# Patient Record
Sex: Female | Born: 1950
Health system: Southern US, Community
[De-identification: ages and names within clinical notes are randomized; demographics above are authoritative.]

## PROBLEM LIST (undated history)

## (undated) DIAGNOSIS — I2510 Atherosclerotic heart disease of native coronary artery without angina pectoris: Secondary | ICD-10-CM

## (undated) DIAGNOSIS — I739 Peripheral vascular disease, unspecified: Secondary | ICD-10-CM

## (undated) DIAGNOSIS — T4145XA Adverse effect of unspecified anesthetic, initial encounter: Secondary | ICD-10-CM

## (undated) DIAGNOSIS — E785 Hyperlipidemia, unspecified: Secondary | ICD-10-CM

## (undated) DIAGNOSIS — M79606 Pain in leg, unspecified: Secondary | ICD-10-CM

## (undated) DIAGNOSIS — IMO0001 Reserved for inherently not codable concepts without codable children: Secondary | ICD-10-CM

## (undated) DIAGNOSIS — Z720 Tobacco use: Secondary | ICD-10-CM

## (undated) DIAGNOSIS — I6529 Occlusion and stenosis of unspecified carotid artery: Secondary | ICD-10-CM

## (undated) DIAGNOSIS — Z87442 Personal history of urinary calculi: Secondary | ICD-10-CM

## (undated) DIAGNOSIS — I509 Heart failure, unspecified: Secondary | ICD-10-CM

## (undated) DIAGNOSIS — N2889 Other specified disorders of kidney and ureter: Secondary | ICD-10-CM

## (undated) DIAGNOSIS — D649 Anemia, unspecified: Secondary | ICD-10-CM

## (undated) DIAGNOSIS — J449 Chronic obstructive pulmonary disease, unspecified: Secondary | ICD-10-CM

## (undated) DIAGNOSIS — I10 Essential (primary) hypertension: Secondary | ICD-10-CM

## (undated) DIAGNOSIS — K219 Gastro-esophageal reflux disease without esophagitis: Secondary | ICD-10-CM

## (undated) DIAGNOSIS — R51 Headache: Secondary | ICD-10-CM

## (undated) DIAGNOSIS — T8859XA Other complications of anesthesia, initial encounter: Secondary | ICD-10-CM

## (undated) DIAGNOSIS — I340 Nonrheumatic mitral (valve) insufficiency: Secondary | ICD-10-CM

## (undated) DIAGNOSIS — C3491 Malignant neoplasm of unspecified part of right bronchus or lung: Secondary | ICD-10-CM

## (undated) HISTORY — DX: Tobacco use: Z72.0

## (undated) HISTORY — DX: Essential (primary) hypertension: I10

## (undated) HISTORY — DX: Hyperlipidemia, unspecified: E78.5

## (undated) HISTORY — DX: Heart failure, unspecified: I50.9

## (undated) HISTORY — DX: Headache: R51

## (undated) HISTORY — DX: Occlusion and stenosis of unspecified carotid artery: I65.29

## (undated) HISTORY — PX: ABDOMINAL HYSTERECTOMY: SHX81

## (undated) HISTORY — DX: Atherosclerotic heart disease of native coronary artery without angina pectoris: I25.10

## (undated) HISTORY — PX: RENAL ARTERY STENT: SHX2321

## (undated) HISTORY — DX: Malignant neoplasm of unspecified part of right bronchus or lung: C34.91

## (undated) HISTORY — DX: Pain in leg, unspecified: M79.606

## (undated) HISTORY — DX: Chronic obstructive pulmonary disease, unspecified: J44.9

## (undated) HISTORY — DX: Gastro-esophageal reflux disease without esophagitis: K21.9

## (undated) HISTORY — PX: COLONOSCOPY W/ POLYPECTOMY: SHX1380

## (undated) HISTORY — DX: Peripheral vascular disease, unspecified: I73.9

---

## 1999-04-20 ENCOUNTER — Emergency Department (HOSPITAL_COMMUNITY): Admission: EM | Admit: 1999-04-20 | Discharge: 1999-04-20 | Payer: Self-pay | Admitting: Emergency Medicine

## 1999-04-24 ENCOUNTER — Emergency Department (HOSPITAL_COMMUNITY): Admission: EM | Admit: 1999-04-24 | Discharge: 1999-04-25 | Payer: Self-pay | Admitting: Internal Medicine

## 2004-08-01 ENCOUNTER — Emergency Department: Payer: Self-pay | Admitting: Emergency Medicine

## 2005-06-13 ENCOUNTER — Ambulatory Visit: Payer: Self-pay | Admitting: Family Medicine

## 2006-02-07 ENCOUNTER — Ambulatory Visit: Payer: Self-pay | Admitting: Gastroenterology

## 2006-07-08 ENCOUNTER — Ambulatory Visit: Payer: Self-pay | Admitting: Obstetrics and Gynecology

## 2007-08-24 HISTORY — PX: CORONARY ANGIOPLASTY WITH STENT PLACEMENT: SHX49

## 2007-08-29 ENCOUNTER — Inpatient Hospital Stay (HOSPITAL_COMMUNITY): Admission: EM | Admit: 2007-08-29 | Discharge: 2007-08-31 | Payer: Self-pay | Admitting: Emergency Medicine

## 2007-09-10 ENCOUNTER — Inpatient Hospital Stay (HOSPITAL_COMMUNITY): Admission: AD | Admit: 2007-09-10 | Discharge: 2007-09-11 | Payer: Self-pay | Admitting: Interventional Cardiology

## 2007-09-10 ENCOUNTER — Inpatient Hospital Stay (HOSPITAL_BASED_OUTPATIENT_CLINIC_OR_DEPARTMENT_OTHER): Admission: RE | Admit: 2007-09-10 | Discharge: 2007-09-10 | Payer: Self-pay | Admitting: Cardiology

## 2007-10-01 ENCOUNTER — Ambulatory Visit (HOSPITAL_COMMUNITY): Admission: RE | Admit: 2007-10-01 | Discharge: 2007-10-02 | Payer: Self-pay | Admitting: Interventional Cardiology

## 2007-10-08 ENCOUNTER — Emergency Department (HOSPITAL_COMMUNITY): Admission: EM | Admit: 2007-10-08 | Discharge: 2007-10-08 | Payer: Self-pay | Admitting: Emergency Medicine

## 2007-11-09 ENCOUNTER — Ambulatory Visit: Payer: Self-pay | Admitting: Vascular Surgery

## 2007-11-20 ENCOUNTER — Ambulatory Visit: Payer: Self-pay | Admitting: Vascular Surgery

## 2007-11-20 ENCOUNTER — Encounter: Payer: Self-pay | Admitting: Vascular Surgery

## 2007-11-20 ENCOUNTER — Observation Stay (HOSPITAL_COMMUNITY): Admission: RE | Admit: 2007-11-20 | Discharge: 2007-11-21 | Payer: Self-pay | Admitting: Obstetrics and Gynecology

## 2007-11-21 HISTORY — PX: CAROTID ENDARTERECTOMY: SUR193

## 2007-12-08 ENCOUNTER — Ambulatory Visit: Payer: Self-pay | Admitting: Vascular Surgery

## 2008-03-25 HISTORY — PX: ANGIOPLASTY / STENTING ILIAC: SUR31

## 2008-06-14 ENCOUNTER — Ambulatory Visit: Payer: Self-pay | Admitting: Vascular Surgery

## 2008-09-28 ENCOUNTER — Encounter (INDEPENDENT_AMBULATORY_CARE_PROVIDER_SITE_OTHER): Payer: Self-pay | Admitting: *Deleted

## 2008-09-28 DIAGNOSIS — E785 Hyperlipidemia, unspecified: Secondary | ICD-10-CM

## 2008-09-28 DIAGNOSIS — Z9861 Coronary angioplasty status: Secondary | ICD-10-CM

## 2008-09-28 DIAGNOSIS — I739 Peripheral vascular disease, unspecified: Secondary | ICD-10-CM

## 2008-09-28 DIAGNOSIS — I10 Essential (primary) hypertension: Secondary | ICD-10-CM

## 2008-09-28 DIAGNOSIS — I2510 Atherosclerotic heart disease of native coronary artery without angina pectoris: Secondary | ICD-10-CM

## 2008-10-19 ENCOUNTER — Ambulatory Visit: Payer: Self-pay | Admitting: Family Medicine

## 2008-10-20 ENCOUNTER — Telehealth: Payer: Self-pay | Admitting: *Deleted

## 2008-10-24 ENCOUNTER — Ambulatory Visit: Payer: Self-pay | Admitting: Family Medicine

## 2008-10-24 ENCOUNTER — Encounter: Payer: Self-pay | Admitting: Family Medicine

## 2008-10-24 LAB — CONVERTED CEMR LAB
BUN: 13 mg/dL (ref 6–23)
CO2: 24 meq/L (ref 19–32)
Calcium: 10 mg/dL (ref 8.4–10.5)
Chloride: 109 meq/L (ref 96–112)
Cholesterol: 163 mg/dL (ref 0–200)
Creatinine, Ser: 0.98 mg/dL (ref 0.40–1.20)
Glucose, Bld: 88 mg/dL (ref 70–99)
HDL: 43 mg/dL (ref >39)
LDL Cholesterol: 97 mg/dL (ref 0–99)
Potassium: 3.8 meq/L (ref 3.5–5.3)
Sodium: 141 meq/L (ref 135–145)
Total CHOL/HDL Ratio: 3.8
Triglycerides: 116 mg/dL (ref <150)
VLDL: 23 mg/dL (ref 0–40)
Vit D, 25-Hydroxy: 17 ng/mL — ABNORMAL LOW (ref 30–89)

## 2008-10-25 ENCOUNTER — Encounter: Admission: RE | Admit: 2008-10-25 | Discharge: 2008-10-25 | Payer: Self-pay | Admitting: Family Medicine

## 2008-11-07 ENCOUNTER — Telehealth: Payer: Self-pay | Admitting: Family Medicine

## 2008-12-20 ENCOUNTER — Ambulatory Visit: Payer: Self-pay | Admitting: Vascular Surgery

## 2009-06-12 ENCOUNTER — Ambulatory Visit: Payer: Self-pay | Admitting: Family Medicine

## 2009-06-19 ENCOUNTER — Encounter (INDEPENDENT_AMBULATORY_CARE_PROVIDER_SITE_OTHER): Payer: Self-pay | Admitting: *Deleted

## 2009-06-20 ENCOUNTER — Encounter: Payer: Self-pay | Admitting: Family Medicine

## 2009-07-21 ENCOUNTER — Encounter (INDEPENDENT_AMBULATORY_CARE_PROVIDER_SITE_OTHER): Payer: Self-pay | Admitting: *Deleted

## 2009-09-21 ENCOUNTER — Ambulatory Visit: Payer: Self-pay | Admitting: Internal Medicine

## 2009-11-10 ENCOUNTER — Telehealth: Payer: Self-pay | Admitting: Family Medicine

## 2009-11-22 ENCOUNTER — Encounter: Payer: Self-pay | Admitting: Family Medicine

## 2009-11-22 ENCOUNTER — Ambulatory Visit: Payer: Self-pay | Admitting: Family Medicine

## 2009-11-22 LAB — CONVERTED CEMR LAB
HCT: 36.4 % (ref 36.0–46.0)
Hemoglobin: 11.8 g/dL — ABNORMAL LOW (ref 12.0–15.0)
MCHC: 32.4 g/dL (ref 30.0–36.0)
MCV: 84.8 fL (ref 78.0–100.0)
Platelets: 182 10*3/uL (ref 150–400)
RBC: 4.29 M/uL (ref 3.87–5.11)
RDW: 15.1 % (ref 11.5–15.5)
TSH: 1.81 microintl units/mL (ref 0.350–4.500)
WBC: 11.9 10*3/uL — ABNORMAL HIGH (ref 4.0–10.5)

## 2009-11-23 ENCOUNTER — Encounter: Payer: Self-pay | Admitting: Family Medicine

## 2009-12-05 ENCOUNTER — Encounter: Payer: Self-pay | Admitting: *Deleted

## 2009-12-05 ENCOUNTER — Ambulatory Visit: Payer: Self-pay | Admitting: Vascular Surgery

## 2010-03-01 ENCOUNTER — Telehealth: Payer: Self-pay | Admitting: Family Medicine

## 2010-03-06 ENCOUNTER — Telehealth: Payer: Self-pay | Admitting: Family Medicine

## 2010-04-15 ENCOUNTER — Encounter: Payer: Self-pay | Admitting: Family Medicine

## 2010-04-15 ENCOUNTER — Encounter: Payer: Self-pay | Admitting: Cardiology

## 2010-04-24 NOTE — Cardiovascular Report (Signed)
Summary: [facility]  [facility]   Imported By: Bradly Bienenstock 06/23/2009 15:53:28  _____________________________________________________________________  External Attachment:    Type:   Image     Comment:   External Document

## 2010-04-24 NOTE — Assessment & Plan Note (Signed)
Summary: new pt appt/AC   Vital Signs:  Patient profile:   60 year old female Height:      74.0 inches Weight:      195.8 pounds BMI:     25.23 Temp:     97.6 degrees F Pulse rate:   56 / minute BP sitting:   173 / 84  (left arm)  Vitals Entered By: Ardeen Garland  MD (October 19, 2008 10:25 AM) CC: np Is Patient Diabetic? No Pain Assessment Patient in pain? no        Primary Care Provider:  Helane Rima DO  CC:  np.  History of Present Illness: Leah [name] is a pleasant 60 year old AAF presenting for a new patient visit. Her chronic medical issues are as follows:  1. HTN: Rx: metoprolol, benicar, asa. BP elevated today at 173/84. she denies CP, SOB, N/V, HA, dizziness, vision changes, LE swelling. wonders if elevated BP due to back pain.  2. HLD: Rx: lipitor. FLP 10/24/08. chol 163, trig 116, HDL 43, LDL 97.  3. CAD: s/p PTCA, stenting of LAD and first diagonal 09/2007. no PHX MI. Rx: ASA, Pplavix  4. PVD: s/p bilateral renal artery stenting, right external iliac artery stenting, left carotid endarterectomy. Rx: ASA, Plavix  5. Tobacco Use: smokes 10 cig/day. has smoked since age 38. sometimes up to 1.5 ppd.  6. Health Maintenance: mammogram x 2 years ago, colonoscopy x 2 years ago at [facility] (+ polyps, due next year), pelvic exam last year (+ trichomonas, patient and partner treated), DEXA (s/p total hysterectomy in 1980s with no HRT), PAP not indicated.  Her acute issue today:  1. Back Pain: x 4-5 months, low back with burning down both legs when lying flat on back. She denies trauma, bowel or bladder incontinence, shooting pain down legs, leg weakness, PMHx of chronic steriod use, FamHx of osteoporosis. She endorses PMHx of total hysterectomy in 1980s with no HRT. The patient also works long hours standing/walking on cement floor without proper footwear. She has some relief with Exedrin for pain.    Habits & Providers  Alcohol-Tobacco-Diet     Tobacco  Status: current     Cigarette Packs/Day: 0.5  Comments: Previous PCP: Dr. Shelbie Hutching; records at [facility]  Current Medications (verified): 1)  Metoprolol Tartrate 25 Mg Tabs (Metoprolol Tartrate) .... Take 1 Tablet By Mouth Two Times A Day 2)  Lipitor 40 Mg Tabs (Atorvastatin Calcium) .... Take 1 Tablet By Mouth Once Daily 3)  Benicar Hct 20-12.5 Mg Tabs (Olmesartan Medoxomil-Hctz) .... Take 1 Tablet By Mouth Once Daily 4)  Bayer Aspirin 325 Mg Tabs (Aspirin) .... Take 1 Tablet By Mouth Once Daily 5)  Nitroglycerin 0.4 Mg Subl (Nitroglycerin) .... Use As Directed, As Needed 6)  Plavix 75 Mg Tabs (Clopidogrel Bisulfate) .... Take 1 Tab By Mouth Daily  Allergies (verified): No Known Drug Allergies  Past History:  Past Medical History: Colonic polyps, hx of - 2006 Coronary artery disease - stents placement 2009 Hyperlipidemia Hypertension Peripheral vascular disease-  right ext. iliac artery stent placement Stent placement both kidneys Left carotid endarterectomy 2010  Past Surgical History: Caesarean section - 1986 Colon polypectomy - 2006 Cardiac angioplasty with stent placemen t- 2009 Hysterectomy due to fibroids/bleeding- 1980s PMH-FH-SH reviewed-no changes except otherwise noted  Social History: Pt lives with fiance in [place], works full time as Engineer, mining for [employer].  Smokes  ~10 cigarettes per day (has smoked up to 1.5 ppd since age 17),  drinks 2-3 alcoholic beverages occassionally on weekends, uses no illicit drugs. She has 4 children.Smoking Status:  current Packs/Day:  0.5  Review of Systems       per HPI, otherwise negative  Physical Exam  General:  alert, well-developed, well-nourished, and well-hydrated.   Lungs:  CTAB Heart:  RRR no m/r/g Msk:  ttp along spinus processes lumbar spine with hypertonic paraspinal mm, negative SLR bilaterally, normal strength and reflexes LE. no s/s reproduced with extension. some ttp SI joints. Pulses:  2+  dp Extremities:  no edema Neurologic:  strength normal in all extremities and gait normal.   Psych:  Oriented X3, memory intact for recent and remote, normally interactive, good eye contact, and not anxious appearing.     Impression & Recommendations:  Problem # 1:  BACK PAIN (ICD-724.5) Assessment New No RED FLAGS noted. Still considering back pain acute flare, likely MSK in nature, but there may be a OA component. Will Rx: Tylenol scheduled with Naproxyn for breakthrough pain. Advised light exercise and stretching as well as proper footwear during work. Will check DEXA and vitamin D as patient  > 15 years out of total hysterectomy with no HRT. Follow up in 3-4 weeks if no improvement. Her updated medication list for this problem includes:    Bayer Aspirin 325 Mg Tabs (Aspirin) .Marland Kitchen... Take 1 tablet by mouth once daily  Orders: Dexa scan (Dexa scan)Future Orders: Vit D, 25 [facility] ([id number]) ... 10/24/2008  Problem # 2:  HYPERTENSION (ICD-401.9) Assessment: Deteriorated No change to medications today. Unclear if elevated BP due to pain. Will have patient return for recheck. Will also work on changing medicines to $4 plan.  Her updated medication list for this problem includes:    Metoprolol Tartrate 25 Mg Tabs (Metoprolol tartrate) .Marland Kitchen... Take 1 tablet by mouth two times a day    Benicar Hct 20-12.5 Mg Tabs (Olmesartan medoxomil-hctz) .Marland Kitchen... Take 1 tablet by mouth once daily  Future Orders: Basic Met-[facility] ([id number]) ... 10/24/2008  Problem # 3:  HYPERLIPIDEMIA (ICD-272.4) Assessment: Unchanged Will recheck FLP.  Her updated medication list for this problem includes:    Lipitor 40 Mg Tabs (Atorvastatin calcium) .Marland Kitchen... Take 1 tablet by mouth once daily  Problem # 4:  CORONARY ARTERY DISEASE (ICD-414.00) Assessment: Unchanged Continue current treatment. Encouraged smoking cessation. Her updated medication list for this problem includes:    Metoprolol Tartrate 25 Mg Tabs  (Metoprolol tartrate) .Marland Kitchen... Take 1 tablet by mouth two times a day    Benicar Hct 20-12.5 Mg Tabs (Olmesartan medoxomil-hctz) .Marland Kitchen... Take 1 tablet by mouth once daily    Bayer Aspirin 325 Mg Tabs (Aspirin) .Marland Kitchen... Take 1 tablet by mouth once daily    Nitroglycerin 0.4 Mg Subl (Nitroglycerin) ..... Use as directed, as needed    Plavix 75 Mg Tabs (Clopidogrel bisulfate) .Marland Kitchen... Take 1 tab by mouth daily  Future Orders: Lipid-[facility] ([id number]) ... 10/24/2008  Problem # 5:  PERIPHERAL VASCULAR DISEASE (ICD-443.9) Assessment: Unchanged Continue current treatment. Her updated medication list for this problem includes:    Plavix 75 Mg Tabs (Clopidogrel bisulfate) .Marland Kitchen... Take 1 tab by mouth daily  Problem # 6:  SMOKER (ICD-305.1) Assessment: Unchanged Encouraged cessation. Patient not ready to quit at this time. Will continue to monitor and encourage.  Complete Medication List: 1)  Metoprolol Tartrate 25 Mg Tabs (Metoprolol tartrate) .... Take 1 tablet by mouth two times a day 2)  Lipitor 40 Mg Tabs (Atorvastatin calcium) .... Take 1 tablet by mouth once daily 3)  Benicar Hct 20-12.5 Mg Tabs (Olmesartan medoxomil-hctz) .... Take 1 tablet by mouth once daily 4)  Bayer Aspirin 325 Mg Tabs (Aspirin) .... Take 1 tablet by mouth once daily 5)  Nitroglycerin 0.4 Mg Subl (Nitroglycerin) .... Use as directed, as needed 6)  Plavix 75 Mg Tabs (Clopidogrel bisulfate) .... Take 1 tab by mouth daily  Patient Instructions: 1)  It was great to meet you today! 2)  We will send you to get a bone scan to evaluate for osteoporosis. 3)  We will request records from your previous PCP. 4)  Take Tylenol Extra Strenth (1000mg ) three times daily scheduled. 5)  You may also take Naproxyn for pain. 6)  Let me know if you do not feel better in 3-4 weeks. Prescriptions: BENICAR HCT 20-12.5 MG TABS (OLMESARTAN MEDOXOMIL-HCTZ) take 1 tablet by mouth once daily  #30 x 0   Entered and Authorized by:   Helane Rima MD    Signed by:   Helane Rima MD on 10/20/2008   Method used:   Electronically to        [facility]  [phone]* (retail)       [address]       [place], [place]  [address]       Ph: [phone] or [phone]       Fax: [phone]   RxID:   [id number]   Appended Document: Orders Update    Clinical Lists Changes  Medications: Changed medication from BENICAR HCT 20-12.5 MG TABS (OLMESARTAN MEDOXOMIL-HCTZ) take 1 tablet by mouth once daily to LISINOPRIL-HYDROCHLOROTHIAZIDE 20-12.5 MG TABS (LISINOPRIL-HYDROCHLOROTHIAZIDE) one by mouth daily - Signed Changed medication from LIPITOR 40 MG TABS (ATORVASTATIN CALCIUM) take 1 tablet by mouth once daily to PRAVASTATIN SODIUM 40 MG TABS (PRAVASTATIN SODIUM) one by mouth daily - Signed Rx of LISINOPRIL-HYDROCHLOROTHIAZIDE 20-12.5 MG TABS (LISINOPRIL-HYDROCHLOROTHIAZIDE) one by mouth daily;  #30 x 3;  Signed;  Entered by: Helane Rima MD;  Authorized by: Helane Rima MD;  Method used: Historical Rx of PRAVASTATIN SODIUM 40 MG TABS (PRAVASTATIN SODIUM) one by mouth daily;  #30 x 3;  Signed;  Entered by: Helane Rima MD;  Authorized by: Helane Rima MD;  Method used: Historical    Prescriptions: PRAVASTATIN SODIUM 40 MG TABS (PRAVASTATIN SODIUM) one by mouth daily  #30 x 3   Entered and Authorized by:   Helane Rima MD   Signed by:   Helane Rima MD on 11/02/2008   Method used:   Historical   RxID:   [id number] LISINOPRIL-HYDROCHLOROTHIAZIDE 20-12.5 MG TABS (LISINOPRIL-HYDROCHLOROTHIAZIDE) one by mouth daily  #30 x 3   Entered and Authorized by:   Helane Rima MD   Signed by:   Helane Rima MD on 11/02/2008   Method used:   Historical   RxID:   [id number]    Appended Document: new pt appt/AC Patient requested that medications be changed to $4 [facility] medications. Pravastatin to replace Lipitor. Lisinopril/HCTZ to replace Benicar/HCTZ.

## 2010-04-24 NOTE — Letter (Signed)
Summary: [facility]  [address] [place], [place] [address]   Phone: [phone]  Fax: [phone]    07/21/2009  [facility] [address] [place], [place]  [address] DOB: [birth year]  To:   [facility]     Health Information Dept.   [phone] (f)  Please fax any medical records pertaining to Colonoscopy and/or EGD and any related pathology.  Patient states these records would be between 2005-2011.  Please fax records to [phone], Attn: Stephanie.  Sincerely,    Francee Piccolo CMA [facility]) [phone] Extention

## 2010-04-24 NOTE — Letter (Signed)
Summary: New Patient letter  [facility]  [address] [place], [place] [address]   Phone: [phone]  Fax: [phone]       06/19/2009 MRN: [id number]  [facility] [address] [place], [place]  [address]  Dear Ms. Barnabas Lister,  Welcome to the Gastroenterology Division at [facility].    You are scheduled to see Dr. Leone Payor on 07-24-09 at 9:00a.m. on the 3rd floor at [facility], [address]  We ask that you try to arrive at our office 15 minutes prior to your appointment time to allow for check-in.  We would like you to complete the enclosed self-administered evaluation form prior to your visit and bring it with you on the day of your appointment.  We will review it with you.  Also, please bring a complete list of all your medications or, if you prefer, bring the medication bottles and we will list them.  Please bring your insurance card so that we may make a copy of it.  If your insurance requires a referral to see a specialist, please bring your referral form from your primary care physician.  Co-payments are due at the time of your visit and may be paid by cash, check or credit card.     Your office visit will consist of a consult with your physician (includes a physical exam), any laboratory testing he/she may order, scheduling of any necessary diagnostic testing (e.g. x-ray, ultrasound, CT-scan), and scheduling of a procedure (e.g. Endoscopy, Colonoscopy) if required.  Please allow enough time on your schedule to allow for any/all of these possibilities.    If you cannot keep your appointment, please call [phone] to cancel or reschedule prior to your appointment date.  This allows [place] the opportunity to schedule an appointment for another patient in need of care.  If you do not cancel or reschedule by 5 p.m. the business day prior to your appointment date, you will be charged a $50.00 late cancellation/no-show fee.    Thank you for  choosing [facility] for your medical needs.  We appreciate the opportunity to care for you.  Please visit [place] at our website  to learn more about our practice.                     Sincerely,                                                             The Gastroenterology Division

## 2010-04-24 NOTE — Progress Notes (Signed)
Summary: refill  Phone Note Refill Request Call back at Home Phone [phone] Message from:  Patient  Refills Requested: Medication #1:  METOPROLOL TARTRATE 25 MG TABS Take 1 tablet by mouth two times a day  Medication #2:  PRAVASTATIN SODIUM 40 MG TABS one by mouth daily  Medication #3:  LISINOPRIL-HYDROCHLOROTHIAZIDE 20-12.5 MG TABS one by mouth daily  Medication #4:  PLAVIX 75 MG TABS Take 1 tab by mouth daily. p states that pharmacy has been trying to reach [place]. she is now out of meds.  Initial call taken by: De Nurse,  November 07, 2008 10:36 AM    Prescriptions: PLAVIX 75 MG TABS (CLOPIDOGREL BISULFATE) Take 1 tab by mouth daily  #30 x 11   Entered and Authorized by:   Helane Rima MD   Signed by:   Helane Rima MD on 11/07/2008   Method used:   Electronically to        [facility] (retail)       [address]       [place], [place]  [address]       Ph: [phone]       Fax: [phone]   RxID:   [id number] LISINOPRIL-HYDROCHLOROTHIAZIDE 20-12.5 MG TABS (LISINOPRIL-HYDROCHLOROTHIAZIDE) one by mouth daily  #90 x 3   Entered and Authorized by:   Helane Rima MD   Signed by:   Helane Rima MD on 11/07/2008   Method used:   Electronically to        [facility] (retail)       [address]       [place], [place]  [address]       Ph: [phone]       Fax: [phone]   RxID:   [id number] PRAVASTATIN SODIUM 40 MG TABS (PRAVASTATIN SODIUM) one by mouth daily  #90 x 3   Entered and Authorized by:   Helane Rima MD   Signed by:   Helane Rima MD on 11/07/2008   Method used:   Electronically to        [facility] (retail)       [address]       [place], [place]  [address]       Ph: [phone]       Fax: [phone]   RxID:   [id number] METOPROLOL TARTRATE 25 MG TABS (METOPROLOL TARTRATE) Take 1 tablet by mouth two times a day  #180 x 3  Entered and Authorized by:   Helane Rima MD   Signed by:   Helane Rima MD on 11/07/2008   Method used:   Electronically to        [facility] (retail)       [address]       [place], [place]  [address]       Ph: [phone]       Fax: [phone]   RxID:   [id number]

## 2010-04-24 NOTE — Assessment & Plan Note (Signed)
Summary: f/u labs,df   Vital Signs:  Patient profile:   60 year old female Weight:      182.2 pounds BMI:     23.48 Temp:     98.9 degrees F Pulse rate:   79 / minute BP sitting:   144 / 78  (left arm)  Vitals Entered By: Starleen Blue RN (November 22, 2009 1:35 PM) CC: Labs, Rash, Fatigue Is Patient Diabetic? No Pain Assessment Patient in pain? no        Primary Care Provider:  Helane Rima DO  CC:  Labs, Rash, and Fatigue.  History of Present Illness: 60 yo F, that had labwork done at work earlier this month, presenting to discuss the results.  1. Labs: See Flowsheet. Fasting BG 101 with A1c 6.2. Discussed preDM. Patient okay with making appointment to see nutritionist with PCP available.  2. Rash: right antecubital fossa, x 3-4 days, no other places, no Hx eczema.  3. Fatigue: x several months, statest that she has no "get up and go." See ROS.  Habits & Providers  Alcohol-Tobacco-Diet     Tobacco Status: current     Cigarette Packs/Day: 0.5  Current Medications (verified): 1)  Metoprolol Tartrate 25 Mg Tabs (Metoprolol Tartrate) .... Take 1 Tablet By Mouth Two Times A Day 2)  Pravastatin Sodium 40 Mg Tabs (Pravastatin Sodium) .... One By Mouth Daily 3)  Lisinopril-Hydrochlorothiazide 20-12.5 Mg Tabs (Lisinopril-Hydrochlorothiazide) .... One By Mouth Daily 4)  Bayer Aspirin 325 Mg Tabs (Aspirin) .... Take 1 Tablet By Mouth Once Daily 5)  Nitroglycerin 0.4 Mg Subl (Nitroglycerin) .... Use As Directed, As Needed 6)  Plavix 75 Mg Tabs (Clopidogrel Bisulfate) .... Take 1 Tab By Mouth Daily 7)  Ultram 50 Mg Tabs (Tramadol Hcl) .... One By Mouth Two Times A Day As Needed Moderate Pain 8)  Triamcinolone Acetonide 0.5 % Crea (Triamcinolone Acetonide) .... Apply Bid To Affected Area  Allergies (verified): No Known Drug Allergies PMH-FH-SH reviewed for relevance  Review of Systems General:  Denies chills and fever. CV:  Denies chest pain or discomfort, shortness of  breath with exertion, and swelling of feet. GI:  Denies abdominal pain, constipation, diarrhea, nausea, and vomiting. Derm:  Complains of dryness, itching, and rash; denies lesion(s). Psych:  Denies anxiety and depression. Endo:  Denies cold intolerance, excessive hunger, excessive thirst, excessive urination, heat intolerance, and weight change. Heme:  Denies bleeding.  Physical Exam  General:  Alert, well-developed, well-nourished, and well-hydrated.  Vitals reviewed. Lungs:  CTAB, no wheeze. Heart:  RRR no m/r/g. Abdomen:  Bowel sounds positive,abdomen soft and non-tender without masses, organomegaly or hernias noted. Extremities:  No edema. Skin:  Right antecubital fossa with dry patch, with multiple excoriations. No open lesions.  Psych:  Oriented X3, memory intact for recent and remote, normally interactive, good eye contact, not anxious appearing, and not depressed appearing.     Impression & Recommendations:  Problem # 1:  PREDIABETES (ICD-790.29) Assessment New  Discussed Dx. Patient agreed to follow up with Dr. Gerilyn Pilgrim. I will be working with her next month, so we will schedule at time for the 3 of [place] to meet.  Orders: [facility]- Est  Level 4 ([address])  Problem # 2:  SKIN RASH (ICD-782.1) Assessment: New  Unclear etiology. Will treat with below. Her updated medication list for this problem includes:    Triamcinolone Acetonide 0.5 % Crea (Triamcinolone acetonide) .Marland Kitchen... Apply bid to affected area  Orders: [facility]- Est  Level 4 ([address])  Problem # 3:  FATIGUE (ICD-780.79) Assessment: Unchanged  Orders: CBC-[facility] ([address]) TSH-[facility] ([id number]) [facility]- Est  Level 4 ([address])  Complete Medication List: 1)  Metoprolol Tartrate 25 Mg Tabs (Metoprolol tartrate) .... Take 1 tablet by mouth two times a day 2)  Pravastatin Sodium 40 Mg Tabs (Pravastatin sodium) .... One by mouth daily 3)  Lisinopril-hydrochlorothiazide 20-12.5 Mg Tabs (Lisinopril-hydrochlorothiazide) .... One by mouth  daily 4)  Bayer Aspirin 325 Mg Tabs (Aspirin) .... Take 1 tablet by mouth once daily 5)  Nitroglycerin 0.4 Mg Subl (Nitroglycerin) .... Use as directed, as needed 6)  Plavix 75 Mg Tabs (Clopidogrel bisulfate) .... Take 1 tab by mouth daily 7)  Ultram 50 Mg Tabs (Tramadol hcl) .... One by mouth two times a day as needed moderate pain 8)  Triamcinolone Acetonide 0.5 % Crea (Triamcinolone acetonide) .... Apply bid to affected area  Patient Instructions: 1)  It was nice to see you today! 2)  I have sent your prescriptions to the pharmacy. 3)  We will call with your Nutritionist Appointment. Prescriptions: PLAVIX 75 MG TABS (CLOPIDOGREL BISULFATE) Take 1 tab by mouth daily  #90 x 3   Entered and Authorized by:   Helane Rima DO   Signed by:   Helane Rima DO on 11/22/2009   Method used:   Print then Give to Patient   RxID:   [id number] TRIAMCINOLONE ACETONIDE 0.5 % CREA (TRIAMCINOLONE ACETONIDE) apply bid to affected area  #1 small tube x 0   Entered and Authorized by:   Helane Rima DO   Signed by:   Helane Rima DO on 11/22/2009   Method used:   Electronically to        [facility] (retail)       [address]       [place], [place]  [address]       Ph: [phone]       Fax: [phone]   RxID:   [id number]

## 2010-04-24 NOTE — Progress Notes (Signed)
Summary: refill  Phone Note Refill Request Call back at [phone] Message from:  Patient  Refills Requested: Medication #1:  LISINOPRIL-HYDROCHLOROTHIAZIDE 20-12.5 MG TABS one by mouth daily  Medication #2:  ULTRAM 50 MG TABS ONE by mouth two times a day as needed moderate pain Initial call taken by: De Nurse,  November 10, 2009 11:13 AM    Prescriptions: ULTRAM 50 MG TABS (TRAMADOL HCL) ONE by mouth two times a day as needed moderate pain  #60 x 1   Entered and Authorized by:   Helane Rima DO   Signed by:   Helane Rima DO on 11/13/2009   Method used:   Electronically to        [facility] (retail)       [address]       [place], [place]  [address]       Ph: [phone]       Fax: [phone]   RxID:   [id number] LISINOPRIL-HYDROCHLOROTHIAZIDE 20-12.5 MG TABS (LISINOPRIL-HYDROCHLOROTHIAZIDE) one by mouth daily  #90 x 3   Entered and Authorized by:   Helane Rima DO   Signed by:   Helane Rima DO on 11/13/2009   Method used:   Electronically to        [facility] (retail)       [address]       [place], [place]  [address]       Ph: [phone]       Fax: [phone]   RxID:   [id number]

## 2010-04-24 NOTE — Progress Notes (Signed)
  Phone Note Refill Request   Refills Requested: Medication #1:  ULTRAM 50 MG TABS ONE by mouth two times a day as needed moderate pain Need refill on Tramadol rx  Initial call taken by: Abundio Miu,  March 01, 2010 3:12 PM    Prescriptions: ULTRAM 50 MG TABS (TRAMADOL HCL) ONE by mouth two times a day as needed moderate pain  #60 x 1   Entered and Authorized by:   Helane Rima DO   Signed by:   Helane Rima DO on 03/01/2010   Method used:   Electronically to        [facility] (retail)       [address]       [place], [place]  [address]       Ph: [phone]       Fax: [phone]   RxID:   [id number]

## 2010-04-24 NOTE — Miscellaneous (Signed)
Summary: need to schedule nutrition appointment  Clinical Lists Changes  message left on voicemail to call back. need to schedule appointment with Dr. Gerilyn Pilgrim for any Thursday this month per order form Dr. Earlene Plater. Theresia Lo RN  December 05, 2009 9:06 AM  Pt returned call and made appt with Dr. Gerilyn Pilgrim for 12/07/09.

## 2010-04-24 NOTE — Letter (Signed)
Summary: Generic Letter  [facility]  [address]   [place], [place] [address]   Phone: [phone]  Fax: [phone]    11/23/2009  Leah [name] [address] [place], [place]  [address]  Dear Ms. HOLLICK,  I am happy to inform you that your recent labs were all normal.   Sincerely,   Helane Rima DO  Appended Document: Generic Letter mailed

## 2010-04-24 NOTE — Assessment & Plan Note (Signed)
Summary: back pain/eo   Vital Signs:  Patient profile:   60 year old female Height:      74.0 inches Weight:      186.9 pounds BMI:     24.08 Pulse rate:   75 / minute BP sitting:   156 / 81  (left arm)  Vitals Entered By: Arlyss Repress CMA, (June 12, 2009 1:37 PM) CC: f/up low back pain and bone density test Is Patient Diabetic? No Pain Assessment Patient in pain? yes     Location: lower back Intensity: 7 Onset of pain  x several months   Primary Care Provider:  Helane Rima DO  CC:  f/up low back pain and bone density test.  History of Present Illness: 60 year old AAF:  1. Back Pain: x several months, low back with burning down both legs when lying flat on back. She denies trauma, bowel or bladder incontinence, shooting pain down legs, leg weakness, PMHx of chronic steriod use, FamHx of osteoporosis. She endorses PMHx of total hysterectomy in 1980s with no HRT. The patient also works long hours standing/walking on cement floor without proper footwear. She has mild relief with Tylenol for pain. Recent labs - vitamin D 17. Bone density show low bone density but no osteoporosis.  Habits & Providers  Alcohol-Tobacco-Diet     Tobacco Status: current     Tobacco Counseling: to quit use of tobacco products  Current Medications (verified): 1)  Metoprolol Tartrate 25 Mg Tabs (Metoprolol Tartrate) .... Take 1 Tablet By Mouth Two Times A Day 2)  Pravastatin Sodium 40 Mg Tabs (Pravastatin Sodium) .... One By Mouth Daily 3)  Lisinopril-Hydrochlorothiazide 20-12.5 Mg Tabs (Lisinopril-Hydrochlorothiazide) .... One By Mouth Daily 4)  Bayer Aspirin 325 Mg Tabs (Aspirin) .... Take 1 Tablet By Mouth Once Daily 5)  Nitroglycerin 0.4 Mg Subl (Nitroglycerin) .... Use As Directed, As Needed 6)  Plavix 75 Mg Tabs (Clopidogrel Bisulfate) .... Take 1 Tab By Mouth Daily 7)  Ultram 50 Mg Tabs (Tramadol Hcl) .... One By Mouth Two Times A Day As Needed Moderate Pain 8)  Ergocalciferol 50000 Unit  Caps (Ergocalciferol) .... One By Mouth Weekly X 12 Weeks  Allergies (verified): No Known Drug Allergies  Past History:  Past Medical History: Colonic polyps, hx of - 2006 Coronary artery disease - stents placement 2009 Hyperlipidemia Hypertension Peripheral vascular disease - right ext. iliac artery stent placement Stent placement both kidneys Left carotid endarterectomy 2010 Low vitamin D DEXA (2010): - 1.4  Review of Systems General:  Denies chills and fever. CV:  Complains of shortness of breath with exertion; denies chest pain or discomfort and swelling of feet. Resp:  Denies cough, pleuritic, and wheezing. MS:  Complains of low back pain. Neuro:  Denies numbness and tingling.  Physical Exam  General:  Alert, well-developed, well-nourished, and well-hydrated.  Vitals reviewed. Lungs:  CTAB, no wheeze Heart:  RRR no m/r/g Msk:  ttp along spinus processes lumbar spine with hypertonic paraspinal mm, negative SLR bilaterally, normal strength and reflexes LE. no s/s reproduced with extension. some ttp SI joints. Extremities:  no edema Neurologic:  strength normal in all extremities and gait normal.   Psych:  Oriented X3, memory intact for recent and remote, normally interactive, good eye contact, and not anxious appearing.     Impression & Recommendations:  Problem # 1:  BACK PAIN (ICD-724.5) Assessment Unchanged No red flags. Reviewed importance of proper shoes while working. Will Rx Ulram for moderate - severe pain. Red Flags given.  Her updated medication list for this problem includes:    Bayer Aspirin 325 Mg Tabs (Aspirin) .Marland Kitchen... Take 1 tablet by mouth once daily    Ultram 50 Mg Tabs (Tramadol hcl) ..... One by mouth two times a day as needed moderate pain  Orders: [facility]- Est  Level 4 (84696)  Problem # 2:  VITAMIN D DEFICIENCY (ICD-268.9) Assessment: New High dose supplementation x 12 weeks. Orders: [facility]- Est  Level 4 (29528)  Problem # 3:  TOBACCO ABUSE  (ICD-305.1) Assessment: Unchanged Encouraged cessation. Orders: [facility]- Est  Level 4 (41324)  Complete Medication List: 1)  Metoprolol Tartrate 25 Mg Tabs (Metoprolol tartrate) .... Take 1 tablet by mouth two times a day 2)  Pravastatin Sodium 40 Mg Tabs (Pravastatin sodium) .... One by mouth daily 3)  Lisinopril-hydrochlorothiazide 20-12.5 Mg Tabs (Lisinopril-hydrochlorothiazide) .... One by mouth daily 4)  Bayer Aspirin 325 Mg Tabs (Aspirin) .... Take 1 tablet by mouth once daily 5)  Nitroglycerin 0.4 Mg Subl (Nitroglycerin) .... Use as directed, as needed 6)  Plavix 75 Mg Tabs (Clopidogrel bisulfate) .... Take 1 tab by mouth daily 7)  Ultram 50 Mg Tabs (Tramadol hcl) .... One by mouth two times a day as needed moderate pain 8)  Ergocalciferol 50000 Unit Caps (Ergocalciferol) .... One by mouth weekly x 12 weeks  Other Orders: Colonoscopy (Colon)  Patient Instructions: 1)  It was nice to see you today! 2)  I have sent your prescriptions to the pharmacy. 3)  We will call with your colonoscopy information. Prescriptions: ERGOCALCIFEROL 50000 UNIT CAPS (ERGOCALCIFEROL) one by mouth WEEKLY x 12 weeks  #12 x 0   Entered and Authorized by:   Helane Rima DO   Signed by:   Helane Rima DO on 06/12/2009   Method used:   Electronically to        [facility] (retail)       [address]       [place], [place]  [address]       Ph: [phone]       Fax: [phone]   RxID:   [id number] ULTRAM 50 MG TABS (TRAMADOL HCL) ONE by mouth two times a day as needed moderate pain  #60 x 3   Entered and Authorized by:   Helane Rima DO   Signed by:   Helane Rima DO on 06/12/2009   Method used:   Electronically to        [facility] (retail)       [address]       [place], [place]  [address]       Ph: [phone]       Fax: [phone]   RxID:   [id number]   Prevention & Chronic Care Immunizations   Influenza  vaccine: Not documented   Influenza vaccine deferral: Not indicated  (06/12/2009)    Tetanus booster: Not documented   Td booster deferral: Not indicated  (06/12/2009)    Pneumococcal vaccine: Not documented  Colorectal Screening   Hemoccult: Not documented   Hemoccult action/deferral: Not indicated  (06/12/2009)    Colonoscopy: Not documented   Colonoscopy action/deferral: GI Referral  (06/12/2009)  Other Screening   Pap smear: Not documented   Pap smear action/deferral: Not indicated S/P hysterectomy  (06/12/2009)    Mammogram: Not documented   Mammogram action/deferral: Ordered  (06/12/2009)   Smoking status: current  (06/12/2009)  Smoking cessation counseling: yes  (06/12/2009)  Lipids   Total Cholesterol: 163  (10/24/2008)   LDL: 97  (10/24/2008)   LDL Direct: Not documented   HDL: 43  (10/24/2008)   Triglycerides: 116  (10/24/2008)    SGOT (AST): Not documented   SGPT (ALT): Not documented   Alkaline phosphatase: Not documented   Total bilirubin: Not documented    Lipid flowsheet reviewed?: Yes   Progress toward LDL goal: At goal  Hypertension   Last Blood Pressure: 156 / 81  (06/12/2009)   Serum creatinine: 0.98  (10/24/2008)   Serum potassium 3.8  (10/24/2008)    Hypertension flowsheet reviewed?: Yes   Progress toward BP goal: Improved  Self-Management Support :   Personal Goals (by the next clinic visit) :      Personal blood pressure goal: 130/80  (06/12/2009)     Personal LDL goal: 70  (06/12/2009)    Patient will work on the following items until the next clinic visit to reach self-care goals:     Medications and monitoring: take my medicines every day  (06/12/2009)     Eating: drink diet soda or water instead of juice or soda, eat more vegetables, use fresh or frozen vegetables, eat foods that are low in salt, eat baked foods instead of fried foods, eat fruit for snacks and desserts  (06/12/2009)     Activity: take a 30 minute walk every day,  take the stairs instead of the elevator, park at the far end of the parking lot  (06/12/2009)    Hypertension self-management support: Written self-care plan  (06/12/2009)   Hypertension self-care plan printed.    Lipid self-management support: Written self-care plan  (06/12/2009)   Lipid self-care plan printed.   Nursing Instructions: Screening colonoscopy ordered

## 2010-04-26 NOTE — Progress Notes (Signed)
Summary: UPDATE PROBLEM LIST   

## 2010-05-18 ENCOUNTER — Encounter: Payer: Self-pay | Admitting: Family Medicine

## 2010-05-18 ENCOUNTER — Ambulatory Visit (INDEPENDENT_AMBULATORY_CARE_PROVIDER_SITE_OTHER): Payer: 59 | Admitting: Family Medicine

## 2010-05-18 VITALS — BP 108/67 | HR 68 | Wt 184.0 lb

## 2010-05-18 DIAGNOSIS — M545 Low back pain, unspecified: Secondary | ICD-10-CM

## 2010-05-18 MED ORDER — NORTRIPTYLINE HCL 10 MG PO CAPS: 10.0000 mg | ORAL_CAPSULE | Freq: Every day | ORAL | Status: DC

## 2010-05-18 MED ORDER — HYDROCODONE-ACETAMINOPHEN 7.5-500 MG PO TABS: 1.0000 | ORAL_TABLET | ORAL | Status: DC | PRN

## 2010-05-18 NOTE — Patient Instructions (Signed)
It was nice to see you today!  Follow up in 4-6 weeks if you are not feeling better or come in sooner if your feel worse.

## 2010-05-21 ENCOUNTER — Encounter: Payer: Self-pay | Admitting: Family Medicine

## 2010-05-21 NOTE — Progress Notes (Signed)
  Subjective:    Leah [name] is a 60 y.o. female who presents for evaluation of low back pain. The patient has had recurrent self limited episodes of low back pain in the past. Symptoms have been present for a few weeks and are unchanged.  Onset was related to / precipitated by no known injury. The pain is located in the across the lower back or radiating to left leg(s) and radiates to the left lower leg. The pain is described as aching and sharp and occurs intermittently. She rates her pain as moderate. Symptoms are exacerbated by flexion, lifting and twisting. Symptoms are improved by rest. She has also tried acetaminophen, change in body position, heat, NSAIDs and rest which provided no symptom relief. She has no other symptoms associated with the back pain. The patient has no "red flag" history indicative of complicated back pain.  The following portions of the patient's history were reviewed and updated as appropriate: past family history, past medical history, past social history and past surgical history.  Review of Systems Pertinent items are noted in HPI.    Objective:  VS Reviewed. General: NAD. A&O x 3.  Back Exam: Inspection: Lumbar lordosis. No other abnormalities. Motion: FROM. SLR seated: Negative.                     SLR lying: Negative. Palpable tenderness: Left lumbar paraspinal mm. No TTP along spinus processes. FABER: Negative. Sensory change: No. Reflex change: No.  Strength at foot: Plantar-flexion: 5 / 5    Dorsi-flexion: 5 / 5    Eversion: 5 / 5   Inversion: 5 / 5 Leg strength Quad: 5 / 5   Hamstring: 5 / 5   Hip flexor: 5 / 5   Hip abductors: 5 / 5    Assessment:    Low Back Pain with Radiculopathy  Plan:    Natural history and expected course discussed. Questions answered. Proper lifting, bending technique discussed. Regular aerobic and trunk strengthening exercises discussed. Heat to affected area as needed for local pain relief. NSAIDs per  medication orders. Muscle relaxants per medication orders. Follow-up in 6 weeks.

## 2010-06-15 ENCOUNTER — Other Ambulatory Visit: Payer: Self-pay | Admitting: Family Medicine

## 2010-06-15 ENCOUNTER — Telehealth: Payer: Self-pay | Admitting: Family Medicine

## 2010-06-15 DIAGNOSIS — M79605 Pain in left leg: Secondary | ICD-10-CM

## 2010-06-15 MED ORDER — HYDROCODONE-ACETAMINOPHEN 7.5-500 MG PO TABS: 1.0000 | ORAL_TABLET | ORAL | Status: DC | PRN

## 2010-06-15 NOTE — Telephone Encounter (Signed)
Printed and ready for pick up. 

## 2010-06-15 NOTE — Telephone Encounter (Signed)
Needs refill on hydrocodone - please call when ready

## 2010-06-19 ENCOUNTER — Telehealth: Payer: Self-pay | Admitting: Family Medicine

## 2010-06-19 NOTE — Telephone Encounter (Signed)
Patient fell down about 4 to 5 steps a week ago this past Sunday.  Denies hitting her head.  Said that she fell directly on her thigh.  Was able to walk on it afterwards and has been walking in it since.  The Saturday after she fell she developed a bruise on that thigh which is now purple.  Describes a red spot in the middle of the bruise that has a knot under it.  Patient takes Plavix.  She feels that she needs to be see.  Told her we did not have any openings today.  Discussed this with Dr. Earlene Plater who feels that she can wait until tomorrow am to be seen as a work in.  Made appointment for patient but advised her that if she experiences any SOB or CP she needs to present to [facility] or ED immediately.  Patient agreeable.

## 2010-06-19 NOTE — Telephone Encounter (Signed)
Fell down stairs last week and hurt her leg - is on blood thinner and is concerned of bruising and knot on thigh.

## 2010-06-20 ENCOUNTER — Encounter: Payer: Self-pay | Admitting: Family Medicine

## 2010-06-20 ENCOUNTER — Ambulatory Visit (INDEPENDENT_AMBULATORY_CARE_PROVIDER_SITE_OTHER): Payer: 59 | Admitting: Family Medicine

## 2010-06-20 VITALS — BP 112/70 | HR 80 | Temp 98.1°F | Wt 184.0 lb

## 2010-06-20 DIAGNOSIS — S7010XA Contusion of unspecified thigh, initial encounter: Secondary | ICD-10-CM | POA: Insufficient documentation

## 2010-06-20 NOTE — Assessment & Plan Note (Signed)
Hematoma of left lateral thigh.  Likely larger than expected because she is on Aspirin and Plavix.  There is no signs of serious injury like compartment syndrome or DVT.  This appears to be the normal progression of a bruise.  Advised her that it may take months for her body to reabsorb the blood from the hematoma.  Precautions given for pain, swelling, numbness, cyanosis, fevers, warmth, redness

## 2010-06-20 NOTE — Progress Notes (Signed)
  Subjective:    Patient ID: Leah [name], female    DOB: 1950-10-14, 60 y.o.   MRN: [id number]  HPI 1. Hematoma on left thigh:  This happened 10 days ago.  She was walking down the stairs when she tripped and started to fall.  She didn't want to catch herself on the hand rail because that is how her father broke his hand so she twisted and landed on the lateral aspect of her left thigh.  She had immediate pain and swelling.  A couple days later she started to develop a hematoma.  The hematoma has continued to spread and she has small knots in the bruise that is concerning for her.   Review of Systems She denies leg swelling, decreased sensation, leg pain without palpation, cyanosis    Objective:   Physical Exam  Constitutional: She appears well-nourished. No distress.  Cardiovascular: Normal rate and regular rhythm.   Pulmonary/Chest: Effort normal and breath sounds normal. No respiratory distress.  Musculoskeletal: She exhibits no edema.       Left thigh:  Large hematoma on the lateral aspect of the left thigh, measuring approximately 12x8inches.  There are small superficial palpable knots but no signs of deep clots.  Normal sensation.  Normal ROM.  No swelling.  Mildly TTP directly over the bruise but not elsewhere in her legs.  Left Lower extremity:  Normal sensation.  Intact peripheral pulses.          Assessment & Plan:

## 2010-08-07 NOTE — Assessment & Plan Note (Signed)
OFFICE VISIT   Leah [name], Leah [name]  DOB:  1950-12-16                                       12/20/2008  CHART#:[id number]   The patient returns now 1-year post left carotid endarterectomy done for  asymptomatic severe left internal carotid stenosis detected by Dr.  Eldridge Dace after hearing carotid bruits.  She also has had bilateral renal  stents and stenting of the LAD in June 2009 with a drug-eluding stent.  She did well following her left carotid surgery and has been followed  for some mild to moderate contralateral right internal carotid stenosis.  She denies any neurologic symptoms such as hemiparesis, aphasia,  amaurosis fugax, diplopia, blurred vision or syncope.  She also has been  having no chest pain or claudication symptoms.  She takes 1 aspirin 325  mg and 1 Plavix 75 mg per day.   PHYSICAL EXAMINATION:  Blood pressure 160/74, heart rate 65,  respirations 14.  Carotid pulses are 3+ with soft bruits bilaterally.  Neurologic:  Normal.  No palpable adenopathy in the neck.  Chest:  Clear  to auscultation.  Cardiovascular:  Regular rhythm no murmurs.  She has  2+ dorsalis pedis pulses bilaterally.   Carotid duplex today is unchanged from 6 months ago with some mild to  moderate flow reduction both internal carotid arteries, stable, at 40%  to 50%.   She is reassured regarding these findings.  Will continue to follow her  on an annual basis in the vascular lab for progression of disease on the  right side and recurrent disease on the left unless she develops any  symptoms in the interim.   Leah [name], M.D.  Electronically Signed   JDL/MEDQ  D:  12/20/2008  T:  12/21/2008  Job:  [id number]

## 2010-08-07 NOTE — Cardiovascular Report (Signed)
NAMEJAYCIE, KREGEL            ACCOUNT NO.:  [id number]   MEDICAL RECORD NO.:  [id number]          PATIENT TYPE:  INP   LOCATION:  6531                         FACILITY:  [facility]   PHYSICIAN:  Corky Crafts, MDDATE OF BIRTH:  1950/06/24   DATE OF PROCEDURE:  DATE OF DISCHARGE:                            CARDIAC CATHETERIZATION   PROCEDURES PERFORMED:  Percutaneous coronary intervention of the left  anterior descending.   REFERRING:  Dr. Micah Noel  Dr. Armanda Magic   INDICATIONS FOR PROCEDURE:  Stable angina.   PROCEDURE:  The diagnostic catheterization had been performed by Dr.  Armanda Magic showing a 95% mid LAD lesion at the bifurcation of the  first diagonal.  Her 4-French sheath was exchanged over a wire for a 6-  [place] sheath.  There was difficulty in navigating the wire through the  iliac system.  We used a wooly wire.  A CLS 3.5 Guidant catheter was  used to engage the ostium of the left main.  A prowater wire was placed  into the LAD.  A BMW wire was placed into the diagonal.  A 2.0 x 6  cutting balloon was advanced to the LAD lesion but could not cross.  A  2.0 x 8 mm apex balloon was then advanced to the lesion and deployed at  10 atmospheres for 30 seconds.  A 2.5 x 12-mm Promus stent was then  placed across the lesion and deployed at 9 atmospheres for 26 seconds.  The BMW wire was removed from the diagonal and that vessel was rewired  through the stent's strut.  A 2.5 x 8-mm Quantum Maverick was then  placed within the stented area and inflated to 10 atmospheres for 22  seconds and then at 16 atmospheres for 22 seconds to postdilate the  stent.  A 2.0 x 8 Apex balloon was then advanced into the ostium of the  diagonal and  inflated to 6 atmospheres for 17 seconds.  Multiple doses  of intracoronary nitroglycerin were given to the patient.  There is an  excellent angiographic result.  There was TIMI 3 flow noted in both the  diagonal as well as the LAD.  The  patient tolerated the procedure very  well.  There was no residual stenosis in the LAD.  There is about 20%  residual stenosis in the ostium of diagonal.   IMPRESSION:  Successful drug-eluting stent to the mid left anterior  descending with angioplasty of the bifurcation diagonal.   RECOMMENDATIONS:  The patient will be watched overnight.  She will get  18 hours of Integrilin.  She will continue aspirin and Plavix for at  least a year along with other secondary prevention.  She will follow up  with Dr. Mayford Knife and Dr. Micah Noel.      Corky Crafts, MD  Electronically Signed     JSV/MEDQ  D:  09/10/2007  T:  09/10/2007  Job:  [id number]   cc:   Angelique Holm, MD

## 2010-08-07 NOTE — Procedures (Signed)
CAROTID DUPLEX EXAM   INDICATION:  Follow up known carotid artery disease and left carotid  endarterectomy.   HISTORY:  Diabetes:  No.  Cardiac:  Coronary artery disease and stent.  Hypertension:  Yes.  Smoking:  Yes.  Previous Surgery:  Left carotid endarterectomy on 11/20/07.  CV History:  Amaurosis Fugax No, Paresthesias No, Hemiparesis No.                                       RIGHT             LEFT  Brachial systolic pressure:         160               170  Brachial Doppler waveforms:         Biphasic          Biphasic  Vertebral direction of flow:        Antegrade         Antegrade  DUPLEX VELOCITIES (cm/sec)  CCA peak systolic                   138               122  ECA peak systolic                   164               133  ICA peak systolic                   177               178  ICA end diastolic                   55                46  PLAQUE MORPHOLOGY:                  Heterogenous      Heterogenous  PLAQUE AMOUNT:                      Moderate          Moderate  PLAQUE LOCATION:                    ICA, ECA          ICA, ECA   IMPRESSION:  1. 40-59% stenosis noted in bilateral internal carotid arteries.  2. Status post left carotid endarterectomy.  3. Antegrade bilateral vertebral arteries.   ___________________________________________  [name], M.D.   MG/MEDQ  D:  12/20/2008  T:  12/21/2008  Job:  [id number]

## 2010-08-07 NOTE — Op Note (Signed)
NAMECAMARYN, Leah [name]            ACCOUNT NO.:  [id number]   MEDICAL RECORD NO.:  [id number]           PATIENT TYPE:   LOCATION:                                 FACILITY:   PHYSICIAN:  Corky Crafts, MDDATE OF BIRTH:  April 26, 1950   DATE OF PROCEDURE:  10/01/2007  DATE OF DISCHARGE:                               OPERATIVE REPORT   REFERRING PHYSICIAN:  Armanda Magic, MD   PROCEDURES PERFORMED:  1. Abdominal aortogram.  2. Pelvic angiogram.  3. Selective bilateral renal artery angiogram.  4. Right iliac angiogram with percutaneous transluminal coronary      angioplasty/stent to the right external iliac artery.  5. Bilateral renal artery stent placement.   OPERATOR:  Corky Crafts, MD   INDICATIONS:  Hypertension refractory to medical therapy and  claudication.   PROCEDURE NARRATIVE:  The risks and benefits of peripheral vascular  angiography were explained to the patient, and informed consent was  obtained.  The right groin was infiltrated with 1% lidocaine.  A 6-  French short sheath was placed into the right femoral artery using the  modified Seldinger technique.  There was difficulty in navigating a wire  through the right iliac system.  A Wholey wire was eventually used.  A  pigtail catheter was advanced to the abdominal aorta, and a power  injection of contrast was performed.  The catheter was then pulled down  to the aortoiliac bifurcation, and another power injection of contrast  was performed.  This revealed a tight right iliac stenosis, with sheath  in place was nearly occlusive.  The sheath was changed out, and the  PTA/stent of the right external iliac was performed.  Subsequently, the  renal intervention was performed as outlined below.  The patient  tolerated the procedure well.  The sheath was removed using manual  compression.   FINDINGS:  There is mild abdominal atherosclerosis.  There is a 90%  right external iliac stenosis with a high bifurcation  of the internal  iliac.  There is moderate diffuse atherosclerosis of the left iliac  system without any apparent focal hemodynamically significant stenosis.  The abdominal aortogram and selective renal angiography revealed a 90%  left renal artery stenosis with a 40-mm gradient.  The right renal  artery had a 70% stenosis with a 40-mm gradient as well.   INTERVENTIONAL NARRATIVE:  The short sheath was then exchanged for a  Bright Tip sheath.  The [facility] wire was kept across the right iliac  lesion.  A 5 mm x 2 cm power balloon was then placed across the lesion  and inflated to 10 atmospheres for 40 seconds.  Subsequently, a 9 mm x 4  cm Protg self-expanding stent was placed in the distal common iliac  extending into the proximal external iliac.  This did cover the ostium  of the internal iliac.  The stenotic area after the stent was deployed,  was postdilated with a 7 x 2 FoxCross balloon inflated to 10 atmospheres  for 50 seconds, and then to 10 atmospheres for 25 seconds.  An Amplatz  wire was then placed across the  stent and into the abdominal aorta.  The  long sheath was then advanced over the Amplatz wire into the infrarenal  aorta.  A short IMA guide was then placed through the long sheath to the  ostium of the left renal artery.  A stabilizer wire was placed across  the lesion.  The ostial renal artery stenosis was predilated with a 5.0  x 15 mm Aviator balloon to 8 atmospheres for 40 seconds.  Subsequently,  a 6 x 12 Herculink stent was then placed in the ostium of the left renal  artery and deployed at 10 atmospheres for 30 seconds.  The ostium was  flared with a 10 atmospheres for 16-second inflation.  The right renal  artery was then engaged with the same guide.  The same wire was placed  across the lesion.  A 5.0 x 15 Aviator was used to predilate the lesion.  A 6.0 x 15 mm Herculink stent was placed across the stenosis in the  ostium of the right renal artery and deployed at  10 atmospheres for 50  seconds and then again 10 atmospheres for 30 seconds.   IMPRESSION:  1. Significant right external iliac disease.  This was successfully      stented with a self-expanding Protg stent, 9 x 40, with an      excellent angiographic result.  2. Bilateral significant renal artery stenosis.  Both renal arteries      were stented, the left with a 6.0 x 12 mm Herculink, and the right      with a 6.0 x 15 mm Herculink.  There were excellent angiographic      results.  The patient tolerated the procedure well.   RECOMMENDATIONS:  We will watch her overnight.  Continue aspirin and  Plavix along with aggressive secondary prevention.      Corky Crafts, MD  Electronically Signed     JSV/MEDQ  D:  10/07/2007  T:  10/08/2007  Job:  [id number]

## 2010-08-07 NOTE — Op Note (Signed)
NAMEANTONIA, Leah [name]            ACCOUNT NO.:  [id number]   MEDICAL RECORD NO.:  [id number]          PATIENT TYPE:  OBV   LOCATION:  3316                         FACILITY:  [facility]   PHYSICIAN:  Quita Skye. Hart Rochester, M.D.  DATE OF BIRTH:  02/08/1951   DATE OF PROCEDURE:  11/20/2007  DATE OF DISCHARGE:                               OPERATIVE REPORT   PREOPERATIVE DIAGNOSIS:  Severe left internal carotid stenosis -  asymptomatic.   POSTOPERATIVE DIAGNOSIS:  Severe left internal carotid stenosis -  asymptomatic.   OPERATION:  Left carotid endarterectomy with Dacron patch angioplasty.   SURGEON:  Quita Skye. Hart Rochester, MD   FIRST ASSISTANT:  Jerold Coombe, PA   ANESTHESIA:  General endotracheal.   BRIEF HISTORY:  This patient was found by Dr. Everette Rank to have  severe left carotid occlusive disease after hearing carotid bruits.  She  has a history of bilateral renal stents, as well as PTA and stenting of  her LAD in June 2009 with a drug-eluting stent is currently scheduled  for an elective left carotid endarterectomy for this asymptomatic  lesion.   PROCEDURE:  The patient was taken to the operating room, placed in the  supine position at which time satisfactory general endotracheal  anesthesia was administered.  The left neck was prepped with Betadine  scrub and solution draped in a routine sterile manner.  Incision was  made along the anterior border of the sternocleidomastoid muscle and  carried down through subcutaneous tissue and platysma using the Bovie.  Common facial vein and external jugular veins were ligated with 3-0 silk  ties and divided exposing the common internal and external carotid  arteries.  Care was taken not to injure the vagus or hypoglossal nerves  both of which were exposed.  There was a calcified atherosclerotic  plaque at the carotid bifurcation extending up the internal carotid  artery about 3 cm and the distal vessel appeared normal.  A #10 shunt  was  prepared and the patient was heparinized.  Carotid vessels were  occluded with vascular clamps.  Longitudinal opening made in the common  carotid with a 15 blade extended up the internal carotid with Potts  scissors to a point distal to the disease.  The #10 shunt was inserted  without difficulty reestablishing flow in about 2 minutes.  Standard  endarterectomy was then performed using the elevator and the Potts  scissors with an eversion endarterectomy of the external carotid.  Plaque feathered off the distal internal carotid artery nicely not  requiring any tacking sutures.  Lumen was thoroughly irrigated with  heparin saline.  All loose debris carefully removed and arteriotomy was  closed with a patch using continuous 6-0 Prolene.  Prior to completion  of the closure, the shunt was removed after about 30 minutes shunt time.  Following antegrade and retrograde flushing, closure was completed  reestablishing the flow initially up the external and up the internal  branch.  Carotid was occluded for less than 2 minutes for removal of the  shunt.  Protamine was then given to reverse the heparin following  adequate hemostasis.  The wound was irrigated with saline, closed in  layers with Vicryl in subcuticular fashion.  Sterile dressing applied.  The patient taken to recovery room in satisfactory condition.      [name], M.D.  Electronically Signed     JDL/MEDQ  D:  11/20/2007  T:  11/21/2007  Job:  [id number]   cc:   Corky Crafts, MD

## 2010-08-07 NOTE — Discharge Summary (Signed)
Leah [name], Leah [name]            ACCOUNT NO.:  [id number]   MEDICAL RECORD NO.:  [id number]          PATIENT TYPE:  OBV   LOCATION:  3316                         FACILITY:  [facility]   PHYSICIAN:  Quita Skye. Hart Rochester, M.D.  DATE OF BIRTH:  1950-06-11   DATE OF ADMISSION:  11/20/2007  DATE OF DISCHARGE:  11/21/2007                               DISCHARGE SUMMARY   FINAL DISCHARGE DIAGNOSES:  1. Severe left internal carotid stenosis, asymptomatic.  2. Hypertension.  3. Hyperlipidemia.  4. Coronary artery disease.  5. Chronic obstructive pulmonary disease.   PROCEDURES PERFORMED:  Left carotid endarterectomy by Dr. Hart Rochester, November 21, 2007.   COMPLICATIONS:  None.   DISCHARGE MEDICATIONS:  1. Metoprolol 25 mg p.o. b.i.d.  2. Plavix 75 mg  p.o. daily.  3. Benicar.  4. Hydrochlorothiazide 20/12.5 p.o. daily.  5. Aspirin 325 mg p.o. daily.  6. WelChol 3 tablets p.o. daily.  7. Crestor 5 mg p.o. daily.  8. Nitroglycerin sublingual p.r.n.  9. Prilosec OTC daily.  10.Correctol OTC p.r.n.  11.She was then given a prescription for Tylox 1-2 tablets p.o. q.4 h.      p.r.n. pain, total #30 were given.   DISPOSITION:  She is being discharged home in stable and improving  condition.  She is being discharged home with her wound healing well.  She is instructed to observe the wound for signs of infection and  neurological changes.  She was given careful instructions regarding the  care of her wounds.  She is cautioned about driving for the next 2 weeks  or lift any heavy objects for the next 2 weeks.  She is to see Dr.  Hart Rochester in 2 weeks.  The office will arrange the visit.   BRIEF IDENTIFYING STATEMENT:  For complete details, please refer the  typed history and physical.   Briefly, this is a pleasant 60 year old woman who was referred to Dr.  Hart Rochester with bilateral carotid bruits.  A carotid Duplex demonstrated  severe left internal carotid stenosis with a moderate right internal  carotid  stenosis.  She denies any specific neurological symptoms.  Dr.  Hart Rochester recommended left carotid endarterectomy for stroke prevention.  She was informed of the risk and benefits of the procedure and after  careful consideration, we elected to proceed with surgery.   HOSPITAL COURSE:  Preoperative workup was completed as an outpatient.  She was brought in through same-day surgery and underwent the  aforementioned left carotid endarterectomy.  For complete details,  please refer to the typed operative report.  The procedure was without  complications.  She was returned to the Postanesthesia Care Unit  extubated.  Following stabilization, she was transferred to a bed on the  surgical step-down unit.  She was observed overnight.  Following  morning, she was desirous of discharge.  Her exam was nonfocal.  Her  tongue was in the midline.  Her strengths were equal.  She had no  difficulty swallowing.  She was felt stable and was discharged home in  stable condition on November 21, 2007.      Wilmon Arms, PA  [name], M.D.  Electronically Signed    KEL/MEDQ  D:  11/21/2007  T:  11/21/2007  Job:  [id number]

## 2010-08-07 NOTE — Assessment & Plan Note (Signed)
OFFICE VISIT   SANTASIA, REW  DOB:  25-Jan-1951                                       12/08/2007  CHART#:[id number]   This patient underwent an elective left carotid endarterectomy by me on  August 28 for severe left internal carotid stenosis, which was  asymptomatic.  This was found by Dr. Everette Rank.  She has done well  with no neurologic complications.  She denies any hemispheric or non-  hemispheric TIAs, amaurosis fugax, diplopia, blurred vision, or syncope.  She is swallowing well and has no hoarseness.  She has resumed her  aspirin.   PHYSICAL EXAMINATION:  Blood pressure is 133/79, heart rate 77,  respirations 14.  Her left neck incision is healing nicely.  There is no  evidence of infection.  She has excellent carotid pulses with a soft  bruit on the right.  No bruit on the left.  Neurologic exam is normal  with the exception of a very mild left marginal mandibular nerve  paresis, which should resolve over the next few months.   I have reassured her regarding the findings, and will see her back in 6  months for followup carotid duplex exam unless she develops any symptoms  in the interim.  She will continue her daily aspirin.   [name], M.D.  Electronically Signed   JDL/MEDQ  D:  12/08/2007  T:  12/10/2007  Job:  [id number]   cc:   Armanda Magic, M.D.  Dr. Micah Noel

## 2010-08-07 NOTE — Discharge Summary (Signed)
NAMEJESI, JURGENS            ACCOUNT NO.:  [id number]   MEDICAL RECORD NO.:  [id number]          PATIENT TYPE:  INP   LOCATION:  6531                         FACILITY:  [facility]   PHYSICIAN:  Corky Crafts, MDDATE OF BIRTH:  [birth year]   DATE OF ADMISSION:  09/10/2007  DATE OF DISCHARGE:  09/11/2007                               DISCHARGE SUMMARY   DISCHARGE DIAGNOSES:  1. Coronary artery disease, status post drug-eluting stent to left      anterior descending, angioplasty to the fist diagonal.  2. Hypertension.  3. Left renal artery stenosis.  4. Dyslipidemia.  5. Tobacco abuse, smoking cessation counseling.   Ms. Chaplin is a 60 year old female who continued to have episodic  chest pain.  As an outpatient, she underwent a Cardiolite that did not  show evidence of inducible ischemia but did show EF to be around 42%.  She did have a hypertensive response to exercise.  Due to the  hypertensive blood pressure response, the test had to be switched from  exercise study to an adenosine Cardiolite.  Even though her scan showed  no evidence of inducible myocardial ischemia, she continued to have  chest pain, therefore admitted for cath.   Catheterization was performed on September 10, 2007, and she was found to  have a 90% mid-to-distal LAD lesion as well as a first diagonal lesion.  Dr. Eldridge Dace then placed a drug-eluting stent to the LAD without  difficulty.  He also performed angioplasty of the first diagonal.   During the catheterization, we did visualize the renal arteries, and the  patient had left renal artery stenosis approximately at 80%.  Dr.  Eldridge Dace felt that the patient could be treated at a separate time for  the renal artery stenosis and plan is to see her back in 2 weeks in  order schedule a time for her renal intervention.   LAB STUDIES:  Hemoglobin 12.0, hematocrit 35.8, white count 9.8, and  platelets 145.  Sodium 138, potassium 3.6, BUN 12, and creatinine  0.94.   DISCHARGE MEDICATIONS:  1. Enteric-coated aspirin 325 mg a day.  2. Plavix 75 mg a day.  3. WelChol 625 mg 3 tablets twice a day.  4. Benicar/HCTZ 20/12.5 mg 1 tablet daily.  5. Metoprolol 25 mg twice a day.  6. Sublingual nitroglycerin p.r.n.  7. Prilosec 20 mg day.  8. Nicoderm patch 21 mg per day, continue with her 6-week taper.   The patient is instructed to clean the cath site gently with soap and  water, no scrubbing.  Remain on a low-sodium, heart-healthy diet.  Increase activity slowly.  No lifting over 10 pounds for 1 week.  No  driving for 2 days.  Follow up with Dr. Eldridge Dace to prepare for  percutaneous intervention to the left renal artery on September 23, 2007, at  1:30 p.m.  At this time, he will set up the procedure.  The patient will  follow up with Dr. Armanda Magic in approximately 6 weeks.      Guy Franco, P.A.      Corky Crafts, MD  Electronically Signed  LB/MEDQ  D:  09/11/2007  T:  09/12/2007  Job:  [id number]   cc:   Armanda Magic, M.D.

## 2010-08-07 NOTE — H&P (Signed)
Leah [name], Leah [name]            ACCOUNT NO.:  [id number]   MEDICAL RECORD NO.:  [id number]          PATIENT TYPE:  EMS   LOCATION:  ED                           [facility]ACILITY:  [facility]   PHYSICIAN:  Marcellus Scott, MD     DATE OF BIRTH:  21-Oct-1950   DATE OF ADMISSION:  08/29/2007  DATE OF DISCHARGE:                              HISTORY & PHYSICAL   PRIMARY MEDICAL DOCTOR:  Dr. Micah Noel of [facility] in [place],  [place].   CHIEF COMPLAINTS:  Left-sided chest pain.   HISTORY OF PRESENT ILLNESS:  Leah [name] is a pleasant 60 year old  African American female patient with history of hypertension,  noncompliant with medications for approximately a month, hyperlipidemia  and anemia, who indicates that she has been having precordial chest  pains for approximately 3 weeks.  Initially the chest pains where mild,  intermittent, which was pressing in nature, but lasted only a minute  with sometimes associated left upper extremity tingling, but would  subside spontaneously and not return for a couple of days.  However,  over the last 1 week, she has had the chest pain which has progressively  gotten more frequent and more severe.  The chest pain is pressing in  nature, 9/10 at its worst with tingling in the left upper extremity and  burning on the left side of the back.  This is associated with dyspnea,  episodes of nausea, vomiting and sometimes diaphoresis.  She denies any  cough or pleuritic nature to the pain.  There is no history of trauma or  lifting weights.  There is no history of long-distance travel.  There is  no relationship to food.  Initially, she thought the pain occurred on  exertion, but now occurs even at rest.  Today, she actually came to the  emergency room to seek attention for her friend who had a rash of poison  ivy.  While sitting in the emergency room, she experienced one of those  episodes of severe pains and decided to come to the ED for evaluation.  On  initial evaluation, her blood pressure was noted to be 181/109.  She  has received two 5 mg IV doses of Lopressor, 1 inch of nitroglycerin  ointment, full-dose aspirin and full-dose Lovenox.  She continues to  have 4/10 pressing type of precordial chest pain, but no symptoms in the  left upper extremity or back at this time.   PAST MEDICAL HISTORY:  1. Hypertension for 2 years.  2. Hyperlipidemia.  3. Anemia.  4. History of colonoscopy 2 years ago which was said to be negative.   PAST SURGICAL HISTORY:  Hysterectomy in 1988.   ALLERGIES:  NO KNOWN DRUG ALLERGIES.   MEDICATIONS:  1. Benicar HCT 12.5/40 one p.o. daily.  2. Welchol, which she says she takes 1 tablet p.o. t.i.d.  3. Aspirin 81 mg p.o. daily.   FAMILY HISTORY:  1. The patient's mother died at age 80 years from lung cancer with      mets to the brain, this was about 3 years ago.  2. Father is alive with history of  emphysema.  3. The patient's brother age 99 years died 2 weeks ago, said to be      from [employer] disease.   SOCIAL HISTORY:  The patient is single.  She is a Engineer, mining for [employer].  She has a 40 pack year smoking history.  She drinks a 12-pack  beer over the weekends, but denies any liquor use or drinking on other  days of the week.  There is no history of drug abuse.   REVIEW OF SYSTEMS:  Fourteen systems reviewed and apart from history of  presenting illness is noncontributory.   PHYSICAL EXAMINATION:  GENERAL:  Leah [name] is a moderately built  and nourished female patient.  The patient is in no obvious distress.  VITAL SIGNS:  Temperature 99.5 degrees Fahrenheit, blood pressure which  was 181/109 on arrival is now 155/77 mmHg, pulse 54 per minute, regular,  respirations 20 per minute.  Saturating at 99% on room air.  HEENT:  Pupils are equally reactive to light and accommodation.  Nontraumatic, normocephalic.  Oral cavity with missing multiple teeth,  but no oropharyngeal erythema or  thrush.  NECK:  Supple.  No JVD or carotid bruit.  LYMPHATICS:  No lymphadenopathy.  BREASTS: exam deferred.  RESPIRATORY:  Clear to auscultation.  No chest wall tenderness.  CARDIOVASCULAR:  First and second heart sounds heard.  No third or  fourth heart sounds or murmurs.  ABDOMEN:  Nondistended, nontender.  No organomegaly or mass appreciated.  Bowel sounds are normally heard. No bruit.  CENTRAL NERVOUS SYSTEM:  The patient is awake, alert and oriented x3.  No focal neurological deficits.  EXTREMITIES:  With no cyanosis, clubbing or edema.  Peripheral pulses  are symmetrically felt.  SKIN:  Without rashes.  MUSCULOSKELETAL:  Noncontributory.   LABORATORY DATA:  Point of care cardiac markers x2 negative.  Electrolytes; sodium 140, potassium 3.4, chloride 101, bicarb 27,  glucose 99, BUN 6, creatinine 1.1.  CBCs with hemoglobin 14.4,  hematocrit 43.7, white blood cell 13, platelets 161.   Chest x-ray has been reviewed by me and preliminarily reported as no  acute findings.   EKG with normal sinus rhythm at 93 beats per minute with features of  left ventricular hypertrophy and strain pattern and biatrial  enlargement.   ASSESSMENT AND PLAN:  1. Precordial chest pain.  Differential diagnosis is to rule out acute      coronary syndrome, including unstable angina versus PE versus chest      pain secondary to hypertensive urgency versus rule out aortic      dissection.  Will obtain stat CT angiogram of the chest, both to      rule out aortic dissection as well as pulmonary embolism.  Will      admit to step-down.  Will cycle cardiac enzymes and obtain a 2-D      echocardiogram.  Will continue aspirin, and change to nitroglycerin      drip and place on low-dose beta-blockers. Cardiology has been      consulted.  2. Hypertensive emergency with improved blood pressure control often      IV Lopressor.  Will continue Diovan/hydrochlorothiazide and add low-      dose beta-blockers.   Will obtain urine drug screen.  3. Hyperlipidemia.  To check fasting lipids and continue Welchol.  4. Tobacco abuse.  For cessation counseling and nicotine patch.   CC: Dr. Micah Noel of [facility] in [place], [place].  Marcellus Scott, MD  Electronically Signed     AH/MEDQ  D:  08/29/2007  T:  08/29/2007  Job:  [id number]   cc:   Micah Noel, MD  [facility]  [place], [place]

## 2010-08-07 NOTE — Discharge Summary (Signed)
Leah [name], Leah [name]            ACCOUNT NO.:  [id number]   MEDICAL RECORD NO.:  [id number]          PATIENT TYPE:  INP   LOCATION:  1402                         FACILITY:  [facility]   PHYSICIAN:  Leah Scott, MD     DATE OF BIRTH:  02-May-1950   DATE OF ADMISSION:  08/29/2007  DATE OF DISCHARGE:  08/31/2007                               DISCHARGE SUMMARY   PRIMARY MEDICAL DOCTOR:  Dr. Micah Noel of [facility] in [place],  [place].   DISCHARGE DIAGNOSES:  1. Precordial chest pain.  2. Hypertensive emergency.  3. Hyperlipidemia.  4. Tobacco abuse.  5. Leukocytosis.  6. Mild thrombocytopenia.   DISCHARGE MEDICATIONS:  1. Enteric-coated aspirin 81 mg p.o. daily.  2. Welchol 625 mg tablet, three tablets p.o. daily.  3. Metoprolol 25 mg p.o. b.i.d.  4. Nicotine 21 mg/24-hour patch, one daily for six weeks then taper.  5. Benicar/hydrochlorothiazide 12.5/40 one p.o. daily.  6. Nitroglycerin sublingual 0.4 mg p.r.n.  7. Prilosec over-the-counter 20 mg p.o. daily.   PROCEDURES:  1. CT angiogram of the chest.  Impression:  No acute abnormality.  2. Chest x-ray.  Impression:  No acute findings.   PERTINENT LABORATORY DATA:  CBC with hemoglobin 13.3, hematocrit 40.3,  white blood cells 10, platelets 150.  TSH is 1.562.  Urine microscopy  not suggestive of UTI.  Lipid panel with cholesterol 219, triglycerides  106, HDL 37, LDL 161, VLDL 21.  Cardiac panel cycled and not suggestive  of acute coronary syndrome.  Urine drug screen negative.  Basic  metabolic panel unremarkable with BUN 10 and creatinine 0.91.  Blood  alcohol level negative.   CONSULTATION:  Cardiology, Dr. Armanda Magic.   HOSPITAL COURSE AND DISPOSITION:  Please refer to the history and  physical note for initial admission details.  In summary, Leah [name]  is a pleasant 60 year old [place] American female patient with history  of hypertension, hyperlipidemia who has been noncompliant with her  antihypertensive medications for approximately a month.  She has also  been stressed by the loss of her younger brother recently.  In any  event, she presented with intermittent precordial chest pain of three  weeks' duration which progressively worsened for the week prior to  admission.  She actually brought to friend to the hospital for  evaluation for poison ivy when she had another episode of chest pain and  decided to present to the emergency room.  In the emergency room she had  markedly elevated blood pressures of 181/109.  She was thereby admitted  for evaluation of her hypertensive urgency and chest pain.   1. Precordial chest pain.  The patient was admitted to the step-down.      The differential diagnosis for chest pain were unstable angina      versus secondary to gastroesophageal reflux disease versus      secondary to hypertensive emergency.  She was admitted to telemetry      with no significant arrhythmia alarms.  Her cardiac enzymes were      cycled and not suggestive of acute MI.  To rule out dissection and  pulmonary embolism, she had a CT angiogram of the chest which was      negative.  The patient was placed on nitroglycerin paste, aspirin      and beta blockers.  With these measures, the patient's chest pain      has since resolved.  She has ruled out for an MI.  She needs to get      a stress test but unfortunately she might not be able to get it in-      house today or maybe even tomorrow due to scheduling problems.  Dr.      Mayford Knife has recommended discharging the patient and following up in      her office before 12:30 p.m. today for an outpatient stress test.      The patient has made arrangements for transport to [facility], [address], [address] [place], [place], [place], [address].  Phone number [phone].  Further      recommendations by the cardiologist.  2. Hypertensive emergency.  This improved with IV beta  blockers and      resuming her home medications and adding p.o. beta blockers.  The      patient is advised compliance with her medications.  3. Hyperlipidemia.  Please consider adding a statin as an outpatient      by her primary medical doctor and following her lipid panel      closely.  4. Tobacco abuse.  The patient indicates she wants to quit smoking.      She has been counseled and provided with a nicotine patch taper.  5. Leukocytosis.  This has resolved.  There is no clinical focus of      sepsis.  This may be stress related.  6. Mild thrombocytopenia.  To follow this as an outpatient.      Leah Scott, MD  Electronically Signed     AH/MEDQ  D:  08/31/2007  T:  08/31/2007  Job:  [id number]   cc:   Angelique Holm, M.D.  [facility]  [place], [place]   Armanda Magic, M.D.  Fax: [phone]

## 2010-08-07 NOTE — Procedures (Signed)
CAROTID DUPLEX EXAM   INDICATION:  Carotid disease.   HISTORY:  Diabetes:  No  Cardiac:  CAD  Hypertension:  Yes  Smoking:  Yes  Previous Surgery:  Left carotid endarterectomy on November 20, 2007  CV History:  Currently asymptomatic  Amaurosis Fugax No, Paresthesias No, Hemiparesis No                                       RIGHT             LEFT  Brachial systolic pressure:         146               142  Brachial Doppler waveforms:         Normal            Normal  Vertebral direction of flow:        Not visualized    Antegrade  DUPLEX VELOCITIES (cm/sec)  CCA peak systolic                   155               130  ECA peak systolic                   148               151  ICA peak systolic                   128               109  ICA end diastolic                   42                39  PLAQUE MORPHOLOGY:                  Heterogeneous     Heterogeneous  PLAQUE AMOUNT:                      Mild              Mild  PLAQUE LOCATION:                    Proximal ICA      CCA   IMPRESSION:  1. Doppler velocities suggest 40% to 59% stenosis of the right      proximal internal carotid artery.  2. Patent left carotid endarterectomy site with no left internal      carotid artery stenosis.  3. Doppler velocities of the left internal carotid artery appear less      than previously recorded when compared to the previous exam on      December 02, 2008, with the right ICA remaining stable.   ___________________________________________  Quita Skye. Hart Rochester, M.D.   CH/MEDQ  D:  12/07/2009  T:  12/07/2009  Job:  [id number]

## 2010-08-07 NOTE — H&P (Signed)
HISTORY AND PHYSICAL EXAMINATION   November 09, 2007   Re:  Leah, CHEA                  DOB:  October 21, 1950   CHIEF COMPLAINT:  Severe left internal carotid stenosis - asymptomatic.   HISTORY OF PRESENT ILLNESS:  This 60 year old patient was recently seen  by Dr. Everette Rank for followup regarding her coronary artery disease  and peripheral vascular disease.  Previously undergone PTCA and stenting  to the LAD and first diagonal branch in June 2009 as well as bilateral  renal artery stenting and right external iliac artery stenting by Dr.  Eldridge Dace in the last few months.  She is found have carotid bruits and  carotid duplex exam was performed which revealed a severe left internal  carotid stenosis and moderate right internal carotid stenosis.  She  denies any specific neurologic symptoms such as hemiparesis, aphasia,  amaurosis fugax, diplopia, blurred vision or syncope.  She was referred  further evaluation.   PAST MEDICAL HISTORY:  1. Hypertension.  2. Hyperlipidemia.  3. Coronary artery disease.  No history myocardial infarction.  4. Negative for COPD, stroke or congestive heart failure.   PAST SURGERIES:  None.   INTERVENTIONS:  Included PTCA and stenting LAD and first diagonal June  2009, bilateral renal artery stenting right external iliac artery  stenting by Dr. Eldridge Dace.   FAMILY HISTORY:  Negative coronary artery disease, diabetes and stroke.   SOCIAL HISTORY:  Is single, has 4 children, works as a Engineer, mining,  she smokes half pack cigarettes per day and smoked between and one pack  a day for 35 years.  Does not use alcohol.   REVIEW OF SYSTEMS:  Does have bilateral claudication symptoms with  ambulation.  Denies any chest pain, dyspnea on exertion, PND, orthopnea.  Occasional headaches, otherwise negative.   MEDICATIONS:  1. Aspirin 325 mg daily.  2. Crestor 5 mg daily.  3. Imdur 30 mg daily.  4. Plavix 75 mg daily.  5. Prilosec 20  mg daily.  6. Metoprolol 25 mg b.i.d.  7. Benicar/hydrochlorothiazide 20/12.5 daily.  8. Welchol 625 mg 3 tablets b.i.d.   PHYSICAL EXAM:  Blood pressure 122/80, heart rate 70, respirations 14.  General: She is a healthy-appearing middle-aged female in no apparent  distress, alert and oriented x3.  Neck:  Supple 3+ carotid pulses  palpable.  Is a harsh bruit on the left, soft bruit on the right.  Neurologic: Normal with no palpable adenopathy in the neck.  Chest:  Clear to auscultation.  Cardiovascular: Regular rhythm.  No murmurs.  Abdomen: Soft, nontender with no masses.  She has 2+ femoral pulses  bilaterally well-perfused lower extremities.   IMPRESSION:  1. Severe left internal carotid stenosis - asymptomatic.  2. History of coronary artery disease, asymptomatic.  Post      intervention in June 2009.  3. Peripheral vascular disease status post lower extremity stent in      the right external iliac artery.  4. Hypertension.  5. Hyperlipidemia.   PLAN:  Is to admit the patient on August 28 for elective left carotid  endarterectomy.  Risks and benefits have been thoroughly discussed with  the patient.  She would like to proceed.  She will continue her Plavix  because of a drug-eluting and increased risk of hematoma and oozing from  the surgical site were discussed with the patient.   Leah [name], M.D.  Electronically Signed   JDL/MEDQ  D:  11/09/2007  T:  11/10/2007  Job:  [id number]   cc:   Armanda Magic, M.D.  Dr. Micah Noel

## 2010-08-07 NOTE — Procedures (Signed)
CAROTID DUPLEX EXAM   INDICATION:  Follow-up evaluation of known carotid artery disease.   HISTORY:  Diabetes:  No.  Cardiac:  Coronary artery disease, stent.  Hypertension:  Yes.  Smoking:  Yes.  Previous Surgery:  Bilateral renal artery stent.  Right iliac stent.  Previous duplex on November 04, 2007 revealed a greater than 70% left ICA  stenosis.  Patient reports periodic episodes of left arm numbness at  least once a day, and episodes of blurred vision on the top of her left  eye.  CV History:  Amaurosis Fugax Yes, Paresthesias Yes, Hemiparesis No.                                       RIGHT             LEFT  Brachial systolic pressure:         156               150  Brachial Doppler waveforms:         Triphasic         Triphasic  Vertebral direction of flow:        Occluded          Antegrade  DUPLEX VELOCITIES (cm/sec)  CCA peak systolic                   124               128  ECA peak systolic                   136               123  ICA peak systolic                   155               258  ICA end diastolic                   63                108  PLAQUE MORPHOLOGY:                  Mixed             Soft  PLAQUE AMOUNT:                      Moderate          Moderate-to-severe  PLAQUE LOCATION:                    Proximal ICA      Proximal ICA   IMPRESSION:  1. 40-59% right internal carotid artery stenosis (high end of range).  2. 60-79% left internal carotid artery stenosis (high end of range).  3. Inaudible right vertebral artery suggests occlusion.      ___________________________________________  [name], M.D.   MC/MEDQ  D:  11/09/2007  T:  11/09/2007  Job:  [id number]

## 2010-08-07 NOTE — Assessment & Plan Note (Signed)
OFFICE VISIT   Leah [name], Leah [name]  DOB:  1951/02/05                                       06/14/2008  CHART#:[id number]   The patient returns today for initial followup regarding a left carotid  endarterectomy which I performed August 28 for severe but asymptomatic  left internal carotid stenosis which was discovered by Dr. Eldridge Dace  after hearing carotid bruits.  She has a history of bilateral renal  stents and a recent ultrasound study was done to evaluate this, the  results of which are not known.  She has also had PTA and stenting of  her LAD in June of 2009 with a drug-eluting stent and is maintained on  aspirin and Plavix.  Since her surgery she has experienced a few  episodes of darkening of vision in the left eye which she states only  lasts for a few seconds or a minute at most and is generally relieved by  blinking the eye.  She has had no symptoms in the right eye and has had  no lateralizing motor or sensory symptoms.  She is swallowing well, has  no hoarseness and has no other complaints.   PHYSICAL EXAMINATION:  On exam today blood pressure is 167/82, heart  rate is 58, respirations 14.  Left neck incision has healed well.  The  left marginal mandibular nerve paresis has completely resolved.  She has  a harsh bruit on the left side with 3+ pulse palpable and a softer bruit  on the right side.  Neurologic exam is normal.   Carotid duplex exam today does show mild elevations of the velocity on  the left side suggesting some restenosis since her surgery 6 months ago.  Right side has an approximate 60% stenosis in the internal carotid.   I think we do need to watch this closely to make sure she is not  developing intimal hyperplasia in her left carotid endarterectomy site.  If she has any further lateralizing neurologic symptoms she will be in  touch with me otherwise I will see her in 6 months for a followup  carotid duplex exam in our office  at that time.   Leah [name], M.D.  Electronically Signed   JDL/MEDQ  D:  06/14/2008  T:  06/15/2008  Job:  [id number]   cc:   Armanda Magic, M.D.

## 2010-08-07 NOTE — Procedures (Signed)
CAROTID DUPLEX EXAM   INDICATION:  Follow up carotid artery disease.   HISTORY:  Diabetes:  No.  Cardiac:  CAD, stent.  Hypertension:  Yes.  Smoking:  Yes.  Previous Surgery:  Left carotid endarterectomy with Dacron patch  angioplasty on 11/20/07 by Dr. Hart Rochester.  CV History:  No.  Amaurosis Fugax Yes, Paresthesias Yes, Hemiparesis No.                                       RIGHT             LEFT  Brachial systolic pressure:         160               154  Brachial Doppler waveforms:         WNL               WNL  Vertebral direction of flow:        Antegrade         Antegrade  DUPLEX VELOCITIES (cm/sec)  CCA peak systolic                   202               198  ECA peak systolic                   349               236  ICA peak systolic                   196               178  ICA end diastolic                   67                78  PLAQUE MORPHOLOGY:                  Heterogenous      Heterogenous  PLAQUE AMOUNT:                      Moderate          Moderate  PLAQUE LOCATION:                    CCA, BIF, ICA, ECA                  CCA, ECA   IMPRESSION:  1. 40-59% right internal carotid artery stenosis.  2. Patent left internal carotid artery with no evidence of restenosis.      ___________________________________________  [name], M.D.   AC/MEDQ  D:  06/14/2008  T:  06/14/2008  Job:  [id number]

## 2010-08-07 NOTE — Cardiovascular Report (Signed)
NAMEREANNE, Leah            ACCOUNT NO.:  [id number]   MEDICAL RECORD NO.:  [id number]          PATIENT TYPE:  OIB   LOCATION:  2899                         FACILITY:  [facility]   PHYSICIAN:  Armanda Magic, M.D.     DATE OF BIRTH:  [birth year]   DATE OF PROCEDURE:  DATE OF DISCHARGE:                            CARDIAC CATHETERIZATION   PROCEDURE:  1. Left heart catheterization.  2. Coronary angiography.  3. Left ventriculography.  4. Distal aortography.   OPERATOR:  Armanda Magic, M.D.   INDICATION:  Chest pain.   COMPLICATIONS:  None.   IV MEDICATIONS:  1. Versed 1 mg.  2. Fentanly 25 mcg.  3. Plavix 300 mg p.o.  4. Pepcid IV 20 mg.  5. IV heparin 5000 units.   This is a 60 year old female who has a history of hypertension and  dyslipidemia as well as tobacco abuse.  She recently presented to the  hospital with episodes of mild intermittent chest pain, described as a  pressure sensation, lasting only for a few minutes at a time with left  upper extremity tingling.  She ruled out in the hospital for an MI and  underwent a stress test, which showed no evidence of inducible ischemia.  She also on that admission had a hypertensive emergency which was  treated with IV Lopressor, Diovan, and hydrochlorothiazide.  She now  presents for cardiac catheterization because of ongoing chest pain.   The patient was brought to the cardiac catheterization laboratory in a  fasting nonsedated state.  Informed consent was obtained.  The patient  was connected to continuous heart rate and pulse oximetry monitoring and  blood pressure monitoring.  The right groin was prepped and draped in  the sterile fashion.  Xylocaine 1% was used for local anesthesia.  Using  a modified Seldinger technique, a 4-French sheath was placed in right  femoral artery.  Under fluoroscopic guidance, a 4-French JL-4 catheter  was attempted to be placed into the left coronary ostium, but there was  resistance that  in passing the guidewire in the proximal right femoral  artery.  A Woolley wire was used to gain access into the aorta, and the  catheter was then placed under fluoroscopic guidance into the ostium of  the left coronary artery.  The catheter could not be adequately engaged  in the left coronary artery and the catheter was exchanged out of a  guidewire for a 4-French JL-5 catheter, which successfully engaged the  coronary ostium.  Multiple cine films were taken at 30-degree RAO and 40-  degree LAO views.  Catheter was then exchanged out of a guidewire for a  4-French 3D-RCA catheter, which successfully engaged the coronary ostium  under fluoroscopic guidance and multiple cine films were taken at 30-  degree RAO and 40-degree LAO views.  This catheter was then exchanged  out of a guidewire for a 4-French angled pigtail catheter, which was  placed under fluoroscopic guidance, left ventricular cavity.  Left  ventriculography was performed in the 30-degree RAO view using total of  30 mL of contrast at 15 mL per second.  The catheter  was then pulled  back across the aortic valve with no significant gradient noted.  The  catheter was then pulled back into the abdominal aorta just above the  renal artery takeoff.  A total of 30 mL of contrast was injected at 15  mL per second for distal aortography, which was taken in the AP view.  At the end of procedure, the catheter was removed over a guidewire.  The  sheath was sent in place.  The patient was given 5000 units of IV  heparin as well as 20 mg of IV Pepcid and 300 mg of Plavix and was  transferred to a telemetry bed to undergo PCI later this morning of the  LAD.   RESULTS:  1. Left main coronary artery is widely patent and bifurcates in left      anterior descending artery and left circumflex artery.   The left anterior descending artery is widely patent and is proximal  portion giving rise to a first small diagonal, which bifurcates the two   daughter vessels.  The ongoing LAD then gives rise to a second diagonal,  which is widely patent.  Just distal to the takeoff the second diagonal,  there is a 90% stenosis of the LAD.  The ongoing LAD to the apex is  patent.   The left circumflex is widely patent throughout its course.  The AV  groove gives rise to a very small obtuse marginal one branch.  It then  gives rise to a second obtuse marginal branch which is widely patent and  the distal left circumflex is widely patent as well.   The right coronary artery is widely patent in its proximal portion.  There is a tubular 30% narrowing in the midportion.  The distal portion  of the RCA is widely patent and bifurcates in a posterior descending  artery and posterior lateral artery, both of which are widely patent.   Left ventriculography shows normal LV function and EF 55%.  Distal  aortography shows an 80% left renal artery stenosis and diffuse distal  aortic disease with atherosclerosis of the distal aorta and right  femoral artery.  Of note, there was a pressure drop when the transducer  was attached to the femoral sheath.  At the end of the procedure, there  was a significant drop in the central aortic pressure from what it was  in the ascending aorta consistent with a possible significant stenosis  of the femoral artery or iliac artery.   ASSESSMENT:  1. One-vessel obstructive coronary disease of the mid left anterior      descending.  2. Normal left ventricular function.  3. Left renal artery stenosis.  4. Diffuse atherosclerosis of the distal aorta, right iliac artery,      and right femoral artery.   PLAN:  1. Admit to telemetry bed.  2. We will plan PCI of the LAD by Dr. Eldridge Dace later this morning with      possible PTA of the left renal artery, either on the same setting      or at a different time.  The patient will start on aspirin and      Plavix.     Armanda Magic, M.D.  Electronically Signed    TT/MEDQ   D:  09/10/2007  T:  09/10/2007  Job:  [id number]   cc:   Lewis Moccasin. Micah Noel

## 2010-08-08 ENCOUNTER — Other Ambulatory Visit: Payer: Self-pay | Admitting: Family Medicine

## 2010-08-08 DIAGNOSIS — M545 Low back pain: Secondary | ICD-10-CM

## 2010-08-08 MED ORDER — HYDROCODONE-ACETAMINOPHEN 7.5-500 MG PO TABS: 1.0000 | ORAL_TABLET | ORAL | Status: DC | PRN

## 2010-08-29 ENCOUNTER — Other Ambulatory Visit: Payer: Self-pay | Admitting: Family Medicine

## 2010-10-08 ENCOUNTER — Telehealth: Payer: Self-pay | Admitting: Family Medicine

## 2010-10-08 ENCOUNTER — Telehealth: Payer: Self-pay | Admitting: *Deleted

## 2010-10-08 NOTE — Telephone Encounter (Signed)
Is calling for refill for Hydrocodone.  Please call when it is ready for pick up.

## 2010-10-08 NOTE — Telephone Encounter (Signed)
Called pt and advised to schedule OV with new PCP. Needs to discuss refills. Pt agreed and will schedule appt with Dr.Corey .Leah [name]

## 2010-10-10 ENCOUNTER — Ambulatory Visit (INDEPENDENT_AMBULATORY_CARE_PROVIDER_SITE_OTHER): Payer: 59 | Admitting: Family Medicine

## 2010-10-10 ENCOUNTER — Encounter: Payer: Self-pay | Admitting: Family Medicine

## 2010-10-10 VITALS — BP 130/70 | HR 80 | Wt 179.9 lb

## 2010-10-10 DIAGNOSIS — F17200 Nicotine dependence, unspecified, uncomplicated: Secondary | ICD-10-CM

## 2010-10-10 DIAGNOSIS — I739 Peripheral vascular disease, unspecified: Secondary | ICD-10-CM

## 2010-10-10 MED ORDER — BUPROPION HCL ER (SR) 150 MG PO TB12: 150.0000 mg | ORAL_TABLET | Freq: Two times a day (BID) | ORAL | Status: DC

## 2010-10-10 NOTE — Progress Notes (Signed)
1) BL leg pain with ambulating. Can walk about a mild before she notes BL leg pain from gleteus that radiates down into her BL legs. She denies any weakness with this pain. She then notes that her pain improves rapidly with rest. She denies any pain at rest. Additionally she does not have decreased sensation in her legs. No bowel or bladder dysfunction. No foot or leg ulcers.   2) Smoking: Continues to smoke despite significant PVD. She wishes to quit and has tried to quit in the past with chantix. However she was intolerant of this medication. Her friend and her have plans to quit tobacco next month. She is interested in pharmacologic approaches to aiding a quit attempt. No seizure history.   PMH reviewed.  ROS as above otherwise neg  Exam:  Vs noted.  Gen: Well NAD HEENT: EOMI,  MMM Lungs: CTABL Nl WOB Heart: RRR no 2/6 systolic murmur to neck Abd: NABS, NT, ND Exts: Non edematous BL  LE MSK: No midline back tenderness. Non tender in Paraspinous groups. No pain with back flexion or extension. Straight leg raise neg as is stork test BL.

## 2010-10-10 NOTE — Patient Instructions (Signed)
Thank you for coming in today. We will schedule your artery ultrasound of your legs.  For smoking take the bupropion once a day the week before you quit. After 3 days go to two times a day.  Continue the bupropion up to 12 weeks.  See me 2 weeks after your quit date.  You can use both the patch daily (14mg  for you) and the gum for cravings on bupropion.  Let me know how it goes.

## 2010-10-11 ENCOUNTER — Encounter: Payer: Self-pay | Admitting: Family Medicine

## 2010-10-11 NOTE — Assessment & Plan Note (Signed)
Really would like to quit. I am happy to help. Pt already has a good plan for quitting. She has a friend who will also quit and a time.  Plan: Firm up quit date, use bupropion , and patch/gum PRN. Will follow up 2 weeks post quit attempt.

## 2010-10-11 NOTE — Assessment & Plan Note (Signed)
I think the leg pain is all consistent with claudication. We know that Ms Handa has PVD already so a ABI will be less helpful. Therefore I am moving to an arterial ultrasound which will show narrowing more specifically.  Will follow up with patient when report in hand. May need referral to vascular surgery.

## 2010-10-16 ENCOUNTER — Ambulatory Visit (HOSPITAL_COMMUNITY): Payer: 59 | Attending: Family Medicine

## 2010-11-21 ENCOUNTER — Other Ambulatory Visit: Payer: Self-pay | Admitting: Family Medicine

## 2010-11-21 MED ORDER — LISINOPRIL-HYDROCHLOROTHIAZIDE 20-12.5 MG PO TABS: 1.0000 | ORAL_TABLET | Freq: Every day | ORAL | Status: DC

## 2010-11-29 ENCOUNTER — Other Ambulatory Visit: Payer: Self-pay

## 2010-11-29 ENCOUNTER — Ambulatory Visit: Payer: Self-pay

## 2010-12-03 ENCOUNTER — Other Ambulatory Visit: Payer: Self-pay | Admitting: Family Medicine

## 2010-12-03 MED ORDER — LISINOPRIL-HYDROCHLOROTHIAZIDE 20-12.5 MG PO TABS: 1.0000 | ORAL_TABLET | Freq: Every day | ORAL | Status: DC

## 2010-12-07 ENCOUNTER — Other Ambulatory Visit: Payer: Self-pay | Admitting: Family Medicine

## 2010-12-07 MED ORDER — CLOPIDOGREL BISULFATE 75 MG PO TABS: 75.0000 mg | ORAL_TABLET | Freq: Every day | ORAL | Status: DC

## 2010-12-10 ENCOUNTER — Encounter: Payer: Self-pay | Admitting: Thoracic Diseases

## 2010-12-13 ENCOUNTER — Encounter: Payer: Self-pay | Admitting: Physician Assistant

## 2010-12-14 ENCOUNTER — Ambulatory Visit (INDEPENDENT_AMBULATORY_CARE_PROVIDER_SITE_OTHER): Payer: 59 | Admitting: Physician Assistant

## 2010-12-14 ENCOUNTER — Encounter (INDEPENDENT_AMBULATORY_CARE_PROVIDER_SITE_OTHER): Payer: 59 | Admitting: *Deleted

## 2010-12-14 VITALS — BP 151/81 | HR 73 | Resp 16 | Ht 73.5 in | Wt 170.0 lb

## 2010-12-14 DIAGNOSIS — Z48812 Encounter for surgical aftercare following surgery on the circulatory system: Secondary | ICD-10-CM

## 2010-12-14 DIAGNOSIS — I6529 Occlusion and stenosis of unspecified carotid artery: Secondary | ICD-10-CM

## 2010-12-14 DIAGNOSIS — I739 Peripheral vascular disease, unspecified: Secondary | ICD-10-CM

## 2010-12-14 DIAGNOSIS — I70219 Atherosclerosis of native arteries of extremities with intermittent claudication, unspecified extremity: Secondary | ICD-10-CM

## 2010-12-14 NOTE — Progress Notes (Signed)
Subjective:     Patient ID: Leah [name], female   DOB: March 14, 1951, 60 y.o.   MRN: [id number]  HPI 60 y/o BF who underwent a left CEA 11/20/07 by Dr. Hart Rochester and continues to have serial f/u carotid duplex scans.  She also has a hx of having bilateral renal artery and right external iliac artery drug eluding stents as well as one to the LAD in '09.  She denies any CVA sx and has been doing well.  She does have concerns of LLE claudication.  She states that if she walks a couple of blocks, she gets cramping in the anterior portion of her leg below the knee.  She denies any pain her feet while lying flat.  Past Medical History  Diagnosis Date  . Hyperlipidemia   . Hypertension   . GERD (gastroesophageal reflux disease)   . Leg pain   . Headache   . Carotid artery occlusion   . COPD (chronic obstructive pulmonary disease)   . CAD (coronary artery disease)   . Peripheral vascular disease     Past Surgical History  Procedure Date  . Carotid endarterectomy 11/21/2007  . Angioplasty / stenting femoral 2010    Left leg by Dr. Eldridge Dace  . Coronary angioplasty with stent placement     Dr. Eldridge Dace    No Known Allergies  Current outpatient prescriptions:aspirin 325 MG tablet, Take 325 mg by mouth daily.  , Disp: , Rfl: ;  buPROPion (WELLBUTRIN SR) 150 MG 12 hr tablet, Take 1 tablet (150 mg total) by mouth 2 (two) times daily., Disp: 60 tablet, Rfl: 3;  clopidogrel (PLAVIX) 75 MG tablet, Take 1 tablet (75 mg total) by mouth daily., Disp: 30 tablet, Rfl: 6 lisinopril-hydrochlorothiazide (PRINZIDE,ZESTORETIC) 20-12.5 MG per tablet, Take 1 tablet by mouth daily., Disp: 90 tablet, Rfl: 2;  metoprolol (LOPRESSOR) 25 MG tablet, Take 25 mg by mouth 2 (two) times daily.  , Disp: , Rfl: ;  nitroGLYCERIN (NITROSTAT) 0.4 MG SL tablet, Place 0.4 mg under the tongue every 5 (five) minutes as needed. If no relief by 3rd tab, call 911 , Disp: , Rfl:  nortriptyline (PAMELOR) 10 MG capsule, Take 1 capsule (10  mg total) by mouth at bedtime., Disp: 30 capsule, Rfl: 11;  pravastatin (PRAVACHOL) 40 MG tablet, Take 40 mg by mouth daily.  , Disp: , Rfl: ;  triamcinolone (KENALOG) 0.5 % cream, Apply topically 2 (two) times daily. Apply to affected area , Disp: , Rfl:   Family Hx:  Mother died at age 36 with brain and lung CA; father is living at age 22 with HTN/heart Dz  Social Hx:  She has 4 children; denies ETOH; positive for tobacco 1/2 ppd   Review of Systems  Constitutional: Negative.   HENT: Negative for hearing loss.   Eyes: Negative for visual disturbance.  Respiratory: Negative for apnea, chest tightness and shortness of breath.   Cardiovascular: Negative for chest pain.       Denies hx of MI  Gastrointestinal: Negative for blood in stool.       Denies melena, GERD, PUD  Genitourinary: Negative for dysuria, hematuria and difficulty urinating.  Musculoskeletal: Positive for back pain.       Lower back pain that she has been told is arthritis  Neurological: Negative for dizziness, seizures, light-headedness and headaches.  Hematological: Does not bruise/bleed easily.  Psychiatric/Behavioral: The patient is not nervous/anxious.        Objective:   Physical Exam  Constitutional: She is  oriented to person, place, and time. She appears well-developed and well-nourished.  HENT:  Head: Normocephalic.  Eyes: Pupils are equal, round, and reactive to light.  Neck: Normal range of motion. Neck supple.  Cardiovascular: Normal rate and regular rhythm.        + bilateral carotid bruits  Pulmonary/Chest: Effort normal and breath sounds normal.  Abdominal: Soft. Bowel sounds are normal.  Musculoskeletal: Normal range of motion.  Neurological: She is alert and oriented to person, place, and time.  Skin: Skin is warm and dry.  Psychiatric: She has a normal mood and affect. Her behavior is normal. Judgment and thought content normal.   Vascular:  2+ radial pulses Bilaterally 2+ DP signals  bilaterally 2+ PT signals bilaterally - edema BLE  Carotid Duplex Scan: Velocities suggest 40-59% of the bilateral ICAs, however velocities at the L-ICA may be elevated d/t a change in vessel diameter. No significant change from 12/05/2009    Assessment:     60 y/o BF presents today for f/u carotid duplex that has no significant change.  She is also c/o questionable claudication in her LLE.  I did discuss with the pt the need for tobacco cessation.    Plan:     Have her return to the clinic in 2-3 weeks for ABIs and an appointment to see Dr. Hart Rochester. Follow up in one year for carotid duplex scan.

## 2010-12-17 NOTE — Progress Notes (Signed)
This encounter was created in error - please disregard.

## 2010-12-20 LAB — LIPID PANEL: Cholesterol: 219 — ABNORMAL HIGH

## 2010-12-20 LAB — CARDIAC PANEL(CRET KIN+CKTOT+MB+TROPI)
CK, MB: 0.8
CK, MB: 0.9
Relative Index: 0.4

## 2010-12-20 LAB — RAPID URINE DRUG SCREEN, HOSP PERFORMED
Amphetamines: NOT DETECTED
Barbiturates: NOT DETECTED
Benzodiazepines: NOT DETECTED
Cocaine: NOT DETECTED
Opiates: NOT DETECTED
Tetrahydrocannabinol: NOT DETECTED

## 2010-12-20 LAB — CBC
HCT: 35.8 — ABNORMAL LOW
HCT: 35.1 — ABNORMAL LOW
Hemoglobin: 13.1
Hemoglobin: 13.3
Hemoglobin: 14.4
Hemoglobin: 12
Hemoglobin: 11.9 — ABNORMAL LOW
MCHC: 32.9
MCHC: 33.5
MCHC: 33.9
MCV: 81.5
MCV: 79.6
Platelets: 142 — ABNORMAL LOW
Platelets: 161
RBC: 4.89
RBC: 4.47
RDW: 15
RDW: 15.1
RDW: 15.1
RDW: 14.7
WBC: 10
WBC: 11.3 — ABNORMAL HIGH

## 2010-12-20 LAB — DIFFERENTIAL
Basophils Absolute: 0.1
Lymphocytes Relative: 23
Neutro Abs: 9 — ABNORMAL HIGH

## 2010-12-20 LAB — BASIC METABOLIC PANEL
CO2: 25
CO2: 25
Calcium: 9.5
GFR calc Af Amer: >60
GFR calc non Af Amer: >60
GFR calc non Af Amer: >60
Glucose, Bld: 95
Glucose, Bld: 114 — ABNORMAL HIGH
Potassium: 3.8
Potassium: 3.6
Potassium: 4
Sodium: 139
Sodium: 138
Sodium: 134 — ABNORMAL LOW

## 2010-12-20 LAB — COMPREHENSIVE METABOLIC PANEL
ALT: 18
AST: 20
CO2: 25
Calcium: 9.8
Chloride: 105
GFR calc Af Amer: >60
GFR calc non Af Amer: >60
Sodium: 139

## 2010-12-20 LAB — POCT CARDIAC MARKERS
Myoglobin, poc: 60.5
Myoglobin, poc: 65.2
Operator id: 264031
Operator id: 264031
Troponin i, poc: <0.05

## 2010-12-20 LAB — POCT I-STAT, CHEM 8
BUN: 10
Chloride: 107
HCT: 31 — ABNORMAL LOW
Hemoglobin: 10.5 — ABNORMAL LOW
Sodium: 137
Sodium: 140
TCO2: 27

## 2010-12-20 LAB — CK TOTAL AND CKMB (NOT AT ARMC): Total CK: 164

## 2010-12-20 LAB — URINALYSIS, ROUTINE W REFLEX MICROSCOPIC
Bilirubin Urine: NEGATIVE
Glucose, UA: NEGATIVE
Hgb urine dipstick: NEGATIVE
Specific Gravity, Urine: 1.003 — ABNORMAL LOW
Urobilinogen, UA: 0.2
pH: 6.5

## 2010-12-20 LAB — URINE MICROSCOPIC-ADD ON

## 2010-12-20 LAB — TSH: TSH: 1.562

## 2010-12-21 LAB — COMPREHENSIVE METABOLIC PANEL
ALT: 16
Calcium: 9.8
Glucose, Bld: 92
Sodium: 133 — ABNORMAL LOW
Total Protein: 7.3

## 2010-12-21 LAB — CBC
Hemoglobin: 12.3
MCHC: 34.6
RDW: 14.4

## 2010-12-21 LAB — DIFFERENTIAL
Basophils Absolute: 0.1
Eosinophils Absolute: 0.3
Eosinophils Relative: 4
Lymphs Abs: 1.2
Monocytes Absolute: 0.6
Neutrophils Relative %: 72

## 2010-12-21 LAB — URINALYSIS, ROUTINE W REFLEX MICROSCOPIC
Bilirubin Urine: NEGATIVE
Ketones, ur: NEGATIVE
Protein, ur: NEGATIVE
Urobilinogen, UA: 0.2

## 2010-12-21 LAB — URINE MICROSCOPIC-ADD ON

## 2010-12-21 LAB — URINE CULTURE: Colony Count: >=100000

## 2010-12-21 LAB — WET PREP, GENITAL: Yeast Wet Prep HPF POC: NONE SEEN

## 2010-12-24 HISTORY — PX: FEMORAL-POPLITEAL BYPASS GRAFT: SHX937

## 2010-12-24 NOTE — Procedures (Unsigned)
CAROTID DUPLEX EXAM  INDICATION:  Left carotid endarterectomy.  HISTORY: Diabetes:  No. Cardiac:  CAD. Hypertension:  Yes. Smoking:  Yes. Previous Surgery:  Left carotid endarterectomy on 11/20/2007. CV History:  Currently asymptomatic. Amaurosis Fugax No, Paresthesias No, Hemiparesis No.                                      RIGHT             LEFT Brachial systolic pressure:         146               142 Brachial Doppler waveforms:         Normal            Normal Vertebral direction of flow:        Not visualized    Not visualized DUPLEX VELOCITIES (cm/sec) CCA peak systolic                   190               136 ECA peak systolic                   167               176 ICA peak systolic                   138               123 ICA end diastolic                   44                40 PLAQUE MORPHOLOGY:                  Heterogenous      Heterogenous PLAQUE AMOUNT:                      Mild              Mild PLAQUE LOCATION:                    ICA/ECA/CCA       CCA  IMPRESSION: 1. Doppler velocities suggest 40% to 59% stenosis of the bilateral     proximal internal carotid arteries; however, velocities of the left     internal carotid artery appear to be elevated due to a change in     vessel diameter. 2. No significant change noted when compared to the previous     examination on 12/05/2009.  ___________________________________________ Quita Skye. Hart Rochester, M.D.  CH/MEDQ  D:  12/17/2010  T:  12/17/2010  Job:  [id number]

## 2010-12-26 ENCOUNTER — Encounter: Payer: Self-pay | Admitting: Family Medicine

## 2010-12-26 ENCOUNTER — Ambulatory Visit (INDEPENDENT_AMBULATORY_CARE_PROVIDER_SITE_OTHER): Payer: 59 | Admitting: Family Medicine

## 2010-12-26 VITALS — BP 138/70 | Temp 98.7°F | Ht 73.5 in | Wt 178.0 lb

## 2010-12-26 DIAGNOSIS — F17200 Nicotine dependence, unspecified, uncomplicated: Secondary | ICD-10-CM

## 2010-12-26 DIAGNOSIS — M545 Low back pain: Secondary | ICD-10-CM

## 2010-12-26 MED ORDER — HYDROCODONE-ACETAMINOPHEN 5-500 MG PO CAPS
1.0000 | ORAL_CAPSULE | Freq: Four times a day (QID) | ORAL | Status: AC | PRN
Start: 2010-12-26 — End: 2011-01-05

## 2010-12-26 NOTE — Progress Notes (Signed)
Low back pain. At present for several months now. Has been worked up in the past. Pain is located in the bilateral paraspinal area. Radiates to her gluteus is bilaterally sometimes going down to her legs. The pain is worse after working all day. Has pain in the morning when she does get up. She has been taking Tylenol which doesn't control her pain well.  She has taken hydrocodone the past which has helped. Has never had physical therapy.  In the interval also has seen her vascular surgeon. She has an arterial Doppler scheduled for her bilateral legs to evaluate for peripheral vascular disease.  PMH reviewed.  ROS as above otherwise neg Medications reviewed.  Exam:  BP 138/70  Temp(Src) 98.7 F (37.1 C) (Oral)  Ht 6' 1.5" (1.867 m)  Wt 178 lb (80.74 kg)  BMI 23.17 kg/m2 Gen: Well NAD HEENT: EOMI,  MMM Lungs: CTABL Nl WOB Heart: RRR no MRG Abd: NABS, NT, ND Exts: Non edematous BL  LE, warm and well perfused.  MSK: Nontender entire spinal midline. Bilateral lumbar paraspinal tenderness bilateral SI joint tenderness. Straight leg and Faber stretches negative pretzel stretch mildly positive. Reflexes equal bilaterally strength and sensation is intact. Gait is normal.

## 2010-12-26 NOTE — Assessment & Plan Note (Signed)
Chronic pain. No red flag signs or symptoms.  We'll provide limited number of hydrocodone and refer patient to physical therapy for home exercise teaching. We'll followup in several months.

## 2010-12-26 NOTE — Patient Instructions (Signed)
Thank you for coming in today. Make an appointment where we talk just about smoking. I prescribed Vicodin for back pain as needed. I also am setting up  physical therapy. With me know if he cannot get into physical therapy. Come back soon.

## 2010-12-26 NOTE — Assessment & Plan Note (Signed)
Significantly worsening her peripheral vascular disease. Patient is desiring to quit.  We'll follow up with me in several weeks for dedicated tobacco cessation visit.

## 2011-01-07 ENCOUNTER — Encounter: Payer: Self-pay | Admitting: Vascular Surgery

## 2011-01-08 ENCOUNTER — Encounter: Payer: Self-pay | Admitting: Vascular Surgery

## 2011-01-08 ENCOUNTER — Ambulatory Visit (INDEPENDENT_AMBULATORY_CARE_PROVIDER_SITE_OTHER): Payer: 59 | Admitting: Vascular Surgery

## 2011-01-08 VITALS — BP 133/67 | HR 81 | Resp 16 | Ht 74.5 in | Wt 178.0 lb

## 2011-01-08 DIAGNOSIS — I70219 Atherosclerosis of native arteries of extremities with intermittent claudication, unspecified extremity: Secondary | ICD-10-CM

## 2011-01-08 DIAGNOSIS — I739 Peripheral vascular disease, unspecified: Secondary | ICD-10-CM

## 2011-01-08 DIAGNOSIS — L98499 Non-pressure chronic ulcer of skin of other sites with unspecified severity: Secondary | ICD-10-CM

## 2011-01-08 DIAGNOSIS — Z48812 Encounter for surgical aftercare following surgery on the circulatory system: Secondary | ICD-10-CM

## 2011-01-08 NOTE — Progress Notes (Signed)
ABI done 01/08/2011 @[facility] 

## 2011-01-08 NOTE — Progress Notes (Signed)
Subjective:     Patient ID: Leah [name], female   DOB: November 14, 1950, 60 y.o.   MRN: [id number]  HPI this 60 year old female returns today for further evaluation of her severe claudication in the left leg he has previously had right iliac stenting by Dr. Eldridge Dace in the past. She has had progressive claudication symptoms in the left leg beginning in the hip and extending down into the left foot over the last several months. These begin less than one half block. He does not have rest pain. She does have an ulcer on the tip of her left fifth toe where she trimmed her toenail recently which has not healed. Her no symptoms in the contralateral right leg.  Past Medical History  Diagnosis Date  . Hyperlipidemia   . Hypertension   . GERD (gastroesophageal reflux disease)   . Leg pain   . Headache   . Carotid artery occlusion   . COPD (chronic obstructive pulmonary disease)   . CAD (coronary artery disease)   . Peripheral vascular disease     History  Substance Use Topics  . Smoking status: Current Everyday Smoker -- 0.5 packs/day for 35 years    Types: Cigarettes  . Smokeless tobacco: Never Used   Comment: has not started wellbutrin yet  . Alcohol Use: No    No family history on file.  No Known Allergies  Current outpatient prescriptions:aspirin 325 MG tablet, Take 325 mg by mouth daily.  , Disp: , Rfl: ;  buPROPion (WELLBUTRIN SR) 150 MG 12 hr tablet, Take 1 tablet (150 mg total) by mouth 2 (two) times daily., Disp: 60 tablet, Rfl: 3;  clopidogrel (PLAVIX) 75 MG tablet, Take 1 tablet (75 mg total) by mouth daily., Disp: 30 tablet, Rfl: 6 lisinopril-hydrochlorothiazide (PRINZIDE,ZESTORETIC) 20-12.5 MG per tablet, Take 1 tablet by mouth daily., Disp: 90 tablet, Rfl: 2;  nitroGLYCERIN (NITROSTAT) 0.4 MG SL tablet, Place 0.4 mg under the tongue every 5 (five) minutes as needed. If no relief by 3rd tab, call 911 , Disp: , Rfl: ;  nortriptyline (PAMELOR) 10 MG capsule, Take 1 capsule (10 mg  total) by mouth at bedtime., Disp: 30 capsule, Rfl: 11 pravastatin (PRAVACHOL) 40 MG tablet, Take 40 mg by mouth daily.  , Disp: , Rfl: ;  simvastatin (ZOCOR) 40 MG tablet, , Disp: , Rfl: ;  triamcinolone (KENALOG) 0.5 % cream, Apply topically 2 (two) times daily. Apply to affected area , Disp: , Rfl: ;  metoprolol (LOPRESSOR) 25 MG tablet, Take 25 mg by mouth 2 (two) times daily.  , Disp: , Rfl:   BP 133/67  Pulse 81  Resp 16  Ht 6' 2.5" (1.892 m)  Wt 178 lb (80.74 kg)  BMI 22.55 kg/m2  SpO2 99%  Body mass index is 22.55 kg/(m^2).        Review of Systems denies chest pain, dyspnea on exertion, PND, orthopnea, or hemoptysis. He does complain of weight loss, leg discomfort, arthritis, constipation, all other systems are negative and complete review of systems     Objective:   Physical Exam blood pressure 130/67 heart rate 81 respirations 16 HEENT normal for age Chest no rhonchi or wheezing Cardiovascular regular no murmurs carotid pulses 3+ no bruits heard Neurologic exam normal Abdomen soft nontender with no masses Skin free of rashes there is a dark area at the base of the left fifth nail with some superficial ulceration. Musculoskeletal free of obvious deformities Right leg has 3+ femoral and popliteal pulse palpable left leg  has 2-3+ femoral no distal pulses palpable  Today I ordered lower strandy arterial Doppler studies which I reviewed and interpreted. ABI on the left leg is 0.45 the right leg 1.08     Assessment:     Severe femoral popliteal occlusive disease on the left with nonhealing ulceration left fifth toe and severe claudication. Previous stenting of right iliac system appear    Plan:    #1 aortobifemoral angiography with possible PTA and stenting by Dr. Johny Drilling on October 18 #2 if unable to treat left superficial femoral system with PTA and stenting have scheduled for left femoral-popliteal bypass graft on Wednesday, October 24 on hospital

## 2011-01-09 ENCOUNTER — Other Ambulatory Visit (HOSPITAL_COMMUNITY): Payer: 59

## 2011-01-09 ENCOUNTER — Ambulatory Visit (HOSPITAL_COMMUNITY)
Admission: RE | Admit: 2011-01-09 | Discharge: 2011-01-09 | Disposition: A | Discharge status: Home / Self Care | Payer: 59 | Source: Ambulatory Visit | Attending: Vascular Surgery | Admitting: Vascular Surgery

## 2011-01-09 DIAGNOSIS — I70219 Atherosclerosis of native arteries of extremities with intermittent claudication, unspecified extremity: Secondary | ICD-10-CM | POA: Insufficient documentation

## 2011-01-09 LAB — COMPREHENSIVE METABOLIC PANEL
ALT: 9 U/L (ref 0–35)
AST: 13 U/L (ref 0–37)
Alkaline Phosphatase: 109 U/L (ref 39–117)
Calcium: 10 mg/dL (ref 8.4–10.5)
Glucose, Bld: 113 mg/dL — ABNORMAL HIGH (ref 70–99)
Potassium: 3.5 mEq/L (ref 3.5–5.1)
Sodium: 139 mEq/L (ref 135–145)
Total Protein: 6.4 g/dL (ref 6.0–8.3)

## 2011-01-09 LAB — POCT I-STAT, CHEM 8
BUN: 23 mg/dL (ref 6–23)
Calcium, Ion: 1.21 mmol/L (ref 1.12–1.32)
Creatinine, Ser: 1.1 mg/dL (ref 0.50–1.10)
Glucose, Bld: 98 mg/dL (ref 70–99)
Hemoglobin: 13.9 g/dL (ref 12.0–15.0)
TCO2: 25 mmol/L (ref 0–100)

## 2011-01-09 LAB — PROTIME-INR: Prothrombin Time: 13.6 seconds (ref 11.6–15.2)

## 2011-01-09 LAB — CBC
MCH: 26.2 pg (ref 26.0–34.0)
MCHC: 32.4 g/dL (ref 30.0–36.0)
MCV: 80.8 fL (ref 78.0–100.0)
Platelets: 174 10*3/uL (ref 150–400)

## 2011-01-10 ENCOUNTER — Telehealth: Payer: Self-pay | Admitting: Family Medicine

## 2011-01-10 NOTE — Telephone Encounter (Signed)
Advised pt to call and CA appt and RS it so they can keep her in the system.Gave Pt #. She agreed.

## 2011-01-10 NOTE — Telephone Encounter (Signed)
Pt called to say she is having major surgery next week and needs to cancel her PT appt that is scheduled for Monday

## 2011-01-11 ENCOUNTER — Ambulatory Visit: Payer: 59 | Admitting: Family Medicine

## 2011-01-14 ENCOUNTER — Ambulatory Visit: Payer: 59 | Admitting: Rehabilitative and Restorative Service Providers"

## 2011-01-16 ENCOUNTER — Inpatient Hospital Stay (HOSPITAL_COMMUNITY)
Admission: RE | Admit: 2011-01-16 | Discharge: 2011-01-19 | DRG: 253 | Disposition: A | Discharge status: Home / Self Care | Payer: 59 | Source: Ambulatory Visit | Attending: Vascular Surgery | Admitting: Vascular Surgery

## 2011-01-16 ENCOUNTER — Other Ambulatory Visit: Payer: Self-pay | Admitting: Vascular Surgery

## 2011-01-16 ENCOUNTER — Ambulatory Visit (HOSPITAL_COMMUNITY)
Admission: RE | Admit: 2011-01-16 | Discharge: 2011-01-16 | Disposition: A | Discharge status: Home / Self Care | Payer: 59 | Source: Ambulatory Visit | Attending: Vascular Surgery | Admitting: Vascular Surgery

## 2011-01-16 ENCOUNTER — Inpatient Hospital Stay (HOSPITAL_COMMUNITY): Payer: 59

## 2011-01-16 DIAGNOSIS — Z23 Encounter for immunization: Secondary | ICD-10-CM

## 2011-01-16 DIAGNOSIS — Z9861 Coronary angioplasty status: Secondary | ICD-10-CM

## 2011-01-16 DIAGNOSIS — I2510 Atherosclerotic heart disease of native coronary artery without angina pectoris: Secondary | ICD-10-CM | POA: Diagnosis present

## 2011-01-16 DIAGNOSIS — K219 Gastro-esophageal reflux disease without esophagitis: Secondary | ICD-10-CM | POA: Diagnosis present

## 2011-01-16 DIAGNOSIS — I708 Atherosclerosis of other arteries: Secondary | ICD-10-CM | POA: Diagnosis present

## 2011-01-16 DIAGNOSIS — L98499 Non-pressure chronic ulcer of skin of other sites with unspecified severity: Secondary | ICD-10-CM

## 2011-01-16 DIAGNOSIS — D62 Acute posthemorrhagic anemia: Secondary | ICD-10-CM | POA: Diagnosis not present

## 2011-01-16 DIAGNOSIS — J4489 Other specified chronic obstructive pulmonary disease: Secondary | ICD-10-CM | POA: Diagnosis present

## 2011-01-16 DIAGNOSIS — I701 Atherosclerosis of renal artery: Secondary | ICD-10-CM | POA: Diagnosis present

## 2011-01-16 DIAGNOSIS — I6529 Occlusion and stenosis of unspecified carotid artery: Secondary | ICD-10-CM | POA: Diagnosis present

## 2011-01-16 DIAGNOSIS — I10 Essential (primary) hypertension: Secondary | ICD-10-CM | POA: Diagnosis present

## 2011-01-16 DIAGNOSIS — E785 Hyperlipidemia, unspecified: Secondary | ICD-10-CM | POA: Diagnosis present

## 2011-01-16 DIAGNOSIS — J449 Chronic obstructive pulmonary disease, unspecified: Secondary | ICD-10-CM | POA: Diagnosis present

## 2011-01-16 DIAGNOSIS — I739 Peripheral vascular disease, unspecified: Secondary | ICD-10-CM

## 2011-01-16 DIAGNOSIS — F17200 Nicotine dependence, unspecified, uncomplicated: Secondary | ICD-10-CM | POA: Diagnosis present

## 2011-01-16 DIAGNOSIS — Z79899 Other long term (current) drug therapy: Secondary | ICD-10-CM

## 2011-01-16 DIAGNOSIS — J9819 Other pulmonary collapse: Secondary | ICD-10-CM | POA: Diagnosis not present

## 2011-01-16 DIAGNOSIS — L97509 Non-pressure chronic ulcer of other part of unspecified foot with unspecified severity: Secondary | ICD-10-CM | POA: Diagnosis present

## 2011-01-17 DIAGNOSIS — I70269 Atherosclerosis of native arteries of extremities with gangrene, unspecified extremity: Secondary | ICD-10-CM

## 2011-01-17 DIAGNOSIS — Z48812 Encounter for surgical aftercare following surgery on the circulatory system: Secondary | ICD-10-CM

## 2011-01-17 DIAGNOSIS — L97509 Non-pressure chronic ulcer of other part of unspecified foot with unspecified severity: Secondary | ICD-10-CM

## 2011-01-17 LAB — BASIC METABOLIC PANEL
BUN: 14 mg/dL (ref 6–23)
CO2: 27 mEq/L (ref 19–32)
Calcium: 8.9 mg/dL (ref 8.4–10.5)
Chloride: 102 mEq/L (ref 96–112)
Creatinine, Ser: 1.14 mg/dL — ABNORMAL HIGH (ref 0.50–1.10)
Glucose, Bld: 109 mg/dL — ABNORMAL HIGH (ref 70–99)

## 2011-01-17 LAB — CBC
HCT: 25.5 % — ABNORMAL LOW (ref 36.0–46.0)
Hemoglobin: 8.2 g/dL — ABNORMAL LOW (ref 12.0–15.0)
MCH: 26.1 pg (ref 26.0–34.0)
MCV: 81.2 fL (ref 78.0–100.0)
RBC: 3.14 MIL/uL — ABNORMAL LOW (ref 3.87–5.11)
WBC: 9.5 10*3/uL (ref 4.0–10.5)

## 2011-01-18 LAB — URINALYSIS, ROUTINE W REFLEX MICROSCOPIC
Bilirubin Urine: NEGATIVE
Glucose, UA: NEGATIVE mg/dL
Hgb urine dipstick: NEGATIVE
Ketones, ur: NEGATIVE mg/dL
Protein, ur: NEGATIVE mg/dL
pH: 5.5 (ref 5.0–8.0)

## 2011-01-18 LAB — URINE MICROSCOPIC-ADD ON

## 2011-01-19 LAB — URINE CULTURE: Culture  Setup Time: 201210260518

## 2011-01-19 LAB — BASIC METABOLIC PANEL
CO2: 26 mEq/L (ref 19–32)
Calcium: 9.6 mg/dL (ref 8.4–10.5)
GFR calc non Af Amer: 54 mL/min — ABNORMAL LOW (ref >90)
Potassium: 4.1 mEq/L (ref 3.5–5.1)
Sodium: 138 mEq/L (ref 135–145)

## 2011-01-19 LAB — CBC
MCH: 26.4 pg (ref 26.0–34.0)
Platelets: 131 10*3/uL — ABNORMAL LOW (ref 150–400)
RBC: 3.37 MIL/uL — ABNORMAL LOW (ref 3.87–5.11)

## 2011-01-21 LAB — CROSSMATCH
ABO/RH(D): O POS
Unit division: 0
Unit division: 0

## 2011-01-22 NOTE — Op Note (Signed)
Leah [name], Leah [name]            ACCOUNT NO.:  [id number]  MEDICAL RECORD NO.:  [id number]  LOCATION:  [facility]                         FACILITY:  [facility]  PHYSICIAN:  Juleen China IV, MDDATE OF BIRTH:  1950-06-10  DATE OF PROCEDURE:  01/09/2011 DATE OF DISCHARGE:                              OPERATIVE REPORT   PREOPERATIVE DIAGNOSIS:  Left leg claudication.  POSTOPERATIVE DIAGNOSIS:  Left leg claudication.  PROCEDURES PERFORMED: 1. Ultrasound access, right femoral artery. 2. Abdominal aortogram. 3. Bilateral lower extremity runoff. 4. Second order catheterization.  SURGEON: 1. Durene Cal IV, MD  INDICATION:  This is a 60 year old female who has previously undergone prior interventions for claudication.  She has had worsening symptoms in her left leg.  She comes in today for angiogram and possible intervention.  PROCEDURE IN DETAIL:  The patient was identified in the holding area, taken to room 8, and placed supine on the table.  Both groins were prepped and draped in usual fashion.  Time-out was called.  The right femoral artery was evaluated with ultrasound and found to be widely patent.  The right femoral artery was accessed under ultrasound guidance after digital ultrasound image was acquired.  A 0.035 wire was advanced into the aorta under fluoroscopic visualization.  A 5-French sheath was placed over the wire and Omniflush catheter was advanced to the level of L1.  Abdominal aortogram was obtained.  Next, the catheter was pulled down to the aortic bifurcation and pelvic angiogram was performed. Next, using the Omni flush catheter and Bentson wire, the aortic bifurcation was crossed, catheter placed in the left external iliac artery and left leg runoff was performed.  Pullback pressures were then the calculated across the left inflow system, then retrograde injections through the sheath were performed to evaluate the right leg.  FINDINGS:  Aortogram:  The  visualized portions of the suprarenal abdominal aorta showed no significant aortic stenosis.  There are bilateral renal artery stents.  There is in-stent stenosis of approximately 50% on the right and 50% on the left.  The infrarenal abdominal aorta is ectatic and calcified.  There are no significant stenosis.  Pelvic angiogram:  A right common iliac stent was visualized and is widely patent.  The right external iliac artery is widely patent.  There is a high-grade stenosis at the origin of the right hypogastric artery. The left common iliac artery is widely patent.  The left hypogastric artery has a origin stenosis of approximately 50%.  The left external iliac artery is uniformly small without focal stenosis.  Right lower extremity: The right common femoral artery is widely patent. The right profunda femoral artery is widely patent.  The right superficial femoral artery is occluded.  There is reconstitution of the distal superficial femoral artery.  Popliteal artery is patent throughout its course.  There is 3-vessel runoff to the ankle.  The posterior tibial was the dominant vessel across the foot.  The anterior tibial artery does cross the ankle.  I then performed pullback pressures beginning in the left common femoral artery up into the left external iliac artery.  There was approximately a 25-mm gradient.  I then took multiple images in multiple oblique views.  I  felt that the artery was uniformly narrow without focal stenosis with a palpable pulse, so I elected not to treat this today.  Right lower extremity: The right common femoral artery is widely patent. The right profunda femoral artery is widely patent.  The right superficial femoral artery is widely patent.  The right popliteal artery is widely patent.  There is three-vessel runoff.  However, the peroneal and posterior tibial arteries are the dominant vessels.  The above images were obtained.  The decision was made to  terminate the procedure.  Catheters and wires were removed.  The patient will be taken to the holding area for sheath pull.  IMPRESSION: 1. Bilateral renal artery stent and stent stenosis. 2. The left external iliac artery was small in caliber.  There was a     pressure gradient of approximately 25 mm between the common femoral     and at origin of the external iliac artery, however, this was not     treated due to the uniform size of the artery as well as the fact     that she has a palpable femoral pulse. 3. Occluded left superficial femoral artery with reconstitution of the     distal superficial femoral artery and 3 vessel runoff.     Jorge Ny, MD     VWB/MEDQ  D:  01/09/2011  T:  01/09/2011  Job:  [id number]  Electronically Signed by Arelia Longest IV MD on 01/22/2011 09:47:26 PM

## 2011-01-22 NOTE — Op Note (Signed)
Leah [name], Leah [name]            ACCOUNT NO.:  [id number]  MEDICAL RECORD NO.:  [id number]  LOCATION:                                 FACILITY:  PHYSICIAN:  Quita Skye. Hart Rochester, M.D.  DATE OF BIRTH:  [birth year]  DATE OF PROCEDURE:  01/16/2011 DATE OF DISCHARGE:                              OPERATIVE REPORT   PREOPERATIVE DIAGNOSIS:  Ischemic ulcer left fifth toe secondary to external iliac, femoral popliteal occlusive disease.  POSTOPERATIVE DIAGNOSIS:  Ischemic ulcer left fifth toe secondary to external iliac, femoral popliteal occlusive disease.  OPERATION: 1. Left common femoral-to-popliteal (below knee) bypass using a     nonreversed translocated saphenous vein graft from left leg. 2. Intraoperative arteriogram.  SURGEON:  Quita Skye. Hart Rochester, MD  FIRST ASSISTANT:  Newton Pigg, [place]  ANESTHESIA:  General endotracheal.  PROCEDURE:  The patient was taken to the operating room, placed in supine position, at which time satisfactory general endotracheal anesthesia was administered.  The left leg was prepped with Betadine scrub and solution and draped in routine sterile manner.  Saphenous vein was exposed at the saphenofemoral junction.  It was a good vein.  It was then removed using multiple incisions along the medial aspect of the left leg down to the mid calf area.  Its branches were ligated with 3-0 and 4-0 silk ties and divided.  It was gently dilated with heparinized saline and marked for orientation purposes.  It was a very good vein being at least 2.5 to 3 mm in size in its smallest area.  The common superficial and profunda femoris arteries were dissected free in the inguinal incision.  There was a plaque throughout the common femoral artery, but there was a moderately good pulse with known external iliac disease diffusely proximally.  Popliteal artery was then exposed in the below-knee position.  There was a pulseless vessel that was relatively free of disease  and encircled with vessel loops.  Subfascial anatomic tunnel was created, and the patient was heparinized.  Femoral vessels were occluded with vascular clamps.  Longitudinal opening made in the common femoral artery with 15 blade and extended with Potts scissors. Proximal end of the vein was spatulated and anastomosed end-to-side with 6-0 Prolene.  Clamps then released.  There was a good pulse down the first set of competent valves.  Using retrograde valvulotome, the valves were rendered incompetent with resultant excellent flow out the distal end of the vein graft.  Popliteal artery was occluded proximally and distally with vessel loops opened with 15 blade, extended with Potts scissors.  Vein was carefully measured, spatulated, and anastomosed end- to-side with 6-0 Prolene.  Clamps then released.  There was an excellent pulse in the vein graft and good Doppler flow in the posterior tibial artery and the dorsalis pedis, posterior tibial being the best. Intraoperative arteriogram revealed a widely patent vein graft to the below-knee popliteal artery with 3 vessel runoff best vessel being the posterior tibial.  No protamine was given.  The wound was irrigated with saline, closed in layers with Vicryl in a subcuticular fashion with Dermabond.  The patient taken to the recovery room in satisfactory condition.     [name], M.D.  JDL/MEDQ  D:  01/16/2011  T:  01/16/2011  Job:  [id number]  Electronically Signed by Josephina Gip M.D. on 01/22/2011 10:31:47 AM

## 2011-01-28 NOTE — Discharge Summary (Signed)
[name], [name]            ACCOUNT NO.:  [id number]  MEDICAL RECORD NO.:  [id number]  LOCATION:                                 FACILITY:  PHYSICIAN:  Quita Skye. Hart Rochester, M.D.  DATE OF BIRTH:  [birth year]  DATE OF ADMISSION:  01/16/2011 DATE OF DISCHARGE:  01/19/2011                              DISCHARGE SUMMARY   ADMIT DIAGNOSIS:  Left lower extremity claudication with ischemic ulcer, left fifth toe.  PAST MEDICAL HISTORY AND DISCHARGE DIAGNOSES: 1. Claudication left lower extremity with ischemic ulcer left fifth     toe, status post left femoral to below-the-knee popliteal bypass     with vein. 2. Hyperlipidemia. 3. Hypertension. 4. Gastroesophageal reflux disease. 5. Headache. 6. Carotid artery occlusion. 7. Chronic obstructive pulmonary disease. 8. Coronary artery disease. 9. Peripheral vascular disease. 10.Renal artery stenosis. 11.Status post right external iliac artery stent by Dr. Eldridge Dace. 12.Bilateral renal artery stents. 13.Coronary artery stent.  BRIEF HISTORY:  The patient is a 60 year old female who has been followed by Dr. Hart Rochester as an outpatient for severe claudication in the left lower extremity.  She previously had a right iliac stent by Dr. Eldridge Dace.  The patient has had progressive claudication symptoms in the left leg that begin in the hip and extend down into the foot which had progressed over a period of several months.  She did not have rest pain.  She does have an ulcer on the tip of her left fifth toe which has not healed.  Secondary to these findings, Dr. Hart Rochester recommended the patient proceed with arteriogram, which was performed by Dr. Myra Gianotti on January 09, 2011.  The patient was found to have an occluded left SFA with reconstitution of the distal SFA and three-vessel runoff, therefore she was scheduled for a left fem-pop.  HOSPITAL COURSE:  The patient was admitted and taken to the OR on January 16, 2011 for a left common femoral to  below-the-knee popliteal artery bypass with non-reversed translocated greater saphenous vein from the left leg.  The patient tolerated the procedure well and was hemodynamically stable immediately postoperatively.  She was transferred from the OR to the postanesthesia care unit in stable condition.  The patient was extubated without complication and woke up from anesthesia neurologically intact.  The patient's early postoperative course was complicated by asymptomatic hypotension for which her blood pressure medications were held.  They were subsequently restarted and she has remained stable from that point.  She also had an acute blood loss anemia for which she was transfused 1 unit of packed RBCs and her hemoglobin and hematocrit have again stabilized.  Her postoperative course has otherwise remained uneventful.  She had an early postop fever, which resolved with incentive spirometry.  She is ambulating,  voiding, and tolerating a regular diet all without difficulty.  On January 19, 2011, the patient is without complaint.  She is afebrile with stable vital signs.  PHYSICAL EXAMINATION:  CARDIAC:  Regular rate and rhythm. LUNGS:  Clear to auscultation. EXTREMITIES:  The incisions are clean, dry, and intact with no evidence of erythema.  There is minimal ecchymosis present.  There is a palpable popliteal pulse.  The patient is in stable condition  at this time and felt ready for discharge home.  LABORATORY DATA:  CBC and BMP on January 19, 2011, white count 9.9, hemoglobin 8.9, hematocrit 27.2, platelets 131, sodium 138, potassium 4.1, BUN 15, creatinine 1.1.  The patient received specific written discharge instructions regarding diet, activity, and wound care.  She will follow up with Dr. Hart Rochester in approximately 2 weeks after discharge.  DISCHARGE MEDICATIONS: 1. Plavix 75 mg 1 p.o. daily. 2. Hydrocodone/acetaminophen 5/500 mg 1 p.o. q.6 h. p.r.n. pain, #30     tablets, no  refills provided. 3. Lisinopril/HCTZ 20/12.5 mg daily. 4. Nortriptyline 10 mg at bedtime. 5. Simvastatin 40 mg at bedtime.     Leah Leisure, PA   ______________________________ [name], M.D.    AY/MEDQ  D:  01/19/2011  T:  01/19/2011  Job:  [id number]  Electronically Signed by Leah Leisure PA on 01/22/2011 01:27:56 PM Electronically Signed by Josephina Gip M.D. on 01/28/2011 04:54:09 PM

## 2011-02-04 ENCOUNTER — Encounter: Payer: Self-pay | Admitting: Vascular Surgery

## 2011-02-05 ENCOUNTER — Encounter: Payer: Self-pay | Admitting: Vascular Surgery

## 2011-02-05 ENCOUNTER — Ambulatory Visit (INDEPENDENT_AMBULATORY_CARE_PROVIDER_SITE_OTHER): Payer: 59 | Admitting: Vascular Surgery

## 2011-02-05 VITALS — BP 153/61 | HR 84 | Resp 18 | Ht 74.5 in | Wt 178.0 lb

## 2011-02-05 DIAGNOSIS — I70219 Atherosclerosis of native arteries of extremities with intermittent claudication, unspecified extremity: Secondary | ICD-10-CM

## 2011-02-05 NOTE — Progress Notes (Signed)
Subjective:     Patient ID: ROLLA SERVIDIO, female   DOB: 09-16-1950, 60 y.o.   MRN: [id number]  HPI this 60 year old female returns for initial followup 3 weeks post left femoral-popliteal bypass graft using a nonreversible vein graft for severe claudication and a nonhealing ulcer on the tip of the left fifth toe. The toe ulcer has healed and she states that her claudication symptoms have resolved completely. She has had some mild swelling and some numbness in the pretibial region.  Review of Systems     Objective:   Physical ExamBP 153/61  Pulse 84  Resp 18  Ht 6' 2.5" (1.892 m)  Wt 178 lb (80.74 kg)  BMI 22.55 kg/m2 General she is alert and oriented x3 in no apparent distress Lower extremity exam on the left reveals 3+ femoral popliteal and 2+ dorsalis pedis pulse palpable. All incisions have healed nicely and the left leg. There is 1+ edema distally.    Assessment:    nicely functioning left femoral-popliteal saphenous vein graft. Resolution of claudication symptoms and healing of left fifth toe ulcer    Plan:     Return in 3 months for duplex scan of left femoral-popliteal bypass graft and ABIs. We'll also need continued surveillance of carotid artery occlusive disease

## 2011-03-14 ENCOUNTER — Other Ambulatory Visit: Payer: Self-pay | Admitting: Family Medicine

## 2011-03-14 MED ORDER — CLOPIDOGREL BISULFATE 75 MG PO TABS: 75.0000 mg | ORAL_TABLET | Freq: Every day | ORAL | Status: DC

## 2011-05-20 ENCOUNTER — Encounter: Payer: Self-pay | Admitting: Vascular Surgery

## 2011-05-21 ENCOUNTER — Ambulatory Visit: Payer: 59 | Admitting: Vascular Surgery

## 2011-05-23 ENCOUNTER — Encounter (INDEPENDENT_AMBULATORY_CARE_PROVIDER_SITE_OTHER): Payer: 59 | Admitting: *Deleted

## 2011-05-23 DIAGNOSIS — I70509 Unspecified atherosclerosis of nonautologous biological bypass graft(s) of the extremities, unspecified extremity: Secondary | ICD-10-CM

## 2011-05-23 DIAGNOSIS — Z48812 Encounter for surgical aftercare following surgery on the circulatory system: Secondary | ICD-10-CM

## 2011-05-29 ENCOUNTER — Encounter: Payer: Self-pay | Admitting: Vascular Surgery

## 2011-05-29 ENCOUNTER — Other Ambulatory Visit: Payer: Self-pay | Admitting: *Deleted

## 2011-05-29 DIAGNOSIS — I739 Peripheral vascular disease, unspecified: Secondary | ICD-10-CM

## 2011-05-29 DIAGNOSIS — Z48812 Encounter for surgical aftercare following surgery on the circulatory system: Secondary | ICD-10-CM

## 2011-05-29 NOTE — Procedures (Unsigned)
BYPASS GRAFT EVALUATION  INDICATION:  Follow up left lower extremity.  HISTORY: Diabetes:  No. Cardiac:  Coronary artery disease. Hypertension:  Yes. Smoking:  Yes. Previous Surgery:  Left carotid endarterectomy, 11/20/2007.  Left femoral to popliteal bypass graft with nonreversed saphenous vein, 01/16/2011.  SINGLE LEVEL ARTERIAL EXAM                              RIGHT              LEFT Brachial: Anterior tibial: Posterior tibial: Peroneal: Ankle/brachial index:        1.07               0.76  PREVIOUS ABI:  Date: 01/08/11  RIGHT:  1.08  LEFT:  0.45  LOWER EXTREMITY BYPASS GRAFT DUPLEX EXAM:  DUPLEX:  Monophasic waveforms proximal to, throughout bypass graft and on distally in the left lower extremity without visualized stenosis.  IMPRESSION: 1. Left femoral to popliteal bypass graft with monophasic waveforms     throughout the left lower extremity. 2. Increase in left ankle brachial index postoperatively.  ___________________________________________ [name], M.D.  SS/MEDQ  D:  05/23/2011  T:  05/23/2011  Job:  [id number]

## 2011-05-30 ENCOUNTER — Other Ambulatory Visit: Payer: Self-pay | Admitting: Family Medicine

## 2011-06-03 ENCOUNTER — Encounter: Payer: Self-pay | Admitting: Vascular Surgery

## 2011-06-04 ENCOUNTER — Encounter: Payer: Self-pay | Admitting: Vascular Surgery

## 2011-06-04 ENCOUNTER — Ambulatory Visit (INDEPENDENT_AMBULATORY_CARE_PROVIDER_SITE_OTHER): Payer: 59 | Admitting: Vascular Surgery

## 2011-06-04 VITALS — BP 137/85 | HR 87 | Resp 18 | Ht 74.0 in | Wt 180.0 lb

## 2011-06-04 DIAGNOSIS — I739 Peripheral vascular disease, unspecified: Secondary | ICD-10-CM | POA: Insufficient documentation

## 2011-06-04 DIAGNOSIS — I70219 Atherosclerosis of native arteries of extremities with intermittent claudication, unspecified extremity: Secondary | ICD-10-CM

## 2011-06-04 NOTE — Progress Notes (Signed)
Subjective:     Patient ID: Leah [name], female   DOB: 07/27/1950, 61 y.o.   MRN: [id number]  HPI this 61 year old female had left femoral popliteal bypass graft performed by me 01/16/2011. Her left calf claudication symptoms have resolved. She is having some left foot pain on occasion. She is concerned about that. She has no symptoms in the contralateral right leg. Denies any neurologic symptoms such as lateralizing weakness, aphasia, amaurosis fugax, diplopia, blurred vision, or syncope.  Past Medical History  Diagnosis Date  . Hyperlipidemia   . Hypertension   . GERD (gastroesophageal reflux disease)   . Leg pain   . Headache   . Carotid artery occlusion   . COPD (chronic obstructive pulmonary disease)   . CAD (coronary artery disease)   . Peripheral vascular disease     History  Substance Use Topics  . Smoking status: Current Everyday Smoker -- 0.5 packs/day for 35 years    Types: Cigarettes  . Smokeless tobacco: Never Used   Comment: has not started wellbutrin yet  . Alcohol Use: No    Family History  Problem Relation Age of Onset  . Cancer Mother     BRAIN AND LUNG  . Hypertension Father     No Known Allergies  Current outpatient prescriptions:aspirin 325 MG tablet, Take 325 mg by mouth daily.  , Disp: , Rfl: ;  buPROPion (WELLBUTRIN SR) 150 MG 12 hr tablet, Take 1 tablet (150 mg total) by mouth 2 (two) times daily., Disp: 60 tablet, Rfl: 3;  clopidogrel (PLAVIX) 75 MG tablet, Take 1 tablet (75 mg total) by mouth daily., Disp: 30 tablet, Rfl: 6 lisinopril-hydrochlorothiazide (PRINZIDE,ZESTORETIC) 20-12.5 MG per tablet, Take 1 tablet by mouth daily., Disp: 90 tablet, Rfl: 2;  metoprolol (LOPRESSOR) 25 MG tablet, Take 25 mg by mouth 2 (two) times daily.  , Disp: , Rfl: ;  nitroGLYCERIN (NITROSTAT) 0.4 MG SL tablet, Place 0.4 mg under the tongue every 5 (five) minutes as needed. If no relief by 3rd tab, call 911 , Disp: , Rfl:  pravastatin (PRAVACHOL) 40 MG tablet,  Take 40 mg by mouth daily.  , Disp: , Rfl: ;  simvastatin (ZOCOR) 40 MG tablet, , Disp: , Rfl: ;  triamcinolone (KENALOG) 0.5 % cream, Apply topically 2 (two) times daily. Apply to affected area , Disp: , Rfl:   BP 137/85  Pulse 87  Resp 18  Ht 6\' 2"  (1.88 m)  Wt 180 lb (81.647 kg)  BMI 23.11 kg/m2  Body mass index is 23.11 kg/(m^2).            Review of Systems     Objective:   Physical Exam pressure 137/85 heart rate 87 respirations 18 Gen.-alert and oriented x3 in no apparent distress HEENT normal for age Lungs no rhonchi or wheezing Cardiovascular regular rhythm no murmurs carotid pulses 3+ palpable no bruits audible Abdomen soft nontender no palpable masses Musculoskeletal free of  major deformities Skin clear -no rashes Neurologic normal Lower extremities 3+ femoral and dorsalis pedis pulses palpable bilaterally with no edema left leg incisions have healed nicely. No evidence of ischemia left foot  Scan of the bypass performed on 05/23/2011 revealed it to be widely patent. ABI has improved from 0.4 5.to .76There are no areas of high velocity in the vein graft     Assessment:     Nicely functioning left femoral popliteal saphenous vein graft with resolution of claudication Left foot pain etiology unknown-not severe    Plan:  Continue to monitor bypass on regular basis.

## 2011-06-10 ENCOUNTER — Encounter (HOSPITAL_COMMUNITY): Payer: Self-pay | Admitting: *Deleted

## 2011-06-10 ENCOUNTER — Emergency Department (HOSPITAL_COMMUNITY)
Admission: EM | Admit: 2011-06-10 | Discharge: 2011-06-10 | Disposition: A | Discharge status: Home / Self Care | Payer: 59 | Attending: Emergency Medicine | Admitting: Emergency Medicine

## 2011-06-10 ENCOUNTER — Other Ambulatory Visit: Payer: Self-pay | Admitting: Family Medicine

## 2011-06-10 ENCOUNTER — Telehealth: Payer: Self-pay | Admitting: Family Medicine

## 2011-06-10 DIAGNOSIS — J449 Chronic obstructive pulmonary disease, unspecified: Secondary | ICD-10-CM | POA: Insufficient documentation

## 2011-06-10 DIAGNOSIS — R1310 Dysphagia, unspecified: Secondary | ICD-10-CM | POA: Insufficient documentation

## 2011-06-10 DIAGNOSIS — T783XXA Angioneurotic edema, initial encounter: Secondary | ICD-10-CM

## 2011-06-10 DIAGNOSIS — I10 Essential (primary) hypertension: Secondary | ICD-10-CM | POA: Insufficient documentation

## 2011-06-10 DIAGNOSIS — K219 Gastro-esophageal reflux disease without esophagitis: Secondary | ICD-10-CM | POA: Insufficient documentation

## 2011-06-10 DIAGNOSIS — J4489 Other specified chronic obstructive pulmonary disease: Secondary | ICD-10-CM | POA: Insufficient documentation

## 2011-06-10 DIAGNOSIS — E785 Hyperlipidemia, unspecified: Secondary | ICD-10-CM | POA: Insufficient documentation

## 2011-06-10 DIAGNOSIS — Z79899 Other long term (current) drug therapy: Secondary | ICD-10-CM | POA: Insufficient documentation

## 2011-06-10 DIAGNOSIS — R221 Localized swelling, mass and lump, neck: Secondary | ICD-10-CM | POA: Insufficient documentation

## 2011-06-10 DIAGNOSIS — Z7982 Long term (current) use of aspirin: Secondary | ICD-10-CM | POA: Insufficient documentation

## 2011-06-10 DIAGNOSIS — I739 Peripheral vascular disease, unspecified: Secondary | ICD-10-CM | POA: Insufficient documentation

## 2011-06-10 DIAGNOSIS — X58XXXA Exposure to other specified factors, initial encounter: Secondary | ICD-10-CM | POA: Insufficient documentation

## 2011-06-10 DIAGNOSIS — I2510 Atherosclerotic heart disease of native coronary artery without angina pectoris: Secondary | ICD-10-CM | POA: Insufficient documentation

## 2011-06-10 DIAGNOSIS — R220 Localized swelling, mass and lump, head: Secondary | ICD-10-CM | POA: Insufficient documentation

## 2011-06-10 MED ORDER — HYDROCHLOROTHIAZIDE 25 MG PO TABS: 25.0000 mg | ORAL_TABLET | Freq: Every day | ORAL | Status: DC

## 2011-06-10 MED ORDER — NORTRIPTYLINE HCL 10 MG PO CAPS: 10.0000 mg | ORAL_CAPSULE | Freq: Every day | ORAL | Status: DC

## 2011-06-10 NOTE — Telephone Encounter (Signed)
Pt went to [facility] and was told she had an allergic reaction to lisinopril-hydrochlorothiazide Needs something different called in.  [facility] [place]

## 2011-06-10 NOTE — Discharge Instructions (Signed)
Angioedema Angioedema (AE) is sudden puffiness (swelling) of the skin. It can happen to the face, genitals, and other body parts. You may be reacting to something you are sensitive to (allergic reaction). It may have been passed to you from your parents (hereditary), or it may develop by itself (acquired). You may also get red itchy patches of skin (hives). Attacks may be mild. Some attacks are life-threatening. Most often, the puffiness happens fast. It often gets better in 24 to 48 hours.  HOME CARE  Carry your emergency allergy medicines with you.   Wear a medical bracelet.   Avoid things that you know will cause this reaction (triggers).  GET HELP RIGHT AWAY IF:   You have trouble breathing.   You have trouble swallowing.   You pass out (faint).   You have another attack.   Your attacks happen more often or get worse.   AE was passed to you by your parents and you want to start having children.  MAKE SURE YOU:   Understand these instructions.   Will watch your condition.   Will get help right away if you are not doing well or get worse.  Document Released: 02/27/2009 Document Revised: 02/28/2011 Document Reviewed: 02/27/2009 [facility] Patient Information 2012 [place], [place].    Stop lisinopril-HCTZ, as it is the likely agent that caused your lipids well. Call Dr. Denyse Amass today asked to be placed on a different class of blood pressure medications. Return if her condition worsens for any reason

## 2011-06-10 NOTE — Telephone Encounter (Signed)
Called pt about her angioedema. She is well. Advised stop lisinopril. I wrote rx for HCTZ.  Will see her in my clinic next week.

## 2011-06-10 NOTE — ED Provider Notes (Signed)
History     CSN: [id number]  Arrival date & time 06/10/11  1055   First MD Initiated Contact with Patient 06/10/11 1143      Chief Complaint  Patient presents with  . Oral Swelling    (Consider location/radiation/quality/duration/timing/severity/associated sxs/prior treatment) HPI Complains of swelling at upper lip started yesterday started on left side moved to the entire upper lip. Swelling has since much improved without any treatment she denies difficulty swallowing or dyspnea. Reported mild pain on swallowing no other complaint no other associated treatment. No other associated symptoms Past Medical History  Diagnosis Date  . Hyperlipidemia   . Hypertension   . GERD (gastroesophageal reflux disease)   . Leg pain   . Headache   . Carotid artery occlusion   . COPD (chronic obstructive pulmonary disease)   . CAD (coronary artery disease)   . Peripheral vascular disease     Past Surgical History  Procedure Date  . Carotid endarterectomy 11/21/2007  . Angioplasty / stenting femoral 2010    Left leg by Dr. Eldridge Dace  . Coronary angioplasty with stent placement     Dr. Eldridge Dace    Family History  Problem Relation Age of Onset  . Cancer Mother     BRAIN AND LUNG  . Hypertension Father     History  Substance Use Topics  . Smoking status: Current Everyday Smoker -- 0.5 packs/day for 35 years    Types: Cigarettes  . Smokeless tobacco: Never Used   Comment: has not started wellbutrin yet  . Alcohol Use: No    OB History    Grav Para Term Preterm Abortions TAB SAB Ect Mult Living                  Review of Systems  Constitutional: Negative.   HENT: Negative.        Mild dysphagia  Respiratory: Negative.   Cardiovascular: Negative.   Gastrointestinal: Negative.   Musculoskeletal: Negative.   Skin: Negative.        Swelling of upper lip  Neurological: Negative.   Hematological: Negative.   Psychiatric/Behavioral: Negative.   All other systems reviewed and  are negative.    Allergies  Review of patient's allergies indicates no known allergies.  Home Medications   Current Outpatient Rx  Name Route Sig Dispense Refill  . ASPIRIN 325 MG PO TABS Oral Take 325 mg by mouth daily.      . BUPROPION HCL ER (SR) 150 MG PO TB12 Oral Take 1 tablet (150 mg total) by mouth 2 (two) times daily. 60 tablet 3  . CLOPIDOGREL BISULFATE 75 MG PO TABS Oral Take 1 tablet (75 mg total) by mouth daily. 30 tablet 6  . LISINOPRIL-HYDROCHLOROTHIAZIDE 20-12.5 MG PO TABS Oral Take 1 tablet by mouth daily. 90 tablet 2  . NITROGLYCERIN 0.4 MG SL SUBL Sublingual Place 0.4 mg under the tongue every 5 (five) minutes as needed. If no relief by 3rd tab, call 911     . NORTRIPTYLINE HCL 10 MG PO CAPS Oral Take 1 capsule (10 mg total) by mouth at bedtime. 30 capsule 11  . NORTRIPTYLINE HCL 10 MG PO CAPS Oral Take 1 capsule (10 mg total) by mouth at bedtime. 30 capsule 12  . SIMVASTATIN 40 MG PO TABS      . TRIAMCINOLONE ACETONIDE 0.5 % EX CREA Topical Apply topically 2 (two) times daily. Apply to affected area       BP 129/69  Pulse 89  Temp(Src) 98.2  F (36.8 C) (Oral)  Resp 20  SpO2 100%  Physical Exam  Nursing note and vitals reviewed. Constitutional: She appears well-developed and well-nourished.  HENT:  Head: Normocephalic and atraumatic.       Having secretions well no swelling of tongue lips or the uvula  Eyes: Conjunctivae are normal. Pupils are equal, round, and reactive to light.  Neck: Neck supple. No tracheal deviation present. No thyromegaly present.  Cardiovascular: Normal rate and regular rhythm.   No murmur heard. Pulmonary/Chest: Effort normal and breath sounds normal.  Abdominal: Soft. Bowel sounds are normal. She exhibits no distension. There is no tenderness.  Musculoskeletal: Normal range of motion. She exhibits no edema and no tenderness.  Neurological: She is alert. Coordination normal.  Skin: Skin is warm and dry. No rash noted.    Psychiatric: She has a normal mood and affect.    ED Course  Procedures (including critical care time)  Labs Reviewed - No data to display No results found.   No diagnosis found.    MDM  Strongly suspect angioedema likely culprit is lisinopril Plan stop lisinopril-HCTZ Followup PMD Diagnosis angioedema        Doug Sou, MD 06/10/11 1240

## 2011-06-10 NOTE — Telephone Encounter (Signed)
Patient states yesterday her lip started swelling and then later yesterday her whole face  was swollen.  She went to ED this AM .  She was diagnosed with angioedema and was told to stop Lisinopril/HCTZ  and she needs Dr. Denyse Amass to call in another prescription for her BP .

## 2011-06-10 NOTE — ED Notes (Signed)
Pt reports swelling in her lips and around her jaw area.  Pt reports swelling started yesterday.  Denies diff breathing at this time.  Pt reports dysphagia-denies any pain or starting on new meds.

## 2011-06-14 ENCOUNTER — Encounter: Payer: Self-pay | Admitting: Family Medicine

## 2011-06-14 ENCOUNTER — Ambulatory Visit (INDEPENDENT_AMBULATORY_CARE_PROVIDER_SITE_OTHER): Payer: 59 | Admitting: Family Medicine

## 2011-06-14 VITALS — BP 146/68 | HR 82 | Resp 16 | Ht 74.0 in | Wt 182.0 lb

## 2011-06-14 DIAGNOSIS — I2510 Atherosclerotic heart disease of native coronary artery without angina pectoris: Secondary | ICD-10-CM

## 2011-06-14 DIAGNOSIS — F17200 Nicotine dependence, unspecified, uncomplicated: Secondary | ICD-10-CM

## 2011-06-14 DIAGNOSIS — Z87898 Personal history of other specified conditions: Secondary | ICD-10-CM

## 2011-06-14 DIAGNOSIS — Z23 Encounter for immunization: Secondary | ICD-10-CM

## 2011-06-14 DIAGNOSIS — I10 Essential (primary) hypertension: Secondary | ICD-10-CM

## 2011-06-14 DIAGNOSIS — I70219 Atherosclerosis of native arteries of extremities with intermittent claudication, unspecified extremity: Secondary | ICD-10-CM

## 2011-06-14 DIAGNOSIS — I739 Peripheral vascular disease, unspecified: Secondary | ICD-10-CM

## 2011-06-14 DIAGNOSIS — E785 Hyperlipidemia, unspecified: Secondary | ICD-10-CM

## 2011-06-14 MED ORDER — ATORVASTATIN CALCIUM 20 MG PO TABS: 20.0000 mg | ORAL_TABLET | Freq: Every day | ORAL | Status: DC

## 2011-06-14 MED ORDER — AMLODIPINE BESYLATE 5 MG PO TABS: 5.0000 mg | ORAL_TABLET | Freq: Every day | ORAL | Status: DC

## 2011-06-14 NOTE — Assessment & Plan Note (Signed)
Hypertension on hydrochlorothiazide with continued blood pressure elevation. Plan to start low-dose amlodipine, and followup in 3 months.  Plan to check basic metabolic panel today.

## 2011-06-14 NOTE — Patient Instructions (Signed)
Thank you for coming in today. Please start amlodipine 5mg  a day for blood pressure.  Also stop Simvastatin and start lipitor at night.  We will get some labs today.  See me in June.  I am leaving in June if you cannot get in that is OK the new doctor is good.  When your desire to quit smoking is more than a 6 let us know.  Think about the pros and cons of smoking.

## 2011-06-14 NOTE — Assessment & Plan Note (Signed)
Angioedema do to lisinopril which has since been discontinued. Currently doing well.  Will avoid ACE inhibitors and ARB's in the future

## 2011-06-14 NOTE — Assessment & Plan Note (Signed)
Status post bypass operation of the left extremity. Patient doing well.  Plan for lipid control, antiplatelet agents, blood pressure control, and encouragement of smoking cessation.

## 2011-06-14 NOTE — Assessment & Plan Note (Signed)
Patient not very desirous of tobacco cessation today. Discussed risks and benefits.  Advised continued reduction of tobacco, or switching to electronic cigarettes.  Advised to return to clinic when her desire is more than a 6/10, or in 3 months.

## 2011-06-14 NOTE — Assessment & Plan Note (Signed)
Previously on simvastatin 40 with a direct LDL in the 90s.  However with the addition of amlodipine in the desired LDL of in the 70s will switch to Lipitor. Check direct LDL today

## 2011-06-14 NOTE — Progress Notes (Signed)
Leah [name] is a 61 y.o. female who presents to [facility] today for   1) angioedema: Patient developed angioedema and was seen in the hospital last week.  The angioedema was limited to her lips and not life-threatening. Her lisinopril was discontinued. Additionally at that point I contacted the patient and wrote a prescription for hydrochlorothiazide 25, which she has since been taking.  She feels well denies any further lip swelling, trouble speaking or breathing.   2) peripheral arterial disease: Had left leg bypass surgery since last the last visit.  She feels well and denies any life limiting claudication symptoms.    3) hypertension: Medication changes as above. No chest pain palpitations edema or syncope.   4) smoking: Daily smoker. Smokes about half a pack a day of menthol lights.  Her desire to quit is 4/10. Her self rated ability to quit is 7/10.  She smokes upon awakening every morning.  She gets a lot of pleasure from smoking.    5) hyperlipidemia: Controlled on simvastatin, with a most recent direct LDL of 97.  No history of myalgia, or hepatitis with statins.   PMH reviewed. Significant for peripheral arterial disease and smoking ROS as above otherwise neg Medications reviewed. Current Outpatient Prescriptions  Medication Sig Dispense Refill  . aspirin 325 MG tablet Take 325 mg by mouth daily.        Marland Kitchen atorvastatin (LIPITOR) 20 MG tablet Take 1 tablet (20 mg total) by mouth daily.  90 tablet  3  . clopidogrel (PLAVIX) 75 MG tablet Take 1 tablet (75 mg total) by mouth daily.  30 tablet  6  . hydrochlorothiazide (HYDRODIURIL) 25 MG tablet Take 1 tablet (25 mg total) by mouth daily.  90 tablet  1  . Ibuprofen-Diphenhydramine Cit (ADVIL PM) 200-38 MG TABS Take 1 tablet by mouth at bedtime as needed. For pain and sleep      . nitroGLYCERIN (NITROSTAT) 0.4 MG SL tablet Place 0.4 mg under the tongue every 5 (five) minutes as needed. If no relief by 3rd tab, call 911       .  nortriptyline (PAMELOR) 10 MG capsule Take 1 capsule (10 mg total) by mouth at bedtime.  30 capsule  12  . nortriptyline (PAMELOR) 10 MG capsule Take 10 mg by mouth at bedtime.      Marland Kitchen DISCONTD: nortriptyline (PAMELOR) 10 MG capsule Take 1 capsule (10 mg total) by mouth at bedtime.  30 capsule  11  . amLODipine (NORVASC) 5 MG tablet Take 1 tablet (5 mg total) by mouth daily.  90 tablet  3    Exam:  BP 146/68  Pulse 82  Resp 16  Ht 6\' 2"  (1.88 m)  Wt 182 lb (82.555 kg)  BMI 23.37 kg/m2 Gen: Well NAD HEENT: EOMI,  MMM, no lip or tongue swelling Lungs: CTABL Nl WOB Heart: RRR no MRG Abd: NABS, NT, ND Exts: Non edematous BL  LE, warm and well perfused.

## 2011-06-15 ENCOUNTER — Encounter: Payer: Self-pay | Admitting: Family Medicine

## 2011-06-15 NOTE — Assessment & Plan Note (Signed)
Plan to continue ASA. Change to lipitor and advise stop smoking.

## 2011-08-30 ENCOUNTER — Ambulatory Visit: Payer: 59 | Admitting: Family Medicine

## 2011-09-04 ENCOUNTER — Ambulatory Visit (INDEPENDENT_AMBULATORY_CARE_PROVIDER_SITE_OTHER): Payer: 59 | Admitting: Family Medicine

## 2011-09-04 ENCOUNTER — Encounter: Payer: Self-pay | Admitting: Family Medicine

## 2011-09-04 ENCOUNTER — Ambulatory Visit (HOSPITAL_COMMUNITY)
Admission: RE | Admit: 2011-09-04 | Discharge: 2011-09-04 | Disposition: A | Discharge status: Home / Self Care | Payer: 59 | Source: Ambulatory Visit | Attending: Family Medicine | Admitting: Family Medicine

## 2011-09-04 VITALS — BP 179/82 | HR 90 | Ht 74.5 in | Wt 175.0 lb

## 2011-09-04 DIAGNOSIS — F17200 Nicotine dependence, unspecified, uncomplicated: Secondary | ICD-10-CM | POA: Insufficient documentation

## 2011-09-04 DIAGNOSIS — I2510 Atherosclerotic heart disease of native coronary artery without angina pectoris: Secondary | ICD-10-CM | POA: Insufficient documentation

## 2011-09-04 DIAGNOSIS — R5383 Other fatigue: Secondary | ICD-10-CM | POA: Insufficient documentation

## 2011-09-04 DIAGNOSIS — I10 Essential (primary) hypertension: Secondary | ICD-10-CM | POA: Insufficient documentation

## 2011-09-04 DIAGNOSIS — E785 Hyperlipidemia, unspecified: Secondary | ICD-10-CM

## 2011-09-04 DIAGNOSIS — R079 Chest pain, unspecified: Secondary | ICD-10-CM | POA: Insufficient documentation

## 2011-09-04 DIAGNOSIS — I70219 Atherosclerosis of native arteries of extremities with intermittent claudication, unspecified extremity: Secondary | ICD-10-CM | POA: Insufficient documentation

## 2011-09-04 DIAGNOSIS — R5381 Other malaise: Secondary | ICD-10-CM

## 2011-09-04 MED ORDER — CARVEDILOL 3.125 MG PO TABS: 3.1250 mg | ORAL_TABLET | Freq: Two times a day (BID) | ORAL | Status: DC

## 2011-09-04 MED ORDER — AMLODIPINE BESYLATE 10 MG PO TABS: 10.0000 mg | ORAL_TABLET | Freq: Every day | ORAL | Status: DC

## 2011-09-04 NOTE — Assessment & Plan Note (Signed)
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  However will do simple workup with TSH and CBC and followup in a few weeks. May consider trazodone after work when it is time to sleep

## 2011-09-04 NOTE — Assessment & Plan Note (Signed)
Patient has a history of coronary artery disease with stenting.  She is currently on aspirin and Plavix as well as statin and calcium channel blocker.  She's began experiencing intermittent atypical chest pain symptoms over the last month. Her EKG is not concerning for acute ischemia today.   I do feel that she would benefit from further risk factor stratification with stress test or coronary catheterization.  Plan to refer to cardiology for further evaluation. Discussed warning signs or symptoms. Please see discharge instructions. Patient expresses understanding.

## 2011-09-04 NOTE — Patient Instructions (Addendum)
Thank you for coming in today. 1) Blood pressure: We will increase amlodipine to 10mg  daily. We will also all Carvidiol 3.125 twice a day.  Please come back in 2 weeks for a recheck.  2) Chest Pain: We will send you to the heart doctor soon. Please check your phone for messages.  Call or go to the emergency room if you get worse, have trouble breathing, have chest pains, or palpitations.  3) Fatigue: We are checking anemia and low thyorid. If those are OK we will give some medicines to help you sleep and see if that is ok.

## 2011-09-04 NOTE — Progress Notes (Signed)
Leah [name] is a 61 y.o. female who presents to [facility] today for   1) chest pain: Patient has had intermittent substernal nonradiating sharp chest pain over the last month. She denies any pain with exertion or palpitations edema or shortness of breath.    2) hyperlipidemia: Taking medications as listed below.  3) hypertension: Is taking amlodipine 5 and hydrochlorothiazide 25. Has a history of angioedema with lisinopril. Symptoms as noted above.  4) fatigue: Patient is experience significant fatigue recently. This is occurred over the past several months. She attributes this to difficulty sleeping. 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 She denies any snoring hot or cold intolerance.   PMH: Reviewed significant for peripheral vascular disease, coronary artery disease and smoking History  Substance Use Topics  . Smoking status: Current Everyday Smoker -- 0.5 packs/day for 35 years    Types: Cigarettes  . Smokeless tobacco: Never Used   Comment: has not started wellbutrin yet  . Alcohol Use: No   ROS as above  Medications reviewed. Current Outpatient Prescriptions  Medication Sig Dispense Refill  . amLODipine (NORVASC) 10 MG tablet Take 1 tablet (10 mg total) by mouth daily.  90 tablet  3  . aspirin 325 MG tablet Take 325 mg by mouth daily.        Marland Kitchen atorvastatin (LIPITOR) 20 MG tablet Take 1 tablet (20 mg total) by mouth daily.  90 tablet  3  . clopidogrel (PLAVIX) 75 MG tablet Take 1 tablet (75 mg total) by mouth daily.  30 tablet  6  . hydrochlorothiazide (HYDRODIURIL) 25 MG tablet Take 1 tablet (25 mg total) by mouth daily.  90 tablet  1  . Ibuprofen-Diphenhydramine Cit (ADVIL PM) 200-38 MG TABS Take 1 tablet by mouth at bedtime as needed. For pain and sleep      . nortriptyline (PAMELOR) 10 MG capsule Take 1 capsule (10 mg total) by mouth at bedtime.  30 capsule  12  . DISCONTD: amLODipine (NORVASC) 5 MG tablet Take 1 tablet (5 mg total)  by mouth daily.  90 tablet  3  . carvedilol (COREG) 3.125 MG tablet Take 1 tablet (3.125 mg total) by mouth 2 (two) times daily with a meal.  60 tablet  3  . nitroGLYCERIN (NITROSTAT) 0.4 MG SL tablet Place 0.4 mg under the tongue every 5 (five) minutes as needed. If no relief by 3rd tab, call 911       . DISCONTD: buPROPion (WELLBUTRIN SR) 150 MG 12 hr tablet Take 1 tablet (150 mg total) by mouth 2 (two) times daily.  60 tablet  3  . DISCONTD: nortriptyline (PAMELOR) 10 MG capsule Take 10 mg by mouth at bedtime.      Marland Kitchen DISCONTD: simvastatin (ZOCOR) 40 MG tablet Take 40 mg by mouth at bedtime.         Exam:  BP 179/82  Pulse 90  Ht 6' 2.5" (1.892 m)  Wt 175 lb (79.379 kg)  BMI 22.17 kg/m2 Gen: Well NAD HEENT: EOMI,  MMM Lungs: CTABL Nl WOB Heart: RRR soft nonradiating systolic murmur at the right upper sternal border Abd: NABS, NT, ND Exts: Non edematous BL  LE  No results found for this or any previous visit (from the past 72 hour(s)).   EKG shows normal sinus rhythm at 77 beats per minute. Normal QRS with nonspecific ST changes

## 2011-09-04 NOTE — Assessment & Plan Note (Signed)
Pressure not well-controlled today with amlodipine and hydrochlorothiazide. Plan to increase amlodipine to 10 mg and start low-dose carbon dioxide. Would like to put am unable to use ACE inhibitor or angiotensin receptor blocker secondary to angioedema.  Check a comprehensive metabolic panel today and followup in a few weeks

## 2011-09-09 ENCOUNTER — Telehealth: Payer: Self-pay | Admitting: Family Medicine

## 2011-09-09 DIAGNOSIS — I739 Peripheral vascular disease, unspecified: Secondary | ICD-10-CM

## 2011-09-09 LAB — BASIC METABOLIC PANEL
Glucose: 94
Potassium: 3.2 mmol/L

## 2011-09-09 LAB — LDL CHOLESTEROL, DIRECT: Direct LDL: 185 — AB

## 2011-09-09 LAB — CBC: Hemoglobin: 12.7 g/dL (ref 12.0–16.0)

## 2011-09-09 LAB — TSH: TSH: 0.453

## 2011-09-09 MED ORDER — ATORVASTATIN CALCIUM 80 MG PO TABS: 80.0000 mg | ORAL_TABLET | Freq: Every day | ORAL | Status: DC

## 2011-09-09 NOTE — Telephone Encounter (Signed)
Left a message about increased LDL (185). Increased lipitor to 80mg  qhs.

## 2011-09-16 ENCOUNTER — Ambulatory Visit: Payer: 59 | Admitting: Family Medicine

## 2011-09-18 ENCOUNTER — Encounter: Payer: Self-pay | Admitting: Neurosurgery

## 2011-09-19 ENCOUNTER — Ambulatory Visit: Payer: 59 | Admitting: Neurosurgery

## 2011-09-20 ENCOUNTER — Ambulatory Visit: Payer: 59 | Admitting: Neurosurgery

## 2011-10-02 ENCOUNTER — Ambulatory Visit (INDEPENDENT_AMBULATORY_CARE_PROVIDER_SITE_OTHER): Payer: 59 | Admitting: Cardiology

## 2011-10-02 ENCOUNTER — Encounter: Payer: Self-pay | Admitting: Cardiology

## 2011-10-02 VITALS — BP 136/62 | HR 82 | Ht 73.0 in | Wt 173.0 lb

## 2011-10-02 DIAGNOSIS — R002 Palpitations: Secondary | ICD-10-CM

## 2011-10-02 DIAGNOSIS — I739 Peripheral vascular disease, unspecified: Secondary | ICD-10-CM

## 2011-10-02 DIAGNOSIS — I10 Essential (primary) hypertension: Secondary | ICD-10-CM

## 2011-10-02 DIAGNOSIS — R079 Chest pain, unspecified: Secondary | ICD-10-CM

## 2011-10-02 DIAGNOSIS — I493 Ventricular premature depolarization: Secondary | ICD-10-CM | POA: Insufficient documentation

## 2011-10-02 DIAGNOSIS — F17200 Nicotine dependence, unspecified, uncomplicated: Secondary | ICD-10-CM

## 2011-10-02 DIAGNOSIS — I2510 Atherosclerotic heart disease of native coronary artery without angina pectoris: Secondary | ICD-10-CM

## 2011-10-02 NOTE — Assessment & Plan Note (Signed)
Blood pressure appears to be well controlled today. If her blood pressure were difficult to control we may want to consider reevaluation of her renal stents. For the time being we will continue with her antihypertensive therapy.

## 2011-10-02 NOTE — Assessment & Plan Note (Signed)
She has daily palpitations. We will have her wear a 24-hour Holter monitor to document any  potential arrhythmia. again of recommend smoking cessation. She is already on carvedilol which we could increase if need be.

## 2011-10-02 NOTE — Assessment & Plan Note (Signed)
She has symptoms of chest pain that are somewhat atypical for angina. Given her known history of coronary disease with prior stenting of the LAD in 2009 we will schedule her for a stress Myoview study. If normal we will recommend continued risk factor modification. If abnormal she will need cardiac catheterization.

## 2011-10-02 NOTE — Progress Notes (Signed)
Audreanna Torrisi Ceballos Date of Birth: June 28, 1950 Medical Record #[id number]  History of Present Illness: Mrs. Kuznicki is seen today for evaluation of chest pain and palpitations. She is referred by Dr. Denyse Amass. She has an extensive cardiovascular history. She is status post stenting of the mid LAD in June of 2009 with a 2.5 x 12 mm Promus stent. She had no other significant coronary disease at that time. She reports that she had a stress test 2 years ago that looked okay. She is also status post left carotid endarterectomy in August of 2009. She is status post right external iliac stenting and bilateral renal artery stenting in 2009. In October of 2012 she underwent left femoropopliteal bypass grafting by Dr. Hart Rochester. She has had followup lower extremity Dopplers and carotid Dopplers with Dr. Hart Rochester. She continues to smoke. Her major complaint today is of intermittent pressure in her chest. She also has a sensation like needles are sticking in her heart. The symptoms are nonexertional. She is not taking any nitroglycerin. She also notes that when she lies down her heart races and flutters. Typically this will last 5 minutes and then resolved. This apparently occurs every time she lies down.  Current Outpatient Prescriptions on File Prior to Visit  Medication Sig Dispense Refill  . amLODipine (NORVASC) 10 MG tablet Take 1 tablet (10 mg total) by mouth daily.  90 tablet  3  . aspirin 325 MG tablet Take 325 mg by mouth daily.        Marland Kitchen atorvastatin (LIPITOR) 80 MG tablet Take 1 tablet (80 mg total) by mouth daily.  90 tablet  3  . carvedilol (COREG) 3.125 MG tablet Take 1 tablet (3.125 mg total) by mouth 2 (two) times daily with a meal.  60 tablet  3  . clopidogrel (PLAVIX) 75 MG tablet Take 1 tablet (75 mg total) by mouth daily.  30 tablet  6  . hydrochlorothiazide (HYDRODIURIL) 25 MG tablet Take 1 tablet (25 mg total) by mouth daily.  90 tablet  1  . DISCONTD: buPROPion (WELLBUTRIN SR) 150 MG 12 hr  tablet Take 1 tablet (150 mg total) by mouth 2 (two) times daily.  60 tablet  3  . DISCONTD: simvastatin (ZOCOR) 40 MG tablet Take 40 mg by mouth at bedtime.         Allergies  Allergen Reactions  . Lisinopril Swelling    Angioedema 06/10/11  . Chantix (Varenicline)     Past Medical History  Diagnosis Date  . Hyperlipidemia   . Hypertension   . GERD (gastroesophageal reflux disease)   . Leg pain   . Headache   . Carotid artery occlusion   . COPD (chronic obstructive pulmonary disease)   . CAD (coronary artery disease)   . Peripheral vascular disease   . Tobacco abuse     Past Surgical History  Procedure Date  . Carotid endarterectomy 11/21/2007    left  . Angioplasty / stenting iliac 2010    right external iliac by Dr. Eldridge Dace  . Coronary angioplasty with stent placement 6/09    LAD 2.5x12 Promus  . Femoral-popliteal bypass graft 10/12    left Dr. Hart Rochester    History  Smoking status  . Current Everyday Smoker -- 0.5 packs/day for 35 years  . Types: Cigarettes  Smokeless tobacco  . Never Used  Comment: has not started wellbutrin yet    History  Alcohol Use No    Family History  Problem Relation Age of Onset  .  Cancer Mother     BRAIN AND LUNG  . Hypertension Father   . Heart disease Father     Review of Systems: The review of systems is positive for continued tobacco abuse. She is sedentary. She denies any significant claudication symptoms. She's had no recent TIA or CVA symptoms.All other systems were reviewed and are negative.  Physical Exam: BP 136/62  Pulse 82  Ht 6\' 1"  (1.854 m)  Wt 173 lb (78.472 kg)  BMI 22.82 kg/m2  SpO2 92% She is a thin black female in no acute distress. She is normocephalic, atraumatic. Pupils are equal round and reactive to light accommodation. Extraocular movements are full. Sclera are clear. Oropharynx is clear. Neck is supple without JVD, adenopathy, thyromegaly, or bruits. She has an old left CEA scar. Lungs are clear.  Cardiac exam reveals a regular rate and rhythm without gallop, murmur, or click. Abdomen is soft and nontender without masses or bruits. She has bilateral femoral bruits with palpable pulses. Her pedal pulses are trace on the left and 1+ on the right. Skin is warm and dry. She is alert and oriented x3. Cranial nerves II through XII are intact. She has no focal motor or sensory deficits. Mood is appropriate. LABORATORY DATA:  ECG dated 09/04/2011 shows normal sinus rhythm with minor nonspecific ST abnormality. Recent chemistry panel was normal except for glucose of 105. Hemoglobin was 8.9. TSH 0.45. LDL cholesterol was 185. Assessment / Plan:

## 2011-10-02 NOTE — Patient Instructions (Signed)
We will schedule you for a nuclear stress test.  We will have you wear a monitor for 24 hours.  You need to quit smoking.  Continue your current medication

## 2011-10-02 NOTE — Assessment & Plan Note (Signed)
Patient is intolerant to Chantix. She expresses desire to quit. I have stressed the importance of smoking cessation given her extensive cardiovascular disease. She states her whole family is going to try and quit.

## 2011-10-10 ENCOUNTER — Encounter (HOSPITAL_COMMUNITY): Payer: 59

## 2011-10-18 ENCOUNTER — Telehealth: Payer: Self-pay

## 2011-10-18 NOTE — Telephone Encounter (Signed)
Patient called no answer.Left message to call back, Dr.Jordan advised needs to schedule myoview.Monitor revealed occasional PVC's with rare PVC couplets.

## 2011-10-21 ENCOUNTER — Encounter: Payer: Self-pay | Admitting: Family Medicine

## 2011-10-21 ENCOUNTER — Ambulatory Visit (INDEPENDENT_AMBULATORY_CARE_PROVIDER_SITE_OTHER): Payer: 59 | Admitting: Family Medicine

## 2011-10-21 VITALS — BP 130/77 | HR 57 | Temp 98.0°F | Ht 73.0 in | Wt 173.0 lb

## 2011-10-21 DIAGNOSIS — R300 Dysuria: Secondary | ICD-10-CM

## 2011-10-21 DIAGNOSIS — N390 Urinary tract infection, site not specified: Secondary | ICD-10-CM | POA: Insufficient documentation

## 2011-10-21 DIAGNOSIS — R109 Unspecified abdominal pain: Secondary | ICD-10-CM

## 2011-10-21 LAB — POCT URINALYSIS DIPSTICK
Bilirubin, UA: NEGATIVE
Glucose, UA: NEGATIVE
Nitrite, UA: NEGATIVE

## 2011-10-21 LAB — POCT UA - MICROSCOPIC ONLY

## 2011-10-21 MED ORDER — CEPHALEXIN 500 MG PO CAPS: 500.0000 mg | ORAL_CAPSULE | Freq: Two times a day (BID) | ORAL | Status: AC

## 2011-10-21 MED ORDER — CEPHALEXIN 500 MG PO CAPS: 500.0000 mg | ORAL_CAPSULE | Freq: Four times a day (QID) | ORAL | Status: DC

## 2011-10-21 NOTE — Progress Notes (Signed)
  Subjective:    Patient ID: Leah [name], female    DOB: 11-25-1950, 61 y.o.   MRN: [id number]  HPI work in appt  2 weeks of back pain, worse with twisting. No radiation, numbness, weakness. No injury or overuse.  Several days of burning with urination.  Urinary frequency.  No hesitancy.    No fever, chills, nausea.  I have reviewed patient's  PMH, FH, and Social history and Medications as related to this visit. Has history of stent in kidneys for ? Blockage, history of kidney stones.  No recent antibiotics.  No immunocompromise.  Review of Systems See HPI    Objective:   Physical Exam  GEN: Alert & Oriented, No acute distress CV:  Regular Rate & Rhythm, no murmur Respiratory:  Normal work of breathing, CTAB Abd:  + BS, soft, no tenderness to palpation.  No flank pain         Assessment & Plan:

## 2011-10-21 NOTE — Patient Instructions (Addendum)

## 2011-10-21 NOTE — Assessment & Plan Note (Signed)
UTI suggested by symptoms  And U/A.  Keflex bid x 7 days.  Gave edu handout, discussed red flags for return.

## 2011-10-22 ENCOUNTER — Telehealth: Payer: Self-pay | Admitting: Cardiology

## 2011-10-22 NOTE — Telephone Encounter (Signed)
New msg Pt called about myoview test. Please call

## 2011-10-23 NOTE — Telephone Encounter (Signed)
Patient was called she stated she already scheduled myoview 10/28/11.

## 2011-10-24 ENCOUNTER — Encounter: Payer: Self-pay | Admitting: Neurosurgery

## 2011-10-25 ENCOUNTER — Encounter (INDEPENDENT_AMBULATORY_CARE_PROVIDER_SITE_OTHER): Payer: 59 | Admitting: *Deleted

## 2011-10-25 ENCOUNTER — Encounter: Payer: Self-pay | Admitting: Neurosurgery

## 2011-10-25 ENCOUNTER — Ambulatory Visit (INDEPENDENT_AMBULATORY_CARE_PROVIDER_SITE_OTHER): Payer: 59 | Admitting: Neurosurgery

## 2011-10-25 VITALS — BP 117/73 | HR 56 | Resp 16 | Ht 74.0 in | Wt 172.1 lb

## 2011-10-25 DIAGNOSIS — Z48812 Encounter for surgical aftercare following surgery on the circulatory system: Secondary | ICD-10-CM

## 2011-10-25 DIAGNOSIS — I739 Peripheral vascular disease, unspecified: Secondary | ICD-10-CM

## 2011-10-25 NOTE — Progress Notes (Signed)
[facility] Office Note  CC: Six-month lower extremity graft duplex and ABIs Referring Physician: Hart Rochester  History of Present Illness: [name] who is status post a left femoropopliteal bypass graft in October 2012. The Patient reports no claudication pain with ambulation, she denies rest pain or open ulcerations or lower extremities. The patient reports excellent relief of any claudication symptoms. The patient denies any new medical diagnoses or recent surgeries.  Past Medical History  Diagnosis Date  . Hyperlipidemia   . Hypertension   . GERD (gastroesophageal reflux disease)   . Leg pain   . Headache   . Carotid artery occlusion   . COPD (chronic obstructive pulmonary disease)   . CAD (coronary artery disease)   . Peripheral vascular disease   . Tobacco abuse     ROS: [x]  Positive   [ ]  Denies    General: [ ]  Weight loss, [ ]  Fever, [ ]  chills Neurologic: [ ]  Dizziness, [ ]  Blackouts, [ ]  Seizure [ ]  Stroke, [ ]  "Mini stroke", [ ]  Slurred speech, [ ]  Temporary blindness; [ ]  weakness in arms or legs, [ ]  Hoarseness Cardiac: [ ]  Chest pain/pressure, [ ]  Shortness of breath at rest [ ]  Shortness of breath with exertion, [ ]  Atrial fibrillation or irregular heartbeat Vascular: [ ]  Pain in legs with walking, [ ]  Pain in legs at rest, [ ]  Pain in legs at night,  [ ]  Non-healing ulcer, [ ]  Blood clot in vein/DVT,   Pulmonary: [ ]  Home oxygen, [ ]  Productive cough, [ ]  Coughing up blood, [ ]  Asthma,  [ ]  Wheezing Musculoskeletal:  [ ]  Arthritis, [ ]  Low back pain, [ ]  Joint pain Hematologic: [ ]  Easy Bruising, [ ]  Anemia; [ ]  Hepatitis Gastrointestinal: [ ]  Blood in stool, [ ]  Gastroesophageal Reflux/heartburn, [ ]  Trouble swallowing Urinary: [ ]  chronic Kidney disease, [ ]  on HD - [ ]  MWF or [ ]  TTHS, [ ]  Burning with urination, [ ]  Difficulty urinating Skin: [ ]  Rashes, [ ]  Wounds Psychological: [ ]  Anxiety, [ ]   Depression   Social History History  Substance Use Topics  . Smoking status: Current Everyday Smoker -- 0.5 packs/day for 35 years    Types: Cigarettes  . Smokeless tobacco: Never Used   Comment: has not started wellbutrin yet  . Alcohol Use: No    Family History Family History  Problem Relation Age of Onset  . Cancer Mother     BRAIN AND LUNG  . Hypertension Father   . Heart disease Father     Allergies  Allergen Reactions  . Lisinopril Swelling    Angioedema 06/10/11  . Chantix (Varenicline)     insomnia    Current Outpatient Prescriptions  Medication Sig Dispense Refill  . amLODipine (NORVASC) 10 MG tablet Take 1 tablet (10 mg total) by mouth daily.  90 tablet  3  . aspirin 325 MG tablet Take 325 mg by mouth daily.        Marland Kitchen atorvastatin (LIPITOR) 80 MG tablet Take 1 tablet (80 mg total) by mouth daily.  90 tablet  3  . carvedilol (COREG) 3.125 MG tablet Take 1 tablet (3.125 mg total) by mouth 2 (two) times daily with a meal.  60 tablet  3  . cephALEXin (KEFLEX) 500 MG capsule Take 1 capsule (500 mg total) by mouth 2 (two) times daily.  14 capsule  0  . clopidogrel (PLAVIX) 75  MG tablet Take 1 tablet (75 mg total) by mouth daily.  30 tablet  6  . COREG 3.125 MG tablet Take by mouth daily.      Marland Kitchen amLODipine (NORVASC) 10 MG tablet Take by mouth daily.      . Cephalexin 500 MG tablet Take by mouth Twice daily.      . hydrochlorothiazide (HYDRODIURIL) 25 MG tablet Take 1 tablet (25 mg total) by mouth daily.  90 tablet  1  . DISCONTD: buPROPion (WELLBUTRIN SR) 150 MG 12 hr tablet Take 1 tablet (150 mg total) by mouth 2 (two) times daily.  60 tablet  3  . DISCONTD: simvastatin (ZOCOR) 40 MG tablet Take 40 mg by mouth at bedtime.         Physical Examination  Filed Vitals:   10/25/11 1459  BP: 117/73  Pulse: 56  Resp: 16    Body mass index is 22.10 kg/(m^2).  General:  WDWN in NAD Gait: Normal HEENT: WNL Eyes: Pupils equal Pulmonary: normal non-labored breathing  , without Rales, rhonchi,  wheezing Cardiac: RRR, without  Murmurs, rubs or gallops; No carotid bruits Abdomen: soft, NT, no masses Skin: no rashes, ulcers noted Vascular Exam/Pulses: Palpable femoral pulses bilaterally PT and DP pulses are palpable  Extremities without ischemic changes, no Gangrene , no cellulitis; no open wounds;  Musculoskeletal: no muscle wasting or atrophy  Neurologic: A&O X 3; Appropriate Affect ; SENSATION: normal; MOTOR FUNCTION:  moving all extremities equally. Speech is fluent/normal  Non-Invasive Vascular Imaging: Widely patent left femoral bypass graft with no evidence of restenosis or hyperplasia. There is significant elevation in velocities at the left distal EIA negative inflow which Dr. Hart Rochester will review. ABIs today are 1.07 on the right 0.76 on the left and triphasic.  ASSESSMENT/PLAN: Patient with excellent relief of claudication status post left femoropopliteal bypass graft October 2012. The patient will return in 6 months for repeat graft duplex and ABIs. The patient's questions are encouraged and answered, she is in agreement with this plan.  Lauree Chandler ANP  Clinic M.D.: Imogene Burn

## 2011-10-28 ENCOUNTER — Ambulatory Visit (HOSPITAL_COMMUNITY): Payer: 59 | Attending: Cardiology | Admitting: Radiology

## 2011-10-28 VITALS — BP 168/77 | HR 68 | Ht 73.0 in | Wt 172.0 lb

## 2011-10-28 DIAGNOSIS — R42 Dizziness and giddiness: Secondary | ICD-10-CM | POA: Insufficient documentation

## 2011-10-28 DIAGNOSIS — I2510 Atherosclerotic heart disease of native coronary artery without angina pectoris: Secondary | ICD-10-CM

## 2011-10-28 DIAGNOSIS — R0989 Other specified symptoms and signs involving the circulatory and respiratory systems: Secondary | ICD-10-CM | POA: Insufficient documentation

## 2011-10-28 DIAGNOSIS — R0602 Shortness of breath: Secondary | ICD-10-CM

## 2011-10-28 DIAGNOSIS — I779 Disorder of arteries and arterioles, unspecified: Secondary | ICD-10-CM | POA: Insufficient documentation

## 2011-10-28 DIAGNOSIS — R0789 Other chest pain: Secondary | ICD-10-CM | POA: Insufficient documentation

## 2011-10-28 DIAGNOSIS — R9439 Abnormal result of other cardiovascular function study: Secondary | ICD-10-CM | POA: Insufficient documentation

## 2011-10-28 DIAGNOSIS — R0609 Other forms of dyspnea: Secondary | ICD-10-CM | POA: Insufficient documentation

## 2011-10-28 DIAGNOSIS — E785 Hyperlipidemia, unspecified: Secondary | ICD-10-CM | POA: Insufficient documentation

## 2011-10-28 DIAGNOSIS — F17200 Nicotine dependence, unspecified, uncomplicated: Secondary | ICD-10-CM | POA: Insufficient documentation

## 2011-10-28 DIAGNOSIS — R000 Tachycardia, unspecified: Secondary | ICD-10-CM | POA: Insufficient documentation

## 2011-10-28 DIAGNOSIS — R5383 Other fatigue: Secondary | ICD-10-CM | POA: Insufficient documentation

## 2011-10-28 DIAGNOSIS — R002 Palpitations: Secondary | ICD-10-CM

## 2011-10-28 DIAGNOSIS — R5381 Other malaise: Secondary | ICD-10-CM | POA: Insufficient documentation

## 2011-10-28 DIAGNOSIS — I10 Essential (primary) hypertension: Secondary | ICD-10-CM | POA: Insufficient documentation

## 2011-10-28 DIAGNOSIS — I739 Peripheral vascular disease, unspecified: Secondary | ICD-10-CM

## 2011-10-28 DIAGNOSIS — R079 Chest pain, unspecified: Secondary | ICD-10-CM

## 2011-10-28 DIAGNOSIS — Z8249 Family history of ischemic heart disease and other diseases of the circulatory system: Secondary | ICD-10-CM | POA: Insufficient documentation

## 2011-10-28 MED ORDER — TECHNETIUM TC 99M TETROFOSMIN IV KIT
30.0000 | PACK | Freq: Once | INTRAVENOUS | Status: AC | PRN
  Administered 2011-10-28: 30 via INTRAVENOUS

## 2011-10-28 MED ORDER — TECHNETIUM TC 99M TETROFOSMIN IV KIT
10.0000 | PACK | Freq: Once | INTRAVENOUS | Status: AC | PRN
  Administered 2011-10-28: 10 via INTRAVENOUS

## 2011-10-28 NOTE — Progress Notes (Signed)
[facility] 3 NUCLEAR MED [address] [place] [address] [phone]  Cardiology Nuclear Med Study  Leah [name] is a 61 y.o. female     MRN : [id number]     DOB: 04/15/1950  Procedure Date: 10/28/2011  Nuclear Med Background Indication for Stress Test:  Evaluation for Ischemia, Stent Patency and Abnormal EKG History:  '09 MPS:No ischemia, EF=42%; '09 Stent-LAD, EF=55%. Cardiac Risk Factors: Carotid Disease, Family History - CAD, Hypertension, Lipids, PVD and Smoker  Symptoms:  Chest Pain, Chest Pressure.  (last date of chest discomfort was last night), Dizziness, DOE, Fatigue, Palpitations and Rapid HR   Nuclear Pre-Procedure Caffeine/Decaff Intake:  None NPO After: 9:30pm   Lungs:  Clear. IV 0.9% NS with Angio Cath:  20g  IV Site: R Antecubital  IV Started by:  Stanton Kidney, EMT-P  Chest Size (in):  42 Cup Size: DD  Height: 6\' 1"  (1.854 m)  Weight:  172 lb (78.019 kg)  BMI:  Body mass index is 22.69 kg/(m^2). Tech Comments:  Coreg held > 36 hours, per patient.    Nuclear Med Study 1 or 2 day study: 1 day  Stress Test Type:  Stress  Reading MD: Willa Rough, MD  Order Authorizing Provider:  Peter Swaziland, MD  Resting Radionuclide: Technetium 77m Tetrofosmin  Resting Radionuclide Dose: 10.8 mCi   Stress Radionuclide:  Technetium 53m Tetrofosmin  Stress Radionuclide Dose: 32.8 mCi           Stress Protocol Rest HR: 68 Stress HR: 157  Rest BP: Sitting 168/77  Standing 135/85 Stress BP: 205/106  Exercise Time (min): 4:30 METS: 6.4   Predicted Max HR: 162 bpm % Max HR: 96.91 bpm Rate Pressure Product: 21308   Dose of Adenosine (mg):  n/a Dose of Lexiscan: n/a mg  Dose of Atropine (mg): n/a Dose of Dobutamine: n/a mcg/kg/min (at max HR)  Stress Test Technologist: Smiley Houseman, CMA-N  Nuclear Technologist:  Doyne Keel, CNMT     Rest Procedure:  Myocardial perfusion imaging was performed at rest 45 minutes following the intravenous  administration of Technetium 43m Tetrofosmin.  Rest ECG: Nonspecific ST-T wave changes with occasional PVC's.  Stress Procedure:  The patient exercised on the treadmill utilizing the Bruce Protocol for 4:30 minutes. She then stopped due to bilateral thigh pain and shortness of breath.  She denied any chest pain.  There were ST-T wave changes, greater in recovery.  She also had a hypertensive response to exercise, 205/106.  Technetium 65m Tetrofosmin was injected at peak exercise and myocardial perfusion imaging was performed after a brief delay.  EKG's and images were discussed with Dr. Patty Sermons, DOD, he felt it was safe for the patient to leave and follow up with Dr. Swaziland with a possible catherization.  Stress ECG: There were nonspecific ST changes. See discussion below.  QPS Raw Data Images:  Patient motion noted; appropriate software correction applied. Stress Images:  There is a small to moderate area of decreased activity affecting the apical cap, apical inferior segment, and apical septal segment. The degree of photon reduction is moderate. Rest Images:  There is a small to moderate area of decreased activity affecting the apical cap, apical inferior segment, And apical septal segment. The degree of photon reduction is mild to moderate. Subtraction (SDS):  There is slight reversibility suggesting slight ischemia. Transient Ischemic Dilatation (Normal <1.22):  0.95 Lung/Heart Ratio (Normal <0.45):  0.42  Quantitative Gated Spect Images QGS EDV:  79 ml QGS  ESV:  26 ml  Impression Exercise Capacity:  Exercise capacity is limited. See discussion below BP Response:  Hypertensive blood pressure response. Clinical Symptoms:  The patient had significant bilateral claudication. ECG Impression:  There were nonspecific ST changes. See discussion below. Comparison with Prior Nuclear Study: No images to compare  Overall Impression:  The resting EKG reveals ST flattening. The patient  exercised for 4-1/2 minutes. There was very early rise with significant stress-induced hypertension. The patient did not have chest pain. There was bilateral thigh pain and shortness of breath that limited any further exercise. The EKGs in recovery showed slight worsening of the diffuse ST flattening. The changes are not diagnostic. The nuclear images reveal a defect in the apical cap and the apical segments of the inferior wall and the inferior septum. There may be slight reversibility. There may be mild scar and mild ischemia. This study is abnormal. It is low to moderate risk.  LV Ejection Fraction: 68%.  LV Wall Motion:  Normal Wall Motion.  Willa Rough, MD

## 2011-10-31 ENCOUNTER — Other Ambulatory Visit: Payer: Self-pay

## 2011-10-31 DIAGNOSIS — I2510 Atherosclerotic heart disease of native coronary artery without angina pectoris: Secondary | ICD-10-CM

## 2011-11-04 NOTE — Procedures (Unsigned)
BYPASS GRAFT EVALUATION  INDICATION:  Followup left fem-pop graft.  HISTORY: Diabetes:  No Cardiac:  Yes Hypertension:  Yes Smoking:  Yes Previous Surgery:  Left fem-pop graft 01/16/2011  SINGLE LEVEL ARTERIAL EXAM                              RIGHT              LEFT Brachial: Anterior tibial: Posterior tibial: Peroneal: Ankle/brachial index:        1.09               0.88  PREVIOUS ABI:  Date: 05/23/2011  RIGHT:  1.07  LEFT:  0.76  LOWER EXTREMITY BYPASS GRAFT DUPLEX EXAM:  DUPLEX: 1. Widely patent left fem-below-knee-popliteal graft, without evidence     of restenosis or hyperplasia. 2. Significantly elevated velocities of the left distal external iliac     native inflow artery, suggestive of 50 to 99% stenosis. 3. Diffuse disease is observed in the left common femoral artery.  IMPRESSION: 1. Widely patent left fem-pop graft, without evidence of restenosis or     hyperplasia. 2. Significant inflow disease observed, as noted above.  ___________________________________________ Quita Skye. Hart Rochester, M.D.  LT/MEDQ  D:  10/25/2011  T:  10/25/2011  Job:  [id number]

## 2011-11-06 ENCOUNTER — Other Ambulatory Visit (INDEPENDENT_AMBULATORY_CARE_PROVIDER_SITE_OTHER): Payer: 59

## 2011-11-06 ENCOUNTER — Telehealth: Payer: Self-pay

## 2011-11-06 DIAGNOSIS — I10 Essential (primary) hypertension: Secondary | ICD-10-CM

## 2011-11-06 DIAGNOSIS — I2510 Atherosclerotic heart disease of native coronary artery without angina pectoris: Secondary | ICD-10-CM

## 2011-11-06 LAB — CBC WITH DIFFERENTIAL/PLATELET
Basophils Relative: 0.5 % (ref 0.0–3.0)
Eosinophils Relative: 2.3 % (ref 0.0–5.0)
HCT: 36.9 % (ref 36.0–46.0)
Lymphs Abs: 3.4 10*3/uL (ref 0.7–4.0)
MCV: 78.9 fl (ref 78.0–100.0)
Monocytes Absolute: 0.7 10*3/uL (ref 0.1–1.0)
Platelets: 179 10*3/uL (ref 150.0–400.0)
WBC: 10.8 10*3/uL — ABNORMAL HIGH (ref 4.5–10.5)

## 2011-11-06 LAB — BASIC METABOLIC PANEL
BUN: 18 mg/dL (ref 6–23)
Calcium: 9.6 mg/dL (ref 8.4–10.5)
GFR: 66.82 mL/min (ref >60.00)
Glucose, Bld: 90 mg/dL (ref 70–99)
Sodium: 137 mEq/L (ref 135–145)

## 2011-11-06 MED ORDER — POTASSIUM CHLORIDE CRYS ER 20 MEQ PO TBCR: 20.0000 meq | EXTENDED_RELEASE_TABLET | Freq: Two times a day (BID) | ORAL | Status: DC

## 2011-11-06 NOTE — Telephone Encounter (Signed)
Patient called was told bmet today 11/06/11 revealed potassium 2.1.Spoke to DOD Dr.Taylor he advised to hold hctz and start kdur 40 meq daily.Repeat potassium Monday 11/11/11.

## 2011-11-11 ENCOUNTER — Other Ambulatory Visit (INDEPENDENT_AMBULATORY_CARE_PROVIDER_SITE_OTHER): Payer: 59

## 2011-11-11 ENCOUNTER — Telehealth: Payer: Self-pay | Admitting: *Deleted

## 2011-11-11 DIAGNOSIS — I10 Essential (primary) hypertension: Secondary | ICD-10-CM

## 2011-11-11 LAB — BASIC METABOLIC PANEL
Calcium: 9.4 mg/dL (ref 8.4–10.5)
Creatinine, Ser: 0.8 mg/dL (ref 0.4–1.2)

## 2011-11-11 NOTE — Telephone Encounter (Signed)
Received stat call from [facility]-pt has K+ of 2.6--up from 2.1 on 8/14--reviewed with l gerhardt pa--per ms gerhardt have pt take KCL today and tomorrow 8/19 and 8/20 and notify cath lab that pt will need stat K+ BEFORE PROCEDURE--[facility] CATH LAB NOTIFIED--NT

## 2011-11-12 ENCOUNTER — Other Ambulatory Visit: Payer: Self-pay | Admitting: Cardiology

## 2011-11-13 ENCOUNTER — Inpatient Hospital Stay (HOSPITAL_BASED_OUTPATIENT_CLINIC_OR_DEPARTMENT_OTHER)
Admission: RE | Admit: 2011-11-13 | Discharge: 2011-11-13 | Disposition: A | Discharge status: Home / Self Care | Payer: 59 | Source: Ambulatory Visit | Attending: Cardiovascular Disease | Admitting: Cardiovascular Disease

## 2011-11-13 ENCOUNTER — Encounter (HOSPITAL_BASED_OUTPATIENT_CLINIC_OR_DEPARTMENT_OTHER)
Admission: RE | Disposition: A | Discharge status: Home / Self Care | Payer: Self-pay | Source: Ambulatory Visit | Attending: Cardiovascular Disease

## 2011-11-13 ENCOUNTER — Encounter (HOSPITAL_BASED_OUTPATIENT_CLINIC_OR_DEPARTMENT_OTHER): Payer: Self-pay | Admitting: Cardiovascular Disease

## 2011-11-13 DIAGNOSIS — I2510 Atherosclerotic heart disease of native coronary artery without angina pectoris: Secondary | ICD-10-CM

## 2011-11-13 DIAGNOSIS — Z9861 Coronary angioplasty status: Secondary | ICD-10-CM | POA: Insufficient documentation

## 2011-11-13 DIAGNOSIS — I739 Peripheral vascular disease, unspecified: Secondary | ICD-10-CM | POA: Insufficient documentation

## 2011-11-13 DIAGNOSIS — R0789 Other chest pain: Secondary | ICD-10-CM | POA: Insufficient documentation

## 2011-11-13 DIAGNOSIS — F17200 Nicotine dependence, unspecified, uncomplicated: Secondary | ICD-10-CM | POA: Insufficient documentation

## 2011-11-13 DIAGNOSIS — I10 Essential (primary) hypertension: Secondary | ICD-10-CM | POA: Insufficient documentation

## 2011-11-13 HISTORY — PX: CARDIAC CATHETERIZATION: SHX172

## 2011-11-13 LAB — POCT I-STAT, CHEM 8
BUN: 9 mg/dL (ref 6–23)
Creatinine, Ser: 0.9 mg/dL (ref 0.50–1.10)
Potassium: 3.8 mEq/L (ref 3.5–5.1)
Sodium: 145 mEq/L (ref 135–145)

## 2011-11-13 SURGERY — JV LEFT HEART CATHETERIZATION WITH CORONARY ANGIOGRAM
Anesthesia: Moderate Sedation

## 2011-11-13 MED ORDER — SODIUM CHLORIDE 0.9 % IV SOLN: INTRAVENOUS | Status: AC

## 2011-11-13 MED ORDER — SODIUM CHLORIDE 0.9 % IJ SOLN: 3.0000 mL | Freq: Two times a day (BID) | INTRAMUSCULAR | Status: DC

## 2011-11-13 MED ORDER — SODIUM CHLORIDE 0.9 % IV SOLN: 250.0000 mL | INTRAVENOUS | Status: DC | PRN

## 2011-11-13 MED ORDER — ONDANSETRON HCL 4 MG/2ML IJ SOLN: 4.0000 mg | Freq: Four times a day (QID) | INTRAMUSCULAR | Status: DC | PRN

## 2011-11-13 MED ORDER — DIAZEPAM 5 MG PO TABS
5.0000 mg | ORAL_TABLET | ORAL | Status: AC
  Administered 2011-11-13: 5 mg via ORAL

## 2011-11-13 MED ORDER — ACETAMINOPHEN 325 MG PO TABS: 650.0000 mg | ORAL_TABLET | ORAL | Status: DC | PRN

## 2011-11-13 MED ORDER — SODIUM CHLORIDE 0.9 % IV SOLN: INTRAVENOUS | Status: DC

## 2011-11-13 MED ORDER — ASPIRIN 81 MG PO CHEW: 324.0000 mg | CHEWABLE_TABLET | ORAL | Status: DC

## 2011-11-13 MED ORDER — SODIUM CHLORIDE 0.9 % IJ SOLN: 3.0000 mL | INTRAMUSCULAR | Status: DC | PRN

## 2011-11-13 NOTE — OR Nursing (Signed)
Dr Nahser at bedside to discuss results and treatment plan with pt and family 

## 2011-11-13 NOTE — OR Nursing (Signed)
Tegaderm dressing applied, site level 0, bedrest begins at 1055 

## 2011-11-13 NOTE — H&P (Signed)
Leah [name] Loss  Date of Birth: 02-27-1951  Medical Record #[id number]  History of Present Illness:  Leah [name] is seen today for evaluation of chest pain and palpitations. She is referred by Dr. Denyse Amass. She has an extensive cardiovascular history. She is status post stenting of the mid LAD in June of 2009 with a 2.5 x 12 mm Promus stent. She had no other significant coronary disease at that time. She reports that she had a stress test 2 years ago that looked okay. She is also status post left carotid endarterectomy in August of 2009. She is status post right external iliac stenting and bilateral renal artery stenting in 2009. In October of 2012 she underwent left femoropopliteal bypass grafting by Dr. Hart Rochester. She has had followup lower extremity Dopplers and carotid Dopplers with Dr. Hart Rochester. She continues to smoke.  Her major complaint today is of intermittent pressure in her chest. She also has a sensation like needles are sticking in her heart. The symptoms are nonexertional. She is not taking any nitroglycerin. She also notes that when she lies down her heart races and flutters. Typically this will last 5 minutes and then resolved. This apparently occurs every time she lies down.  Current Outpatient Prescriptions on File Prior to Visit   Medication  Sig  Dispense  Refill   .  amLODipine (NORVASC) 10 MG tablet  Take 1 tablet (10 mg total) by mouth daily.  90 tablet  3   .  aspirin 325 MG tablet  Take 325 mg by mouth daily.     Marland Kitchen  atorvastatin (LIPITOR) 80 MG tablet  Take 1 tablet (80 mg total) by mouth daily.  90 tablet  3   .  carvedilol (COREG) 3.125 MG tablet  Take 1 tablet (3.125 mg total) by mouth 2 (two) times daily with a meal.  60 tablet  3   .  clopidogrel (PLAVIX) 75 MG tablet  Take 1 tablet (75 mg total) by mouth daily.  30 tablet  6   .  hydrochlorothiazide (HYDRODIURIL) 25 MG tablet  Take 1 tablet (25 mg total) by mouth daily.  90 tablet  1   .  DISCONTD: buPROPion (WELLBUTRIN SR)  150 MG 12 hr tablet  Take 1 tablet (150 mg total) by mouth 2 (two) times daily.  60 tablet  3   .  DISCONTD: simvastatin (ZOCOR) 40 MG tablet  Take 40 mg by mouth at bedtime.      Allergies   Allergen  Reactions   .  Lisinopril  Swelling     Angioedema 06/10/11   .  Chantix (Varenicline)     Past Medical History   Diagnosis  Date   .  Hyperlipidemia    .  Hypertension    .  GERD (gastroesophageal reflux disease)    .  Leg pain    .  Headache    .  Carotid artery occlusion    .  COPD (chronic obstructive pulmonary disease)    .  CAD (coronary artery disease)    .  Peripheral vascular disease    .  Tobacco abuse     Past Surgical History   Procedure  Date   .  Carotid endarterectomy  11/21/2007     left   .  Angioplasty / stenting iliac  2010     right external iliac by Dr. Eldridge Dace   .  Coronary angioplasty with stent placement  6/09     LAD 2.5x12 Promus   .  Femoral-popliteal bypass graft  10/12     left Dr. Hart Rochester    History   Smoking status   .  Current Everyday Smoker -- 0.5 packs/day for 35 years   .  Types:  Cigarettes   Smokeless tobacco   .  Never Used    Comment: has not started wellbutrin yet    History   Alcohol Use  No    Family History   Problem  Relation  Age of Onset   .  Cancer  Mother       BRAIN AND LUNG    .  Hypertension  Father    .  Heart disease  Father     Review of Systems:  The review of systems is positive for continued tobacco abuse. She is sedentary. She denies any significant claudication symptoms. She's had no recent TIA or CVA symptoms.All other systems were reviewed and are negative.  Physical Exam:  BP 136/62  Pulse 82  Ht 6\' 1"  (1.854 m)  Wt 173 lb (78.472 kg)  BMI 22.82 kg/m2  SpO2 92%  She is a thin black female in no acute distress. She is normocephalic, atraumatic. Pupils are equal round and reactive to light accommodation. Extraocular movements are full. Sclera are clear. Oropharynx is clear. Neck is supple without  JVD, adenopathy, thyromegaly, or bruits. She has an old left CEA scar. Lungs are clear. Cardiac exam reveals a regular rate and rhythm without gallop, murmur, or click. Abdomen is soft and nontender without masses or bruits. She has bilateral femoral bruits with palpable pulses. Her pedal pulses are trace on the left and 1+ on the right. Skin is warm and dry. She is alert and oriented x3. Cranial nerves II through XII are intact. She has no focal motor or sensory deficits. Mood is appropriate.  LABORATORY DATA:  ECG dated 09/04/2011 shows normal sinus rhythm with minor nonspecific ST abnormality.  Recent chemistry panel was normal except for glucose of 105. Hemoglobin was 8.9. TSH 0.45. LDL cholesterol was 185.   Cardiology Nuclear Med Study  Leah [name] is a 61 y.o. female MRN : [id number] DOB: 07-03-1950  Procedure Date: 10/28/2011  Nuclear Med Background  Indication for Stress Test: Evaluation for Ischemia, Stent Patency and Abnormal EKG  History: '09 MPS:No ischemia, EF=42%; '09 Stent-LAD, EF=55%.  Cardiac Risk Factors: Carotid Disease, Family History - CAD, Hypertension, Lipids, PVD and Smoker  Symptoms: Chest Pain, Chest Pressure. (last date of chest discomfort was last night), Dizziness, DOE, Fatigue, Palpitations and Rapid HR  Nuclear Pre-Procedure  Caffeine/Decaff Intake: None  NPO After: 9:30pm   Lungs: Clear.  IV 0.9% NS with Angio Cath: 20g   IV Site: R Antecubital  IV Started by: Stanton Kidney, EMT-P   Chest Size (in): 42  Cup Size: DD   Height: 6\' 1"  (1.854 m)  Weight: 172 lb (78.019 kg)   BMI: Body mass index is 22.69 kg/(m^2).  Tech Comments: Coreg held > 36 hours, per patient.   Nuclear Med Study  1 or 2 day study: 1 day  Stress Test Type: Stress   Reading MD: Willa Rough, MD  Order Authorizing Provider: Peter Swaziland, MD   Resting Radionuclide: Technetium 45m Tetrofosmin  Resting Radionuclide Dose: 10.8 mCi   Stress Radionuclide: Technetium 32m Tetrofosmin  Stress  Radionuclide Dose: 32.8 mCi   Stress Protocol  Rest HR: 68  Stress HR: 157   Rest BP: Sitting 168/77 Standing 135/85  Stress BP: 205/106   Exercise Time (min):  4:30  METS: 6.4   Predicted Max HR: 162 bpm  % Max HR: 96.91 bpm  Rate Pressure Product: 16109  Dose of Adenosine (mg): n/a  Dose of Lexiscan: n/a mg   Dose of Atropine (mg): n/a  Dose of Dobutamine: n/a mcg/kg/min (at max HR)   Stress Test Technologist: Smiley Houseman, CMA-N  Nuclear Technologist: Doyne Keel, CNMT   Rest Procedure: Myocardial perfusion imaging was performed at rest 45 minutes following the intravenous administration of Technetium 76m Tetrofosmin.  Rest ECG: Nonspecific ST-T wave changes with occasional PVC's.  Stress Procedure: The patient exercised on the treadmill utilizing the Bruce Protocol for 4:30 minutes. She then stopped due to bilateral thigh pain and shortness of breath. She denied any chest pain. There were ST-T wave changes, greater in recovery. She also had a hypertensive response to exercise, 205/106. Technetium 24m Tetrofosmin was injected at peak exercise and myocardial perfusion imaging was performed after a brief delay.  EKG's and images were discussed with Dr. Patty Sermons, DOD, he felt it was safe for the patient to leave and follow up with Dr. Swaziland with a possible catherization.  Stress ECG: There were nonspecific ST changes. See discussion below.  QPS  Raw Data Images: Patient motion noted; appropriate software correction applied.  Stress Images: There is a small to moderate area of decreased activity affecting the apical cap, apical inferior segment, and apical septal segment. The degree of photon reduction is moderate.  Rest Images: There is a small to moderate area of decreased activity affecting the apical cap, apical inferior segment, And apical septal segment. The degree of photon reduction is mild to moderate.  Subtraction (SDS): There is slight reversibility suggesting slight ischemia.    Transient Ischemic Dilatation (Normal <1.22): 0.95  Lung/Heart Ratio (Normal <0.45): 0.42  Quantitative Gated Spect Images  QGS EDV: 79 ml  QGS ESV: 26 ml  Impression  Exercise Capacity: Exercise capacity is limited. See discussion below  BP Response: Hypertensive blood pressure response.  Clinical Symptoms: The patient had significant bilateral claudication.  ECG Impression: There were nonspecific ST changes. See discussion below.  Comparison with Prior Nuclear Study: No images to compare  Overall Impression: The resting EKG reveals ST flattening. The patient exercised for 4-1/2 minutes. There was very early rise with significant stress-induced hypertension. The patient did not have chest pain. There was bilateral thigh pain and shortness of breath that limited any further exercise. The EKGs in recovery showed slight worsening of the diffuse ST flattening. The changes are not diagnostic. The nuclear images reveal a defect in the apical cap and the apical segments of the inferior wall and the inferior septum. There may be slight reversibility. There may be mild scar and mild ischemia. This study is abnormal. It is low to moderate risk.  LV Ejection Fraction: 68%. LV Wall Motion: Normal Wall Motion.  Willa Rough, MD     Assessment / Plan:  1. Chest pain. Known history of CAD with prior DES to mid LAD. Stress myoview is abnormal with apical perfusion abnormality. Symptoms are atypical but given history cardiac cath is recommended.   2. PAD with multiple revascularization procedures as noted.  3. Tobacco abuse  4. HTN  5. Hypokalemia now repleted. Potassium 3.8 today.

## 2011-11-13 NOTE — Interval H&P Note (Signed)
History and Physical Interval Note:  11/13/2011 10:40 AM  Leah [name]  has presented today for surgery, with the diagnosis of abn myo  The various methods of treatment have been discussed with the patient and family. After consideration of risks, benefits and other options for treatment, the patient has consented to  Procedure(s) (LRB): JV LEFT HEART CATHETERIZATION WITH CORONARY ANGIOGRAM (N/A) as a surgical intervention .  The patient's history has been reviewed, patient examined, no change in status, stable for surgery.  I have reviewed the patient's chart and labs.  Questions were answered to the patient's satisfaction.     Elyn Aquas.

## 2011-11-13 NOTE — CV Procedure (Signed)
    Cardiac Cath Note  Leah [name] [id number] Oct 05, 1950  Procedure: Left  Heart Cardiac Catheterization Note Indications: chest tightness, hx of CAD, PVD  Procedure Details Consent: Obtained Time Out: Verified patient identification, verified procedure, site/side was marked, verified correct patient position, special equipment/implants available, Radiology Safety Procedures followed,  medications/allergies/relevent history reviewed, required imaging and test results available.  Performed   Medications: Fentanyl: 50 mcg IV Versed: 2 mg IV  The right femoral artery was easily canulated using a modified Seldinger technique.  Hemodynamics:    LV pressure: 160/30 Aortic pressure: 160/73  Angiography   Left Main: smooth and normal   Left anterior Descending: The vessels are very tortuous c/w hypertensive heart diease.  There are minor irregularities in the proximal LAD.  There is a stent in the mid LAD that is patent.  There is a ~50% stenosis just distal to the stent.  The flow through this region is quite brisk.    There is a large diagonal ( located where a 2nd diagonal might be) .  There is a 40-50% stenosis at it's origin.  Left Circumflex: large vessel.  Tortuous.  There is a large OM branch that has minor luminal irregularities.  Right Coronary Artery: large and dominant,  Smooth and normal  LV Gram: lots of ventricular ectopy with placement of the catheter.  Hyperdynamic LV function.  EF 65-70% ( and possibly more).  Complications: No apparent complications Patient did tolerate procedure well.  Contrast used: 80 cc  Conclusions:   1. Moderate CAD involving the mid LAD just distal to the mid LAD stent.  This lesion does not appear to obstruct blood flow.  She may need a myoview but I think she will greatly benefit from increased beta blocker.  Will increase her coreg. 2. Hyperdynamic LV function   Vesta Mixer, Montez Hageman., MD, [facility] 11/13/2011, 10:41 AM Office -  [phone] Pager [phone]

## 2011-11-13 NOTE — OR Nursing (Signed)
Meal served 

## 2011-11-13 NOTE — Interval H&P Note (Signed)
History and Physical Interval Note:  11/13/2011 9:17 AM  Leah [name]  has presented today for surgery, with the diagnosis of abn myo  The various methods of treatment have been discussed with the patient and family. After consideration of risks, benefits and other options for treatment, the patient has consented to  Procedure(s) (LRB): JV LEFT HEART CATHETERIZATION WITH CORONARY ANGIOGRAM (N/A) as a surgical intervention .  The patient's history has been reviewed, patient examined, no change in status, stable for surgery.  I have reviewed the patient's chart and labs.  Questions were answered to the patient's satisfaction.     Theron Arista [facility] 11/13/2011 9:17 AM

## 2011-11-13 NOTE — OR Nursing (Signed)
Negative Allen's test right hand 

## 2011-11-14 ENCOUNTER — Telehealth: Payer: Self-pay

## 2011-11-14 NOTE — Telephone Encounter (Signed)
Patient called stated she felt good this morning,cath site sore, but no redness,no swelling.Appointment already scheduled before she left hospital yesterday with Dr.Jordan 11/20/11.

## 2011-11-20 ENCOUNTER — Ambulatory Visit: Payer: 59 | Admitting: Cardiology

## 2011-11-22 ENCOUNTER — Other Ambulatory Visit: Payer: Self-pay | Admitting: *Deleted

## 2011-11-22 DIAGNOSIS — I6529 Occlusion and stenosis of unspecified carotid artery: Secondary | ICD-10-CM

## 2011-11-22 DIAGNOSIS — Z48812 Encounter for surgical aftercare following surgery on the circulatory system: Secondary | ICD-10-CM

## 2011-11-28 ENCOUNTER — Encounter: Payer: Self-pay | Admitting: Neurosurgery

## 2011-11-29 ENCOUNTER — Other Ambulatory Visit: Payer: 59

## 2011-11-29 ENCOUNTER — Ambulatory Visit: Payer: 59 | Admitting: Neurosurgery

## 2011-11-29 ENCOUNTER — Encounter: Payer: Self-pay | Admitting: Neurosurgery

## 2011-11-29 ENCOUNTER — Encounter: Payer: Self-pay | Admitting: Cardiology

## 2011-12-02 ENCOUNTER — Ambulatory Visit (INDEPENDENT_AMBULATORY_CARE_PROVIDER_SITE_OTHER): Payer: 59 | Admitting: Neurosurgery

## 2011-12-02 ENCOUNTER — Encounter: Payer: Self-pay | Admitting: Neurosurgery

## 2011-12-02 ENCOUNTER — Other Ambulatory Visit (INDEPENDENT_AMBULATORY_CARE_PROVIDER_SITE_OTHER): Payer: 59 | Admitting: *Deleted

## 2011-12-02 VITALS — BP 134/80 | HR 57 | Resp 14 | Ht 74.5 in | Wt 174.5 lb

## 2011-12-02 DIAGNOSIS — I6529 Occlusion and stenosis of unspecified carotid artery: Secondary | ICD-10-CM

## 2011-12-02 DIAGNOSIS — Z48812 Encounter for surgical aftercare following surgery on the circulatory system: Secondary | ICD-10-CM

## 2011-12-02 DIAGNOSIS — I739 Peripheral vascular disease, unspecified: Secondary | ICD-10-CM

## 2011-12-02 NOTE — Progress Notes (Signed)
[facility] Carotid Office Note  CC: Annual carotid duplex Referring Physician: Hart Rochester  History of Present Illness: [name] status post left CEA in 2009. The patient denies any signs or symptoms of CVA, TIA, amaurosis fugax or any neural deficit. The patient denies any new medical diagnoses or recent surgeries and has done well since her left lower extremity bypass graft.  Past Medical History  Diagnosis Date  . Hyperlipidemia   . Hypertension   . GERD (gastroesophageal reflux disease)   . Leg pain   . Headache   . Carotid artery occlusion   . COPD (chronic obstructive pulmonary disease)   . CAD (coronary artery disease)   . Peripheral vascular disease   . Tobacco abuse     ROS: [x]  Positive   [ ]  Denies    General: [ ]  Weight loss, [ ]  Fever, [ ]  chills Neurologic: [ ]  Dizziness, [ ]  Blackouts, [ ]  Seizure [ ]  Stroke, [ ]  "Mini stroke", [ ]  Slurred speech, [ ]  Temporary blindness; [ ]  weakness in arms or legs, [ ]  Hoarseness Cardiac: [ ]  Chest pain/pressure, [ ]  Shortness of breath at rest [ ]  Shortness of breath with exertion, [ ]  Atrial fibrillation or irregular heartbeat Vascular: [ ]  Pain in legs with walking, [ ]  Pain in legs at rest, [ ]  Pain in legs at night,  [ ]  Non-healing ulcer, [ ]  Blood clot in vein/DVT,   Pulmonary: [ ]  Home oxygen, [ ]  Productive cough, [ ]  Coughing up blood, [ ]  Asthma,  [ ]  Wheezing Musculoskeletal:  [ ]  Arthritis, [ ]  Low back pain, [ ]  Joint pain Hematologic: [ ]  Easy Bruising, [ ]  Anemia; [ ]  Hepatitis Gastrointestinal: [ ]  Blood in stool, [ ]  Gastroesophageal Reflux/heartburn, [ ]  Trouble swallowing Urinary: [ ]  chronic Kidney disease, [ ]  on HD - [ ]  MWF or [ ]  TTHS, [ ]  Burning with urination, [ ]  Difficulty urinating Skin: [ ]  Rashes, [ ]  Wounds Psychological: [ ]  Anxiety, [ ]  Depression   Social History History  Substance Use Topics  . Smoking status: Current Everyday  Smoker -- 0.5 packs/day for 35 years    Types: Cigarettes  . Smokeless tobacco: Never Used   Comment: has not started wellbutrin yet  . Alcohol Use: No    Family History Family History  Problem Relation Age of Onset  . Cancer Mother     BRAIN AND LUNG  . Hypertension Father   . Heart disease Father     Allergies  Allergen Reactions  . Lisinopril Swelling    Angioedema 06/10/11  . Chantix (Varenicline)     insomnia    Current Outpatient Prescriptions  Medication Sig Dispense Refill  . amLODipine (NORVASC) 10 MG tablet Take 1 tablet (10 mg total) by mouth daily.  90 tablet  3  . amLODipine (NORVASC) 10 MG tablet Take by mouth daily.      Marland Kitchen aspirin 325 MG tablet Take 325 mg by mouth daily.        Marland Kitchen atorvastatin (LIPITOR) 80 MG tablet Take 1 tablet (80 mg total) by mouth daily.  90 tablet  3  . carvedilol (COREG) 3.125 MG tablet Take 1 tablet (3.125 mg total) by mouth 2 (two) times daily with a meal.  60 tablet  3  . Cephalexin 500 MG tablet Take by mouth Twice daily.      . clopidogrel (PLAVIX) 75 MG tablet Take 1  tablet (75 mg total) by mouth daily.  30 tablet  6  . COREG 3.125 MG tablet Take by mouth daily.      . hydrochlorothiazide (HYDRODIURIL) 25 MG tablet Take 1 tablet (25 mg total) by mouth daily.  90 tablet  1  . potassium chloride SA (K-DUR,KLOR-CON) 20 MEQ tablet Take 1 tablet (20 mEq total) by mouth 2 (two) times daily.  60 tablet  6  . Potassium Chloride (KLOR-CON PO) daily.      Marland Kitchen DISCONTD: buPROPion (WELLBUTRIN SR) 150 MG 12 hr tablet Take 1 tablet (150 mg total) by mouth 2 (two) times daily.  60 tablet  3  . DISCONTD: simvastatin (ZOCOR) 40 MG tablet Take 40 mg by mouth at bedtime.         Physical Examination  Filed Vitals:   12/02/11 1026  BP: 134/80  Pulse: 57  Resp:     Body mass index is 22.10 kg/(m^2).  General:  WDWN in NAD Gait: Normal HEENT: WNL Eyes: Pupils equal Pulmonary: normal non-labored breathing , without Rales, rhonchi,   wheezing Cardiac: RRR, without  Murmurs, rubs or gallops; Abdomen: soft, NT, no masses Skin: no rashes, ulcers noted  Vascular Exam Pulses: 3+ radial pulses bilaterally Carotid bruits: Carotid pulses to auscultation no bruits are heard Extremities without ischemic changes, no Gangrene , no cellulitis; no open wounds;  Musculoskeletal: no muscle wasting or atrophy   Neurologic: A&O X 3; Appropriate Affect ; SENSATION: normal; MOTOR FUNCTION:  moving all extremities equally. Speech is fluent/normal  Non-Invasive Vascular Imaging CAROTID DUPLEX 12/02/2011  Right ICA 20 - 39 % stenosis Left ICA 0 - 19% stenosis   ASSESSMENT/PLAN: Asymptomatic patient with minimal bilateral carotid stenosis. The patient will followup in one year with repeat carotid duplex we will also duplex her left lower extremity the same time. The patient's questions were encouraged and answered, she is in agreement with this plan.  Lauree Chandler ANP   Clinic MD: Myra Gianotti

## 2011-12-25 ENCOUNTER — Ambulatory Visit (INDEPENDENT_AMBULATORY_CARE_PROVIDER_SITE_OTHER): Payer: 59 | Admitting: Cardiology

## 2011-12-25 ENCOUNTER — Encounter: Payer: Self-pay | Admitting: Cardiology

## 2011-12-25 VITALS — BP 132/70 | HR 68 | Ht 74.5 in | Wt 181.8 lb

## 2011-12-25 DIAGNOSIS — E785 Hyperlipidemia, unspecified: Secondary | ICD-10-CM

## 2011-12-25 DIAGNOSIS — F17200 Nicotine dependence, unspecified, uncomplicated: Secondary | ICD-10-CM

## 2011-12-25 DIAGNOSIS — I70219 Atherosclerosis of native arteries of extremities with intermittent claudication, unspecified extremity: Secondary | ICD-10-CM

## 2011-12-25 DIAGNOSIS — I4949 Other premature depolarization: Secondary | ICD-10-CM

## 2011-12-25 DIAGNOSIS — I493 Ventricular premature depolarization: Secondary | ICD-10-CM

## 2011-12-25 DIAGNOSIS — I2510 Atherosclerotic heart disease of native coronary artery without angina pectoris: Secondary | ICD-10-CM

## 2011-12-25 DIAGNOSIS — I10 Essential (primary) hypertension: Secondary | ICD-10-CM

## 2011-12-25 NOTE — Patient Instructions (Signed)
Continue your current medication  Quit smoking  Walk more every day.  I will see you again in 6 months.

## 2011-12-25 NOTE — Progress Notes (Signed)
Leah [name] Date of Birth: 29-Jan-1951 Medical Record #[id number]  History of Present Illness: Leah [name] is seen today for followup. She has an extensive cardiovascular history. She is status post stenting of the mid LAD in June of 2009 with a 2.5 x 12 mm Promus stent. She is also status post left carotid endarterectomy in August of 2009. She is status post right external iliac stenting and bilateral renal artery stenting in 2009. In October of 2012 she underwent left femoropopliteal bypass grafting by Dr. Hart Rochester. When last seen in August she complained of chest pain and increased palpitations. A nuclear stress test showed a small area of apical ischemia. This led to cardiac catheterization which demonstrated continued patency of the stent in the LAD. There was a 50% stenosis distal to the stent. Otherwise she had nonobstructive disease. She was treated medically with an increase in her carvedilol dose. She also had a 24-hour Holter monitor which demonstrated occasional PVCs and couplets. There were no sustained arrhythmias. She has had recent followup carotid Doppler studies which demonstrated no significant obstruction. She does continue to smoke. Reports she only walks at work. She states her symptoms of chest discomfort and palpitations have resolved.   Current Outpatient Prescriptions on File Prior to Visit  Medication Sig Dispense Refill  . amLODipine (NORVASC) 10 MG tablet Take 1 tablet (10 mg total) by mouth daily.  90 tablet  3  . amLODipine (NORVASC) 10 MG tablet Take by mouth daily.      Marland Kitchen aspirin 325 MG tablet Take 325 mg by mouth daily.        Marland Kitchen atorvastatin (LIPITOR) 80 MG tablet Take 1 tablet (80 mg total) by mouth daily.  90 tablet  3  . Cephalexin 500 MG tablet Take by mouth Twice daily.      . clopidogrel (PLAVIX) 75 MG tablet Take 1 tablet (75 mg total) by mouth daily.  30 tablet  6  . COREG 3.125 MG tablet Take by mouth daily.      . Potassium Chloride (KLOR-CON  PO) daily.      . potassium chloride SA (K-DUR,KLOR-CON) 20 MEQ tablet Take 1 tablet (20 mEq total) by mouth 2 (two) times daily.  60 tablet  6  . carvedilol (COREG) 3.125 MG tablet Take 1 tablet (3.125 mg total) by mouth 2 (two) times daily with a meal.  60 tablet  3  . DISCONTD: buPROPion (WELLBUTRIN SR) 150 MG 12 hr tablet Take 1 tablet (150 mg total) by mouth 2 (two) times daily.  60 tablet  3  . DISCONTD: simvastatin (ZOCOR) 40 MG tablet Take 40 mg by mouth at bedtime.         Allergies  Allergen Reactions  . Lisinopril Swelling    Angioedema 06/10/11  . Chantix (Varenicline)     insomnia    Past Medical History  Diagnosis Date  . Hyperlipidemia   . Hypertension   . GERD (gastroesophageal reflux disease)   . Leg pain   . Headache   . Carotid artery occlusion   . COPD (chronic obstructive pulmonary disease)   . CAD (coronary artery disease)   . Peripheral vascular disease   . Tobacco abuse     Past Surgical History  Procedure Date  . Carotid endarterectomy 11/21/2007    left  . Angioplasty / stenting iliac 2010    right external iliac by Dr. Eldridge Dace  . Coronary angioplasty with stent placement 6/09    LAD 2.5x12 Promus  .  Femoral-popliteal bypass graft 10/12    left Dr. Hart Rochester  . Abdominal hysterectomy   . Cardiac catheterization 11/13/11    Left Heart Cath. with Coronary Angiogram    History  Smoking status  . Current Every Day Smoker -- 0.5 packs/day for 35 years  . Types: Cigarettes  Smokeless tobacco  . Never Used  Comment: has not started wellbutrin yet    History  Alcohol Use No    Family History  Problem Relation Age of Onset  . Cancer Mother     BRAIN AND LUNG  . Hypertension Father   . Heart disease Father     Review of Systems: The review of systems is positive for continued tobacco abuse. She is sedentary. She denies any significant claudication symptoms. She's had no recent TIA or CVA symptoms.All other systems were reviewed and are  negative.  Physical Exam: BP 132/70  Pulse 68  Ht 6' 2.5" (1.892 m)  Wt 181 lb 12.8 oz (82.464 kg)  BMI 23.03 kg/m2  SpO2 97% She is a thin black female in no acute distress. She is normocephalic, atraumatic. Pupils are equal round and reactive to light accommodation. Extraocular movements are full. Sclera are clear. Oropharynx is clear. Neck is supple without JVD, adenopathy, thyromegaly, or bruits. She has an old left CEA scar. Lungs are clear. Cardiac exam reveals a regular rate and rhythm without gallop, murmur, or click. Abdomen is soft and nontender without masses or bruits. She has bilateral femoral bruits with palpable pulses. Her pedal pulses are trace on the left and 1+ on the right. Skin is warm and dry. She is alert and oriented x3. Cranial nerves II through XII are intact. She has no focal motor or sensory deficits. Mood is appropriate.  LABORATORY DATA: ECG demonstrates normal sinus rhythm with a normal ECG.  Assessment / Plan: 1. Coronary disease status post stenting of the mid LAD in June of 2009. Recent cardiac catheterization demonstrated continued patency. No obstructive disease. Chest pain symptoms have resolved with increased carvedilol dose.  2. Palpitations. She does have occasional PVCs. These symptoms are also improved with her increasing carvedilol dose.  3. Tobacco abuse. I strongly recommended smoking cessation.  4. Peripheral arterial disease as noted in history of present illness. I recommended increased walking activity.  5. Hypercholesterolemia. Reviewing her last blood work her LDL was 119 in March and 185 in June. I cannot explain the difference. She is on high-dose Lipitor therapy and reports compliance.

## 2012-01-01 ENCOUNTER — Encounter: Payer: Self-pay | Admitting: Cardiology

## 2012-01-31 ENCOUNTER — Encounter: Payer: Self-pay | Admitting: Neurosurgery

## 2012-02-03 ENCOUNTER — Ambulatory Visit: Payer: 59 | Admitting: Neurosurgery

## 2012-02-28 ENCOUNTER — Encounter: Payer: Self-pay | Admitting: Neurosurgery

## 2012-03-02 ENCOUNTER — Ambulatory Visit: Payer: 59 | Admitting: Neurosurgery

## 2012-04-27 ENCOUNTER — Ambulatory Visit: Payer: 59 | Admitting: Neurosurgery

## 2012-04-27 ENCOUNTER — Other Ambulatory Visit: Payer: 59

## 2012-05-19 ENCOUNTER — Other Ambulatory Visit: Payer: Self-pay | Admitting: *Deleted

## 2012-05-20 MED ORDER — CLOPIDOGREL BISULFATE 75 MG PO TABS: 75.0000 mg | ORAL_TABLET | Freq: Every day | ORAL | Status: DC

## 2012-06-08 DIAGNOSIS — I70219 Atherosclerosis of native arteries of extremities with intermittent claudication, unspecified extremity: Secondary | ICD-10-CM

## 2012-07-02 ENCOUNTER — Encounter: Payer: Self-pay | Admitting: Family Medicine

## 2012-07-02 ENCOUNTER — Ambulatory Visit (INDEPENDENT_AMBULATORY_CARE_PROVIDER_SITE_OTHER): Payer: Self-pay | Admitting: Family Medicine

## 2012-07-02 VITALS — BP 150/80 | HR 80 | Ht 74.0 in | Wt 181.9 lb

## 2012-07-02 DIAGNOSIS — M549 Dorsalgia, unspecified: Secondary | ICD-10-CM

## 2012-07-02 DIAGNOSIS — M545 Low back pain: Secondary | ICD-10-CM

## 2012-07-02 DIAGNOSIS — I70219 Atherosclerosis of native arteries of extremities with intermittent claudication, unspecified extremity: Secondary | ICD-10-CM

## 2012-07-02 DIAGNOSIS — G4700 Insomnia, unspecified: Secondary | ICD-10-CM

## 2012-07-02 DIAGNOSIS — F17200 Nicotine dependence, unspecified, uncomplicated: Secondary | ICD-10-CM

## 2012-07-02 DIAGNOSIS — I739 Peripheral vascular disease, unspecified: Secondary | ICD-10-CM

## 2012-07-02 LAB — COMPREHENSIVE METABOLIC PANEL
AST: 11 U/L (ref 0–37)
Albumin: 4.2 g/dL (ref 3.5–5.2)
BUN: 9 mg/dL (ref 6–23)
CO2: 26 mEq/L (ref 19–32)
Calcium: 10.1 mg/dL (ref 8.4–10.5)
Chloride: 106 mEq/L (ref 96–112)
Creat: 0.74 mg/dL (ref 0.50–1.10)
Glucose, Bld: 90 mg/dL (ref 70–99)
Potassium: 4.1 mEq/L (ref 3.5–5.3)

## 2012-07-02 LAB — POCT URINALYSIS DIPSTICK
Bilirubin, UA: NEGATIVE
Glucose, UA: NEGATIVE
Nitrite, UA: NEGATIVE
Urobilinogen, UA: 0.2

## 2012-07-02 LAB — POCT UA - MICROSCOPIC ONLY

## 2012-07-02 MED ORDER — METRONIDAZOLE 500 MG PO TABS: 2000.0000 mg | ORAL_TABLET | Freq: Once | ORAL | Status: DC

## 2012-07-02 MED ORDER — TRAZODONE HCL 50 MG PO TABS: 25.0000 mg | ORAL_TABLET | Freq: Every evening | ORAL | Status: DC | PRN

## 2012-07-02 MED ORDER — CYCLOBENZAPRINE HCL 10 MG PO TABS: 10.0000 mg | ORAL_TABLET | Freq: Three times a day (TID) | ORAL | Status: DC | PRN

## 2012-07-02 MED ORDER — TRAMADOL HCL 50 MG PO TABS: 50.0000 mg | ORAL_TABLET | Freq: Three times a day (TID) | ORAL | Status: DC | PRN

## 2012-07-02 NOTE — Patient Instructions (Addendum)
For the back, I think you have some back spasms which we can treat with a muscle relaxant and pain medicine. I would like to check some lab work to make sure your kidneys are ok. Avoid taking more than one aspirin a day.  If the pain gets worst or doesn't get better with the medicine, please come back. Follow up in 3-4 weeks.   For the sleep, avoid caffeinated drinks and stop fluids 5 hours before bedtime.

## 2012-07-02 NOTE — Progress Notes (Signed)
Patient ID: Leah [name] [facility]    DOB: [birth year], 62 y.o.   MRN: [id number] --- Subjective:  Leah [name] is a 62 y.o.female who presents with back pain. - back pain: started worst 3 weeks ago, constant. Starts in the middle of the back, sometimes radiates to the right leg with burning and heat sensation. Constant nagging ache.   Dull numbness in left leg where surgery was done. Feet burning. No recent dysuria. No abdominal pain. No fever.  Taking over the counter medicine 8-9 times per day without relief. Laying on either side makes it worst. Bending is worst. No weakness in legs, no loss of sensation. No bowel or urine incontinence.   - sleep: is able to fall asleep but wakes up after 2 hours or so and can't go back to sleep. She used to work the night shift and has had trouble sleeping ever since she quit working. She denies any feelings of sadness or depression.  She drinks caffeinated sodas all day. She wakes up needing to urinate. She has tried over the counter sleep aids which helped initially but have stopped helping. When going to bed, she turns the TV and the lights off. Gets 6 hours at night at most and 2-3 at the least  ROS: see HPI Past Medical History: reviewed and updated medications and allergies. Social History: Tobacco: 1 pack per day. Knows she needs to quit but is not quite ready  Objective: Filed Vitals:   07/02/12 1453  BP: 150/80  Pulse: 80    Physical Examination:   General appearance - alert, well appearing, and in no distress Chest - clear to auscultation, no wheezes, rales or rhonchi, symmetric air entry Heart - normal rate, regular rhythm, normal S1, S2, no murmurs, rubs, clicks or gallops Abdomen - soft, nontender, nondistended, no CVA tenderness bilaterally MSK - back: tenderness to palpation along Lumbar spine as well as along lumbar paraspinal muscles bilaterally, negtive straight leg, 5/5 strength in lower extremities bilaterally.

## 2012-07-04 LAB — URINE CULTURE: Colony Count: NO GROWTH

## 2012-07-05 DIAGNOSIS — G4700 Insomnia, unspecified: Secondary | ICD-10-CM | POA: Insufficient documentation

## 2012-07-05 DIAGNOSIS — M545 Low back pain: Secondary | ICD-10-CM | POA: Insufficient documentation

## 2012-07-05 NOTE — Assessment & Plan Note (Signed)
Difficulty staying asleep. Encouraged cutting out caffeine and avoiding fluids 5-6 hrs before bedtime to avoid being woken up in the night for urination. Also Rx for trazadone.

## 2012-07-05 NOTE — Assessment & Plan Note (Addendum)
status post stenting of the mid LAD in June of 2009 with a 2.5 x 12 mm Promus stent. She is also status post left carotid endarterectomy in August of 2009. She is status post right external iliac stenting and bilateral renal artery stenting in 2009. October of 2012: left femoropopliteal bypass grafting by Dr. Hart Rochester

## 2012-07-05 NOTE — Assessment & Plan Note (Signed)
Patient not ready to quit, although she does know the importance of her quitting in the setting of her peripheral vascular disease.

## 2012-07-05 NOTE — Assessment & Plan Note (Signed)
Musculoskeletal in nature. No evidence of UTI on UA that could be causing low back pain.  Will treat with tramadol and flexeril and follow up in 2 weeks. If not better, will get lumbar xray.  Will check CMP to check kidney and liver function as patient has been taking a lot of over the counter medicine.

## 2012-07-08 ENCOUNTER — Telehealth: Payer: Self-pay | Admitting: Family Medicine

## 2012-07-08 NOTE — Telephone Encounter (Signed)
Is having a hard time getting her Plavix and was told to let Dr Gwenlyn Saran if she cannot afford it.  wants to know what to do

## 2012-07-08 NOTE — Telephone Encounter (Signed)
Forward to PCP for cheaper Rx.Busick, Robert Lee  

## 2012-07-10 ENCOUNTER — Telehealth: Payer: Self-pay | Admitting: Family Medicine

## 2012-07-10 NOTE — Telephone Encounter (Signed)
Called patient to touch base about her plavix. She states that it is not provided through the medication assistance program and that she would be able to get it for discount price at [facility] or [facility] with the orange card. She doesn't have an appointment for the orange card before May.  I told her I would be on the lookout for any other options to get the plavix as it is important for her to fill it.  She expressed understanding.   [name], PGY-2 Family Medicine Resident

## 2012-07-11 NOTE — Telephone Encounter (Signed)
Opened in error

## 2012-07-17 ENCOUNTER — Encounter: Payer: Self-pay | Admitting: Cardiology

## 2012-07-17 ENCOUNTER — Ambulatory Visit (INDEPENDENT_AMBULATORY_CARE_PROVIDER_SITE_OTHER): Payer: Self-pay | Admitting: Cardiology

## 2012-07-17 VITALS — BP 142/70 | HR 88 | Ht 74.0 in | Wt 186.0 lb

## 2012-07-17 DIAGNOSIS — E785 Hyperlipidemia, unspecified: Secondary | ICD-10-CM

## 2012-07-17 DIAGNOSIS — I2510 Atherosclerotic heart disease of native coronary artery without angina pectoris: Secondary | ICD-10-CM

## 2012-07-17 DIAGNOSIS — F17200 Nicotine dependence, unspecified, uncomplicated: Secondary | ICD-10-CM

## 2012-07-17 DIAGNOSIS — I493 Ventricular premature depolarization: Secondary | ICD-10-CM

## 2012-07-17 DIAGNOSIS — I4949 Other premature depolarization: Secondary | ICD-10-CM

## 2012-07-17 DIAGNOSIS — I10 Essential (primary) hypertension: Secondary | ICD-10-CM

## 2012-07-17 NOTE — Patient Instructions (Signed)
Continue your current therapy  I will see you in 6 months.   

## 2012-07-17 NOTE — Progress Notes (Signed)
Leah [name] Date of Birth: April 01, 1950 Medical Record #[id number]  History of Present Illness: Leah [name] is seen today for followup. She has an extensive cardiovascular history. She is status post stenting of the mid LAD in June of 2009 with a 2.5 x 12 mm Promus stent. She is also status post left carotid endarterectomy in August of 2009. She is status post right external iliac stenting and bilateral renal artery stenting in 2009. In October of 2012 she underwent left femoropopliteal bypass grafting by Dr. Hart Rochester. In August 2013 she had some chest pain. A Myoview study showed mild apical ischemia. She had repeat cardiac catheterization which demonstrated a 50% stenosis in the LAD at the distal stent margin. Otherwise nonobstructive disease. She has been managed medically. Right now she is having a difficult, 40 her medications. She ran out of her Plavix one month ago. She denies any increase claudication. She did retire from her job and is applying for disability. She is walking a little bit more. She denies any chest pain currently. She quit smoking one day ago.   Current Outpatient Prescriptions on File Prior to Visit  Medication Sig Dispense Refill  . amLODipine (NORVASC) 10 MG tablet Take 1 tablet (10 mg total) by mouth daily.  90 tablet  3  . aspirin 325 MG tablet Take 325 mg by mouth daily.        . carvedilol (COREG) 3.125 MG tablet Take 1 tablet (3.125 mg total) by mouth 2 (two) times daily with a meal.  60 tablet  3  . Cephalexin 500 MG tablet Take by mouth Twice daily.      . cyclobenzaprine (FLEXERIL) 10 MG tablet Take 1 tablet (10 mg total) by mouth 3 (three) times daily as needed for muscle spasms.  30 tablet  0  . metroNIDAZOLE (FLAGYL) 500 MG tablet Take 4 tablets (2,000 mg total) by mouth once.  4 tablet  0  . potassium chloride SA (K-DUR,KLOR-CON) 20 MEQ tablet Take 1 tablet (20 mEq total) by mouth 2 (two) times daily.  60 tablet  6  . traMADol (ULTRAM) 50 MG tablet  Take 1 tablet (50 mg total) by mouth every 8 (eight) hours as needed for pain.  60 tablet  1  . traZODone (DESYREL) 50 MG tablet Take 0.5-1 tablets (25-50 mg total) by mouth at bedtime as needed for sleep.  30 tablet  3  . atorvastatin (LIPITOR) 80 MG tablet Take 1 tablet (80 mg total) by mouth daily.  90 tablet  3  . clopidogrel (PLAVIX) 75 MG tablet Take 1 tablet (75 mg total) by mouth daily.  30 tablet  6  . [DISCONTINUED] buPROPion (WELLBUTRIN SR) 150 MG 12 hr tablet Take 1 tablet (150 mg total) by mouth 2 (two) times daily.  60 tablet  3  . [DISCONTINUED] simvastatin (ZOCOR) 40 MG tablet Take 40 mg by mouth at bedtime.        No current facility-administered medications on file prior to visit.    Allergies  Allergen Reactions  . Lisinopril Swelling    Angioedema 06/10/11  . Chantix (Varenicline)     insomnia    Past Medical History  Diagnosis Date  . Hyperlipidemia   . Hypertension   . GERD (gastroesophageal reflux disease)   . Leg pain   . Headache   . Carotid artery occlusion   . COPD (chronic obstructive pulmonary disease)   . CAD (coronary artery disease)   . Peripheral vascular disease   .  Tobacco abuse     Past Surgical History  Procedure Laterality Date  . Carotid endarterectomy  11/21/2007    left  . Angioplasty / stenting iliac  2010    right external iliac by Dr. Eldridge Dace  . Coronary angioplasty with stent placement  6/09    LAD 2.5x12 Promus  . Femoral-popliteal bypass graft  10/12    left Dr. Hart Rochester  . Abdominal hysterectomy    . Cardiac catheterization  11/13/11    Left Heart Cath. with Coronary Angiogram    History  Smoking status  . Current Every Day Smoker -- 1.00 packs/day for 35 years  . Types: Cigarettes  Smokeless tobacco  . Never Used    Comment: has not started wellbutrin yet    History  Alcohol Use No    Family History  Problem Relation Age of Onset  . Cancer Mother     BRAIN AND LUNG  . Hypertension Father   . Heart disease  Father     Review of Systems: As noted in history of present illness. She denies any significant claudication symptoms. She's had no recent TIA or CVA symptoms.All other systems were reviewed and are negative.  Physical Exam: BP 142/70  Pulse 88  Ht 6\' 2"  (1.88 m)  Wt 186 lb (84.369 kg)  BMI 23.87 kg/m2 She is a thin black female in no acute distress. She is normocephalic, atraumatic. Pupils are equal round and reactive to light accommodation. Extraocular movements are full. Sclera are clear. Oropharynx is clear. Neck is supple without JVD, adenopathy, or thyromegaly. She has a left carotid bruit. She has an old left CEA scar. Lungs are clear. Cardiac exam reveals a regular rate and rhythm without gallop, murmur, or click. Abdomen is soft and nontender without masses or bruits. She has bilateral femoral bruits with palpable pulses. Her pedal pulses are trace on the left and 1+ on the right. Skin is warm and dry. She is alert and oriented x3. Cranial nerves II through XII are intact. She has no focal motor or sensory deficits. Mood is appropriate.  LABORATORY DATA: Lab Results  Component Value Date   WBC 10.8* 11/06/2011   HGB 12.6 11/13/2011   HCT 37.0 11/13/2011   PLT 179.0 11/06/2011   GLUCOSE 90 07/02/2012   CHOL 163 10/24/2008   TRIG 116 10/24/2008   HDL 43 10/24/2008   LDLDIRECT 185* 09/09/2011   LDLCALC 97 10/24/2008   ALT 8 07/02/2012   AST 11 07/02/2012   NA 139 07/02/2012   K 4.1 07/02/2012   CL 106 07/02/2012   CREATININE 0.74 07/02/2012   BUN 9 07/02/2012   CO2 26 07/02/2012   TSH 0.453 09/09/2011   INR 1.0 11/06/2011     Assessment / Plan: 1. Coronary disease status post stenting of the mid LAD in June of 2009. Cardiac catheterization in August 2013 demonstrated continued patency. No obstructive disease. Chest pain symptoms have resolved with increased carvedilol dose.  2. Palpitations. She does have occasional PVCs. These symptoms are also improved with her increasing carvedilol  dose.  3. Tobacco abuse. I strongly encouraged smoking cessation.  4. Peripheral arterial disease as noted in history of present illness. I recommended increased walking activity.  5. Hypercholesterolemia. On high-dose statin therapy.  I reviewed her medications. She is on all generics. We have no samples for her today. She has applied for an orange card.

## 2012-07-27 ENCOUNTER — Ambulatory Visit: Payer: 59 | Admitting: Neurosurgery

## 2012-07-28 ENCOUNTER — Ambulatory Visit: Payer: 59 | Admitting: Neurosurgery

## 2012-10-10 ENCOUNTER — Encounter (HOSPITAL_COMMUNITY): Payer: Self-pay | Admitting: Emergency Medicine

## 2012-10-10 ENCOUNTER — Emergency Department (HOSPITAL_COMMUNITY): Payer: No Typology Code available for payment source

## 2012-10-10 ENCOUNTER — Emergency Department (HOSPITAL_COMMUNITY)
Admission: EM | Admit: 2012-10-10 | Discharge: 2012-10-10 | Disposition: A | Discharge status: Home / Self Care | Payer: No Typology Code available for payment source | Attending: Emergency Medicine | Admitting: Emergency Medicine

## 2012-10-10 DIAGNOSIS — Z8719 Personal history of other diseases of the digestive system: Secondary | ICD-10-CM | POA: Insufficient documentation

## 2012-10-10 DIAGNOSIS — I2510 Atherosclerotic heart disease of native coronary artery without angina pectoris: Secondary | ICD-10-CM | POA: Insufficient documentation

## 2012-10-10 DIAGNOSIS — M546 Pain in thoracic spine: Secondary | ICD-10-CM | POA: Insufficient documentation

## 2012-10-10 DIAGNOSIS — F17200 Nicotine dependence, unspecified, uncomplicated: Secondary | ICD-10-CM | POA: Insufficient documentation

## 2012-10-10 DIAGNOSIS — R071 Chest pain on breathing: Secondary | ICD-10-CM | POA: Insufficient documentation

## 2012-10-10 DIAGNOSIS — R0602 Shortness of breath: Secondary | ICD-10-CM | POA: Insufficient documentation

## 2012-10-10 DIAGNOSIS — Z8679 Personal history of other diseases of the circulatory system: Secondary | ICD-10-CM | POA: Insufficient documentation

## 2012-10-10 DIAGNOSIS — Z79899 Other long term (current) drug therapy: Secondary | ICD-10-CM | POA: Insufficient documentation

## 2012-10-10 DIAGNOSIS — Z8739 Personal history of other diseases of the musculoskeletal system and connective tissue: Secondary | ICD-10-CM | POA: Insufficient documentation

## 2012-10-10 DIAGNOSIS — Z7982 Long term (current) use of aspirin: Secondary | ICD-10-CM | POA: Insufficient documentation

## 2012-10-10 DIAGNOSIS — R079 Chest pain, unspecified: Secondary | ICD-10-CM

## 2012-10-10 DIAGNOSIS — E785 Hyperlipidemia, unspecified: Secondary | ICD-10-CM | POA: Insufficient documentation

## 2012-10-10 DIAGNOSIS — I10 Essential (primary) hypertension: Secondary | ICD-10-CM | POA: Insufficient documentation

## 2012-10-10 DIAGNOSIS — J4489 Other specified chronic obstructive pulmonary disease: Secondary | ICD-10-CM | POA: Insufficient documentation

## 2012-10-10 DIAGNOSIS — J449 Chronic obstructive pulmonary disease, unspecified: Secondary | ICD-10-CM | POA: Insufficient documentation

## 2012-10-10 LAB — CBC WITH DIFFERENTIAL/PLATELET
Basophils Absolute: 0.1 K/uL (ref 0.0–0.1)
Basophils Relative: 1 % (ref 0–1)
Eosinophils Absolute: 0.2 K/uL (ref 0.0–0.7)
Eosinophils Relative: 1 % (ref 0–5)
HCT: 41.2 % (ref 36.0–46.0)
Hemoglobin: 13.1 g/dL (ref 12.0–15.0)
Lymphocytes Relative: 28 % (ref 12–46)
Lymphs Abs: 3.5 K/uL (ref 0.7–4.0)
MCH: 26.6 pg (ref 26.0–34.0)
MCHC: 31.8 g/dL (ref 30.0–36.0)
MCV: 83.6 fL (ref 78.0–100.0)
Monocytes Absolute: 0.7 K/uL (ref 0.1–1.0)
Monocytes Relative: 6 % (ref 3–12)
Neutro Abs: 8 K/uL — ABNORMAL HIGH (ref 1.7–7.7)
Neutrophils Relative %: 64 % (ref 43–77)
Platelets: 162 K/uL (ref 150–400)
RBC: 4.93 MIL/uL (ref 3.87–5.11)
RDW: 15.9 % — ABNORMAL HIGH (ref 11.5–15.5)
WBC: 12.4 K/uL — ABNORMAL HIGH (ref 4.0–10.5)

## 2012-10-10 LAB — TROPONIN I
Troponin I: <0.3 ng/mL (ref <0.30)
Troponin I: <0.3 ng/mL

## 2012-10-10 LAB — BASIC METABOLIC PANEL
BUN: 9 mg/dL (ref 6–23)
Chloride: 102 mEq/L (ref 96–112)
Creatinine, Ser: 0.96 mg/dL (ref 0.50–1.10)
GFR calc Af Amer: 74 mL/min — ABNORMAL LOW (ref >90)
GFR calc non Af Amer: 63 mL/min — ABNORMAL LOW (ref >90)
Potassium: 3.6 mEq/L (ref 3.5–5.1)

## 2012-10-10 MED ORDER — AMLODIPINE BESYLATE 10 MG PO TABS
10.0000 mg | ORAL_TABLET | Freq: Every day | ORAL | Status: DC
  Administered 2012-10-10: 10 mg via ORAL
  Filled 2012-10-10: qty 1

## 2012-10-10 MED ORDER — AMLODIPINE BESYLATE 10 MG PO TABS: 10.0000 mg | ORAL_TABLET | Freq: Every day | ORAL | Status: DC

## 2012-10-10 MED ORDER — CLOPIDOGREL BISULFATE 75 MG PO TABS: 75.0000 mg | ORAL_TABLET | Freq: Every day | ORAL | Status: DC

## 2012-10-10 MED ORDER — ATORVASTATIN CALCIUM 80 MG PO TABS: 80.0000 mg | ORAL_TABLET | Freq: Every day | ORAL | Status: DC

## 2012-10-10 MED ORDER — CARVEDILOL 3.125 MG PO TABS: 3.1250 mg | ORAL_TABLET | Freq: Two times a day (BID) | ORAL | Status: DC

## 2012-10-10 MED ORDER — CLOPIDOGREL BISULFATE 75 MG PO TABS
75.0000 mg | ORAL_TABLET | Freq: Once | ORAL | Status: AC
  Administered 2012-10-10: 75 mg via ORAL
  Filled 2012-10-10: qty 1

## 2012-10-10 MED ORDER — SODIUM CHLORIDE 0.9 % IV SOLN
Freq: Once | INTRAVENOUS | Status: AC
  Administered 2012-10-10: 18:00:00 via INTRAVENOUS

## 2012-10-10 MED ORDER — POTASSIUM CHLORIDE CRYS ER 20 MEQ PO TBCR: 20.0000 meq | EXTENDED_RELEASE_TABLET | Freq: Two times a day (BID) | ORAL | Status: DC

## 2012-10-10 MED ORDER — CARVEDILOL 3.125 MG PO TABS
3.1250 mg | ORAL_TABLET | Freq: Two times a day (BID) | ORAL | Status: DC
  Administered 2012-10-10: 3.125 mg via ORAL
  Filled 2012-10-10: qty 1

## 2012-10-10 NOTE — ED Notes (Signed)
Pt went to [facility] and checked her blood pressure which was 214/130, pt spoke with pharmacist and stated she was having some back pain since tuesday and should she be concerned, pharmacist told her unless the pain radiated to arm or jaw she should not be worried. [facility] she began having some pain to rt jaw and rt arm so she called EMS. Pt denies any cp just back pain with radiation to rt arm and rt side of jaw. Pt was given 324 ASA and 2 Nitro and pain decreased from 10/10-6/10. Vitals initial pressure was 228/110 decreased to 171/94 after 2nd nitro, 94 pulse, 100 NS. Pt has 22 LAC received NS.

## 2012-10-10 NOTE — ED Provider Notes (Signed)
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 female complaining of chest pain typical of her normal anginal symptoms. Patient has not been taking her nitroglycerin do to issues with her orange card in getting the medications filled. Patient feels much better after receiving nitroglycerin, cardiology consults from Dr. Allyn Kenner appreciated: He states that if the patient has a negative tilt a troponin she is appropriate for discharge. [facility] Up[employer] has consulted the patient's primary care Dr. in the family practice service and nitroglycerin will be routed to the appropriate pharmacy.  Delta troponin is negative. Patient remains chest pain-free. Patient will be given scripts for Norvasc, Lipitor, Coreg, Plavix, and K-Dur  Pt is hemodynamically stable, appropriate for, and amenable to discharge at this time. Pt verbalized understanding and agrees with care plan. Outpatient follow-up and specific return precautions discussed.     Wynetta Emery, PA-C 10/10/12 2117

## 2012-10-10 NOTE — ED Notes (Signed)
Discharge instructions reviewed. Pt verbalized understanding.  

## 2012-10-10 NOTE — ED Provider Notes (Signed)
Medical screening examination/treatment/procedure(s) were performed by non-physician practitioner and as supervising physician I was immediately available for consultation/collaboration.   Gwyneth Sprout, MD 10/10/12 2340

## 2012-10-10 NOTE — ED Notes (Signed)
Pt states she has not taken her blood pressure medications in two weeks.

## 2012-10-10 NOTE — ED Provider Notes (Signed)
History    CSN: [id number] Arrival date & time 10/10/12  1643  First MD Initiated Contact with Patient 10/10/12 1657     Chief Complaint  Patient presents with  . Chest Pain   (Consider location/radiation/quality/duration/timing/severity/associated sxs/prior Treatment) Patient is a 62 y.o. female presenting with chest pain. The history is provided by the patient. No language interpreter was used.  Chest Pain Pain location:  L chest Pain quality: aching   Pain radiates to:  L jaw, upper back and L arm Pain severity:  Moderate Onset quality:  Gradual Relieved by:  Nitroglycerin Worsened by:  Exertion Associated symptoms: back pain and shortness of breath   Associated symptoms: no abdominal pain, no fever, no headache, no nausea, no palpitations and not vomiting   Associated symptoms comment:  For the past several days, she has been having upper back pain. Today, she complains of chest pain radiating to jaw and upper extremities, similar to her previous to her cardiac chest pain. She had SOB. Pain was worsened by exertion. No nausea or vomiting.  Risk factors: coronary artery disease, high cholesterol and hypertension   Risk factors comment:  She has been unable to fill her medications for the past two weeks.   Past Medical History  Diagnosis Date  . Hyperlipidemia   . Hypertension   . GERD (gastroesophageal reflux disease)   . Leg pain   . Headache(784.0)   . Carotid artery occlusion   . COPD (chronic obstructive pulmonary disease)   . CAD (coronary artery disease)   . Peripheral vascular disease   . Tobacco abuse    Past Surgical History  Procedure Laterality Date  . Carotid endarterectomy  11/21/2007    left  . Angioplasty / stenting iliac  2010    right external iliac by Dr. Eldridge Dace  . Coronary angioplasty with stent placement  6/09    LAD 2.5x12 Promus  . Femoral-popliteal bypass graft  10/12    left Dr. Hart Rochester  . Abdominal hysterectomy    . Cardiac  catheterization  11/13/11    Left Heart Cath. with Coronary Angiogram   Family History  Problem Relation Age of Onset  . Cancer Mother     BRAIN AND LUNG  . Hypertension Father   . Heart disease Father    History  Substance Use Topics  . Smoking status: Current Every Day Smoker -- 1.00 packs/day for 35 years    Types: Cigarettes  . Smokeless tobacco: Never Used     Comment: has not started wellbutrin yet  . Alcohol Use: No   OB History   Grav Para Term Preterm Abortions TAB SAB Ect Mult Living                 Review of Systems  Constitutional: Negative for fever.  HENT: Negative for neck pain.   Respiratory: Positive for shortness of breath.   Cardiovascular: Positive for chest pain. Negative for palpitations.  Gastrointestinal: Negative for nausea, vomiting and abdominal pain.  Musculoskeletal: Positive for back pain.  Skin: Negative for color change.  Neurological: Negative for light-headedness and headaches.  Psychiatric/Behavioral: Negative for confusion.    Allergies  Lisinopril and Chantix  Home Medications   Current Outpatient Rx  Name  Route  Sig  Dispense  Refill  . amLODipine (NORVASC) 10 MG tablet   Oral   Take 10 mg by mouth daily.         Marland Kitchen aspirin 325 MG tablet   Oral   Take  325 mg by mouth daily.           . Aspirin-Acetaminophen-Caffeine (GOODY HEADACHE PO)   Oral   Take 1 packet by mouth daily as needed.         Marland Kitchen atorvastatin (LIPITOR) 80 MG tablet   Oral   Take 80 mg by mouth daily.         . carvedilol (COREG) 3.125 MG tablet   Oral   Take 3.125 mg by mouth 2 (two) times daily with a meal.         . clopidogrel (PLAVIX) 75 MG tablet   Oral   Take 1 tablet (75 mg total) by mouth daily.   30 tablet   6   . ibuprofen (ADVIL,MOTRIN) 200 MG tablet   Oral   Take 400 mg by mouth 3 (three) times daily as needed for pain.         . potassium chloride SA (K-DUR,KLOR-CON) 20 MEQ tablet   Oral   Take 1 tablet (20 mEq total)  by mouth 2 (two) times daily.   60 tablet   6    BP 201/79  Pulse 78  Temp(Src) 98.7 F (37.1 C) (Oral)  Resp 15  Ht 6' 2.5" (1.892 m)  Wt 187 lb (84.823 kg)  BMI 23.7 kg/m2  SpO2 100% Physical Exam  Constitutional: She is oriented to person, place, and time. She appears well-developed and well-nourished.  HENT:  Head: Normocephalic.  Neck: Normal range of motion. Neck supple.  Cardiovascular: Normal rate and regular rhythm.   No murmur heard. Pulmonary/Chest: Effort normal and breath sounds normal.  Abdominal: Soft. Bowel sounds are normal. There is no tenderness. There is no rebound and no guarding.  Musculoskeletal: Normal range of motion.  Neurological: She is alert and oriented to person, place, and time.  Skin: Skin is warm and dry. No rash noted.  Psychiatric: She has a normal mood and affect.    ED Course  Procedures (including critical care time) Labs Reviewed  TROPONIN I  BASIC METABOLIC PANEL  CBC WITH DIFFERENTIAL   Results for orders placed during the hospital encounter of 10/10/12  TROPONIN I      Result Value Range   Troponin I <0.30  <0.30 ng/mL  BASIC METABOLIC PANEL      Result Value Range   Sodium 138  135 - 145 mEq/L   Potassium 3.6  3.5 - 5.1 mEq/L   Chloride 102  96 - 112 mEq/L   CO2 26  19 - 32 mEq/L   Glucose, Bld 141 (*) 70 - 99 mg/dL   BUN 9  6 - 23 mg/dL   Creatinine, Ser 8.41  0.50 - 1.10 mg/dL   Calcium 9.6  8.4 - 32.4 mg/dL   GFR calc non Af Amer 63 (*) >90 mL/min   GFR calc Af Amer 74 (*) >90 mL/min  CBC WITH DIFFERENTIAL      Result Value Range   WBC 12.4 (*) 4.0 - 10.5 K/uL   RBC 4.93  3.87 - 5.11 MIL/uL   Hemoglobin 13.1  12.0 - 15.0 g/dL   HCT 40.1  02.7 - 25.3 %   MCV 83.6  78.0 - 100.0 fL   MCH 26.6  26.0 - 34.0 pg   MCHC 31.8  30.0 - 36.0 g/dL   RDW 66.4 (*) 40.3 - 47.4 %   Platelets 162  150 - 400 K/uL   Neutrophils Relative % 64  43 - 77 %  Neutro Abs 8.0 (*) 1.7 - 7.7 K/uL   Lymphocytes Relative 28  12 - 46 %    Lymphs Abs 3.5  0.7 - 4.0 K/uL   Monocytes Relative 6  3 - 12 %   Monocytes Absolute 0.7  0.1 - 1.0 K/uL   Eosinophils Relative 1  0 - 5 %   Eosinophils Absolute 0.2  0.0 - 0.7 K/uL   Basophils Relative 1  0 - 1 %   Basophils Absolute 0.1  0.0 - 0.1 K/uL   Dg Chest Portable 1 View  10/10/2012   *RADIOLOGY REPORT*  Clinical Data: Shortness of breath.  PORTABLE CHEST - 1 VIEW  Comparison: 01/16/2011  Findings: The cardiac silhouette is normal in size and configuration.  The mediastinum is normal in contour caliber.  The lungs are clear.  No pleural effusion or pneumothorax.  The bony thorax is demineralized but intact.  IMPRESSION: No acute cardiopulmonary disease.   Original Report Authenticated By: Amie Portland, M.D.   Date: 10/10/2012  Rate: 77  Rhythm: normal sinus rhythm  QRS Axis: normal  Intervals: normal  ST/T Wave abnormalities: nonspecific ST changes  Conduction Disutrbances:none  Narrative Interpretation:   Old EKG Reviewed: unchanged   No results found. No diagnosis found.  MDM  She is pain free here after NTG given by EMS. She reports she has not been taking her regular medications secondary to a change in pharmacy (she now has an "orange card" and her medications [facility] through the [facility]). Chest pain is typical of patient's recurrent pain, better with NTG and she remains pain free here, c/w stable angina. Discussed with Dr. Antoine Poche who advises that if labs are unremarkable to suggest ACS, patient can be discharged home to follow up in the outpatient setting. Patient care transferred to [facility] with second troponin pending.   Arnoldo Hooker, PA-C 10/10/12 1953

## 2012-10-11 NOTE — ED Provider Notes (Signed)
History/physical exam/procedure(s) were performed by non-physician practitioner and as supervising physician I was immediately available for consultation/collaboration. I have reviewed all notes and am in agreement with care and plan.   Hilario Quarry, MD 10/11/12 [phone]

## 2012-10-14 ENCOUNTER — Other Ambulatory Visit: Payer: Self-pay | Admitting: Family Medicine

## 2012-10-16 ENCOUNTER — Encounter: Payer: Self-pay | Admitting: Family Medicine

## 2012-10-16 ENCOUNTER — Ambulatory Visit (INDEPENDENT_AMBULATORY_CARE_PROVIDER_SITE_OTHER): Payer: No Typology Code available for payment source | Admitting: Family Medicine

## 2012-10-16 VITALS — BP 170/76 | HR 65 | Ht 74.5 in | Wt 183.0 lb

## 2012-10-16 DIAGNOSIS — I10 Essential (primary) hypertension: Secondary | ICD-10-CM

## 2012-10-16 DIAGNOSIS — R079 Chest pain, unspecified: Secondary | ICD-10-CM

## 2012-10-16 DIAGNOSIS — M545 Low back pain: Secondary | ICD-10-CM

## 2012-10-16 DIAGNOSIS — R6884 Jaw pain: Secondary | ICD-10-CM

## 2012-10-16 MED ORDER — TRAZODONE HCL 50 MG PO TABS: 25.0000 mg | ORAL_TABLET | Freq: Every evening | ORAL | Status: DC | PRN

## 2012-10-16 MED ORDER — AMLODIPINE BESYLATE 10 MG PO TABS: 10.0000 mg | ORAL_TABLET | Freq: Every day | ORAL | Status: DC

## 2012-10-16 MED ORDER — CARVEDILOL 6.25 MG PO TABS: 6.2500 mg | ORAL_TABLET | Freq: Every morning | ORAL | Status: DC

## 2012-10-16 MED ORDER — CARVEDILOL 3.125 MG PO TABS: 3.1250 mg | ORAL_TABLET | Freq: Every evening | ORAL | Status: DC

## 2012-10-16 MED ORDER — ATORVASTATIN CALCIUM 80 MG PO TABS: 80.0000 mg | ORAL_TABLET | Freq: Every day | ORAL | Status: DC

## 2012-10-16 MED ORDER — TRAMADOL HCL 50 MG PO TABS: 50.0000 mg | ORAL_TABLET | Freq: Three times a day (TID) | ORAL | Status: DC | PRN

## 2012-10-16 MED ORDER — CYCLOBENZAPRINE HCL 10 MG PO TABS: 10.0000 mg | ORAL_TABLET | Freq: Three times a day (TID) | ORAL | Status: DC | PRN

## 2012-10-16 MED ORDER — CLOPIDOGREL BISULFATE 75 MG PO TABS: 75.0000 mg | ORAL_TABLET | Freq: Every day | ORAL | Status: DC

## 2012-10-16 NOTE — Progress Notes (Signed)
Patient ID: [name] [facility]    DOB: 10/17/1950, 62 y.o.   MRN: [id number] --- Subjective:  Leah [name] is a 62 y.o.female who presents for follow up on recent ED visit on 7/19 for chest pain and hypertension.   - chest pain: started on Saturday. First started with pain around her right shoulder. The pain then migrated to her chest at which point she went to the ED to be evaluated. She had negative troponins and no change in EKG. She was given asa and nitro with some relief of symptoms. She has not had anymore chest pain since she was discharged from the ED.   - hypertension: BP was elevated in the ED in the 220's/100's. Since then, she has filled her medication and is taking: amlodipine 10mg  daily, carvedilol 3.125mg  bid. She denies any shortness of breath, any lower extremity swelling.   - jaw pain: located on both sides below jaw. Had pain when she went to the ED and now it feels like a "dull annoyance". No pain with chewing. No rhinorrhea or nasal congestion. No headache.   - low back pain: chronic, worst with house work and bending. Better with rest and laying down. Feeling of stiffness and discomfort first thing in the morning after waking up. Better after initial movement. No weakness in lower extremities, no bowel or urinary incontinence.   ROS: see HPI Past Medical History: reviewed and updated medications and allergies. Social History: Tobacco: 1/2 pack per day. She is happy with the progress she has made in cutting back.   Objective: Filed Vitals:   10/16/12 0842  BP: 170/76  Pulse: 65    Physical Examination:   General appearance - alert, well appearing, and in no distress Mouth - mucous membranes moist, pharynx normal without lesions Neck - supple, no significant adenopathy, some discomfort with palpation of mandible at level of TMJ on left.  Chest - clear to auscultation, no wheezes, rales or rhonchi, symmetric air entry Heart - normal rate, regular rhythm, normal S1, S2, no  murmurs, rubs, clicks or gallops Extremities - no lower extremity swelling MSK - no tenderness along lumbar spine, no tenderness along paraspinal muscles, 5/5 strength with knee flexion and extension bilaterally.

## 2012-10-16 NOTE — Patient Instructions (Addendum)
Please follow up with me in 1 month and see the cardiologist in between.  We are increasing your coreg to 6.25mg  in the morning. Continue with 3.125mg  in the evening.  Take your blood pressure, 4-6 times per week and bring me back the log.

## 2012-10-18 DIAGNOSIS — R6884 Jaw pain: Secondary | ICD-10-CM | POA: Insufficient documentation

## 2012-10-18 NOTE — Assessment & Plan Note (Signed)
Likely from osteoarthritis changes. No red flags on exam or history such as weakness, point tenderness along spine, weight loss or incontinence. Will treat with flexeril and tramadol.

## 2012-10-18 NOTE — Assessment & Plan Note (Addendum)
Chest pain in setting of CAD and stent placement. Patient had not been compliant with BP meds as well as plavix. Resolved since ED visit. Patient has taken her medicine since then. Patient to make follow up appointment with her cardiologist.  Rx given for plavix to obtain at [facility].

## 2012-10-18 NOTE — Assessment & Plan Note (Signed)
Doesn't appear to be TMJ or otitis media radiating to the jaw.  Patient had carotid duplex done in September 2013 with stable disease. If symptoms persist, consider repeat duplex sooner than September.

## 2012-10-18 NOTE — Assessment & Plan Note (Signed)
Improved compared to SBP of 220 in ED 5 days ago, but still elevated.  Will increase coreg to 6.25mg  in am and keep 3.125mg  in pm for now (given that baseline HR is 65).  Follow up in 1 month. Rx printed out for coreg and norvasc so that she can get medicine at [facility].

## 2012-11-17 ENCOUNTER — Ambulatory Visit: Payer: No Typology Code available for payment source | Admitting: Family Medicine

## 2012-11-20 ENCOUNTER — Telehealth: Payer: Self-pay | Admitting: Cardiology

## 2012-11-20 NOTE — Telephone Encounter (Signed)
Changes by her primary care for treatment of her BP are appropriate. I think her Nov appt. Is OK.  Peter Swaziland MD, [facility]

## 2012-11-20 NOTE — Telephone Encounter (Signed)
New Problem  Pt has had an episode recently that sent her to the ER// Primary care increased BP meds and wants to know if it will interfere w/ the heart medication. Scheduled appt per recall letter on 11/24. Does pt needs a sooner appt. Please advise.

## 2012-11-20 NOTE — Telephone Encounter (Signed)
Spoke with patient who called to inquire as to whether she needs a sooner appointment.  Patient was in ED on 7/19 for elevated BP.  states BP is currently 140/80.  Patient states she is feeling well and denies complaints presently.  I advised patient that I will send message to Anabel Halon, LPN Dr. Elvis Coil primary nurse and Dr. Swaziland for advice regarding whether or not she needs to be seen sooner.  Patient verbalized understanding and agreement with plan of care.

## 2012-11-24 NOTE — Telephone Encounter (Signed)
Returned call to patient Dr.Jordan advised changes by her primary care for B/P treatment are appropriate.Advised to keep appointment in 11/14 and call earlier if needed.

## 2012-11-30 ENCOUNTER — Encounter: Payer: Self-pay | Admitting: Vascular Surgery

## 2012-12-01 ENCOUNTER — Other Ambulatory Visit: Payer: Self-pay

## 2012-12-01 ENCOUNTER — Ambulatory Visit: Payer: 59 | Admitting: Neurosurgery

## 2012-12-01 ENCOUNTER — Other Ambulatory Visit: Payer: 59

## 2012-12-01 ENCOUNTER — Ambulatory Visit: Payer: Self-pay | Admitting: Vascular Surgery

## 2013-01-11 ENCOUNTER — Encounter: Payer: Self-pay | Admitting: Vascular Surgery

## 2013-01-12 ENCOUNTER — Inpatient Hospital Stay (HOSPITAL_COMMUNITY): Admission: RE | Admit: 2013-01-12 | Payer: Self-pay | Source: Ambulatory Visit

## 2013-01-12 ENCOUNTER — Ambulatory Visit: Payer: Self-pay | Admitting: Vascular Surgery

## 2013-01-25 ENCOUNTER — Ambulatory Visit: Payer: 59 | Admitting: Neurosurgery

## 2013-01-25 ENCOUNTER — Other Ambulatory Visit: Payer: 59

## 2013-02-15 ENCOUNTER — Ambulatory Visit: Payer: Self-pay | Admitting: Cardiology

## 2013-03-09 ENCOUNTER — Encounter: Payer: Self-pay | Admitting: Cardiology

## 2013-04-22 ENCOUNTER — Ambulatory Visit: Payer: Self-pay | Admitting: Cardiology

## 2013-04-28 ENCOUNTER — Other Ambulatory Visit: Payer: Self-pay | Admitting: *Deleted

## 2013-04-29 ENCOUNTER — Telehealth: Payer: Self-pay | Admitting: Family Medicine

## 2013-04-29 MED ORDER — TRAMADOL HCL 50 MG PO TABS: 50.0000 mg | ORAL_TABLET | Freq: Three times a day (TID) | ORAL | Status: DC | PRN

## 2013-04-29 NOTE — Telephone Encounter (Signed)
Please let patient know that she needs to come to the clinic to pick up her tramadol prescription and that she will need to be seen in the office for her next refill since this is a controled substance. Thank you!  Liam Graham, PGY-3 Family Medicine Resident

## 2013-04-29 NOTE — Telephone Encounter (Signed)
Pt called and needs a refill on her Tramadol called in to the health dept. Blima Rich

## 2013-04-29 NOTE — Telephone Encounter (Signed)
Called pt. Informed. Pt agreed. Javier Glazier, Gerrit Heck

## 2013-05-03 ENCOUNTER — Encounter: Payer: Self-pay | Admitting: Family Medicine

## 2013-05-03 ENCOUNTER — Ambulatory Visit (INDEPENDENT_AMBULATORY_CARE_PROVIDER_SITE_OTHER): Payer: Self-pay | Admitting: Family Medicine

## 2013-05-03 VITALS — BP 161/88 | HR 76 | Temp 97.7°F | Ht 74.5 in | Wt 191.0 lb

## 2013-05-03 DIAGNOSIS — I10 Essential (primary) hypertension: Secondary | ICD-10-CM

## 2013-05-03 DIAGNOSIS — E785 Hyperlipidemia, unspecified: Secondary | ICD-10-CM

## 2013-05-03 DIAGNOSIS — F17200 Nicotine dependence, unspecified, uncomplicated: Secondary | ICD-10-CM

## 2013-05-03 DIAGNOSIS — I2510 Atherosclerotic heart disease of native coronary artery without angina pectoris: Secondary | ICD-10-CM

## 2013-05-03 MED ORDER — NICOTINE 14 MG/24HR TD PT24: 14.0000 mg | MEDICATED_PATCH | Freq: Every day | TRANSDERMAL | Status: DC

## 2013-05-03 MED ORDER — CARVEDILOL 6.25 MG PO TABS: 6.2500 mg | ORAL_TABLET | Freq: Two times a day (BID) | ORAL | Status: DC

## 2013-05-03 NOTE — Assessment & Plan Note (Addendum)
Patient to make appointments to see her cardiologist in the next week or 2. She denies any chest pain but has had some increased shortness of breath. Last stress test found in chart was done on 06/20/2009, was a low risk scan and had an EF of 65%.Will hold on 2-D echo order until she is seen by cardiology for further evaluation.

## 2013-05-03 NOTE — Assessment & Plan Note (Addendum)
Last lipid panel from 2010. Patient's to return to clinic fasting for lipid panel. Currently on Lipitor 80 mg daily.

## 2013-05-03 NOTE — Patient Instructions (Signed)
Follow up with me in 3-4 weeks.   Please return to the clinic for a lab appointment only. You can make the appointment by calling a couple of days prior to the day you would like to get your labs drawn.  Please come fasting for the labwork: no food or drink other than water after midnight.

## 2013-05-03 NOTE — Assessment & Plan Note (Addendum)
Blood pressure not at goal. Patient does report compliance. Will increase her Corag to 6.25 mg twice a day from 6.25 in a.m. and 3.125 in p.m. Patient to followup with her cardiologist to followup for CAD, ideally in the next couple weeks. Patient to followup with me in 3-4 weeks.

## 2013-05-03 NOTE — Assessment & Plan Note (Signed)
Trabeculated patient on her wanting to quit smoking. Sent Rx for nicotine patches. Gave patient information about the [place] quit line

## 2013-05-03 NOTE — Progress Notes (Signed)
Patient ID: Leah [name] [facility]    DOB: January 28, 1951, 63 y.o.   MRN: [id number] --- Subjective:  Aloni is a 63 y.o.female with history of peripheral vascular disease, CAD, hyperlipidemia, hypertension, tobacco abuse who presents for follow up on hypertension.  # Hypertension: Medications: amlodipine 10mg  and coreg 6.25mg  in am and 3.125mg  at night.  Number of doses missed in 1 week: 0 BP at home: checks at pharmacy: [id number] systolic Exercise routine: Walks up and down stairs in the house walks to the mailbox. Otherwise no exercise routine Chest pain: None Lower extremity swelling: None Shortness of breath: This with climbing stairs. Has been ongoing for last 1-2 weeks. Not associated chest pain. resolves with rest. She reports 2 pillow orthopnea no lower surgery swelling, no dizziness, no palpitations, no lightheadedness. Headache: None Change in vision: None   ROS: see HPI Past Medical History: reviewed and updated medications and allergies. Social History: Tobacco: Current every day smoker half pack a day. Has been a smoker for over 30 years. She is interested in quitting. She is interested in nicotine patches. She says that when she was hospitalized at various times they had helped. She has never tried quitting smoking  Objective: Filed Vitals:   05/03/13 1011  BP: 161/88  Pulse: 76  Temp: 97.7 F (36.5 C)   Repeat blood pressure: 152/80 Physical Examination:   General appearance - alert, well appearing, and in no distress Neck - supple, no significant adenopathy Chest - clear to auscultation, no wheezes, rales or rhonchi, symmetric air entry Heart - normal rate, regular rhythm, normal S1, S2, soft 2/6 systolic murmur best heard at right sternal border Extremities - dorsalis pedis pulses difficult to palpate, radial pulses present bilaterally. No pedal edema

## 2013-05-10 ENCOUNTER — Other Ambulatory Visit: Payer: Self-pay

## 2013-06-22 ENCOUNTER — Other Ambulatory Visit: Payer: Self-pay | Admitting: Family Medicine

## 2013-06-22 ENCOUNTER — Other Ambulatory Visit: Payer: Self-pay | Admitting: *Deleted

## 2013-06-22 MED ORDER — CYCLOBENZAPRINE HCL 10 MG PO TABS: 10.0000 mg | ORAL_TABLET | Freq: Three times a day (TID) | ORAL | Status: DC | PRN

## 2013-06-23 ENCOUNTER — Other Ambulatory Visit: Payer: Self-pay | Admitting: *Deleted

## 2013-06-24 MED ORDER — CYCLOBENZAPRINE HCL 10 MG PO TABS: 10.0000 mg | ORAL_TABLET | Freq: Three times a day (TID) | ORAL | Status: DC | PRN

## 2013-06-24 MED ORDER — TRAMADOL HCL 50 MG PO TABS: 50.0000 mg | ORAL_TABLET | Freq: Three times a day (TID) | ORAL | Status: DC | PRN

## 2013-06-24 NOTE — Telephone Encounter (Signed)
Called in tramadol prescription to [facility].   Liam Graham, PGY-3 Family Medicine Resident

## 2013-06-24 NOTE — Telephone Encounter (Signed)
Also faxed Rx for good measure.

## 2013-06-28 ENCOUNTER — Other Ambulatory Visit: Payer: Self-pay | Admitting: *Deleted

## 2013-06-28 MED ORDER — CLOPIDOGREL BISULFATE 75 MG PO TABS: 75.0000 mg | ORAL_TABLET | Freq: Every day | ORAL | Status: DC

## 2013-06-29 ENCOUNTER — Other Ambulatory Visit: Payer: Self-pay | Admitting: Family Medicine

## 2013-06-29 MED ORDER — CLOPIDOGREL BISULFATE 75 MG PO TABS: 75.0000 mg | ORAL_TABLET | Freq: Every day | ORAL | Status: DC

## 2013-07-20 ENCOUNTER — Other Ambulatory Visit: Payer: Self-pay | Admitting: *Deleted

## 2013-07-20 MED ORDER — TRAZODONE HCL 50 MG PO TABS: 25.0000 mg | ORAL_TABLET | Freq: Every evening | ORAL | Status: DC | PRN

## 2013-08-23 ENCOUNTER — Other Ambulatory Visit: Payer: Self-pay | Admitting: Family Medicine

## 2013-08-26 ENCOUNTER — Other Ambulatory Visit: Payer: Self-pay | Admitting: *Deleted

## 2013-08-30 MED ORDER — TRAMADOL HCL 50 MG PO TABS: 50.0000 mg | ORAL_TABLET | Freq: Three times a day (TID) | ORAL | Status: DC | PRN

## 2013-08-30 NOTE — Telephone Encounter (Signed)
Called and informed patient that her Rx is at the front desk ready for her to pick up.Jaymes Graff Roselyne Stalnaker

## 2013-08-30 NOTE — Telephone Encounter (Signed)
Please let patient know that tramadol Rx is ready to pick up. Thank you!  Liam Graham, PGY-3 Family Medicine Resident

## 2013-08-30 NOTE — Telephone Encounter (Signed)
Pt called and needs a refill on her Tramadol left up front. jw

## 2013-09-22 ENCOUNTER — Other Ambulatory Visit: Payer: Self-pay | Admitting: Family Medicine

## 2013-11-01 ENCOUNTER — Other Ambulatory Visit: Payer: Self-pay | Admitting: *Deleted

## 2013-11-01 MED ORDER — CYCLOBENZAPRINE HCL 10 MG PO TABS: ORAL_TABLET | ORAL | Status: DC

## 2013-11-01 NOTE — Telephone Encounter (Signed)
Please let Ms. Las Flores know that I refilled her Rx for flexeril for 1 month, however she needs to follow up in clinic as she hasn't been seen in 6 months and I have never met her.  Thanks, Archie Patten, MD

## 2013-11-04 NOTE — Telephone Encounter (Signed)
Pt informed RX was called in. Pt will call back and set up appt to meet new MD. Delray Alt CMA

## 2013-12-01 ENCOUNTER — Ambulatory Visit: Payer: Self-pay | Admitting: Family Medicine

## 2013-12-21 ENCOUNTER — Telehealth: Payer: Self-pay | Admitting: *Deleted

## 2013-12-21 NOTE — Telephone Encounter (Signed)
Pt asking for refills on meds, tried making an appt with MD to meet her but there is no schedule until November. Pt goes to [facility] [employer]

## 2013-12-21 NOTE — Telephone Encounter (Signed)
Patient asking for refill on plavix and tramadol.

## 2013-12-22 MED ORDER — TRAMADOL HCL 50 MG PO TABS: 50.0000 mg | ORAL_TABLET | Freq: Three times a day (TID) | ORAL | Status: DC | PRN

## 2013-12-22 MED ORDER — CLOPIDOGREL BISULFATE 75 MG PO TABS: 75.0000 mg | ORAL_TABLET | Freq: Every day | ORAL | Status: DC

## 2013-12-22 MED ORDER — ATORVASTATIN CALCIUM 80 MG PO TABS: 80.0000 mg | ORAL_TABLET | Freq: Every day | ORAL | Status: DC

## 2013-12-22 NOTE — Addendum Note (Signed)
Addended by: Archie Patten on: 12/22/2013 05:40 PM   Modules accepted: Orders

## 2013-12-22 NOTE — Telephone Encounter (Signed)
Please let the patient know that I sent the Rx for Plavix to her pharmacy and that she can pick up her Rx for Tramadol at the front desk. Additionally, please have her make an appointment with me in the next 1-2 months.  Thanks, Archie Patten, MD [facility] Family Medicine Resident

## 2013-12-22 NOTE — Telephone Encounter (Signed)
Liptor Rx printed out and up front.

## 2013-12-22 NOTE — Telephone Encounter (Signed)
Pt informed. She also states that she needs her cholesterol rx filled as well. Ahron Hulbert CMA

## 2013-12-28 ENCOUNTER — Other Ambulatory Visit: Payer: Self-pay | Admitting: *Deleted

## 2013-12-28 MED ORDER — CARVEDILOL 6.25 MG PO TABS: 6.2500 mg | ORAL_TABLET | Freq: Two times a day (BID) | ORAL | Status: DC

## 2013-12-28 NOTE — Telephone Encounter (Signed)
Please let the patient know that I refilled her Rx for Coreg.  Thanks, Archie Patten, MD [facility] Family Medicine Resident

## 2014-01-28 ENCOUNTER — Other Ambulatory Visit: Payer: Self-pay | Admitting: *Deleted

## 2014-01-28 MED ORDER — AMLODIPINE BESYLATE 10 MG PO TABS: 10.0000 mg | ORAL_TABLET | Freq: Every day | ORAL | Status: DC

## 2014-02-01 ENCOUNTER — Ambulatory Visit (INDEPENDENT_AMBULATORY_CARE_PROVIDER_SITE_OTHER): Payer: Self-pay | Admitting: Family Medicine

## 2014-02-01 ENCOUNTER — Encounter: Payer: Self-pay | Admitting: Family Medicine

## 2014-02-01 VITALS — BP 151/77 | HR 90 | Temp 97.9°F | Ht 75.0 in | Wt 185.0 lb

## 2014-02-01 DIAGNOSIS — Z0000 Encounter for general adult medical examination without abnormal findings: Secondary | ICD-10-CM

## 2014-02-01 DIAGNOSIS — M545 Low back pain, unspecified: Secondary | ICD-10-CM

## 2014-02-01 DIAGNOSIS — R208 Other disturbances of skin sensation: Secondary | ICD-10-CM

## 2014-02-01 DIAGNOSIS — I2510 Atherosclerotic heart disease of native coronary artery without angina pectoris: Secondary | ICD-10-CM

## 2014-02-01 DIAGNOSIS — G4700 Insomnia, unspecified: Secondary | ICD-10-CM

## 2014-02-01 DIAGNOSIS — I10 Essential (primary) hypertension: Secondary | ICD-10-CM

## 2014-02-01 MED ORDER — CYCLOBENZAPRINE HCL 10 MG PO TABS: ORAL_TABLET | ORAL | Status: DC

## 2014-02-01 MED ORDER — TRAZODONE HCL 100 MG PO TABS
100.0000 mg | ORAL_TABLET | Freq: Every evening | ORAL | Status: DC | PRN
Start: 2014-02-01 — End: 2014-02-24

## 2014-02-01 MED ORDER — TRAMADOL HCL 50 MG PO TABS: 50.0000 mg | ORAL_TABLET | Freq: Three times a day (TID) | ORAL | Status: DC | PRN

## 2014-02-01 NOTE — Assessment & Plan Note (Signed)
Given patient's h/o polyuria, there was an initial concern for a new diagnosis of DM, however she only has the symptoms at night. It does seem consistent with restless leg syndrome. Patient with a h/o anemia in the past, however last Hb normal at 13. In the future, will need to evaluate for possible causes: thyroid dysfunction, LFTs, and iron deficiency once she is more financially stable. - Discussed options with pt, will start iron supplement and a multivitamin  - Will defer laboratory evaluation in the future

## 2014-02-01 NOTE — Progress Notes (Signed)
[facility]  Archie Patten, MD  Subjective:  Chief complaint: Meet new doctor  Patient presents with a PMHx of CAD (s/p stent x 1), PVD (s/p stent x1), HTN, tobacco abuse, insomnia, lower back pain and HLD and a complaint of burning in her feet.  Burning in feet: Patient notes this has been occuring for the last several months. It does not happen every day. Sometimes it will occur 2x/wk, other times it occurs nightly. The burning in her feet and ankles only occurs at night. Denies any numbness or tingling. Does endorse polyuria, denies dysuria or polydipsia.   Insomnia: Patient notes she hasn't been sleeping well for 3-74months. Notes she does have more stress in her life due to her fathers illness. Very frustrated as she's tired, but once she lays down she can't fall asleep. Has been taking trazodone 50mg  nightly which was helping up until the last few months. She will eventually fall asleep for a couple of hours, however will wake up and have a difficult time falling back asleep. Denies change in eating habits or concentration.   PHQ-9 score: 8, somewhat difficult  HTN: did not take amlodpine in the last few days. No hypotension or chest pain. Has not missed doses of her coreg. No headaches or vision change.  Lower back pain: Pain well controlled on flexeril and tramadol. Pain in the lumbar region, constant and aching, worse with movement. No radicular pain. No urinary or bowel incontinence.  Health maintenance: Patient due for colonoscopy (states its been around 69yrs since last colonoscopy with a family history).  Also due for mammogram, flu vaccine, and Zostavax. Does not have insurance or orange card currently, therefore will defer this for now  ROS- No change in bowel habits   Past Medical History Patient Active Problem List   Diagnosis Date Noted  . Burning sensation of feet 02/01/2014  . Jaw pain 10/18/2012  . Low back pain 07/05/2012  . Insomnia 07/05/2012  .  Peripheral vascular disease, unspecified 10/25/2011  . PVC (premature ventricular contraction) 10/02/2011  . Fatigue 09/04/2011  . Chest pain 09/04/2011  . History of angioedema 06/14/2011  . Atherosclerosis of native arteries of the extremities with intermittent claudication 06/04/2011  . TOBACCO ABUSE 06/12/2009  . HYPERLIPIDEMIA 09/28/2008  . Essential hypertension, benign 09/28/2008  . Coronary atherosclerosis of native coronary artery 09/28/2008    Medications- reviewed and updated Current Outpatient Prescriptions  Medication Sig Dispense Refill  . aspirin 325 MG tablet Take 325 mg by mouth daily.      Marland Kitchen atorvastatin (LIPITOR) 80 MG tablet Take 1 tablet (80 mg total) by mouth daily. 30 tablet 1  . carvedilol (COREG) 6.25 MG tablet Take 1 tablet (6.25 mg total) by mouth 2 (two) times daily with a meal. 30 tablet 2  . clopidogrel (PLAVIX) 75 MG tablet Take 1 tablet (75 mg total) by mouth daily. 30 tablet 1  . ibuprofen (ADVIL,MOTRIN) 200 MG tablet Take 400 mg by mouth 3 (three) times daily as needed for pain.    Marland Kitchen amLODipine (NORVASC) 10 MG tablet Take 1 tablet (10 mg total) by mouth daily. 30 tablet 1  . cyclobenzaprine (FLEXERIL) 10 MG tablet TAKE ONE TABLET BY MOUTH THREE TIMES DAILY AS NEEDED FOR MUSCLE SPASMS 60 tablet 0  . potassium chloride SA (K-DUR,KLOR-CON) 20 MEQ tablet Take 1 tablet (20 mEq total) by mouth 2 (two) times daily. 14 tablet 0  . traMADol (ULTRAM) 50 MG tablet Take 1 tablet (50 mg total)  by mouth every 8 (eight) hours as needed. 60 tablet 0  . traZODone (DESYREL) 100 MG tablet Take 1 tablet (100 mg total) by mouth at bedtime as needed for sleep. 30 tablet 0  . [DISCONTINUED] buPROPion (WELLBUTRIN SR) 150 MG 12 hr tablet Take 1 tablet (150 mg total) by mouth 2 (two) times daily. 60 tablet 3  . [DISCONTINUED] simvastatin (ZOCOR) 40 MG tablet Take 40 mg by mouth at bedtime.      No current facility-administered medications for this visit.    Objective: BP  151/77 mmHg  Pulse 90  Temp(Src) 97.9 F (36.6 C) (Oral)  Ht 6\' 3"  (1.905 m)  Wt 185 lb (83.915 kg)  BMI 23.12 kg/m2 Gen: No acute distress. Alert, cooperative with exam HEENT: Atraumatic, EOMI, PERRLA, Oropharynx clear. MMM CV: RRR. II/VI systolic murmur hear best at the right sternal border. No rubs or gallops noted. 1+ radial pulses bilaterally, difficult to palpate DP pulses Resp: CTAB. No wheezing, crackles, or rhonchi noted. Abd: +BS. Soft, non-distended, non-tender. No rebound or guarding.  Ext: No edema. No gross deformities. Sensation intact to monofilament exam.  Neuro: Alert and oriented, No gross focal deficits. No pain over the spinal processes, no drop-offs. Pain with palpation over the paraspinal muscles bilaterally.   Assessment/Plan:  Essential hypertension, benign BP currently not at goal, however she has not taken amlodipine in a few days. - Refilled amlodipine Rx a few days ago, pt advised she could pick this up at her pharmacy  - Continue coreg 6.25mg  BID - Continue to monitor  Coronary atherosclerosis of native coronary artery Patient has not seen cardiology in >1 year. No chest pain.  - F/u with cardiology  - Continue Plavix  - Continue ASA -Continue statin - Discussed smoking cessation   HYPERLIPIDEMIA Last lipid profile was in 2013 and revealed an LDL of 185.  - Continue Lipitor 80mg  daily - Once patient has seen Pamala Hurry to discuss insurance/Orange card, will check lipid panel and LFTs  Insomnia Could be secondary to stress. Patient does not feel it is secondary to polyuria. Discussed sleep hygiene  - Increased trazodone to 100mg  qHS  Low back pain Stable. No red flags on exam or in history  - Refilled flexeril and tramadol   Burning sensation of feet Given patient's h/o polyuria, there was an initial concern for a new diagnosis of DM, however she only has the symptoms at night. It does seem consistent with restless leg syndrome. Patient with a  h/o anemia in the past, however last Hb normal at 13. In the future, will need to evaluate for possible causes: thyroid dysfunction, LFTs, and iron deficiency once she is more financially stable. - Discussed options with pt, will start iron supplement and a multivitamin  - Will defer laboratory evaluation in the future    No orders of the defined types were placed in this encounter.    Meds ordered this encounter  Medications  . traMADol (ULTRAM) 50 MG tablet    Sig: Take 1 tablet (50 mg total) by mouth every 8 (eight) hours as needed.    Dispense:  60 tablet    Refill:  0  . cyclobenzaprine (FLEXERIL) 10 MG tablet    Sig: TAKE ONE TABLET BY MOUTH THREE TIMES DAILY AS NEEDED FOR MUSCLE SPASMS    Dispense:  60 tablet    Refill:  0  . traZODone (DESYREL) 100 MG tablet    Sig: Take 1 tablet (100 mg total) by mouth at bedtime  as needed for sleep.    Dispense:  30 tablet    Refill:  0

## 2014-02-01 NOTE — Assessment & Plan Note (Signed)
Last lipid profile was in 2013 and revealed an LDL of 185.  - Continue Lipitor 80mg  daily - Once patient has seen Pamala Hurry to discuss insurance/Orange card, will check lipid panel and LFTs

## 2014-02-01 NOTE — Assessment & Plan Note (Signed)
Could be secondary to stress. Patient does not feel it is secondary to polyuria. Discussed sleep hygiene  - Increased trazodone to 100mg  qHS

## 2014-02-01 NOTE — Patient Instructions (Signed)
Please continue to take your medications as prescribed Please follow up with cardiology  Start taking an iron supplement and a multivitamin for the feet burning (these do not need a prescription). Please ask to make an appointment with Pamala Hurry when you leave to discuss insurance Please make an appointment with me once your insurance status has improved so we can do blood tests and the flu shot.

## 2014-02-01 NOTE — Assessment & Plan Note (Signed)
Stable. No red flags on exam or in history  - Refilled flexeril and tramadol

## 2014-02-01 NOTE — Assessment & Plan Note (Addendum)
Patient has not seen cardiology in >1 year. No chest pain.  - F/u with cardiology  - Continue Plavix  - Continue ASA -Continue statin - Discussed smoking cessation

## 2014-02-01 NOTE — Assessment & Plan Note (Signed)
BP currently not at goal, however she has not taken amlodipine in a few days. - Refilled amlodipine Rx a few days ago, pt advised she could pick this up at her pharmacy  - Continue coreg 6.25mg  BID - Continue to monitor

## 2014-02-02 NOTE — Progress Notes (Signed)
Discussed with resident, and I agree with plan.

## 2014-02-11 ENCOUNTER — Ambulatory Visit: Payer: Self-pay

## 2014-02-21 ENCOUNTER — Other Ambulatory Visit: Payer: Self-pay | Admitting: Family Medicine

## 2014-02-21 DIAGNOSIS — G4700 Insomnia, unspecified: Secondary | ICD-10-CM

## 2014-02-21 NOTE — Telephone Encounter (Signed)
Pt was approved for orange card, needs all her meds except tramadol to be sent to [facility] so she can use the [facility] program.

## 2014-02-24 MED ORDER — AMLODIPINE BESYLATE 10 MG PO TABS: 10.0000 mg | ORAL_TABLET | Freq: Every day | ORAL | Status: DC

## 2014-02-24 MED ORDER — ASPIRIN 325 MG PO TABS: 325.0000 mg | ORAL_TABLET | Freq: Every day | ORAL | Status: DC

## 2014-02-24 MED ORDER — CARVEDILOL 6.25 MG PO TABS: 6.2500 mg | ORAL_TABLET | Freq: Two times a day (BID) | ORAL | Status: DC

## 2014-02-24 MED ORDER — TRAZODONE HCL 100 MG PO TABS: 100.0000 mg | ORAL_TABLET | Freq: Every evening | ORAL | Status: DC | PRN

## 2014-02-24 MED ORDER — CLOPIDOGREL BISULFATE 75 MG PO TABS: 75.0000 mg | ORAL_TABLET | Freq: Every day | ORAL | Status: DC

## 2014-02-24 MED ORDER — ATORVASTATIN CALCIUM 80 MG PO TABS: 80.0000 mg | ORAL_TABLET | Freq: Every day | ORAL | Status: DC

## 2014-02-24 NOTE — Telephone Encounter (Signed)
Placed in bin to be faxed to [facility].   Thanks, Archie Patten, MD [facility] Family Medicine Resident  02/24/2014, 1:27 PM

## 2014-02-24 NOTE — Telephone Encounter (Signed)
Pt called again checking status of medication refills to be sent to health dept.

## 2014-02-25 NOTE — Telephone Encounter (Signed)
Patient informed, expressed understanding. 

## 2014-02-28 ENCOUNTER — Ambulatory Visit (INDEPENDENT_AMBULATORY_CARE_PROVIDER_SITE_OTHER): Payer: Self-pay

## 2014-02-28 ENCOUNTER — Encounter: Payer: Self-pay | Admitting: Family Medicine

## 2014-02-28 ENCOUNTER — Telehealth: Payer: Self-pay | Admitting: *Deleted

## 2014-02-28 ENCOUNTER — Ambulatory Visit (INDEPENDENT_AMBULATORY_CARE_PROVIDER_SITE_OTHER): Payer: Self-pay | Admitting: Family Medicine

## 2014-02-28 VITALS — BP 178/90 | HR 91 | Temp 98.1°F | Ht 75.0 in | Wt 182.7 lb

## 2014-02-28 DIAGNOSIS — R208 Other disturbances of skin sensation: Secondary | ICD-10-CM

## 2014-02-28 DIAGNOSIS — I10 Essential (primary) hypertension: Secondary | ICD-10-CM

## 2014-02-28 DIAGNOSIS — Z720 Tobacco use: Secondary | ICD-10-CM

## 2014-02-28 DIAGNOSIS — Z23 Encounter for immunization: Secondary | ICD-10-CM

## 2014-02-28 DIAGNOSIS — F17200 Nicotine dependence, unspecified, uncomplicated: Secondary | ICD-10-CM

## 2014-02-28 DIAGNOSIS — M545 Low back pain, unspecified: Secondary | ICD-10-CM

## 2014-02-28 DIAGNOSIS — Z0000 Encounter for general adult medical examination without abnormal findings: Secondary | ICD-10-CM

## 2014-02-28 DIAGNOSIS — E785 Hyperlipidemia, unspecified: Secondary | ICD-10-CM

## 2014-02-28 LAB — POCT GLYCOSYLATED HEMOGLOBIN (HGB A1C): Hemoglobin A1C: 5.7

## 2014-02-28 MED ORDER — TRAMADOL HCL 50 MG PO TABS: 50.0000 mg | ORAL_TABLET | Freq: Three times a day (TID) | ORAL | Status: DC | PRN

## 2014-02-28 MED ORDER — CYCLOBENZAPRINE HCL 10 MG PO TABS: ORAL_TABLET | ORAL | Status: DC

## 2014-02-28 NOTE — Assessment & Plan Note (Signed)
Patient symptoms seem concerning for idiopathic neuropathy versus diabetic neuropathy (no history of diabetes), versus restless leg syndrome as it is only at night. Patient has a history of anemia without anemia currently. We'll evaluate for thyroid dysfunction, LFTs, and iron deficiency. -Continue iron supplement and multivitamin -TSH, iron panel, CMET pending for today

## 2014-02-28 NOTE — Assessment & Plan Note (Signed)
Stable. It may reflect on exam and history. From patient history, her lumbar pain may have actually improved slightly. Refilled Flexeril and tramadol

## 2014-02-28 NOTE — Progress Notes (Signed)
[facility]  Leah Patten, Leah [name]  Subjective:  Chief complaint: Flu shot, medication refill  Leah [name] is a 63 year old with past medical history of CAD, PVD, hypertension, tobacco abuse, insomnia, chronic lower back pain, and hyperlipidemia presenting for follow-up appointment.  Burning and feet bilaterally: Patient notes that she continues to have burning in her ankles and her feet that occur 2 times per week. This only occurs at night. She denies any numbness or tingling. No dysuria, polyuria, polydipsia. Has taken iron supplements, without resolution.  Insomnia: Patient sleeping much better now that trazodone has been increased to 100 mg nightly.  Hypertension: Patient has been taking amlodipine 10 mg. Has not taken Coreg since last Thursday, due to difficulties establishing with [facility]. Patient denies any chest pain, or shortness of breath. No headaches or vision change.  Smokes 1/2 ppd   Past Medical History Patient Active Problem List   Diagnosis Date Noted  . Burning sensation of feet 02/01/2014  . Jaw pain 10/18/2012  . Low back pain 07/05/2012  . Insomnia 07/05/2012  . Peripheral vascular disease, unspecified 10/25/2011  . PVC (premature ventricular contraction) 10/02/2011  . Fatigue 09/04/2011  . Chest pain 09/04/2011  . History of angioedema 06/14/2011  . Atherosclerosis of native arteries of the extremities with intermittent claudication 06/04/2011  . TOBACCO ABUSE 06/12/2009  . Hyperlipidemia 09/28/2008  . Essential hypertension, benign 09/28/2008  . Coronary atherosclerosis of native coronary artery 09/28/2008    Medications- reviewed and updated Current Outpatient Prescriptions  Medication Sig Dispense Refill  . amLODipine (NORVASC) 10 MG tablet Take 1 tablet (10 mg total) by mouth daily. 30 tablet 2  . aspirin 325 MG tablet Take 1 tablet (325 mg total) by mouth daily. 30 tablet 2  . atorvastatin (LIPITOR) 80 MG tablet Take 1 tablet (80 mg  total) by mouth daily. 30 tablet 2  . clopidogrel (PLAVIX) 75 MG tablet Take 1 tablet (75 mg total) by mouth daily. 30 tablet 2  . cyclobenzaprine (FLEXERIL) 10 MG tablet TAKE ONE TABLET BY MOUTH THREE TIMES DAILY AS NEEDED FOR MUSCLE SPASMS 60 tablet 0  . ibuprofen (ADVIL,MOTRIN) 200 MG tablet Take 400 mg by mouth 3 (three) times daily as needed for pain.    . traMADol (ULTRAM) 50 MG tablet Take 1 tablet (50 mg total) by mouth every 8 (eight) hours as needed. 60 tablet 0  . traZODone (DESYREL) 100 MG tablet Take 1 tablet (100 mg total) by mouth at bedtime as needed for sleep. 30 tablet 2  . carvedilol (COREG) 6.25 MG tablet Take 1 tablet (6.25 mg total) by mouth 2 (two) times daily with a meal. (Patient not taking: Reported on 02/28/2014) 30 tablet 2  . [DISCONTINUED] buPROPion (WELLBUTRIN SR) 150 MG 12 hr tablet Take 1 tablet (150 mg total) by mouth 2 (two) times daily. 60 tablet 3  . [DISCONTINUED] simvastatin (ZOCOR) 40 MG tablet Take 40 mg by mouth at bedtime.      No current facility-administered medications for this visit.    Objective: BP 178/90 mmHg  Pulse 91  Temp(Src) 98.1 F (36.7 C) (Oral)  Ht 6\' 3"  (1.905 m)  Wt 182 lb 11.2 oz (82.872 kg)  BMI 22.84 kg/m2 Gen: No acute distress. Alert, cooperative with exam HEENT: Atraumatic, EOMI, PERRLA, Oropharynx clear. MMM CV: RRR. II/VI systolic murmur heard best at the R sternal order, No rubs or gallops noted. 2+ radial pulses bilaterally. Resp: CTAB. No wheezing, crackles, or rhonchi noted. Abd: +BS. Soft, non-distended,  non-tender. No rebound or guarding.  Ext: No edema. No gross deformities.  Neuro: Alert and oriented, No gross focal deficits. No pain over the spinal processes, no drop-offs noted. Pain with palpation over the lumbar paraspinal muscles bilaterally. Negative straight leg test bilaterally.  Assessment/Plan:  Low back pain Stable. It may reflect on exam and history. From patient history, her lumbar pain may have  actually improved slightly. Refilled Flexeril and tramadol  TOBACCO ABUSE Patient continues to smoke half a pack per day. States that she is now eager and willing to stop smoking. States she does not need any assistance -Continue to discuss smoking cessation  Essential hypertension, benign Patient's blood pressure grossly elevated on presentation today. She has not taken her Coreg in approximately 5 days. No signs, symptoms of hypertension -Patient to refill her prescription for Coreg today -Patient to return for pressure check in approximately 1 week -If patient's blood pressure continues to be elevated, and her heart rate permits consider increasing Coreg dosage. Patient unable to take lisinopril or ARBs due to history of angioedema.  Hyperlipidemia Patient currently on Lipitor 80 mg daily without side effects --Check lipid panel today -Continue Lipitor  Burning sensation of feet Patient symptoms seem concerning for idiopathic neuropathy versus diabetic neuropathy (no history of diabetes), versus restless leg syndrome as it is only at night. Patient has a history of anemia without anemia currently. We'll evaluate for thyroid dysfunction, LFTs, and iron deficiency. -Continue iron supplement and multivitamin -TSH, iron panel, CMET pending for today    Orders Placed This Encounter  Procedures  . Lipid Panel  . Comprehensive metabolic panel  . TSH  . TSH  . Ferritin  . Iron and TIBC  . HgB A1c    Meds ordered this encounter  Medications  . traMADol (ULTRAM) 50 MG tablet    Sig: Take 1 tablet (50 mg total) by mouth every 8 (eight) hours as needed.    Dispense:  60 tablet    Refill:  0  . cyclobenzaprine (FLEXERIL) 10 MG tablet    Sig: TAKE ONE TABLET BY MOUTH THREE TIMES DAILY AS NEEDED FOR MUSCLE SPASMS    Dispense:  60 tablet    Refill:  0  ]

## 2014-02-28 NOTE — Addendum Note (Signed)
Addended by: Archie Patten on: 02/28/2014 05:04 PM   Modules accepted: Orders

## 2014-02-28 NOTE — Assessment & Plan Note (Signed)
Patient continues to smoke half a pack per day. States that she is now eager and willing to stop smoking. States she does not need any assistance -Continue to discuss smoking cessation

## 2014-02-28 NOTE — Telephone Encounter (Signed)
Quantity changed to #60.  Derl Barrow, RN

## 2014-02-28 NOTE — Telephone Encounter (Signed)
Received message from Juliann Pulse, Pharmacist with [facility]. Needing clarification in quantity for Rx Coreg.  Coreg written at as Take 1 tablet (6.25 mg total) by mouth 2 (two) times daily with a meal; quantity 30.  If the directions are correct need to change quantity to 60.  Derl Barrow, RN

## 2014-02-28 NOTE — Assessment & Plan Note (Signed)
Patient's blood pressure grossly elevated on presentation today. She has not taken her Coreg in approximately 5 days. No signs, symptoms of hypertension -Patient to refill her prescription for Coreg today -Patient to return for pressure check in approximately 1 week -If patient's blood pressure continues to be elevated, and her heart rate permits consider increasing Coreg dosage. Patient unable to take lisinopril or ARBs due to history of angioedema.

## 2014-02-28 NOTE — Assessment & Plan Note (Signed)
Patient currently on Lipitor 80 mg daily without side effects --Check lipid panel today -Continue Lipitor

## 2014-02-28 NOTE — Patient Instructions (Signed)
It was great to see you again. I will call you if your lab results are NOT normal Please follow up with me in 1 month

## 2014-02-28 NOTE — Telephone Encounter (Signed)
Please change quantity to #60.  Thanks, [employer]

## 2014-03-01 LAB — COMPREHENSIVE METABOLIC PANEL
ALBUMIN: 4.4 g/dL (ref 3.5–5.2)
ALK PHOS: 170 U/L — AB (ref 39–117)
ALT: 9 U/L (ref 0–35)
AST: 14 U/L (ref 0–37)
BUN: 10 mg/dL (ref 6–23)
CHLORIDE: 104 meq/L (ref 96–112)
CO2: 26 meq/L (ref 19–32)
Calcium: 9.6 mg/dL (ref 8.4–10.5)
Creat: 0.86 mg/dL (ref 0.50–1.10)
Glucose, Bld: 86 mg/dL (ref 70–99)
Potassium: 3.4 mEq/L — ABNORMAL LOW (ref 3.5–5.3)
Sodium: 140 mEq/L (ref 135–145)
Total Bilirubin: 0.8 mg/dL (ref 0.2–1.2)
Total Protein: 7.1 g/dL (ref 6.0–8.3)

## 2014-03-01 LAB — LIPID PANEL
CHOL/HDL RATIO: 6 ratio
CHOLESTEROL: 250 mg/dL — AB (ref 0–200)
HDL: 42 mg/dL (ref >39)
LDL Cholesterol: 189 mg/dL — ABNORMAL HIGH (ref 0–99)
Triglycerides: 97 mg/dL (ref <150)
VLDL: 19 mg/dL (ref 0–40)

## 2014-03-01 LAB — IRON AND TIBC
%SAT: 29 % (ref 20–55)
IRON: 94 ug/dL (ref 42–145)
TIBC: 326 ug/dL (ref 250–470)
UIBC: 232 ug/dL (ref 125–400)

## 2014-03-01 LAB — FERRITIN: FERRITIN: 79 ng/mL (ref 10–291)

## 2014-03-01 LAB — TSH: TSH: 0.378 u[IU]/mL (ref 0.350–4.500)

## 2014-03-07 ENCOUNTER — Ambulatory Visit (INDEPENDENT_AMBULATORY_CARE_PROVIDER_SITE_OTHER): Payer: Self-pay | Admitting: *Deleted

## 2014-03-07 VITALS — BP 162/84 | HR 77

## 2014-03-07 DIAGNOSIS — Z0130 Encounter for examination of blood pressure without abnormal findings: Secondary | ICD-10-CM

## 2014-03-07 DIAGNOSIS — Z136 Encounter for screening for cardiovascular disorders: Secondary | ICD-10-CM

## 2014-03-07 DIAGNOSIS — I10 Essential (primary) hypertension: Secondary | ICD-10-CM

## 2014-03-07 MED ORDER — CARVEDILOL 6.25 MG PO TABS: ORAL_TABLET | ORAL | Status: DC

## 2014-03-07 NOTE — Progress Notes (Signed)
   Pt in nurse clinic for blood pressure check.  Blood pressure 162/84 manually, heart rate 77.  Pt denies any symptoms and taking medications as prescribed.  Will forward to PCP. Derl Barrow, RN

## 2014-03-07 NOTE — Progress Notes (Signed)
Called Ms. Ringwood concerning her elevated BPs. Will increase Coreg to 6.25mg  in the AM and 12.5mg  qHS. If heart rate permits, will increase to 12.5mg  BID in the future.  Archie Patten, MD [facility] Family Medicine Resident  03/07/2014, 11:39 AM

## 2014-03-25 HISTORY — PX: MITRAL VALVE REPAIR: SHX2039

## 2014-03-28 ENCOUNTER — Other Ambulatory Visit: Payer: Self-pay | Admitting: Family Medicine

## 2014-03-28 DIAGNOSIS — M545 Low back pain, unspecified: Secondary | ICD-10-CM

## 2014-03-28 NOTE — Telephone Encounter (Signed)
Needs refill on tramadol  °

## 2014-03-29 MED ORDER — TRAMADOL HCL 50 MG PO TABS: 50.0000 mg | ORAL_TABLET | Freq: Three times a day (TID) | ORAL | Status: DC | PRN

## 2014-03-29 NOTE — Telephone Encounter (Signed)
Please let the patient know that I refilled her Rx for Tramadol and it is at the front desk. Additionally, ask her to make an appointment with me in the next 1-2 months so we can assess her BP with the increased dose of Coreg.  Thanks, Archie Patten, MD [facility] Family Medicine Resident  03/29/2014, 4:56 PM

## 2014-03-30 ENCOUNTER — Other Ambulatory Visit: Payer: Self-pay | Admitting: Family Medicine

## 2014-03-30 NOTE — Telephone Encounter (Signed)
Patient informed, expressed understanding. 

## 2014-04-25 ENCOUNTER — Other Ambulatory Visit: Payer: Self-pay | Admitting: Family Medicine

## 2014-04-25 NOTE — Telephone Encounter (Signed)
Please let the patient know that her tramadol Rx is at the front desk.   Thanks, Archie Patten, MD [facility] Family Medicine Resident  04/25/2014, 5:51 PM

## 2014-04-26 NOTE — Telephone Encounter (Signed)
Pt informed. Leah [name], CMA  

## 2014-05-24 ENCOUNTER — Ambulatory Visit (INDEPENDENT_AMBULATORY_CARE_PROVIDER_SITE_OTHER): Payer: Self-pay | Admitting: Family Medicine

## 2014-05-24 ENCOUNTER — Encounter: Payer: Self-pay | Admitting: Family Medicine

## 2014-05-24 VITALS — BP 175/91 | HR 91 | Temp 97.7°F | Ht 75.0 in | Wt 181.0 lb

## 2014-05-24 DIAGNOSIS — R300 Dysuria: Secondary | ICD-10-CM

## 2014-05-24 DIAGNOSIS — M545 Low back pain, unspecified: Secondary | ICD-10-CM

## 2014-05-24 DIAGNOSIS — G4700 Insomnia, unspecified: Secondary | ICD-10-CM

## 2014-05-24 DIAGNOSIS — I10 Essential (primary) hypertension: Secondary | ICD-10-CM

## 2014-05-24 LAB — POCT URINALYSIS DIPSTICK
BILIRUBIN UA: NEGATIVE
Glucose, UA: NEGATIVE
Ketones, UA: NEGATIVE
Leukocytes, UA: NEGATIVE
Nitrite, UA: NEGATIVE
PH UA: 6.5
Protein, UA: 30
Spec Grav, UA: 1.02
Urobilinogen, UA: 2

## 2014-05-24 LAB — POCT UA - MICROSCOPIC ONLY

## 2014-05-24 MED ORDER — TRAMADOL HCL 50 MG PO TABS: ORAL_TABLET | ORAL | Status: DC

## 2014-05-24 MED ORDER — CYCLOBENZAPRINE HCL 10 MG PO TABS: ORAL_TABLET | ORAL | Status: DC

## 2014-05-24 MED ORDER — ATORVASTATIN CALCIUM 80 MG PO TABS: 80.0000 mg | ORAL_TABLET | Freq: Every day | ORAL | Status: DC

## 2014-05-24 MED ORDER — AMLODIPINE BESYLATE 10 MG PO TABS: 10.0000 mg | ORAL_TABLET | Freq: Every day | ORAL | Status: DC

## 2014-05-24 MED ORDER — CARVEDILOL 12.5 MG PO TABS: 12.5000 mg | ORAL_TABLET | Freq: Two times a day (BID) | ORAL | Status: DC

## 2014-05-24 MED ORDER — SULFAMETHOXAZOLE-TRIMETHOPRIM 800-160 MG PO TABS: 1.0000 | ORAL_TABLET | Freq: Two times a day (BID) | ORAL | Status: DC

## 2014-05-24 MED ORDER — CLOPIDOGREL BISULFATE 75 MG PO TABS: 75.0000 mg | ORAL_TABLET | Freq: Every day | ORAL | Status: DC

## 2014-05-24 MED ORDER — TRAZODONE HCL 100 MG PO TABS: 100.0000 mg | ORAL_TABLET | Freq: Every evening | ORAL | Status: DC | PRN

## 2014-05-24 MED ORDER — ASPIRIN 325 MG PO TABS: 325.0000 mg | ORAL_TABLET | Freq: Every day | ORAL | Status: DC

## 2014-05-24 NOTE — Patient Instructions (Addendum)
If your symptoms do not improve in 3-5 days, please come back and see me. Please increase Coreg to 12.5mg  twice daily. Please follow up with me in 1 month. Hypertension Hypertension, commonly called high blood pressure, is when the force of blood pumping through your arteries is too strong. Your arteries are the blood vessels that carry blood from your heart throughout your body. A blood pressure reading consists of a higher number over a lower number, such as 110/72. The higher number (systolic) is the pressure inside your arteries when your heart pumps. The lower number (diastolic) is the pressure inside your arteries when your heart relaxes. Ideally you want your blood pressure below 120/80. Hypertension forces your heart to work harder to pump blood. Your arteries may become narrow or stiff. Having hypertension puts you at risk for heart disease, stroke, and other problems.  RISK FACTORS Some risk factors for high blood pressure are controllable. Others are not.  Risk factors you cannot control include:   Race. You may be at higher risk if you are African American.  Age. Risk increases with age.  Gender. Men are at higher risk than women before age 49 years. After age 35, women are at higher risk than men. Risk factors you can control include:  Not getting enough exercise or physical activity.  Being overweight.  Getting too much fat, sugar, calories, or salt in your diet.  Drinking too much alcohol. SIGNS AND SYMPTOMS Hypertension does not usually cause signs or symptoms. Extremely high blood pressure (hypertensive crisis) may cause headache, anxiety, shortness of breath, and nosebleed. DIAGNOSIS  To check if you have hypertension, your health care provider will measure your blood pressure while you are seated, with your arm held at the level of your heart. It should be measured at least twice using the same arm. Certain conditions can cause a difference in blood pressure between your  right and left arms. A blood pressure reading that is higher than normal on one occasion does not mean that you need treatment. If one blood pressure reading is high, ask your health care provider about having it checked again. TREATMENT  Treating high blood pressure includes making lifestyle changes and possibly taking medicine. Living a healthy lifestyle can help lower high blood pressure. You may need to change some of your habits. Lifestyle changes may include:  Following the DASH diet. This diet is high in fruits, vegetables, and whole grains. It is low in salt, red meat, and added sugars.  Getting at least 2 hours of brisk physical activity every week.  Losing weight if necessary.  Not smoking.  Limiting alcoholic beverages.  Learning ways to reduce stress. If lifestyle changes are not enough to get your blood pressure under control, your health care provider may prescribe medicine. You may need to take more than one. Work closely with your health care provider to understand the risks and benefits. HOME CARE INSTRUCTIONS  Have your blood pressure rechecked as directed by your health care provider.   Take medicines only as directed by your health care provider. Follow the directions carefully. Blood pressure medicines must be taken as prescribed. The medicine does not work as well when you skip doses. Skipping doses also puts you at risk for problems.   Do not smoke.   Monitor your blood pressure at home as directed by your health care provider. SEEK MEDICAL CARE IF:   You think you are having a reaction to medicines taken.  You have recurrent headaches  or feel dizzy.  You have swelling in your ankles.  You have trouble with your vision. SEEK IMMEDIATE MEDICAL CARE IF:  You develop a severe headache or confusion.  You have unusual weakness, numbness, or feel faint.  You have severe chest or abdominal pain.  You vomit repeatedly.  You have trouble  breathing. MAKE SURE YOU:   Understand these instructions.  Will watch your condition.  Will get help right away if you are not doing well or get worse. Document Released: 03/11/2005 Document Revised: 07/26/2013 Document Reviewed: 01/01/2013 [facility] Patient Information 2015 [place], [place]. This information is not intended to replace advice given to you by your health care provider. Make sure you discuss any questions you have with your health care provider.

## 2014-05-24 NOTE — Assessment & Plan Note (Signed)
Patient's BP continues to be grossly elevated despite reported compliance.  - Will increase Coreg to 12.5mg  BID - Continue amlodipine  - in the future consider the addition of HCTZ if the patient's BP continues to be elevated. - Patient unable to tolerate ACE-i and ARBS due to h/o angioedema. -f/u in 1 month

## 2014-05-24 NOTE — Assessment & Plan Note (Signed)
Stable from exam and history. No red flags on history/exam: No radicular pain, incontinence, or paresthesias. - Refilled Flexeril and tramadol  - Encouraged heat therapy and stretching - In the future, could benefit from PT

## 2014-05-24 NOTE — Assessment & Plan Note (Signed)
U/A negative for LE and nitrite, however does show 1+bacteria and patient's reported symptoms and h/o UTIs is suggestive of a UTI.  - Will empirically treat with Bactrim DS BID x 3 days - Unfortunately, unable to obtain a urine culture as the specimen was disposed. - Patient to f/u at the end of the week if her symptoms have not improved - Patient has had hematuria on her last few U/As, however all have been in the setting of an infection, once this has resolved, patient to produce another specimen: if she continues to have hematuria would consider referral to urology due to age for cystoscopy.

## 2014-05-24 NOTE — Progress Notes (Signed)
Patient ID: Leah [name], female   DOB: May 07, 1950, 64 y.o.   MRN: [id number]    Subjective: CC:f/u HTN, dysuria  HPI: Patient is a 64 y.o. female presenting to clinic today for HTN and dysuria. Concerns today include:  1. Dysuria: Dysuria x 1 month. Urinary frequency and urinary urgency. No hematuria Vaginal pruritus, no discharge. Suprapubic pressure when she hasn't urinated in a while. She has had UTIs in the past and feels like this is what she has. Declines any concerns for STDs and does not wish to have pelvic exam/testing today.   2.  Lumbar back pain:  Pain well controlled on flexeril and tramadol. Pain in the lumbar region, constant and aching, worse with movement.  Also worse when sitting for a long time with improvement with stretching.  No radicular pain. No urinary or bowel incontinence. No saddle paresthesias.   3. Hypertension: Blood pressure at home:doesn't take  Blood pressure today: 175/91; repeat manual 168/86 Taking Meds: amlodipine and coreg (2 months ago we increased coreg to 6.25mg  in AM 12.5 in PM) ROS: Denies dizziness, visual changes, nausea, vomiting, chest pain, abdominal pain or shortness of breath. Endorses frontal headaches (notices it takes a few minutes for her anti-hypertensive). Endorses scotoma, 1-2x/week   Social History: smoking status reviewed.  Health Maintenance: Patient due for colonoscopy   ROS: All other systems reviewed and are negative.  Past Medical History Patient Active Problem List   Diagnosis Date Noted  . Dysuria 05/24/2014  . Burning sensation of feet 02/01/2014  . Jaw pain 10/18/2012  . Low back pain 07/05/2012  . Insomnia 07/05/2012  . Peripheral vascular disease, unspecified 10/25/2011  . PVC (premature ventricular contraction) 10/02/2011  . Fatigue 09/04/2011  . Chest pain 09/04/2011  . History of angioedema 06/14/2011  . Atherosclerosis of native arteries of the extremities with intermittent claudication  06/04/2011  . TOBACCO ABUSE 06/12/2009  . Hyperlipidemia 09/28/2008  . Essential hypertension, benign 09/28/2008  . Coronary atherosclerosis of native coronary artery 09/28/2008    Medications- reviewed and updated Current Outpatient Prescriptions  Medication Sig Dispense Refill  . amLODipine (NORVASC) 10 MG tablet Take 1 tablet (10 mg total) by mouth daily. 30 tablet 2  . aspirin 325 MG tablet Take 1 tablet (325 mg total) by mouth daily. 30 tablet 2  . atorvastatin (LIPITOR) 80 MG tablet Take 1 tablet (80 mg total) by mouth daily. 30 tablet 2  . carvedilol (COREG) 12.5 MG tablet Take 1 tablet (12.5 mg total) by mouth 2 (two) times daily with a meal. 60 tablet 2  . clopidogrel (PLAVIX) 75 MG tablet Take 1 tablet (75 mg total) by mouth daily. 30 tablet 2  . cyclobenzaprine (FLEXERIL) 10 MG tablet TAKE 1 TABLET BY MOUTH 3 TIMES DAILY AS NEEDED FOR MUSCLE SPASMS 60 tablet 0  . traMADol (ULTRAM) 50 MG tablet TAKE 1 TABLET BY MOUTH EVERY 8 HOURS AS NEEDED FOR SEVERE PAIN 60 tablet 0  . sulfamethoxazole-trimethoprim (BACTRIM DS,SEPTRA DS) 800-160 MG per tablet Take 1 tablet by mouth 2 (two) times daily. 6 tablet 0  . traZODone (DESYREL) 100 MG tablet Take 1 tablet (100 mg total) by mouth at bedtime as needed for sleep. 30 tablet 2  . [DISCONTINUED] buPROPion (WELLBUTRIN SR) 150 MG 12 hr tablet Take 1 tablet (150 mg total) by mouth 2 (two) times daily. 60 tablet 3  . [DISCONTINUED] simvastatin (ZOCOR) 40 MG tablet Take 40 mg by mouth at bedtime.      No current facility-administered  medications for this visit.    Objective: Office vital signs reviewed. BP 175/91 mmHg  Pulse 91  Temp(Src) 97.7 F (36.5 C) (Oral)  Ht 6\' 3"  (1.905 m)  Wt 181 lb (82.101 kg)  BMI 22.62 kg/m2   Physical Examination:  General: Awake, alert, well- nourished, NAD HEENT: Normal    Neck: No masses palpated. No LAD    Ears: TMs intact, normal light reflex, no erythema, no bulging    Eyes: PERRLA, EOMI     Nose:  nasal turbinates moist    Throat: MMM, no erythema Cardio: RRR, S1S2 heard, no murmurs appreciated Pulm: CTAB, no wheezes, rhonchi or crackles noted GI: soft, NT/ND,+BS x4. No CVA tenderness MSK: Normal gait and station.  No pain with sensation over the spinal processes or paraspinal muscles. Straight leg raise negative bilaterally. 5/5 strength in left LE, 4+/5 in the right LE.  Skin: dry, intact, no rashes or lesions  Urine dipstick shows positive for RBC's, positive for protein and positive for urobilinogen.  Micro exam: rare WBC's per HPF, 5=15 RBC's per HPF and 1+bacteria.  Assessment/Plan: Dysuria U/A negative for LE and nitrite, however does show 1+bacteria and patient's reported symptoms and h/o UTIs is suggestive of a UTI.  - Will empirically treat with Bactrim DS BID x 3 days - Unfortunately, unable to obtain a urine culture as the specimen was disposed. - Patient to f/u at the end of the week if her symptoms have not improved - Patient has had hematuria on her last few U/As, however all have been in the setting of an infection, once this has resolved, patient to produce another specimen: if she continues to have hematuria would consider referral to urology due to age for cystoscopy.    Essential hypertension, benign Patient's BP continues to be grossly elevated despite reported compliance.  - Will increase Coreg to 12.5mg  BID - Continue amlodipine  - in the future consider the addition of HCTZ if the patient's BP continues to be elevated. - Patient unable to tolerate ACE-i and ARBS due to h/o angioedema. -f/u in 1 month   Low back pain Stable from exam and history. No red flags on history/exam: No radicular pain, incontinence, or paresthesias. - Refilled Flexeril and tramadol  - Encouraged heat therapy and stretching - In the future, could benefit from PT     Orders Placed This Encounter  Procedures  . Urine culture  . Ambulatory referral to Ophthalmology     Referral Priority:  Routine    Referral Type:  Consultation    Referral Reason:  Specialty Services Required    Requested Specialty:  Ophthalmology    Number of Visits Requested:  1  . POCT urinalysis dipstick  . POCT UA - Microscopic Only    Meds ordered this encounter  Medications  . sulfamethoxazole-trimethoprim (BACTRIM DS,SEPTRA DS) 800-160 MG per tablet    Sig: Take 1 tablet by mouth 2 (two) times daily.    Dispense:  6 tablet    Refill:  0  . amLODipine (NORVASC) 10 MG tablet    Sig: Take 1 tablet (10 mg total) by mouth daily.    Dispense:  30 tablet    Refill:  2  . aspirin 325 MG tablet    Sig: Take 1 tablet (325 mg total) by mouth daily.    Dispense:  30 tablet    Refill:  2  . atorvastatin (LIPITOR) 80 MG tablet    Sig: Take 1 tablet (80 mg total) by  mouth daily.    Dispense:  30 tablet    Refill:  2  . carvedilol (COREG) 12.5 MG tablet    Sig: Take 1 tablet (12.5 mg total) by mouth 2 (two) times daily with a meal.    Dispense:  60 tablet    Refill:  2  . clopidogrel (PLAVIX) 75 MG tablet    Sig: Take 1 tablet (75 mg total) by mouth daily.    Dispense:  30 tablet    Refill:  2  . cyclobenzaprine (FLEXERIL) 10 MG tablet    Sig: TAKE 1 TABLET BY MOUTH 3 TIMES DAILY AS NEEDED FOR MUSCLE SPASMS    Dispense:  60 tablet    Refill:  0  . traZODone (DESYREL) 100 MG tablet    Sig: Take 1 tablet (100 mg total) by mouth at bedtime as needed for sleep.    Dispense:  30 tablet    Refill:  2  . traMADol (ULTRAM) 50 MG tablet    Sig: TAKE 1 TABLET BY MOUTH EVERY 8 HOURS AS NEEDED FOR SEVERE PAIN    Dispense:  60 tablet    Refill:  [place] PGY-1, [place]

## 2014-06-22 ENCOUNTER — Other Ambulatory Visit: Payer: Self-pay | Admitting: *Deleted

## 2014-06-22 ENCOUNTER — Other Ambulatory Visit: Payer: Self-pay | Admitting: Family Medicine

## 2014-06-22 MED ORDER — CYCLOBENZAPRINE HCL 10 MG PO TABS: ORAL_TABLET | ORAL | Status: DC

## 2014-06-22 MED ORDER — TRAMADOL HCL 50 MG PO TABS: ORAL_TABLET | ORAL | Status: DC

## 2014-06-22 NOTE — Telephone Encounter (Signed)
Need refill for tramadol.  Please inform when sent

## 2014-06-22 NOTE — Telephone Encounter (Signed)
Please let the patient know that I refilled her Rx for tramadol and it is at the front desk, her Rx for Flexeril has already been electronically sent to the pharmacy. Please ask her to keep her upcoming appt with me.  Thanks,  Archie Patten, MD [facility] [address] Family Medicine Resident  06/22/2014, 5:05 PM

## 2014-06-23 NOTE — Telephone Encounter (Signed)
Tried calling, no answer, mailbox full unable to leave message.

## 2014-06-24 NOTE — Telephone Encounter (Signed)
Patient informed, expressed understanding. 

## 2014-06-27 ENCOUNTER — Other Ambulatory Visit: Payer: Self-pay | Admitting: Family Medicine

## 2014-06-27 NOTE — Telephone Encounter (Signed)
Pt informed rx sent. Leah [name] Holter, CMA

## 2014-06-27 NOTE — Telephone Encounter (Signed)
Calling to check status on med refill sent last Wednesday / Leah [name], ASA

## 2014-06-28 ENCOUNTER — Other Ambulatory Visit: Payer: Self-pay | Admitting: Family Medicine

## 2014-06-28 NOTE — Telephone Encounter (Signed)
Pt called because she said that the pharmacy does not have her refill of Flexeril. jw

## 2014-06-28 NOTE — Telephone Encounter (Signed)
Rx called into [facility].

## 2014-06-29 NOTE — Telephone Encounter (Signed)
Pt called the pharmacy twice and was told there is no RX there Please advise

## 2014-06-29 NOTE — Telephone Encounter (Signed)
Left voice message on [facility] line with Rx information.  Pt was also called to let her Rx was called in again.  Pt to call clinic if there is a problem getting Rx.  Derl Barrow, RN

## 2014-07-01 ENCOUNTER — Ambulatory Visit (INDEPENDENT_AMBULATORY_CARE_PROVIDER_SITE_OTHER): Payer: Self-pay | Admitting: Family Medicine

## 2014-07-01 ENCOUNTER — Encounter: Payer: Self-pay | Admitting: Family Medicine

## 2014-07-01 VITALS — BP 162/78 | Temp 97.5°F | Ht 75.0 in | Wt 180.6 lb

## 2014-07-01 DIAGNOSIS — I10 Essential (primary) hypertension: Secondary | ICD-10-CM

## 2014-07-01 DIAGNOSIS — M545 Low back pain, unspecified: Secondary | ICD-10-CM

## 2014-07-01 DIAGNOSIS — R319 Hematuria, unspecified: Secondary | ICD-10-CM

## 2014-07-01 DIAGNOSIS — H539 Unspecified visual disturbance: Secondary | ICD-10-CM

## 2014-07-01 MED ORDER — CARVEDILOL 12.5 MG PO TABS: ORAL_TABLET | ORAL | Status: DC

## 2014-07-01 MED ORDER — HYDROCODONE-ACETAMINOPHEN 5-325 MG PO TABS: 1.0000 | ORAL_TABLET | Freq: Four times a day (QID) | ORAL | Status: DC | PRN

## 2014-07-01 NOTE — Assessment & Plan Note (Signed)
No red flags on history or exam: no radicular pain, bowel/bladder incontinence, paresthesias, tenderness over the spinal processes, and negative straight leg test. - Prescribed hydrocodone-APAP 5-325mg  PRN pain for 1 month - Continue Flexeril   - We discussed some strengthening exercises that could be beneficial  - Pt to f/u in 1 month, if her pain is still not well controlled will refer to PT

## 2014-07-01 NOTE — Assessment & Plan Note (Signed)
Patient with painless hematuria. Previous U/A appeared as though she could have an infection, she is now s/p treatment without symptoms. U/A today with small RBC, mirco showing 5-15 RBC/HPF; neg LE and nitrite. Pt has a remote h/o kidney stones, however no signs/symptoms currently.  - given the patient's age will refer to urology for cystoscopy

## 2014-07-01 NOTE — Assessment & Plan Note (Signed)
Patient's BP still not at goal, however it is improving.  Her HR is in the 70s, so I believe there is room to increase her coreg (pt does not wish to add meds if possible) as she's maxed out on amlodipine. - Will increase the patient's coreg to 12.5mg  in AM and 25mg  in PM, if HR does not tolerate this, will add HCTZ - continue amlodipine 10mg  - patient cannot tolerate ACE-i or ARB given h/o angioedema - Patient to f/u in 1 month

## 2014-07-01 NOTE — Progress Notes (Signed)
Patient ID: Leah [name], female   DOB: [birth year], 64 y.o.   MRN: [id number]    Subjective: CC:f/u on back pain HPI: Patient is a 64 y.o. female with a past medical history of lower back pain  presenting to clinic today for pain .   Lumbar back pain:  Pain not well controlled on Flexeril and tramadol. Per pt and records, she'll occasionally switch to hydrocodone-APAP and then back to tramadol for pain.  Pain in the lumbar region worse with movement/housework. The pain is "like a toothache," "nagging pain." Also worse when sitting for a long time and improvement with stretching. No radicular pain. No urinary or bowel incontinence. No saddle paresthesias. Knee pain, bilaterally with pain more severe in L>R. This occurs with housework, lifting, and mopping. Painful/"popping"  in the AM which resolves in approximately 1 hour. Not too bad at night except when she takes the stairs. Never had PT or steroid injections. Does not want an injection. Never notice swelling, redness, or warmth over the knees. 7/10 pain with exercise/movement, 0/10 at rest.  Takes approximately 4 tramadol per day.   Hypertension: Blood pressure at home:doesn't take  Blood pressure today: 164/60, repeat manual 160/78 Taking Meds: amlodipine and coreg 12.5mg  BID ROS: Denies dizziness, visual changes, nausea, vomiting, chest pain, abdominal pain. Sees black spots in her vision in the AM occasionally; wears glasses that she should wear constantly but she only wears them to read.  Has SOB when she smokes too much.  Social History: 1/2ppd, not interested in information/discussing today  Health Maintenance: due for colonoscopy   ROS: All other systems reviewed and are negative.  Past Medical History Patient Active Problem List   Diagnosis Date Noted  . Hematuria 07/01/2014  . Dysuria 05/24/2014  . Burning sensation of feet 02/01/2014  . Jaw pain 10/18/2012  . Low back pain 07/05/2012  . Insomnia 07/05/2012  .  Peripheral vascular disease, unspecified 10/25/2011  . PVC (premature ventricular contraction) 10/02/2011  . Fatigue 09/04/2011  . Chest pain 09/04/2011  . History of angioedema 06/14/2011  . Atherosclerosis of native arteries of the extremities with intermittent claudication 06/04/2011  . TOBACCO ABUSE 06/12/2009  . Hyperlipidemia 09/28/2008  . Essential hypertension, benign 09/28/2008  . Coronary atherosclerosis of native coronary artery 09/28/2008    Medications- reviewed and updated Current Outpatient Prescriptions  Medication Sig Dispense Refill  . amLODipine (NORVASC) 10 MG tablet Take 1 tablet (10 mg total) by mouth daily. 30 tablet 2  . aspirin 325 MG tablet Take 1 tablet (325 mg total) by mouth daily. 30 tablet 2  . atorvastatin (LIPITOR) 80 MG tablet Take 1 tablet (80 mg total) by mouth daily. 30 tablet 2  . carvedilol (COREG) 12.5 MG tablet Take 1 tablet (12.5mg ) in the morning and 2 tables (25mg ) in the evening. 90 tablet 1  . clopidogrel (PLAVIX) 75 MG tablet Take 1 tablet (75 mg total) by mouth daily. 30 tablet 2  . cyclobenzaprine (FLEXERIL) 10 MG tablet TAKE 1 TABLET BY MOUTH 3 TIMES DAILY AS NEEDED FOR MUSCLE SPASMS 60 tablet 0  . HYDROcodone-acetaminophen (NORCO/VICODIN) 5-325 MG per tablet Take 1 tablet by mouth every 6 (six) hours as needed for moderate pain. 30 tablet 0  . sulfamethoxazole-trimethoprim (BACTRIM DS,SEPTRA DS) 800-160 MG per tablet Take 1 tablet by mouth 2 (two) times daily. 6 tablet 0  . traMADol (ULTRAM) 50 MG tablet TAKE 1 TABLET BY MOUTH EVERY 8 HOURS AS NEEDED FOR SEVERE PAIN 60 tablet 0  .  traZODone (DESYREL) 100 MG tablet Take 1 tablet (100 mg total) by mouth at bedtime as needed for sleep. 30 tablet 2  . [DISCONTINUED] buPROPion (WELLBUTRIN SR) 150 MG 12 hr tablet Take 1 tablet (150 mg total) by mouth 2 (two) times daily. 60 tablet 3  . [DISCONTINUED] simvastatin (ZOCOR) 40 MG tablet Take 40 mg by mouth at bedtime.      No current  facility-administered medications for this visit.    Objective: Office vital signs reviewed. BP 162/78 mmHg  Temp(Src) 97.5 F (36.4 C) (Oral)  Ht 6\' 3"  (1.905 m)  Wt 180 lb 9.6 oz (81.92 kg)  BMI 22.57 kg/m2   Physical Examination:  General: Awake, alert, well- nourished, NAD; pleasant Cardio: RRR, no m/r/g noted. 2+ peripheral pulses bilaterally Pulm: No increased WOB.  CTAB, without wheezes, rhonchi or crackles noted.  GI: soft, NT/ND,+BS x4 MSK:No pain with palpation over the spinal processes or paraspinal muscles. Straight leg raise negative bilaterally. No swelling/effusions/ or erythema noted in the knees. Negative grind test. Negative anterior and posterior drawer tests.  5/5 strength in knee flexion/extension and hip flexion/extension bilaterally. Normal gait and station Skin: dry, intact, no rashes or lesions Neuro: No focal deficits   Urinalysis    Component Value Date/Time   COLORURINE YELLOW 01/18/2011 [place] 01/18/2011 0452   LABSPEC 1.007 01/18/2011 0452   PHURINE 5.5 01/18/2011 [place] 01/18/2011 0452   HGBUR NEGATIVE 01/18/2011 0452   BILIRUBINUR NEG 05/24/2014 [place] 01/18/2011 0452   [place] 01/18/2011 0452   PROTEINUR 30 05/24/2014 1048   PROTEINUR NEGATIVE 01/18/2011 0452   UROBILINOGEN 2.0 05/24/2014 1048   UROBILINOGEN 0.2 01/18/2011 0452   NITRITE NEG 05/24/2014 1048   NITRITE NEGATIVE 01/18/2011 0452   LEUKOCYTESUR Negative 05/24/2014 1048       Assessment/Plan: Essential hypertension, benign Patient's BP still not at goal, however it is improving.  Her HR is in the 70s, so I believe there is room to increase her coreg (pt does not wish to add meds if possible) as she's maxed out on amlodipine. - Will increase the patient's coreg to 12.5mg  in AM and 25mg  in PM, if HR does not tolerate this, will add HCTZ - continue amlodipine 10mg  - patient cannot tolerate ACE-i or ARB given  h/o angioedema - Patient to f/u in 1 month      Low back pain No red flags on history or exam: no radicular pain, bowel/bladder incontinence, paresthesias, tenderness over the spinal processes, and negative straight leg test. - Prescribed hydrocodone-APAP 5-325mg  PRN pain for 1 month - Continue Flexeril   - We discussed some strengthening exercises that could be beneficial  - Pt to f/u in 1 month, if her pain is still not well controlled will refer to PT   Hematuria Patient with painless hematuria. Previous U/A appeared as though she could have an infection, she is now s/p treatment without symptoms. U/A today with small RBC, mirco showing 5-15 RBC/HPF; neg LE and nitrite. Pt has a remote h/o kidney stones, however no signs/symptoms currently.  - given the patient's age will refer to urology for cystoscopy      Orders Placed This Encounter  Procedures  . Ambulatory referral to Urology    Referral Priority:  Routine    Referral Type:  Consultation    Referral Reason:  Specialty Services Required    Requested Specialty:  Urology    Number of Visits Requested:  1  . Ambulatory referral to Ophthalmology    Referral Priority:  Routine    Referral Type:  Consultation    Referral Reason:  Specialty Services Required    Requested Specialty:  Ophthalmology    Number of Visits Requested:  1    Meds ordered this encounter  Medications  . carvedilol (COREG) 12.5 MG tablet    Sig: Take 1 tablet (12.5mg ) in the morning and 2 tables (25mg ) in the evening.    Dispense:  90 tablet    Refill:  1  . HYDROcodone-acetaminophen (NORCO/VICODIN) 5-325 MG per tablet    Sig: Take 1 tablet by mouth every 6 (six) hours as needed for moderate pain.    Dispense:  30 tablet    Refill:  [place] PGY-1, [place]

## 2014-07-01 NOTE — Patient Instructions (Addendum)
It was good to see you again I have referred you to urology to assess the blood in your urine. I have written a prescription for hydrocodone to help with your pain. Please follow up with me in 1 month so we can look at your blood pressure and see how you pain is doing.

## 2014-07-20 ENCOUNTER — Other Ambulatory Visit: Payer: Self-pay | Admitting: Family Medicine

## 2014-07-20 DIAGNOSIS — R3129 Other microscopic hematuria: Secondary | ICD-10-CM

## 2014-07-27 ENCOUNTER — Encounter (HOSPITAL_COMMUNITY): Payer: Self-pay

## 2014-07-27 ENCOUNTER — Ambulatory Visit (HOSPITAL_COMMUNITY)
Admission: RE | Admit: 2014-07-27 | Discharge: 2014-07-27 | Disposition: A | Discharge status: Home / Self Care | Payer: Self-pay | Source: Ambulatory Visit | Attending: Family Medicine | Admitting: Family Medicine

## 2014-07-27 ENCOUNTER — Other Ambulatory Visit: Payer: Self-pay | Admitting: Family Medicine

## 2014-07-27 DIAGNOSIS — N200 Calculus of kidney: Secondary | ICD-10-CM | POA: Insufficient documentation

## 2014-07-27 DIAGNOSIS — R3129 Other microscopic hematuria: Secondary | ICD-10-CM

## 2014-07-27 DIAGNOSIS — R312 Other microscopic hematuria: Secondary | ICD-10-CM | POA: Insufficient documentation

## 2014-07-27 MED ORDER — IOHEXOL 300 MG/ML  SOLN
100.0000 mL | Freq: Once | INTRAMUSCULAR | Status: AC | PRN
  Administered 2014-07-27: 100 mL via INTRAVENOUS

## 2014-07-28 ENCOUNTER — Encounter: Payer: Self-pay | Admitting: Family Medicine

## 2014-07-28 ENCOUNTER — Ambulatory Visit (INDEPENDENT_AMBULATORY_CARE_PROVIDER_SITE_OTHER): Payer: Self-pay | Admitting: Family Medicine

## 2014-07-28 VITALS — BP 140/78 | HR 60 | Temp 98.7°F | Ht 75.0 in | Wt 184.0 lb

## 2014-07-28 DIAGNOSIS — M545 Low back pain, unspecified: Secondary | ICD-10-CM

## 2014-07-28 DIAGNOSIS — I10 Essential (primary) hypertension: Secondary | ICD-10-CM

## 2014-07-28 MED ORDER — HYDROCODONE-ACETAMINOPHEN 5-325 MG PO TABS: 1.0000 | ORAL_TABLET | Freq: Four times a day (QID) | ORAL | Status: DC | PRN

## 2014-07-28 MED ORDER — CYCLOBENZAPRINE HCL 10 MG PO TABS: ORAL_TABLET | ORAL | Status: DC

## 2014-07-28 NOTE — Progress Notes (Signed)
Patient ID: Leah [name], female   DOB: 11/25/1950, 64 y.o.   MRN: [id number]    Subjective: CC: Follow lower back pain   HPI: Patient is a 64 y.o. female with a past medical history of lower back pain presenting to clinic today for f/u on pain.  Lumbar back pain:  Pain currently 8/10 when it is at it's worse. Patient prescribed hydrocodone-APAP at last visit as it was refractory to tramadol. Patient not currently doing any exercises. Pain is  worse with movement/housework or prolonged sitting. No radicular pain. No urinary or bowel incontinence. No saddle paresthesias.  Hypertension: Blood pressure at home:doesn't take  Blood pressure today:140/78 Taking Meds: amlodipine and coreg 12.48m BID ROS: Denies dizziness, visual changes, nausea, vomiting, chest pain, abdominal pain. Has seen black spots in her vision in the AM occasionally, however not recently; wears glasses that she should wear constantly but she only wears them to read (has eye appt on 5/10)   Social History: 1/2ppd, not interested in information/discussing today however would like to know if there's a test that can look for lung cancer (pt has not met with financial assistance and would prefer to do this once she's covered).   Health Maintenance: due for colonoscopy   ROS: All other systems reviewed and are negative.  Past Medical History Patient Active Problem List   Diagnosis Date Noted  . Hematuria 07/01/2014  . Dysuria 05/24/2014  . Burning sensation of feet 02/01/2014  . Jaw pain 10/18/2012  . Low back pain 07/05/2012  . Insomnia 07/05/2012  . Peripheral vascular disease, unspecified 10/25/2011  . PVC (premature ventricular contraction) 10/02/2011  . Fatigue 09/04/2011  . Chest pain 09/04/2011  . History of angioedema 06/14/2011  . Atherosclerosis of native arteries of the extremities with intermittent claudication 06/04/2011  . TOBACCO ABUSE 06/12/2009  . Hyperlipidemia 09/28/2008  . Essential  hypertension, benign 09/28/2008  . Coronary atherosclerosis of native coronary artery 09/28/2008    Medications- reviewed and updated Current Outpatient Prescriptions  Medication Sig Dispense Refill  . amLODipine (NORVASC) 10 MG tablet Take 1 tablet (10 mg total) by mouth daily. 30 tablet 2  . aspirin 325 MG tablet Take 1 tablet (325 mg total) by mouth daily. 30 tablet 2  . atorvastatin (LIPITOR) 80 MG tablet Take 1 tablet (80 mg total) by mouth daily. 30 tablet 2  . carvedilol (COREG) 12.5 MG tablet Take 1 tablet (12.558m in the morning and 2 tables (2533min the evening. 90 tablet 1  . clopidogrel (PLAVIX) 75 MG tablet Take 1 tablet (75 mg total) by mouth daily. 30 tablet 2  . cyclobenzaprine (FLEXERIL) 10 MG tablet TAKE 1 TABLET BY MOUTH 3 TIMES DAILY AS NEEDED FOR MUSCLE SPASMS 60 tablet 0  . HYDROcodone-acetaminophen (NORCO/VICODIN) 5-325 MG per tablet Take 1 tablet by mouth every 6 (six) hours as needed for moderate pain. 45 tablet 0  . sulfamethoxazole-trimethoprim (BACTRIM DS,SEPTRA DS) 800-160 MG per tablet Take 1 tablet by mouth 2 (two) times daily. 6 tablet 0  . traMADol (ULTRAM) 50 MG tablet TAKE 1 TABLET BY MOUTH EVERY 8 HOURS AS NEEDED FOR SEVERE PAIN 60 tablet 0  . traZODone (DESYREL) 100 MG tablet Take 1 tablet (100 mg total) by mouth at bedtime as needed for sleep. 30 tablet 2  . [DISCONTINUED] buPROPion (WELLBUTRIN SR) 150 MG 12 hr tablet Take 1 tablet (150 mg total) by mouth 2 (two) times daily. 60 tablet 3  . [DISCONTINUED] simvastatin (ZOCOR) 40 MG tablet Take 40  mg by mouth at bedtime.      No current facility-administered medications for this visit.    Objective: Office vital signs reviewed. BP 140/78 mmHg  Pulse 60  Temp(Src) 98.7 F (37.1 C) (Oral)  Ht 6' 3"  (1.905 m)  Wt 184 lb (83.462 kg)  BMI 23.00 kg/m2   Physical Examination:  General: Awake, alert, well- nourished, NAD. The room smells strongly of cigarette smoke. Cardio: RRR, no m/r/g noted.  Pulm:  No increased WOB.  CTAB, without wheezes, rhonchi or crackles noted.  GI: soft, NT/ND,+BS x4, no hepatomegaly, no splenomegaly MSK: TTP over the scapula bilaterally. No TTP over the spinous processes or paraspinal muscles. Negative SLR. No abnormal curvature of the spine with bending over. 5/5 strength in the UE and LE bilaterally. Normal gait and station Neuro: DTRs 2/4  Assessment/Plan: Low back pain No red flags on exam or history (no radicular pain, neg SLR, no paresthesias, no TTP over the spinous processes, no bowel/bladder incontinence. - Discussed appropriate back exercises and given hand out - Discussed improvement in posture for thoracic pain  - Refilled hydrocodone-APAP 5-322m PRN pain  - at next appt, will refer to PT if patient has no improvement in home exercises.     Essential hypertension, benign Stable and improved from last visit. No side effects currently -Continue with current regimen     No orders of the defined types were placed in this encounter.    Meds ordered this encounter  Medications  . cyclobenzaprine (FLEXERIL) 10 MG tablet    Sig: TAKE 1 TABLET BY MOUTH 3 TIMES DAILY AS NEEDED FOR MUSCLE SPASMS    Dispense:  60 tablet    Refill:  0  . HYDROcodone-acetaminophen (NORCO/VICODIN) 5-325 MG per tablet    Sig: Take 1 tablet by mouth every 6 (six) hours as needed for moderate pain.    Dispense:  45 tablet    [place]

## 2014-07-28 NOTE — Patient Instructions (Signed)

## 2014-07-28 NOTE — Progress Notes (Signed)
Pt given number to call and schedule mammogram. Miko Sirico, Salome Spotted

## 2014-07-28 NOTE — Assessment & Plan Note (Signed)
Stable and improved from last visit. No side effects currently -Continue with current regimen

## 2014-07-28 NOTE — Assessment & Plan Note (Signed)
No red flags on exam or history (no radicular pain, neg SLR, no paresthesias, no TTP over the spinous processes, no bowel/bladder incontinence. - Discussed appropriate back exercises and given hand out - Discussed improvement in posture for thoracic pain  - Refilled hydrocodone-APAP 5-'325mg'$  PRN pain  - at next appt, will refer to PT if patient has no improvement in home exercises.

## 2014-08-15 ENCOUNTER — Ambulatory Visit: Payer: Self-pay

## 2014-08-19 ENCOUNTER — Telehealth: Payer: Self-pay | Admitting: Family Medicine

## 2014-08-19 NOTE — Telephone Encounter (Signed)
Needs refill on hydrocodone and flexril Will pick up at her appt wed with Deforest Hoyles

## 2014-08-22 ENCOUNTER — Encounter (HOSPITAL_COMMUNITY): Payer: Self-pay | Admitting: Nurse Practitioner

## 2014-08-22 ENCOUNTER — Inpatient Hospital Stay (HOSPITAL_COMMUNITY)
Admission: EM | Admit: 2014-08-22 | Discharge: 2014-08-25 | DRG: 287 | Disposition: A | Discharge status: Home / Self Care | Payer: Medicaid Other | Attending: Cardiology | Admitting: Cardiology

## 2014-08-22 ENCOUNTER — Emergency Department (HOSPITAL_COMMUNITY): Payer: Medicaid Other

## 2014-08-22 DIAGNOSIS — I739 Peripheral vascular disease, unspecified: Secondary | ICD-10-CM | POA: Diagnosis present

## 2014-08-22 DIAGNOSIS — I200 Unstable angina: Secondary | ICD-10-CM

## 2014-08-22 DIAGNOSIS — R011 Cardiac murmur, unspecified: Secondary | ICD-10-CM | POA: Diagnosis present

## 2014-08-22 DIAGNOSIS — I25110 Atherosclerotic heart disease of native coronary artery with unstable angina pectoris: Secondary | ICD-10-CM | POA: Diagnosis present

## 2014-08-22 DIAGNOSIS — F17210 Nicotine dependence, cigarettes, uncomplicated: Secondary | ICD-10-CM | POA: Diagnosis present

## 2014-08-22 DIAGNOSIS — R9389 Abnormal findings on diagnostic imaging of other specified body structures: Secondary | ICD-10-CM

## 2014-08-22 DIAGNOSIS — I97610 Postprocedural hemorrhage and hematoma of a circulatory system organ or structure following a cardiac catheterization: Secondary | ICD-10-CM | POA: Diagnosis not present

## 2014-08-22 DIAGNOSIS — I493 Ventricular premature depolarization: Secondary | ICD-10-CM | POA: Diagnosis present

## 2014-08-22 DIAGNOSIS — I10 Essential (primary) hypertension: Secondary | ICD-10-CM | POA: Diagnosis present

## 2014-08-22 DIAGNOSIS — I2510 Atherosclerotic heart disease of native coronary artery without angina pectoris: Secondary | ICD-10-CM

## 2014-08-22 DIAGNOSIS — T82857A Stenosis of cardiac prosthetic devices, implants and grafts, initial encounter: Secondary | ICD-10-CM | POA: Diagnosis present

## 2014-08-22 DIAGNOSIS — Z7982 Long term (current) use of aspirin: Secondary | ICD-10-CM

## 2014-08-22 DIAGNOSIS — R079 Chest pain, unspecified: Secondary | ICD-10-CM

## 2014-08-22 DIAGNOSIS — Z9861 Coronary angioplasty status: Secondary | ICD-10-CM

## 2014-08-22 DIAGNOSIS — E785 Hyperlipidemia, unspecified: Secondary | ICD-10-CM | POA: Diagnosis present

## 2014-08-22 DIAGNOSIS — E876 Hypokalemia: Secondary | ICD-10-CM | POA: Diagnosis present

## 2014-08-22 DIAGNOSIS — I340 Nonrheumatic mitral (valve) insufficiency: Secondary | ICD-10-CM | POA: Diagnosis present

## 2014-08-22 DIAGNOSIS — I209 Angina pectoris, unspecified: Secondary | ICD-10-CM

## 2014-08-22 HISTORY — DX: Reserved for inherently not codable concepts without codable children: IMO0001

## 2014-08-22 LAB — CBC
HCT: 47 % — ABNORMAL HIGH (ref 36.0–46.0)
Hemoglobin: 15.7 g/dL — ABNORMAL HIGH (ref 12.0–15.0)
MCH: 27 pg (ref 26.0–34.0)
MCHC: 33.4 g/dL (ref 30.0–36.0)
MCV: 80.9 fL (ref 78.0–100.0)
Platelets: 155 10*3/uL (ref 150–400)
RBC: 5.81 MIL/uL — ABNORMAL HIGH (ref 3.87–5.11)
RDW: 14.9 % (ref 11.5–15.5)
WBC: 11.5 10*3/uL — AB (ref 4.0–10.5)

## 2014-08-22 LAB — HEPARIN LEVEL (UNFRACTIONATED)
HEPARIN UNFRACTIONATED: 1.84 [IU]/mL — AB (ref 0.30–0.70)
Heparin Unfractionated: 1.07 IU/mL — ABNORMAL HIGH (ref 0.30–0.70)

## 2014-08-22 LAB — BASIC METABOLIC PANEL
ANION GAP: 13 (ref 5–15)
BUN: 10 mg/dL (ref 6–20)
CHLORIDE: 105 mmol/L (ref 101–111)
CO2: 25 mmol/L (ref 22–32)
Calcium: 9.7 mg/dL (ref 8.9–10.3)
Creatinine, Ser: 0.96 mg/dL (ref 0.44–1.00)
GFR calc Af Amer: >60 mL/min (ref >60)
GFR calc non Af Amer: >60 mL/min (ref >60)
GLUCOSE: 134 mg/dL — AB (ref 65–99)
Potassium: 3.1 mmol/L — ABNORMAL LOW (ref 3.5–5.1)
SODIUM: 143 mmol/L (ref 135–145)

## 2014-08-22 LAB — MRSA PCR SCREENING: MRSA by PCR: NEGATIVE

## 2014-08-22 LAB — I-STAT TROPONIN, ED: TROPONIN I, POC: 0.01 ng/mL (ref 0.00–0.08)

## 2014-08-22 MED ORDER — AMLODIPINE BESYLATE 10 MG PO TABS
10.0000 mg | ORAL_TABLET | Freq: Every day | ORAL | Status: DC
  Administered 2014-08-23 – 2014-08-25 (×3): 10 mg via ORAL
  Filled 2014-08-22 (×4): qty 1

## 2014-08-22 MED ORDER — TRAZODONE HCL 100 MG PO TABS: 100.0000 mg | ORAL_TABLET | Freq: Every evening | ORAL | Status: DC | PRN

## 2014-08-22 MED ORDER — CARVEDILOL 12.5 MG PO TABS
12.5000 mg | ORAL_TABLET | Freq: Two times a day (BID) | ORAL | Status: DC
  Administered 2014-08-22 – 2014-08-25 (×6): 12.5 mg via ORAL
  Filled 2014-08-22: qty 2
  Filled 2014-08-22 (×5): qty 1

## 2014-08-22 MED ORDER — HEPARIN (PORCINE) IN NACL 100-0.45 UNIT/ML-% IJ SOLN
1150.0000 [IU]/h | INTRAMUSCULAR | Status: DC
  Administered 2014-08-22: 1150 [IU]/h via INTRAVENOUS
  Filled 2014-08-22: qty 250

## 2014-08-22 MED ORDER — NITROGLYCERIN IN D5W 200-5 MCG/ML-% IV SOLN
0.0000 ug/min | INTRAVENOUS | Status: DC
  Administered 2014-08-22: 5 ug/min via INTRAVENOUS
  Filled 2014-08-22: qty 250

## 2014-08-22 MED ORDER — CLOPIDOGREL BISULFATE 75 MG PO TABS
75.0000 mg | ORAL_TABLET | Freq: Every day | ORAL | Status: DC
Start: 2014-08-22 — End: 2014-08-25
  Administered 2014-08-23 – 2014-08-25 (×3): 75 mg via ORAL
  Filled 2014-08-22 (×4): qty 1

## 2014-08-22 MED ORDER — ASPIRIN 325 MG PO TABS
325.0000 mg | ORAL_TABLET | Freq: Every day | ORAL | Status: DC
  Administered 2014-08-23 – 2014-08-25 (×3): 325 mg via ORAL
  Filled 2014-08-22 (×4): qty 1

## 2014-08-22 MED ORDER — HEPARIN BOLUS VIA INFUSION
4000.0000 [IU] | Freq: Once | INTRAVENOUS | Status: AC
  Administered 2014-08-22: 4000 [IU] via INTRAVENOUS
  Filled 2014-08-22: qty 4000

## 2014-08-22 MED ORDER — ATORVASTATIN CALCIUM 80 MG PO TABS
80.0000 mg | ORAL_TABLET | Freq: Every day | ORAL | Status: DC
  Administered 2014-08-22 – 2014-08-24 (×3): 80 mg via ORAL
  Filled 2014-08-22 (×3): qty 1

## 2014-08-22 NOTE — Progress Notes (Signed)
ANTICOAGULATION CONSULT NOTE - Initial Consult  Pharmacy Consult for Heparin Indication: chest pain/ACS  Allergies  Allergen Reactions  . Lisinopril Swelling    Angioedema 06/10/11  . Aspirin Other (See Comments)    Upset stomach  . Chantix [Varenicline]     insomnia  . Penicillins Hives    Patient Measurements: Height: 6' 2.5" (189.2 cm) Weight: 182 lb 8 oz (82.781 kg) IBW/kg (Calculated) : 78.85 Heparin Dosing Weight:  83 kg  Vital Signs: Temp: 98.2 F (36.8 C) (05/30 2008) Temp Source: Oral (05/30 2008) BP: 127/58 mmHg (05/30 2008) Pulse Rate: 69 (05/30 2008)  Labs:  Recent Labs  08/22/14 1140 08/22/14 2115  HGB 15.7*  --   HCT 47.0*  --   PLT 155  --   HEPARINUNFRC  --  1.84*  CREATININE 0.96  --     Estimated Creatinine Clearance: 76.7 mL/min (by C-G formula based on Cr of 0.96).   Medical History: Past Medical History  Diagnosis Date  . Hyperlipidemia   . Hypertension   . GERD (gastroesophageal reflux disease)   . Leg pain   . Headache(784.0)   . Carotid artery occlusion   . COPD (chronic obstructive pulmonary disease)   . CAD (coronary artery disease)   . Peripheral vascular disease   . Tobacco abuse     Medications:  See med rec  Assessment: 64 y/o M with significant past cardiac history presents with CP, back/jaw pain, and L hand numbness. Initial troponin negative. ECG showing frequent PVCs, no ST-T wave changes. K=3.1. Hgb 15.7. Plts 155. Start IV heparin for h/o MI  Initial HL is supratherapeutic at 1.84 on heparin 1150 units/hr. Nurse reports no issues with infusion or bleeding and verifies weight. Will order STAT HL to make sure level is accurate. Repeat HL remains > 1.0. Will hold for 1 hour and reduce dose.  Goal of Therapy:  Heparin level 0.3-0.7 units/ml Monitor platelets by anticoagulation protocol: Yes   Plan:  Hold heparin for 1 hour Decrease heparin to 900 units/hr Heparin level in 6 hrs Daily heparin level and CBC Cath  Tues/Wed  Andrey Cota. Diona Foley, PharmD Clinical Pharmacist Pager [phone] 08/22/2014,10:57 PM

## 2014-08-22 NOTE — Progress Notes (Signed)
ANTICOAGULATION CONSULT NOTE - Initial Consult  Pharmacy Consult for Heparin Indication: chest pain/ACS  Allergies  Allergen Reactions  . Lisinopril Swelling    Angioedema 06/10/11  . Aspirin Other (See Comments)    Upset stomach  . Chantix [Varenicline]     insomnia  . Penicillins Hives    Patient Measurements: Height: 6' 2.5" (189.2 cm) Weight: 184 lb (83.462 kg) IBW/kg (Calculated) : 78.85 Heparin Dosing Weight:  83.5 kg  Vital Signs: Temp: 97.4 F (36.3 C) (05/30 1129) Temp Source: Oral (05/30 1129) BP: 150/65 mmHg (05/30 1445) Pulse Rate: 66 (05/30 1445)  Labs:  Recent Labs  08/22/14 1140  HGB 15.7*  HCT 47.0*  PLT 155  CREATININE 0.96    Estimated Creatinine Clearance: 76.7 mL/min (by C-G formula based on Cr of 0.96).   Medical History: Past Medical History  Diagnosis Date  . Hyperlipidemia   . Hypertension   . GERD (gastroesophageal reflux disease)   . Leg pain   . Headache(784.0)   . Carotid artery occlusion   . COPD (chronic obstructive pulmonary disease)   . CAD (coronary artery disease)   . Peripheral vascular disease   . Tobacco abuse     Medications:  See med rec  Assessment: 64 y/o M with significant past cardiac history presents with CP, back/jaw pain, and L hand numbness. Initial troponin negative. ECG showing frequent PVCs, no ST-T wave changes. K=3.1. Hgb 15.7. Plts 155. Start IV heparin for h/o MI  Goal of Therapy:  Heparin level 0.3-0.7 units/ml Monitor platelets by anticoagulation protocol: Yes   Plan:  Cath Tues/Wed. Heparin 4000 unit IV bolus Heparin infusion 1150 units/hr Heparin level in 6-8 hrs Daily heparin level and cBC  Selwyn Reason, [employer] 08/22/2014,3:00 PM

## 2014-08-22 NOTE — ED Provider Notes (Signed)
CSN: [id number]     Arrival date & time 08/22/14  1123 History   First MD Initiated Contact with Patient 08/22/14 1128     Chief Complaint  Patient presents with  . Chest Pain     (Consider location/radiation/quality/duration/timing/severity/associated sxs/prior Treatment) HPI Comments: 64 year old female with complaints of left-sided chest discomfort. Patient states she's had this for approximately 24 hours intermittently. No identifying inciting factors. Associated with this pain she has some tingling in her left fingers as well as jaw. Does also endorse some shortness of breath. Has a history of ACS status Marlowe Lawes stent placementwith pain   Patient is a 64 y.o. female presenting with chest pain.  Chest Pain Pain location:  L chest Pain quality: dull   Pain radiates to:  Does not radiate Pain radiates to the back: no   Pain severity:  Moderate Onset quality:  Unable to specify Timing:  Intermittent Progression:  Waxing and waning Chronicity:  New Relieved by:  Rest Worsened by:  Nothing tried Associated symptoms: shortness of breath   Associated symptoms: no abdominal pain, no back pain, no fever and no headache     Past Medical History  Diagnosis Date  . Hyperlipidemia   . Hypertension   . GERD (gastroesophageal reflux disease)   . Leg pain   . Headache(784.0)   . Carotid artery occlusion   . COPD (chronic obstructive pulmonary disease)   . CAD (coronary artery disease)   . Peripheral vascular disease   . Tobacco abuse    Past Surgical History  Procedure Laterality Date  . Carotid endarterectomy  11/21/2007    left  . Angioplasty / stenting iliac  2010    right external iliac by Dr. Irish Lack  . Coronary angioplasty with stent placement  6/09    LAD 2.5x12 Promus  . Femoral-popliteal bypass graft  10/12    left Dr. Kellie Simmering  . Abdominal hysterectomy    . Cardiac catheterization  11/13/11    Left Heart Cath. with Coronary Angiogram   Family History  Problem  Relation Age of Onset  . Cancer Mother     BRAIN AND LUNG  . Hypertension Father   . Heart disease Father    History  Substance Use Topics  . Smoking status: Current Every Day Smoker -- 0.50 packs/day for 35 years    Types: Cigarettes  . Smokeless tobacco: Never Used     Comment: has not started wellbutrin yet  . Alcohol Use: No   OB History    No data available     Review of Systems  Constitutional: Negative for fever.  HENT: Negative for congestion.   Respiratory: Positive for shortness of breath.   Cardiovascular: Positive for chest pain.  Gastrointestinal: Negative for abdominal pain.  Musculoskeletal: Negative for back pain.  Skin: Negative for rash.  Neurological: Negative for headaches.  Psychiatric/Behavioral: Negative for confusion.  All other systems reviewed and are negative.     Allergies  Lisinopril; Aspirin; Chantix; and Penicillins  Home Medications   Prior to Admission medications   Medication Sig Start Date End Date Taking? Authorizing Provider  amLODipine (NORVASC) 10 MG tablet Take 1 tablet (10 mg total) by mouth daily. 05/24/14  Yes Archie Patten, MD  aspirin 325 MG tablet Take 1 tablet (325 mg total) by mouth daily. 05/24/14  Yes Archie Patten, MD  atorvastatin (LIPITOR) 80 MG tablet Take 1 tablet (80 mg total) by mouth daily. 05/24/14  Yes Archie Patten, MD  carvedilol (COREG)  12.5 MG tablet Take 1 tablet (12.'5mg'$ ) in the morning and 2 tables ('25mg'$ ) in the evening. 07/01/14  Yes Archie Patten, MD  clopidogrel (PLAVIX) 75 MG tablet Take 1 tablet (75 mg total) by mouth daily. 05/24/14  Yes Archie Patten, MD  traZODone (DESYREL) 100 MG tablet Take 1 tablet (100 mg total) by mouth at bedtime as needed for sleep. 05/24/14  Yes Crystal Libby Maw, MD  cyclobenzaprine (FLEXERIL) 10 MG tablet TAKE 1 TABLET BY MOUTH 3 TIMES DAILY AS NEEDED FOR MUSCLE SPASMS Patient not taking: Reported on 08/22/2014 07/28/14   Archie Patten, MD  HYDROcodone-acetaminophen  (NORCO/VICODIN) 5-325 MG per tablet Take 1 tablet by mouth every 6 (six) hours as needed for moderate pain. Patient not taking: Reported on 08/22/2014 07/28/14   Archie Patten, MD  sulfamethoxazole-trimethoprim (BACTRIM DS,SEPTRA DS) 800-160 MG per tablet Take 1 tablet by mouth 2 (two) times daily. Patient not taking: Reported on 08/22/2014 05/24/14   Archie Patten, MD  traMADol (ULTRAM) 50 MG tablet TAKE 1 TABLET BY MOUTH EVERY 8 HOURS AS NEEDED FOR SEVERE PAIN Patient not taking: Reported on 08/22/2014 06/22/14   Archie Patten, MD   BP 145/69 mmHg  Pulse 58  Temp(Src) 97.4 F (36.3 C) (Oral)  Resp 11  Ht 6' 2.5" (1.892 m)  Wt 184 lb (83.462 kg)  BMI 23.32 kg/m2  SpO2 96% Physical Exam  Constitutional: She is oriented to person, place, and time. She appears well-developed.  HENT:  Head: Normocephalic.  Eyes: Pupils are equal, round, and reactive to light.  Neck: Normal range of motion.  Cardiovascular:  Murmur heard. Pulmonary/Chest: Effort normal. No respiratory distress.  Abdominal: Soft. She exhibits no distension. There is no tenderness.  Musculoskeletal:  Trace bilateral pedal edema   Neurological: She is alert and oriented to person, place, and time. No cranial nerve deficit. She exhibits normal muscle tone.  Skin: Skin is warm.  Psychiatric: She has a normal mood and affect.  Vitals reviewed.   ED Course  Procedures (including critical care time) Labs Review Labs Reviewed  CBC - Abnormal; Notable for the following:    WBC 11.5 (*)    RBC 5.81 (*)    Hemoglobin 15.7 (*)    HCT 47.0 (*)    All other components within normal limits  BASIC METABOLIC PANEL  I-STAT TROPOININ, ED    Imaging Review Dg Chest 2 View  08/22/2014   CLINICAL DATA:  Pt states she has been having upper left back pain radiating into left arm and left chest for 1 week, recently pain has traveled into left side of jaw and she is now having some numbness in the left side of her lips, hx of  htn, copd, cad, current smoker x 35 years  EXAM: CHEST  2 VIEW  COMPARISON:  10/10/2012  FINDINGS: 2 cm round opacity projects over the upper left upper lung zone on the frontal view with another more subtle similar size opacity projecting in the right upper lung zone on the frontal view. Neither is visualized on the lateral view. Both appear new.  Lungs are hyperexpanded but otherwise clear. No pleural effusion or pneumothorax.  Cardiac silhouette is normal in size. No mediastinal or hilar masses or evidence of adenopathy.  Bony thorax is grossly intact.  IMPRESSION: 1. No acute cardiopulmonary disease. 2. Single nodular opacities project in each upper lung zone on the frontal view only. These are of unclear etiology and are not seen on the  lateral view. It could reflect superimposed shadows from a abnormalities on top of or below the patient. Recommend either follow-up AP lordotic view of the chest or follow-up chest CT for further evaluation. Chest CT would be the most conclusive exam.   Electronically Signed   By: Lajean Manes M.D.   On: 08/22/2014 12:46     EKG Interpretation   Date/Time:  Monday Aug 22 2014 11:28:29 EDT Ventricular Rate:  88 PR Interval:  170 QRS Duration: 72 QT Interval:  358 QTC Calculation: 433 R Axis:   88 Text Interpretation:  Sinus rhythm with Premature supraventricular  complexes Biatrial enlargement Septal infarct , age undetermined No  significant change since last tracing Confirmed by Maryan Rued  MD, Loree Fee  [phone]) on 08/22/2014 11:35:37 AM      MDM  64 year old female with complaints of chest pain. Patient with significant vascular history. This includes MI and peripheral vascular disease. Initial EKG without any acute ischemic changes. Initial troponin negative. CBC unremarkable. Chest x-ray without wide mediastinum pneumothorax or other serious. Patient does state previous MI presents similar fashion. Discussed with cardiology who will admit for cardiac rule  out.   Final diagnoses:  Chest pain, unspecified chest pain type        Robynn Pane, MD 08/22/14 [place], MD 08/24/14 2217

## 2014-08-22 NOTE — ED Notes (Signed)
Patient transported to X-ray 

## 2014-08-22 NOTE — H&P (Signed)
Patient ID: Leah [name] MRN: [id number], DOB/AGE: April 08, 1950   Admit date: 08/22/2014   Primary Physician: Archie Patten, MD Primary Cardiologist: Dr Martinique  Pt. Profile:  Chest pain  Problem List  Past Medical History  Diagnosis Date  . Hyperlipidemia   . Hypertension   . GERD (gastroesophageal reflux disease)   . Leg pain   . Headache(784.0)   . Carotid artery occlusion   . COPD (chronic obstructive pulmonary disease)   . CAD (coronary artery disease)   . Peripheral vascular disease   . Tobacco abuse     Past Surgical History  Procedure Laterality Date  . Carotid endarterectomy  11/21/2007    left  . Angioplasty / stenting iliac  2010    right external iliac by Dr. Irish Lack  . Coronary angioplasty with stent placement  6/09    LAD 2.5x12 Promus  . Femoral-popliteal bypass graft  10/12    left Dr. Kellie Simmering  . Abdominal hysterectomy    . Cardiac catheterization  11/13/11    Left Heart Cath. with Coronary Angiogram    Allergies  Allergies  Allergen Reactions  . Lisinopril Swelling    Angioedema 06/10/11  . Aspirin Other (See Comments)    Upset stomach  . Chantix [Varenicline]     insomnia  . Penicillins Hives   HPI  Patient is a 64 y.o. female with a PMHx of CAD, s/p PCI to LAD in 2009 who presents to the ER with a typical exertional chest pain. She has started to notice left arm numbness about a week ago, followed by back pain yesterday, jaw pain today and chest pain that got worse on exertion.  This is similar to her presentation prior to the stent placement in 2009. The patient is followed in the clinic by Dr Martinique, She is S/P PCI/ stenting of the mid LAD in June of 2009 with a 2.5 x 12 mm Promus stent. She is also status post left carotid endarterectomy in August of 2009. She is status post right external iliac stenting and bilateral renal artery stenting in 2009. In October of 2012 she underwent left femoropopliteal bypass grafting by Dr.  Kellie Simmering. In August 2013 she had some chest pain. A Myoview study showed mild apical ischemia. She had repeat cardiac catheterization which demonstrated a 50% stenosis in the LAD at the distal stent margin. Otherwise nonobstructive disease. She has been managed medically.   She denies resting SOB, but has DOE, no palpitations or syncope.   Home Medications  Prior to Admission medications   Medication Sig Start Date End Date Taking? Authorizing Provider  amLODipine (NORVASC) 10 MG tablet Take 1 tablet (10 mg total) by mouth daily. 05/24/14  Yes Archie Patten, MD  aspirin 325 MG tablet Take 1 tablet (325 mg total) by mouth daily. 05/24/14  Yes Archie Patten, MD  atorvastatin (LIPITOR) 80 MG tablet Take 1 tablet (80 mg total) by mouth daily. 05/24/14  Yes Archie Patten, MD  carvedilol (COREG) 12.5 MG tablet Take 1 tablet (12.'5mg'$ ) in the morning and 2 tables ('25mg'$ ) in the evening. 07/01/14  Yes Archie Patten, MD  clopidogrel (PLAVIX) 75 MG tablet Take 1 tablet (75 mg total) by mouth daily. 05/24/14  Yes Archie Patten, MD  traZODone (DESYREL) 100 MG tablet Take 1 tablet (100 mg total) by mouth at bedtime as needed for sleep. 05/24/14  Yes Archie Patten, MD  cyclobenzaprine (FLEXERIL) 10 MG tablet TAKE 1 TABLET BY MOUTH  3 TIMES DAILY AS NEEDED FOR MUSCLE SPASMS Patient not taking: Reported on 08/22/2014 07/28/14   Archie Patten, MD  HYDROcodone-acetaminophen (NORCO/VICODIN) 5-325 MG per tablet Take 1 tablet by mouth every 6 (six) hours as needed for moderate pain. Patient not taking: Reported on 08/22/2014 07/28/14   Archie Patten, MD  sulfamethoxazole-trimethoprim (BACTRIM DS,SEPTRA DS) 800-160 MG per tablet Take 1 tablet by mouth 2 (two) times daily. Patient not taking: Reported on 08/22/2014 05/24/14   Archie Patten, MD  traMADol (ULTRAM) 50 MG tablet TAKE 1 TABLET BY MOUTH EVERY 8 HOURS AS NEEDED FOR SEVERE PAIN Patient not taking: Reported on 08/22/2014 06/22/14   Archie Patten, MD    Family History  Family History  Problem Relation Age of Onset  . Cancer Mother     BRAIN AND LUNG  . Hypertension Father   . Heart disease Father    Social History  History   Social History  . Marital Status: Divorced    Spouse Name: N/A  . Number of Children: 4  . Years of Education: N/A   Occupational History  .  Lab [employer]   Social History Main Topics  . Smoking status: Current Every Day Smoker -- 0.50 packs/day for 35 years    Types: Cigarettes  . Smokeless tobacco: Never Used     Comment: has not started wellbutrin yet  . Alcohol Use: No  . Drug Use: No  . Sexual Activity: Not on file   Other Topics Concern  . Not on file   Social History Narrative    Review of Systems General:  No chills, fever, night sweats or weight changes.  Cardiovascular:  No chest pain, dyspnea on exertion, edema, orthopnea, palpitations, paroxysmal nocturnal dyspnea. Dermatological: No rash, lesions/masses Respiratory: No cough, dyspnea Urologic: No hematuria, dysuria Abdominal:   No nausea, vomiting, diarrhea, bright red blood per rectum, melena, or hematemesis Neurologic:  No visual changes, wkns, changes in mental status. All other systems reviewed and are otherwise negative except as noted above.  Physical Exam  Blood pressure 145/69, pulse 58, temperature 97.4 F (36.3 C), temperature source Oral, resp. rate 11, height 6' 2.5" (1.892 m), weight 184 lb (83.462 kg), SpO2 96 %.  General: Pleasant, NAD Psych: Normal affect. Neuro: Alert and oriented X 3. Moves all extremities spontaneously. HEENT: Normal  Neck: Supple without bruits or JVD. Lungs:  Resp regular and unlabored, CTA. Heart: RRR no s3, s4, 2/6 systolic murmur. Abdomen: Soft, non-tender, non-distended, BS + x 4.  Extremities: No clubbing, cyanosis or edema. DP/PT/Radials 2+ and equal bilaterally.  Labs  No results for input(s): CKTOTAL, CKMB, TROPONINI in the last 72 hours. Lab Results  Component Value Date    WBC 11.5* 08/22/2014   HGB 15.7* 08/22/2014   HCT 47.0* 08/22/2014   MCV 80.9 08/22/2014   PLT 155 08/22/2014   No results for input(s): NA, K, CL, CO2, BUN, CREATININE, CALCIUM, PROT, BILITOT, ALKPHOS, ALT, AST, GLUCOSE in the last 168 hours.  Invalid input(s): LABALBU Lab Results  Component Value Date   CHOL 250* 02/28/2014   HDL 42 02/28/2014   LDLCALC 189* 02/28/2014   TRIG 97 02/28/2014   Radiology/Studies  Dg Chest 2 View  08/22/2014   CLINICAL DATA:  Pt states she has been having upper left back pain radiating into left arm and left chest for 1 week, recently pain has traveled into left side of jaw and she is now having some numbness in the left side of  her lips, hx of htn, copd, cad, current smoker x 35 years    IMPRESSION: 1. No acute cardiopulmonary disease. 2. Single nodular opacities project in each upper lung zone on the frontal view only. These are of unclear etiology and are not seen on the lateral view. It could reflect superimposed shadows from a abnormalities on top of or below the patient. Recommend either follow-up AP lordotic view of the chest or follow-up chest CT for further evaluation. Chest CT would be the most conclusive exam.   Echocardiogram - none  ECG: SR, frequent PVC, no new ST-T wave changes    ASSESSMENT AND PLAN  1. Unstable angina - accelerated chest pain on exertion now at rest associated with back, jaw pain and left hand numbness. The first troponin is negative, ECG showing frequent PVCs, no ST-T wave changes - we will start iv Heparin - continue asa 81 mg po daily, plavix daily, carvedilol, high dose of atorvastatin 80 mg po daily - as the cath lab is too busy tomorrow, we will place her on the schedule, but most probably will be done on Wednesday  - order echocardiogram  2. Hypertension - for now we will start in NTG, adjust home meds as needed  3. Hyperlipidemia - LDL 189 on 80 mg of atorvastatin - we will repeat, if still elevated she  might be considered for PCSK 9 inhibitors   DVT PPX - Heparin iv   Signed, Dorothy Spark, MD, [facility] 08/22/2014, 1:42 PM

## 2014-08-22 NOTE — ED Notes (Signed)
Pt c/o L shoulder pain and numbness x 1 week. Today the pain and numbness began to radiate into her jaw and fingers so she decided to seek treatment. Nothing relieves the pain. Lying on the L shoulder increases the pain. A&Ox4, resp e/u

## 2014-08-23 ENCOUNTER — Encounter (HOSPITAL_COMMUNITY): Payer: Self-pay | Admitting: General Practice

## 2014-08-23 LAB — CBC
HEMATOCRIT: 39.7 % (ref 36.0–46.0)
HEMOGLOBIN: 12.7 g/dL (ref 12.0–15.0)
MCH: 25.6 pg — ABNORMAL LOW (ref 26.0–34.0)
MCHC: 32 g/dL (ref 30.0–36.0)
MCV: 79.9 fL (ref 78.0–100.0)
Platelets: 128 10*3/uL — ABNORMAL LOW (ref 150–400)
RBC: 4.97 MIL/uL (ref 3.87–5.11)
RDW: 14.8 % (ref 11.5–15.5)
WBC: 12 10*3/uL — AB (ref 4.0–10.5)

## 2014-08-23 LAB — HEPARIN LEVEL (UNFRACTIONATED)
Heparin Unfractionated: 0.7 IU/mL (ref 0.30–0.70)
Heparin Unfractionated: 0.56 IU/mL (ref 0.30–0.70)

## 2014-08-23 LAB — TROPONIN I: Troponin I: <0.03 ng/mL (ref <0.031)

## 2014-08-23 LAB — POTASSIUM: Potassium: 3 mmol/L — ABNORMAL LOW (ref 3.5–5.1)

## 2014-08-23 MED ORDER — POTASSIUM CHLORIDE CRYS ER 20 MEQ PO TBCR
40.0000 meq | EXTENDED_RELEASE_TABLET | ORAL | Status: AC
  Administered 2014-08-23 – 2014-08-24 (×2): 40 meq via ORAL
  Filled 2014-08-23 (×2): qty 2

## 2014-08-23 MED ORDER — SODIUM CHLORIDE 0.9 % IJ SOLN: 3.0000 mL | Freq: Two times a day (BID) | INTRAMUSCULAR | Status: DC

## 2014-08-23 MED ORDER — POTASSIUM CHLORIDE CRYS ER 20 MEQ PO TBCR
40.0000 meq | EXTENDED_RELEASE_TABLET | Freq: Once | ORAL | Status: AC
  Administered 2014-08-23: 40 meq via ORAL
  Filled 2014-08-23: qty 2

## 2014-08-23 MED ORDER — SODIUM CHLORIDE 0.9 % IJ SOLN: 3.0000 mL | INTRAMUSCULAR | Status: DC | PRN

## 2014-08-23 MED ORDER — SODIUM CHLORIDE 0.9 % IV SOLN: 250.0000 mL | INTRAVENOUS | Status: DC | PRN

## 2014-08-23 MED ORDER — SODIUM CHLORIDE 0.9 % IV SOLN
INTRAVENOUS | Status: DC
  Administered 2014-08-24: 05:00:00 via INTRAVENOUS

## 2014-08-23 MED ORDER — HEPARIN (PORCINE) IN NACL 100-0.45 UNIT/ML-% IJ SOLN
900.0000 [IU]/h | INTRAMUSCULAR | Status: DC
  Administered 2014-08-23: 900 [IU]/h via INTRAVENOUS
  Filled 2014-08-23: qty 250

## 2014-08-23 NOTE — Progress Notes (Signed)
Subjective: She reports 5/10 left arm ache.   Objective: Vital signs in last 24 hours: Temp:  [97.4 F (36.3 C)-98.3 F (36.8 C)] 97.9 F (36.6 C) (05/31 0750) Pulse Rate:  [56-74] 59 (05/31 0750) Resp:  [9-18] 18 (05/31 0750) BP: (126-164)/(58-101) 139/62 mmHg (05/31 0750) SpO2:  [92 %-100 %] 95 % (05/31 0750) Weight:  [182 lb 8 oz (82.781 kg)-186 lb 6.4 oz (84.55 kg)] 186 lb 6.4 oz (84.55 kg) (05/31 0511) Last BM Date: 08/22/14  Intake/Output from previous day: 05/30 0701 - 05/31 0700 In: 38.2 [I.V.:38.2] Out: -  Intake/Output this shift:    Medications Scheduled Meds: . amLODipine  10 mg Oral Daily  . aspirin  325 mg Oral Daily  . atorvastatin  80 mg Oral q1800  . carvedilol  12.5 mg Oral BID WC  . clopidogrel  75 mg Oral Daily   Continuous Infusions: . heparin 900 Units/hr (08/23/14 0128)  . nitroGLYCERIN 10 mcg/min (08/22/14 2045)   PRN Meds:.traZODone  PE: General appearance: alert, cooperative and no distress Lungs: Decreased BS throughout Heart: regular rate and rhythm, S1, S2 normal, click, rub or gallop Abdomen: +BS, nontender.  Left abd bruit Extremities: No LEE Pulses: 2+ radials Skin: Warm and dry Neurologic: grossly normal  Lab Results:   Recent Labs  08/22/14 1140 08/23/14 0250  WBC 11.5* 12.0*  HGB 15.7* 12.7  HCT 47.0* 39.7  PLT 155 128*   BMET  Recent Labs  08/22/14 1140  NA 143  K 3.1*  CL 105  CO2 25  GLUCOSE 134*  BUN 10  CREATININE 0.96  CALCIUM 9.7   Lipid Panel     Component Value Date/Time   CHOL 250* 02/28/2014 1540   TRIG 97 02/28/2014 1540   HDL 42 02/28/2014 1540   CHOLHDL 6.0 02/28/2014 1540   VLDL 19 02/28/2014 1540   LDLCALC 189* 02/28/2014 1540   LDLDIRECT 185* 09/09/2011 0820      Assessment/Plan  1. Unstable angina -  accelerated chest pain on exertion now at rest associated with back, jaw pain and left hand numbness. The first troponin is negative, ECG showing frequent PVCs, no ST-T wave  changes - IV Heparin - continue asa 81 mg po daily, plavix daily, carvedilol, high dose of atorvastatin 80 mg po daily - as the cath lab is too busy today, we will place her on the schedule, but most probably will be done on Wednesday  - order echocardiogram  She continues to have arm pain/numbness.  Increase IV NTG.  Troponin not cycled.  Check now.   2. Hypertension  IV NTG, adjust home meds as needed, amlodipine 10 in addition to above.  3. Hyperlipidemia -  LDL 189(08/2011) on 80 mg of atorvastatin Lipids pending.  If still elevated, she might be considered for PCSK 9 inhibitors  4. Hypokalemia  Replace and recheck   LOS: 1 day    HAGER, BRYAN PA-C 08/23/2014 8:06 AM  Patient seen, examined. Available data reviewed. Agree with findings, assessment, and plan as outlined by Tarri Fuller, PA-C. The patient is independently interviewed and examined. Exam is pertinent for an alert, oriented woman in no distress. Lungs are clear. Heart is regular rhythm with 2/6 ejection murmur at the right upper sternal border. There is no peripheral edema. Plans noted for cardiac catheterization today. I reviewed the risks, indications, and alternatives to cardiac catheterization with possible angioplasty and stenting. She has been to the procedure performed. She's apparently had problems with radial access  in the past. All of her questions were answered today. Further disposition pending cardiac cath results.  Sherren Mocha, M.D. 08/23/2014 1:34 PM

## 2014-08-23 NOTE — Progress Notes (Signed)
ANTICOAGULATION CONSULT NOTE - Follow Up Consult  Pharmacy Consult for heparin Indication: chest pain/ACS  Allergies  Allergen Reactions  . Lisinopril Swelling    Angioedema 06/10/11  . Aspirin Other (See Comments)    Upset stomach  . Chantix [Varenicline]     insomnia  . Penicillins Hives    Patient Measurements: Height: 6' 2.5" (189.2 cm) Weight: 186 lb 6.4 oz (84.55 kg) IBW/kg (Calculated) : 78.85 Heparin Dosing Weight: 83 kg  Vital Signs: Temp: 98.6 F (37 C) (05/31 1542) Temp Source: Oral (05/31 1542) BP: 142/62 mmHg (05/31 1542) Pulse Rate: 66 (05/31 1542)  Labs:  Recent Labs  08/22/14 1140  08/22/14 2330 08/23/14 0250 08/23/14 0850 08/23/14 1015 08/23/14 1630  HGB 15.7*  --   --  12.7  --   --   --   HCT 47.0*  --   --  39.7  --   --   --   PLT 155  --   --  128*  --   --   --   HEPARINUNFRC  --   < > 1.07*  --  0.70  --  0.56  CREATININE 0.96  --   --   --   --   --   --   TROPONINI  --   --   --   --   --  <0.03  --   < > = values in this interval not displayed.  Estimated Creatinine Clearance: 76.7 mL/min (by C-G formula based on Cr of 0.96).   Medications:  Scheduled:  . amLODipine  10 mg Oral Daily  . aspirin  325 mg Oral Daily  . atorvastatin  80 mg Oral q1800  . carvedilol  12.5 mg Oral BID WC  . clopidogrel  75 mg Oral Daily  . sodium chloride  3 mL Intravenous Q12H   Infusions:  . [START ON 08/24/2014] sodium chloride    . heparin 900 Units/hr (08/23/14 1406)  . nitroGLYCERIN 10 mcg/min (08/23/14 0900)    Assessment: 64 yo female with ACS is currently on therapeutic heparin.  Heparin level is 0.56.  Goal of Therapy:  Heparin level 0.3-0.7 units/ml Monitor platelets by anticoagulation protocol: Yes   Plan:  - continue heparin @ 900 units/hr  Wendy Mikles, Tsz-Yin 08/23/2014,5:57 PM

## 2014-08-23 NOTE — Progress Notes (Addendum)
ANTICOAGULATION CONSULT NOTE - follow up  Pharmacy Consult for Heparin Indication: chest pain/ACS  Allergies  Allergen Reactions  . Lisinopril Swelling    Angioedema 06/10/11  . Aspirin Other (See Comments)    Upset stomach  . Chantix [Varenicline]     insomnia  . Penicillins Hives    Patient Measurements: Height: 6' 2.5" (189.2 cm) Weight: 186 lb 6.4 oz (84.55 kg) IBW/kg (Calculated) : 78.85 Heparin Dosing Weight:  83 kg  Vital Signs: Temp: 97.9 F (36.6 C) (05/31 0750) Temp Source: Oral (05/31 0750) BP: 139/62 mmHg (05/31 0750) Pulse Rate: 59 (05/31 0750)  Labs:  Recent Labs  08/22/14 1140 08/22/14 2115 08/22/14 2330 08/23/14 0250 08/23/14 0850  HGB 15.7*  --   --  12.7  --   HCT 47.0*  --   --  39.7  --   PLT 155  --   --  128*  --   HEPARINUNFRC  --  1.84* 1.07*  --  0.70  CREATININE 0.96  --   --   --   --     Estimated Creatinine Clearance: 76.7 mL/min (by C-G formula based on Cr of 0.96).   Medical History: Past Medical History  Diagnosis Date  . Hyperlipidemia   . Hypertension   . GERD (gastroesophageal reflux disease)   . Leg pain   . Headache(784.0)   . Carotid artery occlusion   . COPD (chronic obstructive pulmonary disease)   . CAD (coronary artery disease)   . Peripheral vascular disease   . Tobacco abuse   . Shortness of breath dyspnea     Medications:  See med rec  Assessment: AM heparin level = 0.7 on 900 units/hr for chest pain this  64 y/o female. CBC:  pltc decreased to 128 < 155, Hgb decreased to 12.7 < 15.7.  No bleeding noted. Cardiologist notes this AM patient has accelerated chest pain on exertion now at rest associated with back, jaw pain and left hand numbness. The first troponin is negative, ECG showing frequent PVCs, no ST-T wave changes.  Heparin level is therapeutic.   Goal of Therapy:  Heparin level 0.3-0.7 units/ml Monitor platelets by anticoagulation protocol: Yes   Plan:  Continue heparin at 900  units/hr Heparin level in 6 hrs to confirm. Daily heparin level and CBC Cath today late but most likely tomorrow due to busy cath schedule today.  Nicole Cella, RPh Clinical Pharmacist Pager: [phone] 08/23/2014,9:49 AM

## 2014-08-23 NOTE — Progress Notes (Signed)
Ordered to Hold Heparin gtt for 1 hour and restart at 900 units an hour

## 2014-08-23 NOTE — Progress Notes (Signed)
Restarted heparin gtt at 900 un/hr, per orders. Also, nitroglycerin gtt titrated to 5 mcg/min. Pt reports no chest pain and headache is gone

## 2014-08-23 NOTE — Progress Notes (Signed)
UR Completed Birdena Kingma Graves-Bigelow, RN,BSN [phone]  

## 2014-08-23 NOTE — Care Management Note (Signed)
Case Management Note  Patient Details  Name: Leah [name] MRN: [id number] Date of Birth: June 13, 1950  Subjective/Objective:  Pt admitted for cp. Initiated on Nitro and heparin gtt. Plan for cardiac cath today. Pt is without insurance. Pt goes to Dr. Lorenso Courier [facility]  And she has an orange card for medications to use at the [facility].   Action/Plan: No further needs from CM at this time.    Expected Discharge Date:                  Expected Discharge Plan:  Home/Self Care  In-House Referral:     Discharge planning Services  CM Consult  Post Acute Care Choice:    Choice offered to:     DME Arranged:    DME Agency:     HH Arranged:    [place] Agency:     Status of Service:     Medicare Important Message Given:  No Date Medicare IM Given:    Medicare IM give by:    Date Additional Medicare IM Given:    Additional Medicare Important Message give by:     If discussed at [place] of Stay Meetings, dates discussed:    Additional Comments:  Bethena Roys, RN 08/23/2014, 11:24 AM

## 2014-08-24 ENCOUNTER — Encounter (HOSPITAL_COMMUNITY)
Admission: EM | Disposition: A | Discharge status: Home / Self Care | Payer: Self-pay | Source: Home / Self Care | Attending: Cardiology

## 2014-08-24 ENCOUNTER — Encounter (HOSPITAL_COMMUNITY): Payer: Self-pay | Admitting: Cardiovascular Disease

## 2014-08-24 ENCOUNTER — Ambulatory Visit (HOSPITAL_COMMUNITY): Payer: Medicaid Other

## 2014-08-24 ENCOUNTER — Ambulatory Visit: Payer: Self-pay

## 2014-08-24 DIAGNOSIS — I25110 Atherosclerotic heart disease of native coronary artery with unstable angina pectoris: Secondary | ICD-10-CM

## 2014-08-24 HISTORY — PX: CARDIAC CATHETERIZATION: SHX172

## 2014-08-24 LAB — PROTIME-INR
INR: 1.15 (ref 0.00–1.49)
Prothrombin Time: 14.9 seconds (ref 11.6–15.2)

## 2014-08-24 LAB — CBC
HCT: 38.4 % (ref 36.0–46.0)
HEMOGLOBIN: 12.3 g/dL (ref 12.0–15.0)
MCH: 25.9 pg — ABNORMAL LOW (ref 26.0–34.0)
MCHC: 32 g/dL (ref 30.0–36.0)
MCV: 80.8 fL (ref 78.0–100.0)
PLATELETS: 125 10*3/uL — AB (ref 150–400)
RBC: 4.75 MIL/uL (ref 3.87–5.11)
RDW: 14.8 % (ref 11.5–15.5)
WBC: 9.9 10*3/uL (ref 4.0–10.5)

## 2014-08-24 LAB — LIPID PANEL
Cholesterol: 181 mg/dL (ref 0–200)
HDL: 33 mg/dL — ABNORMAL LOW (ref >40)
LDL Cholesterol: 130 mg/dL — ABNORMAL HIGH (ref 0–99)
Total CHOL/HDL Ratio: 5.5 RATIO
Triglycerides: 88 mg/dL (ref <150)
VLDL: 18 mg/dL (ref 0–40)

## 2014-08-24 LAB — BASIC METABOLIC PANEL
Anion gap: 10 (ref 5–15)
BUN: 8 mg/dL (ref 6–20)
CO2: 23 mmol/L (ref 22–32)
Calcium: 9.1 mg/dL (ref 8.9–10.3)
Chloride: 108 mmol/L (ref 101–111)
Creatinine, Ser: 0.82 mg/dL (ref 0.44–1.00)
GFR calc Af Amer: >60 mL/min (ref >60)
GFR calc non Af Amer: >60 mL/min (ref >60)
Glucose, Bld: 99 mg/dL (ref 65–99)
POTASSIUM: 3.8 mmol/L (ref 3.5–5.1)
Sodium: 141 mmol/L (ref 135–145)

## 2014-08-24 LAB — HEPARIN LEVEL (UNFRACTIONATED): Heparin Unfractionated: 0.42 IU/mL (ref 0.30–0.70)

## 2014-08-24 SURGERY — LEFT HEART CATH AND CORONARY ANGIOGRAPHY
Anesthesia: LOCAL

## 2014-08-24 MED ORDER — MIDAZOLAM HCL 2 MG/2ML IJ SOLN
INTRAMUSCULAR | Status: AC
  Filled 2014-08-24: qty 2

## 2014-08-24 MED ORDER — SODIUM CHLORIDE 0.9 % WEIGHT BASED INFUSION: 1.0000 mL/kg/h | INTRAVENOUS | Status: AC

## 2014-08-24 MED ORDER — HEPARIN SODIUM (PORCINE) 1000 UNIT/ML IJ SOLN
INTRAMUSCULAR | Status: AC
  Filled 2014-08-24: qty 1

## 2014-08-24 MED ORDER — ACETAMINOPHEN 325 MG PO TABS
650.0000 mg | ORAL_TABLET | ORAL | Status: DC | PRN
  Administered 2014-08-24 (×2): 650 mg via ORAL
  Filled 2014-08-24 (×2): qty 2

## 2014-08-24 MED ORDER — MIDAZOLAM HCL 2 MG/2ML IJ SOLN
INTRAMUSCULAR | Status: DC | PRN
  Administered 2014-08-24: 1 mg via INTRAVENOUS

## 2014-08-24 MED ORDER — HEPARIN SODIUM (PORCINE) 1000 UNIT/ML IJ SOLN
INTRAMUSCULAR | Status: DC | PRN
  Administered 2014-08-24: 4000 [IU] via INTRAVENOUS

## 2014-08-24 MED ORDER — HEPARIN (PORCINE) IN NACL 2-0.9 UNIT/ML-% IJ SOLN
INTRAMUSCULAR | Status: AC
  Filled 2014-08-24: qty 1000

## 2014-08-24 MED ORDER — SODIUM CHLORIDE 0.9 % IJ SOLN: 3.0000 mL | INTRAMUSCULAR | Status: DC | PRN

## 2014-08-24 MED ORDER — LIDOCAINE HCL (PF) 1 % IJ SOLN
INTRAMUSCULAR | Status: AC
  Filled 2014-08-24: qty 30

## 2014-08-24 MED ORDER — SODIUM CHLORIDE 0.9 % IJ SOLN
3.0000 mL | Freq: Two times a day (BID) | INTRAMUSCULAR | Status: DC
  Administered 2014-08-25: 3 mL via INTRAVENOUS

## 2014-08-24 MED ORDER — VERAPAMIL HCL 2.5 MG/ML IV SOLN
INTRAVENOUS | Status: AC
  Filled 2014-08-24: qty 2

## 2014-08-24 MED ORDER — SODIUM CHLORIDE 0.9 % IV SOLN: 250.0000 mL | INTRAVENOUS | Status: DC | PRN

## 2014-08-24 MED ORDER — TRAMADOL HCL 50 MG PO TABS: 50.0000 mg | ORAL_TABLET | Freq: Two times a day (BID) | ORAL | Status: DC | PRN

## 2014-08-24 MED ORDER — IOHEXOL 350 MG/ML SOLN
INTRAVENOUS | Status: DC | PRN
  Administered 2014-08-24: 80 mL via INTRAVENOUS

## 2014-08-24 MED ORDER — FENTANYL CITRATE (PF) 100 MCG/2ML IJ SOLN
INTRAMUSCULAR | Status: DC | PRN
  Administered 2014-08-24: 25 ug via INTRAVENOUS

## 2014-08-24 MED ORDER — FENTANYL CITRATE (PF) 100 MCG/2ML IJ SOLN
INTRAMUSCULAR | Status: AC
  Filled 2014-08-24: qty 2

## 2014-08-24 SURGICAL SUPPLY — 10 items
CATH INFINITI 5FR ANG PIGTAIL (CATHETERS) ×2 IMPLANT
CATH OPTITORQUE JACKY 4.0 5F (CATHETERS) ×2 IMPLANT
DEVICE RAD COMP TR BAND LRG (VASCULAR PRODUCTS) ×2 IMPLANT
GLIDESHEATH SLEND SS 6F .021 (SHEATH) ×2 IMPLANT
KIT HEART LEFT (KITS) ×2 IMPLANT
PACK CARDIAC CATHETERIZATION (CUSTOM PROCEDURE TRAY) ×2 IMPLANT
SYR MEDRAD MARK V 150ML (SYRINGE) ×2 IMPLANT
TRANSDUCER W/STOPCOCK (MISCELLANEOUS) ×2 IMPLANT
TUBING CIL FLEX 10 FLL-RA (TUBING) ×2 IMPLANT
WIRE SAFE-T 1.5MM-J .035X260CM (WIRE) ×2 IMPLANT

## 2014-08-24 NOTE — Progress Notes (Signed)
    S:  Called to see pt secondary to right radial hematoma.  Cath lab RN was previously called and has already been applying manual pressure for the past 15 mins.  Hematoma @ radial site now resolved however forearm is firm and tender up to the elbow.  Pt c/o of FA tenderness but otw stable.  O:   Filed Vitals:   08/24/14 1517  BP: 135/69  Pulse:   Temp:   Resp:   Pleasant, NAD. R wrist w/o hematoma, bleeding, or bruit.  TR band in place and inflated w/ 8 cc's presently.  The right forearm is semi-firm to the elbow and tender to touch.  R radial 2+.  A/P:  1.  Right Radial Cath site hematoma:  Resolving with application of manual pressure.  No bruit. Good radial pulse.  Cath lab RN to maintain pressure for an additional 15 mins followed by slow release of TR band.  Treat forearm discomfort with prn tylenol.  Will add tramadol for breakthrough pain.  Murray Hodgkins, NP

## 2014-08-24 NOTE — H&P (View-Only) (Signed)
    Subjective:  No further chest pain. Awaiting cardiac catheterization.  Objective:  Vital Signs in the last 24 hours: Temp:  [98.2 F (36.8 C)-98.8 F (37.1 C)] 98.2 F (36.8 C) (06/01 0717) Pulse Rate:  [52-66] 52 (06/01 0717) Resp:  [18] 18 (06/01 0717) BP: (131-142)/(58-63) 137/61 mmHg (06/01 0717) SpO2:  [95 %-100 %] 100 % (06/01 0717) Weight:  [180 lb 9.6 oz (81.92 kg)] 180 lb 9.6 oz (81.92 kg) (06/01 0400)  Intake/Output from previous day: 05/31 0701 - 06/01 0700 In: 1238.4 [P.O.:1100; I.V.:138.4] Out: 1700 [Urine:1700]  Physical Exam: Pt is alert and oriented, NAD HEENT: normal Neck: JVP - normal Lungs: CTA bilaterally CV: RRR grade 2/6 systolic ejection murmur at the left upper sternal border Abd: soft, NT, Positive BS, no hepatomegaly Ext: no C/C/E, distal pulses intact and equal Skin: warm/dry no rash   Lab Results:  Recent Labs  08/23/14 0250 08/24/14 0538  WBC 12.0* 9.9  HGB 12.7 12.3  PLT 128* 125*    Recent Labs  08/22/14 1140 08/23/14 1630 08/24/14 0538  NA 143  --  141  K 3.1* 3.0* 3.8  CL 105  --  108  CO2 25  --  23  GLUCOSE 134*  --  99  BUN 10  --  8  CREATININE 0.96  --  0.82    Recent Labs  08/23/14 1015  TROPONINI <0.03    Cardiac Studies: Cardiac Cath pending  Assessment/Plan:  1. Unstable angina pectoris: The patient continues on IV heparin. She is on aspirin, clopidogrel, carvedilol, and a high intensity statin drug. Plans for cardiac catheterization today. Risks, indications, and alternatives of cardiac catheterization and possible angioplasty and stenting have been reviewed with the patient.  2. Essential hypertension: Blood pressures reviewed with good control on current regimen.  3. Hyperlipidemia: Lipids above goal. High intensity atorvastatin started. Will need repeat lipids in 2-3 months to assess her response.   Sherren Mocha, M.D. 08/24/2014, 9:40 AM

## 2014-08-24 NOTE — Progress Notes (Signed)
TR band removed.  RT radial level 1.  Forearm continues to be a bit tender but much better.  Soft and palpable pulses.  Vitals stable.  Pressure dsg applied and advised pt to leave in place for 24 hours.  Oncoming night RN at bedside also.

## 2014-08-24 NOTE — Interval H&P Note (Signed)
Cath Lab Visit (complete for each Cath Lab visit)  Clinical Evaluation Leading to the Procedure:   ACS: Yes.    Non-ACS:    Anginal Classification: CCS IV  Anti-ischemic medical therapy: Minimal Therapy (1 class of medications)  Non-Invasive Test Results: No non-invasive testing performed  Prior CABG: No previous CABG      History and Physical Interval Note:  08/24/2014 1:19 PM  Leah [name]  has presented today for surgery, with the diagnosis of unstable angina  The various methods of treatment have been discussed with the patient and family. After consideration of risks, benefits and other options for treatment, the patient has consented to  Procedure(s): Left Heart Cath and Coronary Angiography (N/A) as a surgical intervention .  The patient's history has been reviewed, patient examined, no change in status, stable for surgery.  I have reviewed the patient's chart and labs.  Questions were answered to the patient's satisfaction.     Kathlyn Sacramento

## 2014-08-24 NOTE — Progress Notes (Signed)
    Subjective:  No further chest pain. Awaiting cardiac catheterization.  Objective:  Vital Signs in the last 24 hours: Temp:  [98.2 F (36.8 C)-98.8 F (37.1 C)] 98.2 F (36.8 C) (06/01 0717) Pulse Rate:  [52-66] 52 (06/01 0717) Resp:  [18] 18 (06/01 0717) BP: (131-142)/(58-63) 137/61 mmHg (06/01 0717) SpO2:  [95 %-100 %] 100 % (06/01 0717) Weight:  [180 lb 9.6 oz (81.92 kg)] 180 lb 9.6 oz (81.92 kg) (06/01 0400)  Intake/Output from previous day: 05/31 0701 - 06/01 0700 In: 1238.4 [P.O.:1100; I.V.:138.4] Out: 1700 [Urine:1700]  Physical Exam: Pt is alert and oriented, NAD HEENT: normal Neck: JVP - normal Lungs: CTA bilaterally CV: RRR grade 2/6 systolic ejection murmur at the left upper sternal border Abd: soft, NT, Positive BS, no hepatomegaly Ext: no C/C/E, distal pulses intact and equal Skin: warm/dry no rash   Lab Results:  Recent Labs  08/23/14 0250 08/24/14 0538  WBC 12.0* 9.9  HGB 12.7 12.3  PLT 128* 125*    Recent Labs  08/22/14 1140 08/23/14 1630 08/24/14 0538  NA 143  --  141  K 3.1* 3.0* 3.8  CL 105  --  108  CO2 25  --  23  GLUCOSE 134*  --  99  BUN 10  --  8  CREATININE 0.96  --  0.82    Recent Labs  08/23/14 1015  TROPONINI <0.03    Cardiac Studies: Cardiac Cath pending  Assessment/Plan:  1. Unstable angina pectoris: The patient continues on IV heparin. She is on aspirin, clopidogrel, carvedilol, and a high intensity statin drug. Plans for cardiac catheterization today. Risks, indications, and alternatives of cardiac catheterization and possible angioplasty and stenting have been reviewed with the patient.  2. Essential hypertension: Blood pressures reviewed with good control on current regimen.  3. Hyperlipidemia: Lipids above goal. High intensity atorvastatin started. Will need repeat lipids in 2-3 months to assess her response.   Sherren Mocha, M.D. 08/24/2014, 9:40 AM

## 2014-08-24 NOTE — Progress Notes (Signed)
ANTICOAGULATION CONSULT NOTE - Follow Up Consult  Pharmacy Consult for heparin Indication: chest pain/ACS  Allergies  Allergen Reactions  . Lisinopril Swelling    Angioedema 06/10/11  . Aspirin Other (See Comments)    Upset stomach  . Chantix [Varenicline]     insomnia  . Penicillins Hives    Patient Measurements: Height: 6' 2.5" (189.2 cm) Weight: 180 lb 9.6 oz (81.92 kg) IBW/kg (Calculated) : 78.85 Heparin Dosing Weight: 83 kg  Vital Signs: Temp: 98.6 F (37 C) (06/01 1137) Temp Source: Oral (06/01 1137) BP: 145/65 mmHg (06/01 1137) Pulse Rate: 58 (06/01 1137)  Labs:  Recent Labs  08/22/14 1140  08/23/14 0250 08/23/14 0850 08/23/14 1015 08/23/14 1630 08/24/14 0538  HGB 15.7*  --  12.7  --   --   --  12.3  HCT 47.0*  --  39.7  --   --   --  38.4  PLT 155  --  128*  --   --   --  125*  LABPROT  --   --   --   --   --   --  14.9  INR  --   --   --   --   --   --  1.15  HEPARINUNFRC  --   < >  --  0.70  --  0.56 0.42  CREATININE 0.96  --   --   --   --   --  0.82  TROPONINI  --   --   --   --  <0.03  --   --   < > = values in this interval not displayed.  Estimated Creatinine Clearance: 89.7 mL/min (by C-G formula based on Cr of 0.82).   Medications:  Scheduled:  . amLODipine  10 mg Oral Daily  . aspirin  325 mg Oral Daily  . atorvastatin  80 mg Oral q1800  . carvedilol  12.5 mg Oral BID WC  . clopidogrel  75 mg Oral Daily  . sodium chloride  3 mL Intravenous Q12H   Infusions:  . sodium chloride 75 mL/hr at 08/24/14 0513  . heparin 900 Units/hr (08/23/14 1800)  . nitroGLYCERIN 10 mcg/min (08/23/14 1800)    Assessment: 64 yo female with ACS is currently on therapeutic heparin.  Heparin level is 0.42 on IV heparin 900 units/hr. H/H low/stable and PLTC decreased to 125K from 155K.  No bleeding noted. Cardiology notes no further chest pain. Awaiting cardiac catheterization.   Goal of Therapy:  Heparin level 0.3-0.7 units/ml Monitor platelets by  anticoagulation protocol: Yes   Plan:  Continue heparin @ 900 units/hr Daily heparin level and CBC while on IV heparin. F/u after cardiac cath today.  Nicole Cella, RPh Clinical Pharmacist Pager: [phone] 08/24/2014,12:07 PM

## 2014-08-24 NOTE — Progress Notes (Signed)
Small amount bleeding noted after third time of withdrawing air from TR band.  3cc air pushed back in and bleeding stopped.  Pt c/o pain in RT forearm and swelling getting much worse at TR band site.  Cath lab notified.  Eddie Dibbles, RN at bedside to assess pt.  Ignacia Bayley, NP also up to bedside.  Will continue to monitor closely.

## 2014-08-25 ENCOUNTER — Ambulatory Visit (HOSPITAL_COMMUNITY): Payer: Medicaid Other

## 2014-08-25 ENCOUNTER — Encounter (HOSPITAL_COMMUNITY): Payer: Self-pay | Admitting: *Deleted

## 2014-08-25 ENCOUNTER — Inpatient Hospital Stay (HOSPITAL_COMMUNITY): Payer: Medicaid Other

## 2014-08-25 DIAGNOSIS — R9389 Abnormal findings on diagnostic imaging of other specified body structures: Secondary | ICD-10-CM | POA: Diagnosis present

## 2014-08-25 DIAGNOSIS — R079 Chest pain, unspecified: Secondary | ICD-10-CM

## 2014-08-25 DIAGNOSIS — I340 Nonrheumatic mitral (valve) insufficiency: Secondary | ICD-10-CM | POA: Diagnosis present

## 2014-08-25 LAB — CBC
HEMATOCRIT: 37.2 % (ref 36.0–46.0)
Hemoglobin: 12.2 g/dL (ref 12.0–15.0)
MCH: 26.3 pg (ref 26.0–34.0)
MCHC: 32.8 g/dL (ref 30.0–36.0)
MCV: 80.3 fL (ref 78.0–100.0)
PLATELETS: 133 10*3/uL — AB (ref 150–400)
RBC: 4.63 MIL/uL (ref 3.87–5.11)
RDW: 14.9 % (ref 11.5–15.5)
WBC: 10.5 10*3/uL (ref 4.0–10.5)

## 2014-08-25 MED ORDER — ASPIRIN EC 81 MG PO TBEC: 81.0000 mg | DELAYED_RELEASE_TABLET | Freq: Every day | ORAL | Status: DC

## 2014-08-25 MED ORDER — ACETAMINOPHEN 325 MG PO TABS: 650.0000 mg | ORAL_TABLET | ORAL | Status: DC | PRN

## 2014-08-25 MED ORDER — NITROGLYCERIN 0.4 MG SL SUBL: 0.4000 mg | SUBLINGUAL_TABLET | SUBLINGUAL | Status: DC | PRN

## 2014-08-25 MED ORDER — IOHEXOL 300 MG/ML  SOLN
80.0000 mL | Freq: Once | INTRAMUSCULAR | Status: AC | PRN
  Administered 2014-08-25: 80 mL via INTRAVENOUS

## 2014-08-25 MED FILL — Lidocaine HCl Local Preservative Free (PF) Inj 1%: INTRAMUSCULAR | Qty: 30 | Status: AC

## 2014-08-25 MED FILL — Heparin Sodium (Porcine) 2 Unit/ML in Sodium Chloride 0.9%: INTRAMUSCULAR | Qty: 1000 | Status: AC

## 2014-08-25 NOTE — Progress Notes (Signed)
Echocardiogram 2D Echocardiogram has been performed.  Leah [name] 08/25/2014, 3:32 PM

## 2014-08-25 NOTE — Progress Notes (Signed)
Pt discharged via W/C, condition stable, radial site 0, accompanied by family.

## 2014-08-25 NOTE — Discharge Instructions (Signed)
Chest Pain Observation It is often hard to give a specific diagnosis for the cause of chest pain. Among other possibilities your symptoms might be caused by inadequate oxygen delivery to your heart (angina). Angina that is not treated or evaluated can lead to a heart attack (myocardial infarction) or death. Blood tests, electrocardiograms, and X-rays may have been done to help determine a possible cause of your chest pain. After evaluation and observation, your health care provider has determined that it is unlikely your pain was caused by an unstable condition that requires hospitalization. However, a full evaluation of your pain may need to be completed, with additional diagnostic testing as directed. It is very important to keep your follow-up appointments. Not keeping your follow-up appointments could result in permanent heart damage, disability, or death. If there is any problem keeping your follow-up appointments, you must call your health care provider. HOME CARE INSTRUCTIONS  Due to the slight chance that your pain could be angina, it is important to follow your health care provider's treatment plan and also maintain a healthy lifestyle:  Maintain or work toward achieving a healthy weight.  Stay physically active and exercise regularly.  Decrease your salt intake.  Eat a balanced, healthy diet. Talk to a dietitian to learn about heart-healthy foods.  Increase your fiber intake by including whole grains, vegetables, fruits, and nuts in your diet.  Avoid situations that cause stress, anger, or depression.  Take medicines as advised by your health care provider. Report any side effects to your health care provider. Do not stop medicines or adjust the dosages on your own.  Quit smoking. Do not use nicotine patches or gum until you check with your health care provider.  Keep your blood pressure, blood sugar, and cholesterol levels within normal limits.  Limit alcohol intake to no more  than 1 drink per day for women who are not pregnant and 2 drinks per day for men.  Do not abuse drugs. SEEK IMMEDIATE MEDICAL CARE IF: You have severe chest pain or pressure which may include symptoms such as:  You feel pain or pressure in your arms, neck, jaw, or back.  You have severe back or abdominal pain, feel sick to your stomach (nauseous), or throw up (vomit).  You are sweating profusely.  You are having a fast or irregular heartbeat.  You feel short of breath while at rest.  You notice increasing shortness of breath during rest, sleep, or with activity.  You have chest pain that does not get better after rest or after taking your usual medicine.  You wake from sleep with chest pain.  You are unable to sleep because you cannot breathe.  You develop a frequent cough or you are coughing up blood.  You feel dizzy, faint, or experience extreme fatigue.  You develop severe weakness, dizziness, fainting, or chills. Any of these symptoms may represent a serious problem that is an emergency. Do not wait to see if the symptoms will go away. Call your local emergency services (911 in the [place] Do not drive yourself to the hospital. MAKE SURE YOU:  Understand these instructions.  Will watch your condition.  Will get help right away if you are not doing well or get worse. Document Released: 04/13/2010 Document Revised: 03/16/2013 Document Reviewed: 09/10/2012 [facility] Patient Information 2015 [place], [place]. This information is not intended to replace advice given to you by your health care provider. Make sure you discuss any questions you have with your health care provider. Radial  Site Care Refer to this sheet in the next few weeks. These instructions provide you with information on caring for yourself after your procedure. Your caregiver may also give you more specific instructions. Your treatment has been planned according to current medical practices, but problems sometimes  occur. Call your caregiver if you have any problems or questions after your procedure. HOME CARE INSTRUCTIONS  You may shower the day after the procedure.Remove the bandage (dressing) and gently wash the site with plain soap and water.Gently pat the site dry.  Do not apply powder or lotion to the site.  Do not submerge the affected site in water for 3 to 5 days.  Inspect the site at least twice daily.  Do not flex or bend the affected arm for 24 hours.  No lifting over 5 pounds (2.3 kg) for 5 days after your procedure.  Do not drive home if you are discharged the same day of the procedure. Have someone else drive you.  You may drive 24 hours after the procedure unless otherwise instructed by your caregiver.  Do not operate machinery or power tools for 24 hours.  A responsible adult should be with you for the first 24 hours after you arrive home. What to expect:  Any bruising will usually fade within 1 to 2 weeks.  Blood that collects in the tissue (hematoma) may be painful to the touch. It should usually decrease in size and tenderness within 1 to 2 weeks. SEEK IMMEDIATE MEDICAL CARE IF:  You have unusual pain at the radial site.  You have redness, warmth, swelling, or pain at the radial site.  You have drainage (other than a small amount of blood on the dressing).  You have chills.  You have a fever or persistent symptoms for more than 72 hours.  You have a fever and your symptoms suddenly get worse.  Your arm becomes pale, cool, tingly, or numb.  You have heavy bleeding from the site. Hold pressure on the site. Document Released: 04/13/2010 Document Revised: 06/03/2011 Document Reviewed: 04/13/2010 [facility] Patient Information 2015 [place], [place]. This information is not intended to replace advice given to you by your health care provider. Make sure you discuss any questions you have with your health care provider.

## 2014-08-25 NOTE — Progress Notes (Signed)
Subjective:  No chest pain. Rt arm sore, no hematoma  Objective:  Vital Signs in the last 24 hours: Temp:  [97.8 F (36.6 C)-98.8 F (37.1 C)] 97.8 F (36.6 C) (06/02 0541) Pulse Rate:  [0-154] 68 (06/02 0849) Resp:  [0-19] 16 (06/02 0849) BP: (109-166)/(55-89) 138/55 mmHg (06/02 0849) SpO2:  [0 %-100 %] 98 % (06/02 0849) Weight:  [180 lb 1.9 oz (81.7 kg)] 180 lb 1.9 oz (81.7 kg) (06/02 0541)  Intake/Output from previous day:  Intake/Output Summary (Last 24 hours) at 08/25/14 0948 Last data filed at 08/25/14 1856  Gross per 24 hour  Intake 486.95 ml  Output   1850 ml  Net -1363.05 ml    Physical Exam: General appearance: alert, cooperative and no distress Neck: no JVD and LCA bruit Lungs: clear to auscultation bilaterally Heart: regular rate and rhythm and 2/6 systolic murmur AOV and MV area Extremities: Rt radial site without hematoma, good radial pulse   Rate: 68  Rhythm: normal sinus rhythm and premature ventricular contractions (PVC)  Lab Results:  Recent Labs  08/24/14 0538 08/25/14 0342  WBC 9.9 10.5  HGB 12.3 12.2  PLT 125* 133*    Recent Labs  08/22/14 1140 08/23/14 1630 08/24/14 0538  NA 143  --  141  K 3.1* 3.0* 3.8  CL 105  --  108  CO2 25  --  23  GLUCOSE 134*  --  99  BUN 10  --  8  CREATININE 0.96  --  0.82    Recent Labs  08/23/14 1015  TROPONINI <0.03    Recent Labs  08/24/14 0538  INR 1.15    Scheduled Meds: . amLODipine  10 mg Oral Daily  . aspirin  325 mg Oral Daily  . atorvastatin  80 mg Oral q1800  . carvedilol  12.5 mg Oral BID WC  . clopidogrel  75 mg Oral Daily  . sodium chloride  3 mL Intravenous Q12H   Continuous Infusions:  PRN Meds:.sodium chloride, acetaminophen, sodium chloride, traMADol, traZODone   Imaging: Imaging results have been reviewed   Assessment/Plan:  64 y/o AA female with a history of CAD, S/P PCI/ stenting of the mid LAD in June of 2009 with a 2.5 x 12 mm Promus stent. She has  PVD as well and is status post left carotid endarterectomy in August of 2009, right external iliac stenting and bilateral renal artery stenting in 2009, and left femoropopliteal bypass grafting by Dr. Kellie Simmering in 2012. In August 2013 she had some chest pain. A Myoview study showed mild apical ischemia. She had repeat cardiac catheterization which demonstrated a 50% stenosis in the LAD at the distal stent margin. Otherwise nonobstructive disease. She has been managed medically but has not had insurance and she has not had  f/u with Dr [place] or Dr Kellie Simmering but she has been going to [facility].   Pt presented to 08/22/14 to the ER with a typical exertional chest pain. She had started to notice left arm numbness about a week ago, followed by back pain on 5/29, then jaw pain and chest pain that got worse on exertion on 5/30. This was similar to her presentation prior to the stent placement in 2009. She was admitted and ruled out for an MI. Cath done 08/24/14 revealed distal stent restenosis of 70%. It was not felt to be hemodynamically significant and the plan is for medical Rx. She has a systolic murmur and an abnormal CXR.  Principal  Problem:   Unstable angina Active Problems:   CAD S/P LAD DES 2009   Essential hypertension, benign   PVD- s/p multiple proceedures   Abnormal CXR   Murmur, cardiac   Hyperlipidemia   TOBACCO ABUSE   PVC (premature ventricular contraction)   PLAN: Check CT of chest per radiology recommendation and echo per Dr Tyrell Antonio recommendation. Possible discharge later today. She is on Plavix, no ASA secondary to GI intolerance.   Kerin Ransom PA-C 08/25/2014, 9:48 AM [phone]  Patient seen, examined. Available data reviewed. Agree with findings, assessment, and plan as outlined by Kerin Ransom, PA-C. The patient is independently interviewed and examined. She has a grade 2/6 ejection murmur along the left sternal border. Lungs are clear. Right arm is tender but there  is no significant hematoma. Agree with plans as outlined above. She is going to have a CT scan of the chest to evaluate a nodule seen on chest x-ray. An echocardiogram is going to be done to evaluate her heart murmur. I don't think this has to be completed as an inpatient if it is going to hold up her discharge. She should be ready for discharge home today. If the echo can be done soon, it could be arranged as an outpatient. Agree with plans for medical therapy for her CAD.  Sherren Mocha, M.D. 08/25/2014 11:41 AM

## 2014-08-25 NOTE — Discharge Summary (Signed)
Patient ID: Leah [name],  MRN: [id number], DOB/AGE: 1951/02/21 64 y.o.  Admit date: 08/22/2014 Discharge date: 08/25/2014  Primary Care Provider: Archie Patten, MD Primary Cardiologist: Dr Martinique  Discharge Diagnoses Principal Problem:   Unstable angina Active Problems:   CAD S/P LAD DES 2009   Essential hypertension, benign   PVD- s/p multiple proceedures   Abnormal CXR   Murmur, cardiac   Hyperlipidemia   TOBACCO ABUSE   PVC (premature ventricular contraction)    Procedures:  Coronary angiogram 08/24/14                         Echocardiogram 08/25/14                         CT chest 08/25/14   Hospital Course:  64 y/o AA female with a history of CAD, S/P PCI/ stenting of the mid LAD in June of 2009 with a 2.5 x 12 mm Promus stent. She has PVD as well and is status post left carotid endarterectomy in August of 2009, right external iliac stenting and bilateral renal artery stenting in 2009, and left femoropopliteal bypass grafting by Dr. Kellie Simmering in 2012. In August 2013 she had some chest pain. A Myoview study showed mild apical ischemia. She had repeat cardiac catheterization which demonstrated a 50% stenosis in the LAD at the distal stent margin. Otherwise nonobstructive disease. She has been managed medically but has not had insurance and she has not had f/u with Dr Martinique or Dr Kellie Simmering but she has been going to [facility].   Pt presented to 08/22/14 to the ER with a typical exertional chest pain. She had started to notice left arm numbness about a week ago, followed by back pain on 5/29, then jaw pain and chest pain that got worse on exertion on 5/30. This was similar to her presentation prior to the stent placement in 2009. She was admitted and ruled out for an MI. Cath done 08/24/14 revealed distal stent restenosis of 70%. It was not felt to be hemodynamically significant and the plan is for medical Rx. She has a systolic murmur and an abnormal CXR.  A CT of  her chest was done and showed a 6 mm RUL nodule which is new. A f/u CT needs to be done in 6 months and she has been instructed to follow up with her PCP about this.. An Echo was done prior to discharge and will need to be reviewed on follow up.    Discharge Vitals:  Blood pressure 134/58, pulse 53, temperature 98 F (36.7 C), temperature source Oral, resp. rate 19, height 6' 2.5" (1.892 m), weight 180 lb 1.9 oz (81.7 kg), SpO2 100 %.    Labs: Results for orders placed or performed during the hospital encounter of 08/22/14 (from the past 24 hour(s))  CBC     Status: Abnormal   Collection Time: 08/25/14  3:42 AM  Result Value Ref Range   WBC 10.5 4.0 - 10.5 K/uL   RBC 4.63 3.87 - 5.11 MIL/uL   Hemoglobin 12.2 12.0 - 15.0 g/dL   HCT 37.2 36.0 - 46.0 %   MCV 80.3 78.0 - 100.0 fL   MCH 26.3 26.0 - 34.0 pg   MCHC 32.8 30.0 - 36.0 g/dL   RDW 14.9 11.5 - 15.5 %   Platelets 133 (L) 150 - 400 K/uL    Disposition:  Follow-up Information    Follow up with Peter Martinique, MD.   Specialty:  Cardiology   Why:  office will contact you for follow up   Contact information:   [address] [place] [address] [phone]       Follow up with Archie Patten, MD.   Specialty:  Family Medicine   Why:  Call for follow up appointmentin a few weeks. You will need another chest CT in 6 months.   Contact information:   [address] [place] [address] [phone]       Discharge Medications:    Medication List    STOP taking these medications        aspirin 325 MG tablet  Replaced by:  aspirin EC 81 MG tablet     sulfamethoxazole-trimethoprim 800-160 MG per tablet  Commonly known as:  BACTRIM DS,SEPTRA DS     traMADol 50 MG tablet  Commonly known as:  ULTRAM      TAKE these medications        acetaminophen 325 MG tablet  Commonly known as:  TYLENOL  Take 2 tablets (650 mg total) by mouth every 4 (four) hours as needed for fever, headache or mild  pain.     amLODipine 10 MG tablet  Commonly known as:  NORVASC  Take 1 tablet (10 mg total) by mouth daily.     aspirin EC 81 MG tablet  Take 1 tablet (81 mg total) by mouth daily.     atorvastatin 80 MG tablet  Commonly known as:  LIPITOR  Take 1 tablet (80 mg total) by mouth daily.     carvedilol 12.5 MG tablet  Commonly known as:  COREG  Take 1 tablet (12.'5mg'$ ) in the morning and 2 tables ('25mg'$ ) in the evening.     clopidogrel 75 MG tablet  Commonly known as:  PLAVIX  Take 1 tablet (75 mg total) by mouth daily.     cyclobenzaprine 10 MG tablet  Commonly known as:  FLEXERIL  TAKE 1 TABLET BY MOUTH 3 TIMES DAILY AS NEEDED FOR MUSCLE SPASMS     HYDROcodone-acetaminophen 5-325 MG per tablet  Commonly known as:  NORCO/VICODIN  Take 1 tablet by mouth every 6 (six) hours as needed for moderate pain.     nitroGLYCERIN 0.4 MG SL tablet  Commonly known as:  NITROSTAT  Place 1 tablet (0.4 mg total) under the tongue every 5 (five) minutes as needed for chest pain (for severe chest pressure or tightness).     traZODone 100 MG tablet  Commonly known as:  DESYREL  Take 1 tablet (100 mg total) by mouth at bedtime as needed for sleep.         Duration of Discharge Encounter: Greater than 30 minutes including physician time.  Angelena Form PA-C 08/25/2014 4:38 PM

## 2014-08-26 ENCOUNTER — Telehealth: Payer: Self-pay | Admitting: Cardiology

## 2014-08-26 MED ORDER — NITROGLYCERIN 0.4 MG SL SUBL: 0.4000 mg | SUBLINGUAL_TABLET | SUBLINGUAL | Status: DC | PRN

## 2014-08-26 NOTE — Telephone Encounter (Signed)
Checking status, could not come yesterday because she was in the hospital.

## 2014-08-26 NOTE — Telephone Encounter (Signed)
Patient needs hospital discharge follow up phone call.

## 2014-08-26 NOTE — Telephone Encounter (Signed)
Left message instructing patient to call back.

## 2014-08-26 NOTE — Telephone Encounter (Signed)
From looking through the chart nitro should be at the health dept, however I re-ordered it.  Additionally, please ask her to come in for an appt for pain medication.   Thanks, Archie Patten, MD [facility] Family Medicine Resident  08/26/2014, 4:02 PM

## 2014-08-26 NOTE — Telephone Encounter (Signed)
Patient calling about her nitroglycerin that was suppose to have been given to her upon discharge from hospital.  Still haven't received to pharmacy.  Please send this to [facility] department pharmacy.

## 2014-08-29 ENCOUNTER — Telehealth: Payer: Self-pay | Admitting: Family Medicine

## 2014-08-29 NOTE — Telephone Encounter (Signed)
Pt is requesting a refill on her Flexeril and Hydrocodone.She has an appointment on 6/24 but that is the soonest that the doctor can see her. Can she have enough medication to get her to her appointment? Please let patient know. jw

## 2014-08-30 NOTE — Telephone Encounter (Signed)
Please let the patient know that we unfortunately cannot refill her Rx prior to evaluating her.  Thanks, Archie Patten, MD [facility] Family Medicine Resident  08/30/2014, 5:22 PM

## 2014-08-31 ENCOUNTER — Ambulatory Visit: Payer: Self-pay

## 2014-08-31 ENCOUNTER — Telehealth: Payer: Self-pay | Admitting: Family Medicine

## 2014-08-31 NOTE — Telephone Encounter (Signed)
Needs refill on all her medications To be called in to health dept Please let pt know when it is ready

## 2014-08-31 NOTE — Telephone Encounter (Signed)
Needs refills on norvasc, lipitor, coreg, and plavix. Has appointment scheduled for 6/24 with PCP.

## 2014-09-01 ENCOUNTER — Other Ambulatory Visit: Payer: Self-pay | Admitting: *Deleted

## 2014-09-01 MED ORDER — ATORVASTATIN CALCIUM 80 MG PO TABS
80.0000 mg | ORAL_TABLET | Freq: Every day | ORAL | Status: DC
Start: 2014-09-01 — End: 2015-01-23

## 2014-09-01 MED ORDER — CARVEDILOL 12.5 MG PO TABS: ORAL_TABLET | ORAL | Status: DC

## 2014-09-01 MED ORDER — AMLODIPINE BESYLATE 10 MG PO TABS
10.0000 mg | ORAL_TABLET | Freq: Every day | ORAL | Status: DC
Start: 2014-09-01 — End: 2014-12-26

## 2014-09-01 MED ORDER — CLOPIDOGREL BISULFATE 75 MG PO TABS: 75.0000 mg | ORAL_TABLET | Freq: Every day | ORAL | Status: DC

## 2014-09-01 NOTE — Telephone Encounter (Signed)
Rx refilled and placed as "fax"  Archie Patten, MD [facility] Family Medicine Resident  09/01/2014, 9:43 AM

## 2014-09-01 NOTE — Telephone Encounter (Signed)
Rx refilled and sent as "fax'  Archie Patten, MD [facility] Family Medicine Resident  09/01/2014, 9:44 AM

## 2014-09-07 ENCOUNTER — Ambulatory Visit (INDEPENDENT_AMBULATORY_CARE_PROVIDER_SITE_OTHER): Payer: Self-pay | Admitting: Cardiology

## 2014-09-07 ENCOUNTER — Encounter: Payer: Self-pay | Admitting: Cardiology

## 2014-09-07 VITALS — BP 160/78 | HR 80 | Ht 74.0 in | Wt 187.0 lb

## 2014-09-07 DIAGNOSIS — R079 Chest pain, unspecified: Secondary | ICD-10-CM

## 2014-09-07 DIAGNOSIS — R0602 Shortness of breath: Secondary | ICD-10-CM

## 2014-09-07 MED ORDER — NITROGLYCERIN 0.4 MG SL SUBL: 0.4000 mg | SUBLINGUAL_TABLET | SUBLINGUAL | Status: DC | PRN

## 2014-09-07 MED ORDER — CARVEDILOL 25 MG PO TABS: 25.0000 mg | ORAL_TABLET | Freq: Two times a day (BID) | ORAL | Status: DC

## 2014-09-07 NOTE — Patient Instructions (Signed)
Medication Instructions:   START TAKING COREG 25 MG TWICE  A DAY   ONE TABLET IN THE AM AND ONE TABLET IN THE PM  Labwork:    Testing/Procedures:   Follow-Up:  WITH DR Martinique IN 6 TO 8 WEEKS FOR MR  Any Other Special Instructions Will Be Listed Below (If Applicable).  YOU CAN FOLLOW WITH PCP IN 6 MONTHS ABOUT CT SCAN

## 2014-09-07 NOTE — Progress Notes (Signed)
09/07/2014 Leah [name]   [birth year]  [id number]  Primary Physician Kathrine Cords, MD Primary Cardiologist: Dr. Martinique  Reason for Visit/CC: [facility] F/u for CAD  HPI: 64 y/o AA female with a history of CAD, S/P PCI/ stenting of the mid LAD in June of 2009 with a 2.5 x 12 mm Promus stent. She has PVD as well and is status post left carotid endarterectomy in August of 2009, right external iliac stenting and bilateral renal artery stenting in 2009, and left femoropopliteal bypass grafting by Dr. Kellie Simmering in 2012. In August 2013 she had some chest pain. A Myoview study showed mild apical ischemia. She had repeat cardiac catheterization which demonstrated a 50% stenosis in the LAD at the distal stent margin. Otherwise nonobstructive disease. She has been managed medically but has not had insurance and she has not had f/u with Dr Martinique or Dr Kellie Simmering but she has been going to [facility].   Pt presented on 08/22/14 to the ER with a typical exertional chest pain. She had started to notice left arm numbness followed by back pain, then jaw pain and chest pain that got worse on exertion on 5/30. This was similar to her presentation prior to the stent placement in 2009. She was admitted and ruled out for an MI. Cath done 08/24/14 revealed distal stent restenosis of 70%. It was not felt to be hemodynamically significant and the plan is for medical Rx.  2D echo showed normal LVF with EF of 60-65% with grade 2 DD.  She was also noted to have mild to moderate AI and moderate to severe MR (thickened mitral valve c/w rheumatic disease). Also notable during admission was an abnormal CXR leading to a CT of her chest which showed a 6 mm RUL nodule which is new. A f/u CT needs to be done in 6 months and she has been instructed to follow up with her PCP about this.  Today, she presents for post hospital f/u. She reports that she has done well since discharge. She denies any recurrent CP. She  notes occasional left arm/ shoulder pain. Worse in the am after waking. Improves throughout the day but worsens with movement/ household chores. Not exertional. She denies resting dyspnea but notes dyspnea with moderate activity. She does admit that she woke suddenly this morning with sudden dyspnea that resolved quickly after sitting up in bed. No associated chest pain, weight gain or LEE. She has had no further recurrence of this. Currently asymptomatic.     Current Outpatient Prescriptions  Medication Sig Dispense Refill  . acetaminophen (TYLENOL) 325 MG tablet Take 2 tablets (650 mg total) by mouth every 4 (four) hours as needed for fever, headache or mild pain.    Marland Kitchen amLODipine (NORVASC) 10 MG tablet Take 1 tablet (10 mg total) by mouth daily. 30 tablet 2  . aspirin EC 81 MG tablet Take 1 tablet (81 mg total) by mouth daily.    Marland Kitchen atorvastatin (LIPITOR) 80 MG tablet Take 1 tablet (80 mg total) by mouth daily. 30 tablet 2  . carvedilol (COREG) 25 MG tablet Take 1 tablet (25 mg total) by mouth 2 (two) times daily with a meal. ONE TABLET IN AM ONE TABLET IN PM 60 tablet 6  . clopidogrel (PLAVIX) 75 MG tablet Take 1 tablet (75 mg total) by mouth daily. 30 tablet 2  . cyclobenzaprine (FLEXERIL) 10 MG tablet TAKE 1 TABLET BY MOUTH 3 TIMES DAILY AS NEEDED FOR MUSCLE SPASMS  60 tablet 0  . HYDROcodone-acetaminophen (NORCO/VICODIN) 5-325 MG per tablet Take 1 tablet by mouth every 6 (six) hours as needed for moderate pain. 45 tablet 0  . nitroGLYCERIN (NITROSTAT) 0.4 MG SL tablet Place 1 tablet (0.4 mg total) under the tongue every 5 (five) minutes as needed for chest pain (for severe chest pressure or tightness). 15 tablet 3  . traZODone (DESYREL) 100 MG tablet Take 1 tablet (100 mg total) by mouth at bedtime as needed for sleep. 30 tablet 2  . [DISCONTINUED] buPROPion (WELLBUTRIN SR) 150 MG 12 hr tablet Take 1 tablet (150 mg total) by mouth 2 (two) times daily. 60 tablet 3  . [DISCONTINUED] simvastatin  (ZOCOR) 40 MG tablet Take 40 mg by mouth at bedtime.      No current facility-administered medications for this visit.    Allergies  Allergen Reactions  . Lisinopril Swelling    Angioedema 06/10/11  . Aspirin Other (See Comments)    Upset stomach  . Chantix [Varenicline]     insomnia  . Penicillins Hives    History   Social History  . Marital Status: Divorced    Spouse Name: N/A  . Number of Children: 4  . Years of Education: N/A   Occupational History  .  Lab [employer]   Social History Main Topics  . Smoking status: Current Every Day Smoker -- 0.50 packs/day for 35 years    Types: Cigarettes  . Smokeless tobacco: Never Used     Comment: has not started wellbutrin yet  . Alcohol Use: No  . Drug Use: No  . Sexual Activity: Not on file   Other Topics Concern  . Not on file   Social History Narrative     Review of Systems: General: negative for chills, fever, night sweats or weight changes.  Cardiovascular: negative for chest pain, dyspnea on exertion, edema, orthopnea, palpitations, paroxysmal nocturnal dyspnea or shortness of breath Dermatological: negative for rash Respiratory: negative for cough or wheezing Urologic: negative for hematuria Abdominal: negative for nausea, vomiting, diarrhea, bright red blood per rectum, melena, or hematemesis Neurologic: negative for visual changes, syncope, or dizziness All other systems reviewed and are otherwise negative except as noted above.    Blood pressure 160/78, pulse 80, height '6\' 2"'$  (1.88 m), weight 187 lb (84.823 kg).  General appearance: alert, cooperative and no distress Neck: no carotid bruit and no JVD Lungs: clear to auscultation bilaterally Heart: regular rate and rhythm and 2/6 murmur at RUSB and 1/6 murmur along LSB Extremities: no LEE Pulses: 2+ and symmetric Skin: warm and dry Neurologic: Grossly normal  EKG Sinus Rhythm with frequent PVCs 80 bpm  ASSESSMENT AND PLAN:   1. CAD: recent LHC showed  distal LAD stent restenosis of 70% (minimally changed from prior cath). It was not felt to be hemodynamically significant. Medical therapy recommended. She denies any recurrent CP. Left shoulder pain more c/w musculoskeletal/ arthritic pain. Continue ASA, Plavix, Coreg, amlodipine and lisinopril.   2. Aortic Insufficiency: Mild to moderate by echo. Occasional dyspnea with moderate activity. Asymptomatic with light physical activity and rest.   3. Mitral Regurgitation: Moderate to severe by echo. Thickened valve c/w rheumatic disease.  Occasional dyspnea with moderate activity. Asymptomatic with light physical activity and rest. Recommend watchful-waiting. Given severity, may eventually need surgery. F/u with Dr. Martinique.   4. Pulmonary Nodule: 6 mm RUL nodule which is new. A f/u CT needs to be done in 6 months and she has been instructed to follow up  with her PCP about this.  5. HTN: moderately elevated at 160/78. HR 80 bpm. EKG with frequent PVCs. Will increase Coreg to 25 mg BID for HTN and suppression of PVCs.   6. Tobacco Abuse: smoking cessation strongly advised.   PLAN  Continue medical therapy. Increase Coreg as outlined above. F/u with Dr. Martinique in 4-6 weeks regarding her mitral valve disease.   Lyda Jester PA-C 09/07/2014 8:37 AM

## 2014-09-16 ENCOUNTER — Encounter: Payer: Self-pay | Admitting: Family Medicine

## 2014-09-16 ENCOUNTER — Ambulatory Visit (INDEPENDENT_AMBULATORY_CARE_PROVIDER_SITE_OTHER): Payer: Self-pay | Admitting: Family Medicine

## 2014-09-16 VITALS — BP 142/59 | HR 66 | Temp 97.6°F | Ht 74.5 in | Wt 183.0 lb

## 2014-09-16 DIAGNOSIS — R079 Chest pain, unspecified: Secondary | ICD-10-CM

## 2014-09-16 DIAGNOSIS — I10 Essential (primary) hypertension: Secondary | ICD-10-CM

## 2014-09-16 DIAGNOSIS — M25512 Pain in left shoulder: Secondary | ICD-10-CM

## 2014-09-16 DIAGNOSIS — R011 Cardiac murmur, unspecified: Secondary | ICD-10-CM

## 2014-09-16 DIAGNOSIS — G4700 Insomnia, unspecified: Secondary | ICD-10-CM

## 2014-09-16 DIAGNOSIS — R911 Solitary pulmonary nodule: Secondary | ICD-10-CM

## 2014-09-16 MED ORDER — TRAZODONE HCL 100 MG PO TABS: 100.0000 mg | ORAL_TABLET | Freq: Every evening | ORAL | Status: DC | PRN

## 2014-09-16 MED ORDER — HYDROCODONE-ACETAMINOPHEN 5-325 MG PO TABS: 1.0000 | ORAL_TABLET | Freq: Four times a day (QID) | ORAL | Status: DC | PRN

## 2014-09-16 MED ORDER — CYCLOBENZAPRINE HCL 10 MG PO TABS: ORAL_TABLET | ORAL | Status: DC

## 2014-09-16 NOTE — Progress Notes (Signed)
HFU- Chest pain  Stated still with shoulder and arm pain  No Hx injury  Hx tobacco-3 cigarette per day

## 2014-09-16 NOTE — Patient Instructions (Addendum)
Please make an appointment with Dr. Valentina Lucks to have your lung function tested  Please start using flexeril to see if it helps with the pain in your left arm. Massage and heat can be helpful. Please come back in 2 weeks if it is not improving. Please come back sooner if it is worsening or changing.  If you notice chest pain or shortness of breath, come in immediately. Good job on decreasing the number of cigarettes. You can call 1-800-QUIT-NOW for someone to talk to 24hrs per day.

## 2014-09-16 NOTE — Progress Notes (Signed)
Patient ID: Leah [name], female   DOB: 06/24/1950, 64 y.o.   MRN: [id number]     Subjective: CC:Hospital follow up.  HPI: Patient is a 64 y.o. female with a past medical history of CAD, HLD, and HTN,  presenting to clinic today for a hospital follow up. At that time, she presented with exertional chest pain followed by numbness followed by left arm numbness, back pain, jaw pain, then worsening chest pain. Cath done 08/24/14 revealed distal stent restenosis of 70% not felt to be hemodynamically significant: medical management. A CT of her chest was done and showed a 6 mm RUL nodule which is new. We discussed this and her echo results (EF 60-65% with G2DD with PA peak pressure of 64mHg).  Her coreg was also increased.  The patient states she's been doing well since her hospitalization. No chest pain or SOB. Does note continued left scapula pain with numbness of the L arm. States it feels very tight. She notices the numbness the most in the AM after waking up after lying on it. She has not tried anything for this pain. Her R arm has significantly improved- she states there was swelling/hematoma after the cath but that the pain has resolved and the bruising has improved. No LE edema noted. No PND recently. No orthopnea. She is concerned as someone kept telling her she had COPD.  She does note occasional DOE.  In the past we'd discussed scarring in the left lower lobe that was noted incidentally on abdominal imaging and had discussed screening with CT but patient had preferred to put it off due to financial constraints. She's never had PFTs from her report.    Social History: Continues to smoke, 3 cigs/day. Contemplating cessation.  ROS: All other systems reviewed and are negative.  Past Medical History Patient Active Problem List   Diagnosis Date Noted  . Left shoulder pain 09/17/2014  . Pulmonary nodule, right 09/17/2014  . Abnormal CXR 08/25/2014  . Murmur, cardiac 08/25/2014  . Unstable  angina 08/22/2014  . Hematuria 07/01/2014  . Burning sensation of feet 02/01/2014  . Jaw pain 10/18/2012  . Low back pain 07/05/2012  . Insomnia 07/05/2012  . PVC (premature ventricular contraction) 10/02/2011  . Fatigue 09/04/2011  . Chest pain 09/04/2011  . History of angioedema 06/14/2011  . PVD- s/p multiple proceedures 06/04/2011  . TOBACCO ABUSE 06/12/2009  . Hyperlipidemia 09/28/2008  . Essential hypertension, benign 09/28/2008  . CAD S/P LAD DES 2009 09/28/2008    Medications- reviewed and updated Current Outpatient Prescriptions  Medication Sig Dispense Refill  . acetaminophen (TYLENOL) 325 MG tablet Take 2 tablets (650 mg total) by mouth every 4 (four) hours as needed for fever, headache or mild pain.    .Marland KitchenamLODipine (NORVASC) 10 MG tablet Take 1 tablet (10 mg total) by mouth daily. 30 tablet 2  . atorvastatin (LIPITOR) 80 MG tablet Take 1 tablet (80 mg total) by mouth daily. 30 tablet 2  . carvedilol (COREG) 25 MG tablet Take 1 tablet (25 mg total) by mouth 2 (two) times daily with a meal. ONE TABLET IN AM ONE TABLET IN PM 60 tablet 6  . clopidogrel (PLAVIX) 75 MG tablet Take 1 tablet (75 mg total) by mouth daily. 30 tablet 2  . cyclobenzaprine (FLEXERIL) 10 MG tablet TAKE 1 TABLET BY MOUTH 3 TIMES DAILY AS NEEDED FOR MUSCLE SPASMS 70 tablet 0  . HYDROcodone-acetaminophen (NORCO/VICODIN) 5-325 MG per tablet Take 1 tablet by mouth every 6 (six) hours  as needed for moderate pain. 45 tablet 0  . nitroGLYCERIN (NITROSTAT) 0.4 MG SL tablet Place 1 tablet (0.4 mg total) under the tongue every 5 (five) minutes as needed for chest pain (for severe chest pressure or tightness). 25 tablet 3  . traZODone (DESYREL) 100 MG tablet Take 1 tablet (100 mg total) by mouth at bedtime as needed for sleep. 30 tablet 2  . aspirin EC 81 MG tablet Take 1 tablet (81 mg total) by mouth daily. (Patient not taking: Reported on 09/16/2014)    . [DISCONTINUED] buPROPion (WELLBUTRIN SR) 150 MG 12 hr tablet  Take 1 tablet (150 mg total) by mouth 2 (two) times daily. 60 tablet 3  . [DISCONTINUED] simvastatin (ZOCOR) 40 MG tablet Take 40 mg by mouth at bedtime.      No current facility-administered medications for this visit.    Objective: Office vital signs reviewed. BP 142/59 mmHg  Pulse 66  Temp(Src) 97.6 F (36.4 C) (Oral)  Ht 6' 2.5" (1.892 m)  Wt 183 lb (83.008 kg)  BMI 23.19 kg/m2   Physical Examination:  General: Awake, alert, well- nourished, NAD. Pleasant. Eyes: Conjunctiva non-injected. Cardio: RRR, I/VI systolic murmur. No rubs or gallops. No carotid bruits noted. No pitting edema Pulm: No increased WOB.  CTAB, without wheezes, rhonchi or crackles noted.  MSK: Normal gait and station. Muscular tension noted over the left scapula, reportedly improved with massage. No tenderness in the shoulder joints.  Full AROM with some pain noted around scapula. No crepitus noted with movement. Negative empty can test. 5/5 strength in the UE b/l.  Skin: dry, intact. Some ecchymoses noted on R forearm without TTP or hematoma noted.  Neuro: Sensation grossly intact, DTRs 2/4  Assessment/Plan: Chest pain Chest pain has resolved since her hospitalization. Given her h/o CAD s/p stent placement, I am appreciative of the work up that was performed.   - Continue plavix and ASA  - continue increased dose of coreg, amlodipine - could consider imdur in the future if needed.  Murmur, cardiac Patient noted to have aortic insufficiency on echo as well as mitral regurgitation consistent with rheumatic fever. - watchful waiting per cardiology notes  Essential hypertension, benign BP improved, however still not at goal. Continue coreg, amlodipine.  Consider the addition of HCTZ if needed No ACEi or ARB given h/o angioedema.   Left shoulder pain Pain does not appear to be originating from joint space, more located over the scapula. Knot with tensioned noted on exam with improvement with massaging.  Numbness in L arm is concerning for cardiac etiology, however this has been ruled and pt without chest pain. Pain also worsened with sleeping on the arm and movement. Does not appear to be originating from cervical spine. - flexeril PRN pain - heating packs - massage  - discussed importance of returning if pain worsens or changes in nature or she has new associated symptoms such as CP, SOB, diaphoresis, or other neurologic symptoms like facial droop. Pt voiced understanding.  - Pt to f/u with me in 1 month.   Pulmonary nodule, right Patient is at an increased risk of cancer given her h/o smoking and continued smoking.  -A f/u CT needs to be done in 6 months (02/2015).  -Referral to Dr. Valentina Lucks for PFTs.  Smoking cessation instruction/counseling given:  counseled patient on the dangers of tobacco use, advised patient to stop smoking, and reviewed strategies to maximize success     No orders of the defined types were placed in  this encounter.    Meds ordered this encounter  Medications  . HYDROcodone-acetaminophen (NORCO/VICODIN) 5-325 MG per tablet    Sig: Take 1 tablet by mouth every 6 (six) hours as needed for moderate pain.    Dispense:  45 tablet    Refill:  0  . cyclobenzaprine (FLEXERIL) 10 MG tablet    Sig: TAKE 1 TABLET BY MOUTH 3 TIMES DAILY AS NEEDED FOR MUSCLE SPASMS    Dispense:  70 tablet    Refill:  0  . traZODone (DESYREL) 100 MG tablet    Sig: Take 1 tablet (100 mg total) by mouth at bedtime as needed for sleep.    Dispense:  30 tablet    Refill:  [place] PGY-1, [place]

## 2014-09-17 ENCOUNTER — Encounter: Payer: Self-pay | Admitting: Family Medicine

## 2014-09-17 DIAGNOSIS — M25512 Pain in left shoulder: Secondary | ICD-10-CM | POA: Insufficient documentation

## 2014-09-17 NOTE — Assessment & Plan Note (Signed)
Patient noted to have aortic insufficiency on echo as well as mitral regurgitation consistent with rheumatic fever. - watchful waiting per cardiology notes

## 2014-09-17 NOTE — Assessment & Plan Note (Addendum)
Patient is at an increased risk of cancer given her h/o smoking and continued smoking.  -A f/u CT needs to be done in 6 months (02/2015).  -Referral to Dr. Valentina Lucks for PFTs.  Smoking cessation instruction/counseling given:  counseled patient on the dangers of tobacco use, advised patient to stop smoking, and reviewed strategies to maximize success

## 2014-09-17 NOTE — Assessment & Plan Note (Addendum)
BP improved, however still not at goal. Continue coreg, amlodipine.  Consider the addition of HCTZ if needed No ACEi or ARB given h/o angioedema.

## 2014-09-17 NOTE — Assessment & Plan Note (Signed)
Pain does not appear to be originating from joint space, more located over the scapula. Knot with tensioned noted on exam with improvement with massaging. Numbness in L arm is concerning for cardiac etiology, however this has been ruled and pt without chest pain. Pain also worsened with sleeping on the arm and movement. Does not appear to be originating from cervical spine. - flexeril PRN pain - heating packs - massage  - discussed importance of returning if pain worsens or changes in nature or she has new associated symptoms such as CP, SOB, diaphoresis, or other neurologic symptoms like facial droop. Pt voiced understanding.  - Pt to f/u with me in 1 month.

## 2014-09-17 NOTE — Assessment & Plan Note (Addendum)
Chest pain has resolved since her hospitalization. Given her h/o CAD s/p stent placement, I am appreciative of the work up that was performed.   - Continue plavix and ASA  - continue increased dose of coreg, amlodipine - could consider imdur in the future if needed.

## 2014-09-29 ENCOUNTER — Ambulatory Visit: Payer: Self-pay | Admitting: Pharmacist

## 2014-10-17 ENCOUNTER — Encounter: Payer: Self-pay | Admitting: Family Medicine

## 2014-10-17 ENCOUNTER — Ambulatory Visit (INDEPENDENT_AMBULATORY_CARE_PROVIDER_SITE_OTHER): Payer: Self-pay | Admitting: Family Medicine

## 2014-10-17 VITALS — BP 142/76 | HR 71 | Temp 98.2°F | Ht 74.5 in | Wt 183.0 lb

## 2014-10-17 DIAGNOSIS — Z720 Tobacco use: Secondary | ICD-10-CM

## 2014-10-17 DIAGNOSIS — G5602 Carpal tunnel syndrome, left upper limb: Secondary | ICD-10-CM

## 2014-10-17 DIAGNOSIS — G5600 Carpal tunnel syndrome, unspecified upper limb: Secondary | ICD-10-CM | POA: Insufficient documentation

## 2014-10-17 DIAGNOSIS — M545 Low back pain, unspecified: Secondary | ICD-10-CM

## 2014-10-17 DIAGNOSIS — F17200 Nicotine dependence, unspecified, uncomplicated: Secondary | ICD-10-CM

## 2014-10-17 MED ORDER — HYDROCODONE-ACETAMINOPHEN 5-325 MG PO TABS: 1.0000 | ORAL_TABLET | Freq: Four times a day (QID) | ORAL | Status: DC | PRN

## 2014-10-17 MED ORDER — BUPROPION HCL ER (XL) 150 MG PO TB24: 150.0000 mg | ORAL_TABLET | Freq: Every day | ORAL | Status: DC

## 2014-10-17 MED ORDER — CYCLOBENZAPRINE HCL 10 MG PO TABS: ORAL_TABLET | ORAL | Status: DC

## 2014-10-17 MED ORDER — TRAMADOL HCL 50 MG PO TABS: 50.0000 mg | ORAL_TABLET | Freq: Three times a day (TID) | ORAL | Status: DC | PRN

## 2014-10-17 NOTE — Progress Notes (Signed)
Patient ID: Leah [name], female   DOB: [birth year], 64 y.o.   MRN: [id number]    Subjective: CC:f/u back pain HPI: Patient is a 63 y.o. female with a past medical history of lower back pain presenting to clinic today for f/u on pain.  Lumbar back pain:  Pain pretty well controlled currently. Patient prescribed hydrocodone-APAP, however feels like it'd be ok to transition back to tramadol. Patient not currently doing any exercises. Pain is worse with movement/housework or prolonged sitting. No radicular pain. No urinary or bowel incontinence. No saddle paresthesias.  Tingling on L arm: Present since hospitalization however seems to have changed. Present on waking ju  for approximately 1 hour, then resolves. Tingling is from thumb/wrist and seems to radiate to elbow. She sleeps on the L side most of the night. 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 Notes using her phone sometimes bothers it. No weakness noted.   Social History: smokes 10 cigs/day. Interested in quitting. Heard good things about Wellbutrin.   Health Maintenance: Patient is at an increased risk of cancer given her h/o smoking and continued smoking.  A f/u CT needs to be done in 6 months (02/2015) given pulmonary nodules noted previously.   ROS: All other systems reviewed and are negative.  Past Medical History Patient Active Problem List   Diagnosis Date Noted  . Carpal tunnel syndrome 10/17/2014  . Left shoulder pain 09/17/2014  . Pulmonary nodule, right 09/17/2014  . Abnormal CXR 08/25/2014  . Murmur, cardiac 08/25/2014  . Unstable angina 08/22/2014  . Hematuria 07/01/2014  . Burning sensation of feet 02/01/2014  . Jaw pain 10/18/2012  . Low back pain 07/05/2012  . Insomnia 07/05/2012  . PVC (premature ventricular contraction) 10/02/2011  . Fatigue 09/04/2011  . Chest pain 09/04/2011  . History of angioedema 06/14/2011  . PVD- s/p multiple proceedures 06/04/2011  . TOBACCO ABUSE 06/12/2009  .  Hyperlipidemia 09/28/2008  . Essential hypertension, benign 09/28/2008  . CAD S/P LAD DES 2009 09/28/2008    Medications- reviewed and updated Current Outpatient Prescriptions  Medication Sig Dispense Refill  . acetaminophen (TYLENOL) 325 MG tablet Take 2 tablets (650 mg total) by mouth every 4 (four) hours as needed for fever, headache or mild pain.    Marland Kitchen amLODipine (NORVASC) 10 MG tablet Take 1 tablet (10 mg total) by mouth daily. 30 tablet 2  . aspirin EC 81 MG tablet Take 1 tablet (81 mg total) by mouth daily. (Patient not taking: Reported on 09/16/2014)    . atorvastatin (LIPITOR) 80 MG tablet Take 1 tablet (80 mg total) by mouth daily. 30 tablet 2  . buPROPion (WELLBUTRIN XL) 150 MG 24 hr tablet Take 1 tablet (150 mg total) by mouth daily. For 3 days, then increase '300mg'$  (2 tablets) daily 60 tablet 0  . carvedilol (COREG) 25 MG tablet Take 1 tablet (25 mg total) by mouth 2 (two) times daily with a meal. ONE TABLET IN AM ONE TABLET IN PM 60 tablet 6  . clopidogrel (PLAVIX) 75 MG tablet Take 1 tablet (75 mg total) by mouth daily. 30 tablet 2  . cyclobenzaprine (FLEXERIL) 10 MG tablet TAKE 1 TABLET BY MOUTH 3 TIMES DAILY AS NEEDED FOR MUSCLE SPASMS 70 tablet 0  . HYDROcodone-acetaminophen (NORCO/VICODIN) 5-325 MG per tablet Take 1 tablet by mouth every 6 (six) hours as needed (breakthrough pain). 15 tablet 0  . nitroGLYCERIN (NITROSTAT) 0.4 MG SL tablet Place 1 tablet (0.4 mg total) under the tongue  every 5 (five) minutes as needed for chest pain (for severe chest pressure or tightness). 25 tablet 3  . traMADol (ULTRAM) 50 MG tablet Take 1 tablet (50 mg total) by mouth every 8 (eight) hours as needed. 60 tablet 0  . traZODone (DESYREL) 100 MG tablet Take 1 tablet (100 mg total) by mouth at bedtime as needed for sleep. 30 tablet 2  . [DISCONTINUED] simvastatin (ZOCOR) 40 MG tablet Take 40 mg by mouth at bedtime.      No current facility-administered medications for this visit.     Objective: Office vital signs reviewed. BP 142/76 mmHg  Pulse 71  Temp(Src) 98.2 F (36.8 C) (Oral)  Ht 6' 2.5" (1.892 m)  Wt 183 lb (83.008 kg)  BMI 23.19 kg/m2   Physical Examination:  General: Awake, alert, well- nourished, NAD.  Cardio: RRR, no m/r/g noted. No pitting edema.  Pulm: No increased WOB. CTAB, without wheezes, rhonchi or crackles noted.  MSK: TTP over lumbar region in the midline. No drop-offs noted. Negative SLR. No abnormal curvature of the spine with bending over. Able to illicit numbness/tingling with palpation over the median nerve.  5/5 strength in the UE and LE bilaterally. Normal gait and station. Neuro: DTRs 2/4  Assessment/Plan: TOBACCO ABUSE Patient has started to increase her smoking again. Discussed that the best results come with combo of nicotine replacement and wellbutrin. Pt provided 1-800 number, as they sometimes have free patches available (pt is self pay).  - Wellbutrin XL '150mg'$  qAM x 3d, then increase to '300mg'$  qAM  - Discussed precautions/warnings with Wellbutrin, pt voiced understanding. - F/u in 2 weeks - Pt may benefit from tobacco cessation coubseling here in clinic as well.   Low back pain No red flags on exam or history (radicular pain, paresthesias, bowel/bladder incontinence).  - Discussed back exercises, heat, and ice - Tramadol '50mg'$  q8hr Prn, #60 - Hydrocodone-APAP #15 for break through as we transition back to tramadol  - continue Flexeril - Stress back exercises, if no improvement refer to PT.   Carpal tunnel syndrome History and exam of L arm c/w carpal tunnel. No pain noted. No h/o injury. Good pulses noted. - Discussed obtaining a wrist splint and wearing it nightly -continue to monitor.     No orders of the defined types were placed in this encounter.    Meds ordered this encounter  Medications  . HYDROcodone-acetaminophen (NORCO/VICODIN) 5-325 MG per tablet    Sig: Take 1 tablet by mouth every 6 (six) hours  as needed (breakthrough pain).    Dispense:  15 tablet    Refill:  0  . cyclobenzaprine (FLEXERIL) 10 MG tablet    Sig: TAKE 1 TABLET BY MOUTH 3 TIMES DAILY AS NEEDED FOR MUSCLE SPASMS    Dispense:  70 tablet    Refill:  0  . traMADol (ULTRAM) 50 MG tablet    Sig: Take 1 tablet (50 mg total) by mouth every 8 (eight) hours as needed.    Dispense:  60 tablet    Refill:  0  . buPROPion (WELLBUTRIN XL) 150 MG 24 hr tablet    Sig: Take 1 tablet (150 mg total) by mouth daily. For 3 days, then increase '300mg'$  (2 tablets) daily    Dispense:  60 tablet    Refill:  [place] PGY-2, [place]

## 2014-10-17 NOTE — Assessment & Plan Note (Signed)
History and exam of L arm c/w carpal tunnel. No pain noted. No h/o injury. Good pulses noted. - Discussed obtaining a wrist splint and wearing it nightly -continue to monitor.

## 2014-10-17 NOTE — Assessment & Plan Note (Signed)
No red flags on exam or history (radicular pain, paresthesias, bowel/bladder incontinence).  - Discussed back exercises, heat, and ice - Tramadol '50mg'$  q8hr Prn, #60 - Hydrocodone-APAP #15 for break through as we transition back to tramadol  - continue Flexeril - Stress back exercises, if no improvement refer to PT.

## 2014-10-17 NOTE — Assessment & Plan Note (Signed)
Patient has started to increase her smoking again. Discussed that the best results come with combo of nicotine replacement and wellbutrin. Pt provided 1-800 number, as they sometimes have free patches available (pt is self pay).  - Wellbutrin XL '150mg'$  qAM x 3d, then increase to '300mg'$  qAM  - Discussed precautions/warnings with Wellbutrin, pt voiced understanding. - F/u in 2 weeks - Pt may benefit from tobacco cessation coubseling here in clinic as well.

## 2014-10-17 NOTE — Patient Instructions (Addendum)
You can call 1-800-QUIT-NOW for someone to talk to 24hrs per day, you can also ask them for nicotine patches. I have prescribed 150 mg once daily for 3 days; increase to 150 mg twice daily. If you note a worsened mood or abnormal thoughts, please come in immediately. Carpal Tunnel Release Carpal tunnel release is done to relieve the pressure on the nerves and tendons on the bottom side of your wrist.  LET YOUR CAREGIVER KNOW ABOUT:   Allergies to food or medicine.  Medicines taken, including vitamins, herbs, eyedrops, over-the-counter medicines, and creams.  Use of steroids (by mouth or creams).  Previous problems with anesthetics or numbing medicines.  History of bleeding problems or blood clots.  Previous surgery.  Other health problems, including diabetes and kidney problems.  Possibility of pregnancy, if this applies. RISKS AND COMPLICATIONS  Some problems that may happen after this procedure include:  Infection.  Damage to the nerves, arteries or tendons could occur. This would be very uncommon.  Bleeding. BEFORE THE PROCEDURE   This surgery may be done while you are asleep (general anesthetic) or may be done under a block where only your forearm and the surgical area is numb.  If the surgery is done under a block, the numbness will gradually wear off within several hours after surgery. HOME CARE INSTRUCTIONS   Have a responsible person with you for 24 hours.  Do not drive a car or use public transportation for 24 hours.  Only take over-the-counter or prescription medicines for pain, discomfort, or fever as directed by your caregiver. Take them as directed.  You may put ice on the palm side of the affected wrist.  Put ice in a plastic bag.  Place a towel between your skin and the bag.  Leave the ice on for 20 to 30 minutes, 4 times per day.  If you were given a splint to keep your wrist from bending, use it as directed. It is important to wear the splint at  night or as directed. Use the splint for as long as you have pain or numbness in your hand, arm, or wrist. This may take 1 to 2 months.  Keep your hand raised (elevated) above the level of your heart as much as possible. This keeps swelling down and helps with discomfort.  Change bandages (dressings) as directed.  Keep the wound clean and dry. SEEK MEDICAL CARE IF:   You develop pain not relieved with medications.  You develop numbness of your hand.  You develop bleeding from your surgical site.  You have an oral temperature above 102 F (38.9 C).  You develop redness or swelling of the surgical site.  You develop new, unexplained problems. SEEK IMMEDIATE MEDICAL CARE IF:   You develop a rash.  You have difficulty breathing.  You develop any reaction or side effects to medications given. Document Released: 06/01/2003 Document Revised: 06/03/2011 Document Reviewed: 01/15/2007 [facility] Patient Information 2015 [place], [place]. This information is not intended to replace advice given to you by your health care provider. Make sure you discuss any questions you have with your health care provider.   Bupropion extended-release tablets (Depression/Mood Disorders) What is this medicine? BUPROPION (byoo PROE pee on) is used to treat depression. This medicine may be used for other purposes; ask your health care provider or pharmacist if you have questions. COMMON BRAND NAME(S): Aplenzin, Budeprion XL, Forfivo XL, Wellbutrin XL What should I tell my health care provider before I take this medicine? They need  to know if you have any of these conditions: -an eating disorder, such as anorexia or bulimia -bipolar disorder or psychosis -diabetes or high blood sugar, treated with medication -glaucoma -head injury or brain tumor -heart disease, previous heart attack, or irregular heart beat -high blood pressure -kidney or liver disease -seizures (convulsions) -suicidal thoughts or a  previous suicide attempt -Tourette's syndrome -weight loss -an unusual or allergic reaction to bupropion, other medicines, foods, dyes, or preservatives -breast-feeding -pregnant or trying to become pregnant How should I use this medicine? Take this medicine by mouth with a glass of water. Follow the directions on the prescription label. You can take it with or without food. If it upsets your stomach, take it with food. Do not crush, chew, or cut these tablets. This medicine is taken once daily at the same time each day. Do not take your medicine more often than directed. Do not stop taking this medicine suddenly except upon the advice of your doctor. Stopping this medicine too quickly may cause serious side effects or your condition may worsen. A special MedGuide will be given to you by the pharmacist with each prescription and refill. Be sure to read this information carefully each time. Talk to your pediatrician regarding the use of this medicine in children. Special care may be needed. Overdosage: If you think you have taken too much of this medicine contact a poison control center or emergency room at once. NOTE: This medicine is only for you. Do not share this medicine with others. What if I miss a dose? If you miss a dose, skip the missed dose and take your next tablet at the regular time. Do not take double or extra doses. What may interact with this medicine? Do not take this medicine with any of the following medications: -linezolid -MAOIs like Azilect, Carbex, Eldepryl, Marplan, Nardil, and Parnate -methylene blue (injected into a vein) -other medicines that contain bupropion like Zyban This medicine may also interact with the following medications: -alcohol -certain medicines for anxiety or sleep -certain medicines for blood pressure like metoprolol, propranolol -certain medicines for depression or psychotic disturbances -certain medicines for HIV or AIDS like efavirenz,  lopinavir, nelfinavir, ritonavir -certain medicines for irregular heart beat like propafenone, flecainide -certain medicines for Parkinson's disease like amantadine, levodopa -certain medicines for seizures like carbamazepine, phenytoin, phenobarbital -cimetidine -clopidogrel -cyclophosphamide -furazolidone -isoniazid -nicotine -orphenadrine -procarbazine -steroid medicines like prednisone or cortisone -stimulant medicines for attention disorders, weight loss, or to stay awake -tamoxifen -theophylline -thiotepa -ticlopidine -tramadol -warfarin This list may not describe all possible interactions. Give your health care provider a list of all the medicines, herbs, non-prescription drugs, or dietary supplements you use. Also tell them if you smoke, drink alcohol, or use illegal drugs. Some items may interact with your medicine. What should I watch for while using this medicine? Tell your doctor if your symptoms do not get better or if they get worse. Visit your doctor or health care professional for regular checks on your progress. Because it may take several weeks to see the full effects of this medicine, it is important to continue your treatment as prescribed by your doctor. Patients and their families should watch out for new or worsening thoughts of suicide or depression. Also watch out for sudden changes in feelings such as feeling anxious, agitated, panicky, irritable, hostile, aggressive, impulsive, severely restless, overly excited and hyperactive, or not being able to sleep. If this happens, especially at the beginning of treatment or after a change in  dose, call your health care professional. Avoid alcoholic drinks while taking this medicine. Drinking large amounts of alcoholic beverages, using sleeping or anxiety medicines, or quickly stopping the use of these agents while taking this medicine may increase your risk for a seizure. Do not drive or use heavy machinery until you know  how this medicine affects you. This medicine can impair your ability to perform these tasks. Do not take this medicine close to bedtime. It may prevent you from sleeping. Your mouth may get dry. Chewing sugarless gum or sucking hard candy, and drinking plenty of water may help. Contact your doctor if the problem does not go away or is severe. The tablet shell for some brands of this medicine does not dissolve. This is normal. The tablet shell may appear whole in the stool. This is not a cause for concern. What side effects may I notice from receiving this medicine? Side effects that you should report to your doctor or health care professional as soon as possible: -allergic reactions like skin rash, itching or hives, swelling of the face, lips, or tongue -breathing problems -changes in vision -confusion -fast or irregular heartbeat -hallucinations -increased blood pressure -redness, blistering, peeling or loosening of the skin, including inside the mouth -seizures -suicidal thoughts or other mood changes -unusually weak or tired -vomiting Side effects that usually do not require medical attention (report to your doctor or health care professional if they continue or are bothersome): -change in sex drive or performance -constipation -headache -loss of appetite -nausea -tremors -weight loss This list may not describe all possible side effects. Call your doctor for medical advice about side effects. You may report side effects to FDA at 1-800-FDA-1088. Where should I keep my medicine? Keep out of the reach of children. Store at room temperature between 15 and 30 degrees C (59 and 86 degrees F). Throw away any unused medicine after the expiration date. NOTE: This sheet is a summary. It may not cover all possible information. If you have questions about this medicine, talk to your doctor, pharmacist, or health care provider.  2015, [employer]. (2012-10-02 12:39:42)

## 2014-10-21 ENCOUNTER — Telehealth: Payer: Self-pay | Admitting: Family Medicine

## 2014-10-21 ENCOUNTER — Inpatient Hospital Stay (HOSPITAL_COMMUNITY)
Admission: EM | Admit: 2014-10-21 | Discharge: 2014-10-28 | DRG: 287 | Disposition: A | Discharge status: Home / Self Care | Payer: Medicaid Other | Attending: Cardiology | Admitting: Cardiology

## 2014-10-21 ENCOUNTER — Emergency Department (HOSPITAL_COMMUNITY): Payer: Medicaid Other

## 2014-10-21 ENCOUNTER — Encounter (HOSPITAL_COMMUNITY): Payer: Self-pay | Admitting: Emergency Medicine

## 2014-10-21 DIAGNOSIS — R918 Other nonspecific abnormal finding of lung field: Secondary | ICD-10-CM

## 2014-10-21 DIAGNOSIS — Z7902 Long term (current) use of antithrombotics/antiplatelets: Secondary | ICD-10-CM | POA: Diagnosis not present

## 2014-10-21 DIAGNOSIS — I2510 Atherosclerotic heart disease of native coronary artery without angina pectoris: Secondary | ICD-10-CM | POA: Diagnosis present

## 2014-10-21 DIAGNOSIS — R911 Solitary pulmonary nodule: Secondary | ICD-10-CM | POA: Diagnosis present

## 2014-10-21 DIAGNOSIS — K219 Gastro-esophageal reflux disease without esophagitis: Secondary | ICD-10-CM | POA: Diagnosis present

## 2014-10-21 DIAGNOSIS — I739 Peripheral vascular disease, unspecified: Secondary | ICD-10-CM

## 2014-10-21 DIAGNOSIS — I10 Essential (primary) hypertension: Secondary | ICD-10-CM | POA: Diagnosis present

## 2014-10-21 DIAGNOSIS — R7989 Other specified abnormal findings of blood chemistry: Secondary | ICD-10-CM

## 2014-10-21 DIAGNOSIS — Z7982 Long term (current) use of aspirin: Secondary | ICD-10-CM | POA: Diagnosis not present

## 2014-10-21 DIAGNOSIS — Z87898 Personal history of other specified conditions: Secondary | ICD-10-CM

## 2014-10-21 DIAGNOSIS — I248 Other forms of acute ischemic heart disease: Secondary | ICD-10-CM | POA: Diagnosis present

## 2014-10-21 DIAGNOSIS — I340 Nonrheumatic mitral (valve) insufficiency: Secondary | ICD-10-CM

## 2014-10-21 DIAGNOSIS — N2889 Other specified disorders of kidney and ureter: Secondary | ICD-10-CM | POA: Diagnosis present

## 2014-10-21 DIAGNOSIS — I080 Rheumatic disorders of both mitral and aortic valves: Secondary | ICD-10-CM | POA: Diagnosis present

## 2014-10-21 DIAGNOSIS — I5033 Acute on chronic diastolic (congestive) heart failure: Secondary | ICD-10-CM | POA: Diagnosis present

## 2014-10-21 DIAGNOSIS — I509 Heart failure, unspecified: Secondary | ICD-10-CM

## 2014-10-21 DIAGNOSIS — J449 Chronic obstructive pulmonary disease, unspecified: Secondary | ICD-10-CM | POA: Diagnosis present

## 2014-10-21 DIAGNOSIS — R0602 Shortness of breath: Secondary | ICD-10-CM | POA: Diagnosis present

## 2014-10-21 DIAGNOSIS — T82857A Stenosis of cardiac prosthetic devices, implants and grafts, initial encounter: Secondary | ICD-10-CM | POA: Diagnosis present

## 2014-10-21 DIAGNOSIS — R778 Other specified abnormalities of plasma proteins: Secondary | ICD-10-CM | POA: Diagnosis present

## 2014-10-21 DIAGNOSIS — F17210 Nicotine dependence, cigarettes, uncomplicated: Secondary | ICD-10-CM | POA: Diagnosis present

## 2014-10-21 DIAGNOSIS — R011 Cardiac murmur, unspecified: Secondary | ICD-10-CM | POA: Diagnosis present

## 2014-10-21 DIAGNOSIS — I351 Nonrheumatic aortic (valve) insufficiency: Secondary | ICD-10-CM | POA: Diagnosis present

## 2014-10-21 DIAGNOSIS — I5189 Other ill-defined heart diseases: Secondary | ICD-10-CM | POA: Diagnosis present

## 2014-10-21 DIAGNOSIS — I2489 Other forms of acute ischemic heart disease: Secondary | ICD-10-CM

## 2014-10-21 DIAGNOSIS — E876 Hypokalemia: Secondary | ICD-10-CM | POA: Diagnosis present

## 2014-10-21 DIAGNOSIS — E785 Hyperlipidemia, unspecified: Secondary | ICD-10-CM | POA: Diagnosis present

## 2014-10-21 DIAGNOSIS — Z9861 Coronary angioplasty status: Secondary | ICD-10-CM

## 2014-10-21 DIAGNOSIS — Z720 Tobacco use: Secondary | ICD-10-CM | POA: Diagnosis present

## 2014-10-21 DIAGNOSIS — Y831 Surgical operation with implant of artificial internal device as the cause of abnormal reaction of the patient, or of later complication, without mention of misadventure at the time of the procedure: Secondary | ICD-10-CM | POA: Diagnosis present

## 2014-10-21 HISTORY — DX: Other specified disorders of kidney and ureter: N28.89

## 2014-10-21 LAB — BASIC METABOLIC PANEL
Anion gap: 9 (ref 5–15)
BUN: 11 mg/dL (ref 6–20)
CHLORIDE: 108 mmol/L (ref 101–111)
CO2: 24 mmol/L (ref 22–32)
Calcium: 9.2 mg/dL (ref 8.9–10.3)
Creatinine, Ser: 0.98 mg/dL (ref 0.44–1.00)
GFR calc Af Amer: >60 mL/min (ref >60)
GLUCOSE: 113 mg/dL — AB (ref 65–99)
Potassium: 3.1 mmol/L — ABNORMAL LOW (ref 3.5–5.1)
SODIUM: 141 mmol/L (ref 135–145)

## 2014-10-21 LAB — BRAIN NATRIURETIC PEPTIDE: B NATRIURETIC PEPTIDE 5: 1223.3 pg/mL — AB (ref 0.0–100.0)

## 2014-10-21 LAB — CBC
HEMATOCRIT: 39 % (ref 36.0–46.0)
HEMOGLOBIN: 12.8 g/dL (ref 12.0–15.0)
MCH: 26.1 pg (ref 26.0–34.0)
MCHC: 32.8 g/dL (ref 30.0–36.0)
MCV: 79.6 fL (ref 78.0–100.0)
PLATELETS: 144 10*3/uL — AB (ref 150–400)
RBC: 4.9 MIL/uL (ref 3.87–5.11)
RDW: 15.7 % — ABNORMAL HIGH (ref 11.5–15.5)
WBC: 12.4 10*3/uL — ABNORMAL HIGH (ref 4.0–10.5)

## 2014-10-21 LAB — HEPARIN LEVEL (UNFRACTIONATED): Heparin Unfractionated: 0.67 IU/mL (ref 0.30–0.70)

## 2014-10-21 LAB — MAGNESIUM: Magnesium: 1.7 mg/dL (ref 1.7–2.4)

## 2014-10-21 LAB — MRSA PCR SCREENING: MRSA by PCR: NEGATIVE

## 2014-10-21 LAB — TROPONIN I
Troponin I: 0.08 ng/mL — ABNORMAL HIGH (ref <0.031)
Troponin I: 0.1 ng/mL — ABNORMAL HIGH (ref <0.031)

## 2014-10-21 LAB — I-STAT TROPONIN, ED: TROPONIN I, POC: 0.11 ng/mL — AB (ref 0.00–0.08)

## 2014-10-21 LAB — PROTIME-INR
INR: 1.19 (ref 0.00–1.49)
Prothrombin Time: 15.3 seconds — ABNORMAL HIGH (ref 11.6–15.2)

## 2014-10-21 MED ORDER — FUROSEMIDE 10 MG/ML IJ SOLN
40.0000 mg | INTRAMUSCULAR | Status: AC
  Administered 2014-10-21: 40 mg via INTRAVENOUS
  Filled 2014-10-21: qty 4

## 2014-10-21 MED ORDER — ASPIRIN EC 81 MG PO TBEC
81.0000 mg | DELAYED_RELEASE_TABLET | Freq: Every day | ORAL | Status: DC
  Administered 2014-10-22 – 2014-10-28 (×7): 81 mg via ORAL
  Filled 2014-10-21 (×7): qty 1

## 2014-10-21 MED ORDER — CYCLOBENZAPRINE HCL 5 MG PO TABS
7.5000 mg | ORAL_TABLET | Freq: Three times a day (TID) | ORAL | Status: DC | PRN
  Filled 2014-10-21: qty 1.5

## 2014-10-21 MED ORDER — POTASSIUM CHLORIDE CRYS ER 20 MEQ PO TBCR
40.0000 meq | EXTENDED_RELEASE_TABLET | Freq: Once | ORAL | Status: AC
  Administered 2014-10-21: 40 meq via ORAL
  Filled 2014-10-21: qty 2

## 2014-10-21 MED ORDER — SODIUM CHLORIDE 0.9 % IJ SOLN: 3.0000 mL | INTRAMUSCULAR | Status: DC | PRN

## 2014-10-21 MED ORDER — AMLODIPINE BESYLATE 10 MG PO TABS
10.0000 mg | ORAL_TABLET | Freq: Every day | ORAL | Status: DC
  Administered 2014-10-22 – 2014-10-28 (×7): 10 mg via ORAL
  Filled 2014-10-21 (×7): qty 1

## 2014-10-21 MED ORDER — SODIUM CHLORIDE 0.9 % IV SOLN
250.0000 mL | INTRAVENOUS | Status: DC | PRN
  Administered 2014-10-21: 250 mL via INTRAVENOUS

## 2014-10-21 MED ORDER — CLOPIDOGREL BISULFATE 75 MG PO TABS
75.0000 mg | ORAL_TABLET | Freq: Every day | ORAL | Status: DC
  Administered 2014-10-22 – 2014-10-28 (×7): 75 mg via ORAL
  Filled 2014-10-21 (×7): qty 1

## 2014-10-21 MED ORDER — HYDRALAZINE HCL 20 MG/ML IJ SOLN
10.0000 mg | Freq: Once | INTRAMUSCULAR | Status: AC
  Administered 2014-10-22: 10 mg via INTRAVENOUS
  Filled 2014-10-21: qty 1

## 2014-10-21 MED ORDER — HYDROCODONE-ACETAMINOPHEN 5-325 MG PO TABS
1.0000 | ORAL_TABLET | Freq: Four times a day (QID) | ORAL | Status: DC | PRN
Start: 2014-10-21 — End: 2014-10-28

## 2014-10-21 MED ORDER — BUPROPION HCL ER (XL) 150 MG PO TB24
150.0000 mg | ORAL_TABLET | Freq: Every day | ORAL | Status: DC
  Administered 2014-10-22 – 2014-10-28 (×7): 150 mg via ORAL
  Filled 2014-10-21 (×7): qty 1

## 2014-10-21 MED ORDER — ZOLPIDEM TARTRATE 5 MG PO TABS: 5.0000 mg | ORAL_TABLET | Freq: Every evening | ORAL | Status: DC | PRN

## 2014-10-21 MED ORDER — CARVEDILOL 25 MG PO TABS
25.0000 mg | ORAL_TABLET | Freq: Two times a day (BID) | ORAL | Status: DC
  Administered 2014-10-21 – 2014-10-28 (×15): 25 mg via ORAL
  Filled 2014-10-21 (×17): qty 1

## 2014-10-21 MED ORDER — FUROSEMIDE 10 MG/ML IJ SOLN
40.0000 mg | INTRAMUSCULAR | Status: DC
  Administered 2014-10-21 – 2014-10-25 (×11): 40 mg via INTRAVENOUS
  Filled 2014-10-21 (×14): qty 4

## 2014-10-21 MED ORDER — POTASSIUM CHLORIDE CRYS ER 20 MEQ PO TBCR: 20.0000 meq | EXTENDED_RELEASE_TABLET | Freq: Two times a day (BID) | ORAL | Status: DC

## 2014-10-21 MED ORDER — ONDANSETRON HCL 4 MG/2ML IJ SOLN
4.0000 mg | Freq: Four times a day (QID) | INTRAMUSCULAR | Status: DC | PRN
  Administered 2014-10-22: 4 mg via INTRAVENOUS
  Filled 2014-10-21: qty 2

## 2014-10-21 MED ORDER — NITROGLYCERIN 0.4 MG SL SUBL: 0.4000 mg | SUBLINGUAL_TABLET | SUBLINGUAL | Status: DC | PRN

## 2014-10-21 MED ORDER — ALPRAZOLAM 0.25 MG PO TABS
0.2500 mg | ORAL_TABLET | Freq: Two times a day (BID) | ORAL | Status: DC | PRN
Start: 2014-10-21 — End: 2014-10-28
  Administered 2014-10-22 – 2014-10-26 (×2): 0.25 mg via ORAL
  Filled 2014-10-21 (×2): qty 1

## 2014-10-21 MED ORDER — TRAMADOL HCL 50 MG PO TABS
50.0000 mg | ORAL_TABLET | Freq: Four times a day (QID) | ORAL | Status: DC | PRN
  Administered 2014-10-22 – 2014-10-26 (×2): 50 mg via ORAL
  Filled 2014-10-21 (×2): qty 1

## 2014-10-21 MED ORDER — ACETAMINOPHEN 325 MG PO TABS
650.0000 mg | ORAL_TABLET | ORAL | Status: DC | PRN
  Administered 2014-10-21 – 2014-10-26 (×3): 650 mg via ORAL
  Filled 2014-10-21 (×3): qty 2

## 2014-10-21 MED ORDER — TRAZODONE HCL 100 MG PO TABS
100.0000 mg | ORAL_TABLET | Freq: Every evening | ORAL | Status: DC | PRN
  Administered 2014-10-21 – 2014-10-27 (×7): 100 mg via ORAL
  Filled 2014-10-21 (×11): qty 1

## 2014-10-21 MED ORDER — HEPARIN (PORCINE) IN NACL 100-0.45 UNIT/ML-% IJ SOLN
1150.0000 [IU]/h | INTRAMUSCULAR | Status: DC
  Administered 2014-10-21 – 2014-10-22 (×2): 1150 [IU]/h via INTRAVENOUS
  Filled 2014-10-21 (×3): qty 250

## 2014-10-21 MED ORDER — POTASSIUM CHLORIDE CRYS ER 20 MEQ PO TBCR
20.0000 meq | EXTENDED_RELEASE_TABLET | Freq: Two times a day (BID) | ORAL | Status: DC
  Administered 2014-10-21: 20 meq via ORAL
  Filled 2014-10-21 (×3): qty 1

## 2014-10-21 MED ORDER — MAGNESIUM HYDROXIDE 400 MG/5ML PO SUSP
30.0000 mL | Freq: Every day | ORAL | Status: DC | PRN
  Administered 2014-10-21: 30 mL via ORAL
  Filled 2014-10-21: qty 30

## 2014-10-21 MED ORDER — ATORVASTATIN CALCIUM 80 MG PO TABS
80.0000 mg | ORAL_TABLET | Freq: Every day | ORAL | Status: DC
  Administered 2014-10-22 – 2014-10-28 (×7): 80 mg via ORAL
  Filled 2014-10-21 (×7): qty 1

## 2014-10-21 MED ORDER — NITROGLYCERIN IN D5W 200-5 MCG/ML-% IV SOLN
5.0000 ug/min | INTRAVENOUS | Status: DC
  Administered 2014-10-21: 5 ug/min via INTRAVENOUS
  Filled 2014-10-21: qty 250

## 2014-10-21 MED ORDER — HEPARIN BOLUS VIA INFUSION
4000.0000 [IU] | Freq: Once | INTRAVENOUS | Status: AC
  Administered 2014-10-21: 4000 [IU] via INTRAVENOUS
  Filled 2014-10-21: qty 4000

## 2014-10-21 MED ORDER — SODIUM CHLORIDE 0.9 % IJ SOLN
3.0000 mL | Freq: Two times a day (BID) | INTRAMUSCULAR | Status: DC
Start: 2014-10-21 — End: 2014-10-28
  Administered 2014-10-22: 3 mL via INTRAVENOUS
  Administered 2014-10-23: 10:00:00 via INTRAVENOUS
  Administered 2014-10-24 – 2014-10-28 (×8): 3 mL via INTRAVENOUS

## 2014-10-21 NOTE — ED Notes (Signed)
Pt. Sted, I've had chest pain and SOB started on Monday. She went to doctor on Monday and started having SOB with some dizziness

## 2014-10-21 NOTE — ED Notes (Signed)
Pt requested O2 to be removed. O2sat 92%ra

## 2014-10-21 NOTE — Telephone Encounter (Signed)
Patient called stating she was very short of breath, especially at night.  Advised patient she could be seen, but the appt was this afternoon.  Patient stated she was going to ED.  Advised patient to follow up with PCP.  Derl Barrow, RN

## 2014-10-21 NOTE — Progress Notes (Addendum)
ANTICOAGULATION CONSULT NOTE - Initial Consult  Pharmacy Consult for Heparin Indication: chest pain/ACS  Allergies  Allergen Reactions  . Lisinopril Swelling    Angioedema 06/10/11  . Aspirin Other (See Comments)    Upset stomach  . Chantix [Varenicline]     insomnia  . Penicillins Hives    Patient Measurements: Height: '6\' 1"'$  (185.4 cm) Weight: 183 lb (83.008 kg) IBW/kg (Calculated) : 75.4 Heparin Dosing Weight: 83 kg  Vital Signs: Temp: 98.1 F (36.7 C) (07/29 1137) BP: 185/79 mmHg (07/29 1303) Pulse Rate: 80 (07/29 1303)  Labs:  Recent Labs  10/21/14 1145  HGB 12.8  HCT 39.0  PLT 144*  CREATININE 0.98    Estimated Creatinine Clearance: 71.8 mL/min (by C-G formula based on Cr of 0.98).   Medical History: Past Medical History  Diagnosis Date  . Hyperlipidemia   . Hypertension   . GERD (gastroesophageal reflux disease)   . Leg pain   . Headache(784.0)   . Carotid artery occlusion   . COPD (chronic obstructive pulmonary disease)   . CAD (coronary artery disease)   . Peripheral vascular disease   . Tobacco abuse   . Shortness of breath dyspnea     Medications:   (Not in a hospital admission) Scheduled:  . [START ON 10/22/2014] amLODipine  10 mg Oral Daily  . [START ON 10/22/2014] aspirin EC  81 mg Oral Daily  . [START ON 10/22/2014] atorvastatin  80 mg Oral Daily  . [START ON 10/22/2014] buPROPion  150 mg Oral Daily  . carvedilol  25 mg Oral BID WC  . [START ON 10/22/2014] clopidogrel  75 mg Oral Daily  . sodium chloride  3 mL Intravenous Q12H   Infusions:  . sodium chloride      Assessment: 64yo female with history of CAD, HLD, HTN and GERD presents with dyspnea. Pharmacy is consulted to dose heparin for ACS/chest pain. Hgb 12.8, Plt 144, sCr 0.98, Trop 0.11, BNP 1223.3.  Goal of Therapy:  Heparin level 0.3-0.7 units/ml Monitor platelets by anticoagulation protocol: Yes   Plan:  Give 4000 units bolus x 1 Start heparin infusion at 1150  units/hr Check anti-Xa level in 6 hours and daily while on heparin Continue to monitor H&H and platelets  Andrey Cota. Diona Foley, PharmD Clinical Pharmacist Pager [phone] 10/21/2014,3:04 PM   Initial heparin level therapeutic at 0.67 To continue heparin at 1150 units / hr Recheck labs in AM  Thank you. Anette Guarneri, PharmD [phone] 22:15 pm

## 2014-10-21 NOTE — ED Provider Notes (Signed)
CSN: [id number]     Arrival date & time 10/21/14  1122 History   First MD Initiated Contact with Patient 10/21/14 1259     Chief Complaint  Patient presents with  . Chest Pain  . Shortness of Breath     (Consider location/radiation/quality/duration/timing/severity/associated sxs/prior Treatment) Patient is a 64 y.o. female presenting with chest pain and shortness of breath. The history is provided by the patient and medical records.  Chest Pain Associated symptoms: shortness of breath   Shortness of Breath Associated symptoms: chest pain    This is a 64 year old female with history of hypertension, hyperlipidemia, GERD, COPD, coronary artery disease status post stenting, presenting to the ED for chest pain or shortness of breath. Patient states this has been intermittent since Monday. She reports symptoms are worse at night when she attempts to lie down flat to sleep.  She has been sleeping on 2 pillows at night because of this.  She states she went to PCP on Monday because she was not feeling well.  Has also been having some lightheadedness.  Denies increased peripheral edema. Patient has no known hx of CHF.  No supplemental oxygen use at home.  2D echo on 09/12/14 with estimated 60-65% EF.  Patient followed by cardiology, Dr. Martinique.  She has already taken full dose ASA and her plavix this morning.  Past Medical History  Diagnosis Date  . Hyperlipidemia   . Hypertension   . GERD (gastroesophageal reflux disease)   . Leg pain   . Headache(784.0)   . Carotid artery occlusion   . COPD (chronic obstructive pulmonary disease)   . CAD (coronary artery disease)   . Peripheral vascular disease   . Tobacco abuse   . Shortness of breath dyspnea    Past Surgical History  Procedure Laterality Date  . Carotid endarterectomy  11/21/2007    left  . Angioplasty / stenting iliac  2010    right external iliac by Dr. Irish Lack  . Coronary angioplasty with stent placement  6/09    LAD 2.5x12  Promus  . Femoral-popliteal bypass graft  10/12    left Dr. Kellie Simmering  . Abdominal hysterectomy    . Cardiac catheterization  11/13/11    Left Heart Cath. with Coronary Angiogram  . Cardiac catheterization N/A 08/24/2014    Procedure: Left Heart Cath and Coronary Angiography;  Surgeon: Wellington Hampshire, MD;  Location: [facility];  Service: Cardiovascular;  Laterality: N/A;   Family History  Problem Relation Age of Onset  . Cancer Mother     BRAIN AND LUNG  . Hypertension Father   . Heart disease Father    History  Substance Use Topics  . Smoking status: Current Every Day Smoker -- 0.50 packs/day for 35 years    Types: Cigarettes  . Smokeless tobacco: Never Used     Comment: has not started wellbutrin yet  . Alcohol Use: No   OB History    No data available     Review of Systems  Respiratory: Positive for shortness of breath.   Cardiovascular: Positive for chest pain.  All other systems reviewed and are negative.     Allergies  Lisinopril; Aspirin; Chantix; and Penicillins  Home Medications   Prior to Admission medications   Medication Sig Start Date End Date Taking? Authorizing Provider  acetaminophen (TYLENOL) 325 MG tablet Take 2 tablets (650 mg total) by mouth every 4 (four) hours as needed for fever, headache or mild pain. 08/25/14   Lurena Joiner  K Kilroy, PA-C  amLODipine (NORVASC) 10 MG tablet Take 1 tablet (10 mg total) by mouth daily. 09/01/14   Archie Patten, MD  aspirin EC 81 MG tablet Take 1 tablet (81 mg total) by mouth daily. Patient not taking: Reported on 09/16/2014 08/25/14   Erlene Quan, PA-C  atorvastatin (LIPITOR) 80 MG tablet Take 1 tablet (80 mg total) by mouth daily. 09/01/14   Archie Patten, MD  buPROPion (WELLBUTRIN XL) 150 MG 24 hr tablet Take 1 tablet (150 mg total) by mouth daily. For 3 days, then increase '300mg'$  (2 tablets) daily 10/17/14   Archie Patten, MD  carvedilol (COREG) 25 MG tablet Take 1 tablet (25 mg total) by mouth 2 (two) times daily  with a meal. ONE TABLET IN AM ONE TABLET IN PM 09/07/14   Brittainy M Simmons, PA-C  clopidogrel (PLAVIX) 75 MG tablet Take 1 tablet (75 mg total) by mouth daily. 09/01/14   Archie Patten, MD  cyclobenzaprine (FLEXERIL) 10 MG tablet TAKE 1 TABLET BY MOUTH 3 TIMES DAILY AS NEEDED FOR MUSCLE SPASMS 10/17/14   Archie Patten, MD  HYDROcodone-acetaminophen (NORCO/VICODIN) 5-325 MG per tablet Take 1 tablet by mouth every 6 (six) hours as needed (breakthrough pain). 10/17/14   Archie Patten, MD  nitroGLYCERIN (NITROSTAT) 0.4 MG SL tablet Place 1 tablet (0.4 mg total) under the tongue every 5 (five) minutes as needed for chest pain (for severe chest pressure or tightness). 09/07/14   Brittainy Erie Noe, PA-C  traMADol (ULTRAM) 50 MG tablet Take 1 tablet (50 mg total) by mouth every 8 (eight) hours as needed. 10/17/14   Archie Patten, MD  traZODone (DESYREL) 100 MG tablet Take 1 tablet (100 mg total) by mouth at bedtime as needed for sleep. 09/16/14   Archie Patten, MD   BP 178/84 mmHg  Pulse 71  Temp(Src) 98.1 F (36.7 C)  Resp 17  Ht '6\' 1"'$  (1.854 m)  Wt 183 lb (83.008 kg)  BMI 24.15 kg/m2  SpO2 100%   Physical Exam  Constitutional: She is oriented to person, place, and time. She appears well-developed and well-nourished.  HENT:  Head: Normocephalic and atraumatic.  Mouth/Throat: Oropharynx is clear and moist.  Eyes: Conjunctivae and EOM are normal. Pupils are equal, round, and reactive to light.  Neck: Normal range of motion. Neck supple. JVD present.  Cardiovascular: Normal rate and regular rhythm.   Murmur heard. Pulmonary/Chest: Effort normal. No respiratory distress. She has no wheezes. She has rales.  Rales at bases, no distress, speaking in full sentences without difficulty  Abdominal: Soft. Bowel sounds are normal. There is no tenderness. There is no guarding.  Musculoskeletal: Normal range of motion.  Trace edema bilateral ankles  Neurological: She is alert and oriented to  person, place, and time.  Skin: Skin is warm and dry.  Psychiatric: She has a normal mood and affect.  Nursing note and vitals reviewed.   ED Course  Procedures (including critical care time)  CRITICAL CARE Performed by: Larene Pickett   Total critical care time: 45  Critical care time was exclusive of separately billable procedures and treating other patients.  Critical care was necessary to treat or prevent imminent or life-threatening deterioration.  Critical care was time spent personally by me on the following activities: development of treatment plan with patient and/or surrogate as well as nursing, discussions with consultants, evaluation of patient's response to treatment, examination of patient, obtaining history from patient or surrogate, ordering  and performing treatments and interventions, ordering and review of laboratory studies, ordering and review of radiographic studies, pulse oximetry and re-evaluation of patient's condition.  Medications  aspirin EC tablet 81 mg (not administered)  nitroGLYCERIN (NITROSTAT) SL tablet 0.4 mg (not administered)  acetaminophen (TYLENOL) tablet 650 mg (not administered)  ondansetron (ZOFRAN) injection 4 mg (not administered)  sodium chloride 0.9 % injection 3 mL (not administered)  sodium chloride 0.9 % injection 3 mL (not administered)  0.9 %  sodium chloride infusion (not administered)  ALPRAZolam (XANAX) tablet 0.25 mg (not administered)  zolpidem (AMBIEN) tablet 5 mg (not administered)  amLODipine (NORVASC) tablet 10 mg (not administered)  atorvastatin (LIPITOR) tablet 80 mg (not administered)  buPROPion (WELLBUTRIN XL) 24 hr tablet 150 mg (not administered)  carvedilol (COREG) tablet 25 mg (not administered)  clopidogrel (PLAVIX) tablet 75 mg (not administered)  cyclobenzaprine (FLEXERIL) tablet 7.5 mg (not administered)  HYDROcodone-acetaminophen (NORCO/VICODIN) 5-325 MG per tablet 1 tablet (not administered)  traMADol  (ULTRAM) tablet 50 mg (not administered)  traZODone (DESYREL) tablet 100 mg (not administered)  furosemide (LASIX) injection 40 mg (not administered)  potassium chloride SA (K-DUR,KLOR-CON) CR tablet 20 mEq (not administered)  nitroGLYCERIN 50 mg in dextrose 5 % 250 mL (0.2 mg/mL) infusion (not administered)  heparin bolus via infusion 4,000 Units (not administered)  heparin ADULT infusion 100 units/mL (25000 units/250 mL) (not administered)  furosemide (LASIX) injection 40 mg (40 mg Intravenous Given 10/21/14 1321)  potassium chloride SA (K-DUR,KLOR-CON) CR tablet 40 mEq (40 mEq Oral Given 10/21/14 1321)   Labs Review Labs Reviewed  BASIC METABOLIC PANEL - Abnormal; Notable for the following:    Potassium 3.1 (*)    Glucose, Bld 113 (*)    All other components within normal limits  CBC - Abnormal; Notable for the following:    WBC 12.4 (*)    RDW 15.7 (*)    Platelets 144 (*)    All other components within normal limits  BRAIN NATRIURETIC PEPTIDE - Abnormal; Notable for the following:    B Natriuretic Peptide 1223.3 (*)    All other components within normal limits  I-STAT TROPOININ, ED - Abnormal; Notable for the following:    Troponin i, poc 0.11 (*)    All other components within normal limits  TROPONIN I  TROPONIN I  TROPONIN I  MAGNESIUM  PROTIME-INR  HEPARIN LEVEL (UNFRACTIONATED)    Imaging Review Dg Chest 2 View  10/21/2014   CLINICAL DATA:  Slight cough, nausea, dizziness and shortness of breath a started yesterday. No fever.  EXAM: CHEST  2 VIEW  COMPARISON:  Chest x-rays dated 08/22/2014 and 10/10/2012. Chest CT dated 08/25/2014.  FINDINGS: Mild cardiomegaly is unchanged. Overall cardiomediastinal silhouette is stable in size and configuration. There is mild central pulmonary vascular congestion and mild bilateral interstitial edema, basilar predominant, suggesting congestive heart failure. There are small bilateral pleural effusions which are new, with probable adjacent  atelectasis.  Mild scarring/fibrosis is seen at each lung apex. The spiculated nodular density identified at the right lung apex on the previous chest CT, measuring 5 mm, is not able to be visualized on this chest x-ray.  IMPRESSION: 1. Cardiomegaly with central pulmonary vascular congestion and bilateral interstitial edema suggesting congestive heart failure/volume overload. 2. Small bilateral pleural effusions, with probable adjacent atelectasis. 3. The 5 mm right apical nodule described on previous chest CT of 08/25/2014 is not able to be visualized on this chest x-ray. Please note that a follow-up chest CT in 6-12  months was recommended on that previous report.   Electronically Signed   By: Franki Cabot M.D.   On: 10/21/2014 12:05     EKG Interpretation None      MDM   Final diagnoses:  Congestive heart failure, unspecified congestive heart failure chronicity, unspecified congestive heart failure type  Demand ischemia   64 year old female here with chest pain and exertional shortness of breath intermittently for the past 4 days.  Patient is in no acute distress on arrival. She does have JVD with an audible heart murmur and appears fluid overloaded. Her symptoms are consistent with CHF exacerbation. Labwork as above-- troponin elevated which I suspect is due to demand ischemia. Hypokalemia also noted which i have replaced.  Chest x-ray with frank pulmonary edema.  Patient has already had full dose ASA today, will give dose of IV lasix and discuss with cardiology for admission.  Cardiology has evaluated patient, will admit for further management.  Larene Pickett, PA-C 10/21/14 [place], MD 10/21/14 [phone]

## 2014-10-21 NOTE — H&P (Signed)
Patient ID: Leah [name] MRN: [id number], DOB/AGE: [birth year]   Admit date: 10/21/2014   Primary Physician: Kathrine Cords, MD Primary Cardiologist: Dr Martinique  HPI: 64 y/o AA female with a history of CAD, S/P PCI/ stenting of the mid LAD in June of 2009 with a 2.5 x 12 mm Promus stent. She has PVD as well and is status post left carotid endarterectomy in August of 2009, right external iliac stenting and bilateral renal artery stenting in 2009, and left femoropopliteal bypass grafting by Dr. Kellie Simmering in 2012. In August 2013 she had some chest pain. A Myoview study showed mild apical ischemia. Pt presented to 08/22/14 to the ER with chest pain.  Cath done 08/24/14 revealed distal stent restenosis of 70% that was not felt to be hemodynamically significant and the plan is for medical Rx. She was seen in f/u 09/07/14 and was doing well. An echo done during that admission showed an EF of 60-65% with moderate to severe MR and mild to moderate AR and grade 2 diastolic dysfunction.   She is seen now in the ED with a history of dyspnea that started 4 days ago. She says she has been compliant with medications. In the ED her BNP was 1223 and Troponin 0.11. She denies chest pain, arm pain, or jaw pain.   Problem List: Past Medical History  Diagnosis Date  . Hyperlipidemia   . Hypertension   . GERD (gastroesophageal reflux disease)   . Leg pain   . Headache(784.0)   . Carotid artery occlusion   . COPD (chronic obstructive pulmonary disease)   . CAD (coronary artery disease)   . Peripheral vascular disease   . Tobacco abuse   . Shortness of breath dyspnea     Past Surgical History  Procedure Laterality Date  . Carotid endarterectomy  11/21/2007    left  . Angioplasty / stenting iliac  2010    right external iliac by Dr. Irish Lack  . Coronary angioplasty with stent placement  6/09    LAD 2.5x12 Promus  . Femoral-popliteal bypass graft  10/12    left Dr. Kellie Simmering  . Abdominal hysterectomy      . Cardiac catheterization  11/13/11    Left Heart Cath. with Coronary Angiogram  . Cardiac catheterization N/A 08/24/2014    Procedure: Left Heart Cath and Coronary Angiography;  Surgeon: Wellington Hampshire, MD;  Location: [facility];  Service: Cardiovascular;  Laterality: N/A;     Allergies:  Allergies  Allergen Reactions  . Lisinopril Swelling    Angioedema 06/10/11  . Aspirin Other (See Comments)    Upset stomach  . Chantix [Varenicline]     insomnia  . Penicillins Hives     Home Medications No current facility-administered medications for this encounter.   Current Outpatient Prescriptions  Medication Sig Dispense Refill  . aspirin EC 81 MG tablet Take 1 tablet (81 mg total) by mouth daily.    . carvedilol (COREG) 25 MG tablet Take 1 tablet (25 mg total) by mouth 2 (two) times daily with a meal. ONE TABLET IN AM ONE TABLET IN PM 60 tablet 6  . clopidogrel (PLAVIX) 75 MG tablet Take 1 tablet (75 mg total) by mouth daily. 30 tablet 2  . acetaminophen (TYLENOL) 325 MG tablet Take 2 tablets (650 mg total) by mouth every 4 (four) hours as needed for fever, headache or mild pain.    Marland Kitchen amLODipine (NORVASC) 10 MG tablet Take 1 tablet (10 mg total)  by mouth daily. 30 tablet 2  . atorvastatin (LIPITOR) 80 MG tablet Take 1 tablet (80 mg total) by mouth daily. 30 tablet 2  . buPROPion (WELLBUTRIN XL) 150 MG 24 hr tablet Take 1 tablet (150 mg total) by mouth daily. For 3 days, then increase '300mg'$  (2 tablets) daily 60 tablet 0  . cyclobenzaprine (FLEXERIL) 10 MG tablet TAKE 1 TABLET BY MOUTH 3 TIMES DAILY AS NEEDED FOR MUSCLE SPASMS 70 tablet 0  . HYDROcodone-acetaminophen (NORCO/VICODIN) 5-325 MG per tablet Take 1 tablet by mouth every 6 (six) hours as needed (breakthrough pain). 15 tablet 0  . nitroGLYCERIN (NITROSTAT) 0.4 MG SL tablet Place 1 tablet (0.4 mg total) under the tongue every 5 (five) minutes as needed for chest pain (for severe chest pressure or tightness). 25 tablet 3  .  traMADol (ULTRAM) 50 MG tablet Take 1 tablet (50 mg total) by mouth every 8 (eight) hours as needed. 60 tablet 0  . traZODone (DESYREL) 100 MG tablet Take 1 tablet (100 mg total) by mouth at bedtime as needed for sleep. 30 tablet 2  . [DISCONTINUED] simvastatin (ZOCOR) 40 MG tablet Take 40 mg by mouth at bedtime.        Family History  Problem Relation Age of Onset  . Cancer Mother     BRAIN AND LUNG  . Hypertension Father   . Heart disease Father      History   Social History  . Marital Status: Divorced    Spouse Name: N/A  . Number of Children: 4  . Years of Education: N/A   Occupational History  .  Lab [employer]   Social History Main Topics  . Smoking status: Current Every Day Smoker -- 0.50 packs/day for 35 years    Types: Cigarettes  . Smokeless tobacco: Never Used     Comment: has not started wellbutrin yet  . Alcohol Use: No  . Drug Use: No  . Sexual Activity: Not on file   Other Topics Concern  . Not on file   Social History Narrative     Review of Systems: General: negative for chills, fever, night sweats or weight changes.  Cardiovascular: negative for chest pain, dyspnea on exertion, edema, orthopnea, palpitations, paroxysmal nocturnal dyspnea or shortness of breath Dermatological: negative for rash Respiratory: negative for cough or wheezing Urologic: negative for hematuria Abdominal: negative for nausea, vomiting, diarrhea, bright red blood per rectum, melena, or hematemesis Neurologic: negative for visual changes, syncope, or dizziness All other systems reviewed and are otherwise negative except as noted above.  Physical Exam: Blood pressure 185/79, pulse 80, temperature 98.1 F (36.7 C), resp. rate 22, height '6\' 1"'$  (1.854 m), weight 183 lb (83.008 kg), SpO2 100 %.  General appearance: alert, cooperative and no distress Neck: no carotid bruit and JVP 10 cm. +HJR.  Lungs: decreased Lt base Heart: regular rate and rhythm and 3/6 MR murmur, 2/6 early  diastolic murmur along there sternal border. Abdomen: soft, non-tender; bowel sounds normal; no masses,  no organomegaly Extremities: trace edema Pulses: 2+ and symmetric Skin: Skin color, texture, turgor normal. No rashes or lesions Neurologic: Grossly normal  Labs:   Results for orders placed or performed during the hospital encounter of 10/21/14 (from the past 24 hour(s))  Basic metabolic panel     Status: Abnormal   Collection Time: 10/21/14 11:45 AM  Result Value Ref Range   Sodium 141 135 - 145 mmol/L   Potassium 3.1 (L) 3.5 - 5.1 mmol/L   Chloride  108 101 - 111 mmol/L   CO2 24 22 - 32 mmol/L   Glucose, Bld 113 (H) 65 - 99 mg/dL   BUN 11 6 - 20 mg/dL   Creatinine, Ser 0.98 0.44 - 1.00 mg/dL   Calcium 9.2 8.9 - 10.3 mg/dL   GFR calc non Af Amer >60 >60 mL/min   GFR calc Af Amer >60 >60 mL/min   Anion gap 9 5 - 15  CBC     Status: Abnormal   Collection Time: 10/21/14 11:45 AM  Result Value Ref Range   WBC 12.4 (H) 4.0 - 10.5 K/uL   RBC 4.90 3.87 - 5.11 MIL/uL   Hemoglobin 12.8 12.0 - 15.0 g/dL   HCT 39.0 36.0 - 46.0 %   MCV 79.6 78.0 - 100.0 fL   MCH 26.1 26.0 - 34.0 pg   MCHC 32.8 30.0 - 36.0 g/dL   RDW 15.7 (H) 11.5 - 15.5 %   Platelets 144 (L) 150 - 400 K/uL  Brain natriuretic peptide     Status: Abnormal   Collection Time: 10/21/14 11:45 AM  Result Value Ref Range   B Natriuretic Peptide 1223.3 (H) 0.0 - 100.0 pg/mL  I-stat troponin, ED     Status: Abnormal   Collection Time: 10/21/14 12:20 PM  Result Value Ref Range   Troponin i, poc 0.11 (HH) 0.00 - 0.08 ng/mL   Comment NOTIFIED PHYSICIAN    Comment 3             Radiology/Studies: Dg Chest 2 View  10/21/2014   CLINICAL DATA:  Slight cough, nausea, dizziness and shortness of breath a started yesterday. No fever.  EXAM: CHEST  2 VIEW  COMPARISON:  Chest x-rays dated 08/22/2014 and 10/10/2012. Chest CT dated 08/25/2014.  FINDINGS: Mild cardiomegaly is unchanged. Overall cardiomediastinal silhouette is stable  in size and configuration. There is mild central pulmonary vascular congestion and mild bilateral interstitial edema, basilar predominant, suggesting congestive heart failure. There are small bilateral pleural effusions which are new, with probable adjacent atelectasis.  Mild scarring/fibrosis is seen at each lung apex. The spiculated nodular density identified at the right lung apex on the previous chest CT, measuring 5 mm, is not able to be visualized on this chest x-ray.  IMPRESSION: 1. Cardiomegaly with central pulmonary vascular congestion and bilateral interstitial edema suggesting congestive heart failure/volume overload. 2. Small bilateral pleural effusions, with probable adjacent atelectasis. 3. The 5 mm right apical nodule described on previous chest CT of 08/25/2014 is not able to be visualized on this chest x-ray. Please note that a follow-up chest CT in 6-12 months was recommended on that previous report.   Electronically Signed   By: Franki Cabot M.D.   On: 10/21/2014 12:05    EKG:NSR, septal Qs, one PVC  ASSESSMENT AND PLAN:  Principal Problem:   Acute on chronic diastolic congestive heart failure Active Problems:   CAD S/P LAD DES 2009   Essential hypertension, benign   PVD- s/p multiple proceedures   Moderate to severe mitral regurgitation   Troponin level elevated   Diastolic dysfunction, grade 2 by echo June 2016   Hyperlipidemia   History of angioedema with ACE 2013   Pulmonary nodule, right-needs repeat CT in Dec 2016   Smoker- 5 a day   PLAN: Admit, diurese, r/o MI.  Lasix and K+ ordered in ED. Consider restudy if Troponin climbs.   Henri Medal, PA-C 10/21/2014, 2:41 PM [phone]  Patient seen with PA, agree with the above  note.  1. Acute diastolic CHF: Patient has had marked orthopnea and dyspnea walking around the house for the last 2 days.  Prior, she only had dyspnea with significant exertion.  She is volume overloaded on exam with elevated JVP/HJR.   She has prominent MR murmur on exam, and I am concerned that CHF may be related to severe MR.  BP is high.  - Lasix 40 mg IV every 8 hrs, replace K.  - NTG gtt for now given SBP 180s, goal SBP<140.  NTG should help some with afterload reduction in setting of presumed severe MR.  2. Mitral regurgitation: Appeared rheumatic, moderate to severe on last echo.  Prominent MR murmur.  Concerned that she has symptomatic severe MR.   - Will arrange for TEE on Monday to evaluate.  3. Aortic insufficiency: She has diastolic murmur.  Mild to moderate on last echo.  4. CAD: Recent cath with moderate (70%) ISR in LAD stent.  Was not thought to be flow-limiting.  No chest pain this admission but troponin 0.1.  Probably demand ischemia but will cycle CEs.  Will use heparin gtt for now, if troponin trend is flat, can stop this. Continue ASA, statin, Plavix.   Loralie Champagne 10/21/2014 3:08 PM

## 2014-10-22 LAB — BASIC METABOLIC PANEL
Anion gap: 9 (ref 5–15)
BUN: 12 mg/dL (ref 6–20)
CO2: 27 mmol/L (ref 22–32)
Calcium: 9 mg/dL (ref 8.9–10.3)
Chloride: 105 mmol/L (ref 101–111)
Creatinine, Ser: 0.85 mg/dL (ref 0.44–1.00)
GFR calc Af Amer: >60 mL/min (ref >60)
GFR calc non Af Amer: >60 mL/min (ref >60)
Glucose, Bld: 126 mg/dL — ABNORMAL HIGH (ref 65–99)
Potassium: 2.9 mmol/L — ABNORMAL LOW (ref 3.5–5.1)
Sodium: 141 mmol/L (ref 135–145)

## 2014-10-22 LAB — TROPONIN I: Troponin I: 0.11 ng/mL — ABNORMAL HIGH (ref <0.031)

## 2014-10-22 LAB — CBC
HCT: 40 % (ref 36.0–46.0)
Hemoglobin: 13.3 g/dL (ref 12.0–15.0)
MCH: 26.2 pg (ref 26.0–34.0)
MCHC: 33.3 g/dL (ref 30.0–36.0)
MCV: 78.9 fL (ref 78.0–100.0)
Platelets: 142 10*3/uL — ABNORMAL LOW (ref 150–400)
RBC: 5.07 MIL/uL (ref 3.87–5.11)
RDW: 15.6 % — ABNORMAL HIGH (ref 11.5–15.5)
WBC: 12.5 10*3/uL — ABNORMAL HIGH (ref 4.0–10.5)

## 2014-10-22 LAB — HEPARIN LEVEL (UNFRACTIONATED): Heparin Unfractionated: 0.64 IU/mL (ref 0.30–0.70)

## 2014-10-22 MED ORDER — POTASSIUM CHLORIDE CRYS ER 20 MEQ PO TBCR
40.0000 meq | EXTENDED_RELEASE_TABLET | Freq: Three times a day (TID) | ORAL | Status: DC
  Administered 2014-10-22 – 2014-10-24 (×9): 40 meq via ORAL
  Filled 2014-10-22 (×11): qty 2

## 2014-10-22 MED ORDER — HYDRALAZINE HCL 20 MG/ML IJ SOLN
15.0000 mg | Freq: Once | INTRAMUSCULAR | Status: AC
  Administered 2014-10-22: 15 mg via INTRAVENOUS
  Filled 2014-10-22: qty 1

## 2014-10-22 NOTE — Progress Notes (Signed)
Patient ID: Leah [name], female   DOB: 01-31-1951, 64 y.o.   MRN: [id number]    Subjective:  Rough night sleeping due to dyspnea and mild cough   Objective:  Filed Vitals:   10/22/14 0500 10/22/14 0506 10/22/14 0600 10/22/14 0700  BP: 130/56  140/54 114/52  Pulse: 62  65 59  Temp:  99.2 F (37.3 C)  98.6 F (37 C)  TempSrc:  Oral  Oral  Resp: '21  20 18  '$ Height:      Weight:      SpO2: 89%  91% 91%    Intake/Output from previous day:  Intake/Output Summary (Last 24 hours) at 10/22/14 4431 Last data filed at 10/22/14 0700  Gross per 24 hour  Intake 1331.31 ml  Output   3775 ml  Net -2443.69 ml    Physical Exam: Affect appropriate Black female  HEENT: edentulous  Neck supple with no adenopathy JVP normal no bruits no thyromegaly Lungs clear with no wheezing and good diaphragmatic motion Heart:  S1/S2 loud MR  murmur, no rub, gallop or click PMI normal Abdomen: benighn, BS positve, no tenderness, no AAA no bruit.  No HSM or HJR Distal pulses intact with no bruits No edema Neuro non-focal Skin warm and dry No muscular weakness   Lab Results: Basic Metabolic Panel:  Recent Labs  10/21/14 1145 10/21/14 1602 10/22/14 0550  NA 141  --  141  K 3.1*  --  2.9*  CL 108  --  105  CO2 24  --  27  GLUCOSE 113*  --  126*  BUN 11  --  12  CREATININE 0.98  --  0.85  CALCIUM 9.2  --  9.0  MG  --  1.7  --    CBC:  Recent Labs  10/21/14 1145 10/22/14 0550  WBC 12.4* 12.5*  HGB 12.8 13.3  HCT 39.0 40.0  MCV 79.6 78.9  PLT 144* 142*   Cardiac Enzymes:  Recent Labs  10/21/14 1602 10/21/14 2124 10/22/14 0550  TROPONINI 0.08* 0.10* 0.11*    Imaging: Dg Chest 2 View  10/21/2014   CLINICAL DATA:  Slight cough, nausea, dizziness and shortness of breath a started yesterday. No fever.  EXAM: CHEST  2 VIEW  COMPARISON:  Chest x-rays dated 08/22/2014 and 10/10/2012. Chest CT dated 08/25/2014.  FINDINGS: Mild cardiomegaly is unchanged. Overall  cardiomediastinal silhouette is stable in size and configuration. There is mild central pulmonary vascular congestion and mild bilateral interstitial edema, basilar predominant, suggesting congestive heart failure. There are small bilateral pleural effusions which are new, with probable adjacent atelectasis.  Mild scarring/fibrosis is seen at each lung apex. The spiculated nodular density identified at the right lung apex on the previous chest CT, measuring 5 mm, is not able to be visualized on this chest x-ray.  IMPRESSION: 1. Cardiomegaly with central pulmonary vascular congestion and bilateral interstitial edema suggesting congestive heart failure/volume overload. 2. Small bilateral pleural effusions, with probable adjacent atelectasis. 3. The 5 mm right apical nodule described on previous chest CT of 08/25/2014 is not able to be visualized on this chest x-ray. Please note that a follow-up chest CT in 6-12 months was recommended on that previous report.   Electronically Signed   By: Franki Cabot M.D.   On: 10/21/2014 12:05    Cardiac Studies:  ECG:    Telemetry:  NSR no arrhythmia  10/22/2014   Echo:  Reviewed 08/25/14 Study Conclusions  - Left ventricle: The cavity size was normal.  There was mild concentric hypertrophy. Systolic function was normal. The estimated ejection fraction was in the range of 60% to 65%. Wall motion was normal; there were no regional wall motion abnormalities. Features are consistent with a pseudonormal left ventricular filling pattern, with concomitant abnormal relaxation and increased filling pressure (grade 2 diastolic dysfunction). - Aortic valve: There was mild to moderate regurgitation directed centrally in the LVOT. - Mitral valve: Mild thickening, involving the leaflet margin more than the base, consistent with rheumatic disease. There was moderate to severe regurgitation directed centrally. Valve area by pressure half-time: 2.42 cm^2. -  Left atrium: The atrium was moderately to severely dilated. - Pulmonary arteries: PA peak pressure: 32 mm Hg (S).   Medications:   . amLODipine  10 mg Oral Daily  . aspirin EC  81 mg Oral Daily  . atorvastatin  80 mg Oral Daily  . buPROPion  150 mg Oral Daily  . carvedilol  25 mg Oral BID WC  . clopidogrel  75 mg Oral Daily  . furosemide  40 mg Intravenous [id number]  . potassium chloride  20 mEq Oral BID  . sodium chloride  3 mL Intravenous Q12H     . heparin 1,150 Units/hr (10/22/14 0920)  . nitroGLYCERIN 10 mcg/min (10/22/14 0225)    Assessment/Plan:  CHF:  See note by Dr Aundra Dubin ? Due to valvular disease and severe MR  Orders written and Dr Acie Fredrickson should do with 3D imaging  Continue bid Iv lasix supple K repeat BMET in am.  Repeat CXR in am  CAD:  Recent cath stable mild elevation in troponin likely from CHF cath 08/24/14 70% distal LAD stent restenosis medical Rx on plavix   Jenkins Rouge 10/22/2014, 9:38 AM

## 2014-10-22 NOTE — Progress Notes (Signed)
Patient's SBP continues to range 170-180's. Patient not tolerating headache from nitro drip at this time. Patient has been medicated with Prn pain meds and nitro drip decreased. Dr. Eula Fried notified and 2nd dose of hydralazine ordered for BP. Will continue to monitor.

## 2014-10-22 NOTE — Progress Notes (Signed)
ANTICOAGULATION CONSULT NOTE - Follow Up Consult  Pharmacy Consult for heparin Indication: chest pain/ACS  Allergies  Allergen Reactions  . Lisinopril Swelling    Angioedema 06/10/11  . Aspirin Other (See Comments)    Upset stomach  . Chantix [Varenicline]     insomnia  . Penicillins Hives    Patient Measurements: Height: '6\' 1"'$  (185.4 cm) Weight: 181 lb (82.1 kg) IBW/kg (Calculated) : 75.4 Heparin Dosing Weight: 82.1  Vital Signs: Temp: 98.6 F (37 C) (07/30 0700) Temp Source: Oral (07/30 0700) BP: 114/52 mmHg (07/30 0700) Pulse Rate: 59 (07/30 0700)  Labs:  Recent Labs  10/21/14 1145 10/21/14 1602 10/21/14 2124 10/22/14 0550  HGB 12.8  --   --  13.3  HCT 39.0  --   --  40.0  PLT 144*  --   --  142*  LABPROT  --  15.3*  --   --   INR  --  1.19  --   --   HEPARINUNFRC  --   --  0.67 0.64  CREATININE 0.98  --   --  0.85  TROPONINI  --  0.08* 0.10* 0.11*    Estimated Creatinine Clearance: 82.7 mL/min (by C-G formula based on Cr of 0.85).   Medications:  Prescriptions prior to admission  Medication Sig Dispense Refill Last Dose  . acetaminophen (TYLENOL) 325 MG tablet Take 2 tablets (650 mg total) by mouth every 4 (four) hours as needed for fever, headache or mild pain.   unkn  . amLODipine (NORVASC) 10 MG tablet Take 1 tablet (10 mg total) by mouth daily. 30 tablet 2 10/21/2014 at Unknown time  . aspirin EC 81 MG tablet Take 1 tablet (81 mg total) by mouth daily.   10/21/2014 at Unknown time  . atorvastatin (LIPITOR) 80 MG tablet Take 1 tablet (80 mg total) by mouth daily. 30 tablet 2 10/20/2014 at Unknown time  . buPROPion (WELLBUTRIN XL) 150 MG 24 hr tablet Take 1 tablet (150 mg total) by mouth daily. For 3 days, then increase '300mg'$  (2 tablets) daily 60 tablet 0 10/20/2014 at Unknown time  . carvedilol (COREG) 25 MG tablet Take 1 tablet (25 mg total) by mouth 2 (two) times daily with a meal. ONE TABLET IN AM ONE TABLET IN PM 60 tablet 6 10/19/2014 at 0800  .  clopidogrel (PLAVIX) 75 MG tablet Take 1 tablet (75 mg total) by mouth daily. 30 tablet 2 10/21/2014 at Unknown time  . cyclobenzaprine (FLEXERIL) 10 MG tablet TAKE 1 TABLET BY MOUTH 3 TIMES DAILY AS NEEDED FOR MUSCLE SPASMS 70 tablet 0 10/20/2014 at Unknown time  . HYDROcodone-acetaminophen (NORCO/VICODIN) 5-325 MG per tablet Take 1 tablet by mouth every 6 (six) hours as needed (breakthrough pain). 15 tablet 0 10/20/2014 at Unknown time  . traMADol (ULTRAM) 50 MG tablet Take 1 tablet (50 mg total) by mouth every 8 (eight) hours as needed. 60 tablet 0 unkn  . traZODone (DESYREL) 100 MG tablet Take 1 tablet (100 mg total) by mouth at bedtime as needed for sleep. 30 tablet 2 10/20/2014 at Unknown time    Assessment: 65 yo woman admitted 10/21/2014 with dyspnea x 4 days. BNP 1223 and Trop 0.08 in ED.   Pharmacy consulted to dose heparin.  PMH CAD s/p DES x 1 to mid LAD, HLD, HTN, GERD  AC/Heme: ACS r/o CBC stable, HL 0.64 therapeutic (goal 0.3-0.7) On Heparin 1150 units/h  Goal of Therapy:  Heparin level 0.3-0.7 units/ml Monitor platelets by anticoagulation protocol: Yes  Plan:  Continue heparin 1150 units/h Daily HL and CBC F/U duration of heparin gtt  Wynelle Fanny, Pharm.D. 10/22/2014,9:18 AM

## 2014-10-23 ENCOUNTER — Inpatient Hospital Stay (HOSPITAL_COMMUNITY): Payer: Medicaid Other

## 2014-10-23 LAB — BASIC METABOLIC PANEL
ANION GAP: 7 (ref 5–15)
BUN: 16 mg/dL (ref 6–20)
CALCIUM: 9 mg/dL (ref 8.9–10.3)
CO2: 25 mmol/L (ref 22–32)
Chloride: 107 mmol/L (ref 101–111)
Creatinine, Ser: 1.07 mg/dL — ABNORMAL HIGH (ref 0.44–1.00)
GFR calc Af Amer: >60 mL/min (ref >60)
GFR calc non Af Amer: 55 mL/min — ABNORMAL LOW (ref >60)
Glucose, Bld: 97 mg/dL (ref 65–99)
Potassium: 4 mmol/L (ref 3.5–5.1)
SODIUM: 139 mmol/L (ref 135–145)

## 2014-10-23 LAB — CBC
HEMATOCRIT: 38.7 % (ref 36.0–46.0)
HEMOGLOBIN: 12.7 g/dL (ref 12.0–15.0)
MCH: 26.2 pg (ref 26.0–34.0)
MCHC: 32.8 g/dL (ref 30.0–36.0)
MCV: 80 fL (ref 78.0–100.0)
Platelets: 144 10*3/uL — ABNORMAL LOW (ref 150–400)
RBC: 4.84 MIL/uL (ref 3.87–5.11)
RDW: 16 % — ABNORMAL HIGH (ref 11.5–15.5)
WBC: 10.5 10*3/uL (ref 4.0–10.5)

## 2014-10-23 MED ORDER — SODIUM CHLORIDE 0.9 % IV SOLN
INTRAVENOUS | Status: DC
  Administered 2014-10-23: 20:00:00 via INTRAVENOUS

## 2014-10-23 NOTE — Progress Notes (Signed)
Utilization Review Completed.Leah [name] T7/31/2016  

## 2014-10-23 NOTE — Progress Notes (Signed)
Patient ID: Leah [name], female   DOB: January 19, 1951, 64 y.o.   MRN: [id number]    Subjective:  Much improved  No dyspnea Discussed TEE for am   Objective:  Filed Vitals:   10/23/14 0016 10/23/14 0031 10/23/14 0329 10/23/14 0735  BP:   125/57 125/65  Pulse: 60 62 62   Temp:   98.2 F (36.8 C) 98.5 F (36.9 C)  TempSrc:   Oral Oral  Resp: '22 17 19   '$ Height:      Weight:   80.7 kg (177 lb 14.6 oz)   SpO2: 86% 96% 92% 95%    Intake/Output from previous day:  Intake/Output Summary (Last 24 hours) at 10/23/14 4098 Last data filed at 10/23/14 0600  Gross per 24 hour  Intake   1435 ml  Output   1650 ml  Net   -215 ml    Physical Exam: Affect appropriate Black female  HEENT: edentulous  Neck supple with no adenopathy JVP normal no bruits no thyromegaly Lungs clear with no wheezing and good diaphragmatic motion Heart:  S1/S2 loud MR  murmur, no rub, gallop or click PMI normal Abdomen: benighn, BS positve, no tenderness, no AAA no bruit.  No HSM or HJR Distal pulses intact with no bruits No edema Neuro non-focal Skin warm and dry No muscular weakness   Lab Results: Basic Metabolic Panel:  Recent Labs  10/21/14 1602 10/22/14 0550 10/23/14 0231  NA  --  141 139  K  --  2.9* 4.0  CL  --  105 107  CO2  --  27 25  GLUCOSE  --  126* 97  BUN  --  12 16  CREATININE  --  0.85 1.07*  CALCIUM  --  9.0 9.0  MG 1.7  --   --    CBC:  Recent Labs  10/22/14 0550 10/23/14 0231  WBC 12.5* 10.5  HGB 13.3 12.7  HCT 40.0 38.7  MCV 78.9 80.0  PLT 142* 144*   Cardiac Enzymes:  Recent Labs  10/21/14 1602 10/21/14 2124 10/22/14 0550  TROPONINI 0.08* 0.10* 0.11*    Imaging: Dg Chest 2 View  10/21/2014   CLINICAL DATA:  Slight cough, nausea, dizziness and shortness of breath a started yesterday. No fever.  EXAM: CHEST  2 VIEW  COMPARISON:  Chest x-rays dated 08/22/2014 and 10/10/2012. Chest CT dated 08/25/2014.  FINDINGS: Mild cardiomegaly is unchanged.  Overall cardiomediastinal silhouette is stable in size and configuration. There is mild central pulmonary vascular congestion and mild bilateral interstitial edema, basilar predominant, suggesting congestive heart failure. There are small bilateral pleural effusions which are new, with probable adjacent atelectasis.  Mild scarring/fibrosis is seen at each lung apex. The spiculated nodular density identified at the right lung apex on the previous chest CT, measuring 5 mm, is not able to be visualized on this chest x-ray.  IMPRESSION: 1. Cardiomegaly with central pulmonary vascular congestion and bilateral interstitial edema suggesting congestive heart failure/volume overload. 2. Small bilateral pleural effusions, with probable adjacent atelectasis. 3. The 5 mm right apical nodule described on previous chest CT of 08/25/2014 is not able to be visualized on this chest x-ray. Please note that a follow-up chest CT in 6-12 months was recommended on that previous report.   Electronically Signed   By: Franki Cabot M.D.   On: 10/21/2014 12:05    Cardiac Studies:  ECG:  SR PAC lateral infarct no acute changes    Telemetry:  NSR no arrhythmia  10/23/2014  Echo:  Reviewed 08/25/14 Study Conclusions  - Left ventricle: The cavity size was normal. There was mild concentric hypertrophy. Systolic function was normal. The estimated ejection fraction was in the range of 60% to 65%. Wall motion was normal; there were no regional wall motion abnormalities. Features are consistent with a pseudonormal left ventricular filling pattern, with concomitant abnormal relaxation and increased filling pressure (grade 2 diastolic dysfunction). - Aortic valve: There was mild to moderate regurgitation directed centrally in the LVOT. - Mitral valve: Mild thickening, involving the leaflet margin more than the base, consistent with rheumatic disease. There was moderate to severe regurgitation directed centrally.  Valve area by pressure half-time: 2.42 cm^2. - Left atrium: The atrium was moderately to severely dilated. - Pulmonary arteries: PA peak pressure: 32 mm Hg (S).   Medications:   . amLODipine  10 mg Oral Daily  . aspirin EC  81 mg Oral Daily  . atorvastatin  80 mg Oral Daily  . buPROPion  150 mg Oral Daily  . carvedilol  25 mg Oral BID WC  . clopidogrel  75 mg Oral Daily  . furosemide  40 mg Intravenous [id number]  . potassium chloride  40 mEq Oral TID  . sodium chloride  3 mL Intravenous Q12H     . nitroGLYCERIN Stopped (10/22/14 1200)    Assessment/Plan:  CHF:  See note by Dr Aundra Dubin ? Due to valvular disease and severe MR  Orders written and Dr Acie Fredrickson should do with 3D imaging  Continue bid Iv lasix K repleted .  Repeat CXR today with lateral decubitus  CAD:  Recent cath stable mild elevation in troponin likely from CHF cath 08/24/14 70% distal LAD stent restenosis medical Rx on plavix   TEE orders written  Jenkins Rouge 10/23/2014, 8:24 AM

## 2014-10-24 ENCOUNTER — Inpatient Hospital Stay (HOSPITAL_COMMUNITY): Payer: Medicaid Other

## 2014-10-24 ENCOUNTER — Other Ambulatory Visit: Payer: Self-pay | Admitting: *Deleted

## 2014-10-24 ENCOUNTER — Encounter (HOSPITAL_COMMUNITY): Payer: Self-pay | Admitting: *Deleted

## 2014-10-24 ENCOUNTER — Encounter (HOSPITAL_COMMUNITY)
Admission: EM | Disposition: A | Discharge status: Home / Self Care | Payer: Self-pay | Source: Home / Self Care | Attending: Cardiology

## 2014-10-24 DIAGNOSIS — I340 Nonrheumatic mitral (valve) insufficiency: Secondary | ICD-10-CM

## 2014-10-24 HISTORY — PX: TEE WITHOUT CARDIOVERSION: SHX5443

## 2014-10-24 LAB — BASIC METABOLIC PANEL
Anion gap: 7 (ref 5–15)
BUN: 13 mg/dL (ref 6–20)
CO2: 27 mmol/L (ref 22–32)
Calcium: 9.7 mg/dL (ref 8.9–10.3)
Chloride: 105 mmol/L (ref 101–111)
Creatinine, Ser: 1.12 mg/dL — ABNORMAL HIGH (ref 0.44–1.00)
GFR calc non Af Amer: 52 mL/min — ABNORMAL LOW (ref >60)
Glucose, Bld: 95 mg/dL (ref 65–99)
POTASSIUM: 4.1 mmol/L (ref 3.5–5.1)
SODIUM: 139 mmol/L (ref 135–145)

## 2014-10-24 LAB — CBC
HCT: 40.5 % (ref 36.0–46.0)
Hemoglobin: 13.5 g/dL (ref 12.0–15.0)
MCH: 26.3 pg (ref 26.0–34.0)
MCHC: 33.3 g/dL (ref 30.0–36.0)
MCV: 78.8 fL (ref 78.0–100.0)
PLATELETS: 149 10*3/uL — AB (ref 150–400)
RBC: 5.14 MIL/uL — ABNORMAL HIGH (ref 3.87–5.11)
RDW: 15.7 % — AB (ref 11.5–15.5)
WBC: 10.5 10*3/uL (ref 4.0–10.5)

## 2014-10-24 SURGERY — ECHOCARDIOGRAM, TRANSESOPHAGEAL
Anesthesia: Moderate Sedation

## 2014-10-24 SURGERY — CANCELLED PROCEDURE

## 2014-10-24 MED ORDER — FENTANYL CITRATE (PF) 100 MCG/2ML IJ SOLN
INTRAMUSCULAR | Status: DC | PRN
  Administered 2014-10-24 (×2): 25 ug via INTRAVENOUS

## 2014-10-24 MED ORDER — BUTAMBEN-TETRACAINE-BENZOCAINE 2-2-14 % EX AERO
INHALATION_SPRAY | CUTANEOUS | Status: DC | PRN
  Administered 2014-10-24: 2 via TOPICAL

## 2014-10-24 MED ORDER — MIDAZOLAM HCL 5 MG/ML IJ SOLN
INTRAMUSCULAR | Status: AC
  Filled 2014-10-24: qty 2

## 2014-10-24 MED ORDER — PHENOL 1.4 % MT LIQD
1.0000 | OROMUCOSAL | Status: DC | PRN
  Administered 2014-10-24: 1 via OROMUCOSAL
  Filled 2014-10-24: qty 177

## 2014-10-24 MED ORDER — MIDAZOLAM HCL 10 MG/2ML IJ SOLN
INTRAMUSCULAR | Status: DC | PRN
  Administered 2014-10-24 (×3): 2 mg via INTRAVENOUS

## 2014-10-24 MED ORDER — FENTANYL CITRATE (PF) 100 MCG/2ML IJ SOLN
INTRAMUSCULAR | Status: AC
  Filled 2014-10-24: qty 4

## 2014-10-24 NOTE — Interval H&P Note (Signed)
History and Physical Interval Note:  10/24/2014 10:23 AM  Leah [name]  has presented today for surgery, with the diagnosis of heart failure  The various methods of treatment have been discussed with the patient and family. After consideration of risks, benefits and other options for treatment, the patient has consented to  Procedure(s): TRANSESOPHAGEAL ECHOCARDIOGRAM (TEE) (N/A) as a surgical intervention .  The patient's history has been reviewed, patient examined, no change in status, stable for surgery.  I have reviewed the patient's chart and labs.  Questions were answered to the patient's satisfaction.     Zavion Sleight, Wonda Cheng

## 2014-10-24 NOTE — H&P (View-Only) (Signed)
Patient ID: Leah [name], female   DOB: Jun 02, 1950, 64 y.o.   MRN: [id number]    Subjective:  Much improved  No dyspnea Discussed TEE for am   Objective:  Filed Vitals:   10/23/14 0016 10/23/14 0031 10/23/14 0329 10/23/14 0735  BP:   125/57 125/65  Pulse: 60 62 62   Temp:   98.2 F (36.8 C) 98.5 F (36.9 C)  TempSrc:   Oral Oral  Resp: '22 17 19   '$ Height:      Weight:   80.7 kg (177 lb 14.6 oz)   SpO2: 86% 96% 92% 95%    Intake/Output from previous day:  Intake/Output Summary (Last 24 hours) at 10/23/14 2025 Last data filed at 10/23/14 0600  Gross per 24 hour  Intake   1435 ml  Output   1650 ml  Net   -215 ml    Physical Exam: Affect appropriate Black female  HEENT: edentulous  Neck supple with no adenopathy JVP normal no bruits no thyromegaly Lungs clear with no wheezing and good diaphragmatic motion Heart:  S1/S2 loud MR  murmur, no rub, gallop or click PMI normal Abdomen: benighn, BS positve, no tenderness, no AAA no bruit.  No HSM or HJR Distal pulses intact with no bruits No edema Neuro non-focal Skin warm and dry No muscular weakness   Lab Results: Basic Metabolic Panel:  Recent Labs  10/21/14 1602 10/22/14 0550 10/23/14 0231  NA  --  141 139  K  --  2.9* 4.0  CL  --  105 107  CO2  --  27 25  GLUCOSE  --  126* 97  BUN  --  12 16  CREATININE  --  0.85 1.07*  CALCIUM  --  9.0 9.0  MG 1.7  --   --    CBC:  Recent Labs  10/22/14 0550 10/23/14 0231  WBC 12.5* 10.5  HGB 13.3 12.7  HCT 40.0 38.7  MCV 78.9 80.0  PLT 142* 144*   Cardiac Enzymes:  Recent Labs  10/21/14 1602 10/21/14 2124 10/22/14 0550  TROPONINI 0.08* 0.10* 0.11*    Imaging: Dg Chest 2 View  10/21/2014   CLINICAL DATA:  Slight cough, nausea, dizziness and shortness of breath a started yesterday. No fever.  EXAM: CHEST  2 VIEW  COMPARISON:  Chest x-rays dated 08/22/2014 and 10/10/2012. Chest CT dated 08/25/2014.  FINDINGS: Mild cardiomegaly is unchanged.  Overall cardiomediastinal silhouette is stable in size and configuration. There is mild central pulmonary vascular congestion and mild bilateral interstitial edema, basilar predominant, suggesting congestive heart failure. There are small bilateral pleural effusions which are new, with probable adjacent atelectasis.  Mild scarring/fibrosis is seen at each lung apex. The spiculated nodular density identified at the right lung apex on the previous chest CT, measuring 5 mm, is not able to be visualized on this chest x-ray.  IMPRESSION: 1. Cardiomegaly with central pulmonary vascular congestion and bilateral interstitial edema suggesting congestive heart failure/volume overload. 2. Small bilateral pleural effusions, with probable adjacent atelectasis. 3. The 5 mm right apical nodule described on previous chest CT of 08/25/2014 is not able to be visualized on this chest x-ray. Please note that a follow-up chest CT in 6-12 months was recommended on that previous report.   Electronically Signed   By: Franki Cabot M.D.   On: 10/21/2014 12:05    Cardiac Studies:  ECG:  SR PAC lateral infarct no acute changes    Telemetry:  NSR no arrhythmia  10/23/2014  Echo:  Reviewed 08/25/14 Study Conclusions  - Left ventricle: The cavity size was normal. There was mild concentric hypertrophy. Systolic function was normal. The estimated ejection fraction was in the range of 60% to 65%. Wall motion was normal; there were no regional wall motion abnormalities. Features are consistent with a pseudonormal left ventricular filling pattern, with concomitant abnormal relaxation and increased filling pressure (grade 2 diastolic dysfunction). - Aortic valve: There was mild to moderate regurgitation directed centrally in the LVOT. - Mitral valve: Mild thickening, involving the leaflet margin more than the base, consistent with rheumatic disease. There was moderate to severe regurgitation directed centrally.  Valve area by pressure half-time: 2.42 cm^2. - Left atrium: The atrium was moderately to severely dilated. - Pulmonary arteries: PA peak pressure: 32 mm Hg (S).   Medications:   . amLODipine  10 mg Oral Daily  . aspirin EC  81 mg Oral Daily  . atorvastatin  80 mg Oral Daily  . buPROPion  150 mg Oral Daily  . carvedilol  25 mg Oral BID WC  . clopidogrel  75 mg Oral Daily  . furosemide  40 mg Intravenous [id number]  . potassium chloride  40 mEq Oral TID  . sodium chloride  3 mL Intravenous Q12H     . nitroGLYCERIN Stopped (10/22/14 1200)    Assessment/Plan:  CHF:  See note by Dr Aundra Dubin ? Due to valvular disease and severe MR  Orders written and Dr Acie Fredrickson should do with 3D imaging  Continue bid Iv lasix K repleted .  Repeat CXR today with lateral decubitus  CAD:  Recent cath stable mild elevation in troponin likely from CHF cath 08/24/14 70% distal LAD stent restenosis medical Rx on plavix   TEE orders written  Jenkins Rouge 10/23/2014, 8:24 AM

## 2014-10-24 NOTE — CV Procedure (Signed)
    Transesophageal Echocardiogram Note  Leah [name] [id number] Apr 04, 1950  Procedure: Transesophageal Echocardiogram Indications: mitral regurgitation   Procedure Details Consent: Obtained Time Out: Verified patient identification, verified procedure, site/side was marked, verified correct patient position, special equipment/implants available, Radiology Safety Procedures followed,  medications/allergies/relevent history reviewed, required imaging and test results available.  Performed  Medications: Fentanyl: 50 mcg iv  Versed:  6 mg iv   Left Ventrical:  Normal LV function   Mitral Valve:  Severe MR.  Thickened MV ,  , MV leaflets open well.  No MS. Doubt rheumatic valve   Aortic Valve: moderate AI   Tricuspid Valve: normal   Pulmonic Valve: trivial PI  Left Atrium/ Left atrial appendage: large, no thrombi   Atrial septum:  No PFO by color doppler   Aorta: moderate atheroma ,    Complications: No apparent complications Patient did tolerate procedure well.   Thayer Headings, Brooke Bonito., MD, [facility] 10/24/2014, 10:50 AM

## 2014-10-24 NOTE — Progress Notes (Signed)
  Echocardiogram Echocardiogram Transesophageal has been performed.  Donata Clay 10/24/2014, 11:17 AM

## 2014-10-24 NOTE — Plan of Care (Signed)
Problem: Phase I Progression Outcomes Goal: EF % per last Echo/documented,Core Reminder form on chart Outcome: Completed/Met Date Met:  10/24/14 EF 60-65%(08-25-14)

## 2014-10-24 NOTE — Progress Notes (Signed)
SUBJECTIVE: Breathing is better. No chest pain. "Throat is sore" after TEE.   BP 165/57 mmHg  Pulse 63  Temp(Src) 98.7 F (37.1 C) (Oral)  Resp 18  Ht '6\' 1"'$  (1.854 m)  Wt 174 lb 3.2 oz (79.017 kg)  BMI 22.99 kg/m2  SpO2 95%  Intake/Output Summary (Last 24 hours) at 10/24/14 1147 Last data filed at 10/24/14 1118  Gross per 24 hour  Intake    520 ml  Output   1875 ml  Net  -1355 ml    PHYSICAL EXAM General: Well developed, well nourished, in no acute distress. Alert and oriented x 3.  Psych:  Good affect, responds appropriately Neck: No JVD. No masses noted.  Lungs: Clear bilaterally with no wheezes or rhonci noted.  Heart: RRR with systolic murmur noted.  Abdomen: Bowel sounds are present. Soft, non-tender.  Extremities: No lower extremity edema.   LABS: Basic Metabolic Panel:  Recent Labs  10/21/14 1602  10/23/14 0231 10/24/14 0323  NA  --   < > 139 139  K  --   < > 4.0 4.1  CL  --   < > 107 105  CO2  --   < > 25 27  GLUCOSE  --   < > 97 95  BUN  --   < > 16 13  CREATININE  --   < > 1.07* 1.12*  CALCIUM  --   < > 9.0 9.7  MG 1.7  --   --   --   < > = values in this interval not displayed. CBC:  Recent Labs  10/23/14 0231 10/24/14 0323  WBC 10.5 10.5  HGB 12.7 13.5  HCT 38.7 40.5  MCV 80.0 78.8  PLT 144* 149*   Cardiac Enzymes:  Recent Labs  10/21/14 1602 10/21/14 2124 10/22/14 0550  TROPONINI 0.08* 0.10* 0.11*   Current Meds: . amLODipine  10 mg Oral Daily  . aspirin EC  81 mg Oral Daily  . atorvastatin  80 mg Oral Daily  . buPROPion  150 mg Oral Daily  . carvedilol  25 mg Oral BID WC  . clopidogrel  75 mg Oral Daily  . furosemide  40 mg Intravenous [id number]  . potassium chloride  40 mEq Oral TID  . sodium chloride  3 mL Intravenous Q12H   Echo 08/25/14: Left ventricle: The cavity size was normal. There was mild concentric hypertrophy. Systolic function was normal. The estimated ejection fraction was in the range of 60% to  65%. Wall motion was normal; there were no regional wall motion abnormalities. Features are consistent with a pseudonormal left ventricular filling pattern, with concomitant abnormal relaxation and increased filling pressure (grade 2 diastolic dysfunction). - Aortic valve: There was mild to moderate regurgitation directed centrally in the LVOT. - Mitral valve: Mild thickening, involving the leaflet margin more than the base, consistent with rheumatic disease. There was moderate to severe regurgitation directed centrally. Valve area by pressure half-time: 2.42 cm^2. - Left atrium: The atrium was moderately to severely dilated. - Pulmonary arteries: PA peak pressure: 32 mm Hg (S).   TEE 10/24/14: Left Ventrical: Normal LV function  Mitral Valve: Severe MR. Thickened MV , , MV leaflets open well. No MS. Doubt rheumatic valve  Aortic Valve: moderate AI  Tricuspid Valve: normal  Pulmonic Valve: trivial PI Left Atrium/ Left atrial appendage: large, no thrombi  Atrial septum: No PFO by color doppler  Aorta: moderate atheroma   ASSESSMENT AND PLAN:  1. Acute on chronic diastolic CHF: She is diuresing well on IV Lasix. LV systolic function is normal by TEE today but pt with severe MR. Volume status is ok today and appears to be back at baseline. Will change to po Lasix today.  2. Severe mitral regurgitation: Will ask CT surgery to see today to discuss mitral valve replacement.   3. CAD: Last cath 08/25/14 per Dr. Fletcher Anon with stable CAD. Distal LAD with 70% stenosis but not felt to be flow limiting.   4. Elevated troponin: in setting of CHF, subtle elevation. Likely demand ischemia. Continue medical therapy. If CT surgery moves toward mitral valve procedure, will need repeat right and left heart cath.     Leah [name]  8/1/201611:47 AM

## 2014-10-25 ENCOUNTER — Inpatient Hospital Stay (HOSPITAL_COMMUNITY): Payer: Medicaid Other

## 2014-10-25 ENCOUNTER — Ambulatory Visit (HOSPITAL_COMMUNITY): Payer: Medicaid Other

## 2014-10-25 ENCOUNTER — Encounter (HOSPITAL_COMMUNITY): Payer: Self-pay | Admitting: Cardiovascular Disease

## 2014-10-25 DIAGNOSIS — I340 Nonrheumatic mitral (valve) insufficiency: Secondary | ICD-10-CM

## 2014-10-25 DIAGNOSIS — I739 Peripheral vascular disease, unspecified: Secondary | ICD-10-CM

## 2014-10-25 MED ORDER — FUROSEMIDE 40 MG PO TABS
40.0000 mg | ORAL_TABLET | Freq: Two times a day (BID) | ORAL | Status: DC
  Administered 2014-10-26: 40 mg via ORAL
  Filled 2014-10-25 (×3): qty 1

## 2014-10-25 MED ORDER — POTASSIUM CHLORIDE CRYS ER 20 MEQ PO TBCR
20.0000 meq | EXTENDED_RELEASE_TABLET | Freq: Two times a day (BID) | ORAL | Status: DC
  Administered 2014-10-25 – 2014-10-28 (×7): 20 meq via ORAL
  Filled 2014-10-25 (×8): qty 1

## 2014-10-25 NOTE — Progress Notes (Signed)
Heart Failure Navigator Consult Note  Presentation: Leah [name] is a 64 y/o AA female with a history of CAD, S/P PCI/ stenting of the mid LAD in June of 2009 with a 2.5 x 12 mm Promus stent. She has PVD as well and is status post left carotid endarterectomy in August of 2009, right external iliac stenting and bilateral renal artery stenting in 2009, and left femoropopliteal bypass grafting by Dr. Kellie Simmering in 2012. In August 2013 she had some chest pain. A Myoview study showed mild apical ischemia. Pt presented to 08/22/14 to the ER with chest pain. Cath done 08/24/14 revealed distal stent restenosis of 70% that was not felt to be hemodynamically significant and the plan is for medical Rx. She was seen in f/u 09/07/14 and was doing well. An echo done during that admission showed an EF of 60-65% with moderate to severe MR and mild to moderate AR and grade 2 diastolic dysfunction.  She has been dyspneic and is undergoing workup by CT surgery.   Past Medical History  Diagnosis Date  . Hyperlipidemia   . Hypertension   . GERD (gastroesophageal reflux disease)   . Leg pain   . Headache(784.0)   . Carotid artery occlusion   . COPD (chronic obstructive pulmonary disease)   . CAD (coronary artery disease)   . Peripheral vascular disease   . Tobacco abuse   . Shortness of breath dyspnea     History   Social History  . Marital Status: Divorced    Spouse Name: N/A  . Number of Children: 4  . Years of Education: N/A   Occupational History  .  Lab [employer]   Social History Main Topics  . Smoking status: Current Every Day Smoker -- 0.50 packs/day for 35 years    Types: Cigarettes  . Smokeless tobacco: Never Used     Comment: has not started wellbutrin yet  . Alcohol Use: No  . Drug Use: No  . Sexual Activity: No   Other Topics Concern  . None   Social History Narrative    ECHO:Study Conclusions  - Left ventricle: The cavity size was normal. There was mild concentric hypertrophy.  Systolic function was normal. The estimated ejection fraction was in the range of 60% to 65%. Wall motion was normal; there were no regional wall motion abnormalities. Features are consistent with a pseudonormal left ventricular filling pattern, with concomitant abnormal relaxation and increased filling pressure (grade 2 diastolic dysfunction). - Aortic valve: There was mild to moderate regurgitation directed centrally in the LVOT. - Mitral valve: Mild thickening, involving the leaflet margin more than the base, consistent with rheumatic disease. There was moderate to severe regurgitation directed centrally. Valve area by pressure half-time: 2.42 cm^2. - Left atrium: The atrium was moderately to severely dilated. - Pulmonary arteries: PA peak pressure: 32 mm Hg (S).  Transthoracic echocardiography. M-mode, complete 2D, spectral Doppler, and color Doppler. Birthdate: Patient birthdate: 28-Jul-1950. Age: Patient is 64 yr old. Sex: Gender: female. BMI: 22.5 kg/m^2. Blood pressure:   140/78 Patient status: Inpatient. Study date: Study date: 08/25/2014. Study time: 02:48 PM. Location: Bedside.  BNP    Component Value Date/Time   BNP 1223.3* 10/21/2014 1145    ProBNP No results found for: PROBNP   Education Assessment and Provision:  Detailed education and instructions provided on heart failure disease management including the following:  Signs and symptoms of Heart Failure When to call the physician Importance of daily weights Low sodium diet Fluid restriction Medication  management Anticipated future follow-up appointments  Patient education given on each of the above topics.  Patient acknowledges understanding and acceptance of all instructions.  I spoke briefly with Leah [name] regarding her HF.  She says that she is "waiting on the surgeon" and a decision about whether she will proceed with valve surgery.  She says she has a scale and has  not always weighed daily --yet can do so with no problem.  I reinforced the importance of daily weights and how they relate to her signs and symptoms of HF.  We discussed a low sodium diet and high sodium foods to avoid.  She admits that she occasionally has to "skip medications due to not being able to afford" refills.  I encouraged her to avoid skipping medications and take medications as prescribed.   I will plan to return to reinforce education at another time once decision is made regarding her valve.  Education Materials:  "Living Better With Heart Failure" Booklet, Daily Weight Tracker Tool    High Risk Criteria for Readmission and/or Poor Patient Outcomes:   EF <30%- No 60-65% with grade 2 dias dys.  2 or more admissions in 6 months- Yes  Difficult social situation- No--no insurance noted  Demonstrates medication noncompliance- Yes -tells me that she is forced to skip when she cannot afford    Barriers of Care:  Knowledge,compliance and finances  Discharge Planning:   Plans to discharge to home

## 2014-10-25 NOTE — Consult Note (Signed)
[address]       [place] [address]             [phone]        Caroly F Bucklew [facility] Medical Record #[id number] Date of Birth: Jan 16, 1951  Referring: Dr. Angelena Form Primary Care: Kathrine Cords, MD  Chief Complaint:    Chief Complaint  Patient presents with  . Shortness of Breath    History of Present Illness:     This is a 64 y/o [place] American female with a history of CAD, (s/p PCI with stenting of the mid LAD in June of 2009 using a 2.5 x 12 mm Promus stent). She has a history of peripheral vascular disease. She has had a left carotid endarterectomy in August of 2009, right external iliac stenting and bilateral renal artery stenting in 2009, and left femoropopliteal bypass grafting by Dr. Kellie Simmering in 2012. In August 2013, she had chest pain. A Myoview study showed mild apical ischemia. According to medical records, she presented to the ER on 08/22/14 with chest pain. Cardiac cath done 08/24/14 revealed distal stent restenosis of 70% that was not felt to be hemodynamically significant and the plan is for medical treatment. She was seen then seen in follow up 09/07/14 and was doing well. An echo done during then showed an EF of 60-65%, with moderate to severe MR, and mild to moderate AR, and grade 2 diastolic dysfunction.  She was seen at her medical doctor a week ago, but did not mention her shortness of breath. She states she had "gas issues" and was more focused on that. She presented to ER on 10/21/2014 with complaints of mild heaviness in her chest and shortness of breath on 10/21/2014. Her BNP was 1223 and Troponin I was initially 0.08 and slightly increased to 0.11. CXR showed mild cardiomegaly, central pulmonary vascular congestion and mild bilateral interstitial edema, small bilateral pleural effusions, and a spiculated nodular density on the right (about 5 mm) seen on previous CT was not seen on CXR. TEE done on 10/24/2014 shoed LVEF to 55-60%,  moderate AI,  severe MR, moderately calcified aorta, and no evidence of thrombus in LA. She is on Plavix since 2009 for coronary stent.  .Currently, she has no chest pain and her shortness of breath has improved with diuresis. He only other complaint is orthopnea. Her vital signs show she is afebrile, HR in the 60's, BP 139/68, and oxygen saturation 98% on room air. With medical treatment since admission she feels much better.  Patient denies known rheumatic fever as child, but tee suggests rheumatic valve disease  Patient is a current smoker up until this admission. Her smoking history and family history of her mother dying of lung cancer makes the right upper lung nodule a high risk finding although small lesion.  PFT's still pending   Current Activity/ Functional Status: Patient is independent with mobility/ambulation, transfers, ADL's, IADL's.   Zubrod Score: At the time of surgery this patient's most appropriate activity status/level should be described as: '[]'$     0    Normal activity, no symptoms '[x]'$     1    Restricted in physical strenuous activity but ambulatory, able to do out light work '[]'$     2    Ambulatory and capable of self care, unable to do work activities, up and about                 more than 50%  Of  the time                            '[]'$     3    Only limited self care, in bed greater than 50% of waking hours '[]'$     4    Completely disabled, no self care, confined to bed or chair '[]'$     5    Moribund  Past Medical History  Diagnosis Date  . Hyperlipidemia   . Hypertension   . GERD (gastroesophageal reflux disease)   . Leg pain   . Headache(784.0)   . Carotid artery occlusion   . COPD (chronic obstructive pulmonary disease)   . CAD (coronary artery disease)   . Peripheral vascular disease   . Tobacco abuse   . Shortness of breath dyspnea   . Renal vascular disease 10/26/2014    bilateral stents placed     Past Surgical History  Procedure Laterality Date  . Carotid  endarterectomy  11/21/2007    left  . Angioplasty / stenting iliac  2010    right external iliac by Dr. Irish Lack  . Coronary angioplasty with stent placement  6/09    LAD 2.5x12 Promus  . Femoral-popliteal bypass graft  10/12    left Dr. Kellie Simmering  . Abdominal hysterectomy    . Cardiac catheterization  11/13/11    Left Heart Cath. with Coronary Angiogram  . Cardiac catheterization N/A 08/24/2014    Procedure: Left Heart Cath and Coronary Angiography;  Surgeon: Wellington Hampshire, MD;  Location: [facility];  Service: Cardiovascular;  Laterality: N/A;  . Renal artery stent Bilateral   . Tee without cardioversion N/A 10/24/2014    Procedure: TRANSESOPHAGEAL ECHOCARDIOGRAM (TEE);  Surgeon: Thayer Headings, MD;  Location: [facility] END[facility];  Service: Cardiovascular;  Laterality: N/A;    History  Smoking status  . Current Every Day Smoker -- 0.50 packs/day for 35 years  . Types: Cigarettes  Smokeless tobacco  . Never Used    Comment: has not started wellbutrin yet    History  Alcohol Use No    History   Social History  . Marital Status: Divorced    Spouse Name: N/A  . Number of Children: 4  . Years of Education: N/A   Occupational History  .  [employer] previously, now unable to work   Social History Main Topics  . Smoking status: Current Every Day Smoker -- 0.50 packs/day for 35 years    Types: Cigarettes  . Smokeless tobacco: Never Used     Comment: has not started wellbutrin yet  . Alcohol Use: No  . Drug Use: No  . Sexual Activity: No     Allergies  Allergen Reactions  . Lisinopril Swelling    Angioedema 06/10/11  . Aspirin Other (See Comments)    Upset stomach  . Chantix [Varenicline]     insomnia  . Penicillins Hives    Current Facility-Administered Medications  Medication Dose Route Frequency Provider Last Rate Last Dose  . 0.9 %  sodium chloride infusion  250 mL Intravenous PRN Erlene Quan, PA-C   Stopped at 10/22/14 1200  . acetaminophen (TYLENOL) tablet  650 mg  650 mg Oral Q4H PRN Erlene Quan, PA-C   650 mg at 10/26/14 2355  . ALPRAZolam Duanne Moron) tablet 0.25 mg  0.25 mg Oral BID PRN Erlene Quan, PA-C   0.25 mg at 10/26/14 1109  . amLODipine (NORVASC) tablet 10  mg  10 mg Oral Daily Erlene Quan, PA-C   10 mg at 10/26/14 1110  . aspirin EC tablet 81 mg  81 mg Oral Daily Erlene Quan, PA-C   81 mg at 10/26/14 1110  . atorvastatin (LIPITOR) tablet 80 mg  80 mg Oral Daily Erlene Quan, PA-C   80 mg at 10/26/14 1110  . buPROPion (WELLBUTRIN XL) 24 hr tablet 150 mg  150 mg Oral Daily Erlene Quan, PA-C   150 mg at 10/26/14 1110  . carvedilol (COREG) tablet 25 mg  25 mg Oral BID WC Doreene Burke Kilroy, PA-C   25 mg at 10/26/14 9326  . clopidogrel (PLAVIX) tablet 75 mg  75 mg Oral Daily Erlene Quan, PA-C   75 mg at 10/26/14 1110  . cyclobenzaprine (FLEXERIL) tablet 7.5 mg  7.5 mg Oral TID PRN Erlene Quan, PA-C      . furosemide (LASIX) tablet 40 mg  40 mg Oral BID Almyra Deforest, PA   40 mg at 10/26/14 0807  . HYDROcodone-acetaminophen (NORCO/VICODIN) 5-325 MG per tablet 1 tablet  1 tablet Oral Q6H PRN Doreene Burke Kilroy, PA-C      . magnesium hydroxide (MILK OF MAGNESIA) suspension 30 mL  30 mL Oral Daily PRN Larey Dresser, MD   30 mL at 10/21/14 2209  . nitroGLYCERIN (NITROSTAT) SL tablet 0.4 mg  0.4 mg Sublingual Q5 Min x 3 PRN Doreene Burke Kilroy, PA-C      . nitroGLYCERIN 50 mg in dextrose 5 % 250 mL (0.2 mg/mL) infusion  5-20 mcg/min Intravenous Titrated Larey Dresser, MD   Stopped at 10/22/14 1200  . ondansetron (ZOFRAN) injection 4 mg  4 mg Intravenous Q6H PRN Erlene Quan, PA-C   4 mg at 10/22/14 0358  . phenol (CHLORASEPTIC) mouth spray 1 spray  1 spray Mouth/Throat PRN Larey Dresser, MD   1 spray at 10/24/14 2050  . potassium chloride SA (K-DUR,KLOR-CON) CR tablet 20 mEq  20 mEq Oral BID Almyra Deforest, PA   20 mEq at 10/26/14 1110  . sodium chloride 0.9 % injection 3 mL  3 mL Intravenous Q12H Luke K Kilroy, PA-C   3 mL at 10/26/14 1000  . sodium chloride  0.9 % injection 3 mL  3 mL Intravenous PRN Erlene Quan, PA-C      . traMADol Veatrice Bourbon) tablet 50 mg  50 mg Oral Q6H PRN Erlene Quan, PA-C   50 mg at 10/22/14 0115  . traZODone (DESYREL) tablet 100 mg  100 mg Oral QHS PRN Erlene Quan, PA-C   100 mg at 10/25/14 2215  . zolpidem (AMBIEN) tablet 5 mg  5 mg Oral QHS PRN Erlene Quan, PA-C        Prescriptions prior to admission  Medication Sig Dispense Refill Last Dose  . acetaminophen (TYLENOL) 325 MG tablet Take 2 tablets (650 mg total) by mouth every 4 (four) hours as needed for fever, headache or mild pain.   unkn  . amLODipine (NORVASC) 10 MG tablet Take 1 tablet (10 mg total) by mouth daily. 30 tablet 2 10/21/2014 at Unknown time  . aspirin EC 81 MG tablet Take 1 tablet (81 mg total) by mouth daily.   10/21/2014 at Unknown time  . atorvastatin (LIPITOR) 80 MG tablet Take 1 tablet (80 mg total) by mouth daily. 30 tablet 2 10/20/2014 at Unknown time  . buPROPion (WELLBUTRIN XL) 150 MG 24 hr tablet Take 1 tablet (150  mg total) by mouth daily. For 3 days, then increase '300mg'$  (2 tablets) daily 60 tablet 0 10/20/2014 at Unknown time  . carvedilol (COREG) 25 MG tablet Take 1 tablet (25 mg total) by mouth 2 (two) times daily with a meal. ONE TABLET IN AM ONE TABLET IN PM 60 tablet 6 10/19/2014 at 0800  . clopidogrel (PLAVIX) 75 MG tablet Take 1 tablet (75 mg total) by mouth daily. 30 tablet 2 10/21/2014 at Unknown time  . cyclobenzaprine (FLEXERIL) 10 MG tablet TAKE 1 TABLET BY MOUTH 3 TIMES DAILY AS NEEDED FOR MUSCLE SPASMS 70 tablet 0 10/20/2014 at Unknown time  . HYDROcodone-acetaminophen (NORCO/VICODIN) 5-325 MG per tablet Take 1 tablet by mouth every 6 (six) hours as needed (breakthrough pain). 15 tablet 0 10/20/2014 at Unknown time  . traMADol (ULTRAM) 50 MG tablet Take 1 tablet (50 mg total) by mouth every 8 (eight) hours as needed. 60 tablet 0 unkn  . traZODone (DESYREL) 100 MG tablet Take 1 tablet (100 mg total) by mouth at bedtime as needed for  sleep. 30 tablet 2 10/20/2014 at Unknown time    Family History  Problem Relation Age of Onset  . Cancer Mother     BRAIN AND LUNG  . Hypertension Father   . Heart disease Father      Review of Systems:     Cardiac Review of Systems: Y or N  Chest Pain [  N  ]  Resting SOB [  N ] Exertional SOB  [ Y ]  Orthopnea [ Y ]   Pedal Edema [  N ]    Palpitations [ N ] Syncope  Aqua.Slicker  ]   Presyncope [  N ]  General Review of Systems: [Y] = yes [  ]=no Constitional: fatigue [ Y ]; nausea [ N ]; night sweats Aqua.Slicker  ]; fever Aqua.Slicker  ]; or chills [ N ]                                                             Dental: no teeth, has dentures  Eye : blurred vision [ N ]; diplopia [ N  ]; vision changes [ N ];  Amaurosis fugax[ N ]; Resp: cough [ N ];  wheezing[ N ];  hemoptysis[ N ];  GI:  gallstones[ N ], vomiting[  N];  dysphagia[ N ]; melena[ N ];  hematochezia [ N ]; heartburn[ N ]; GU: kidney stones N[ N ]; hematuria[  N];   dysuria [ N ];  nocturia[ N ];  urinary frequency [ N ]             Skin: rash, swelling[ N ];, hair loss[ N ];  peripheral edema[ N ];  or itching[N  ]; Musculosketetal: myalgias[ N ];  joint swelling[ N ];  joint erythema[ N ];  Heme/Lymph: bruising[ N ];  bleeding[ N ];  anemia[ N ];  Neuro: TIA[ N ];  headaches[ Y ];  stroke[N  ];  vertigo[ N ];  seizures[ N ];   paresthesias[N  ];  difficulty walking[  N];  Psych:depression[N  ]; anxiety[  N];  Endocrine: diabetes[N  ];  thyroid dysfunction[ N ];    Physical Exam: BP 140/65 mmHg  Pulse 58  Temp(Src) 98 F (36.7 C) (  Oral)  Resp 18  Ht '6\' 1"'$  (1.854 m)  Wt 170 lb 9.6 oz (77.384 kg)  BMI 22.51 kg/m2  SpO2 99%   General appearance: alert, cooperative and no distress Head: Normocephalic, without obvious abnormality, atraumatic Neck: no adenopathy, no carotid bruit, no JVD, supple, symmetrical, trachea midline and well healed scar from previous left carotid endarterectomy Resp: clear to auscultation bilaterally Cardio:  RRR, systolic murmur grade II/VI GI: soft, non-tender; bowel sounds normal; no masses,  no organomegaly Extremities: No lower extremity edema. Well healed scars left thigh and lower leg from previous vascular surgery Neurologic: Grossly normal  Diagnostic Studies & Laboratory data:   EXAM: CHEST 2 VIEW  COMPARISON: Chest x-rays dated 08/22/2014 and 10/10/2012. Chest CT dated 08/25/2014.  FINDINGS: Mild cardiomegaly is unchanged. Overall cardiomediastinal silhouette is stable in size and configuration. There is mild central pulmonary vascular congestion and mild bilateral interstitial edema, basilar predominant, suggesting congestive heart failure. There are small bilateral pleural effusions which are new, with probable adjacent atelectasis.  Mild scarring/fibrosis is seen at each lung apex. The spiculated nodular density identified at the right lung apex on the previous chest CT, measuring 5 mm, is not able to be visualized on this chest x-ray.  IMPRESSION: 1. Cardiomegaly with central pulmonary vascular congestion and bilateral interstitial edema suggesting congestive heart failure/volume overload. 2. Small bilateral pleural effusions, with probable adjacent atelectasis. 3. The 5 mm right apical nodule described on previous chest CT of 08/25/2014 is not able to be visualized on this chest x-ray. Please note that a follow-up chest CT in 6-12 months was recommended on that previous report.   Electronically Signed  By: Franki Cabot M.D.  On: 10/21/2014 12:05  Recent Radiology Findings:   CLINICAL DATA: Chest pain, shortness of breath, abnormal chest radiograph  EXAM: CT CHEST WITH CONTRAST  TECHNIQUE: Multidetector CT imaging of the chest was performed during intravenous contrast administration.  CONTRAST: 48m OMNIPAQUE IOHEXOL 300 MG/ML SOLN  COMPARISON: Chest radiographs dated 08/22/2014. CTA chest  dated 08/29/2007.a  FINDINGS: Mediastinum/Nodes: The heart is normal in size. No pericardial effusion.  Coronary atherosclerosis.  Atherosclerotic calcifications of the aortic arch. Eccentric mural thrombus in the descending thoracic aorta.  No suspicious mediastinal, hilar, or axillary lymphadenopathy.  Visualized thyroid is unremarkable.  Lungs/Pleura: 6 x 5 x 6 mm irregular nodular opacity in the anterior right upper lobe (series 205/image 12), new from 2009.  Mild linear scarring/atelectasis in the left lower lobe (series 205/image 47).  No focal consolidation.  Mild centrilobular and paraseptal emphysematous changes.  No pleural effusion or pneumothorax.  Upper abdomen: Visualized upper abdomen is notable for vascular calcifications and bilateral renal stents.  Musculoskeletal: Mild degenerative changes of the visualized thoracolumbar spine.  IMPRESSION: 6 mm irregular nodular opacity in the anterior right upper lobe, possibly mildly spiculated, new from 2009.  Given that patient is high risk for primary bronchogenic neoplasm, initial follow-up CT chest is suggested in 6-12 months.  This recommendation follows the consensus statement: Guidelines for Management of Small Pulmonary Nodules Detected on CT Scans: A Statement from the [employer] published in Radiology 2005; 237:395-400.   Electronically Signed  By: SJulian HyM.D.  On: 08/25/2014 16:08   I have independently reviewed the above radiologic studies.  Recent Lab Findings: Lab Results  Component Value Date   WBC 10.5 10/24/2014   HGB 13.5 10/24/2014   HCT 40.5 10/24/2014   PLT 149* 10/24/2014   GLUCOSE 95 10/24/2014   CHOL 181 08/24/2014  TRIG 88 08/24/2014   HDL 33* 08/24/2014   LDLDIRECT 185* 09/09/2011   LDLCALC 130* 08/24/2014   ALT 9 02/28/2014   AST 14 02/28/2014   NA 139 10/24/2014   K 4.1 10/24/2014   CL 105 10/24/2014   CREATININE 1.12*  10/24/2014   BUN 13 10/24/2014   CO2 27 10/24/2014   TSH 0.378 02/28/2014   INR 1.19 10/21/2014   HGBA1C 5.7 02/28/2014   Assessment / Plan:   1. Severe MR and moderate AI. Likely needs MVR and AVR CABG likely to be rheumatic  2. Acute on chronic  diastolic CHF-on Lasix 40 mg bid. Diuresis per cardiology.  3. S/p PCI with stent to LAD. Cath 2 months ago showed distal edge stent restenosis in the LAD. Because of its location and tortuosity, medical therapy had been  Recommended. Still on Plavix, dose today 11:00 am 4. Extensive history of peripheral vascular disease, cerebral vascular disease and renal vascular disease 5. History of COPD no PFTs done to evaluate  and is active smoker 6. New right upper lobe lung nodule ,6 mm irregular nodular opacity in the anterior right upper lobe, possibly mildly spiculated, new from 2009 seen on CT this past June 2016 , but not visualized on recent CXR. Will need repeat CT. 7. History of essential hypertension-on Norvasc 10 mg daily, Corg 25 mg bid 8. Not currently in A Fib  Recommend, right heart cath, PFT's, repeat ct of the chest before surgery, aggressive smoking cessation including delaying any elective surgery until not smoking 4 weeks. Will need to be off plavix fr 5 days prior to surgery, got dose today   [name] MD      [address] [place] [address] Office [phone]   [place]

## 2014-10-25 NOTE — Progress Notes (Signed)
Patient Name: Leah [name] [place] Date of Encounter: 10/25/2014  Primary Cardiologist: Dr Martinique   Principal Problem:   Acute on chronic diastolic congestive heart failure Active Problems:   Hyperlipidemia   Essential hypertension, benign   CAD S/P LAD DES 2009   PVD- s/p multiple proceedures   History of angioedema with ACE 2013   Moderate to severe mitral regurgitation   Pulmonary nodule, right-needs repeat CT in Dec 2016   Smoker- 5 a day   Troponin level elevated   Diastolic dysfunction, grade 2 by echo June 2016    SUBJECTIVE  Denies any CP or SOB. No further SOB.   CURRENT MEDS . amLODipine  10 mg Oral Daily  . aspirin EC  81 mg Oral Daily  . atorvastatin  80 mg Oral Daily  . buPROPion  150 mg Oral Daily  . carvedilol  25 mg Oral BID WC  . clopidogrel  75 mg Oral Daily  . furosemide  40 mg Intravenous [id number]  . potassium chloride  40 mEq Oral TID  . sodium chloride  3 mL Intravenous Q12H    OBJECTIVE  Filed Vitals:   10/24/14 2100 10/25/14 0149 10/25/14 0531 10/25/14 0849  BP: 154/64 140/67 145/61 139/68  Pulse: 63 64 62 61  Temp: 98.3 F (36.8 C) 98.8 F (37.1 C) 98.4 F (36.9 C)   TempSrc: Oral Oral Oral   Resp: '20 18 18   '$ Height:      Weight:   171 lb 1.2 oz (77.6 kg)   SpO2: 100% 96% 98%     Intake/Output Summary (Last 24 hours) at 10/25/14 0942 Last data filed at 10/25/14 0941  Gross per 24 hour  Intake   1460 ml  Output   1700 ml  Net   -240 ml   Filed Weights   10/23/14 0329 10/24/14 0443 10/25/14 0531  Weight: 177 lb 14.6 oz (80.7 kg) 174 lb 3.2 oz (79.017 kg) 171 lb 1.2 oz (77.6 kg)    PHYSICAL EXAM  General: Pleasant, NAD. Neuro: Alert and oriented X 3. Moves all extremities spontaneously. Psych: Normal affect. HEENT:  Normal  Neck: Supple without bruits or JVD. Lungs:  Resp regular and unlabored, CTA. Heart: RRR no s3, s4, or murmurs. Abdomen: Soft, non-tender, non-distended, BS + x 4.  Extremities: No clubbing,  cyanosis or edema. DP/PT/Radials 2+ and equal bilaterally.  Accessory Clinical Findings  CBC  Recent Labs  10/23/14 0231 10/24/14 0323  WBC 10.5 10.5  HGB 12.7 13.5  HCT 38.7 40.5  MCV 80.0 78.8  PLT 144* 277*   Basic Metabolic Panel  Recent Labs  10/23/14 0231 10/24/14 0323  NA 139 139  K 4.0 4.1  CL 107 105  CO2 25 27  GLUCOSE 97 95  BUN 16 13  CREATININE 1.07* 1.12*  CALCIUM 9.0 9.7    TELE NSR    ECG  No new EKG  Echocardiogram 08/25/2014  - Left ventricle: The cavity size was normal. There was mild concentric hypertrophy. Systolic function was normal. The estimated ejection fraction was in the range of 60% to 65%. Wall motion was normal; there were no regional wall motion abnormalities. Features are consistent with a pseudonormal left ventricular filling pattern, with concomitant abnormal relaxation and increased filling pressure (grade 2 diastolic dysfunction). - Aortic valve: There was mild to moderate regurgitation directed centrally in the LVOT. - Mitral valve: Mild thickening, involving the leaflet margin more than the base, consistent with rheumatic disease. There was moderate to  severe regurgitation directed centrally. Valve area by pressure half-time: 2.42 cm^2. - Left atrium: The atrium was moderately to severely dilated. - Pulmonary arteries: PA peak pressure: 32 mm Hg (S).    Radiology/Studies  Dg Chest 2 View  10/21/2014   CLINICAL DATA:  Slight cough, nausea, dizziness and shortness of breath a started yesterday. No fever.  EXAM: CHEST  2 VIEW  COMPARISON:  Chest x-rays dated 08/22/2014 and 10/10/2012. Chest CT dated 08/25/2014.  FINDINGS: Mild cardiomegaly is unchanged. Overall cardiomediastinal silhouette is stable in size and configuration. There is mild central pulmonary vascular congestion and mild bilateral interstitial edema, basilar predominant, suggesting congestive heart failure. There are small bilateral  pleural effusions which are new, with probable adjacent atelectasis.  Mild scarring/fibrosis is seen at each lung apex. The spiculated nodular density identified at the right lung apex on the previous chest CT, measuring 5 mm, is not able to be visualized on this chest x-ray.  IMPRESSION: 1. Cardiomegaly with central pulmonary vascular congestion and bilateral interstitial edema suggesting congestive heart failure/volume overload. 2. Small bilateral pleural effusions, with probable adjacent atelectasis. 3. The 5 mm right apical nodule described on previous chest CT of 08/25/2014 is not able to be visualized on this chest x-ray. Please note that a follow-up chest CT in 6-12 months was recommended on that previous report.   Electronically Signed   By: Franki Cabot M.D.   On: 10/21/2014 12:05   Dg Chest Port 1 View  10/23/2014   CLINICAL DATA:  CHF  EXAM: PORTABLE CHEST - 1 VIEW  COMPARISON:  10/21/2014  FINDINGS: Hyperinflation of the lungs. Mild cardiomegaly. Right base airspace opacity slightly improved, likely residual atelectasis. Small right pleural effusion.  IMPRESSION: Improving right basilar aeration with continued right basilar atelectasis and small right effusion.   Electronically Signed   By: Rolm Baptise M.D.   On: 10/23/2014 09:13    ASSESSMENT AND PLAN  64 yo AA female with h/o CAD s/p PCI/stenting of mid LAD in 08/2007, PVD s/p L carotid endarterectomy in 10/2007, R ext iliac stent and bilateral renal artery stenting in 2009, L fempop bypass in 2012. Cath 08/24/2014 revealed distal stent restenosis of 70% that was not felt to be hemodynamically significant. Echo showed EF 60-65% with moderate to severe MR and mild to moderate AR and grade 2 diastolic dysfunction. She presented with dyspnea despite compliance with her meds. Undergoing workup by CT surgery.   1. Acute diastolic HF in the setting severe MR  - weight down from 183 to 171, I/O -4.8 L. Change to PO lasix '40mg'$  BID and KCL 32mq  BID  - instructed on avoid salty food, limit fluid intake to <2L per day, weigh herself every morning and call cardiology and potentially take additional dose of lasix if weight increase by more than 3 lbs overnight or 5 lbs in a single week.   2. Moderate to severe MR  - s/p TEE on 8/1, doubt rheumatic valve  - under evaluation by CT surgery for potential mitral valve replacement  3. Moderate AR  4. CAD: Recent cath with moderate (70%) ISR in LAD stent. Was not thought to be flow-limiting.  - will followup with surgery recommendation regarding cath  5. Elevated trop: in the setting CHF, likely demand ischemia  Signed, MWoodward KuPager: [phone] I have personally seen and examined this patient with HAlmyra Deforest PA-C. I agree with the assessment and plan as outlined above. Volume status is much better. Change  to po Lasix. She has severe MR. CT surgery consult pending. She may need R and L heart cath if surgery is planned.   Lynett Brasil 10/25/2014 10:56 AM

## 2014-10-25 NOTE — Progress Notes (Signed)
Pre-op Cardiac Surgery  Carotid Findings: Study was technically limited due to patient anatomy. Findings suggest 1-39% internal carotid artery stenosis bilaterally. Vertebral arteries are patent with antegrade flow.  Upper Extremity Right Left  Brachial Pressures 154-Triphasic 159-Triphasic  Radial Waveforms Triphasic Triphasic  Ulnar Waveforms Triphasic Triphasic  Palmar Arch (Allen's Test) Signal obliterates with radial compression, decreases >50% with ulnar compression. Signal obliterates with radial and ulnar compression.    Lower  Extremity Right Left  Dorsalis Pedis 30-Severely dampened monophasic Unable to insonate.  Anterior Tibial    Posterior Tibial 117-Monophsaic 106-Monophasic  Ankle/Brachial Indices 0.74 0.67    Findings:   Bilateral ABIs are suggestive of moderate arterial insufficiency at rest.   Lower Extremity Arterial Duplex has been completed.   There is evidence of elevated velocities suggestive of a 75-99% stenosis of the right profunda femoral artery, and 50-74% stenosis of the mid right femoral artery. There is no obvious evidence of restenosis of the left fem-pop bypass. There is no obvious evidence of hemodynamically significant stenosis of the left lower extremity. Unable to insonate the left dorsalis pedis artery.   10/25/2014 12:48 PM Maudry Mayhew, RVT, RDCS, RDMS

## 2014-10-25 NOTE — Progress Notes (Signed)
CARDIAC REHAB PHASE I   PRE:  Rate/Rhythm: 59SB  BP:  Supine:   Sitting: 141/74  Standing:    SaO2: 100%RA  MODE:  Ambulation: 460 ft   POST:  Rate/Rhythm: 70  BP:  Supine:   Sitting: 134/78  Standing:    SaO2: 98%RA 1310-1405 Pt walked 460 ft with steady gait. Legs tired by end of walk. Tolerated well. Reviewed CHF booklet with green,yellow and red zones. Pt knows to call MD with weight gain of 3 lbs overnight or 5 in week, 2000 mg sodium restriction and 2L fluid. Gave low sodium handouts. Discussed smoking cessation and pt stated she is on welbutrin. Offered pt fake cigarette but she stated did not need. Has smoking cessation handout. Will continue to follow.   Graylon Good, RN BSN  10/25/2014 2:00 PM

## 2014-10-26 ENCOUNTER — Encounter (HOSPITAL_COMMUNITY)
Admission: EM | Disposition: A | Discharge status: Home / Self Care | Payer: Self-pay | Source: Home / Self Care | Attending: Cardiology

## 2014-10-26 ENCOUNTER — Encounter (HOSPITAL_COMMUNITY): Payer: Self-pay

## 2014-10-26 ENCOUNTER — Encounter (HOSPITAL_COMMUNITY): Payer: Self-pay | Admitting: Cardiothoracic Surgery

## 2014-10-26 DIAGNOSIS — I351 Nonrheumatic aortic (valve) insufficiency: Secondary | ICD-10-CM | POA: Diagnosis present

## 2014-10-26 DIAGNOSIS — N2889 Other specified disorders of kidney and ureter: Secondary | ICD-10-CM

## 2014-10-26 HISTORY — DX: Other specified disorders of kidney and ureter: N28.89

## 2014-10-26 HISTORY — PX: CARDIAC CATHETERIZATION: SHX172

## 2014-10-26 LAB — BASIC METABOLIC PANEL
Anion gap: 13 (ref 5–15)
BUN: 22 mg/dL — ABNORMAL HIGH (ref 6–20)
CHLORIDE: 97 mmol/L — AB (ref 101–111)
CO2: 24 mmol/L (ref 22–32)
CREATININE: 1.27 mg/dL — AB (ref 0.44–1.00)
Calcium: 10 mg/dL (ref 8.9–10.3)
GFR calc Af Amer: 52 mL/min — ABNORMAL LOW (ref >60)
GFR calc non Af Amer: 45 mL/min — ABNORMAL LOW (ref >60)
Glucose, Bld: 101 mg/dL — ABNORMAL HIGH (ref 65–99)
Potassium: 4.7 mmol/L (ref 3.5–5.1)
Sodium: 134 mmol/L — ABNORMAL LOW (ref 135–145)

## 2014-10-26 LAB — POCT I-STAT 3, VENOUS BLOOD GAS (G3P V)
BICARBONATE: 23.9 meq/L (ref 20.0–24.0)
Bicarbonate: 25.3 mEq/L — ABNORMAL HIGH (ref 20.0–24.0)
O2 Saturation: 69 %
O2 Saturation: 94 %
PCO2 VEN: 41.3 mmHg — AB (ref 45.0–50.0)
PH VEN: 7.395 — AB (ref 7.250–7.300)
PO2 VEN: 36 mmHg (ref 30.0–45.0)
PO2 VEN: 66 mmHg — AB (ref 30.0–45.0)
TCO2: 27 mmol/L (ref 0–100)
TCO2: 25 mmol/L (ref 0–100)
pCO2, Ven: 35.1 mmHg — ABNORMAL LOW (ref 45.0–50.0)
pH, Ven: 7.441 — ABNORMAL HIGH (ref 7.250–7.300)

## 2014-10-26 LAB — CBC
HEMATOCRIT: 46 % (ref 36.0–46.0)
HEMOGLOBIN: 15 g/dL (ref 12.0–15.0)
MCH: 26 pg (ref 26.0–34.0)
MCHC: 32.6 g/dL (ref 30.0–36.0)
MCV: 79.9 fL (ref 78.0–100.0)
Platelets: 190 10*3/uL (ref 150–400)
RBC: 5.76 MIL/uL — AB (ref 3.87–5.11)
RDW: 15.4 % (ref 11.5–15.5)
WBC: 9.9 10*3/uL (ref 4.0–10.5)

## 2014-10-26 LAB — PROTIME-INR
INR: 1.09 (ref 0.00–1.49)
PROTHROMBIN TIME: 14.3 s (ref 11.6–15.2)

## 2014-10-26 SURGERY — RIGHT/LEFT HEART CATH AND CORONARY ANGIOGRAPHY

## 2014-10-26 MED ORDER — FENTANYL CITRATE (PF) 100 MCG/2ML IJ SOLN
INTRAMUSCULAR | Status: AC
  Filled 2014-10-26: qty 4

## 2014-10-26 MED ORDER — FUROSEMIDE 40 MG PO TABS
40.0000 mg | ORAL_TABLET | Freq: Every day | ORAL | Status: DC
  Administered 2014-10-27 – 2014-10-28 (×2): 40 mg via ORAL
  Filled 2014-10-26 (×2): qty 1

## 2014-10-26 MED ORDER — ASPIRIN 81 MG PO CHEW: 81.0000 mg | CHEWABLE_TABLET | ORAL | Status: DC

## 2014-10-26 MED ORDER — VERAPAMIL HCL 2.5 MG/ML IV SOLN
INTRAVENOUS | Status: AC
  Filled 2014-10-26: qty 2

## 2014-10-26 MED ORDER — SODIUM CHLORIDE 0.9 % IV SOLN: 250.0000 mL | INTRAVENOUS | Status: DC | PRN

## 2014-10-26 MED ORDER — IOHEXOL 350 MG/ML SOLN
INTRAVENOUS | Status: DC | PRN
  Administered 2014-10-26: 70 mL via INTRA_ARTERIAL

## 2014-10-26 MED ORDER — SODIUM CHLORIDE 0.9 % IV SOLN
INTRAVENOUS | Status: DC | PRN
  Administered 2014-10-26: 250 mL via INTRAVENOUS

## 2014-10-26 MED ORDER — SODIUM CHLORIDE 0.9 % IJ SOLN: 3.0000 mL | INTRAMUSCULAR | Status: DC | PRN

## 2014-10-26 MED ORDER — SODIUM CHLORIDE 0.9 % IJ SOLN
3.0000 mL | INTRAMUSCULAR | Status: DC | PRN
Start: 2014-10-26 — End: 2014-10-26

## 2014-10-26 MED ORDER — FENTANYL CITRATE (PF) 100 MCG/2ML IJ SOLN
INTRAMUSCULAR | Status: DC | PRN
  Administered 2014-10-26: 25 ug via INTRAVENOUS

## 2014-10-26 MED ORDER — HEPARIN SODIUM (PORCINE) 1000 UNIT/ML IJ SOLN
INTRAMUSCULAR | Status: DC | PRN
  Administered 2014-10-26: 4000 [IU] via INTRAVENOUS

## 2014-10-26 MED ORDER — VERAPAMIL HCL 2.5 MG/ML IV SOLN
INTRAVENOUS | Status: DC | PRN
  Administered 2014-10-26: 15:00:00 via INTRA_ARTERIAL

## 2014-10-26 MED ORDER — HEPARIN (PORCINE) IN NACL 2-0.9 UNIT/ML-% IJ SOLN
INTRAMUSCULAR | Status: AC
Start: 2014-10-26 — End: 2014-10-26
  Filled 2014-10-26: qty 1000

## 2014-10-26 MED ORDER — SODIUM CHLORIDE 0.9 % IJ SOLN
3.0000 mL | Freq: Two times a day (BID) | INTRAMUSCULAR | Status: DC
  Administered 2014-10-26: 3 mL via INTRAVENOUS

## 2014-10-26 MED ORDER — MIDAZOLAM HCL 2 MG/2ML IJ SOLN
INTRAMUSCULAR | Status: AC
  Filled 2014-10-26: qty 4

## 2014-10-26 MED ORDER — HEPARIN SODIUM (PORCINE) 5000 UNIT/ML IJ SOLN
5000.0000 [IU] | Freq: Three times a day (TID) | INTRAMUSCULAR | Status: DC
  Administered 2014-10-27 – 2014-10-28 (×5): 5000 [IU] via SUBCUTANEOUS
  Filled 2014-10-26 (×6): qty 1

## 2014-10-26 MED ORDER — SODIUM CHLORIDE 0.9 % IJ SOLN
3.0000 mL | Freq: Two times a day (BID) | INTRAMUSCULAR | Status: DC
  Administered 2014-10-27 – 2014-10-28 (×2): 3 mL via INTRAVENOUS

## 2014-10-26 MED ORDER — MIDAZOLAM HCL 2 MG/2ML IJ SOLN
INTRAMUSCULAR | Status: DC | PRN
  Administered 2014-10-26: 1 mg via INTRAVENOUS

## 2014-10-26 MED ORDER — HEPARIN SODIUM (PORCINE) 1000 UNIT/ML IJ SOLN
INTRAMUSCULAR | Status: AC
  Filled 2014-10-26: qty 1

## 2014-10-26 MED ORDER — NITROGLYCERIN 1 MG/10 ML FOR IR/CATH LAB
INTRA_ARTERIAL | Status: DC | PRN
  Administered 2014-10-26: 16:00:00

## 2014-10-26 MED ORDER — SODIUM CHLORIDE 0.9 % WEIGHT BASED INFUSION
1.0000 mL/kg/h | INTRAVENOUS | Status: AC
  Administered 2014-10-26: 1 mL/kg/h via INTRAVENOUS

## 2014-10-26 MED ORDER — SODIUM CHLORIDE 0.9 % IV SOLN
INTRAVENOUS | Status: DC
  Administered 2014-10-26: 13:00:00 via INTRAVENOUS

## 2014-10-26 MED ORDER — LIDOCAINE HCL (PF) 1 % IJ SOLN
INTRAMUSCULAR | Status: DC | PRN
  Administered 2014-10-26 (×2): 5 mL via INTRADERMAL

## 2014-10-26 MED ORDER — SODIUM CHLORIDE 0.9 % IV SOLN: INTRAVENOUS | Status: DC

## 2014-10-26 MED ORDER — NITROGLYCERIN 1 MG/10 ML FOR IR/CATH LAB
INTRA_ARTERIAL | Status: AC
  Filled 2014-10-26: qty 10

## 2014-10-26 MED ORDER — LIDOCAINE HCL (PF) 1 % IJ SOLN
INTRAMUSCULAR | Status: AC
  Filled 2014-10-26: qty 30

## 2014-10-26 SURGICAL SUPPLY — 14 items
CATH BALLN WEDGE 5F 110CM (CATHETERS) ×3 IMPLANT
CATH INFINITI 5 FR JL3.5 (CATHETERS) ×3 IMPLANT
CATH INFINITI 5FR ANG PIGTAIL (CATHETERS) ×3 IMPLANT
CATH INFINITI JR4 5F (CATHETERS) ×3 IMPLANT
DEVICE RAD COMP TR BAND LRG (VASCULAR PRODUCTS) ×3 IMPLANT
GLIDESHEATH SLEND SS 6F .021 (SHEATH) ×3 IMPLANT
KIT HEART LEFT (KITS) ×3 IMPLANT
KIT HEART RIGHT NAMIC (KITS) ×3 IMPLANT
PACK CARDIAC CATHETERIZATION (CUSTOM PROCEDURE TRAY) ×3 IMPLANT
SHEATH FAST CATH BRACH 5F 5CM (SHEATH) ×3 IMPLANT
SYR MEDRAD MARK V 150ML (SYRINGE) ×3 IMPLANT
TRANSDUCER W/STOPCOCK (MISCELLANEOUS) ×3 IMPLANT
TUBING CIL FLEX 10 FLL-RA (TUBING) ×3 IMPLANT
WIRE SAFE-T 1.5MM-J .035X260CM (WIRE) ×3 IMPLANT

## 2014-10-26 NOTE — Interval H&P Note (Signed)
History and Physical Interval Note:  10/26/2014 2:26 PM  Leah [name]  has presented today for surgery, with the diagnosis of CP  The various methods of treatment have been discussed with the patient and family. After consideration of risks, benefits and other options for treatment, the patient has consented to  Procedure(s): Right/Left Heart Cath and Coronary Angiography (N/A) as a surgical intervention .  The patient's history has been reviewed, patient examined, no change in status, stable for surgery.  I have reviewed the patient's chart and labs.  Questions were answered to the patient's satisfaction.   Cath Lab Visit (complete for each Cath Lab visit)  Clinical Evaluation Leading to the Procedure:   ACS: No.  Non-ACS:    Anginal Classification: CCS II  Anti-ischemic medical therapy: Minimal Therapy (1 class of medications)  Non-Invasive Test Results: No non-invasive testing performed  Prior CABG: No previous CABG        Collier Salina [facility] 10/26/2014 2:26 PM

## 2014-10-26 NOTE — Progress Notes (Signed)
Pt a/o, no c/o pain, pt c/o anxiety this am PRN Xanax given as ordered, pt NPO for heart cath today, VSS pt stable

## 2014-10-26 NOTE — Progress Notes (Signed)
TR Band removed. No bleeding noted at insertion site before or after removing TR band and is a level zero. Patient complains of no pain or discomfort at wrist area of where TR band was located. Will continue to monitor patient and site of radial Cath as needed to the end of shift.

## 2014-10-26 NOTE — Progress Notes (Signed)
Subjective:  No SOB  Objective:  Vital Signs in the last 24 hours: Temp:  [97.7 F (36.5 C)-98.2 F (36.8 C)] 98 F (36.7 C) (08/03 0514) Pulse Rate:  [58-62] 58 (08/03 0514) Resp:  [18] 18 (08/03 0514) BP: (136-153)/(63-75) 140/65 mmHg (08/03 0514) SpO2:  [97 %-99 %] 99 % (08/03 0514) Weight:  [170 lb 9.6 oz (77.384 kg)] 170 lb 9.6 oz (77.384 kg) (08/03 0514)  Intake/Output from previous day:  Intake/Output Summary (Last 24 hours) at 10/26/14 0842 Last data filed at 10/26/14 0518  Gross per 24 hour  Intake   1200 ml  Output   1775 ml  Net   -575 ml    Physical Exam: General appearance: alert, cooperative and no distress Neck: no carotid bruit and no JVD Lungs: clear to auscultation bilaterally Heart: regular rate and rhythm Skin: Skin color, texture, turgor normal. No rashes or lesions Neurologic: Grossly normal   Rate: 58  Rhythm: sinus bradycardia  Lab Results:  Recent Labs  10/24/14 0323  WBC 10.5  HGB 13.5  PLT 149*    Recent Labs  10/24/14 0323  NA 139  K 4.1  CL 105  CO2 27  GLUCOSE 95  BUN 13  CREATININE 1.12*   No results for input(s): TROPONINI in the last 72 hours.  Invalid input(s): CK, MB No results for input(s): INR in the last 72 hours.  Scheduled Meds: . amLODipine  10 mg Oral Daily  . aspirin EC  81 mg Oral Daily  . atorvastatin  80 mg Oral Daily  . buPROPion  150 mg Oral Daily  . carvedilol  25 mg Oral BID WC  . clopidogrel  75 mg Oral Daily  . furosemide  40 mg Oral BID  . potassium chloride  20 mEq Oral BID  . sodium chloride  3 mL Intravenous Q12H   Continuous Infusions: . nitroGLYCERIN Stopped (10/22/14 1200)   PRN Meds:.sodium chloride, acetaminophen, ALPRAZolam, cyclobenzaprine, HYDROcodone-acetaminophen, magnesium hydroxide, nitroGLYCERIN, ondansetron (ZOFRAN) IV, phenol, sodium chloride, traMADol, traZODone, zolpidem   Imaging: Imaging results have been reviewed  Cardiac Studies: TEE 10/24/14 Study  Conclusions  - Left ventricle: Systolic function was normal. The estimated ejection fraction was in the range of 55% to 60%. - Aortic valve: There was moderate regurgitation. - Mitral valve: There was severe regurgitation. - Left atrium: No evidence of thrombus in the atrial cavity or appendage.   Assessment/Plan:  64 y/o AA female with a history of CAD, S/P LAD DES 2009. She has PVD as well and is s/p LCE in August of 2009, REIA stenting and bilateral renal artery stenting in 2009. She had left femoropopliteal bypass grafting by Dr. Kellie Simmering in 2012.  Cath done 08/24/14 for chest pain revealed distal stent restenosis of 70% that was not felt to be hemodynamically significant and the plan is for medical Rx. She was seen in f/u 09/07/14 and was doing well. An echo done during that admission showed an EF of 60-65% with moderate to severe MR and mild to moderate AR and grade 2 diastolic dysfunction.She was admitted again 10/21/14 with CHF. Currently undergoing work up by CT surgery for possible valve surgery/ CABG.   Principal Problem:   Acute on chronic diastolic congestive heart failure Active Problems:   Severe mitral regurgitation   Essential hypertension, benign   CAD S/P LAD DES 2009 with 70% ISR 08/24/14   PVD- s/p multiple proceedures   Troponin level elevated-(felt to be from CHF)   Diastolic dysfunction,  grade 2 by echo June 2016   Moderate aortic regurgitation   Hyperlipidemia   History of angioedema with ACE 2013   Pulmonary nodule, right-needs repeat CT in Dec 2016   Smoker- 5 a day   PLAN: PFTs ordered for today. CHF appears compensated.   Kerin Ransom PA-C 10/26/2014, 8:42 AM [phone]  I have personally seen and examined this patient with Kerin Ransom, PA-C. I agree with the assessment and plan as outlined above. She was admitted with CHF and found to have severe MR. She has been diuresed and is feeling much better. TEE with severe MR. CT surgery has seen her to discuss  MVR. Full consult note is still pending. Will arrange right and left heart cath today with Dr. Martinique. She did have a left heart cath 2 months ago and had moderate LAD disease. I think it will be important to reassess her coronary arteries before her MVR. Will check BMET,CBC and INR now. Cath this afternoon. NPO.   Leeam Cedrone 10/26/2014 10:23 AM

## 2014-10-26 NOTE — H&P (View-Only) (Signed)
Subjective:  No SOB  Objective:  Vital Signs in the last 24 hours: Temp:  [97.7 F (36.5 C)-98.2 F (36.8 C)] 98 F (36.7 C) (08/03 0514) Pulse Rate:  [58-62] 58 (08/03 0514) Resp:  [18] 18 (08/03 0514) BP: (136-153)/(63-75) 140/65 mmHg (08/03 0514) SpO2:  [97 %-99 %] 99 % (08/03 0514) Weight:  [170 lb 9.6 oz (77.384 kg)] 170 lb 9.6 oz (77.384 kg) (08/03 0514)  Intake/Output from previous day:  Intake/Output Summary (Last 24 hours) at 10/26/14 0842 Last data filed at 10/26/14 0518  Gross per 24 hour  Intake   1200 ml  Output   1775 ml  Net   -575 ml    Physical Exam: General appearance: alert, cooperative and no distress Neck: no carotid bruit and no JVD Lungs: clear to auscultation bilaterally Heart: regular rate and rhythm Skin: Skin color, texture, turgor normal. No rashes or lesions Neurologic: Grossly normal   Rate: 58  Rhythm: sinus bradycardia  Lab Results:  Recent Labs  10/24/14 0323  WBC 10.5  HGB 13.5  PLT 149*    Recent Labs  10/24/14 0323  NA 139  K 4.1  CL 105  CO2 27  GLUCOSE 95  BUN 13  CREATININE 1.12*   No results for input(s): TROPONINI in the last 72 hours.  Invalid input(s): CK, MB No results for input(s): INR in the last 72 hours.  Scheduled Meds: . amLODipine  10 mg Oral Daily  . aspirin EC  81 mg Oral Daily  . atorvastatin  80 mg Oral Daily  . buPROPion  150 mg Oral Daily  . carvedilol  25 mg Oral BID WC  . clopidogrel  75 mg Oral Daily  . furosemide  40 mg Oral BID  . potassium chloride  20 mEq Oral BID  . sodium chloride  3 mL Intravenous Q12H   Continuous Infusions: . nitroGLYCERIN Stopped (10/22/14 1200)   PRN Meds:.sodium chloride, acetaminophen, ALPRAZolam, cyclobenzaprine, HYDROcodone-acetaminophen, magnesium hydroxide, nitroGLYCERIN, ondansetron (ZOFRAN) IV, phenol, sodium chloride, traMADol, traZODone, zolpidem   Imaging: Imaging results have been reviewed  Cardiac Studies: TEE 10/24/14 Study  Conclusions  - Left ventricle: Systolic function was normal. The estimated ejection fraction was in the range of 55% to 60%. - Aortic valve: There was moderate regurgitation. - Mitral valve: There was severe regurgitation. - Left atrium: No evidence of thrombus in the atrial cavity or appendage.   Assessment/Plan:  64 y/o AA female with a history of CAD, S/P LAD DES 2009. She has PVD as well and is s/p LCE in August of 2009, REIA stenting and bilateral renal artery stenting in 2009. She had left femoropopliteal bypass grafting by Dr. Kellie Simmering in 2012.  Cath done 08/24/14 for chest pain revealed distal stent restenosis of 70% that was not felt to be hemodynamically significant and the plan is for medical Rx. She was seen in f/u 09/07/14 and was doing well. An echo done during that admission showed an EF of 60-65% with moderate to severe MR and mild to moderate AR and grade 2 diastolic dysfunction.She was admitted again 10/21/14 with CHF. Currently undergoing work up by CT surgery for possible valve surgery/ CABG.   Principal Problem:   Acute on chronic diastolic congestive heart failure Active Problems:   Severe mitral regurgitation   Essential hypertension, benign   CAD S/P LAD DES 2009 with 70% ISR 08/24/14   PVD- s/p multiple proceedures   Troponin level elevated-(felt to be from CHF)   Diastolic dysfunction,  grade 2 by echo June 2016   Moderate aortic regurgitation   Hyperlipidemia   History of angioedema with ACE 2013   Pulmonary nodule, right-needs repeat CT in Dec 2016   Smoker- 5 a day   PLAN: PFTs ordered for today. CHF appears compensated.   Kerin Ransom PA-C 10/26/2014, 8:42 AM [phone]  I have personally seen and examined this patient with Kerin Ransom, PA-C. I agree with the assessment and plan as outlined above. She was admitted with CHF and found to have severe MR. She has been diuresed and is feeling much better. TEE with severe MR. CT surgery has seen her to discuss  MVR. Full consult note is still pending. Will arrange right and left heart cath today with Dr. Martinique. She did have a left heart cath 2 months ago and had moderate LAD disease. I think it will be important to reassess her coronary arteries before her MVR. Will check BMET,CBC and INR now. Cath this afternoon. NPO.   MCALHANY,CHRISTOPHER 10/26/2014 10:23 AM

## 2014-10-27 ENCOUNTER — Encounter (HOSPITAL_COMMUNITY): Payer: Self-pay | Admitting: Cardiology

## 2014-10-27 ENCOUNTER — Other Ambulatory Visit (HOSPITAL_COMMUNITY): Payer: Self-pay | Admitting: Respiratory Therapy

## 2014-10-27 ENCOUNTER — Inpatient Hospital Stay (HOSPITAL_COMMUNITY): Payer: Medicaid Other

## 2014-10-27 DIAGNOSIS — I340 Nonrheumatic mitral (valve) insufficiency: Secondary | ICD-10-CM

## 2014-10-27 LAB — PULMONARY FUNCTION TEST
DL/VA % pred: 45 %
DL/VA: 2.54 ml/min/mmHg/L
DLCO cor % pred: 31 %
DLCO cor: 10.88 ml/min/mmHg
DLCO unc % pred: 32 %
DLCO unc: 11.38 ml/min/mmHg
FEF 25-75 Post: 1.24 L/sec
FEF 25-75 Pre: 1.49 L/sec
FEF2575-%Change-Post: -16 %
FEF2575-%Pred-Post: 47 %
FEF2575-%Pred-Pre: 56 %
FEV1-%Change-Post: -3 %
FEV1-%Pred-Post: 74 %
FEV1-%Pred-Pre: 77 %
FEV1-Post: 2.11 L
FEV1-Pre: 2.18 L
FEV1FVC-%Change-Post: 11 %
FEV1FVC-%Pred-Pre: 91 %
FEV6-%Change-Post: -13 %
FEV6-%Pred-Post: 74 %
FEV6-%Pred-Pre: 86 %
FEV6-Post: 2.58 L
FEV6-Pre: 3 L
FEV6FVC-%Change-Post: 0 %
FEV6FVC-%Pred-Post: 103 %
FEV6FVC-%Pred-Pre: 102 %
FVC-%Change-Post: -13 %
FVC-%Pred-Post: 73 %
FVC-%Pred-Pre: 84 %
FVC-Post: 2.61 L
FVC-Pre: 3.02 L
Post FEV1/FVC ratio: 81 %
Post FEV6/FVC ratio: 100 %
Pre FEV1/FVC ratio: 72 %
Pre FEV6/FVC Ratio: 99 %
RV % pred: 85 %
RV: 2.07 L
TLC % pred: 73 %
TLC: 4.6 L

## 2014-10-27 MED ORDER — ALBUTEROL SULFATE (2.5 MG/3ML) 0.083% IN NEBU
2.5000 mg | INHALATION_SOLUTION | Freq: Once | RESPIRATORY_TRACT | Status: AC
  Administered 2014-10-27: 2.5 mg via RESPIRATORY_TRACT

## 2014-10-27 NOTE — Progress Notes (Signed)
Patient Name: Leah [name] [place] Date of Encounter: 10/27/2014  Principal Problem:   Acute on chronic diastolic congestive heart failure Active Problems:   Hyperlipidemia   Essential hypertension, benign   CAD S/P LAD DES 2009 with 70% ISR 08/24/14   PVD- s/p multiple proceedures   History of angioedema with ACE 2013   Severe mitral regurgitation   Pulmonary nodule, right-needs repeat CT in Dec 2016   Smoker- 5 a day   Troponin level elevated-(felt to be from CHF)   Diastolic dysfunction, grade 2 by echo June 2016   Moderate aortic regurgitation   Renal vascular disease   Primary Cardiologist: Dr. Martinique  Patient Profile: 64 yo female with h/o CAD (s/p PCI/stenting of mid LAD in 08/2007, PVD s/p L carotid endarterectomy in 10/2007, R ext iliac stent and bilateral renal artery stenting in 2009, L fempop bypass in 2012). Cath 08/24/2014 revealed distal stent restenosis of 70% that was not felt to be hemodynamically significant. Echo showed EF 60-65% with moderate to severe MR and mild to moderate AR and grade 2 diastolic dysfunction. She presented with dyspnea despite compliance with her meds. Currently undergoing work up by CT surgery for possible valve surgery.  SUBJECTIVE: Pt denies any chest pain or shortness of breath. Denies any nausea, vomiting, exertional dyspnea.  OBJECTIVE Filed Vitals:   10/26/14 1730 10/26/14 1800 10/26/14 2025 10/27/14 0531  BP: 142/57 148/60 143/67 131/65  Pulse: 78 82 55 57  Temp:   97.8 F (36.6 C) 98 F (36.7 C)  TempSrc:   Oral Oral  Resp:    18  Height:      Weight:    171 lb 4.8 oz (77.701 kg)  SpO2:   96% 97%    Intake/Output Summary (Last 24 hours) at 10/27/14 1035 Last data filed at 10/27/14 0932  Gross per 24 hour  Intake    480 ml  Output    700 ml  Net   -220 ml   Filed Weights   10/25/14 0531 10/26/14 0514 10/27/14 0531  Weight: 171 lb 1.2 oz (77.6 kg) 170 lb 9.6 oz (77.384 kg) 171 lb 4.8 oz (77.701 kg)    PHYSICAL  EXAM General: Well developed, well nourished, female in no acute distress. Head: Normocephalic, atraumatic.  Neck: Supple without bruits, JVD. Lungs:  Resp regular and unlabored, CTA without wheezing or rales. Heart: RRR, S1, S2, no S3, S4, no rub. Abdomen: Soft, non-tender, non-distended, BS + x 4.  Extremities: No clubbing, no cyanosis, no edema. Cath site on right wrist without discoloration or tenderness. Neuro: Alert and oriented X 3. Moves all extremities spontaneously. Psych: Normal affect.  LABS: CBC: Recent Labs  10/26/14 1310  WBC 9.9  HGB 15.0  HCT 46.0  MCV 79.9  PLT 190   INR: Recent Labs  10/26/14 1310  INR 4.85   Basic Metabolic Panel: Recent Labs  10/26/14 1310  NA 134*  K 4.7  CL 97*  CO2 24  GLUCOSE 101*  BUN 22*  CREATININE 1.27*  CALCIUM 10.0   BNP:  B NATRIURETIC PEPTIDE  Date/Time Value Ref Range Status  10/21/2014 11:45 AM 1223.3* 0.0 - 100.0 pg/mL Final   TELE:  HR 45-60's. No irregular rhythms.      CATH: 10/26/2014  Mid RCA lesion, 20% stenosed.  Ost Cx lesion, 30% stenosed.  Mid Cx lesion, 30% stenosed.  Mid LAD to Dist LAD lesion, 70% stenosed. A drug-eluting stent was placed. The lesion was previously treated  with a drug-eluting stent greater than two years ago.  2nd Diag lesion, 30% stenosed.  Ost LAD lesion, 70% stenosed.  1. Moderate single vessel obstructive CAD. There is a 70% ostial stenosis noted in the RAO caudal view that was not noted on prior angiogram. There is a 70% stenosis in the distal portion of the prior LAD stent 2. Low normal LV function- EF approximately 50%. 3. Moderate to severe MR 3+ 4. Normal right heart pressures. Initial filling pressures were remarkably low due to aggressive diuresis.   Recommendation: Surgical evaluation to consider MV repair/replacement and single vessel CABG to reduce risk of recurrent CHF and worsening LV function.     Current Medications:  . amLODipine  10 mg Oral Daily   . aspirin EC  81 mg Oral Daily  . atorvastatin  80 mg Oral Daily  . buPROPion  150 mg Oral Daily  . carvedilol  25 mg Oral BID WC  . clopidogrel  75 mg Oral Daily  . furosemide  40 mg Oral Daily  . heparin  5,000 Units Subcutaneous 3 times per day  . potassium chloride  20 mEq Oral BID  . sodium chloride  3 mL Intravenous Q12H  . sodium chloride  3 mL Intravenous Q12H      ASSESSMENT AND PLAN: 1. Acute diastolic HF in the setting severe MR - weight down from 183 to 171.  - Continue PO lasix '40mg'$  BID and KCL 34mq BID.  2. Moderate to severe MR. Moderate AI: - s/p TEE on 8/1 with severe MR, moderate AI. Cath on 10/26/14 showed moderate to severe 3+ MR. - awaiting final evaluation and recs by CT surgery for potential MV replacement and single vessel CABG. CT chest today per CT surgery to evaluate lung mass.   4. CAD - Cath on 10/26/14 showed 70% ostial LAD stenosis and 70% mid LAD stenosis. She will need single vessel bypass at the time of MVR  - continue aspirin '81mg'$  daily, Lipitor '80mg'$  daily, Coreg '25mg'$  BID. Will hold Plavix now in anticipation of surgery in next few weeks. (Last stent in LAD in 2009)  Kani Chauvin 10/27/2014 11:24 AM

## 2014-10-27 NOTE — Progress Notes (Signed)
CARDIAC REHAB PHASE I   PRE:  Rate/Rhythm: 72 sR  BP:  Sitting: 138/63        SaO2: 100 RA  MODE:  Ambulation: 460 ft   POST:  Rate/Rhythm: 68 sR  BP:  Sitting: 132/67        SaO2: 100 RA  Pt up in chair, watching tv. Pt states she is still unsure when she will have surgery, eager to walk. Pt ambulated 460 ft on RA, independent, steady gait, tolerated well. Pt c/o mild DOE, fatigue, denies CP, dizziness, brief standing rest x1. DOE improved with rest. Encouraged pt to review CHF materials at bedside, discussed tobacco cessation, pt verbalized understanding. Pt to bed after walk per pt request, call bell within reach. Will follow and give pr-op ed when appropriate.  [id number]   [name], RN, BSN 10/27/2014 1:46 PM

## 2014-10-27 NOTE — Progress Notes (Addendum)
Patient ID: Leah [name], female   DOB: 1951-01-15, 64 y.o.   MRN: [id number]      [address]       [place] [address]             [phone]                 1 Day Post-Op Procedure(s) (LRB): Right/Left Heart Cath and Coronary Angiography (N/A)  LOS: 6 days   Subjective: Feels  Well today, no sob or chest pain waiting for CT of chest  Objective: Vital signs in last 24 hours: Patient Vitals for the past 24 hrs:  BP Temp Temp src Pulse Resp SpO2 Weight  10/27/14 1425 107/71 mmHg 98.1 F (36.7 C) Oral 64 18 100 % -  10/27/14 0531 131/65 mmHg 98 F (36.7 C) Oral (!) 57 18 97 % 171 lb 4.8 oz (77.701 kg)  10/26/14 2025 (!) 143/67 mmHg 97.8 F (36.6 C) Oral (!) 55 - 96 % -    Filed Weights   10/25/14 0531 10/26/14 0514 10/27/14 0531  Weight: 171 lb 1.2 oz (77.6 kg) 170 lb 9.6 oz (77.384 kg) 171 lb 4.8 oz (77.701 kg)    Hemodynamic parameters for last 24 hours:    Intake/Output from previous day: 08/03 0701 - 08/04 0700 In: 600 [P.O.:600] Out: 700 [Urine:700] Intake/Output this shift: Total I/O In: 580 [P.O.:580] Out: 1500 [Urine:1500]  Scheduled Meds: . amLODipine  10 mg Oral Daily  . aspirin EC  81 mg Oral Daily  . atorvastatin  80 mg Oral Daily  . buPROPion  150 mg Oral Daily  . carvedilol  25 mg Oral BID WC  . clopidogrel  75 mg Oral Daily  . furosemide  40 mg Oral Daily  . heparin  5,000 Units Subcutaneous 3 times per day  . potassium chloride  20 mEq Oral BID  . sodium chloride  3 mL Intravenous Q12H  . sodium chloride  3 mL Intravenous Q12H   Continuous Infusions:  PRN Meds:.sodium chloride, sodium chloride, acetaminophen, ALPRAZolam, cyclobenzaprine, HYDROcodone-acetaminophen, magnesium hydroxide, nitroGLYCERIN, ondansetron (ZOFRAN) IV, phenol, sodium chloride, sodium chloride, traMADol, traZODone, zolpidem  General appearance: alert and cooperative Neurologic: intact Heart: systolic murmur: early systolic 3/6, blowing at apex,  radiates to axilla Lungs: clear to auscultation bilaterally Abdomen: soft, non-tender; bowel sounds normal; no masses,  no organomegaly Extremities: extremities normal, atraumatic, no cyanosis or edema and Homans sign is negative, no sign of DVT   Lab Results: CBC: Recent Labs  10/26/14 1310  WBC 9.9  HGB 15.0  HCT 46.0  PLT 190   BMET:  Recent Labs  10/26/14 1310  NA 134*  K 4.7  CL 97*  CO2 24  GLUCOSE 101*  BUN 22*  CREATININE 1.27*  CALCIUM 10.0    PT/INR:  Recent Labs  10/26/14 1310  LABPROT 14.3  INR 1.09   Chronic Kidney Disease   Stage I     GFR >90  Stage II    GFR 60-89  Stage IIIA GFR 45-59  Stage IIIB GFR 30-44  Stage IV   GFR 15-29  Stage V    GFR  <15  Lab Results  Component Value Date   CREATININE 1.27* 10/26/2014   Estimated Creatinine Clearance: 55.4 mL/min (by C-G formula based on Cr of 1.27).  CATH:  Mid RCA lesion, 20% stenosed.  Ost Cx lesion, 30% stenosed.  Mid Cx lesion, 30% stenosed.  Mid LAD to Dist LAD lesion, 70% stenosed.  A drug-eluting stent was placed. The lesion was previously treated with a drug-eluting stent greater than two years ago.  2nd Diag lesion, 30% stenosed.  Ost LAD lesion, 70% stenosed.  1. Moderate single vessel obstructive CAD. There is a 70% ostial stenosis noted in the RAO caudal view that was not noted on prior angiogram. There is a 70% stenosis in the distal portion of the prior LAD stent 2. Low normal LV function- EF approximately 50%. 3. Moderate to severe MR 3+ 4. Normal right heart pressures. Initial filling pressures were remarkably low due to aggressive diuresis.   Recommendation: Surgical evaluation to consider MV repair/replacement and single vessel CABG to reduce risk of recurrent CHF and worsening LV function.    I have independently reviewed the above  cath films and reviewed the findings with the  patient .    Radiology Waiting for follow up CT of chest, not done yet  PFT's:  FEV1 2.18 77%, DLCO  11.38  32% Interpretation: The FEV1, FEV1/FVC ratio and FEF25-75% are reduced indicating airway obstruction. The airway resistance is normal. The TLC and SVC are reduced, but the FRC is normal. Following administration of bronchodilators, there is no significant response. The reduced diffusing capacity indicates a severe loss of functional alveolar capillary surface. Conclusions: The diffusion defect and reduced lung volumes suggest an early parenchymal process. Minimal airway obstruction is present. In view of the severity of the diffusion defect, studies with exercise would be helpful to evaluate the presence of hypoxemia. Pulmonary Function Diagnosis: Minimal Obstructive Airways Disease Mild Restriction -Parenchymal Severe Diffusion Defect   Assessment/Plan: S/P Procedure(s) (LRB): Right/Left Heart Cath and Coronary Angiography (N/A) consider MV repair/replacement , AVR and single vessel CABG-  Will need to decide tissue vs mechanical valve  Minimal Obstructive Airways Disease Mild Restriction -Parenchymal Severe Diffusion Defect- may benefit from period of time not smoking - home on medcial therpy, no smoking 3-4 weeks then elective surgery? Waiting for ct to evaluate rt lung nodule   [name] MD 10/27/2014 6:38 PM

## 2014-10-28 ENCOUNTER — Encounter (HOSPITAL_COMMUNITY): Payer: Self-pay

## 2014-10-28 ENCOUNTER — Telehealth: Payer: Self-pay | Admitting: Physician Assistant

## 2014-10-28 DIAGNOSIS — I340 Nonrheumatic mitral (valve) insufficiency: Secondary | ICD-10-CM

## 2014-10-28 DIAGNOSIS — I2510 Atherosclerotic heart disease of native coronary artery without angina pectoris: Secondary | ICD-10-CM

## 2014-10-28 LAB — BASIC METABOLIC PANEL
ANION GAP: 9 (ref 5–15)
BUN: 21 mg/dL — ABNORMAL HIGH (ref 6–20)
CALCIUM: 9.6 mg/dL (ref 8.9–10.3)
CHLORIDE: 99 mmol/L — AB (ref 101–111)
CO2: 25 mmol/L (ref 22–32)
Creatinine, Ser: 1.14 mg/dL — ABNORMAL HIGH (ref 0.44–1.00)
GFR calc Af Amer: 59 mL/min — ABNORMAL LOW (ref >60)
GFR, EST NON AFRICAN AMERICAN: 51 mL/min — AB (ref >60)
GLUCOSE: 91 mg/dL (ref 65–99)
Potassium: 4.3 mmol/L (ref 3.5–5.1)
Sodium: 133 mmol/L — ABNORMAL LOW (ref 135–145)

## 2014-10-28 MED ORDER — FUROSEMIDE 40 MG PO TABS: 40.0000 mg | ORAL_TABLET | Freq: Every day | ORAL | Status: DC

## 2014-10-28 MED ORDER — NITROGLYCERIN 0.4 MG SL SUBL: 0.4000 mg | SUBLINGUAL_TABLET | SUBLINGUAL | Status: DC | PRN

## 2014-10-28 MED ORDER — POTASSIUM CHLORIDE CRYS ER 20 MEQ PO TBCR: 20.0000 meq | EXTENDED_RELEASE_TABLET | Freq: Every day | ORAL | Status: DC

## 2014-10-28 NOTE — Telephone Encounter (Signed)
TCM per [place]   11/04/14 @ 9am w/ Suanne Marker   Pt of Dr Martinique

## 2014-10-28 NOTE — Telephone Encounter (Signed)
Discharged 10/28/14  Needs TOC call 8/8 or 8/9

## 2014-10-28 NOTE — Progress Notes (Signed)
Discharge instructions completed with patient.  Med list and appointments reviewed.  Medication regimen understood.

## 2014-10-28 NOTE — Discharge Summary (Signed)
CARDIOLOGY DISCHARGE SUMMARY   Patient ID: Leah [name] MRN: [id number] DOB/AGE: 10/19/1950 64 y.o.  Admit date: 10/21/2014 Discharge date: 10/28/2014  PCP: Kathrine Cords, MD Primary Cardiologist: Dr. Martinique  Primary Discharge Diagnosis:  Acute on Chronic Diastolic Congestive Heart Failure Secondary Discharge Diagnosis: Coronary Artery Disease               Severe Mitral Regurgitation  Consults: Cardiothoracic Surgery, Cardiac Rehab  Procedures: Right and Left Heart Catheterization, Coronary Angiography, Transesophageal Echocardiogram, Upper and Lower Extremity Dopplers, Chest CT, PFTs  Hospital Course: Leah [name] is a 64 y.o. female with a history of CAD (s/p PCI/stenting of mid LAD in 08/2007; Cath on 08/24/14 showed distal stent restenosis of 70% but managed with medical therapy), PVD (s/p L carotid endarterectomy in 10/2007, R ext iliac stent and bilateral renal artery stenting in 2009, L fempop bypass in 2012), HTN, HLD, moderate aortic regurgitation, severe mitral regurgitation, and right upper lung nodule.  She presented to the [facility] ED on 10/21/14 with the chief complaint of dyspnea for 4 days. She reported compliance with her medications at that time. She did not have any chest pain at that time but BNP was elevated to 1223 and Troponin was elevated at 0.08. She was also volume overloaded on physical exam and SBP was elevated in the 180's. Lasix was given '40mg'$  IV 3 x daily, with potassium being replaced as well. She was also started on a NTG drip to help with afterload reduction as well as a Heparin drip for her elevated troponin.  Troponins were cycled, reading 0.08, 0.10, and 0.11 respectively without any noted EKG changes. Her nitroglycerin drip was reduced to the patient developing severe headaches and was eventually stopped. The patient's dyspnea improved and her Lasix dosage was decreased in frequency from every 8 hours to BID.  TEE was performed on 10/24/14 which  showed normal EF of 55-60% along with moderate aortic valve regurgitation and severe mitral valve regurgitation. The decision was made to consult Cardiothoracic Surgery regarding possible mitral valve replacement.   Cardiothoracic surgery was consulted and said the patient likely needs MVR, AVR and CABG. They recommended a  pre-op workup of PFTs, cardiac cath, a new chest CT (due to lung lesion), upper and lower extremity dopplers, smoking cessation, the cessation of Plavix for 5 days before surgery, and upper/lower extremity dopplers. On 10/25/14, the patient was also changed from IV to PO Lasix. Her dyspnea was still improved and her weight was at 171 lbs, down from 183 lbs at admission.  The decision was made to proceed with a cardiac catheterization on 10/26/14 which was performed by Dr. Martinique. The risks and benefits of the procedure were explained in detail to the patient. Cath showed moderate single vessel obstructive CAD,  a 70% ostial stenosis noted in the RAO caudal view that was not noted on prior angiogram. Cath also showed a 70% stenosis in the distal portion of the prior LAD stent. Moderate to severe MR was also noted and it was recommended for the patient to undergo surgical evaluation to consider single vessel CABG with the MVR to reduce risk of recurrent CHF and worsening LV function. The patient experienced no complication from the procedure.  The Chest CT recommended by Cardiothoracic Surgery was obtained on 10/27/2014 and showed the nodule was more likely to be postinfectious in nature, though malignancy could not be entirely excluded.They recommend a follow-up CT in 3-6 months. PFT's was also carried out and showed  a severe diffusion defect. This knowledge led Dr. Servando Snare to recommend sending the patient home on medical therapy, not smoking for 3-4 weeks, then having elective surgery.  On 08/05, the patient was last seen by Dr. Angelena Form and deemed stable for discharge. The patient was  thoroughly educated on the importance of smoking cessation for her upcoming surgery. She was also educated on avoiding salty foods and keeping up with her daily weight. Thje patient has scheduled follow-up with Dr. Servando Snare on 10/31/14 and follow-up with Cardiology on 08/11. Of note, she will need to be off her Plavix for 5 days prior to surgery.   Labs:   Lab Results  Component Value Date   WBC 9.9 10/26/2014   HGB 15.0 10/26/2014   HCT 46.0 10/26/2014   MCV 79.9 10/26/2014   PLT 190 10/26/2014     Recent Labs Lab 10/28/14 0429  NA 133*  K 4.3  CL 99*  CO2 25  BUN 21*  CREATININE 1.14*  CALCIUM 9.6  GLUCOSE 91   B NATRIURETIC PEPTIDE  Date/Time Value Ref Range Status  10/21/2014 11:45 AM 1223.3* 0.0 - 100.0 pg/mL Final    Recent Labs  10/26/14 1310  INR 1.09      Radiology: Dg Chest 2 View: 10/21/2014    IMPRESSION: 1. Cardiomegaly with central pulmonary vascular congestion and bilateral interstitial edema suggesting congestive heart failure/volume overload. 2. Small bilateral pleural effusions, with probable adjacent atelectasis. 3. The 5 mm right apical nodule described on previous chest CT of 08/25/2014 is not able to be visualized on this chest x-ray. Please note that a follow-up chest CT in 6-12 months was recommended on that previous report.   Electronically Signed   By: Franki Cabot M.D.   On: 10/21/2014 12:05   Dg Chest Port 1 View: 10/23/2014   CLINICAL DATA:  CHF  EXAM: PORTABLE CHEST - 1 VIEW  COMPARISON:  10/21/2014  FINDINGS: Hyperinflation of the lungs. Mild cardiomegaly. Right base airspace opacity slightly improved, likely residual atelectasis. Small right pleural effusion.  IMPRESSION: Improving right basilar aeration with continued right basilar atelectasis and small right effusion.   Electronically Signed   By: Rolm Baptise M.D.   On: 10/23/2014 09:13   Ct Chest Without Contrast: 10/27/2014   IMPRESSION: 1. Small slightly spiculated nodule at the right lung  apex is again seen. It has a small cystic component which appears to have increased mildly in size, while the solid component has decreased slightly in size, measuring approximately 5 mm. Given the slight apparent interval decrease in size of the solid component from the recent prior CT, this is thought more likely to be postinfectious in nature, though malignancy cannot be entirely excluded. As this remains too small to further characterize, would recommend strict follow-up CT of the chest in 3-6 months, to ensure stability. 2. Mild scarring or atelectasis at the left lung base. 3. Scattered coronary artery calcifications seen.   Electronically Signed   By: Garald Balding M.D.   On: 10/27/2014 19:40    Echo: 10/24/2014 Study Conclusions: - Left ventricle: Systolic function was normal. The estimated ejection fraction was in the range of 55% to 60%. - Aortic valve: There was moderate regurgitation. - Mitral valve: There was severe regurgitation. - Left atrium: No evidence of thrombus in the atrial cavity or appendage.  Cardiovascular Dopplers: 10/25/2014 Carotid Findings: Study was technically limited due to patient anatomy. Findings suggest 1-39% internal carotid artery stenosis bilaterally. Vertebral arteries are patent with antegrade flow.  Findings:  Bilateral ABIs are suggestive of moderate arterial insufficiency at rest.  There is evidence of elevated velocities suggestive of a 75-99% stenosis of the right profunda femoral artery, and 50-74% stenosis of the mid right femoral artery. There is no obvious evidence of restenosis of the left fem-pop bypass. There is no obvious evidence of hemodynamically significant stenosis of the left lower extremity. Unable to insonate the left dorsalis pedis artery.    Cardiac Cath: 10/26/2014  Mid RCA lesion, 20% stenosed.  Ost Cx lesion, 30% stenosed.  Mid Cx lesion, 30% stenosed.  Mid LAD to Dist LAD lesion, 70% stenosed.The lesion was previously  treated with a drug-eluting stent greater than two years ago.  2nd Diag lesion, 30% stenosed.  Ost LAD lesion, 70% stenosed.  1. Moderate single vessel obstructive CAD. There is a 70% ostial stenosis noted in the RAO caudal view that was not noted on prior angiogram. There is a 70% stenosis in the distal portion of the prior LAD stent 2. Low normal LV function- EF approximately 50%. 3. Moderate to severe MR 3+ 4. Normal right heart pressures. Initial filling pressures were remarkably low due to aggressive diuresis.   Recommendation: Surgical evaluation to consider MV repair/replacement and single vessel CABG to reduce risk of recurrent CHF and worsening LV function.   PFT's: 10/27/2014 PFT's: FEV1 2.18 77%, DLCO 11.38 32% Interpretation: The FEV1, FEV1/FVC ratio and FEF25-75% are reduced indicating airway obstruction. The airway resistance is normal. The TLC and SVC are reduced, but the FRC is normal. Following administration of bronchodilators, there is no significant response. The reduced diffusing capacity indicates a severe loss of functional alveolar capillary surface. Conclusions: The diffusion defect and reduced lung volumes suggest an early parenchymal process. Minimal airway obstruction is present. In view of the severity of the diffusion defect, studies with exercise would be helpful to evaluate the presence of hypoxemia. Pulmonary Function Diagnosis: Minimal Obstructive Airways Disease Mild Restriction -Parenchymal Severe Diffusion Defect  FOLLOW UP PLANS AND APPOINTMENTS Allergies  Allergen Reactions  . Lisinopril Swelling    Angioedema 06/10/11  . Aspirin Other (See Comments)    Upset stomach  . Chantix [Varenicline]     insomnia  . Penicillins Hives     Medication List    TAKE these medications        acetaminophen 325 MG tablet  Commonly known as:  TYLENOL  Take 2 tablets (650 mg total) by mouth every 4 (four) hours as needed for fever, headache or mild  pain.     amLODipine 10 MG tablet  Commonly known as:  NORVASC  Take 1 tablet (10 mg total) by mouth daily.     aspirin EC 81 MG tablet  Take 1 tablet (81 mg total) by mouth daily.     atorvastatin 80 MG tablet  Commonly known as:  LIPITOR  Take 1 tablet (80 mg total) by mouth daily.     buPROPion 150 MG 24 hr tablet  Commonly known as:  WELLBUTRIN XL  Take 1 tablet (150 mg total) by mouth daily. For 3 days, then increase '300mg'$  (2 tablets) daily     carvedilol 25 MG tablet  Commonly known as:  COREG  Take 1 tablet (25 mg total) by mouth 2 (two) times daily with a meal. ONE TABLET IN AM ONE TABLET IN PM     clopidogrel 75 MG tablet  Commonly known as:  PLAVIX  Take 1 tablet (75 mg total) by mouth daily.  cyclobenzaprine 10 MG tablet  Commonly known as:  FLEXERIL  TAKE 1 TABLET BY MOUTH 3 TIMES DAILY AS NEEDED FOR MUSCLE SPASMS     furosemide 40 MG tablet  Commonly known as:  LASIX  Take 1 tablet (40 mg total) by mouth daily.     HYDROcodone-acetaminophen 5-325 MG per tablet  Commonly known as:  NORCO/VICODIN  Take 1 tablet by mouth every 6 (six) hours as needed (breakthrough pain).     nitroGLYCERIN 0.4 MG SL tablet  Commonly known as:  NITROSTAT  Place 1 tablet (0.4 mg total) under the tongue every 5 (five) minutes x 3 doses as needed for chest pain.     potassium chloride SA 20 MEQ tablet  Commonly known as:  K-DUR,KLOR-CON  Take 1 tablet (20 mEq total) by mouth daily.     traMADol 50 MG tablet  Commonly known as:  ULTRAM  Take 1 tablet (50 mg total) by mouth every 8 (eight) hours as needed.     traZODone 100 MG tablet  Commonly known as:  DESYREL  Take 1 tablet (100 mg total) by mouth at bedtime as needed for sleep.        Discharge Instructions    Diet - low sodium heart healthy    Complete by:  As directed      Increase activity slowly    Complete by:  As directed           Follow-up Information    Follow up with [name], MD On  11/10/2014.   Specialty:  Cardiothoracic Surgery   Why:  Appointment Time at 2:00 PM   Contact information:   [address] [place] [address] [phone]       Follow up with Rosaria Ferries, PA-C On 11/04/2014.   Specialties:  Cardiology, Radiology   Why:  Appointment at 9:00 AM with Rosaria Ferries, PA-C   Contact information:   [address] [place] [address] [phone]       Follow up with Peter Martinique, MD On 12/01/2014.   Specialty:  Cardiology   Why:  Appointment at 8:30 AM with Dr. Martinique   Contact information:   [address] [place] [address] [phone]       BRING ALL MEDICATIONS WITH YOU TO FOLLOW UP APPOINTMENTS  Time spent with patient to include physician time: > 30 min Signed: Rosaria Ferries, PA-C 10/28/2014, 3:46 PM Co-Sign MD

## 2014-10-28 NOTE — Progress Notes (Signed)
CARDIAC REHAB PHASE I   PRE:  Rate/Rhythm: not on tele  BP:  Supine:   Sitting: 132/65  Standing:    SaO2:   MODE:  Ambulation: 460 ft   POST:  Rate/Rhythm: 65  BP:  Supine:   Sitting: 142/57  Standing:    SaO2: 995RA 1322-1410 Pt walked 460 ft with steady gait. Tolerated well. Discussed with pt importance of sternal precautions. Demonstrated how to sit and stand without use of arms. Discussed importance of IS and walking after surgery. Staff to get pt IS prior to discharge. Gave pt [facility] booklet and care guide. Put on pre op video for pt to view. Encouraged no smoking. Pt stated she will stay with brother after discharge to assist with care.   Graylon Good, RN BSN  10/28/2014 2:05 PM

## 2014-10-28 NOTE — Progress Notes (Signed)
Patient ID: JENICA COSTILOW, female   DOB: 15-Sep-1950, 64 y.o.   MRN: [id number] Patient ID: GIDGET QUIZHPI, female   DOB: Aug 16, 1950, 64 y.o.   MRN: [id number]      [address]       [place] [address]             [phone]                 2 Days Post-Op Procedure(s) (LRB): Right/Left Heart Cath and Coronary Angiography (N/A)  LOS: 7 days   Subjective: Feels well today, ct scan of chest done last night  Objective: Vital signs in last 24 hours: Patient Vitals for the past 24 hrs:  BP Temp Temp src Pulse Resp SpO2 Weight  10/28/14 0617 134/65 mmHg 98.1 F (36.7 C) Oral (!) 57 19 98 % 170 lb 11.2 oz (77.429 kg)  10/28/14 0615 - - - - - - 170 lb 11.2 oz (77.429 kg)  10/27/14 2124 136/65 mmHg 98.4 F (36.9 C) Oral 62 18 97 % -  10/27/14 1425 107/71 mmHg 98.1 F (36.7 C) Oral 64 18 100 % -    Filed Weights   10/27/14 0531 10/28/14 0615 10/28/14 0617  Weight: 171 lb 4.8 oz (77.701 kg) 170 lb 11.2 oz (77.429 kg) 170 lb 11.2 oz (77.429 kg)    Hemodynamic parameters for last 24 hours:    Intake/Output from previous day: 08/04 0701 - 08/05 0700 In: 1300 [P.O.:1300] Out: 2300 [Urine:2300] Intake/Output this shift: Total I/O In: 120 [P.O.:120] Out: -   Scheduled Meds: . amLODipine  10 mg Oral Daily  . aspirin EC  81 mg Oral Daily  . atorvastatin  80 mg Oral Daily  . buPROPion  150 mg Oral Daily  . carvedilol  25 mg Oral BID WC  . clopidogrel  75 mg Oral Daily  . furosemide  40 mg Oral Daily  . heparin  5,000 Units Subcutaneous 3 times per day  . potassium chloride  20 mEq Oral BID  . sodium chloride  3 mL Intravenous Q12H  . sodium chloride  3 mL Intravenous Q12H   Continuous Infusions:  PRN Meds:.sodium chloride, sodium chloride, acetaminophen, ALPRAZolam, cyclobenzaprine, HYDROcodone-acetaminophen, magnesium hydroxide, nitroGLYCERIN, ondansetron (ZOFRAN) IV, phenol, sodium chloride, sodium chloride, traMADol, traZODone, zolpidem  General  appearance: alert and cooperative Neurologic: intact Heart: systolic murmur: early systolic 3/6, blowing at apex, radiates to axilla Lungs: clear to auscultation bilaterally Abdomen: soft, non-tender; bowel sounds normal; no masses,  no organomegaly Extremities: extremities normal, atraumatic, no cyanosis or edema and Homans sign is negative, no sign of DVT   Lab Results: CBC:  Recent Labs  10/26/14 1310  WBC 9.9  HGB 15.0  HCT 46.0  PLT 190   BMET:   Recent Labs  10/26/14 1310 10/28/14 0429  NA 134* 133*  K 4.7 4.3  CL 97* 99*  CO2 24 25  GLUCOSE 101* 91  BUN 22* 21*  CREATININE 1.27* 1.14*  CALCIUM 10.0 9.6    PT/INR:   Recent Labs  10/26/14 1310  LABPROT 14.3  INR 1.09   Chronic Kidney Disease   Stage I     GFR >90  Stage II    GFR 60-89  Stage IIIA GFR 45-59  Stage IIIB GFR 30-44  Stage IV   GFR 15-29  Stage V    GFR  <15  Lab Results  Component Value Date   CREATININE 1.14* 10/28/2014   Estimated Creatinine Clearance: 61.7  mL/min (by C-G formula based on Cr of 1.14).  CATH:  Mid RCA lesion, 20% stenosed.  Ost Cx lesion, 30% stenosed.  Mid Cx lesion, 30% stenosed.  Mid LAD to Dist LAD lesion, 70% stenosed. A drug-eluting stent was placed. The lesion was previously treated with a drug-eluting stent greater than two years ago.  2nd Diag lesion, 30% stenosed.  Ost LAD lesion, 70% stenosed.  1. Moderate single vessel obstructive CAD. There is a 70% ostial stenosis noted in the RAO caudal view that was not noted on prior angiogram. There is a 70% stenosis in the distal portion of the prior LAD stent 2. Low normal LV function- EF approximately 50%. 3. Moderate to severe MR 3+ 4. Normal right heart pressures. Initial filling pressures were remarkably low due to aggressive diuresis.   Recommendation: Surgical evaluation to consider MV repair/replacement and single vessel CABG to reduce risk of recurrent CHF and worsening LV function.    I have  independently reviewed the above  cath films and reviewed the findings with the  patient .    Radiology Ct Chest Without Contrast  10/27/2014   CLINICAL DATA:  Re-evaluate right upper lobe pulmonary nodule, prior to possible valve surgery. Initial encounter.  EXAM: CT CHEST WITHOUT CONTRAST  TECHNIQUE: Multidetector CT imaging of the chest was performed following the standard protocol without IV contrast.  COMPARISON:  CT of the chest performed 08/25/2014  FINDINGS: The small slightly spiculated nodule at the right lung apex is again seen; there is a small associated cystic component which appears to have increased mildly in size, while the solid component has decreased slightly in size, measuring approximately 5 mm. Given the slight apparent interval decrease in size of the solid component from the recent prior study, this is thought more likely to be postinfectious in nature, though malignancy cannot be entirely excluded.  No additional pulmonary nodules are seen. The lungs are otherwise clear, aside from mild scarring or atelectasis at the left lung base. No pleural effusion or pneumothorax is seen.  Scattered coronary artery calcifications are noted. The mediastinum is otherwise unremarkable. No mediastinal lymphadenopathy is seen. No pericardial effusion is identified. The great vessels are grossly unremarkable, aside from scattered calcification. Mild calcification is noted along the thoracic aorta. The visualized portions of the thyroid gland are unremarkable. No axillary lymphadenopathy is appreciated.  No acute osseous abnormalities are identified.  IMPRESSION: 1. Small slightly spiculated nodule at the right lung apex is again seen. It has a small cystic component which appears to have increased mildly in size, while the solid component has decreased slightly in size, measuring approximately 5 mm. Given the slight apparent interval decrease in size of the solid component from the recent prior CT, this  is thought more likely to be postinfectious in nature, though malignancy cannot be entirely excluded. As this remains too small to further characterize, would recommend strict follow-up CT of the chest in 3-6 months, to ensure stability. 2. Mild scarring or atelectasis at the left lung base. 3. Scattered coronary artery calcifications seen.   Electronically Signed   By: Garald Balding M.D.   On: 10/27/2014 19:40    PFT's: FEV1 2.18 77%, DLCO  11.38  32% Interpretation: The FEV1, FEV1/FVC ratio and FEF25-75% are reduced indicating airway obstruction. The airway resistance is normal. The TLC and SVC are reduced, but the FRC is normal. Following administration of bronchodilators, there is no significant response. The reduced diffusing capacity indicates a severe loss of functional alveolar  capillary surface. Conclusions: The diffusion defect and reduced lung volumes suggest an early parenchymal process. Minimal airway obstruction is present. In view of the severity of the diffusion defect, studies with exercise would be helpful to evaluate the presence of hypoxemia. Pulmonary Function Diagnosis: Minimal Obstructive Airways Disease Mild Restriction -Parenchymal Severe Diffusion Defect   Assessment/Plan: S/P Procedure(s) (LRB): Right/Left Heart Cath and Coronary Angiography (N/A) consider MV repair/replacement , AVR and single vessel CABG-  Will need to decide tissue vs mechanical valve  Minimal Obstructive Airways Disease Mild Restriction -Parenchymal Severe Diffusion Defect- may benefit from period of time not smoking - home on medcial therpy, no smoking 3-4 weeks then elective surgery? Right lung lesion , small and currently indeterminate I have discussed plan with Cardiology-  Dr Angelena Form, Avoid smoking 4 weeks, I will see back in office 2 weeks, appt already made and in epic. And consider double vale replacement and cabg poss date ?September 9 Continue to follow right lung nodule with  serial CT, currently too small to consider PET or direct bx  [name] MD 10/28/2014 12:16 PM

## 2014-10-28 NOTE — Progress Notes (Signed)
Patient Name: Leah [name] [place] Date of Encounter: 10/28/2014  Principal Problem:   Acute on chronic diastolic congestive heart failure Active Problems:   Hyperlipidemia   Essential hypertension, benign   CAD S/P LAD DES 2009 with 70% ISR 08/24/14   PVD- s/p multiple proceedures   History of angioedema with ACE 2013   Severe mitral regurgitation   Pulmonary nodule, right-needs repeat CT in Dec 2016   Smoker- 5 a day   Troponin level elevated-(felt to be from CHF)   Diastolic dysfunction, grade 2 by echo June 2016   Moderate aortic regurgitation   Renal vascular disease   Primary Cardiologist: Dr. Martinique Patient Profile: 64 yo female with h/o CAD (s/p PCI/stenting of mid LAD in 08/2007, PVD s/p L carotid endarterectomy in 10/2007, R ext iliac stent and bilateral renal artery stenting in 2009, L fempop bypass in 2012). Cath 08/24/2014 revealed distal stent restenosis of 70% that was not felt to be hemodynamically significant. Echo showed EF 60-65% with moderate to severe MR and mild to moderate AR and grade 2 diastolic dysfunction. She presented with dyspnea despite compliance with her meds. Currently undergoing work up by CT surgery for possible valve surgery.  SUBJECTIVE: Feeling well. Denies any chest pain, palpitations, shortness of breath, nausea, or vomiting.   OBJECTIVE Filed Vitals:   10/27/14 1425 10/27/14 2124 10/28/14 0615 10/28/14 0617  BP: 107/71 136/65  134/65  Pulse: 64 62  57  Temp: 98.1 F (36.7 C) 98.4 F (36.9 C)  98.1 F (36.7 C)  TempSrc: Oral Oral  Oral  Resp: '18 18  19  '$ Height:      Weight:   170 lb 11.2 oz (77.429 kg) 170 lb 11.2 oz (77.429 kg)  SpO2: 100% 97%  98%    Intake/Output Summary (Last 24 hours) at 10/28/14 1200 Last data filed at 10/28/14 0853  Gross per 24 hour  Intake   1300 ml  Output   1750 ml  Net   -450 ml   Filed Weights   10/27/14 0531 10/28/14 0615 10/28/14 0617  Weight: 171 lb 4.8 oz (77.701 kg) 170 lb 11.2 oz (77.429 kg)  170 lb 11.2 oz (77.429 kg)    PHYSICAL EXAM General: Well developed, well nourished, female in no acute distress. Head: Normocephalic, atraumatic.  Neck: Supple without bruits, no JVD. Lungs:  Resp regular and unlabored, CTA without wheezing or rales. Heart: RRR, S1, S2, no S3, S4, or murmur; no rub. Abdomen: Soft, non-tender, non-distended, BS + x 4.  Extremities: No clubbing, no cyanosis, no edema.  Neuro: Alert and oriented X 3. Moves all extremities spontaneously. Psych: Normal affect.  LABS: CBC: Recent Labs  10/26/14 1310  WBC 9.9  HGB 15.0  HCT 46.0  MCV 79.9  PLT 190   INR: Recent Labs  10/26/14 1310  INR 7.85   Basic Metabolic Panel: Recent Labs  10/26/14 1310 10/28/14 0429  NA 134* 133*  K 4.7 4.3  CL 97* 99*  CO2 24 25  GLUCOSE 101* 91  BUN 22* 21*  CREATININE 1.27* 1.14*  CALCIUM 10.0 9.6    BNP:  B NATRIURETIC PEPTIDE  Date/Time Value Ref Range Status  10/21/2014 11:45 AM 1223.3* 0.0 - 100.0 pg/mL Final    TELE: NSR with rate in the 50's - 60's       PFT's: 10/27/14 Minimal Obstructive Airways Disease Mild Restriction -Parenchymal Severe Diffusion Defect  Radiology/Studies: Ct Chest Without Contrast  10/27/2014   CLINICAL DATA:  Re-evaluate right upper lobe pulmonary nodule, prior to possible valve surgery. Initial encounter.  EXAM: CT CHEST WITHOUT CONTRAST  TECHNIQUE: Multidetector CT imaging of the chest was performed following the standard protocol without IV contrast.  COMPARISON:  CT of the chest performed 08/25/2014  FINDINGS: The small slightly spiculated nodule at the right lung apex is again seen; there is a small associated cystic component which appears to have increased mildly in size, while the solid component has decreased slightly in size, measuring approximately 5 mm. Given the slight apparent interval decrease in size of the solid component from the recent prior study, this is thought more likely to be postinfectious in nature,  though malignancy cannot be entirely excluded.  No additional pulmonary nodules are seen. The lungs are otherwise clear, aside from mild scarring or atelectasis at the left lung base. No pleural effusion or pneumothorax is seen.  Scattered coronary artery calcifications are noted. The mediastinum is otherwise unremarkable. No mediastinal lymphadenopathy is seen. No pericardial effusion is identified. The great vessels are grossly unremarkable, aside from scattered calcification. Mild calcification is noted along the thoracic aorta. The visualized portions of the thyroid gland are unremarkable. No axillary lymphadenopathy is appreciated.  No acute osseous abnormalities are identified.  IMPRESSION: 1. Small slightly spiculated nodule at the right lung apex is again seen. It has a small cystic component which appears to have increased mildly in size, while the solid component has decreased slightly in size, measuring approximately 5 mm. Given the slight apparent interval decrease in size of the solid component from the recent prior CT, this is thought more likely to be postinfectious in nature, though malignancy cannot be entirely excluded. As this remains too small to further characterize, would recommend strict follow-up CT of the chest in 3-6 months, to ensure stability. 2. Mild scarring or atelectasis at the left lung base. 3. Scattered coronary artery calcifications seen.   Electronically Signed   By: Garald Balding M.D.   On: 10/27/2014 19:40     Current Medications:  . amLODipine  10 mg Oral Daily  . aspirin EC  81 mg Oral Daily  . atorvastatin  80 mg Oral Daily  . buPROPion  150 mg Oral Daily  . carvedilol  25 mg Oral BID WC  . clopidogrel  75 mg Oral Daily  . furosemide  40 mg Oral Daily  . heparin  5,000 Units Subcutaneous 3 times per day  . potassium chloride  20 mEq Oral BID  . sodium chloride  3 mL Intravenous Q12H  . sodium chloride  3 mL Intravenous Q12H      ASSESSMENT AND PLAN: 1.  Acute diastolic HF in the setting severe MR - weight down from 183 to 170.  - Educated on importance of a low-sodium diet. - Continue PO lasix '40mg'$  BID and KCL 51mq BID.  2. Moderate to severe MR. Moderate AI: - s/p TEE on 8/1 with severe MR, moderate AI. Cath on 10/26/14 showed moderate to severe 3+ MR. - Dr. GServando Snarewith CT Surgery made note of her severe diffusion defect in looking at her PFT's. He recommended she go home on medical therapy, quit smoking for 3-4 weeks, then elective surgery.   3. CAD - Cath on 10/26/14 showed 70% ostial LAD stenosis and 70% mid LAD stenosis. She will need single vessel bypass at the time of MVR  - continue aspirin '81mg'$  daily, Lipitor '80mg'$  daily, Coreg '25mg'$  BID, and Amlodipine '10mg'$ . Continue Plavix for now, hold when appropriate  prior to surgery. (Last stent in LAD in 2009)  4. Tobacco Use - Spoke with the patient about the recommendations for her to quit smoking. She is aware of the importance of this.    5. Abnormal CT - Chest CT on 8/4 showed small slightly spiculated nodule at the right lung apex, thought more likely to be postinfectious in nature, though malignancy cannot be entirely excluded. Recommended follow-up CT of the chest in 3-6 months.  Signed, Barrett, Rhonda , PA-C 12:00 PM 10/28/2014   I have personally seen and examined this patient with Rosaria Ferries, PA-C. I agree with the assessment and plan as outlined above. She has diuresed well. She is now on po Lasix. Dr. Servando Snare has seen her and plans are in place for CT surgery follow up in 2 weeks to discuss MVR, AVR, single vessel bypass. Discharge home today. F/U with Dr. Servando Snare.   MCALHANY,CHRISTOPHER 10/28/2014 1:23 PM

## 2014-10-28 NOTE — Progress Notes (Signed)
Patient rested soundly during the night without any complaints of pain or discomfort. Morning medications given. Will continue to monitor patient to end of shift.

## 2014-10-31 ENCOUNTER — Ambulatory Visit (INDEPENDENT_AMBULATORY_CARE_PROVIDER_SITE_OTHER): Payer: Self-pay | Admitting: Family Medicine

## 2014-10-31 ENCOUNTER — Encounter: Payer: Self-pay | Admitting: Pharmacist

## 2014-10-31 VITALS — BP 125/67 | HR 66 | Temp 97.8°F | Ht 73.5 in | Wt 168.3 lb

## 2014-10-31 DIAGNOSIS — Z720 Tobacco use: Secondary | ICD-10-CM

## 2014-10-31 DIAGNOSIS — I5033 Acute on chronic diastolic (congestive) heart failure: Secondary | ICD-10-CM

## 2014-10-31 DIAGNOSIS — I340 Nonrheumatic mitral (valve) insufficiency: Secondary | ICD-10-CM

## 2014-10-31 DIAGNOSIS — I10 Essential (primary) hypertension: Secondary | ICD-10-CM

## 2014-10-31 MED ORDER — ALBUTEROL SULFATE HFA 108 (90 BASE) MCG/ACT IN AERS: 2.0000 | INHALATION_SPRAY | Freq: Four times a day (QID) | RESPIRATORY_TRACT | Status: DC | PRN

## 2014-10-31 MED ORDER — POTASSIUM CHLORIDE CRYS ER 20 MEQ PO TBCR: 20.0000 meq | EXTENDED_RELEASE_TABLET | Freq: Every day | ORAL | Status: DC

## 2014-10-31 NOTE — Assessment & Plan Note (Signed)
Given recent hospitalization, pt evaluated by CVTS who felt she would need MVR, AVR and possibly CABG.  - Smoking cessation very important in this patient - Pt needs to stop plavix 5d prior to surgery, will f/u with Gerhardt to determine timing.

## 2014-10-31 NOTE — Assessment & Plan Note (Signed)
No evidence of fluid overload today. SOB and orthopnea significantly improved. No PND. - continue lasix and KDur  - RTC precautions discussed: SOB, chest pain, chest tightness, increased orthopnea.

## 2014-10-31 NOTE — Telephone Encounter (Signed)
Unable to speak to Ms [place]. Child on the phone stated to call the patient back later.

## 2014-10-31 NOTE — Progress Notes (Signed)
Patient ID: Leah [name], female   DOB: March 28, 1950, 64 y.o.   MRN: [id number]    Subjective: [id number] follow up HPI: Patient is a 64 y.o. female with a past medical history of mitral regurg, chest pain, CAD, HLD, HTN, tobacco abuse presenting to clinic today for a hospital follow up.  She currently denies any chest pain or SOB. States she can walk approximately 1-2 blocks without getting SOB. No LE swelling. Improved orthopnea (down from 2-3 pillows to 1 pillow). No PND.  The previous time she contacted me we'd started her on Wellbutrin to help with smoking cessation, she was given the 1800 quit number to assist in getting free nicotine patches, and she was referred to Dr. Valentina Lucks for PFTs. She never had the Wellbutrin filled but notes she was given this during the hospital and had no problems. She has not yet contacted the 1800 quit number. She had PFTs which revealed severe diffusion defect.  Social History: last had a cig yesterday.  Health Maintenance: Pt noted to have a pulmonary nodule on CXR 10/27/14, thought to be postinfectious but suggested f/u in 3-6 months.   ROS: All other systems reviewed and are negative.  Past Medical History Patient Active Problem List   Diagnosis Date Noted  . Moderate aortic regurgitation 10/26/2014  . Renal vascular disease 10/26/2014  . Acute on chronic diastolic congestive heart failure 10/21/2014  . Tobacco use 10/21/2014  . Troponin level elevated-(felt to be from CHF) 10/21/2014  . Diastolic dysfunction, grade 2 by echo June 2016 10/21/2014  . Carpal tunnel syndrome 10/17/2014  . Left shoulder pain 09/17/2014  . Pulmonary nodule, right-needs repeat CT in Dec 2016 09/17/2014  . Abnormal CXR 08/25/2014  . Severe mitral regurgitation 08/25/2014  . Unstable angina 08/22/2014  . Hematuria 07/01/2014  . Burning sensation of feet 02/01/2014  . Jaw pain 10/18/2012  . Low back pain 07/05/2012  . Insomnia 07/05/2012  . PVC (premature  ventricular contraction) 10/02/2011  . Fatigue 09/04/2011  . Chest pain 09/04/2011  . History of angioedema with ACE 2013 06/14/2011  . PVD- s/p multiple proceedures 06/04/2011  . Hyperlipidemia 09/28/2008  . Essential hypertension, benign 09/28/2008  . CAD S/P LAD DES 2009 with 70% ISR 08/24/14 09/28/2008    Medications- reviewed and updated Current Outpatient Prescriptions  Medication Sig Dispense Refill  . acetaminophen (TYLENOL) 325 MG tablet Take 2 tablets (650 mg total) by mouth every 4 (four) hours as needed for fever, headache or mild pain.    Marland Kitchen albuterol (PROVENTIL HFA;VENTOLIN HFA) 108 (90 BASE) MCG/ACT inhaler Inhale 2 puffs into the lungs every 6 (six) hours as needed for wheezing or shortness of breath. 1 Inhaler 0  . amLODipine (NORVASC) 10 MG tablet Take 1 tablet (10 mg total) by mouth daily. 30 tablet 2  . aspirin EC 81 MG tablet Take 1 tablet (81 mg total) by mouth daily.    Marland Kitchen atorvastatin (LIPITOR) 80 MG tablet Take 1 tablet (80 mg total) by mouth daily. 30 tablet 2  . buPROPion (WELLBUTRIN XL) 150 MG 24 hr tablet Take 1 tablet (150 mg total) by mouth daily. For 3 days, then increase '300mg'$  (2 tablets) daily 60 tablet 0  . carvedilol (COREG) 25 MG tablet Take 1 tablet (25 mg total) by mouth 2 (two) times daily with a meal. ONE TABLET IN AM ONE TABLET IN PM 60 tablet 6  . clopidogrel (PLAVIX) 75 MG tablet Take 1 tablet (75 mg total) by mouth daily. 30 tablet 2  .  cyclobenzaprine (FLEXERIL) 10 MG tablet TAKE 1 TABLET BY MOUTH 3 TIMES DAILY AS NEEDED FOR MUSCLE SPASMS 70 tablet 0  . furosemide (LASIX) 40 MG tablet Take 1 tablet (40 mg total) by mouth daily. 30 tablet 6  . HYDROcodone-acetaminophen (NORCO/VICODIN) 5-325 MG per tablet Take 1 tablet by mouth every 6 (six) hours as needed (breakthrough pain). 15 tablet 0  . nitroGLYCERIN (NITROSTAT) 0.4 MG SL tablet Place 1 tablet (0.4 mg total) under the tongue every 5 (five) minutes x 3 doses as needed for chest pain. 25 tablet 12    . potassium chloride SA (K-DUR,KLOR-CON) 20 MEQ tablet Take 1 tablet (20 mEq total) by mouth daily. 30 tablet 6  . traMADol (ULTRAM) 50 MG tablet Take 1 tablet (50 mg total) by mouth every 8 (eight) hours as needed. 60 tablet 0  . traZODone (DESYREL) 100 MG tablet Take 1 tablet (100 mg total) by mouth at bedtime as needed for sleep. 30 tablet 2  . [DISCONTINUED] simvastatin (ZOCOR) 40 MG tablet Take 40 mg by mouth at bedtime.      No current facility-administered medications for this visit.    Objective: Office vital signs reviewed. BP 125/67 mmHg  Pulse 66  Temp(Src) 97.8 F (36.6 C) (Oral)  Ht 6' 1.5" (1.867 m)  Wt 168 lb 5 oz (76.346 kg)  BMI 21.90 kg/m2  SpO2 100%   Physical Examination:  General: Awake, alert, well- nourished, NAD Eyes: Conjunctiva non-injected. PERRL.  Cardio: RRR, II/VI systolic murmur noted best at the RUSB. No pitting edema. No JVD noted. 1+ DP pulses b/l. Pulm: No increased WOB.  CTAB, without wheezes, rhonchi or crackles noted.  MSK: Normal gait and station  Assessment/Plan: Essential hypertension, benign Blood pressure at goal. No side effects.  - Continue amlodipine, coreg, lasix.  - No ACE-i or ARB given h/o angioedema   Severe mitral regurgitation Given recent hospitalization, pt evaluated by CVTS who felt she would need MVR, AVR and possibly CABG.  - Smoking cessation very important in this patient - Pt needs to stop plavix 5d prior to surgery, will f/u with Gerhardt to determine timing.  Acute on chronic diastolic congestive heart failure No evidence of fluid overload today. SOB and orthopnea significantly improved. No PND. - continue lasix and KDur  - RTC precautions discussed: SOB, chest pain, chest tightness, increased orthopnea.   Tobacco use Once again discussed the importance of smoking cessation given PFTs and need for surgery. Pt voiced understanding. Pt tolerated wellbutrin '150mg'$  XL well in the hospital - Increase wellbutrin to  '300mg'$  QD, pt to fill Rx  - pt to call 1800 quit line for nicotine patches. Advised to contact our office if they did not have free patches.     No orders of the defined types were placed in this encounter.    Meds ordered this encounter  Medications  . potassium chloride SA (K-DUR,KLOR-CON) 20 MEQ tablet    Sig: Take 1 tablet (20 mEq total) by mouth daily.    Dispense:  30 tablet    Refill:  6  . albuterol (PROVENTIL HFA;VENTOLIN HFA) 108 (90 BASE) MCG/ACT inhaler    Sig: Inhale 2 puffs into the lungs every 6 (six) hours as needed for wheezing or shortness of breath.    Dispense:  1 Inhaler    Refill:  [facility] PGY-2, [place]

## 2014-10-31 NOTE — Patient Instructions (Signed)
I am glad you are feeling better. Please call Call 1800-QUIT-NOW for help with stopping smoking and getting nicotine patches  If you notice wheezing or shortness of breath, please use the albuterol inhaler (rescue inhaler) If you have chest tightness and the albuterol inhaler didn't help, please take a nitroglycerin and seek medical care.

## 2014-10-31 NOTE — Telephone Encounter (Signed)
Patient contacted regarding discharge from [facility] on 10/28/14.  Patient understands to follow up with provider Rosaria Ferries, PA on 11/04/14 at 9:00a at [facility]. Patient understands discharge instructions? yes Patient understands medications and regiment? yes Patient understands to bring all medications to this visit? yes

## 2014-10-31 NOTE — Assessment & Plan Note (Signed)
Once again discussed the importance of smoking cessation given PFTs and need for surgery. Pt voiced understanding. Pt tolerated wellbutrin '150mg'$  XL well in the hospital - Increase wellbutrin to '300mg'$  QD, pt to fill Rx  - pt to call 1800 quit line for nicotine patches. Advised to contact our office if they did not have free patches.

## 2014-10-31 NOTE — Assessment & Plan Note (Signed)
Blood pressure at goal. No side effects.  - Continue amlodipine, coreg, lasix.  - No ACE-i or ARB given h/o angioedema

## 2014-11-04 ENCOUNTER — Ambulatory Visit (INDEPENDENT_AMBULATORY_CARE_PROVIDER_SITE_OTHER): Payer: Self-pay | Admitting: Physician Assistant

## 2014-11-04 ENCOUNTER — Encounter: Payer: Self-pay | Admitting: Physician Assistant

## 2014-11-04 VITALS — BP 110/60 | HR 63 | Ht 73.5 in | Wt 171.0 lb

## 2014-11-04 DIAGNOSIS — I5032 Chronic diastolic (congestive) heart failure: Secondary | ICD-10-CM

## 2014-11-04 DIAGNOSIS — I519 Heart disease, unspecified: Secondary | ICD-10-CM

## 2014-11-04 DIAGNOSIS — I5189 Other ill-defined heart diseases: Secondary | ICD-10-CM

## 2014-11-04 NOTE — Progress Notes (Signed)
Cardiology Office Note   Date:  11/04/2014   ID:  Leah [name], [place] 17-Jun-1950, MRN [id number]  PCP:  Kathrine Cords, MD  Cardiologist:  Dr. Martinique  Alithea Lapage, PA-C   Chief Complaint  Patient presents with  . Shortness of Breath    History of Present Illness: Leah [name] is a 64 y.o. female with a history of diastolic CHF, CAD, MR, 88% ISR at last cath medically managed, CEA, iliac and renal stents, moderate AR and a right upper lobe nodule.  Leah [name] presents for post hospital follow-up after being discharged on 10/28/2014. During that admission she had heart catheterization, PFTs, chest CT, TEE and pre-CABG Dopplers. She will follow-up with Dr. Servando Snare and be evaluated for possible surgery. Her discharge weight was 170 pounds, but she was 168 pounds at an office visit on 10/31/2014.  Since discharge, she has been compliant with a low-sodium diet and has not smoked. She has not had chest pain. She denies lower extremity edema, orthopnea or PND. She is compliant with medications. She is not using a nicotine patch but is taking the Wellbutrin.   She feels well and is very motivated to quit smoking because the doctor told her that was the only way she could survive the surgery. She feels that she is doing well.   Past Medical History  Diagnosis Date  . Hyperlipidemia   . Hypertension   . GERD (gastroesophageal reflux disease)   . Leg pain   . Headache(784.0)   . Carotid artery occlusion   . COPD (chronic obstructive pulmonary disease)   . CAD (coronary artery disease)   . Peripheral vascular disease   . Tobacco abuse   . Shortness of breath dyspnea   . Renal vascular disease 10/26/2014    bilateral stents placed    Past Surgical History  Procedure Laterality Date  . Carotid endarterectomy  11/21/2007    left  . Angioplasty / stenting iliac  2010    right external iliac by Dr. Irish Lack  . Coronary angioplasty with stent placement  6/09   LAD 2.5x12 Promus  . Femoral-popliteal bypass graft  10/12    left Dr. Kellie Simmering  . Abdominal hysterectomy    . Cardiac catheterization  11/13/11    Left Heart Cath. with Coronary Angiogram  . Cardiac catheterization N/A 08/24/2014    Procedure: Left Heart Cath and Coronary Angiography;  Surgeon: Wellington Hampshire, MD;  Location: [facility];  Service: Cardiovascular;  Laterality: N/A;  . Renal artery stent Bilateral   . Tee without cardioversion N/A 10/24/2014    Procedure: TRANSESOPHAGEAL ECHOCARDIOGRAM (TEE);  Surgeon: Thayer Headings, MD;  Location: [facility];  Service: Cardiovascular;  Laterality: N/A;  . Cardiac catheterization N/A 10/26/2014    Procedure: Right/Left Heart Cath and Coronary Angiography;  Surgeon: Peter M Martinique, MD; oLAD 70%, mLAD 70% ISR, D2 30%, OFC 30%, RCA 20%, EF nl, low R heart pressures after diuresis, severe MR    Current Outpatient Prescriptions  Medication Sig Dispense Refill  . acetaminophen (TYLENOL) 325 MG tablet Take 2 tablets (650 mg total) by mouth every 4 (four) hours as needed for fever, headache or mild pain.    Marland Kitchen albuterol (PROVENTIL HFA;VENTOLIN HFA) 108 (90 BASE) MCG/ACT inhaler Inhale 2 puffs into the lungs every 6 (six) hours as needed for wheezing or shortness of breath. 1 Inhaler 0  . amLODipine (NORVASC) 10 MG tablet Take 1 tablet (10 mg total) by mouth daily. [place]  tablet 2  . aspirin EC 81 MG tablet Take 1 tablet (81 mg total) by mouth daily.    Marland Kitchen atorvastatin (LIPITOR) 80 MG tablet Take 1 tablet (80 mg total) by mouth daily. 30 tablet 2  . buPROPion (WELLBUTRIN XL) 150 MG 24 hr tablet Take 1 tablet (150 mg total) by mouth daily. For 3 days, then increase '300mg'$  (2 tablets) daily 60 tablet 0  . carvedilol (COREG) 25 MG tablet Take 1 tablet (25 mg total) by mouth 2 (two) times daily with a meal. ONE TABLET IN AM ONE TABLET IN PM 60 tablet 6  . clopidogrel (PLAVIX) 75 MG tablet Take 1 tablet (75 mg total) by mouth daily. 30 tablet 2  .  cyclobenzaprine (FLEXERIL) 10 MG tablet TAKE 1 TABLET BY MOUTH 3 TIMES DAILY AS NEEDED FOR MUSCLE SPASMS 70 tablet 0  . furosemide (LASIX) 40 MG tablet Take 1 tablet (40 mg total) by mouth daily. 30 tablet 6  . HYDROcodone-acetaminophen (NORCO/VICODIN) 5-325 MG per tablet Take 1 tablet by mouth every 6 (six) hours as needed (breakthrough pain). 15 tablet 0  . nitroGLYCERIN (NITROSTAT) 0.4 MG SL tablet Place 1 tablet (0.4 mg total) under the tongue every 5 (five) minutes x 3 doses as needed for chest pain. 25 tablet 12  . potassium chloride SA (K-DUR,KLOR-CON) 20 MEQ tablet Take 1 tablet (20 mEq total) by mouth daily. 30 tablet 6  . traMADol (ULTRAM) 50 MG tablet Take 1 tablet (50 mg total) by mouth every 8 (eight) hours as needed. 60 tablet 0  . traZODone (DESYREL) 100 MG tablet Take 1 tablet (100 mg total) by mouth at bedtime as needed for sleep. 30 tablet 2  . [DISCONTINUED] simvastatin (ZOCOR) 40 MG tablet Take 40 mg by mouth at bedtime.      No current facility-administered medications for this visit.    Allergies:   Lisinopril; Aspirin; Chantix; and Penicillins    Social History:  The patient  reports that she quit smoking 4 days ago. Her smoking use included Cigarettes. She has a 17.5 pack-year smoking history. She has never used smokeless tobacco. She reports that she does not drink alcohol or use illicit drugs.   Family History:  The patient's family history includes Cancer in her mother; Heart disease in her father; Hypertension in her brother, father, and mother; Stroke in her paternal aunt. There is no history of Heart attack.    ROS:  Please see the history of present illness. All other systems are reviewed and negative.    PHYSICAL EXAM: VS:  BP 110/60 mmHg  Pulse 63  Ht 6' 1.5" (1.867 m)  Wt 171 lb (77.565 kg)  BMI 22.25 kg/m2 , BMI Body mass index is 22.25 kg/(m^2). GEN: Well nourished, well developed, in no acute distress HEENT: normal Neck: no JVD, carotid bruits, or  masses Cardiac: RRR; systolic and diastolic murmurs, no rubs, or gallops,no edema  Respiratory:  clear to auscultation bilaterally, normal work of breathing GI: soft, nontender, nondistended, + BS MS: no deformity or atrophy Skin: warm and dry, no rash Neuro:  Strength and sensation are intact Psych: euthymic mood, full affect   EKG:  EKG is ordered today. The ekg ordered today demonstrates sinus rhythm, no acute ischemic changes, biatrial enlargement   Recent Labs: 02/28/2014: ALT 9; TSH 0.378 10/21/2014: B Natriuretic Peptide 1223.3*; Magnesium 1.7 10/26/2014: Hemoglobin 15.0; Platelets 190 10/28/2014: BUN 21*; Creatinine, Ser 1.14*; Potassium 4.3; Sodium 133*    Lipid Panel    Component  Value Date/Time   CHOL 181 08/24/2014 0538   TRIG 88 08/24/2014 0538   HDL 33* 08/24/2014 0538   CHOLHDL 5.5 08/24/2014 0538   VLDL 18 08/24/2014 0538   LDLCALC 130* 08/24/2014 0538   LDLDIRECT 185* 09/09/2011 0820     Wt Readings from Last 3 Encounters:  11/04/14 171 lb (77.565 kg)  10/31/14 168 lb 5 oz (76.346 kg)  10/28/14 170 lb 11.2 oz (77.429 kg)     Other studies Reviewed: Additional studies/ records that were reviewed today include: recent hospital records  ASSESSMENT AND PLAN:  1.  CAD: She is not having any ischemic symptoms. Continue aspirin, Coreg, and statin. Continue Plavix for now, she knows to stop it prior to surgery.  2. Valvular problems: She is aware of the need to follow-up with Dr. Servando Snare and eventually have surgery  3. Tobacco use: She has not smoked and states she wants to but has not had a cigarette since she left the hospital. Reinforce this  4. Chronic diastolic CHF: Her weight is up 3 pounds from her last weight, but consistent with her discharge weight from the hospital. She does not have volume overload by exam. Encouraged compliance with Lasix and a low sodium diet.   Current medicines are reviewed at length with the patient today.  The patient does  not have concerns regarding medicines.  The following changes have been made:  no change  Labs/ tests ordered today include:   Orders Placed This Encounter  Procedures  . EKG 12-Lead     Disposition:   FU as scheduled  Augusto Garbe  11/04/2014 10:20 AM    [facility] [address], [place], [place]  [address] Phone: [phone]; Fax: [phone]

## 2014-11-04 NOTE — Patient Instructions (Signed)
Medication Instructions:   Your physician recommends that you continue on your current medications as directed. Please refer to the Current Medication list given to you today.   Labwork:  NONE ORDER TODAY  Testing/Procedures:  NONE ORDER TODAY  Follow-Up:  ALREADY SCHEDULED NOTED AT THE TOP OF OFFICE VISIT       Any Other Special Instructions Will Be Listed Below (If Applicable).

## 2014-11-07 ENCOUNTER — Other Ambulatory Visit: Payer: Self-pay | Admitting: Cardiothoracic Surgery

## 2014-11-07 DIAGNOSIS — Z951 Presence of aortocoronary bypass graft: Secondary | ICD-10-CM

## 2014-11-10 ENCOUNTER — Other Ambulatory Visit: Payer: Self-pay | Admitting: *Deleted

## 2014-11-10 ENCOUNTER — Ambulatory Visit
Admission: RE | Admit: 2014-11-10 | Discharge: 2014-11-10 | Disposition: A | Discharge status: Home / Self Care | Payer: No Typology Code available for payment source | Source: Ambulatory Visit | Attending: Cardiothoracic Surgery | Admitting: Cardiothoracic Surgery

## 2014-11-10 ENCOUNTER — Ambulatory Visit (INDEPENDENT_AMBULATORY_CARE_PROVIDER_SITE_OTHER): Payer: No Typology Code available for payment source | Admitting: Cardiothoracic Surgery

## 2014-11-10 ENCOUNTER — Encounter: Payer: Self-pay | Admitting: Cardiothoracic Surgery

## 2014-11-10 VITALS — BP 140/79 | HR 66 | Resp 16 | Ht 73.5 in | Wt 170.0 lb

## 2014-11-10 DIAGNOSIS — I351 Nonrheumatic aortic (valve) insufficiency: Secondary | ICD-10-CM

## 2014-11-10 DIAGNOSIS — I2510 Atherosclerotic heart disease of native coronary artery without angina pectoris: Secondary | ICD-10-CM

## 2014-11-10 DIAGNOSIS — I340 Nonrheumatic mitral (valve) insufficiency: Secondary | ICD-10-CM

## 2014-11-10 DIAGNOSIS — Z951 Presence of aortocoronary bypass graft: Secondary | ICD-10-CM

## 2014-11-10 NOTE — Progress Notes (Signed)
[address]       [place] [address]             [phone]                    Leah [name]  Medical Record #[id number] Date of Birth: 1950-05-08  Referring: Burnell Blanks* Primary Care: Kathrine Cords, MD  Chief Complaint:    Chief Complaint  Patient presents with  . Follow-up    post hosp. d/c to schedule CABG/MVR....with a cxr...11 DAYS SMOKE FREE!!!!!    History of Present Illness:   his is a 64 y/o [place] 64 y.o. female is seen in the office  today for  Consideration of mitral and aortic valve replacement and CABG. Patient was recently in hospital. Patient has history   of CAD, (s/p PCI with stenting of the mid LAD in June of 2009 using a 2.5 x 12 mm Promus stent). She has a history of peripheral vascular disease. She has had a left carotid endarterectomy in August of 2009, right external iliac stenting and bilateral renal artery stenting in 2009, and left femoropopliteal bypass grafting by Dr. Kellie Simmering in 2012. In August 2013, she had chest pain. A Myoview study showed mild apical ischemia. According to medical records, she presented to the ER on 08/22/14 with chest pain. Cardiac cath done 08/24/14 revealed distal stent restenosis of 70% that was not felt to be hemodynamically significant and the plan is for medical treatment. She was seen then seen in follow up 09/07/14 and was doing well. An echo done during then showed an EF of 60-65%, with moderate to severe MR, and mild to moderate AR, and grade 2 diastolic dysfunction.  She presented to ER on 10/21/2014 with complaints of mild heaviness in her chest and shortness of breath on 10/21/2014. Her BNP was 1223 and Troponin I was initially 0.08 and slightly increased to 0.11. CXR showed mild cardiomegaly, central pulmonary vascular congestion and mild bilateral interstitial edema, small bilateral pleural effusions, and a spiculated nodular density on the right (about 5 mm)  seen on previous CT was not seen on CXR. TEE done on 10/24/2014 shoed LVEF to 55-60%, moderate AI, severe MR, moderately calcified aorta, and no evidence of thrombus in LA. She is on Plavix since 2009 for coronary stent. .Currently, she has no chest pain and her shortness of breath has improved with diuresis. He only other complaint is orthopnea. Her vital signs show she is afebrile, HR in the 60's, BP 139/68, and oxygen saturation 98% on room air. With medical treatment since admission she feels much better.  Patient denies known rheumatic fever as child, but tee suggests rheumatic valve disease  Patient is a current smoker up until this admission. Her smoking history and family history of her mother dying of lung cancer makes the right upper lung nodule a high risk finding although small lesion.    Patient very proud that she has not been smoking for the last 11 days and continues to stay tobacco free.   Current Activity/ Functional Status:  Patient is independent with mobility/ambulation, transfers, ADL's, IADL's.   Zubrod Score: At the time of surgery this patient's most appropriate activity status/level should be described as: '[]'$     0    Normal activity, no symptoms '[x]'$     1    Restricted in physical strenuous activity but ambulatory, able to do out light work '[]'$   2    Ambulatory and capable of self care, unable to do work activities, up and about               >50 % of waking hours                              '[]'$     3    Only limited self care, in bed greater than 50% of waking hours '[]'$     4    Completely disabled, no self care, confined to bed or chair '[]'$     5    Moribund   Past Medical History  Diagnosis Date  . Hyperlipidemia   . Hypertension   . GERD (gastroesophageal reflux disease)   . Leg pain   . Headache(784.0)   . Carotid artery occlusion   . COPD (chronic obstructive pulmonary disease)   . CAD (coronary artery disease)   . Peripheral vascular disease   .  Tobacco abuse   . Shortness of breath dyspnea   . Renal vascular disease 10/26/2014    bilateral stents placed     Past Surgical History  Procedure Laterality Date  . Carotid endarterectomy  11/21/2007    left  . Angioplasty / stenting iliac  2010    right external iliac by Dr. Irish Lack  . Coronary angioplasty with stent placement  6/09    LAD 2.5x12 Promus  . Femoral-popliteal bypass graft  10/12    left Dr. Kellie Simmering  . Abdominal hysterectomy    . Cardiac catheterization  11/13/11    Left Heart Cath. with Coronary Angiogram  . Cardiac catheterization N/A 08/24/2014    Procedure: Left Heart Cath and Coronary Angiography;  Surgeon: Wellington Hampshire, MD;  Location: [facility];  Service: Cardiovascular;  Laterality: N/A;  . Renal artery stent Bilateral   . Tee without cardioversion N/A 10/24/2014    Procedure: TRANSESOPHAGEAL ECHOCARDIOGRAM (TEE);  Surgeon: Thayer Headings, MD;  Location: [facility];  Service: Cardiovascular;  Laterality: N/A;  . Cardiac catheterization N/A 10/26/2014    Procedure: Right/Left Heart Cath and Coronary Angiography;  Surgeon: Peter M Martinique, MD; oLAD 70%, mLAD 70% ISR, D2 30%, OFC 30%, RCA 20%, EF nl, low R heart pressures after diuresis, severe MR    Family History  Problem Relation Age of Onset  . Cancer Mother     BRAIN AND LUNG  . Hypertension Father   . Heart disease Father   . Heart attack Neg Hx   . Stroke Paternal Aunt     great aunt  . Hypertension Mother   . Hypertension Brother     Social History   Social History  . Marital Status: Divorced    Spouse Name: N/A  . Number of Children: 4  . Years of Education: N/A   Occupational History  .  Lab [employer]   Social History Main Topics  . Smoking status: Former Smoker -- 0.50 packs/day for 35 years    Types: Cigarettes    Quit date: 10/31/2014  . Smokeless tobacco: Never Used     Comment: has not started wellbutrin yet  . Alcohol Use: No  . Drug Use: No  . Sexual Activity: No    Other Topics Concern  . Not on file   Social History Narrative    History  Smoking status  . Former Smoker -- 0.50 packs/day for 35 years  . Types: Cigarettes  . Quit  date: 10/31/2014  Smokeless tobacco  . Never Used    Comment: has not started wellbutrin yet    History  Alcohol Use No     Allergies  Allergen Reactions  . Lisinopril Swelling    Angioedema 06/10/11  . Aspirin Other (See Comments)    Upset stomach  . Chantix [Varenicline]     insomnia  . Penicillins Hives    Current Outpatient Prescriptions  Medication Sig Dispense Refill  . acetaminophen (TYLENOL) 325 MG tablet Take 2 tablets (650 mg total) by mouth every 4 (four) hours as needed for fever, headache or mild pain.    Marland Kitchen albuterol (PROVENTIL HFA;VENTOLIN HFA) 108 (90 BASE) MCG/ACT inhaler Inhale 2 puffs into the lungs every 6 (six) hours as needed for wheezing or shortness of breath. 1 Inhaler 0  . amLODipine (NORVASC) 10 MG tablet Take 1 tablet (10 mg total) by mouth daily. 30 tablet 2  . aspirin EC 81 MG tablet Take 1 tablet (81 mg total) by mouth daily.    Marland Kitchen atorvastatin (LIPITOR) 80 MG tablet Take 1 tablet (80 mg total) by mouth daily. 30 tablet 2  . buPROPion (WELLBUTRIN XL) 150 MG 24 hr tablet Take 1 tablet (150 mg total) by mouth daily. For 3 days, then increase '300mg'$  (2 tablets) daily 60 tablet 0  . carvedilol (COREG) 25 MG tablet Take 1 tablet (25 mg total) by mouth 2 (two) times daily with a meal. ONE TABLET IN AM ONE TABLET IN PM 60 tablet 6  . clopidogrel (PLAVIX) 75 MG tablet Take 1 tablet (75 mg total) by mouth daily. 30 tablet 2  . cyclobenzaprine (FLEXERIL) 10 MG tablet TAKE 1 TABLET BY MOUTH 3 TIMES DAILY AS NEEDED FOR MUSCLE SPASMS 70 tablet 0  . furosemide (LASIX) 40 MG tablet Take 1 tablet (40 mg total) by mouth daily. 30 tablet 6  . HYDROcodone-acetaminophen (NORCO/VICODIN) 5-325 MG per tablet Take 1 tablet by mouth every 6 (six) hours as needed (breakthrough pain). 15 tablet 0  .  nitroGLYCERIN (NITROSTAT) 0.4 MG SL tablet Place 1 tablet (0.4 mg total) under the tongue every 5 (five) minutes x 3 doses as needed for chest pain. 25 tablet 12  . potassium chloride SA (K-DUR,KLOR-CON) 20 MEQ tablet Take 1 tablet (20 mEq total) by mouth daily. 30 tablet 6  . traMADol (ULTRAM) 50 MG tablet Take 1 tablet (50 mg total) by mouth every 8 (eight) hours as needed. 60 tablet 0  . traZODone (DESYREL) 100 MG tablet Take 1 tablet (100 mg total) by mouth at bedtime as needed for sleep. 30 tablet 2  . [DISCONTINUED] simvastatin (ZOCOR) 40 MG tablet Take 40 mg by mouth at bedtime.      No current facility-administered medications for this visit.      Review of Systems:     Cardiac Review of Systems: Y or N  Chest Pain [ y   ]  Resting SOB [ y  ] Exertional SOB  [ y ]  25 [ y ]   Pedal Edema [ y  ]    Palpitations [ y ] Syncope  [ n ]   Presyncope [n   ]  General Review of Systems: [Y] = yes [  ]=no Constitional: recent weight change [  ];  Wt loss over the last 3 months [   ] anorexia [  ]; fatigue Blue.Reese  ]; nausea [  ]; night sweats [  ]; fever [  ]; or chills [  ];  Dental: poor dentition[  ]; Last Dentist visit:   Eye : blurred vision [  ]; diplopia [   ]; vision changes [  ];  Amaurosis fugax[  ]; Resp: cough [  ];  wheezing[  ];  hemoptysis[  ]; shortness of breath[  ]; paroxysmal nocturnal dyspnea[  ]; dyspnea on exertion[  ]; or orthopnea[  ];  GI:  gallstones[  ], vomiting[  ];  dysphagia[  ]; melena[  ];  hematochezia [  ]; heartburn[  ];   Hx of  Colonoscopy[  ]; GU: kidney stones [  ]; hematuria[  ];   dysuria [  ];  nocturia[  ];  history of     obstruction [  ]; urinary frequency [  ]             Skin: rash, swelling[  ];, hair loss[  ];  peripheral edema[  ];  or itching[  ]; Musculosketetal: myalgias[  ];  joint swelling[  ];  joint erythema[  ];  joint pain[  ];  back pain[  ];  Heme/Lymph: bruising[  ];  bleeding[  ];  anemia[  ];  Neuro: TIA[  ];   headaches[  ];  stroke[  ];  vertigo[  ];  seizures[  ];   paresthesias[  ];  difficulty walking[ n ];  Psych:depression[  ]; anxiety[  ];  Endocrine: diabetes[  ];  thyroid dysfunction[  ];  Immunizations: Flu up to date [ ? ]; Pneumococcal up to date [  /];  Other:  Physical Exam: BP 140/79 mmHg  Pulse 66  Resp 16  Ht 6' 1.5" (1.867 m)  Wt 170 lb (77.111 kg)  BMI 22.12 kg/m2  SpO2 98%  PHYSICAL EXAMINATION: General appearance: alert, cooperative and appears stated age Head: Normocephalic, without obvious abnormality, atraumatic Neck: no adenopathy, no carotid bruit, no JVD, supple, symmetrical, trachea midline and thyroid not enlarged, symmetric, no tenderness/mass/nodules Lymph nodes: Cervical, supraclavicular, and axillary nodes normal. Resp: clear to auscultation bilaterally Back: symmetric, no curvature. ROM normal. No CVA tenderness. Cardio: regular rate and rhythm and systolic murmur: late systolic 3/6, blowing at 2nd right intercostal space GI: soft, non-tender; bowel sounds normal; no masses,  no organomegaly Genitalia: defer exam Extremities: extremities normal, atraumatic, no cyanosis or edema and Homans sign is negative, no sign of DVT Neurologic: Grossly normal Incision and left leg from previous femoral popliteal bypass  Diagnostic Studies & Laboratory data:     Recent Radiology Findings:   Dg Chest 2 View  11/10/2014   CLINICAL DATA:  Coronary artery disease.  EXAM: CHEST  2 VIEW  COMPARISON:  10/23/2014 and 729 1,016  FINDINGS: Heart size and pulmonary vascularity are normal and the lungs are clear. Pleural effusions present on the prior exam have resolved. No osseous abnormality.  IMPRESSION: No active cardiopulmonary disease.   Electronically Signed   By: Lorriane Shire M.D.   On: 11/10/2014 14:19   Dg Chest 2 View  10/21/2014   CLINICAL DATA:  Slight cough, nausea, dizziness and shortness of breath a started yesterday. No fever.  EXAM: CHEST  2 VIEW   COMPARISON:  Chest x-rays dated 08/22/2014 and 10/10/2012. Chest CT dated 08/25/2014.  FINDINGS: Mild cardiomegaly is unchanged. Overall cardiomediastinal silhouette is stable in size and configuration. There is mild central pulmonary vascular congestion and mild bilateral interstitial edema, basilar predominant, suggesting congestive heart failure. There are small bilateral pleural effusions which are new, with probable adjacent atelectasis.  Mild scarring/fibrosis  is seen at each lung apex. The spiculated nodular density identified at the right lung apex on the previous chest CT, measuring 5 mm, is not able to be visualized on this chest x-ray.  IMPRESSION: 1. Cardiomegaly with central pulmonary vascular congestion and bilateral interstitial edema suggesting congestive heart failure/volume overload. 2. Small bilateral pleural effusions, with probable adjacent atelectasis. 3. The 5 mm right apical nodule described on previous chest CT of 08/25/2014 is not able to be visualized on this chest x-ray. Please note that a follow-up chest CT in 6-12 months was recommended on that previous report.   Electronically Signed   By: Franki Cabot M.D.   On: 10/21/2014 12:05   Ct Chest Without Contrast  10/27/2014   CLINICAL DATA:  Re-evaluate right upper lobe pulmonary nodule, prior to possible valve surgery. Initial encounter.  EXAM: CT CHEST WITHOUT CONTRAST  TECHNIQUE: Multidetector CT imaging of the chest was performed following the standard protocol without IV contrast.  COMPARISON:  CT of the chest performed 08/25/2014  FINDINGS: The small slightly spiculated nodule at the right lung apex is again seen; there is a small associated cystic component which appears to have increased mildly in size, while the solid component has decreased slightly in size, measuring approximately 5 mm. Given the slight apparent interval decrease in size of the solid component from the recent prior study, this is thought more likely to be  postinfectious in nature, though malignancy cannot be entirely excluded.  No additional pulmonary nodules are seen. The lungs are otherwise clear, aside from mild scarring or atelectasis at the left lung base. No pleural effusion or pneumothorax is seen.  Scattered coronary artery calcifications are noted. The mediastinum is otherwise unremarkable. No mediastinal lymphadenopathy is seen. No pericardial effusion is identified. The great vessels are grossly unremarkable, aside from scattered calcification. Mild calcification is noted along the thoracic aorta. The visualized portions of the thyroid gland are unremarkable. No axillary lymphadenopathy is appreciated.  No acute osseous abnormalities are identified.  IMPRESSION: 1. Small slightly spiculated nodule at the right lung apex is again seen. It has a small cystic component which appears to have increased mildly in size, while the solid component has decreased slightly in size, measuring approximately 5 mm. Given the slight apparent interval decrease in size of the solid component from the recent prior CT, this is thought more likely to be postinfectious in nature, though malignancy cannot be entirely excluded. As this remains too small to further characterize, would recommend strict follow-up CT of the chest in 3-6 months, to ensure stability. 2. Mild scarring or atelectasis at the left lung base. 3. Scattered coronary artery calcifications seen.   Electronically Signed   By: Garald Balding M.D.   On: 10/27/2014 19:40   Dg Chest Port 1 View  10/23/2014   CLINICAL DATA:  CHF  EXAM: PORTABLE CHEST - 1 VIEW  COMPARISON:  10/21/2014  FINDINGS: Hyperinflation of the lungs. Mild cardiomegaly. Right base airspace opacity slightly improved, likely residual atelectasis. Small right pleural effusion.  IMPRESSION: Improving right basilar aeration with continued right basilar atelectasis and small right effusion.   Electronically Signed   By: Rolm Baptise M.D.   On:  10/23/2014 09:13     I have independently reviewed the above radiologic studies TEE: Study Conclusions  - Left ventricle: Systolic function was normal. The estimated ejection fraction was in the range of 55% to 60%. - Aortic valve: There was moderate regurgitation. - Mitral valve: There was severe regurgitation. -  Left atrium: No evidence of thrombus in the atrial cavity or appendage.  Diagnostic transesophageal echocardiography. 2D and color Doppler. Birthdate: Patient birthdate: 1951-03-01. Age: Patient is 64 yr old. Sex: Gender: female.  BMI: 22.4 kg/m^2. Blood pressure: 159/66 Patient status: Inpatient. Study date: Study date: 10/24/2014. Study time: 10:03 AM. Location: Endoscopy.  -------------------------------------------------------------------  ------------------------------------------------------------------- Left ventricle: Systolic function was normal. The estimated ejection fraction was in the range of 55% to 60%.  ------------------------------------------------------------------- Aortic valve:  The valve appears to be grossly normal.  Doppler: There was moderate regurgitation.  ------------------------------------------------------------------- Aorta: There is moderate atheroma in the thoracic aorta. The aorta was moderately calcified.  ------------------------------------------------------------------- Mitral valve:  The valve appears to be grossly normal.  Doppler: There was severe regurgitation.  ------------------------------------------------------------------- Left atrium: The atrium was normal in size. No evidence of thrombus in the atrial cavity or appendage.  ------------------------------------------------------------------- Post procedure conclusions Ascending Aorta:  - There is moderate atheroma in the thoracic aorta. The aorta was moderately calcified.   Cardiac Cath: Dominance: Right   Left Main    The vessel is angiographically normal.     Left Anterior Descending   . Ost LAD lesion, 70% stenosed. discrete .   Marland Kitchen Mid LAD to Dist LAD lesion, 70% stenosed. located at the bend . The lesion was previously treated with a drug-eluting stent greater than two years ago.   . First Diagonal Branch   There is mild in the vessel.   . Second Diagonal Branch   . 2nd Diag lesion, 30% stenosed.   . Third Diagonal Branch   The vessel is small in size.     Left Circumflex  There is mild the vessel.   Colon Flattery Cx lesion, 30% stenosed.   . Mid Cx lesion, 30% stenosed.   . First Obtuse Marginal Branch   The vessel is small in size.   Marland Kitchen Second Obtuse Marginal Branch   The vessel is small in size.   . Third Obtuse Marginal Branch   There is mild in the vessel.     Right Coronary Artery   . Mid RCA lesion, 20% stenosed.   . Right Posterior Descending Artery   There is mild in the vessel.   . Right Posterior Atrioventricular Branch   The vessel exhibits minimal luminal irregularities.   . First Right Posterolateral   The vessel exhibits minimal luminal irregularities.   . Second Right Posterolateral   The vessel exhibits minimal luminal irregularities.       Right Heart Pressures LV EDP is normal. Initially filling pressures were very low due to aggressive diuresis. In the lab the patient received a 250 cc bolus of IV fluid as well as approximately 80 cc of contrast. With fluid challenge PCWP increased from 5/4 to 17/15 mm Hg and PA pressure increased from 22/6 mm Hg to 31/8 mm Hg. Right heart pressures are normal.     PFT's 10/2014 FEV1 2.18  77%  DLCO 11.38 32% The diffusion defect and reduced lung volumes suggest an early parenchymal process. Minimal airway obstruction is present. In view of the severity of the diffusion defect, studies with exercise would be helpful to evaluate the presence of hypoxemia. Pulmonary Function Diagnosis: Minimal Obstructive Airways  Disease Insignificant response to bronchodilator Mild Restriction -Parenchymal Severe Diffusion Defect Recent Lab Findings: Lab Results  Component Value Date   WBC 9.9 10/26/2014   HGB 15.0 10/26/2014   HCT 46.0 10/26/2014   PLT 190 10/26/2014   GLUCOSE 91 10/28/2014  CHOL 181 08/24/2014   TRIG 88 08/24/2014   HDL 33* 08/24/2014   LDLDIRECT 185* 09/09/2011   LDLCALC 130* 08/24/2014   ALT 9 02/28/2014   AST 14 02/28/2014   NA 133* 10/28/2014   K 4.3 10/28/2014   CL 99* 10/28/2014   CREATININE 1.14* 10/28/2014   BUN 21* 10/28/2014   CO2 25 10/28/2014   TSH 0.378 02/28/2014   INR 1.09 10/26/2014   HGBA1C 5.7 02/28/2014      Assessment / Plan:   Severe mitral insufficiency with scarred mitral valve suggestive of rheumatic fever, at least moderate aortic insufficiency and recurrent coronary artery disease. I recommended to the patient that we proceed with aortic and mitral valve replacement and coronary artery bypass grafting. The risks and options of surgery have been discussed with the patient today in the office and also while she was in the hospital. We discussed use of mechanical versus tissue valves and she prefers tissue valve. Tablet plan to proceed with surgery on September 6.      [name] MD      [address] [place] [address] Office [phone]   Beeper [phone]  11/10/2014 2:43 PM

## 2014-11-10 NOTE — Patient Instructions (Signed)
Stop Plavix aug 31 Wednesday   Mitral Valve Replacement Mitral valve replacement is surgical replacement of the mitral valve. Many mitral valves can be repaired, especially if they leak from wearing out. When the valve is too damaged to repair, the valve must be replaced. Valves damaged by rheumatic disease often must be replaced. An artificial (prosthetic) valve is used to do this. Three types of prosthetic valves are available:  Mechanical valves made entirely from man-made materials.   Donor valves made from human donors. These are only used in special situations.   Biological valves made from animal tissues.  LET [facility] CARE PROVIDER KNOW ABOUT:  Any allergies you have.   All medicines you are taking, including vitamins, herbs, eye drops, creams, and over-the-counter medicines.   Previous problems you or members of your family have had with the use of anesthetics.   Any blood disorders you have.   Previous surgeries you have had.   Medical conditions you have.  RISKS AND COMPLICATIONS Generally, mitral valve replacement is a safe procedure. However, problems can occur and include:   Blood clotting caused by the new valve. Replacement with a mechanical valve requires lifelong treatment with medicine to prevent blood clots.  Infection in the new valve. Infection is more common with valve replacement than with valve repair.   Valve failure. Valve failure is more common with valve replacement than with valve repair. Pig heart valves tend to fail after about 8 to 10 years.  Effects from the surgery itself, such as bleeding, infection, and risks of anesthesia. These risks are rare. BEFORE THE PROCEDURE  You may need to have blood tests, a test to check heart rhythm (electrocardiogram), or echocardiogram to evaluate your heart valves and the blood flow through them.  Ask your health care provider about:  Changing or stopping your regular medicines. This is  especially important if you are taking diabetes medicines or blood thinners.  Taking medicines such as aspirin and ibuprofen. These medicines can thin your blood. Do not take these medicines before your procedure if your health care provider asks you not to.  Do not eat or drink anything after midnight on the night before the procedure or as directed by your health care provider. Ask your health care provider if you can take a sip of water with any approved medicines the morning of the procedure.  Do not smoke for as long as possible before the surgery. Smoking can increase the chances of a healing problem after surgery. PROCEDURE  There are two types of mitral valve replacement surgeries:   Traditional mitral valve replacement surgery.  You will be given medicine that makes you fall asleep (general anesthetic).  You will then be placed on a heart-lung bypass machine. This machine provides oxygen to your blood while the heart is undergoing surgery.  During surgery, the surgeon will make a large cut (incision) in the chest. Sometimes the heart will be cooled to slow or stop the heartbeat.  The damaged mitral valve will be removed and replaced with a prosthetic heart valve.  After the replacement valve is sewn in, the sac around the heart and the chest will be closed.   Minimally invasive mitral valve replacement surgery.  This is done through a smaller incision. The chest is not opened as in traditional surgery.  If your condition allows for this procedure, there will often be less blood loss, less pain, and a faster recovery compared to traditional surgery. AFTER THE PROCEDURE Recovery from  heart valve surgery usually involves a few days in the intensive care unit (ICU) of a hospital.  Document Released: 07/12/2004 Document Revised: 07/26/2013 Document Reviewed: 08/12/2012 [facility] Patient Information 2015 [place]. This information is not intended to replace advice given to you  by your health care provider. Make sure you discuss any questions you have with your health care provider. Aortic Valve Replacement  Aortic valve replacement is a procedure to replace an aortic valve that cannot be repaired. An artificial (prosthetic) valve is used to do this. Three types of prosthetic valves are available:   Mechanical valves made entirely from man-made materials.   Donor valves made from human donors. These are only used in special situations.   Biological valves made from animal tissues.  The type of prosthetic valve used will be determined based on various factors, including your age, your lifestyle, and other medical conditions you have.  LET [facility] CARE PROVIDER KNOW ABOUT:  Any allergies you have.  All medicines you are taking, including vitamins, herbs, eye drops, creams, and over-the-counter medicines.  Previous problems you or members of your family have had with the use of anesthetics.  Any blood disorders you have.  Previous surgeries you have had.  Medical conditions you have. RISKS AND COMPLICATIONS Generally, this is a safe procedure. However, as with any procedure, problems can occur. Possible problems include:   Blood clotting caused by the new valve. Replacement with a mechanical valve requires lifelong treatment with medicine to prevent blood clots.   Infection in the new valve.   Valve failure.   Bleeding.   Reaction to anesthetics.  BEFORE THE PROCEDURE  Ask your health care provider about:  Changing or stopping your regular medicines. This is especially important if you are taking diabetes medicines or blood thinners.  Taking medicines such as aspirin and ibuprofen. These medicines can thin your blood. Do not take these medicines before your procedure if your health care provider asks you not to.  Do not eat or drink anything after midnight on the night before the procedure or as directed by your health care  provider. PROCEDURE  The surgeon may use either an open technique or a minimally invasive technique for this surgery:  Traditional open surgery  You will be given a medicine that makes you fall asleep (general anesthetic).  You will then be placed on a heart-lung bypass machine. This machine provides oxygen to your blood while the heart is undergoing surgery.  During surgery, the surgeon will make a large cut (incision) in the chest.  The heart will be cooled to slow or stop the heartbeat.  The damaged aortic valve will be removed and replaced with a prosthetic heart valve.  The incision will then be closed with staples or stitches. Minimally invasive surgery This is done through a smaller incision. This still requires general anesthetic and the heart-lung bypass machine. Your heart will be cooled to slow or stop the heartbeat, allowing the damaged valve to be removed and replaced with the new valve. The smaller incision will then be closed. If your condition allows for this procedure, there is often less blood loss, less pain, and faster recovery compared to traditional open surgery.  AFTER THE PROCEDURE You will be monitored closely in a recovery area. From there, you will likely go to an intensive care unit.  Document Released: 07/31/2004 Document Revised: 07/26/2013 Document Reviewed: 08/18/2012 [facility] Patient Information 2015 [place], [place]. This information is not intended to replace advice given to  you by your health care provider. Make sure you discuss any questions you have with your health care provider.  Aortic Valve Replacement, Care After Refer to this sheet in the next few weeks. These instructions provide you with information on caring for yourself after your procedure. Your health care provider may also give you specific instructions. Your treatment has been planned according to current medical practices, but problems sometimes occur. Call your health care provider if you  have any problems or questions after your procedure. HOME CARE INSTRUCTIONS   Take medicines only as directed by your health care provider.  If your health care provider has prescribed elastic stockings, wear them as directed.  Take frequent naps or rest often throughout the day.  Avoid lifting over 10 lbs (4.5 kg) or pushing or pulling things with your arms for 6-8 weeks or as directed by your health care provider.  Avoid driving or airplane travel for 4-6 weeks after surgery or as directed by your health care provider. If you are riding in a car for an extended period, stop every 1-2 hours to stretch your legs. Keep a record of your medicines and medical history with you when traveling.  Do not drive or operate heavy machinery while taking pain medicine. (narcotics).  Do not cross your legs.  Do not use any tobacco products including cigarettes, chewing tobacco, or electronic cigarettes. If you need help quitting, ask your health care provider.  Do not take baths, swim, or use a hot tub until your health care provider approves. Take showers once your health care provider approves. Pat incisions dry. Do not rub incisions with a washcloth or towel.  Avoid climbing stairs and using the handrail to pull yourself up for the first 2-3 weeks after surgery.  Return to work as directed by your health care provider.  Drink enough fluid to keep your urine clear or pale yellow.  Do not strain to have a bowel movement. Eat high-fiber foods if you become constipated. You may also take a medicine to help you have a bowel movement (laxative) as directed by your health care provider.  Resume sexual activity as directed by your health care provider. Men should not use medicines for erectile dysfunction until their doctor says it isokay.  If you had a certain type of heart condition in the past, you may need to take antibiotic medicine before having dental work or surgery. Let your dentist and health  care providers know if you had one or more of the following:  Previous endocarditis.  An artificial (prosthetic) heart valve.  Congenital heart disease. SEEK MEDICAL CARE IF:  You develop a skin rash.   You experience sudden changes in your weight.  You have a fever. SEEK IMMEDIATE MEDICAL CARE IF:   You develop chest pain that is not coming from your incision.  You have drainage (pus), redness, swelling, or pain at your incision site.   You develop shortness of breath or have difficulty breathing.   You have increased bleeding from your incision site.   You develop light-headedness.  MAKE SURE YOU:   Understand these directions.  Will watch your condition.  Will get help right away if you are not doing well or get worse. Document Released: 09/27/2004 Document Revised: 07/26/2013 Document Reviewed: 12/24/2011 [facility] Patient Information 2015 [place], [place]. This information is not intended to replace advice given to you by your health care provider. Make sure you discuss any questions you have with your health care provider.

## 2014-11-17 ENCOUNTER — Telehealth: Payer: Self-pay | Admitting: Family Medicine

## 2014-11-17 DIAGNOSIS — M545 Low back pain, unspecified: Secondary | ICD-10-CM

## 2014-11-17 NOTE — Telephone Encounter (Signed)
Needs refills on flexerill, tramadol and hydrocodone Please call when ready for pickup

## 2014-11-18 ENCOUNTER — Telehealth: Payer: Self-pay | Admitting: *Deleted

## 2014-11-18 MED ORDER — TRAMADOL HCL 50 MG PO TABS: 50.0000 mg | ORAL_TABLET | Freq: Three times a day (TID) | ORAL | Status: DC | PRN

## 2014-11-18 MED ORDER — CYCLOBENZAPRINE HCL 10 MG PO TABS: ORAL_TABLET | ORAL | Status: DC

## 2014-11-18 NOTE — Telephone Encounter (Signed)
Refills made on tramadol and Flexeril as I have seen her in the last 30 days. No refill on hydrocodone as we were titrating that down. Please let the pt know that the Rxs are at the front desk.  Thanks, Archie Patten, MD [facility] Resident  11/18/2014, 8:14 AM

## 2014-11-18 NOTE — Telephone Encounter (Signed)
Pt informed that her rx are upfront for her to pick up. Also informed our office closes at 3. Deseree Kennon Holter, CMA

## 2014-11-22 ENCOUNTER — Other Ambulatory Visit: Payer: Self-pay | Admitting: Family Medicine

## 2014-11-22 MED ORDER — FUROSEMIDE 40 MG PO TABS: 40.0000 mg | ORAL_TABLET | Freq: Every day | ORAL | Status: DC

## 2014-11-22 NOTE — Telephone Encounter (Signed)
Needs refill on flurosemide [facility]

## 2014-11-25 ENCOUNTER — Encounter (HOSPITAL_COMMUNITY)
Admission: RE | Admit: 2014-11-25 | Discharge: 2014-11-25 | Disposition: A | Discharge status: Home / Self Care | Payer: Medicaid Other | Source: Ambulatory Visit | Attending: Cardiothoracic Surgery | Admitting: Cardiothoracic Surgery

## 2014-11-25 ENCOUNTER — Encounter (HOSPITAL_COMMUNITY): Payer: Self-pay

## 2014-11-25 ENCOUNTER — Ambulatory Visit (HOSPITAL_COMMUNITY)
Admission: RE | Admit: 2014-11-25 | Discharge: 2014-11-25 | Disposition: A | Discharge status: Home / Self Care | Payer: Medicaid Other | Source: Ambulatory Visit | Attending: Cardiothoracic Surgery | Admitting: Cardiothoracic Surgery

## 2014-11-25 VITALS — BP 120/63 | HR 54 | Temp 97.5°F | Resp 18 | Ht 73.5 in | Wt 169.8 lb

## 2014-11-25 DIAGNOSIS — Z01818 Encounter for other preprocedural examination: Secondary | ICD-10-CM | POA: Diagnosis not present

## 2014-11-25 DIAGNOSIS — Z01812 Encounter for preprocedural laboratory examination: Secondary | ICD-10-CM | POA: Diagnosis not present

## 2014-11-25 DIAGNOSIS — I2510 Atherosclerotic heart disease of native coronary artery without angina pectoris: Secondary | ICD-10-CM

## 2014-11-25 DIAGNOSIS — I351 Nonrheumatic aortic (valve) insufficiency: Secondary | ICD-10-CM

## 2014-11-25 DIAGNOSIS — Z0183 Encounter for blood typing: Secondary | ICD-10-CM | POA: Insufficient documentation

## 2014-11-25 DIAGNOSIS — J449 Chronic obstructive pulmonary disease, unspecified: Secondary | ICD-10-CM | POA: Insufficient documentation

## 2014-11-25 DIAGNOSIS — Z87891 Personal history of nicotine dependence: Secondary | ICD-10-CM | POA: Diagnosis not present

## 2014-11-25 DIAGNOSIS — I739 Peripheral vascular disease, unspecified: Secondary | ICD-10-CM | POA: Insufficient documentation

## 2014-11-25 DIAGNOSIS — E785 Hyperlipidemia, unspecified: Secondary | ICD-10-CM | POA: Diagnosis not present

## 2014-11-25 DIAGNOSIS — I10 Essential (primary) hypertension: Secondary | ICD-10-CM | POA: Diagnosis not present

## 2014-11-25 DIAGNOSIS — I340 Nonrheumatic mitral (valve) insufficiency: Secondary | ICD-10-CM

## 2014-11-25 HISTORY — DX: Anemia, unspecified: D64.9

## 2014-11-25 LAB — COMPREHENSIVE METABOLIC PANEL
ALT: 12 U/L — ABNORMAL LOW (ref 14–54)
AST: 15 U/L (ref 15–41)
Albumin: 4 g/dL (ref 3.5–5.0)
Alkaline Phosphatase: 133 U/L — ABNORMAL HIGH (ref 38–126)
Anion gap: 10 (ref 5–15)
BUN: 14 mg/dL (ref 6–20)
CO2: 24 mmol/L (ref 22–32)
Calcium: 9.6 mg/dL (ref 8.9–10.3)
Chloride: 101 mmol/L (ref 101–111)
Creatinine, Ser: 1.19 mg/dL — ABNORMAL HIGH (ref 0.44–1.00)
GFR calc Af Amer: 56 mL/min — ABNORMAL LOW (ref >60)
GFR calc non Af Amer: 48 mL/min — ABNORMAL LOW (ref >60)
Glucose, Bld: 111 mg/dL — ABNORMAL HIGH (ref 65–99)
Potassium: 4.4 mmol/L (ref 3.5–5.1)
Sodium: 135 mmol/L (ref 135–145)
Total Bilirubin: 0.7 mg/dL (ref 0.3–1.2)
Total Protein: 7.6 g/dL (ref 6.5–8.1)

## 2014-11-25 LAB — BLOOD GAS, ARTERIAL
Acid-Base Excess: 0.4 mmol/L (ref 0.0–2.0)
Bicarbonate: 23.9 mEq/L (ref 20.0–24.0)
Drawn by: 421801
FIO2: 0.21
O2 Saturation: 98.2 %
Patient temperature: 98.6
TCO2: 25 mmol/L (ref 0–100)
pCO2 arterial: 34.6 mmHg — ABNORMAL LOW (ref 35.0–45.0)
pH, Arterial: 7.454 — ABNORMAL HIGH (ref 7.350–7.450)
pO2, Arterial: 107 mmHg — ABNORMAL HIGH (ref 80.0–100.0)

## 2014-11-25 LAB — CBC
HCT: 40.6 % (ref 36.0–46.0)
Hemoglobin: 13.3 g/dL (ref 12.0–15.0)
MCH: 26.1 pg (ref 26.0–34.0)
MCHC: 32.8 g/dL (ref 30.0–36.0)
MCV: 79.8 fL (ref 78.0–100.0)
Platelets: 136 10*3/uL — ABNORMAL LOW (ref 150–400)
RBC: 5.09 MIL/uL (ref 3.87–5.11)
RDW: 15 % (ref 11.5–15.5)
WBC: 11.8 10*3/uL — ABNORMAL HIGH (ref 4.0–10.5)

## 2014-11-25 LAB — APTT: aPTT: 30 seconds (ref 24–37)

## 2014-11-25 LAB — PROTIME-INR
INR: 1.06 (ref 0.00–1.49)
Prothrombin Time: 14 seconds (ref 11.6–15.2)

## 2014-11-25 NOTE — Pre-Procedure Instructions (Signed)
Ilianna B[facility]  11/25/2014      [id number] - [place] ([place]), [place] - [address] [place] [address] Phone: [phone] Fax: [phone]  [facility] - , [place] [address] Phone: [phone] Fax: [phone]  [facility] [place] [address] Phone: [phone] Fax: [phone]  [place], [place] - [address] [place] [address] Phone: [phone] Fax: [phone]    Your procedure is scheduled on Tuesday, Sept. 6th   Report to [place] at 5:30 AM   Call this number if you have problems the morning of surgery:  [phone]   Remember:  Do not eat food or drink liquids after midnight Monday.  Take these medicines the morning of surgery with A SIP OF WATER : Norvasc, Wellbutrin, Carvedilol.  Please use your inhaler the morning of surgery.   Do not wear jewelry, make-up or nail polish.  Do not wear lotions, powders, or perfumes.  You may NOT  wear deodorant the day of surgery.  Do not shave 48 hours prior to surgery.     Do not bring valuables to the hospital.  [facility] is not responsible for any belongings or valuables.  Contacts, dentures or bridgework may not be worn into surgery.  Leave your suitcase in the car.  After surgery it may be brought to your room. For patients admitted to the hospital, discharge time will be determined by your treatment team.    Name and phone number of your driver:     Special instructions:  "Preparing for Surgery" instruction sheet.  Please read over the following fact sheets that you were given. Pain Booklet, Coughing and Deep Breathing, Blood Transfusion Information, MRSA Information and Surgical Site Infection  Prevention

## 2014-11-25 NOTE — Progress Notes (Addendum)
She has been instructed to stop the plavix & aspirin August 31st. She was unable to get urine specimen today.  She has taken the cup home and will bring back DOS Spoke with [facility] concerning PCR specimen.  Test came up with 'error' so sample was sent to [facility] for further testing.  Turn a around time is 24 hrs.

## 2014-11-26 LAB — HEMOGLOBIN A1C
Hgb A1c MFr Bld: 6.1 % — ABNORMAL HIGH (ref 4.8–5.6)
Mean Plasma Glucose: 128 mg/dL

## 2014-11-27 LAB — MRSA CULTURE

## 2014-11-28 ENCOUNTER — Encounter (HOSPITAL_COMMUNITY): Payer: Self-pay | Admitting: Emergency Medicine

## 2014-11-28 ENCOUNTER — Emergency Department (HOSPITAL_COMMUNITY): Payer: Medicaid Other

## 2014-11-28 ENCOUNTER — Encounter (HOSPITAL_COMMUNITY): Payer: Self-pay | Admitting: Anesthesiology

## 2014-11-28 ENCOUNTER — Other Ambulatory Visit: Payer: Self-pay

## 2014-11-28 ENCOUNTER — Inpatient Hospital Stay (HOSPITAL_COMMUNITY)
Admission: EM | Admit: 2014-11-28 | Discharge: 2014-11-30 | DRG: 948 | Disposition: A | Discharge status: Home / Self Care | Payer: Medicaid Other | Attending: Family Medicine | Admitting: Family Medicine

## 2014-11-28 ENCOUNTER — Inpatient Hospital Stay (HOSPITAL_COMMUNITY): Payer: Medicaid Other

## 2014-11-28 DIAGNOSIS — Z955 Presence of coronary angioplasty implant and graft: Secondary | ICD-10-CM

## 2014-11-28 DIAGNOSIS — R531 Weakness: Secondary | ICD-10-CM | POA: Diagnosis present

## 2014-11-28 DIAGNOSIS — I080 Rheumatic disorders of both mitral and aortic valves: Secondary | ICD-10-CM | POA: Diagnosis present

## 2014-11-28 DIAGNOSIS — R410 Disorientation, unspecified: Principal | ICD-10-CM | POA: Insufficient documentation

## 2014-11-28 DIAGNOSIS — J449 Chronic obstructive pulmonary disease, unspecified: Secondary | ICD-10-CM | POA: Diagnosis present

## 2014-11-28 DIAGNOSIS — I351 Nonrheumatic aortic (valve) insufficiency: Secondary | ICD-10-CM

## 2014-11-28 DIAGNOSIS — E785 Hyperlipidemia, unspecified: Secondary | ICD-10-CM | POA: Diagnosis present

## 2014-11-28 DIAGNOSIS — R911 Solitary pulmonary nodule: Secondary | ICD-10-CM | POA: Diagnosis present

## 2014-11-28 DIAGNOSIS — Z95820 Peripheral vascular angioplasty status with implants and grafts: Secondary | ICD-10-CM | POA: Diagnosis not present

## 2014-11-28 DIAGNOSIS — D649 Anemia, unspecified: Secondary | ICD-10-CM | POA: Diagnosis present

## 2014-11-28 DIAGNOSIS — Z809 Family history of malignant neoplasm, unspecified: Secondary | ICD-10-CM

## 2014-11-28 DIAGNOSIS — I739 Peripheral vascular disease, unspecified: Secondary | ICD-10-CM | POA: Diagnosis present

## 2014-11-28 DIAGNOSIS — R4781 Slurred speech: Secondary | ICD-10-CM | POA: Insufficient documentation

## 2014-11-28 DIAGNOSIS — T43295A Adverse effect of other antidepressants, initial encounter: Secondary | ICD-10-CM | POA: Diagnosis present

## 2014-11-28 DIAGNOSIS — Z7982 Long term (current) use of aspirin: Secondary | ICD-10-CM

## 2014-11-28 DIAGNOSIS — Z87891 Personal history of nicotine dependence: Secondary | ICD-10-CM | POA: Diagnosis not present

## 2014-11-28 DIAGNOSIS — Z79899 Other long term (current) drug therapy: Secondary | ICD-10-CM

## 2014-11-28 DIAGNOSIS — I2510 Atherosclerotic heart disease of native coronary artery without angina pectoris: Secondary | ICD-10-CM | POA: Diagnosis present

## 2014-11-28 DIAGNOSIS — I10 Essential (primary) hypertension: Secondary | ICD-10-CM | POA: Insufficient documentation

## 2014-11-28 DIAGNOSIS — I5032 Chronic diastolic (congestive) heart failure: Secondary | ICD-10-CM | POA: Diagnosis present

## 2014-11-28 DIAGNOSIS — I25110 Atherosclerotic heart disease of native coronary artery with unstable angina pectoris: Secondary | ICD-10-CM

## 2014-11-28 HISTORY — DX: Nonrheumatic mitral (valve) insufficiency: I34.0

## 2014-11-28 LAB — BASIC METABOLIC PANEL
ANION GAP: 11 (ref 5–15)
BUN: 19 mg/dL (ref 6–20)
CALCIUM: 9.6 mg/dL (ref 8.9–10.3)
CO2: 28 mmol/L (ref 22–32)
CREATININE: 1.27 mg/dL — AB (ref 0.44–1.00)
Chloride: 94 mmol/L — ABNORMAL LOW (ref 101–111)
GFR, EST AFRICAN AMERICAN: 52 mL/min — AB (ref >60)
GFR, EST NON AFRICAN AMERICAN: 45 mL/min — AB (ref >60)
Glucose, Bld: 98 mg/dL (ref 65–99)
Potassium: 3.7 mmol/L (ref 3.5–5.1)
SODIUM: 133 mmol/L — AB (ref 135–145)

## 2014-11-28 LAB — URINE MICROSCOPIC-ADD ON

## 2014-11-28 LAB — TSH: TSH: 1.756 u[IU]/mL (ref 0.350–4.500)

## 2014-11-28 LAB — COMPREHENSIVE METABOLIC PANEL
ALK PHOS: 130 U/L — AB (ref 38–126)
ALT: 12 U/L — ABNORMAL LOW (ref 14–54)
ANION GAP: 9 (ref 5–15)
AST: 16 U/L (ref 15–41)
Albumin: 4 g/dL (ref 3.5–5.0)
BUN: 18 mg/dL (ref 6–20)
CALCIUM: 9.7 mg/dL (ref 8.9–10.3)
CO2: 28 mmol/L (ref 22–32)
Chloride: 99 mmol/L — ABNORMAL LOW (ref 101–111)
Creatinine, Ser: 1.13 mg/dL — ABNORMAL HIGH (ref 0.44–1.00)
GFR calc non Af Amer: 51 mL/min — ABNORMAL LOW (ref >60)
GFR, EST AFRICAN AMERICAN: 60 mL/min — AB (ref >60)
Glucose, Bld: 98 mg/dL (ref 65–99)
POTASSIUM: 3.7 mmol/L (ref 3.5–5.1)
SODIUM: 136 mmol/L (ref 135–145)
TOTAL PROTEIN: 7.2 g/dL (ref 6.5–8.1)
Total Bilirubin: 0.6 mg/dL (ref 0.3–1.2)

## 2014-11-28 LAB — URINALYSIS, ROUTINE W REFLEX MICROSCOPIC
Bilirubin Urine: NEGATIVE
GLUCOSE, UA: NEGATIVE mg/dL
KETONES UR: NEGATIVE mg/dL
LEUKOCYTES UA: NEGATIVE
Nitrite: NEGATIVE
PROTEIN: NEGATIVE mg/dL
Specific Gravity, Urine: 1.01 (ref 1.005–1.030)
UROBILINOGEN UA: 1 mg/dL (ref 0.0–1.0)
pH: 6.5 (ref 5.0–8.0)

## 2014-11-28 LAB — CBC WITH DIFFERENTIAL/PLATELET
BASOS ABS: 0 10*3/uL (ref 0.0–0.1)
BASOS PCT: 0 % (ref 0–1)
EOS ABS: 0.5 10*3/uL (ref 0.0–0.7)
Eosinophils Relative: 4 % (ref 0–5)
HEMATOCRIT: 39.1 % (ref 36.0–46.0)
HEMOGLOBIN: 12.8 g/dL (ref 12.0–15.0)
Lymphocytes Relative: 21 % (ref 12–46)
Lymphs Abs: 2.4 10*3/uL (ref 0.7–4.0)
MCH: 25.8 pg — ABNORMAL LOW (ref 26.0–34.0)
MCHC: 32.7 g/dL (ref 30.0–36.0)
MCV: 78.7 fL (ref 78.0–100.0)
Monocytes Absolute: 0.9 10*3/uL (ref 0.1–1.0)
Monocytes Relative: 8 % (ref 3–12)
NEUTROS ABS: 7.5 10*3/uL (ref 1.7–7.7)
NEUTROS PCT: 67 % (ref 43–77)
Platelets: 122 10*3/uL — ABNORMAL LOW (ref 150–400)
RBC: 4.97 MIL/uL (ref 3.87–5.11)
RDW: 15 % (ref 11.5–15.5)
WBC: 11.2 10*3/uL — AB (ref 4.0–10.5)

## 2014-11-28 LAB — AMMONIA: Ammonia: 17 umol/L (ref 9–35)

## 2014-11-28 LAB — VITAMIN B12: VITAMIN B 12: 498 pg/mL (ref 180–914)

## 2014-11-28 LAB — FOLATE: FOLATE: 6.5 ng/mL (ref >5.9)

## 2014-11-28 LAB — BRAIN NATRIURETIC PEPTIDE: B NATRIURETIC PEPTIDE 5: 56.9 pg/mL (ref 0.0–100.0)

## 2014-11-28 MED ORDER — CYCLOBENZAPRINE HCL 10 MG PO TABS: 10.0000 mg | ORAL_TABLET | Freq: Three times a day (TID) | ORAL | Status: DC | PRN

## 2014-11-28 MED ORDER — SODIUM CHLORIDE 0.9 % IV BOLUS (SEPSIS)
500.0000 mL | Freq: Once | INTRAVENOUS | Status: AC
  Administered 2014-11-28: 500 mL via INTRAVENOUS

## 2014-11-28 MED ORDER — ALBUTEROL SULFATE (2.5 MG/3ML) 0.083% IN NEBU: 2.5000 mg | INHALATION_SOLUTION | Freq: Four times a day (QID) | RESPIRATORY_TRACT | Status: DC | PRN

## 2014-11-28 MED ORDER — SENNOSIDES-DOCUSATE SODIUM 8.6-50 MG PO TABS: 1.0000 | ORAL_TABLET | Freq: Every evening | ORAL | Status: DC | PRN

## 2014-11-28 MED ORDER — INFLUENZA VAC SPLIT QUAD 0.5 ML IM SUSY
0.5000 mL | PREFILLED_SYRINGE | INTRAMUSCULAR | Status: AC
  Administered 2014-11-29: 0.5 mL via INTRAMUSCULAR
  Filled 2014-11-28: qty 0.5

## 2014-11-28 MED ORDER — BUPROPION HCL ER (XL) 150 MG PO TB24: 300.0000 mg | ORAL_TABLET | Freq: Every day | ORAL | Status: DC

## 2014-11-28 MED ORDER — CARVEDILOL 25 MG PO TABS
25.0000 mg | ORAL_TABLET | Freq: Two times a day (BID) | ORAL | Status: DC
  Administered 2014-11-28 – 2014-11-30 (×4): 25 mg via ORAL
  Filled 2014-11-28 (×4): qty 1

## 2014-11-28 MED ORDER — AMLODIPINE BESYLATE 10 MG PO TABS
10.0000 mg | ORAL_TABLET | Freq: Every day | ORAL | Status: DC
  Administered 2014-11-29 – 2014-11-30 (×2): 10 mg via ORAL
  Filled 2014-11-28 (×2): qty 1

## 2014-11-28 MED ORDER — ATORVASTATIN CALCIUM 80 MG PO TABS
80.0000 mg | ORAL_TABLET | Freq: Every day | ORAL | Status: DC
  Administered 2014-11-29 – 2014-11-30 (×2): 80 mg via ORAL
  Filled 2014-11-28 (×2): qty 1

## 2014-11-28 MED ORDER — STROKE: EARLY STAGES OF RECOVERY BOOK
Freq: Once | Status: AC
  Administered 2014-11-28: 16:00:00
  Filled 2014-11-28: qty 1

## 2014-11-28 MED ORDER — ASPIRIN EC 81 MG PO TBEC
81.0000 mg | DELAYED_RELEASE_TABLET | Freq: Every day | ORAL | Status: DC
  Administered 2014-11-29: 81 mg via ORAL
  Filled 2014-11-28: qty 1

## 2014-11-28 MED ORDER — FUROSEMIDE 40 MG PO TABS
40.0000 mg | ORAL_TABLET | Freq: Every day | ORAL | Status: DC
Start: 2014-11-29 — End: 2014-11-29
  Administered 2014-11-29: 40 mg via ORAL
  Filled 2014-11-28: qty 1

## 2014-11-28 MED ORDER — POTASSIUM CHLORIDE CRYS ER 20 MEQ PO TBCR
20.0000 meq | EXTENDED_RELEASE_TABLET | Freq: Every day | ORAL | Status: DC
Start: 2014-11-29 — End: 2014-11-30
  Administered 2014-11-29 – 2014-11-30 (×2): 20 meq via ORAL
  Filled 2014-11-28 (×2): qty 1

## 2014-11-28 MED ORDER — ACETAMINOPHEN 325 MG PO TABS: 650.0000 mg | ORAL_TABLET | ORAL | Status: DC | PRN

## 2014-11-28 MED ORDER — TRAZODONE HCL 100 MG PO TABS
100.0000 mg | ORAL_TABLET | Freq: Every evening | ORAL | Status: DC | PRN
  Administered 2014-11-28 – 2014-11-29 (×2): 100 mg via ORAL
  Filled 2014-11-28 (×2): qty 1

## 2014-11-28 MED ORDER — TRAMADOL HCL 50 MG PO TABS: 50.0000 mg | ORAL_TABLET | Freq: Two times a day (BID) | ORAL | Status: DC | PRN

## 2014-11-28 MED ORDER — BUPROPION HCL ER (XL) 150 MG PO TB24
150.0000 mg | ORAL_TABLET | Freq: Every day | ORAL | Status: DC
  Administered 2014-11-29: 150 mg via ORAL
  Filled 2014-11-28: qty 1

## 2014-11-28 NOTE — ED Notes (Signed)
Pt family Ronney Lion [phone], [phone]

## 2014-11-28 NOTE — Procedures (Signed)
History: 64 yo F with memory difficulty  Sedation: None  Technique: This is a 19 channel routine scalp EEG performed at the bedside with bipolar and monopolar montages arranged in accordance to the international 10/20 system of electrode placement. One channel was dedicated to EKG recording.    Background: The background consists of intermixed alpha and beta activities. There is a well defined posterior dominant rhythm of 8.5 Hz that attenuates with eye opening. There is an increase in diffuse delta assocaited with drowsiness.   Photic stimulation: Physiologic driving is not performed  EEG Abnormalities: None  Clinical Interpretation: This normal EEG is recorded in the waking and drowsy state. There was no seizure or seizure predisposition recorded on this study.   Roland Rack, MD [facility] [phone]  If 7pm- 7am, please page neurology on call as listed in [facility].

## 2014-11-28 NOTE — ED Notes (Signed)
Attempt to call report x 1  

## 2014-11-28 NOTE — ED Provider Notes (Signed)
CSN: [id number]     Arrival date & time 11/28/14  0625 History   First MD Initiated Contact with Patient 11/28/14 [phone]     Chief Complaint  Patient presents with  . Dizziness     (Consider location/radiation/quality/duration/timing/severity/associated sxs/prior Treatment) HPI Comments: 64 year old female with history of CAD, lipids, high blood pressure, vascular disease, severe mitral regurg, mild aortic regurg, planned CABG and valve replacement surgery tomorrow presents with intermittent confusion and lightheadedness. Since Friday family has noticed episodes of slurred speech and patient making statements that aren't correct friends since getting the day incorrect or the meal type are on. No history of similar. No fevers or chills. No history of stroke, patient's been off Plavix in preparation for surgery. No syncope or chest pain. Very mild shortness of breath, no weight changes, no leg swelling. No balance issues. No vision loss.  Patient is a 64 y.o. female presenting with dizziness. The history is provided by the patient and a parent.  Dizziness Associated symptoms: weakness (general)   Associated symptoms: no chest pain, no headaches, no shortness of breath and no vomiting     Past Medical History  Diagnosis Date  . Hyperlipidemia   . Hypertension   . GERD (gastroesophageal reflux disease)   . Leg pain   . Headache(784.0)   . Carotid artery occlusion   . COPD (chronic obstructive pulmonary disease)   . CAD (coronary artery disease)   . Peripheral vascular disease   . Tobacco abuse   . Shortness of breath dyspnea   . Renal vascular disease 10/26/2014    bilateral stents placed   . Anemia    Past Surgical History  Procedure Laterality Date  . Carotid endarterectomy  11/21/2007    left  . Angioplasty / stenting iliac  2010    right external iliac by Dr. Irish Lack  . Coronary angioplasty with stent placement  6/09    LAD 2.5x12 Promus  . Femoral-popliteal bypass graft  10/12     left Dr. Kellie Simmering  . Abdominal hysterectomy    . Cardiac catheterization  11/13/11    Left Heart Cath. with Coronary Angiogram  . Cardiac catheterization N/A 08/24/2014    Procedure: Left Heart Cath and Coronary Angiography;  Surgeon: Wellington Hampshire, MD;  Location: [place] [facility];  Service: Cardiovascular;  Laterality: N/A;  . Renal artery stent Bilateral   . Tee without cardioversion N/A 10/24/2014    Procedure: TRANSESOPHAGEAL ECHOCARDIOGRAM (TEE);  Surgeon: Thayer Headings, MD;  Location: [facility];  Service: Cardiovascular;  Laterality: N/A;  . Cardiac catheterization N/A 10/26/2014    Procedure: Right/Left Heart Cath and Coronary Angiography;  Surgeon: Peter M Martinique, MD; oLAD 70%, mLAD 70% ISR, D2 30%, OFC 30%, RCA 20%, EF nl, low R heart pressures after diuresis, severe MR   Family History  Problem Relation Age of Onset  . Cancer Mother     BRAIN AND LUNG  . Hypertension Father   . Heart disease Father   . Heart attack Neg Hx   . Stroke Paternal Aunt     great aunt  . Hypertension Mother   . Hypertension Brother    Social History  Substance Use Topics  . Smoking status: Former Smoker -- 0.50 packs/day for 35 years    Types: Cigarettes    Quit date: 10/31/2014  . Smokeless tobacco: Never Used     Comment: has not started wellbutrin yet  . Alcohol Use: No   OB History    No  data available     Review of Systems  Constitutional: Negative for fever and chills.  HENT: Negative for congestion.   Eyes: Negative for visual disturbance.  Respiratory: Negative for shortness of breath.   Cardiovascular: Negative for chest pain.  Gastrointestinal: Negative for vomiting and abdominal pain.  Genitourinary: Negative for dysuria and flank pain.  Musculoskeletal: Negative for back pain, neck pain and neck stiffness.  Skin: Negative for rash.  Neurological: Positive for dizziness, speech difficulty, weakness (general) and light-headedness. Negative for syncope and headaches.       Allergies  Lisinopril; Aspirin; Chantix; and Penicillins  Home Medications   Prior to Admission medications   Medication Sig Start Date End Date Taking? Authorizing Provider  acetaminophen (TYLENOL) 325 MG tablet Take 2 tablets (650 mg total) by mouth every 4 (four) hours as needed for fever, headache or mild pain. 08/25/14  Yes Luke K Kilroy, PA-C  albuterol (PROVENTIL HFA;VENTOLIN HFA) 108 (90 BASE) MCG/ACT inhaler Inhale 2 puffs into the lungs every 6 (six) hours as needed for wheezing or shortness of breath. 10/31/14  Yes Archie Patten, MD  amLODipine (NORVASC) 10 MG tablet Take 1 tablet (10 mg total) by mouth daily. 09/01/14  Yes Archie Patten, MD  aspirin EC 81 MG tablet Take 1 tablet (81 mg total) by mouth daily. 08/25/14  Yes Luke K Kilroy, PA-C  atorvastatin (LIPITOR) 80 MG tablet Take 1 tablet (80 mg total) by mouth daily. 09/01/14  Yes Archie Patten, MD  buPROPion (WELLBUTRIN XL) 150 MG 24 hr tablet Take 1 tablet (150 mg total) by mouth daily. For 3 days, then increase '300mg'$  (2 tablets) daily Patient taking differently: Take 150 mg by mouth 2 (two) times daily.  10/17/14  Yes Archie Patten, MD  carvedilol (COREG) 25 MG tablet Take 1 tablet (25 mg total) by mouth 2 (two) times daily with a meal. ONE TABLET IN AM ONE TABLET IN PM 09/07/14  Yes Brittainy M Simmons, PA-C  cyclobenzaprine (FLEXERIL) 10 MG tablet TAKE 1 TABLET BY MOUTH 3 TIMES DAILY AS NEEDED FOR MUSCLE SPASMS 11/18/14  Yes Archie Patten, MD  furosemide (LASIX) 40 MG tablet Take 1 tablet (40 mg total) by mouth daily. 11/22/14  Yes Archie Patten, MD  nitroGLYCERIN (NITROSTAT) 0.4 MG SL tablet Place 1 tablet (0.4 mg total) under the tongue every 5 (five) minutes x 3 doses as needed for chest pain. 10/28/14  Yes Rhonda G Barrett, PA-C  potassium chloride SA (K-DUR,KLOR-CON) 20 MEQ tablet Take 1 tablet (20 mEq total) by mouth daily. 10/31/14  Yes Archie Patten, MD  traMADol (ULTRAM) 50 MG tablet Take 1 tablet (50 mg  total) by mouth every 8 (eight) hours as needed. 11/18/14  Yes Archie Patten, MD  traZODone (DESYREL) 100 MG tablet Take 1 tablet (100 mg total) by mouth at bedtime as needed for sleep. 09/16/14  Yes Crystal Libby Maw, MD   BP 150/57 mmHg  Pulse 36  Temp(Src) 97.6 F (36.4 C)  Resp 18  Ht 6' 1.5" (1.867 m)  Wt 170 lb (77.111 kg)  BMI 22.12 kg/m2  SpO2 96% Physical Exam  Constitutional: She is oriented to person, place, and time. She appears well-developed and well-nourished.  HENT:  Head: Normocephalic and atraumatic.  Mild dry mm  Eyes: Conjunctivae are normal. Right eye exhibits no discharge. Left eye exhibits no discharge.  Neck: Normal range of motion. Neck supple. No tracheal deviation present.  Cardiovascular: Normal rate and regular rhythm.  Pulmonary/Chest: Effort normal and breath sounds normal.  Abdominal: Soft. She exhibits no distension. There is no tenderness. There is no guarding.  Musculoskeletal: She exhibits no edema.  Neurological: She is alert and oriented to person, place, and time. GCS eye subscore is 4. GCS verbal subscore is 5. GCS motor subscore is 6.  5+ strength in UE and LE with f/e at major joints. Sensation to palpation intact in UE and LE. CNs 2-12 grossly intact.  EOMFI.  PERRL.   Finger nose and coordination intact bilateral.   Visual fields intact to finger testing. No nystagmus   Skin: Skin is warm. No rash noted.  Psychiatric: She has a normal mood and affect.  Nursing note and vitals reviewed.   ED Course  Procedures (including critical care time) Labs Review Labs Reviewed  BASIC METABOLIC PANEL - Abnormal; Notable for the following:    Sodium 133 (*)    Chloride 94 (*)    Creatinine, Ser 1.27 (*)    GFR calc non Af Amer 45 (*)    GFR calc Af Amer 52 (*)    All other components within normal limits  CBC WITH DIFFERENTIAL/PLATELET - Abnormal; Notable for the following:    WBC 11.2 (*)    MCH 25.8 (*)    Platelets 122 (*)    All  other components within normal limits  BRAIN NATRIURETIC PEPTIDE  URINALYSIS, ROUTINE W REFLEX MICROSCOPIC (NOT AT [facility])  TSH  COMPREHENSIVE METABOLIC PANEL  AMMONIA  VITAMIN B12  RPR  FOLATE    Imaging Review Dg Chest 2 View  11/28/2014   CLINICAL DATA:  Shortness of breath for 2 days.  EXAM: CHEST  2 VIEW  COMPARISON:  11/25/2014 and prior chest radiographs dating back to 01/16/2011  FINDINGS: The cardiomediastinal silhouette is unremarkable.  COPD/emphysema changes again identified.  There is no evidence of focal airspace disease, pulmonary edema, suspicious pulmonary nodule/mass, pleural effusion, or pneumothorax. No acute bony abnormalities are identified.  IMPRESSION: COPD/emphysema without evidence of acute cardiopulmonary disease.   Electronically Signed   By: Margarette Canada M.D.   On: 11/28/2014 08:32   Ct Head Wo Contrast  11/28/2014   CLINICAL DATA:  Dizziness, slurred speech, confusion. Impending aortic valve replacement surgery.  EXAM: CT HEAD WITHOUT CONTRAST  TECHNIQUE: Contiguous axial images were obtained from the base of the skull through the vertex without intravenous contrast.  COMPARISON:  None.  FINDINGS: Mild atrophy with diffuse sulcal prominence. Scattered calcifications within the bilateral basal ganglia. Gray-white differentiation is otherwise well maintained without CT evidence of acute large territory infarct. Exuberant calcifications about the midline falx. No intraparenchymal or extra-axial mass or hemorrhage. Normal size and configuration of the ventricles and basilar cisterns. No midline shift. Limited visualization of the paranasal sinuses and mastoid air cells is normal. No air-fluid levels. Regional soft tissues appear normal. No displaced calvarial fracture.  IMPRESSION: Mild atrophy without acute intracranial process.   Electronically Signed   By: Sandi Mariscal M.D.   On: 11/28/2014 08:49   Mr Brain Wo Contrast  11/28/2014   CLINICAL DATA:  64 year old female with  altered speech and mental status since Friday. Intermittent dizziness. Planned aortic valve replacement tomorrow. Initial encounter.  EXAM: MRI HEAD WITHOUT CONTRAST  TECHNIQUE: Multiplanar, multiecho pulse sequences of the brain and surrounding structures were obtained without intravenous contrast.  COMPARISON:  Head CT without contrast 0837 hours today.  FINDINGS: Incidental bulky dural calcifications along the interhemispheric fissure. No restricted diffusion to suggest acute infarction.  No midline shift, mass effect, evidence of mass lesion, ventriculomegaly, extra-axial collection or acute intracranial hemorrhage. Cervicomedullary junction and pituitary are within normal limits. Major intracranial vascular flow voids are within normal limits.  Scattered small bilateral cerebral white matter T2 and FLAIR hyperintense foci, mostly subcortical. The configuration is nonspecific an the extent is moderate for age. No cortical encephalomalacia or chronic cerebral blood products. Deep gray matter nuclei, brainstem, cerebellum, and visualized internal auditory structures appear normal.  Paranasal sinuses and mastoids are clear. Negative orbits soft tissues. Negative scalp soft tissues. Normal bone marrow signal. Negative visualized cervical spine.  IMPRESSION: 1.  No acute intracranial abnormality. 2. Moderate for age nonspecific cerebral white matter signal changes, most commonly due to chronic small vessel disease.   Electronically Signed   By: Genevie Ann M.D.   On: 11/28/2014 09:32   I have personally reviewed and evaluated these images and lab results as part of my medical decision-making.   EKG Interpretation None      MDM   Final diagnoses:  Moderate aortic regurgitation  Transient confusion   Patient presents with intermittent confusion lightheadedness general weakness. Patient has surgery planned for tomorrow. Plan for screening blood work to check for anemia and electrolytes, urinalysis with  intermittent confusion and CT/MRI for stroke with slurred speech and patient is been off her blood thinners. Likely plan for medical admission. MRI results reviewed no acute stroke. Urinalysis pending. Discussed with neurology who agreed with significant workup with surgery planned for tomorrow. Discussed with family medicine for admission.  The patients results and plan were reviewed and discussed.   Any x-rays performed were independently reviewed by myself.   Differential diagnosis were considered with the presenting HPI.  Medications  sodium chloride 0.9 % bolus 500 mL (500 mLs Intravenous New Bag/Given 11/28/14 0950)    Filed Vitals:   11/28/14 0730 11/28/14 0800 11/28/14 1000 11/28/14 1015  BP: 140/61 137/66 157/114 150/57  Pulse: 59   36  Temp:      Resp: '16 21  18  '$ Height:      Weight:      SpO2: 100%   96%    Final diagnoses:  Moderate aortic regurgitation  Transient confusion    Admission/ observation were discussed with the admitting physician, patient and/or family and they are comfortable with the plan.       Elnora Morrison, MD 11/28/14 1027

## 2014-11-28 NOTE — ED Notes (Signed)
Pt and family made aware of bed assignment 

## 2014-11-28 NOTE — H&P (Signed)
[facility] Admission History and Physical Service Pager: [phone]  Patient name: Leah [name] [facility] Medical record number: [id number] Date of birth: 1950/07/31 Age: 64 y.o. Gender: female  Primary Care Provider: Kathrine Cords, MD Consultants: Neurology   Code Status: Full per discussion on admission  Chief Complaint: confusion, slurred speech  Assessment and Plan: Leah [name] is a 64 y.o. female presenting with confusion, slurred speech, and leg weakness. PMH is significant for severe mitral regurgitation, aortic insufficiency, CAD, PVD, HLD, HTN, tobacco abuse (stopped recently), COPD, diastolic CHF  Weakness, confusion, and slurred speech: Patient has a h/o a left carotid enterectomy in 10/2007. CT and MRI on admission without acute findings. Physical exam with possibly some slowing of the speech compared to her baseline, but overall no true deficits noted. Given patient's extensive vascular history, this could be secondary to a TIA. Has been on Plavix however discontinued this in preparation for aortic and mitral valve replacement and CABG scheduled for 9/6. Of note, the patient was started on Wellbutrin approximately 3-4 weeks ago for assistance with tobacco cessation: this could be causing some of her confusion and weakness   - Admit to telemetry, attending Dr. Gwendlyn Deutscher - Neurology following, appreciate recommendations  - TSH, vitamin B12, folate, and ammonia normal. BUN normal - EEG ordered  - carotid dopplers  - Have gone down on Wellbutrin '150mg'$  to see if this improves (do not want to abruptly discontinue).   CAD: s/p PCI with stenting of the mid LAD 08/2007. Cardiac cath done 08/24/14 revealed distal stent restenosis of 70% that was not felt to be hemodynamically significant and the plan is for medical treatment. She was seen then seen in follow up 09/07/14 and was doing well. An echo done during then showed an EF of 60-65%, with moderate to severe MR,  and mild to moderate AR, and grade 2 diastolic dysfunction.Most recent TEE on 10/24/2014 shoed LVEF to 55-60%, moderate AI, severe MR, moderately calcified aorta, and no evidence of thrombus in LA. TEE is suggestive of rheumatic of valve disease. EKG on admission stable.  - cardiothoracic surgery consulted as she was supposed to have an AVR, MVR, CABG with Dr. Servando Snare tomorrow morning. - Dr. Roxan Hockey saw patient, stated they will hold on surgery, will update Dr. Servando Snare tomorrow and cancel OR - Holding Plavix for now in case they want to have surgery done sooner rather than later. - Consider restarting   HTN: On amlodipine, Coreg, and Lasix at home. H/o angioedema therefore no ACE-inhibitors or ARBs.  - BPs slightly elevated on admission to the 140s/60s, will continue to monitor - If needed, can give PRN hydralazine (HRs in 50-70s therefore I'd defer increasing beta blocker)  Chronic diastolic heart failure: No evidence of fluid overload on exam. - Continue home lasix and KDUR - continue to monitor    Smoking cessation:  Patient has stopped smoking - Wellbutrin '150mg'$  (down from '300mg'$  to assess if this makes a difference in her confusion).   PVD: H/o bilateral renal artery stenting in 2009 and L femoropopliteal bypass grafting by Dr. Kellie Simmering in 2012 - stable  Pulmonary nodule/COPD: Severe diffusion defect on PFTs noted. Noted on CXR 10/27/14, thought to be postinfectious but suggested f/u in 3-6 months.  - follow up CT chest in 01/2015 - albuterol PRN  FEN/GI: IV Saline lock/ Heart Healthy diet Prophylaxis: SCDs  Disposition: admit to telemetry  History of Present Illness:  Leah [name] is a 64 y.o. female presenting with bilateral  lower extremity weakness, dizziness, and slurred speech for 5 days.   Last week, the patient noted more confusion (notes she would forget what she was doing, had difficult finding words). Then, the patient noted her legs were heavy and she collapsed  back onto the bed on Friday morning after waking up. She felt like her legs were wobbly and felt like she had "a few drinks." Her family members noted slurred speech started on Friday. She was also noted to be using the wrong words.  Saturday she felt like she could move a little better. Sunday she noted some dizziness and worsening leg weakness. Today she fell and hit her L thigh on a table but denies injury, head trauma, or LOC.  Denies UE weakness, change in sensation  She's noticed black spots in her vision that has been stable to slightly worse over the last few months. She's had a headache intermittently since Saturday that is in the R frontal region.   Eating well. No fevers. No chest pain, swelling, orthopnea, PND, SOB.   Lives with her brother and her son.   In the ED, a CT head and MRI did not reveal any acute changes. Neurology was consulted and recommended observation.  She was given a 500cc NS bolus.   Review Of Systems: Per HPI with the following additions: no Otherwise 12 point review of systems was performed and was unremarkable.  Patient Active Problem List   Diagnosis Date Noted  . Confusion 11/28/2014  . Moderate aortic regurgitation 10/26/2014  . Renal vascular disease 10/26/2014  . Acute on chronic diastolic congestive heart failure 10/21/2014  . Tobacco use 10/21/2014  . Troponin level elevated-(felt to be from CHF) 10/21/2014  . Diastolic dysfunction, grade 2 by echo June 2016 10/21/2014  . Carpal tunnel syndrome 10/17/2014  . Left shoulder pain 09/17/2014  . Pulmonary nodule, right-needs repeat CT in Dec 2016 09/17/2014  . Abnormal CXR 08/25/2014  . Severe mitral regurgitation 08/25/2014  . Unstable angina 08/22/2014  . Hematuria 07/01/2014  . Burning sensation of feet 02/01/2014  . Jaw pain 10/18/2012  . Low back pain 07/05/2012  . Insomnia 07/05/2012  . PVC (premature ventricular contraction) 10/02/2011  . Fatigue 09/04/2011  . Chest pain 09/04/2011  .  History of angioedema with ACE 2013 06/14/2011  . PVD- s/p multiple proceedures 06/04/2011  . Hyperlipidemia 09/28/2008  . Essential hypertension, benign 09/28/2008  . CAD S/P LAD DES 2009 with 70% ISR 08/24/14 09/28/2008   Past Medical History: Past Medical History  Diagnosis Date  . Hyperlipidemia   . Hypertension   . GERD (gastroesophageal reflux disease)   . Leg pain   . Headache(784.0)   . Carotid artery occlusion   . COPD (chronic obstructive pulmonary disease)   . CAD (coronary artery disease)   . Peripheral vascular disease   . Tobacco abuse   . Shortness of breath dyspnea   . Renal vascular disease 10/26/2014    bilateral stents placed   . Anemia    Past Surgical History: Past Surgical History  Procedure Laterality Date  . Carotid endarterectomy  11/21/2007    left  . Angioplasty / stenting iliac  2010    right external iliac by Dr. Irish Lack  . Coronary angioplasty with stent placement  6/09    LAD 2.5x12 Promus  . Femoral-popliteal bypass graft  10/12    left Dr. Kellie Simmering  . Abdominal hysterectomy    . Cardiac catheterization  11/13/11    Left Heart Cath. with  Coronary Angiogram  . Cardiac catheterization N/A 08/24/2014    Procedure: Left Heart Cath and Coronary Angiography;  Surgeon: Wellington Hampshire, MD;  Location: [facility];  Service: Cardiovascular;  Laterality: N/A;  . Renal artery stent Bilateral   . Tee without cardioversion N/A 10/24/2014    Procedure: TRANSESOPHAGEAL ECHOCARDIOGRAM (TEE);  Surgeon: Thayer Headings, MD;  Location: [facility];  Service: Cardiovascular;  Laterality: N/A;  . Cardiac catheterization N/A 10/26/2014    Procedure: Right/Left Heart Cath and Coronary Angiography;  Surgeon: Peter M Martinique, MD; oLAD 70%, mLAD 70% ISR, D2 30%, OFC 30%, RCA 20%, EF nl, low R heart pressures after diuresis, severe MR   Social History: Social History  Substance Use Topics  . Smoking status: Former Smoker -- 0.50 packs/day for 35 years    Types:  Cigarettes    Quit date: 10/31/2014  . Smokeless tobacco: Never Used     Comment: has not started wellbutrin yet  . Alcohol Use: No   Additional social history: Has stopped smoking x 3 weeks. No alcohol or drug use.   Please also refer to relevant sections of EMR.  Family History: Family History  Problem Relation Age of Onset  . Cancer Mother     BRAIN AND LUNG  . Hypertension Father   . Heart disease Father   . Heart attack Neg Hx   . Stroke Paternal Aunt     great aunt  . Hypertension Mother   . Hypertension Brother    Allergies and Medications: Allergies  Allergen Reactions  . Lisinopril Swelling    Angioedema 06/10/11  . Aspirin Other (See Comments)    Upset stomach  . Chantix [Varenicline]     insomnia  . Penicillins Hives   No current facility-administered medications on file prior to encounter.   Current Outpatient Prescriptions on File Prior to Encounter  Medication Sig Dispense Refill  . acetaminophen (TYLENOL) 325 MG tablet Take 2 tablets (650 mg total) by mouth every 4 (four) hours as needed for fever, headache or mild pain.    Marland Kitchen albuterol (PROVENTIL HFA;VENTOLIN HFA) 108 (90 BASE) MCG/ACT inhaler Inhale 2 puffs into the lungs every 6 (six) hours as needed for wheezing or shortness of breath. 1 Inhaler 0  . amLODipine (NORVASC) 10 MG tablet Take 1 tablet (10 mg total) by mouth daily. 30 tablet 2  . aspirin EC 81 MG tablet Take 1 tablet (81 mg total) by mouth daily.    Marland Kitchen atorvastatin (LIPITOR) 80 MG tablet Take 1 tablet (80 mg total) by mouth daily. 30 tablet 2  . buPROPion (WELLBUTRIN XL) 150 MG 24 hr tablet Take 1 tablet (150 mg total) by mouth daily. For 3 days, then increase '300mg'$  (2 tablets) daily (Patient taking differently: Take 150 mg by mouth 2 (two) times daily. ) 60 tablet 0  . carvedilol (COREG) 25 MG tablet Take 1 tablet (25 mg total) by mouth 2 (two) times daily with a meal. ONE TABLET IN AM ONE TABLET IN PM 60 tablet 6  . cyclobenzaprine (FLEXERIL)  10 MG tablet TAKE 1 TABLET BY MOUTH 3 TIMES DAILY AS NEEDED FOR MUSCLE SPASMS 70 tablet 0  . furosemide (LASIX) 40 MG tablet Take 1 tablet (40 mg total) by mouth daily. 30 tablet 6  . nitroGLYCERIN (NITROSTAT) 0.4 MG SL tablet Place 1 tablet (0.4 mg total) under the tongue every 5 (five) minutes x 3 doses as needed for chest pain. 25 tablet 12  . potassium chloride SA (  K-DUR,KLOR-CON) 20 MEQ tablet Take 1 tablet (20 mEq total) by mouth daily. 30 tablet 6  . traMADol (ULTRAM) 50 MG tablet Take 1 tablet (50 mg total) by mouth every 8 (eight) hours as needed. 60 tablet 0  . traZODone (DESYREL) 100 MG tablet Take 1 tablet (100 mg total) by mouth at bedtime as needed for sleep. 30 tablet 2  . [DISCONTINUED] simvastatin (ZOCOR) 40 MG tablet Take 40 mg by mouth at bedtime.       Objective: BP 156/66 mmHg  Pulse 72  Temp(Src) 97.6 F (36.4 C)  Resp 15  Ht 6' 1.5" (1.867 m)  Wt 170 lb (77.111 kg)  BMI 22.12 kg/m2  SpO2 95% Exam: General: Lying in bed in NAD. Non-toxic appearing. Pleasant Eyes: Conjunctivae non-injected.  ENTM: Moist mucous membranes. Oropharynx clear. No nasal discharge.  Neck: Supple, no LAD Cardiovascular: RRR. II/VI holosystolic murmur. No pitting edema noted. Respiratory: No increased WOB. CTAB without wheezing, rhonchi, or crackles noted. Abdomen: +BS, soft, non-distended, non-tender.  MSK: Normal bulk and tone noted. No gross deformities noted.  Skin: No rashes noted  Neuro: A&O x4. Speech clear, possibly slightly slowed compared to her baseline. EOMI without nystagmus. Uvula and tongue midline. Facial movements symmetric. Sensation over the face intact/equal. Shoulder shrug intact bilaterally.  5/5 strength in the upper extremities and lower extremities bilaterally. Sensation intact bilaterally in the upper and lower extremities. Normal rapid alternating movements. Finger to nose slowed on the left compared to the right. Normal DTRs. Downgoing Babinski bilaterally.   Psych:  Appropriate mood and affect.    Labs and Imaging: CBC BMET   Recent Labs Lab 11/28/14 0753  WBC 11.2*  HGB 12.8  HCT 39.1  PLT 122*    Recent Labs Lab 11/28/14 1030  NA 136  K 3.7  CL 99*  CO2 28  BUN 18  CREATININE 1.13*  GLUCOSE 98  CALCIUM 9.7    BNP 56.9     Ammonia 17 Folate 6.5  Vitamin B12: 498  TSH 1.756 U/A: trace hemoglobin, negative LE and nitrite  EKG: NSR, HR 56, no ST elevation or depression. QTc 434.   Dg Chest 2 View  11/28/2014   CLINICAL DATA:  Shortness of breath for 2 days.  EXAM: CHEST  2 VIEW  COMPARISON:  11/25/2014 and prior chest radiographs dating back to 01/16/2011  FINDINGS: The cardiomediastinal silhouette is unremarkable.  COPD/emphysema changes again identified.  There is no evidence of focal airspace disease, pulmonary edema, suspicious pulmonary nodule/mass, pleural effusion, or pneumothorax. No acute bony abnormalities are identified.  IMPRESSION: COPD/emphysema without evidence of acute cardiopulmonary disease.   Electronically Signed   By: Margarette Canada M.D.   On: 11/28/2014 08:32   Ct Head Wo Contrast  11/28/2014   CLINICAL DATA:  Dizziness, slurred speech, confusion. Impending aortic valve replacement surgery.  EXAM: CT HEAD WITHOUT CONTRAST  TECHNIQUE: Contiguous axial images were obtained from the base of the skull through the vertex without intravenous contrast.  COMPARISON:  None.  FINDINGS: Mild atrophy with diffuse sulcal prominence. Scattered calcifications within the bilateral basal ganglia. Gray-white differentiation is otherwise well maintained without CT evidence of acute large territory infarct. Exuberant calcifications about the midline falx. No intraparenchymal or extra-axial mass or hemorrhage. Normal size and configuration of the ventricles and basilar cisterns. No midline shift. Limited visualization of the paranasal sinuses and mastoid air cells is normal. No air-fluid levels. Regional soft tissues appear normal. No  displaced calvarial fracture.  IMPRESSION: Mild atrophy  without acute intracranial process.   Electronically Signed   By: Sandi Mariscal M.D.   On: 11/28/2014 08:49   Mr Brain Wo Contrast  11/28/2014   CLINICAL DATA:  64 year old female with altered speech and mental status since Friday. Intermittent dizziness. Planned aortic valve replacement tomorrow. Initial encounter.  EXAM: MRI HEAD WITHOUT CONTRAST  TECHNIQUE: Multiplanar, multiecho pulse sequences of the brain and surrounding structures were obtained without intravenous contrast.  COMPARISON:  Head CT without contrast 0837 hours today.  FINDINGS: Incidental bulky dural calcifications along the interhemispheric fissure. No restricted diffusion to suggest acute infarction. No midline shift, mass effect, evidence of mass lesion, ventriculomegaly, extra-axial collection or acute intracranial hemorrhage. Cervicomedullary junction and pituitary are within normal limits. Major intracranial vascular flow voids are within normal limits.  Scattered small bilateral cerebral white matter T2 and FLAIR hyperintense foci, mostly subcortical. The configuration is nonspecific an the extent is moderate for age. No cortical encephalomalacia or chronic cerebral blood products. Deep gray matter nuclei, brainstem, cerebellum, and visualized internal auditory structures appear normal.  Paranasal sinuses and mastoids are clear. Negative orbits soft tissues. Negative scalp soft tissues. Normal bone marrow signal. Negative visualized cervical spine.  IMPRESSION: 1.  No acute intracranial abnormality. 2. Moderate for age nonspecific cerebral white matter signal changes, most commonly due to chronic small vessel disease.   Electronically Signed   By: Genevie Ann M.D.   On: 11/28/2014 09:32    Archie Patten, MD 11/28/2014, 12:15 PM PGY-2, [place] [facility] pager: [phone], text pages welcome

## 2014-11-28 NOTE — ED Notes (Addendum)
Pt arrives via EMS, called out by family for "acting weird." EMS reports pt has had changes in her speech and has been uneasy on her feet since Friday. Per EMS, family states that ambulation was normal this morning. Pt only reports intermittent dizziness since Friday, but states she feels fine. Pt has planned aortic valve replacement 9/6. Per EMS, family states that patient hasn't slept since she retired, but takes trazodone. Unclear if anything changed this morning prompting 911 call. [facility] in triage 0. Pt alert and oriented x4, ambulating independently and with a steady gait.

## 2014-11-28 NOTE — Consult Note (Signed)
NEURO HOSPITALIST CONSULT NOTE   Referring physician: Zavits   Reason for Consult: recent problem with recalling days  HPI:                                                                                                                                          Leah [name] is an 64 y.o. female who is to have surgery on her mitral and Aortic valve tomorrow.  Family has noted that over the past 3 days she has intermittently been having difficulty recalling the days and also noted to be slower with her responses. She was brought to ED for this reason.  Family member at bedside also noted she has recently stopped smoking and has been under more stress than usual due to the upcoming surgery.   Past Medical History  Diagnosis Date  . Hyperlipidemia   . Hypertension   . GERD (gastroesophageal reflux disease)   . Leg pain   . Headache(784.0)   . Carotid artery occlusion   . COPD (chronic obstructive pulmonary disease)   . CAD (coronary artery disease)   . Peripheral vascular disease   . Tobacco abuse   . Shortness of breath dyspnea   . Renal vascular disease 10/26/2014    bilateral stents placed   . Anemia     Past Surgical History  Procedure Laterality Date  . Carotid endarterectomy  11/21/2007    left  . Angioplasty / stenting iliac  2010    right external iliac by Dr. Irish Lack  . Coronary angioplasty with stent placement  6/09    LAD 2.5x12 Promus  . Femoral-popliteal bypass graft  10/12    left Dr. Kellie Simmering  . Abdominal hysterectomy    . Cardiac catheterization  11/13/11    Left Heart Cath. with Coronary Angiogram  . Cardiac catheterization N/A 08/24/2014    Procedure: Left Heart Cath and Coronary Angiography;  Surgeon: Wellington Hampshire, MD;  Location: [facility];  Service: Cardiovascular;  Laterality: N/A;  . Renal artery stent Bilateral   . Tee without cardioversion N/A 10/24/2014    Procedure: TRANSESOPHAGEAL ECHOCARDIOGRAM (TEE);  Surgeon: Thayer Headings, MD;  Location: [facility];  Service: Cardiovascular;  Laterality: N/A;  . Cardiac catheterization N/A 10/26/2014    Procedure: Right/Left Heart Cath and Coronary Angiography;  Surgeon: Shya Kovatch M Martinique, MD; oLAD 70%, mLAD 70% ISR, D2 30%, OFC 30%, RCA 20%, EF nl, low R heart pressures after diuresis, severe MR    Family History  Problem Relation Age of Onset  . Cancer Mother     BRAIN AND LUNG  . Hypertension Father   . Heart disease Father   . Heart attack Neg Hx   . Stroke Paternal Aunt     great aunt  .  Hypertension Mother   . Hypertension Brother      Social History:  reports that she quit smoking about 4 weeks ago. Her smoking use included Cigarettes. She has a 17.5 pack-year smoking history. She has never used smokeless tobacco. She reports that she does not drink alcohol or use illicit drugs.  Allergies  Allergen Reactions  . Lisinopril Swelling    Angioedema 06/10/11  . Aspirin Other (See Comments)    Upset stomach  . Chantix [Varenicline]     insomnia  . Penicillins Hives    MEDICATIONS:                                                                                                                     No current facility-administered medications for this encounter.   Current Outpatient Prescriptions  Medication Sig Dispense Refill  . acetaminophen (TYLENOL) 325 MG tablet Take 2 tablets (650 mg total) by mouth every 4 (four) hours as needed for fever, headache or mild pain.    Marland Kitchen albuterol (PROVENTIL HFA;VENTOLIN HFA) 108 (90 BASE) MCG/ACT inhaler Inhale 2 puffs into the lungs every 6 (six) hours as needed for wheezing or shortness of breath. 1 Inhaler 0  . amLODipine (NORVASC) 10 MG tablet Take 1 tablet (10 mg total) by mouth daily. 30 tablet 2  . aspirin EC 81 MG tablet Take 1 tablet (81 mg total) by mouth daily.    Marland Kitchen atorvastatin (LIPITOR) 80 MG tablet Take 1 tablet (80 mg total) by mouth daily. 30 tablet 2  . buPROPion (WELLBUTRIN XL) 150 MG 24 hr tablet  Take 1 tablet (150 mg total) by mouth daily. For 3 days, then increase '300mg'$  (2 tablets) daily (Patient taking differently: Take 150 mg by mouth 2 (two) times daily. ) 60 tablet 0  . carvedilol (COREG) 25 MG tablet Take 1 tablet (25 mg total) by mouth 2 (two) times daily with a meal. ONE TABLET IN AM ONE TABLET IN PM 60 tablet 6  . cyclobenzaprine (FLEXERIL) 10 MG tablet TAKE 1 TABLET BY MOUTH 3 TIMES DAILY AS NEEDED FOR MUSCLE SPASMS 70 tablet 0  . furosemide (LASIX) 40 MG tablet Take 1 tablet (40 mg total) by mouth daily. 30 tablet 6  . nitroGLYCERIN (NITROSTAT) 0.4 MG SL tablet Place 1 tablet (0.4 mg total) under the tongue every 5 (five) minutes x 3 doses as needed for chest pain. 25 tablet 12  . potassium chloride SA (K-DUR,KLOR-CON) 20 MEQ tablet Take 1 tablet (20 mEq total) by mouth daily. 30 tablet 6  . traMADol (ULTRAM) 50 MG tablet Take 1 tablet (50 mg total) by mouth every 8 (eight) hours as needed. 60 tablet 0  . traZODone (DESYREL) 100 MG tablet Take 1 tablet (100 mg total) by mouth at bedtime as needed for sleep. 30 tablet 2  . [DISCONTINUED] simvastatin (ZOCOR) 40 MG tablet Take 40 mg by mouth at bedtime.         ROS:  History obtained from the patient  General ROS: negative for - chills, fatigue, fever, night sweats, weight gain or weight loss Psychological ROS: negative for - behavioral disorder, hallucinations, memory difficulties, mood swings or suicidal ideation Ophthalmic ROS: negative for - blurry vision, double vision, eye pain or loss of vision ENT ROS: negative for - epistaxis, nasal discharge, oral lesions, sore throat, tinnitus or vertigo Allergy and Immunology ROS: negative for - hives or itchy/watery eyes Hematological and Lymphatic ROS: negative for - bleeding problems, bruising or swollen lymph nodes Endocrine ROS: negative for -  galactorrhea, hair pattern changes, polydipsia/polyuria or temperature intolerance Respiratory ROS: negative for - cough, hemoptysis, shortness of breath or wheezing Cardiovascular ROS: negative for - chest pain, dyspnea on exertion, edema or irregular heartbeat Gastrointestinal ROS: negative for - abdominal pain, diarrhea, hematemesis, nausea/vomiting or stool incontinence Genito-Urinary ROS: negative for - dysuria, hematuria, incontinence or urinary frequency/urgency Musculoskeletal ROS: negative for - joint swelling or muscular weakness Neurological ROS: as noted in HPI Dermatological ROS: negative for rash and skin lesion changes   Blood pressure 150/57, pulse 36, temperature 97.6 F (36.4 C), resp. rate 18, height 6' 1.5" (1.867 m), weight 77.111 kg (170 lb), SpO2 96 %.   Neurologic Examination:                                                                                                      HEENT-  Normocephalic, no lesions, without obvious abnormality.  Normal external eye and conjunctiva.  Normal TM's bilaterally.  Normal auditory canals and external ears. Normal external nose, mucus membranes and septum.  Normal pharynx. Cardiovascular- S1, S2 normal, pulses palpable throughout   Lungs- chest clear, no wheezing, rales, normal symmetric air entry Abdomen- normal findings: bowel sounds normal Extremities- no edema Lymph-no adenopathy palpable Musculoskeletal-no joint tenderness, deformity or swelling Skin-warm and dry, no hyperpigmentation, vitiligo, or suspicious lesions  Neurological Examination Mental Status: Alert, oriented to date, year, president, hospital, recent events and able to spell WORLD backwards with no difficulty.  thought content appropriate.  Speech fluent without evidence of aphasia.  Able to follow 3 step commands without difficulty. Cranial Nerves: II: Discs flat bilaterally; Visual fields grossly normal, pupils equal, round, reactive to light and  accommodation III,IV, VI: ptosis not present, extra-ocular motions intact bilaterally V,VII: smile symmetric, facial light touch sensation normal bilaterally VIII: hearing normal bilaterally IX,X: uvula rises symmetrically XI: bilateral shoulder shrug XII: midline tongue extension Motor: Right : Upper extremity   5/5    Left:     Upper extremity   5/5  Lower extremity   5/5     Lower extremity   5/5 Tone and bulk:normal tone throughout; no atrophy noted Sensory: Pinprick and light touch intact throughout, bilaterally Deep Tendon Reflexes: 2+ and symmetric throughout UE 1+ bilateral KJ and no AJ Plantars: Right: downgoing   Left: downgoing Cerebellar: normal finger-to-nose,and normal heel-to-shin test Gait: not tested due to safety      Lab Results: Basic Metabolic Panel:  Recent Labs Lab 11/25/14 0850 11/28/14 0753  NA 135 133*  K 4.4  3.7  CL 101 94*  CO2 24 28  GLUCOSE 111* 98  BUN 14 19  CREATININE 1.19* 1.27*  CALCIUM 9.6 9.6    Liver Function Tests:  Recent Labs Lab 11/25/14 0850  AST 15  ALT 12*  ALKPHOS 133*  BILITOT 0.7  PROT 7.6  ALBUMIN 4.0   No results for input(s): LIPASE, AMYLASE in the last 168 hours. No results for input(s): AMMONIA in the last 168 hours.  CBC:  Recent Labs Lab 11/25/14 0850 11/28/14 0753  WBC 11.8* 11.2*  NEUTROABS  --  7.5  HGB 13.3 12.8  HCT 40.6 39.1  MCV 79.8 78.7  PLT 136* 122*    Cardiac Enzymes: No results for input(s): CKTOTAL, CKMB, CKMBINDEX, TROPONINI in the last 168 hours.  Lipid Panel: No results for input(s): CHOL, TRIG, HDL, CHOLHDL, VLDL, LDLCALC in the last 168 hours.  CBG: No results for input(s): GLUCAP in the last 168 hours.  Microbiology: Results for orders placed or performed during the hospital encounter of 11/25/14  MRSA culture     Status: None   Collection Time: 11/25/14  8:49 AM  Result Value Ref Range Status   Specimen Description NASOPHARYNGEAL  Final   Special Requests NONE   Final   Culture NOMRSA Performed at [facility]   Final   Report Status 11/27/2014 FINAL  Final    Coagulation Studies: No results for input(s): LABPROT, INR in the last 72 hours.  Imaging: Dg Chest 2 View  11/28/2014   CLINICAL DATA:  Shortness of breath for 2 days.  EXAM: CHEST  2 VIEW  COMPARISON:  11/25/2014 and prior chest radiographs dating back to 01/16/2011  FINDINGS: The cardiomediastinal silhouette is unremarkable.  COPD/emphysema changes again identified.  There is no evidence of focal airspace disease, pulmonary edema, suspicious pulmonary nodule/mass, pleural effusion, or pneumothorax. No acute bony abnormalities are identified.  IMPRESSION: COPD/emphysema without evidence of acute cardiopulmonary disease.   Electronically Signed   By: Margarette Canada M.D.   On: 11/28/2014 08:32   Ct Head Wo Contrast  11/28/2014   CLINICAL DATA:  Dizziness, slurred speech, confusion. Impending aortic valve replacement surgery.  EXAM: CT HEAD WITHOUT CONTRAST  TECHNIQUE: Contiguous axial images were obtained from the base of the skull through the vertex without intravenous contrast.  COMPARISON:  None.  FINDINGS: Mild atrophy with diffuse sulcal prominence. Scattered calcifications within the bilateral basal ganglia. Gray-white differentiation is otherwise well maintained without CT evidence of acute large territory infarct. Exuberant calcifications about the midline falx. No intraparenchymal or extra-axial mass or hemorrhage. Normal size and configuration of the ventricles and basilar cisterns. No midline shift. Limited visualization of the paranasal sinuses and mastoid air cells is normal. No air-fluid levels. Regional soft tissues appear normal. No displaced calvarial fracture.  IMPRESSION: Mild atrophy without acute intracranial process.   Electronically Signed   By: Sandi Mariscal M.D.   On: 11/28/2014 08:49   Mr Brain Wo Contrast  11/28/2014   CLINICAL DATA:  64 year old female with altered speech  and mental status since Friday. Intermittent dizziness. Planned aortic valve replacement tomorrow. Initial encounter.  EXAM: MRI HEAD WITHOUT CONTRAST  TECHNIQUE: Multiplanar, multiecho pulse sequences of the brain and surrounding structures were obtained without intravenous contrast.  COMPARISON:  Head CT without contrast 0837 hours today.  FINDINGS: Incidental bulky dural calcifications along the interhemispheric fissure. No restricted diffusion to suggest acute infarction. No midline shift, mass effect, evidence of mass lesion, ventriculomegaly, extra-axial collection or acute intracranial  hemorrhage. Cervicomedullary junction and pituitary are within normal limits. Major intracranial vascular flow voids are within normal limits.  Scattered small bilateral cerebral white matter T2 and FLAIR hyperintense foci, mostly subcortical. The configuration is nonspecific an the extent is moderate for age. No cortical encephalomalacia or chronic cerebral blood products. Deep gray matter nuclei, brainstem, cerebellum, and visualized internal auditory structures appear normal.  Paranasal sinuses and mastoids are clear. Negative orbits soft tissues. Negative scalp soft tissues. Normal bone marrow signal. Negative visualized cervical spine.  IMPRESSION: 1.  No acute intracranial abnormality. 2. Moderate for age nonspecific cerebral white matter signal changes, most commonly due to chronic small vessel disease.   Electronically Signed   By: Genevie Ann M.D.   On: 11/28/2014 09:32       Assessment and plan per attending neurologist  Etta Quill PA-C [facility] [phone]  11/28/2014, 10:21 AM   Assessment/Plan:  64 YO female with 3 days of intermittent difficulty recalling the days and having delayed verbal out put. MRI brain showed no acute intracranial abnormality. UA is pending.  Exam shows no focal findings.  At this point no significant finding for her recent issue.  Will obtain TSH, B12, RPR,  Folate, EEG and Ammonia to evaluate for correctable causes.    Jim Like, DO [facility] [phone]  If 7pm- 7am, please page neurology on call as listed in [place].

## 2014-11-28 NOTE — Progress Notes (Signed)
EEG Completed; Results Pending  

## 2014-11-28 NOTE — Progress Notes (Signed)
Patient ID: Leah [name], female   DOB: Jul 12, 1950, 64 y.o.   MRN: [id number]   Informed of patient's admission for confusion, transient focal weakness.  Leah [name] is a 64 yo woman with CAAD, MR and AI. She was scheduled for an AVR, MVR, CABG with Dr. Servando Snare tomorrow morning.  She came to the ED this morning after her family noted she was confused. She was having trouble remembering what day it was and this morning had slurred speech and some left leg weakness both of which have now resolved. She says she has been having problems with her balance and "walking like a drunk person for 3 or 4 days."  Her head CT was negative for acute infarct.  She currently is alert and oriented to person, place and time. Her neurologic exam is nonfocal. Her cardiac exam is remarkable for a holosystolic mumur and a faint diastolic murmur as well.  A neurologic work up is underway.  I do not think it would be wise to proceed with a complex cardiac operation under these conditions. I discussed this with Leah [name] and Dr. Servando Snare.  I have cancelled her surgery scheduled for tomorrow. Dr. Servando Snare will reschedule once issues resolved.  Leah Standard Roxan Hockey, MD [facility] [phone]

## 2014-11-29 ENCOUNTER — Other Ambulatory Visit: Payer: Self-pay | Admitting: *Deleted

## 2014-11-29 ENCOUNTER — Inpatient Hospital Stay (HOSPITAL_COMMUNITY)
Admission: RE | Admit: 2014-11-29 | Payer: No Typology Code available for payment source | Source: Ambulatory Visit | Admitting: Cardiothoracic Surgery

## 2014-11-29 ENCOUNTER — Encounter (HOSPITAL_COMMUNITY): Admission: RE | Payer: Self-pay | Source: Ambulatory Visit

## 2014-11-29 LAB — BASIC METABOLIC PANEL
ANION GAP: 9 (ref 5–15)
BUN: 12 mg/dL (ref 6–20)
CALCIUM: 9.6 mg/dL (ref 8.9–10.3)
CO2: 28 mmol/L (ref 22–32)
CREATININE: 1.12 mg/dL — AB (ref 0.44–1.00)
Chloride: 101 mmol/L (ref 101–111)
GFR, EST NON AFRICAN AMERICAN: 52 mL/min — AB (ref >60)
GLUCOSE: 103 mg/dL — AB (ref 65–99)
Potassium: 3.6 mmol/L (ref 3.5–5.1)
Sodium: 138 mmol/L (ref 135–145)

## 2014-11-29 LAB — RPR: RPR Ser Ql: NONREACTIVE

## 2014-11-29 SURGERY — REPLACEMENT, AORTIC VALVE, OPEN
Anesthesia: General | Site: Chest

## 2014-11-29 MED ORDER — FUROSEMIDE 20 MG PO TABS
20.0000 mg | ORAL_TABLET | Freq: Every day | ORAL | Status: DC
  Administered 2014-11-30: 20 mg via ORAL
  Filled 2014-11-29: qty 1

## 2014-11-29 MED ORDER — ASPIRIN EC 81 MG PO TBEC
81.0000 mg | DELAYED_RELEASE_TABLET | Freq: Every day | ORAL | Status: DC
  Administered 2014-11-30: 81 mg via ORAL
  Filled 2014-11-29: qty 1

## 2014-11-29 NOTE — Progress Notes (Signed)
Family Medicine Teaching Service Daily Progress Note Intern Pager: [phone]  Patient name: Leah [name] [facility] Medical record number: [id number] Date of birth: Jul 29, 1950 Age: 64 y.o. Gender: female  Primary Care Provider: Kathrine Cords, MD Consultants: Neurology, Cardiothoracic Surgery Code Status: FULL  Pt Overview and Major Events to Date:  9/5: Admitted with ?confusion x1 week, bilateral lower extremity weakness/dizziness/slurred speech x5 days. CT, MRI, EEG without abnormality. 9/6: Patient with complete return to baseline.  Assessment and Plan:  Confusion and Weakness: Patient completely back to her baseline this morning. MRI brain negative for acute findings, EEG negative for seizure activity and telemetry WNL persistently overnight.  Vitamin B12, ammonia, folate WNL. Differential diagnosis includes TIA in the setting of discontinuation of Plavix prior to surgery vs. cessation of tobacco with start of Wellbutrin. - Neurology previously following, has signed off. Appreciate recommendations. - Wellbutrin 300 mg daily dose on admission decreased to Wellbutrin 150 mg daily this morning. Will continue to taper off. - F/U carotid dopplers today - PT/OT/SLP consulted, F/U recommendations - Serum RPR pending - ASA 81 mg daily for stroke prevention - Atorvastatin 80 mg daily  CAD/Valvular Disease: Patient initially scheduled today (9/6) for AVR, MVR, and GABG with Dr. Servando Snare. Spoke with Dr. Servando Snare, who would like Cardiology consult prior to proceeding with surgery. - Cardiology consult today - Will continue to hold Plavix pending Cardiology recommendations, surgical planning per Cardiothoracic Surgery.  HTN: Patient with mildly elevated BPs upon admission; normotensive overnight on home regimen of amlodipine 10 mg daily, Coreg 25 mg daily, and Lasix 40 mg PO daily. - Continue to monitor - If BPs elevated again, can give PRN hydralazine (HRs 36-72 since admission, thus will defer  increasing BB)  Chronic Diastolic Heart Failure: No evidence of fluid overload on exam at admission or this morning. - Continuing home Lasix as above and K-DUR 20 mg daily - Continue to monitor  Smoking Cessation: Patient has stopped smoking, on Wellbutrin 300 mg daily prior to admission. - Wellbutrin 150 mg today, will taper off as above  PVD: Stable.  Pulmonary Nodule/COPD: Respiratory status stable overnight. Severe diffusion defect on PFTs. Nodule noted on CXR 10/27/14, thought to be postinfectious. - F/U chest CT 01/2015 - Albuterol PRN  FEN/GI: Heart Healthy Diet, IV Saline Lock PPx: SCDs in place  Disposition: Discharge pending Cardiology consult, Cardiothoracic surgery operative planning.  Subjective: Overnight, patient with complete return to her baseline. She denies further confusion, disorientation, and word finding difficulty. Ambulated well in the hall this morning without dizziness or leg weakness. Denies headache, visual changes.  Objective: Temp:  [97.7 F (36.5 C)-98.3 F (36.8 C)] 98 F (36.7 C) (09/06 0718) Pulse Rate:  [36-72] 70 (09/06 0718) Resp:  [11-19] 16 (09/06 0718) BP: (115-160)/(44-114) 132/61 mmHg (09/06 0718) SpO2:  [95 %-100 %] 99 % (09/06 0718) Weight:  [77.202 kg (170 lb 3.2 oz)] 77.202 kg (170 lb 3.2 oz) (09/05 1308)  Physical Exam: General: Sitting up comfortably in bed, in no acute distress. Cardiovascular: Regular rate and rhythm. 2/6 holosystolic murmur at the R upper sternal border and L upper sternal border. Radial pulses 2+ bilaterally. Respiratory: Lungs clear to auscultation bilaterally without crackle/wheeze. Normal work of breathing on room air. Abdomen: Soft, non-tender and non-distended. Normoactive bowel sounds throughout. No palpable masses or hepatosplenomegaly appreciated. Extremities: Warm and well-perfused without edema or erythema. SCDs in place. Neurological: Alert and oriented to person, place and time. Visual fields  grossly normal. EOMI. PERRLA. Smile symmetric, facial sensation intact to  light touch bilaterally. Tone grossly normal. Strength 5/5 in the upper and lower extremities bilaterally. Speech fluent and intact without word finding difficulties or aphasia. Psych: Pleasant and appropriate. Anxious for surgery planning.  Laboratory:  Recent Labs Lab 11/25/14 0850 11/28/14 0753  WBC 11.8* 11.2*  HGB 13.3 12.8  HCT 40.6 39.1  PLT 136* 122*    Recent Labs Lab 11/25/14 0850 11/28/14 0753 11/28/14 1030 11/29/14 0509  NA 135 133* 136 138  K 4.4 3.7 3.7 3.6  CL 101 94* 99* 101  CO2 '24 28 28 28  '$ BUN '14 19 18 12  '$ CREATININE 1.19* 1.27* 1.13* 1.12*  CALCIUM 9.6 9.6 9.7 9.6  PROT 7.6  --  7.2  --   BILITOT 0.7  --  0.6  --   ALKPHOS 133*  --  130*  --   ALT 12*  --  12*  --   AST 15  --  16  --   GLUCOSE 111* 98 98 103*   Imaging/Diagnostic Tests:  EEG (9/5): No abnormalities. Normal EEG; no seizure or seizure predisposition recorded.  Orion Crook, Med Student 11/29/2014, 8:37 AM MS4, [place] Intern pager: [phone], text pages welcome  RESIDENT ADDENDUM  I have separately seen and examined the patient. I have discussed the findings and exam with the medical student and agree with the above note, which I have edited appropriately. I helped develop the management plan that is described in the student's note, and I agree with the content.  Additionally I have outlined my exam and assessment/plan below:   PE:  Blood pressure 132/61, pulse 70, temperature 97.6 F (36.4 C), temperature source Oral, resp. rate 16, height 6' 1.5" (1.867 m), weight 170 lb 3.2 oz (77.202 kg), SpO2 100 %. General: Sitting up in bed in NAD. Smiling Cardiovascular: RRR. II/VI holosystolic murmur. No pitting edema noted. Respiratory: No increased WOB. CTAB without wheezing, rhonchi, or crackles noted. Neuro: A&O x4. Speech clear with a normal rate.  EOMI without nystagmus. Uvula and tongue  midline. Facial movements symmetric. Sensation over the face intact/equal. Shoulder shrug intact bilaterally. 5/5 strength in the upper extremities and lower extremities bilaterally. Sensation intact bilaterally in the upper and lower extremities. Normal rapid alternating movements. Finger to nose normal. No pronator drift Normal DTRs. Downgoing Babinski bilaterally.  Psych: Appropriate mood and affect.   A/P:  Leah [name] is a 64 y.o. female presenting with confusion, slurred speech, and leg weakness. PMH is significant for severe mitral regurgitation, aortic insufficiency, CAD, PVD, HLD, HTN, tobacco abuse (stopped recently), COPD, diastolic CHF  Weakness, confusion, and slurred speech: Patient has a h/o a left carotid enterectomy in 10/2007. CT and MRI on admission without acute findings. Physical exam at baseline today. Given patient's extensive vascular history, this could be secondary to a TIA however time frame (days) seems less consistent. Has been on Plavix however discontinued this in preparation for aortic, mitral valve replacement and CABG scheduled for 9/6 (has been canceled). Of note, the patient was started on Wellbutrin approximately 3-4 weeks ago for assistance with tobacco cessation: this could be causing some of her confusion and weakness  - continue to monitor on telemetry - Neurology signed off - TSH, vitamin B12, folate, and ammonia normal. BUN normal, EEG normal - carotid dopplers ordered, still note done - Have gone down on Wellbutrin '150mg'$  daily to see if this improves (do not want to abruptly discontinue).  - Given 1st event occurred from getting up  out of bed, will get orthostatics  CAD: s/p PCI with stenting of the mid LAD 08/2007. Cardiac cath done 08/24/14 revealed distal stent restenosis of 70% that was not felt to be hemodynamically significant and the plan is for medical treatment. She was seen then seen in follow up 09/07/14 and was doing well. An echo done  during then showed an EF of 60-65%, with moderate to severe MR, and mild to moderate AR, and grade 2 diastolic dysfunction.Most recent TEE on 10/24/2014 shoed LVEF to 55-60%, moderate AI, severe MR, moderately calcified aorta, and no evidence of thrombus in LA. TEE is suggestive of rheumatic of valve disease. EKG on admission stable.  - cardiothoracic surgery consulted as she was supposed to have an AVR, MVR, CABG with Dr. Servando Snare tomorrow morning. - Dr. Servando Snare instructed the team to contact his nurse, Thurmond Butts, at the office to determine when/if we should restart Plavix - Surgery will not be this week as Dr. Everrett Coombe schedule is booked.  - Left message with Levonne Spiller, awaiting call back  HTN: On amlodipine, Coreg, and Lasix at home. H/o angioedema therefore no ACE-inhibitors or ARBs.  - BPs slightly elevated on admission to the 140s/60s, will continue to monitor - If needed, can give PRN hydralazine (HRs in 50-70s therefore I'd defer increasing beta blocker) - orthostatics as above.  Chronic diastolic heart failure: No evidence of fluid overload on exam. - Continue home lasix and KDUR - continue to monitor   Smoking cessation: Patient has stopped smoking - Wellbutrin '150mg'$  (down from '300mg'$  to assess if this makes a difference in her confusion).   Archie Patten, MD PGY-2,  [facility] 11/29/2014  12:40 PM

## 2014-11-29 NOTE — Progress Notes (Signed)
Occupational Therapy Evaluation Patient Details Name: Leah [name] MRN: [id number] DOB: 1950-11-19 Today's Date: 11/29/2014    History of Present Illness 64 y.o. F with pmx of CAD,HTN,HLD,PVC,dCHF, Aortic valve regurgitation, mitral valve regurgitation presented with hx of confusion which started about 1 wk ago,she will forget reason why she went into the kitchen off and she also has word finding difficulty. This is associated with occasional headache. She felt weak in her legs and recently fell at home. She denied any sensory loss. She noticed slurring of her speech as well but no facial asymmetry. MRI negative for infarct.   Clinical Impression   Education provided in session. OT signing off.    Follow Up Recommendations  No OT follow up    Equipment Recommendations  None recommended by OT    Recommendations for Other Services       Precautions / Restrictions Restrictions Weight Bearing Restrictions: No      Mobility Bed Mobility Overal bed mobility: Modified Independent                Transfers Overall transfer level: Independent                    Balance    Pt with slight unsteadiness with single leg stance when simulating LB bathing. Able to bend over and pick up item on floor with no LOB.                                        ADL Overall ADL's : Needs assistance/impaired             Lower Body Bathing: Supervison/ safety (standing)       Lower Body Dressing: Modified independent;Sit to/from stand   Toilet Transfer: Modified Independent;Ambulation (sit to stand from bed-independent)           Functional mobility during ADLs: Modified independent General ADL Comments: Educated on BE FAST stroke education and advised to continue stop smoking, as pt recently quit. Educated on safety such as sitting for LB ADLs and discussed what she could use for shower chair or could sit on side of tub. Recommended someone be with  her for tub transfer first couple times.      Vision Pt wears reading glasses; reports no change from baseline Vision Assessment?: Yes Visual Fields: No apparent deficits Additional Comments: pt reports no difficulty with reading   Perception     Praxis      Pertinent Vitals/Pain Pain Assessment: No/denies pain     Hand Dominance     Extremity/Trunk Assessment Upper Extremity Assessment Upper Extremity Assessment: Overall WFL for tasks assessed   Lower Extremity Assessment Lower Extremity Assessment: Overall WFL for tasks assessed;Defer to PT evaluation       Communication Communication Communication: No difficulties   Cognition Arousal/Alertness: Awake/alert Behavior During Therapy: WFL for tasks assessed/performed Overall Cognitive Status: Within Functional Limits for tasks assessed                     General Comments       Exercises       Shoulder Instructions      Home Living Family/patient expects to be discharged to:: Private residence Living Arrangements: Children;Other relatives Available Help at Discharge: Family;Available 24 hours/day Type of Home: Apartment Home Access: Stairs to enter Entrance Stairs-Number of Steps: 6 Entrance Stairs-Rails: Right Home Layout:  Two level Alternate Level Stairs-Number of Steps: 8 Alternate Level Stairs-Rails: Left Bathroom Shower/Tub: Teacher, early years/pre: Standard     Home Equipment: Bedside commode          Prior Functioning/Environment Level of Independence: Independent             OT Diagnosis: Other (comment) (admitted for memory difficulty/confusion)   OT Problem List:     OT Treatment/Interventions:      OT Goals(Current goals can be found in the care plan section) Acute Rehab OT Goals Patient Stated Goal: get back to [facility] and have her surgery  OT Frequency:     Barriers to D/C:            Co-evaluation              End of Session Equipment  Utilized During Treatment: Gait belt  Activity Tolerance: Patient tolerated treatment well Patient left: in bed;with call bell/phone within reach   Time: 0818-0832 OT Time Calculation (min): 14 min Charges:  OT General Charges $OT Visit: 1 Procedure OT Evaluation $Initial OT Evaluation Tier I: 1 Procedure G-CodesBenito Mccreedy OTR/L [id number] 11/29/2014, 9:30 AM

## 2014-11-29 NOTE — Discharge Instructions (Addendum)
Do NOT re-start Plavix. Take aspirin '81mg'$  daily. STOP taking Wellbutrin. Decrease Lasix to '20mg'$  daily. Your baseline weight is 170 pounds here. Weight yourself on your scale to make sure your scale is the same as ours. If your weight is 172-173 pounds, please take an additional '20mg'$  of Lasix (for a total of '40mg'$ ).  STROKE/TIA DISCHARGE INSTRUCTIONS SMOKING Cigarette smoking nearly doubles your risk of having a stroke & is the single most alterable risk factor  If you smoke or have smoked in the last 12 months, you are advised to quit smoking for your health.  Most of the excess cardiovascular risk related to smoking disappears within a year of stopping.  Ask you doctor about anti-smoking medications  [place] Quit Line: 1-800-QUIT NOW  Free Smoking Cessation Classes [phone]  CHOLESTEROL Know your levels; limit fat & cholesterol in your diet  Lipid Panel     Component Value Date/Time   CHOL 181 08/24/2014 0538   TRIG 88 08/24/2014 0538   HDL 33* 08/24/2014 0538   CHOLHDL 5.5 08/24/2014 0538   VLDL 18 08/24/2014 0538   LDLCALC 130* 08/24/2014 0538      Many patients benefit from treatment even if their cholesterol is at goal.  Goal: Total Cholesterol (CHOL) less than 160  Goal:  Triglycerides (TRIG) less than 150  Goal:  HDL greater than 40  Goal:  LDL (LDLCALC) less than 100   BLOOD PRESSURE American Stroke Association blood pressure target is less that 120/80 mm/Hg  Your discharge blood pressure is:  BP: 132/61 mmHg  Monitor your blood pressure  Limit your salt and alcohol intake  Many individuals will require more than one medication for high blood pressure  DIABETES (A1c is a blood sugar average for last 3 months) Goal HGBA1c is under 7% (HBGA1c is blood sugar average for last 3 months)  Diabetes: No known diagnosis of diabetes    Lab Results  Component Value Date   HGBA1C 6.1* 11/25/2014     Your HGBA1c can be lowered with medications, healthy diet, and  exercise.  Check your blood sugar as directed by your physician  Call your physician if you experience unexplained or low blood sugars.  PHYSICAL ACTIVITY/REHABILITATION Goal is 30 minutes at least 4 days per week  Activity: No restrictions. Therapies: Physical Therapy: none Return to work: d/c home   Activity decreases your risk of heart attack and stroke and makes your heart stronger.  It helps control your weight and blood pressure; helps you relax and can improve your mood.  Participate in a regular exercise program.  Talk with your doctor about the best form of exercise for you (dancing, walking, swimming, cycling).  DIET/WEIGHT Goal is to maintain a healthy weight  Your discharge diet is: Diet Heart Room service appropriate?: Yes; Fluid consistency:: Thin   liquids Your height is:  Height: 6' 1.5" (186.7 cm) Your current weight is: Weight: 77.202 kg (170 lb 3.2 oz) Your Body Mass Index (BMI) is:  BMI (Calculated): 22.2  Following the type of diet specifically designed for you will help prevent another stroke.  Your goal weight range is:  144 - 182  Your goal Body Mass Index (BMI) is 19-24.  Healthy food habits can help reduce 3 risk factors for stroke:  High cholesterol, hypertension, and excess weight.  RESOURCES Stroke/Support Group:  Call [phone]   STROKE EDUCATION PROVIDED/REVIEWED AND GIVEN TO PATIENT Stroke warning signs and symptoms How to activate emergency medical system (call 911). Medications prescribed  at discharge. Need for follow-up after discharge. Personal risk factors for stroke. Pneumonia vaccine given: No Flu vaccine given: Yes, Date 11/29/2014 My questions have been answered, the writing is legible, and I understand these instructions.  I will adhere to these goals & educational materials that have been provided to me after my discharge from the hospital.

## 2014-11-29 NOTE — Progress Notes (Signed)
[address]       [place] [address]             [phone]                      LOS: 1 day   Subjective: Patient was to have AVR,MVR and CABG today. She noted episodes of trouble speaking, confusion and "falling" back. Neurology has seen her with little to add. Today see seems to be back to normal status today. Has been off plavix for 5-6 days prior to surgery.  Objective: Vital signs in last 24 hours: Patient Vitals for the past 24 hrs:  BP Temp Temp src Pulse Resp SpO2  11/29/14 1130 - 97.6 F (36.4 C) Oral - - 100 %  11/29/14 0718 132/61 mmHg 98 F (36.7 C) Oral 70 16 99 %  11/29/14 0520 (!) 125/59 mmHg 97.9 F (36.6 C) Oral 62 - 98 %  11/29/14 0320 138/64 mmHg - - (!) 41 - 97 %  11/29/14 0119 (!) 115/56 mmHg 98.3 F (36.8 C) Oral 65 - 98 %  11/28/14 2007 134/61 mmHg 98 F (36.7 C) Oral 64 - 100 %  11/28/14 1735 - 97.7 F (36.5 C) Oral - 16 100 %    Filed Weights   11/28/14 0632 11/28/14 1308  Weight: 170 lb (77.111 kg) 170 lb 3.2 oz (77.202 kg)    Hemodynamic parameters for last 24 hours:    Intake/Output from previous day: 09/05 0701 - 09/06 0700 In: 120 [P.O.:120] Out: -  Intake/Output this shift: Total I/O In: 480 [P.O.:480] Out: 200 [Urine:200]  Scheduled Meds: . amLODipine  10 mg Oral Daily  . atorvastatin  80 mg Oral Daily  . buPROPion  150 mg Oral Daily  . carvedilol  25 mg Oral BID WC  . [START ON 11/30/2014] furosemide  20 mg Oral Daily  . potassium chloride SA  20 mEq Oral Daily   Continuous Infusions:  PRN Meds:.acetaminophen, albuterol, cyclobenzaprine, senna-docusate, traMADol, traZODone  General appearance: alert and cooperative Neurologic: intact Heart: diastolic murmur: holodiastolic 3/6, decrescendo at lower left sternal border Lungs: clear to auscultation bilaterally Abdomen: soft, non-tender; bowel sounds normal; no masses,  no organomegaly Extremities: extremities normal, atraumatic, no cyanosis or edema  and Homans sign is negative, no sign of DVT   Lab Results: CBC: Recent Labs  11/28/14 0753  WBC 11.2*  HGB 12.8  HCT 39.1  PLT 122*   BMET:  Recent Labs  11/28/14 1030 11/29/14 0509  NA 136 138  K 3.7 3.6  CL 99* 101  CO2 28 28  GLUCOSE 98 103*  BUN 18 12  CREATININE 1.13* 1.12*  CALCIUM 9.7 9.6    PT/INR: No results for input(s): LABPROT, INR in the last 72 hours.   Radiology Dg Chest 2 View  11/28/2014   CLINICAL DATA:  Shortness of breath for 2 days.  EXAM: CHEST  2 VIEW  COMPARISON:  11/25/2014 and prior chest radiographs dating back to 01/16/2011  FINDINGS: The cardiomediastinal silhouette is unremarkable.  COPD/emphysema changes again identified.  There is no evidence of focal airspace disease, pulmonary edema, suspicious pulmonary nodule/mass, pleural effusion, or pneumothorax. No acute bony abnormalities are identified.  IMPRESSION: COPD/emphysema without evidence of acute cardiopulmonary disease.   Electronically Signed   By: Margarette Canada M.D.   On: 11/28/2014 08:32   Ct Head Wo Contrast  11/28/2014   CLINICAL DATA:  Dizziness, slurred speech, confusion.  Impending aortic valve replacement surgery.  EXAM: CT HEAD WITHOUT CONTRAST  TECHNIQUE: Contiguous axial images were obtained from the base of the skull through the vertex without intravenous contrast.  COMPARISON:  None.  FINDINGS: Mild atrophy with diffuse sulcal prominence. Scattered calcifications within the bilateral basal ganglia. Gray-white differentiation is otherwise well maintained without CT evidence of acute large territory infarct. Exuberant calcifications about the midline falx. No intraparenchymal or extra-axial mass or hemorrhage. Normal size and configuration of the ventricles and basilar cisterns. No midline shift. Limited visualization of the paranasal sinuses and mastoid air cells is normal. No air-fluid levels. Regional soft tissues appear normal. No displaced calvarial fracture.  IMPRESSION: Mild atrophy  without acute intracranial process.   Electronically Signed   By: Sandi Mariscal M.D.   On: 11/28/2014 08:49   Mr Brain Wo Contrast  11/28/2014   CLINICAL DATA:  64 year old female with altered speech and mental status since Friday. Intermittent dizziness. Planned aortic valve replacement tomorrow. Initial encounter.  EXAM: MRI HEAD WITHOUT CONTRAST  TECHNIQUE: Multiplanar, multiecho pulse sequences of the brain and surrounding structures were obtained without intravenous contrast.  COMPARISON:  Head CT without contrast 0837 hours today.  FINDINGS: Incidental bulky dural calcifications along the interhemispheric fissure. No restricted diffusion to suggest acute infarction. No midline shift, mass effect, evidence of mass lesion, ventriculomegaly, extra-axial collection or acute intracranial hemorrhage. Cervicomedullary junction and pituitary are within normal limits. Major intracranial vascular flow voids are within normal limits.  Scattered small bilateral cerebral white matter T2 and FLAIR hyperintense foci, mostly subcortical. The configuration is nonspecific an the extent is moderate for age. No cortical encephalomalacia or chronic cerebral blood products. Deep gray matter nuclei, brainstem, cerebellum, and visualized internal auditory structures appear normal.  Paranasal sinuses and mastoids are clear. Negative orbits soft tissues. Negative scalp soft tissues. Normal bone marrow signal. Negative visualized cervical spine.  IMPRESSION: 1.  No acute intracranial abnormality. 2. Moderate for age nonspecific cerebral white matter signal changes, most commonly due to chronic small vessel disease.   Electronically Signed   By: Genevie Ann M.D.   On: 11/28/2014 09:32     Assessment/Plan: Will Delay Surgery until next week Consider  D/c Wellbutrin as poss cause mental changes  My office will contact her about surgery time     [name] MD 11/29/2014 3:00 PM

## 2014-11-29 NOTE — Progress Notes (Signed)
Utilization review completed. Matin Mattioli, RN, BSN. 

## 2014-11-29 NOTE — Progress Notes (Signed)
PT Cancellation Note  Patient Details Name: Leah [name] MRN: [id number] DOB: Aug 16, 1950   Cancelled Treatment:    Reason Eval/Treat Not Completed: PT screened, no needs identified, will sign off; Spoke with OT who reports patient ambulating without difficulty and no noted balance difficulties except in single limb stance.  Spoke with patient who also states no current issues with her mobility.  Will sign off.  Please reconsult should any needs arise.  Thanks   WYNN,CYNDI 11/29/2014, 10:16 AM  Magda Kiel, PT [phone] 11/29/2014

## 2014-11-29 NOTE — Discharge Summary (Signed)
[facility] Discharge Summary  Patient name: Leah [name] [facility] Medical record number: [id number] Date of birth: May 10, 1950 Age: 64 y.o. Gender: female Date of Admission: 11/28/2014  Date of Discharge: 11/29/2014 Admitting Physician: Kinnie Feil, MD  Primary Care Provider: Kathrine Cords, MD Consultants: Cardiothoracic Surgery, Cardiology, Neurology  Indication for Hospitalization: Confusion, bilateral lower extremity weakness, dizziness, slurred speech  Discharge Diagnoses/Problem List:  1. Confusion and weakness 2. CAD/Valvular Disease 3. HFpEF 4. HTN 5. Smoking Cessation 6. PVD 7. Pulmonary Nodule/COPD  Disposition: Discharge to home.  Discharge Condition: Stable.  Discharge Exam:  Blood pressure 125/63, pulse 54, temperature 97.9 F (36.6 C), temperature source Oral, resp. rate 18, height 6' 1.5" (1.867 m), weight 77.248 kg (170 lb 4.8 oz), SpO2 98 %.  General: Resting comfortably while sitting up in bed. Cardiovascular: Regular rate and rhythm. 2/6 holosystolic murmur over the R upper sternal border and L upper sternal border. Radial and DP pulses 2+ bilaterally. Respiratory: Lungs clear to auscultation bilaterally without crackle/wheeze. Normal work of breathing on room air. Abdomen: Soft, non-tender and non-distended without palpable masses or hepatosplenomegaly. Normoactive bowel sounds. Extremities: Warm and well-perfused without edema or erythema. SCDs in place. Neurological: Alert and oriented to person, place, and time. Speech fluent and intact without word finding difficulties or aphasia. Visual fields grossly normal. EOMI. Smile symmetric. Tone grossly normal. Gait intact, ambulating well. Psych: Pleasant and appropriate. Anxious for surgical scheduling.  Brief Hospital Course:  Terrilynn Postell is a very pleasant 64 yo woman with history of CAD, HFpEF, aortic and mitral valve regurgitation, HTN, HLD, and PVD who was admitted to [facility] with one week of confusion and five days of leg weakness with slurring of her speech. Please see hospital course by problem list below.  Confusion and Weakness: Patient presented with one week of confusion (word finding difficulty, forgetfulness), occasional headache, leg weakness with a recent fall, and slurring of her speech. Of note, home Plavix had been held x1 week in preparation for aortic and mitral valve replacement and CABG (scheduled initially 9/6). At admission, no focal neurological deficits were appreciated. CT Head and MRI Brain were without acute abnormality; EEG was without seizure activity. Vitamin B12, folate, TSH, and ammonia were WNL. Carotid dopplers were not repeated, as patient had this study on 10/25/14 with 1-39% bilateral stenosis. TIA was considered, but less likely considering extended time course. Likely diagnosis adverse reaction to Wellbutrin, which she was started on 3-4 weeks ago for smoking cessation. Wellbutrin decreased (9/5) from 300 mg daily to 150 mg daily dose, with following complete resolution of her symptoms. At discharge, she had returned completely to her baseline. Wellbutrin was discontinued completely on day of discharge. She was started on ASA 81 mg daily and atorvastatin 80 mg daily prior to discharge.  CAD/Valvular Disease: Patient initially scheduled for mitral valve replacement, aortic valve replacement, and CABG on 9/6 with Dr. Servando Snare; however, she was evaluated by Cardiothoracic Surgery on an inpatient basis and surgery was delayed pending neuro work-up. She was evaluated prior to discharge by Cardiology and Dr. Servando Snare, who recommended delaying surgery until the coming week. She was tentatively scheduled for the coming week and was discharged on ASA 81 mg (started 9/6), with home Plavix held until surgery.  HTN: Patient on amlodipine, Coreg, and Lasix prior to admission. Of note, orthostatics performed during this admission were positive.  Decreased home Lasix from 40 mg PO daily to 20 mg PO daily at discharge. Continue to  monitor on an outpatient basis, could consider backing off of amlodipine as well, considering patient with recent episode of lightheadedness while rising from bed that led to a fall.  COPD/Pulmonary Nodule: Severe diffusion defect on PFTs. Nodule noted on CXR 10/27/14, thought to be post-infectious. Follow up in 3-6 months (01/2015) with CT chest.  PVD: Stable during admission.  HFpEF: Stable, euvolemic during admission. Continued on Lasix '20mg'$  and instructed to monitor weights daily for signs/symptoms of fluid overload.   Issues for Follow Up:  1. Rescheduling of AVR, MVR, CABG with CT Surgery 2. F/U BP control and fluid status with decreased Lasix dose 3. F/U pulmonary nodule with CT chest 01/2015 4. F/U continued smoking abstinence after Wellbutrin is discontinued  Significant Procedures: None  Significant Labs and Imaging:   Recent Labs Lab 11/28/14 0753  WBC 11.2*  HGB 12.8  HCT 39.1  PLT 122*    Recent Labs Lab 11/28/14 0753 11/28/14 1030 11/29/14 0509 11/30/14 0430  NA 133* 136 138 137  K 3.7 3.7 3.6 3.7  CL 94* 99* 101 101  CO2 '28 28 28 26  '$ GLUCOSE 98 98 103* 97  BUN '19 18 12 15  '$ CREATININE 1.27* 1.13* 1.12* 1.09*  CALCIUM 9.6 9.7 9.6 9.5  ALKPHOS  --  130*  --   --   AST  --  16  --   --   ALT  --  12*  --   --   ALBUMIN  --  4.0  --   --     RPR: Non-reactive Ammonia: 17 Folate: 6.5 Vitamin B-12: 498 TSH: 1.756 BNP: 56.9  Results/Tests Pending at Time of Discharge: None  Discharge Medications:    Medication List    STOP taking these medications        buPROPion 150 MG 24 hr tablet  Commonly known as:  WELLBUTRIN XL      TAKE these medications        acetaminophen 325 MG tablet  Commonly known as:  TYLENOL  Take 2 tablets (650 mg total) by mouth every 4 (four) hours as needed for fever, headache or mild pain.     albuterol 108 (90 BASE) MCG/ACT inhaler   Commonly known as:  PROVENTIL HFA;VENTOLIN HFA  Inhale 2 puffs into the lungs every 6 (six) hours as needed for wheezing or shortness of breath.     amLODipine 10 MG tablet  Commonly known as:  NORVASC  Take 1 tablet (10 mg total) by mouth daily.     aspirin EC 81 MG tablet  Take 1 tablet (81 mg total) by mouth daily.     atorvastatin 80 MG tablet  Commonly known as:  LIPITOR  Take 1 tablet (80 mg total) by mouth daily.     carvedilol 25 MG tablet  Commonly known as:  COREG  Take 1 tablet (25 mg total) by mouth 2 (two) times daily with a meal. ONE TABLET IN AM ONE TABLET IN PM     cyclobenzaprine 10 MG tablet  Commonly known as:  FLEXERIL  TAKE 1 TABLET BY MOUTH 3 TIMES DAILY AS NEEDED FOR MUSCLE SPASMS     furosemide 40 MG tablet  Commonly known as:  LASIX  Take 0.5 tablets (20 mg total) by mouth daily.     nitroGLYCERIN 0.4 MG SL tablet  Commonly known as:  NITROSTAT  Place 1 tablet (0.4 mg total) under the tongue every 5 (five) minutes x 3 doses as needed for chest pain.  potassium chloride SA 20 MEQ tablet  Commonly known as:  K-DUR,KLOR-CON  Take 1 tablet (20 mEq total) by mouth daily.     traMADol 50 MG tablet  Commonly known as:  ULTRAM  Take 1 tablet (50 mg total) by mouth every 8 (eight) hours as needed.     traZODone 100 MG tablet  Commonly known as:  DESYREL  Take 1 tablet (100 mg total) by mouth at bedtime as needed for sleep.        Discharge Instructions: Please refer to Patient Instructions section of EMR for full details.  Patient was counseled important signs and symptoms that should prompt return to medical care, changes in medications, dietary instructions, activity restrictions, and follow up appointments.   Follow-Up Appointments: Follow-up Information    Follow up with Lockie Pares, MD On 12/06/2014.   Specialty:  Family Medicine   Why:  11:00am for hospital follow up   Contact information:   [place]  [id number] [phone]       Follow-up Information    Follow up with Lockie Pares, MD On 12/06/2014.   Specialty:  Family Medicine   Why:  11:00am for hospital follow up   Contact information:   [address] [place] [address] [phone]      Note written with the assistance of: Archie Patten, MD   I have made the appropriate modifications within the note and performed a separate physical exam which was documented in the progress note from the day of discharge.  Archie Patten, MD [facility] Family Medicine Resident

## 2014-11-29 NOTE — Progress Notes (Signed)
Subjective: Resting comfortably. No further episodes. MRI brain and EEG both unremarkable.   Objective: Current vital signs: BP 132/61 mmHg  Pulse 70  Temp(Src) 98 F (36.7 C) (Oral)  Resp 16  Ht 6' 1.5" (1.867 m)  Wt 77.202 kg (170 lb 3.2 oz)  BMI 22.15 kg/m2  SpO2 99% Vital signs in last 24 hours: Temp:  [97.7 F (36.5 C)-98.3 F (36.8 C)] 98 F (36.7 C) (09/06 0718) Pulse Rate:  [36-72] 70 (09/06 0718) Resp:  [11-21] 16 (09/06 0718) BP: (115-160)/(44-114) 132/61 mmHg (09/06 0718) SpO2:  [95 %-100 %] 99 % (09/06 0718) Weight:  [77.202 kg (170 lb 3.2 oz)] 77.202 kg (170 lb 3.2 oz) (09/05 1308)  Intake/Output from previous day: 09/05 0701 - 09/06 0700 In: 120 [P.O.:120] Out: -  Intake/Output this shift:   Nutritional status: Diet Heart Room service appropriate?: Yes; Fluid consistency:: Thin  Neurologic Exam: Mental Status: Alert, oriented to date, year, president, hospital. thought content appropriate. Speech fluent without evidence of aphasia. Cranial Nerves: II: Visual fields grossly normal, pupils equal, round, reactive to light III,IV, VI: ptosis not present, extra-ocular motions intact bilaterally V,VII: smile symmetric, facial light touch sensation normal bilaterally Motor: Right :Upper extremity 5/5Left: Upper extremity 5/5 Lower extremity 5/5Lower extremity 5/5 Tone and bulk:normal tone throughout; no atrophy noted Sensory: light touch intact throughout, bilaterally  Lab Results: Basic Metabolic Panel:  Recent Labs Lab 11/25/14 0850 11/28/14 0753 11/28/14 1030 11/29/14 0509  NA 135 133* 136 138  K 4.4 3.7 3.7 3.6  CL 101 94* 99* 101  CO2 '24 28 28 28  '$ GLUCOSE 111* 98 98 103*  BUN '14 19 18 12  '$ CREATININE 1.19* 1.27* 1.13* 1.12*  CALCIUM 9.6 9.6 9.7 9.6    Liver Function Tests:  Recent Labs Lab 11/25/14 0850 11/28/14 1030  AST  15 16  ALT 12* 12*  ALKPHOS 133* 130*  BILITOT 0.7 0.6  PROT 7.6 7.2  ALBUMIN 4.0 4.0   No results for input(s): LIPASE, AMYLASE in the last 168 hours.  Recent Labs Lab 11/28/14 1029  AMMONIA 17    CBC:  Recent Labs Lab 11/25/14 0850 11/28/14 0753  WBC 11.8* 11.2*  NEUTROABS  --  7.5  HGB 13.3 12.8  HCT 40.6 39.1  MCV 79.8 78.7  PLT 136* 122*    Cardiac Enzymes: No results for input(s): CKTOTAL, CKMB, CKMBINDEX, TROPONINI in the last 168 hours.  Lipid Panel: No results for input(s): CHOL, TRIG, HDL, CHOLHDL, VLDL, LDLCALC in the last 168 hours.  CBG: No results for input(s): GLUCAP in the last 168 hours.  Microbiology: Results for orders placed or performed during the hospital encounter of 11/25/14  MRSA culture     Status: None   Collection Time: 11/25/14  8:49 AM  Result Value Ref Range Status   Specimen Description NASOPHARYNGEAL  Final   Special Requests NONE  Final   Culture NOMRSA Performed at [facility]   Final   Report Status 11/27/2014 FINAL  Final    Coagulation Studies: No results for input(s): LABPROT, INR in the last 72 hours.  Imaging: Dg Chest 2 View  11/28/2014   CLINICAL DATA:  Shortness of breath for 2 days.  EXAM: CHEST  2 VIEW  COMPARISON:  11/25/2014 and prior chest radiographs dating back to 01/16/2011  FINDINGS: The cardiomediastinal silhouette is unremarkable.  COPD/emphysema changes again identified.  There is no evidence of focal airspace disease, pulmonary edema, suspicious pulmonary nodule/mass, pleural effusion, or pneumothorax. No acute  bony abnormalities are identified.  IMPRESSION: COPD/emphysema without evidence of acute cardiopulmonary disease.   Electronically Signed   By: Margarette Canada M.D.   On: 11/28/2014 08:32   Ct Head Wo Contrast  11/28/2014   CLINICAL DATA:  Dizziness, slurred speech, confusion. Impending aortic valve replacement surgery.  EXAM: CT HEAD WITHOUT CONTRAST  TECHNIQUE: Contiguous axial images were  obtained from the base of the skull through the vertex without intravenous contrast.  COMPARISON:  None.  FINDINGS: Mild atrophy with diffuse sulcal prominence. Scattered calcifications within the bilateral basal ganglia. Gray-white differentiation is otherwise well maintained without CT evidence of acute large territory infarct. Exuberant calcifications about the midline falx. No intraparenchymal or extra-axial mass or hemorrhage. Normal size and configuration of the ventricles and basilar cisterns. No midline shift. Limited visualization of the paranasal sinuses and mastoid air cells is normal. No air-fluid levels. Regional soft tissues appear normal. No displaced calvarial fracture.  IMPRESSION: Mild atrophy without acute intracranial process.   Electronically Signed   By: Sandi Mariscal M.D.   On: 11/28/2014 08:49   Mr Brain Wo Contrast  11/28/2014   CLINICAL DATA:  64 year old female with altered speech and mental status since Friday. Intermittent dizziness. Planned aortic valve replacement tomorrow. Initial encounter.  EXAM: MRI HEAD WITHOUT CONTRAST  TECHNIQUE: Multiplanar, multiecho pulse sequences of the brain and surrounding structures were obtained without intravenous contrast.  COMPARISON:  Head CT without contrast 0837 hours today.  FINDINGS: Incidental bulky dural calcifications along the interhemispheric fissure. No restricted diffusion to suggest acute infarction. No midline shift, mass effect, evidence of mass lesion, ventriculomegaly, extra-axial collection or acute intracranial hemorrhage. Cervicomedullary junction and pituitary are within normal limits. Major intracranial vascular flow voids are within normal limits.  Scattered small bilateral cerebral white matter T2 and FLAIR hyperintense foci, mostly subcortical. The configuration is nonspecific an the extent is moderate for age. No cortical encephalomalacia or chronic cerebral blood products. Deep gray matter nuclei, brainstem, cerebellum, and  visualized internal auditory structures appear normal.  Paranasal sinuses and mastoids are clear. Negative orbits soft tissues. Negative scalp soft tissues. Normal bone marrow signal. Negative visualized cervical spine.  IMPRESSION: 1.  No acute intracranial abnormality. 2. Moderate for age nonspecific cerebral white matter signal changes, most commonly due to chronic small vessel disease.   Electronically Signed   By: Genevie Ann M.D.   On: 11/28/2014 09:32    Medications:  Scheduled: . amLODipine  10 mg Oral Daily  . aspirin EC  81 mg Oral Daily  . atorvastatin  80 mg Oral Daily  . buPROPion  150 mg Oral Daily  . carvedilol  25 mg Oral BID WC  . furosemide  40 mg Oral Daily  . Influenza vac split quadrivalent PF  0.5 mL Intramuscular Tomorrow-1000  . potassium chloride SA  20 mEq Oral Daily    Assessment/Plan:  64 YO female with 3 days of intermittent difficulty recalling the days and having delayed verbal out put. MRI brain and EEG showed no abnormality. Lab workup unremarkable. Exam non-focal with normal mental status. Unclear etiology. Question TIA in setting of discontinuation of plavix. Could also be related to cessation of tobacco and start of Welbutrin.   -follow up carotid doppler -continue ASA '81mg'$  daily -no further neurological workup indicated at this time. Will follow up as needed.     LOS: 1 day   Jim Like, DO [facility] [phone]  If 7pm- 7am, please page neurology on call as listed in [place]. 11/29/2014  7:47 AM

## 2014-11-30 ENCOUNTER — Ambulatory Visit (HOSPITAL_COMMUNITY): Payer: Medicaid Other

## 2014-11-30 ENCOUNTER — Other Ambulatory Visit: Payer: Self-pay | Admitting: *Deleted

## 2014-11-30 DIAGNOSIS — I38 Endocarditis, valve unspecified: Secondary | ICD-10-CM

## 2014-11-30 DIAGNOSIS — I340 Nonrheumatic mitral (valve) insufficiency: Secondary | ICD-10-CM

## 2014-11-30 DIAGNOSIS — I059 Rheumatic mitral valve disease, unspecified: Secondary | ICD-10-CM

## 2014-11-30 DIAGNOSIS — R410 Disorientation, unspecified: Principal | ICD-10-CM

## 2014-11-30 DIAGNOSIS — I2510 Atherosclerotic heart disease of native coronary artery without angina pectoris: Secondary | ICD-10-CM

## 2014-11-30 DIAGNOSIS — R531 Weakness: Secondary | ICD-10-CM

## 2014-11-30 DIAGNOSIS — R4781 Slurred speech: Secondary | ICD-10-CM

## 2014-11-30 LAB — BASIC METABOLIC PANEL
ANION GAP: 10 (ref 5–15)
BUN: 15 mg/dL (ref 6–20)
CALCIUM: 9.5 mg/dL (ref 8.9–10.3)
CO2: 26 mmol/L (ref 22–32)
Chloride: 101 mmol/L (ref 101–111)
Creatinine, Ser: 1.09 mg/dL — ABNORMAL HIGH (ref 0.44–1.00)
GFR, EST NON AFRICAN AMERICAN: 54 mL/min — AB (ref >60)
Glucose, Bld: 97 mg/dL (ref 65–99)
POTASSIUM: 3.7 mmol/L (ref 3.5–5.1)
SODIUM: 137 mmol/L (ref 135–145)

## 2014-11-30 MED ORDER — FUROSEMIDE 40 MG PO TABS: 20.0000 mg | ORAL_TABLET | Freq: Every day | ORAL | Status: DC

## 2014-11-30 NOTE — Consult Note (Signed)
CARDIOLOGY CONSULT NOTE  Patient ID: Leah [name] MRN: [id number] DOB/AGE: 62-Aug-1952 71 y.o.  Admit date: 11/28/2014 Primary Cardiologist: Dr Martinique Reason for Consultation: Confusion  HPI: 64 yo with history of CAD s/p LAD PCI and rheumatic heart disease with severe MR and moderate AI was admitted with confusion and weakness.  She had been planned for CABG (LIMA-LAD) + bioprosthetic MVR and AVR.  This had been scheduled for today.  However, on Friday, she developed confusion and leg weakness, L>R.  This steadily worsened over the weekend with progressive balance problems and speech slurring. She finally fell, and family brought her to ER on Monday.  CT head and MRI head were unremarkable (no CVA).  EEG normal.  She has been in NSR.  Her symptoms improved in the hospital to the point where today, she says she is completely back to normal.  She was on Wellbutrin for about a month for smoking cessation (has stopped).  There is some thought that this may be causing the symptoms.  She has stopped Wellbutrin.  Surgery has been delayed and cardiology is asked to comment on timing of rescheduling.  She denies dyspnea or chest pain.    Review of systems complete and found to be negative unless listed above in HPI  Past Medical History: 1. HTN 2. COPD 3. PAD 4. Hyperlipidemia 5. GERD 6. Chronic diastolic CHF: TEE (3/29) with EF 55-60%, moderate AI, severe MR.  7. Valvular heart disease: Moderate AI, severe MR. Thought to be rheumatic.  8. CAD: Prior LAD stent.  70% pLAD, 70% mLAD (ISR) on 8/16 cath.    Past Medical History  Diagnosis Date  . Hyperlipidemia   . Hypertension   . GERD (gastroesophageal reflux disease)   . Leg pain   . Headache(784.0)   . Carotid artery occlusion   . COPD (chronic obstructive pulmonary disease)   . CAD (coronary artery disease)   . Peripheral vascular disease   . Tobacco abuse   . Shortness of breath dyspnea   . Renal vascular disease 10/26/2014    bilateral stents placed   . Anemia   . Severe mitral regurgitation      Family History  Problem Relation Age of Onset  . Cancer Mother     BRAIN AND LUNG  . Hypertension Father   . Heart disease Father   . Heart attack Neg Hx   . Stroke Paternal Aunt     great aunt  . Hypertension Mother   . Hypertension Brother     Social History   Social History  . Marital Status: Divorced    Spouse Name: N/A  . Number of Children: 4  . Years of Education: N/A   Occupational History  .  Lab [employer]   Social History Main Topics  . Smoking status: Former Smoker -- 0.50 packs/day for 35 years    Types: Cigarettes    Quit date: 10/31/2014  . Smokeless tobacco: Never Used     Comment: has not started wellbutrin yet  . Alcohol Use: No  . Drug Use: No  . Sexual Activity: No   Other Topics Concern  . Not on file   Social History Narrative     Prescriptions prior to admission  Medication Sig Dispense Refill Last Dose  . acetaminophen (TYLENOL) 325 MG tablet Take 2 tablets (650 mg total) by mouth every 4 (four) hours as needed for fever, headache or mild pain.   unk  . albuterol (PROVENTIL HFA;VENTOLIN  HFA) 108 (90 BASE) MCG/ACT inhaler Inhale 2 puffs into the lungs every 6 (six) hours as needed for wheezing or shortness of breath. 1 Inhaler 0 unk  . amLODipine (NORVASC) 10 MG tablet Take 1 tablet (10 mg total) by mouth daily. 30 tablet 2 11/28/2014 at Unknown time  . aspirin EC 81 MG tablet Take 1 tablet (81 mg total) by mouth daily.   11/28/2014 at Unknown time  . atorvastatin (LIPITOR) 80 MG tablet Take 1 tablet (80 mg total) by mouth daily. 30 tablet 2 11/28/2014 at Unknown time  . buPROPion (WELLBUTRIN XL) 150 MG 24 hr tablet Take 1 tablet (150 mg total) by mouth daily. For 3 days, then increase '300mg'$  (2 tablets) daily (Patient taking differently: Take 150 mg by mouth 2 (two) times daily. ) 60 tablet 0 11/28/2014 at Unknown time  . carvedilol (COREG) 25 MG tablet Take 1 tablet (25 mg total) by  mouth 2 (two) times daily with a meal. ONE TABLET IN AM ONE TABLET IN PM 60 tablet 6 11/28/2014 at 0600  . cyclobenzaprine (FLEXERIL) 10 MG tablet TAKE 1 TABLET BY MOUTH 3 TIMES DAILY AS NEEDED FOR MUSCLE SPASMS 70 tablet 0 unk  . furosemide (LASIX) 40 MG tablet Take 1 tablet (40 mg total) by mouth daily. 30 tablet 6 11/28/2014 at Unknown time  . nitroGLYCERIN (NITROSTAT) 0.4 MG SL tablet Place 1 tablet (0.4 mg total) under the tongue every 5 (five) minutes x 3 doses as needed for chest pain. 25 tablet 12 unk  . potassium chloride SA (K-DUR,KLOR-CON) 20 MEQ tablet Take 1 tablet (20 mEq total) by mouth daily. 30 tablet 6 11/28/2014 at Unknown time  . traMADol (ULTRAM) 50 MG tablet Take 1 tablet (50 mg total) by mouth every 8 (eight) hours as needed. 60 tablet 0 unk  . traZODone (DESYREL) 100 MG tablet Take 1 tablet (100 mg total) by mouth at bedtime as needed for sleep. 30 tablet 2 unk   Current Scheduled Meds: . amLODipine  10 mg Oral Daily  . aspirin EC  81 mg Oral Daily  . atorvastatin  80 mg Oral Daily  . carvedilol  25 mg Oral BID WC  . furosemide  20 mg Oral Daily  . potassium chloride SA  20 mEq Oral Daily   Continuous Infusions:  PRN Meds:.acetaminophen, albuterol, cyclobenzaprine, senna-docusate, traMADol, traZODone  Physical exam Blood pressure 125/63, pulse 54, temperature 97.9 F (36.6 C), temperature source Oral, resp. rate 18, height 6' 1.5" (1.867 m), weight 170 lb 4.8 oz (77.248 kg), SpO2 98 %. General: NAD Neck: No JVD, no thyromegaly or thyroid nodule.  Lungs: Clear to auscultation bilaterally with normal respiratory effort. CV: Nondisplaced PMI.  Heart regular S1/S2, no S3/S4, 2/6 HSM apex (do not hear diastolic murmur today).  No peripheral edema.  No carotid bruit.  Normal pedal pulses.  Abdomen: Soft, nontender, no hepatosplenomegaly, no distention.  Skin: Intact without lesions or rashes.  Neurologic: Alert and oriented x 3.  Psych: Normal affect. Extremities: No  clubbing or cyanosis.  HEENT: Normal.   Labs:   Lab Results  Component Value Date   WBC 11.2* 11/28/2014   HGB 12.8 11/28/2014   HCT 39.1 11/28/2014   MCV 78.7 11/28/2014   PLT 122* 11/28/2014    Recent Labs Lab 11/28/14 1030  11/30/14 0430  NA 136  < > 137  K 3.7  < > 3.7  CL 99*  < > 101  CO2 28  < > 26  BUN  18  < > 15  CREATININE 1.13*  < > 1.09*  CALCIUM 9.7  < > 9.5  PROT 7.2  --   --   BILITOT 0.6  --   --   ALKPHOS 130*  --   --   ALT 12*  --   --   AST 16  --   --   GLUCOSE 98  < > 97  < > = values in this interval not displayed.    Radiology: - MRI head: No acute abnormality - CT head: No acute abnormality  EKG: NSR, LAE  ASSESSMENT AND PLAN: 64 yo with history of CAD s/p LAD PCI and rheumatic heart disease with severe MR and moderate AI was admitted with confusion and weakness.  She had been planned for CABG (LIMA-LAD) + bioprosthetic MVR and AVR.  1. Confusion/weakness: No definite explanation.  CT head/MRI head have not shown CVA and EEG was normal.  Ammonia level normal, other labs unremarkable.  She is completely back to normal.  Possibilities are a reaction to Wellbutrin and discontinuation of nicotine versus TIA now that she is off Plavix.   - I would agree with stopping Wellbutrin.  - Needs carotid dopplers today to rule out significant carotid disease.  I do think TIA is probably less likely than Wellbutrin effect given the length of time symptoms were present (about 4 days) and normal CNS imaging.  2. Rheumatic heart disease: Planned for bioprosthetic AVR/MVR, had been scheduled for today.  I agree with waiting until next week to do the surgery.  No further delay if no further symptoms.  Will need to remain off Plavix.   3. CAD: Stable, no chest pain, plan for LIMA-LAD with valve surgery.   Signed: Loralie Champagne 11/30/2014 1:22 PM

## 2014-11-30 NOTE — Progress Notes (Signed)
SLP Cancellation Note  Patient Details Name: Leah [name] MRN: [id number] DOB: 04/14/1950   Cancelled treatment:       Reason Eval/Treat Not Completed: SLP screened, no needs identified, will sign off   Cynthia Cogle, Katherene Ponto 11/30/2014, 2:54 PM

## 2014-11-30 NOTE — Progress Notes (Signed)
PCP Note   As patient's PCP I stopped by to visit Ms. [place]. This morning she is feeling fair. I spoke with the patient about the plan for today, which they understood from speaking the inpatient team. I will be happy to resume care at the hospital follow up appointment. I appreciate the efforts of the entire nursing and medical team involved in her care.   Archie Patten, MD [facility] Family Medicine Resident  11/30/2014, 10:54 AM

## 2014-11-30 NOTE — Progress Notes (Signed)
Family Medicine Teaching Service Daily Progress Note Intern Pager: [phone]  Patient name: Leah [name] [facility] Medical record number: [id number] Date of birth: 01/28/1951 Age: 64 y.o. Gender: female  Primary Care Provider: Kathrine Cords, MD Consultants: Cardiothoracic Surgery, Neurology Code Status: FULL  Pt Overview and Major Events to Date:  9/5: Admitted with confusion x1 week, bilateral lower extremity weakness/dizziness/slurred speech x5 days. CT, MRI, EEG without abnormality. 9/6: Patient with complete return to baseline.  Assessment and Plan:  Leah [name] is a 64 y.o. female presenting with confusion, slurred speech, and leg weakness. PMH is significant for severe mitral regurgitation, aortic insufficiency, CAD, PVD, HLD, HTN, tobacco abuse (stopped recently), COPD, and diastolic CHF.  Confusion and Weakness: Consider the patient's extensive vascular history and recent discontinuation of Plavix x1 week in preparation for surgery, possibility remains that this clinical picture could represent TIA; however, this is less likely considering the extended timeframe. TSH, Vitamin B-12, RPR, folate, ammonia WNL this admission. More likely, this presentation was secondary to Wellbutrin adverse reaction. Wellbutrin was initially started 3-4 weeks ago for smoking cessation, tapered down to 150 mg on 9/5 with complete resolution of symptoms. Today, the patient continues to be back to baseline and without symptoms for >36 hours. Considering positive orthostatics yesterday and patient's report of a recent fall, presentation could also be partially attributable to hypotension. Cleared by OT/PT yesterday, who have no outpatient recommendations. - Neurology and Cardiothoracic Surgery previously following; have now signed off. Appreciate recommendations. - Per Pharmacy recommendations, no need to taper Wellbutrin following successful smoking cessation. Will discontinue completely today. - Will  adjust BP medications as below. - Discharge on ASA 81 mg daily for stroke prevention. - Atorvastatin 80 mg daily  CAD/Valvular Disease: Patient initially scheduled yesterday (9/6) for AVR, MVR, and GABG with Dr. Servando Snare of Cardiothoracic Surgery. Seen by Dr. Servando Snare of Cardiothoracic yesterday, who has recommended delaying surgery until next week. - Holding Plavix until after surgery next week. As above, will discharge on ASA 81 mg daily. CT Surgery has cleared this plan for antiplatelet therapy.  HTN: Patient normotensive on home regimen of amlodipine 10 mg daily, Coreg 25 mg daily, and Lasix 40 mg PO daily; however, orthostatic hypotension noted yesterday on exam. Decreased home Lasix to 20 mg PO daily with systolics in the 716R overnight. - Would consider d/c amlodipine on an outpatient basis, considering orthostatic hypotension and borderline low BPs. - If BPs elevated again, can give PRN hydralazine (HRs 53-70 overnight, thus will defer increasing BB); history of angioedema, thus no ACE-I or ARB.  Chronic Diastolic Heart Failure: No evidence of fluid overload on exam at admission or this morning. - Continuing home Lasix as above and K-DUR 20 mg daily - Continue to monitor  Smoking Cessation: Patient has successfully stopped smoking, on Wellbutrin 300 mg daily for 3-4 weeks prior to admission. - Wellbutrin 150 mg daily currently. As above, no need to taper Wellbutrin at present. Will discontinue today.  PVD: Stable.  Pulmonary Nodule/COPD: Respiratory status stable overnight. Severe diffusion defect on PFTs. Nodule noted on CXR 10/27/14, thought to be postinfectious. - F/U chest CT 01/2015 - Albuterol PRN  FEN/GI: Heart Healthy Diet, IV Saline Lock PPx: SCDs in place  Disposition: Discharge to home.  Subjective: Patient with continued complete return to baseline. Denies aphasia, word finding difficulties, lower extremity weakness, and changes in her gait. She is anxious for surgical  scheduling.  Objective: Temp:  [97.6 F (36.4 C)-98.2 F (36.8 C)] 97.9 F (36.6  C) (09/07 0520) Pulse Rate:  [53-57] 54 (09/07 0520) Resp:  [16-18] 18 (09/07 0520) BP: (120-128)/(59-64) 125/63 mmHg (09/07 0520) SpO2:  [96 %-100 %] 98 % (09/07 0520) Weight:  [77.248 kg (170 lb 4.8 oz)] 77.248 kg (170 lb 4.8 oz) (09/07 0520)  Physical Exam: General: Resting comfortably while sitting up in bed. Cardiovascular: Regular rate and rhythm. 2/6 holosystolic murmur over the R upper sternal border and L upper sternal border. Radial and DP pulses 2+ bilaterally. Respiratory: Lungs clear to auscultation bilaterally without crackle/wheeze. Normal work of breathing on room air. Abdomen: Soft, non-tender and non-distended without palpable masses or hepatosplenomegaly. Normoactive bowel sounds. Extremities: Warm and well-perfused without edema or erythema. SCDs in place. Neurological: Alert and oriented to person, place, and time. Speech fluent and intact without word finding difficulties or aphasia. Visual fields grossly normal. EOMI. Smile symmetric. Tone grossly normal. Gait intact, ambulating well. Psych: Pleasant and appropriate. Anxious for surgical scheduling.  Laboratory:  Recent Labs Lab 11/25/14 0850 11/28/14 0753  WBC 11.8* 11.2*  HGB 13.3 12.8  HCT 40.6 39.1  PLT 136* 122*     Recent Labs Lab 11/25/14 0850  11/28/14 1030 11/29/14 0509 11/30/14 0430  NA 135  < > 136 138 137  K 4.4  < > 3.7 3.6 3.7  CL 101  < > 99* 101 101  CO2 24  < > '28 28 26  '$ BUN 14  < > '18 12 15  '$ CREATININE 1.19*  < > 1.13* 1.12* 1.09*  CALCIUM 9.6  < > 9.7 9.6 9.5  PROT 7.6  --  7.2  --   --   BILITOT 0.7  --  0.6  --   --   ALKPHOS 133*  --  130*  --   --   ALT 12*  --  12*  --   --   AST 15  --  16  --   --   GLUCOSE 111*  < > 98 103* 97  < > = values in this interval not displayed.  Imaging/Diagnostic Tests: None  Leah [name], Med Student 11/30/2014, 7:26 AM MS4, [place] Intern pager: [phone], text pages welcome  RESIDENT ADDENDUM  I have separately seen and examined the patient. I have discussed the findings and exam with the medical student and agree with the above note, which I have edited appropriately. I helped develop the management plan that is described in the student's note, and I agree with the content.  Additionally I have outlined my exam and assessment/plan below:   PE:  Blood pressure 132/61, pulse 70, temperature 97.6 F (36.4 C), temperature source Oral, resp. rate 16, height 6' 1.5" (1.867 m), weight 170 lb 3.2 oz (77.202 kg), SpO2 100 %. General: Sitting up in bed in NAD. Very interactive. Cardiovascular: RRR. II/VI holosystolic murmur. No pitting edema noted. Respiratory: No increased WOB. CTAB without wheezing, rhonchi, or crackles noted. Neuro: A&O x4. Speech clear with a normal rate. EOMI without nystagmus. Uvula and tongue midline. Facial movements symmetric. Sensation over the face intact/equal. Shoulder shrug intact bilaterally. 5/5 strength in the upper extremities and lower extremities bilaterally. Sensation intact bilaterally in the upper and lower extremities. Psych: Appropriate mood and affect.   A/P:  Leah [name] is a 64 y.o. female presenting with confusion, slurred speech, and leg weakness. PMH is significant for severe mitral regurgitation, aortic insufficiency, CAD, PVD, HLD, HTN, tobacco abuse (stopped recently), COPD, diastolic CHF  Weakness, confusion, and  slurred speech: Patient has a h/o a left carotid endarterectomy in 10/2007. CT and MRI on admission without acute findings. Physical exam at baseline today. Given patient's extensive vascular history, this could be secondary to a TIA however time frame (days) seems less consistent. Has been on Plavix however discontinued this in preparation for aortic, mitral valve replacement and CABG scheduled for 9/6 (has been canceled). Of note, the patient was  started on Wellbutrin approximately 3-4 weeks ago for assistance with tobacco cessation: this could be causing some of her confusion and weakness  - Neurology signed off - TSH, vitamin B12, folate, and ammonia normal. BUN normal, EEG normal - carotid dopplers ordered, still not done but don't think this should delay her discharge - discontinued Wellbutrin  - Patient very orthostatic on exam, decreased Lasix as this could be contributing.   CAD: s/p PCI with stenting of the mid LAD 08/2007. Cardiac cath done 08/24/14 revealed distal stent restenosis of 70% that was not felt to be hemodynamically significant and the plan is for medical treatment. She was seen then seen in follow up 09/07/14 and was doing well. An echo done during then showed an EF of 60-65%, with moderate to severe MR, and mild to moderate AR, and grade 2 diastolic dysfunction.Most recent TEE on 10/24/2014 shoed LVEF to 55-60%, moderate AI, severe MR, moderately calcified aorta, and no evidence of thrombus in LA. TEE is suggestive of rheumatic of valve disease. EKG on admission stable.  - Scheduled to have AVR, MVR, CABG with Dr. Servando Snare on Monday 9/12. - Per his instructions, patient to stay off Plavix, can continue Aspirin '81mg'$  daily . - would prefer her to be off wellbutrin - cardiology to see her today prior to discharge.   HTN: On amlodipine, Coreg, and Lasix at home. H/o angioedema therefore no ACE-inhibitors or ARBs.  - orthostatics as above, will decrease Lasix.  Chronic diastolic heart failure: No evidence of fluid overload on exam. - Continue home KDUR - decrease Lasix to '20mg'$  daily; home care plan if her weight is a few pounds over 170lb (dry weight) - continue to monitor   Smoking cessation: Patient has stopped smoking - Discontinue wellbutrin.   Archie Patten, MD PGY-2,  [facility] 11/30/2014  10:23 AM

## 2014-12-01 ENCOUNTER — Ambulatory Visit: Payer: Self-pay | Admitting: Cardiology

## 2014-12-02 ENCOUNTER — Encounter (HOSPITAL_COMMUNITY)
Admission: RE | Admit: 2014-12-02 | Discharge: 2014-12-02 | Disposition: A | Discharge status: Home / Self Care | Payer: Medicaid Other | Source: Ambulatory Visit | Attending: Cardiothoracic Surgery | Admitting: Cardiothoracic Surgery

## 2014-12-02 ENCOUNTER — Encounter (HOSPITAL_COMMUNITY): Payer: Self-pay

## 2014-12-02 DIAGNOSIS — Z01818 Encounter for other preprocedural examination: Secondary | ICD-10-CM | POA: Insufficient documentation

## 2014-12-02 LAB — BLOOD GAS, ARTERIAL
Acid-base deficit: 0.3 mmol/L (ref 0.0–2.0)
Bicarbonate: 23 mEq/L (ref 20.0–24.0)
Drawn by: 42180
FIO2: 0.21
O2 Saturation: 97.7 %
Patient temperature: 98.6
TCO2: 23.9 mmol/L (ref 0–100)
pCO2 arterial: 32 mmHg — ABNORMAL LOW (ref 35.0–45.0)
pH, Arterial: 7.469 — ABNORMAL HIGH (ref 7.350–7.450)
pO2, Arterial: 95 mmHg (ref 80.0–100.0)

## 2014-12-02 LAB — PROTIME-INR
INR: 1.15 (ref 0.00–1.49)
Prothrombin Time: 14.8 seconds (ref 11.6–15.2)

## 2014-12-02 LAB — APTT: aPTT: 29 seconds (ref 24–37)

## 2014-12-02 NOTE — Progress Notes (Signed)
Patient has been instructed to continue aspirin but remain off Plavix since her admission prior to surgery by Dr. Servando Snare.  This is also seen in Dr. Libby Maw note from 11/30/14.

## 2014-12-02 NOTE — Pre-Procedure Instructions (Signed)
    [facility]  12/02/2014        Your procedure is scheduled on Monday September 12th 2016 at 730am.  Report to [facility] at 530 A.M.  Call this number if you have problems the morning of surgery:  [phone]   Call this number if you have questions in the days leading up to your surgery:  [phone]    Remember:  Do not eat food or drink liquids after midnight Sunday September 11th 2016.  Take these medicines the morning of surgery with A SIP OF WATER amlodipine (norvasc), Carvedilol (coreg).  Remain off plavix and aspirin as directed by Dr. Servando Snare.   Do not wear jewelry, make-up or nail polish.  Do not wear lotions, powders, or perfumes.  You may wear deodorant.  Do not shave 48 hours prior to surgery.  Men may shave face and neck.  Do not bring valuables to the hospital.  [facility] is not responsible for any belongings or valuables.  Contacts, dentures or bridgework may not be worn into surgery.  Leave your suitcase in the car.  After surgery it may be brought to your room.  For patients admitted to the hospital, discharge time will be determined by your treatment team.  Patients discharged the day of surgery will not be allowed to drive home.   Special instructions: Please follow these instructions carefully:  1. Shower with CHG Soap the night before surgery and the morning of Surgery. 2. If you choose to wash your hair, wash your hair first as usual with your normal shampoo. 3. After you shampoo, rinse your hair and body thoroughly to remove the Shampoo. 4. Use CHG as you would any other liquid soap. You can apply chg directly to the skin and wash gently with scrungie or a clean washcloth. 5. Apply the CHG Soap to your body ONLY FROM THE NECK DOWN. Do not use on open wounds or open sores. Avoid contact with your eyes, ears, mouth and genitals (private parts). Wash  genitals (private parts) with your normal soap. 6. Wash thoroughly, paying special attention to the area where your surgery will be performed. 7. Thoroughly rinse your body with warm water from the neck down. 8. DO NOT shower/wash with your normal soap after using and rinsing off the CHG Soap. 9. Pat yourself dry with a clean towel.  10. Wear clean pajamas.  11. Place clean sheets on your bed the night of your first shower and do not sleep with pets.  Day of Surgery  Do not apply any lotions/deodorants the morning of surgery. Please wear clean clothes to the hospital/surgery center.    Please read over the following fact sheets that you were given. Pain Booklet, Coughing and Deep Breathing, Blood Transfusion Information, Open Heart Packet and Surgical Site Infection Prevention

## 2014-12-03 LAB — HEMOGLOBIN A1C
Hgb A1c MFr Bld: 6.3 % — ABNORMAL HIGH (ref 4.8–5.6)
Mean Plasma Glucose: 134 mg/dL

## 2014-12-04 MED ORDER — DOPAMINE-DEXTROSE 3.2-5 MG/ML-% IV SOLN
0.0000 ug/kg/min | INTRAVENOUS | Status: DC
Start: 2014-12-05 — End: 2014-12-05
  Filled 2014-12-04: qty 250

## 2014-12-04 MED ORDER — DEXMEDETOMIDINE HCL IN NACL 400 MCG/100ML IV SOLN
0.1000 ug/kg/h | INTRAVENOUS | Status: AC
  Administered 2014-12-05: .3 ug/kg/h via INTRAVENOUS
  Filled 2014-12-04: qty 100

## 2014-12-04 MED ORDER — NITROGLYCERIN IN D5W 200-5 MCG/ML-% IV SOLN
2.0000 ug/min | INTRAVENOUS | Status: AC
  Administered 2014-12-05: 5 ug/min via INTRAVENOUS
  Filled 2014-12-04: qty 250

## 2014-12-04 MED ORDER — PHENYLEPHRINE HCL 10 MG/ML IJ SOLN
30.0000 ug/min | INTRAVENOUS | Status: AC
  Administered 2014-12-05: 10 ug/min via INTRAVENOUS
  Filled 2014-12-04: qty 2

## 2014-12-04 MED ORDER — SODIUM CHLORIDE 0.9 % IV SOLN
INTRAVENOUS | Status: AC
  Administered 2014-12-05: 69 mL/h via INTRAVENOUS
  Filled 2014-12-04: qty 40

## 2014-12-04 MED ORDER — SODIUM CHLORIDE 0.9 % IV SOLN
INTRAVENOUS | Status: AC
  Administered 2014-12-05: 1 [IU]/h via INTRAVENOUS
  Filled 2014-12-04: qty 2.5

## 2014-12-04 MED ORDER — SODIUM CHLORIDE 0.9 % IV SOLN
1250.0000 mg | INTRAVENOUS | Status: AC
  Administered 2014-12-05: 1250 mg via INTRAVENOUS
  Filled 2014-12-04: qty 1250

## 2014-12-04 MED ORDER — EPINEPHRINE HCL 1 MG/ML IJ SOLN
0.0000 ug/min | INTRAVENOUS | Status: DC
  Filled 2014-12-04: qty 4

## 2014-12-04 MED ORDER — MAGNESIUM SULFATE 50 % IJ SOLN
40.0000 meq | INTRAMUSCULAR | Status: DC
  Filled 2014-12-04: qty 10

## 2014-12-04 MED ORDER — PLASMA-LYTE 148 IV SOLN
INTRAVENOUS | Status: AC
  Administered 2014-12-05: 500 mL
  Filled 2014-12-04: qty 2.5

## 2014-12-04 MED ORDER — LEVOFLOXACIN IN D5W 500 MG/100ML IV SOLN
500.0000 mg | INTRAVENOUS | Status: AC
  Administered 2014-12-05: 500 mg via INTRAVENOUS
  Filled 2014-12-04: qty 100

## 2014-12-04 MED ORDER — SODIUM CHLORIDE 0.9 % IV SOLN
INTRAVENOUS | Status: DC
  Filled 2014-12-04: qty 30

## 2014-12-04 MED ORDER — POTASSIUM CHLORIDE 2 MEQ/ML IV SOLN
80.0000 meq | INTRAVENOUS | Status: DC
  Filled 2014-12-04: qty 40

## 2014-12-05 ENCOUNTER — Inpatient Hospital Stay (HOSPITAL_COMMUNITY): Payer: Medicaid Other

## 2014-12-05 ENCOUNTER — Encounter (HOSPITAL_COMMUNITY)
Admission: RE | Disposition: A | Discharge status: Home / Self Care | Payer: No Typology Code available for payment source | Source: Ambulatory Visit | Attending: Cardiothoracic Surgery

## 2014-12-05 ENCOUNTER — Inpatient Hospital Stay (HOSPITAL_COMMUNITY): Payer: Medicaid Other | Admitting: Anesthesiology

## 2014-12-05 ENCOUNTER — Inpatient Hospital Stay (HOSPITAL_COMMUNITY)
Admission: RE | Admit: 2014-12-05 | Discharge: 2014-12-10 | DRG: 220 | Disposition: A | Discharge status: Home / Self Care | Payer: Medicaid Other | Source: Ambulatory Visit | Attending: Cardiothoracic Surgery | Admitting: Cardiothoracic Surgery

## 2014-12-05 ENCOUNTER — Other Ambulatory Visit: Payer: Self-pay

## 2014-12-05 ENCOUNTER — Encounter (HOSPITAL_COMMUNITY): Payer: Self-pay | Admitting: *Deleted

## 2014-12-05 DIAGNOSIS — I493 Ventricular premature depolarization: Secondary | ICD-10-CM | POA: Diagnosis present

## 2014-12-05 DIAGNOSIS — I340 Nonrheumatic mitral (valve) insufficiency: Secondary | ICD-10-CM

## 2014-12-05 DIAGNOSIS — I351 Nonrheumatic aortic (valve) insufficiency: Secondary | ICD-10-CM | POA: Diagnosis present

## 2014-12-05 DIAGNOSIS — Z952 Presence of prosthetic heart valve: Secondary | ICD-10-CM

## 2014-12-05 DIAGNOSIS — R0602 Shortness of breath: Secondary | ICD-10-CM | POA: Diagnosis present

## 2014-12-05 DIAGNOSIS — I38 Endocarditis, valve unspecified: Secondary | ICD-10-CM

## 2014-12-05 DIAGNOSIS — Z9889 Other specified postprocedural states: Secondary | ICD-10-CM

## 2014-12-05 DIAGNOSIS — I2510 Atherosclerotic heart disease of native coronary artery without angina pectoris: Secondary | ICD-10-CM | POA: Diagnosis present

## 2014-12-05 DIAGNOSIS — E785 Hyperlipidemia, unspecified: Secondary | ICD-10-CM | POA: Diagnosis present

## 2014-12-05 DIAGNOSIS — M545 Low back pain, unspecified: Secondary | ICD-10-CM

## 2014-12-05 DIAGNOSIS — E119 Type 2 diabetes mellitus without complications: Secondary | ICD-10-CM | POA: Diagnosis present

## 2014-12-05 DIAGNOSIS — I700 Atherosclerosis of aorta: Secondary | ICD-10-CM | POA: Diagnosis present

## 2014-12-05 DIAGNOSIS — I739 Peripheral vascular disease, unspecified: Secondary | ICD-10-CM | POA: Diagnosis present

## 2014-12-05 DIAGNOSIS — J449 Chronic obstructive pulmonary disease, unspecified: Secondary | ICD-10-CM | POA: Diagnosis present

## 2014-12-05 DIAGNOSIS — D62 Acute posthemorrhagic anemia: Secondary | ICD-10-CM | POA: Diagnosis not present

## 2014-12-05 DIAGNOSIS — F17210 Nicotine dependence, cigarettes, uncomplicated: Secondary | ICD-10-CM | POA: Diagnosis present

## 2014-12-05 DIAGNOSIS — K219 Gastro-esophageal reflux disease without esophagitis: Secondary | ICD-10-CM | POA: Diagnosis present

## 2014-12-05 DIAGNOSIS — D696 Thrombocytopenia, unspecified: Secondary | ICD-10-CM | POA: Diagnosis present

## 2014-12-05 DIAGNOSIS — I10 Essential (primary) hypertension: Secondary | ICD-10-CM | POA: Diagnosis present

## 2014-12-05 DIAGNOSIS — Z951 Presence of aortocoronary bypass graft: Secondary | ICD-10-CM

## 2014-12-05 HISTORY — PX: MITRAL VALVE REPLACEMENT: SHX147

## 2014-12-05 HISTORY — PX: TEE WITHOUT CARDIOVERSION: SHX5443

## 2014-12-05 HISTORY — PX: CORONARY ARTERY BYPASS GRAFT: SHX141

## 2014-12-05 LAB — POCT I-STAT 3, ART BLOOD GAS (G3+)
Acid-Base Excess: 6 mmol/L — ABNORMAL HIGH (ref 0.0–2.0)
Acid-Base Excess: 5 mmol/L — ABNORMAL HIGH (ref 0.0–2.0)
Acid-base deficit: 1 mmol/L (ref 0.0–2.0)
Bicarbonate: 29.3 mEq/L — ABNORMAL HIGH (ref 20.0–24.0)
Bicarbonate: 26.9 mEq/L — ABNORMAL HIGH (ref 20.0–24.0)
Bicarbonate: 24.9 mEq/L — ABNORMAL HIGH (ref 20.0–24.0)
Bicarbonate: 26.1 mEq/L — ABNORMAL HIGH (ref 20.0–24.0)
O2 Saturation: 100 %
O2 Saturation: 100 %
O2 Saturation: 99 %
O2 Saturation: 98 %
Patient temperature: 36.1
Patient temperature: 36.2
TCO2: 30 mmol/L (ref 0–100)
TCO2: 28 mmol/L (ref 0–100)
TCO2: 26 mmol/L (ref 0–100)
TCO2: 28 mmol/L (ref 0–100)
pCO2 arterial: 36.1 mmHg (ref 35.0–45.0)
pCO2 arterial: 26.9 mmHg — ABNORMAL LOW (ref 35.0–45.0)
pCO2 arterial: 36.6 mmHg (ref 35.0–45.0)
pCO2 arterial: 50.6 mmHg — ABNORMAL HIGH (ref 35.0–45.0)
pH, Arterial: 7.517 — ABNORMAL HIGH (ref 7.350–7.450)
pH, Arterial: 7.608 (ref 7.350–7.450)
pH, Arterial: 7.437 (ref 7.350–7.450)
pH, Arterial: 7.316 — ABNORMAL LOW (ref 7.350–7.450)
pO2, Arterial: 358 mmHg — ABNORMAL HIGH (ref 80.0–100.0)
pO2, Arterial: 352 mmHg — ABNORMAL HIGH (ref 80.0–100.0)
pO2, Arterial: 157 mmHg — ABNORMAL HIGH (ref 80.0–100.0)
pO2, Arterial: 121 mmHg — ABNORMAL HIGH (ref 80.0–100.0)

## 2014-12-05 LAB — CBC
HCT: 29.7 % — ABNORMAL LOW (ref 36.0–46.0)
HCT: 27.7 % — ABNORMAL LOW (ref 36.0–46.0)
HEMATOCRIT: 29.6 % — AB (ref 36.0–46.0)
HEMOGLOBIN: 9.9 g/dL — AB (ref 12.0–15.0)
Hemoglobin: 9.7 g/dL — ABNORMAL LOW (ref 12.0–15.0)
Hemoglobin: 9 g/dL — ABNORMAL LOW (ref 12.0–15.0)
MCH: 25.3 pg — ABNORMAL LOW (ref 26.0–34.0)
MCH: 25.9 pg — AB (ref 26.0–34.0)
MCH: 25.1 pg — ABNORMAL LOW (ref 26.0–34.0)
MCHC: 32.7 g/dL (ref 30.0–36.0)
MCHC: 33.4 g/dL (ref 30.0–36.0)
MCHC: 32.5 g/dL (ref 30.0–36.0)
MCV: 77.3 fL — ABNORMAL LOW (ref 78.0–100.0)
MCV: 77.5 fL — ABNORMAL LOW (ref 78.0–100.0)
MCV: 77.4 fL — ABNORMAL LOW (ref 78.0–100.0)
PLATELETS: 95 10*3/uL — AB (ref 150–400)
Platelets: 109 10*3/uL — ABNORMAL LOW (ref 150–400)
Platelets: 102 10*3/uL — ABNORMAL LOW (ref 150–400)
RBC: 3.84 MIL/uL — ABNORMAL LOW (ref 3.87–5.11)
RBC: 3.82 MIL/uL — AB (ref 3.87–5.11)
RBC: 3.58 MIL/uL — ABNORMAL LOW (ref 3.87–5.11)
RDW: 14.7 % (ref 11.5–15.5)
RDW: 15 % (ref 11.5–15.5)
RDW: 14.8 % (ref 11.5–15.5)
WBC: 14.4 10*3/uL — AB (ref 4.0–10.5)
WBC: 16.3 10*3/uL — ABNORMAL HIGH (ref 4.0–10.5)
WBC: 15.5 10*3/uL — ABNORMAL HIGH (ref 4.0–10.5)

## 2014-12-05 LAB — POCT I-STAT, CHEM 8
BUN: 13 mg/dL (ref 6–20)
BUN: 10 mg/dL (ref 6–20)
BUN: 11 mg/dL (ref 6–20)
BUN: 11 mg/dL (ref 6–20)
BUN: 10 mg/dL (ref 6–20)
BUN: 8 mg/dL (ref 6–20)
Calcium, Ion: 1.22 mmol/L (ref 1.13–1.30)
Calcium, Ion: 0.97 mmol/L — ABNORMAL LOW (ref 1.13–1.30)
Calcium, Ion: 1.22 mmol/L (ref 1.13–1.30)
Calcium, Ion: 1.11 mmol/L — ABNORMAL LOW (ref 1.13–1.30)
Calcium, Ion: 1.22 mmol/L (ref 1.13–1.30)
Calcium, Ion: 1.27 mmol/L (ref 1.13–1.30)
Chloride: 102 mmol/L (ref 101–111)
Chloride: 100 mmol/L — ABNORMAL LOW (ref 101–111)
Chloride: 96 mmol/L — ABNORMAL LOW (ref 101–111)
Chloride: 97 mmol/L — ABNORMAL LOW (ref 101–111)
Chloride: 100 mmol/L — ABNORMAL LOW (ref 101–111)
Chloride: 104 mmol/L (ref 101–111)
Creatinine, Ser: 0.7 mg/dL (ref 0.44–1.00)
Creatinine, Ser: 0.6 mg/dL (ref 0.44–1.00)
Creatinine, Ser: 0.6 mg/dL (ref 0.44–1.00)
Creatinine, Ser: 0.7 mg/dL (ref 0.44–1.00)
Creatinine, Ser: 0.6 mg/dL (ref 0.44–1.00)
Creatinine, Ser: 0.8 mg/dL (ref 0.44–1.00)
Glucose, Bld: 153 mg/dL — ABNORMAL HIGH (ref 65–99)
Glucose, Bld: 102 mg/dL — ABNORMAL HIGH (ref 65–99)
Glucose, Bld: 104 mg/dL — ABNORMAL HIGH (ref 65–99)
Glucose, Bld: 159 mg/dL — ABNORMAL HIGH (ref 65–99)
Glucose, Bld: 147 mg/dL — ABNORMAL HIGH (ref 65–99)
Glucose, Bld: 136 mg/dL — ABNORMAL HIGH (ref 65–99)
HCT: 36 % (ref 36.0–46.0)
HCT: 26 % — ABNORMAL LOW (ref 36.0–46.0)
HCT: 33 % — ABNORMAL LOW (ref 36.0–46.0)
HCT: 24 % — ABNORMAL LOW (ref 36.0–46.0)
HCT: 25 % — ABNORMAL LOW (ref 36.0–46.0)
HCT: 28 % — ABNORMAL LOW (ref 36.0–46.0)
Hemoglobin: 12.2 g/dL (ref 12.0–15.0)
Hemoglobin: 11.2 g/dL — ABNORMAL LOW (ref 12.0–15.0)
Hemoglobin: 8.8 g/dL — ABNORMAL LOW (ref 12.0–15.0)
Hemoglobin: 8.2 g/dL — ABNORMAL LOW (ref 12.0–15.0)
Hemoglobin: 8.5 g/dL — ABNORMAL LOW (ref 12.0–15.0)
Hemoglobin: 9.5 g/dL — ABNORMAL LOW (ref 12.0–15.0)
Potassium: 3.4 mmol/L — ABNORMAL LOW (ref 3.5–5.1)
Potassium: 3.1 mmol/L — ABNORMAL LOW (ref 3.5–5.1)
Potassium: 3.1 mmol/L — ABNORMAL LOW (ref 3.5–5.1)
Potassium: 3.3 mmol/L — ABNORMAL LOW (ref 3.5–5.1)
Potassium: 2.9 mmol/L — ABNORMAL LOW (ref 3.5–5.1)
Potassium: 4 mmol/L (ref 3.5–5.1)
Sodium: 136 mmol/L (ref 135–145)
Sodium: 137 mmol/L (ref 135–145)
Sodium: 139 mmol/L (ref 135–145)
Sodium: 135 mmol/L (ref 135–145)
Sodium: 137 mmol/L (ref 135–145)
Sodium: 137 mmol/L (ref 135–145)
TCO2: 27 mmol/L (ref 0–100)
TCO2: 26 mmol/L (ref 0–100)
TCO2: 28 mmol/L (ref 0–100)
TCO2: 27 mmol/L (ref 0–100)
TCO2: 25 mmol/L (ref 0–100)
TCO2: 24 mmol/L (ref 0–100)

## 2014-12-05 LAB — PROTIME-INR
INR: 2.11 — AB (ref 0.00–1.49)
INR: 1.91 — AB (ref 0.00–1.49)
PROTHROMBIN TIME: 23.5 s — AB (ref 11.6–15.2)
Prothrombin Time: 21.8 seconds — ABNORMAL HIGH (ref 11.6–15.2)

## 2014-12-05 LAB — APTT
APTT: 57 s — AB (ref 24–37)
aPTT: 50 seconds — ABNORMAL HIGH (ref 24–37)

## 2014-12-05 LAB — TYPE AND SCREEN
ABO/RH(D): O POS
Antibody Screen: NEGATIVE

## 2014-12-05 LAB — CREATININE, SERUM
Creatinine, Ser: 0.79 mg/dL (ref 0.44–1.00)
GFR calc Af Amer: >60 mL/min (ref >60)
GFR calc non Af Amer: >60 mL/min (ref >60)

## 2014-12-05 LAB — HEMOGLOBIN AND HEMATOCRIT, BLOOD
HCT: 24.3 % — ABNORMAL LOW (ref 36.0–46.0)
Hemoglobin: 8 g/dL — ABNORMAL LOW (ref 12.0–15.0)

## 2014-12-05 LAB — POCT I-STAT 4, (NA,K, GLUC, HGB,HCT)
Glucose, Bld: 111 mg/dL — ABNORMAL HIGH (ref 65–99)
HCT: 31 % — ABNORMAL LOW (ref 36.0–46.0)
Hemoglobin: 10.5 g/dL — ABNORMAL LOW (ref 12.0–15.0)
Potassium: 3.1 mmol/L — ABNORMAL LOW (ref 3.5–5.1)
Sodium: 140 mmol/L (ref 135–145)

## 2014-12-05 LAB — FIBRINOGEN: Fibrinogen: 228 mg/dL (ref 204–475)

## 2014-12-05 LAB — PLATELET COUNT: Platelets: 65 10*3/uL — ABNORMAL LOW (ref 150–400)

## 2014-12-05 LAB — MAGNESIUM: Magnesium: 2.5 mg/dL — ABNORMAL HIGH (ref 1.7–2.4)

## 2014-12-05 SURGERY — REPLACEMENT, MITRAL VALVE
Anesthesia: General | Site: Chest

## 2014-12-05 MED ORDER — ONDANSETRON HCL 4 MG/2ML IJ SOLN
INTRAMUSCULAR | Status: AC
  Filled 2014-12-05: qty 2

## 2014-12-05 MED ORDER — MIDAZOLAM HCL 5 MG/5ML IJ SOLN
INTRAMUSCULAR | Status: DC | PRN
  Administered 2014-12-05 (×3): 2 mg via INTRAVENOUS
  Administered 2014-12-05 (×2): 3 mg via INTRAVENOUS

## 2014-12-05 MED ORDER — 0.9 % SODIUM CHLORIDE (POUR BTL) OPTIME
TOPICAL | Status: DC | PRN
  Administered 2014-12-05: 1000 mL

## 2014-12-05 MED ORDER — MIDAZOLAM HCL 10 MG/2ML IJ SOLN
INTRAMUSCULAR | Status: AC
  Filled 2014-12-05: qty 4

## 2014-12-05 MED ORDER — NITROGLYCERIN IN D5W 200-5 MCG/ML-% IV SOLN: 0.0000 ug/min | INTRAVENOUS | Status: DC

## 2014-12-05 MED ORDER — MAGNESIUM SULFATE 4 GM/100ML IV SOLN
4.0000 g | Freq: Once | INTRAVENOUS | Status: AC
  Administered 2014-12-05: 4 g via INTRAVENOUS
  Filled 2014-12-05: qty 100

## 2014-12-05 MED ORDER — MORPHINE SULFATE (PF) 2 MG/ML IV SOLN: 1.0000 mg | INTRAVENOUS | Status: AC | PRN

## 2014-12-05 MED ORDER — PROPOFOL 10 MG/ML IV BOLUS
INTRAVENOUS | Status: DC | PRN
  Administered 2014-12-05: 50 mg via INTRAVENOUS

## 2014-12-05 MED ORDER — FENTANYL CITRATE (PF) 250 MCG/5ML IJ SOLN
INTRAMUSCULAR | Status: AC
  Filled 2014-12-05: qty 5

## 2014-12-05 MED ORDER — FAMOTIDINE IN NACL 20-0.9 MG/50ML-% IV SOLN
20.0000 mg | Freq: Two times a day (BID) | INTRAVENOUS | Status: AC
  Administered 2014-12-05 (×2): 20 mg via INTRAVENOUS
  Filled 2014-12-05: qty 50

## 2014-12-05 MED ORDER — PHENYLEPHRINE 40 MCG/ML (10ML) SYRINGE FOR IV PUSH (FOR BLOOD PRESSURE SUPPORT)
PREFILLED_SYRINGE | INTRAVENOUS | Status: AC
  Filled 2014-12-05: qty 10

## 2014-12-05 MED ORDER — BISACODYL 5 MG PO TBEC
10.0000 mg | DELAYED_RELEASE_TABLET | Freq: Every day | ORAL | Status: DC
  Administered 2014-12-06 – 2014-12-08 (×3): 10 mg via ORAL
  Filled 2014-12-05 (×3): qty 2

## 2014-12-05 MED ORDER — DOPAMINE-DEXTROSE 3.2-5 MG/ML-% IV SOLN: 3.0000 ug/kg/min | INTRAVENOUS | Status: DC

## 2014-12-05 MED ORDER — PROTAMINE SULFATE 10 MG/ML IV SOLN
INTRAVENOUS | Status: DC | PRN
  Administered 2014-12-05: 20 mg via INTRAVENOUS
  Administered 2014-12-05: 50 mg via INTRAVENOUS
  Administered 2014-12-05 (×2): 20 mg via INTRAVENOUS
  Administered 2014-12-05: 30 mg via INTRAVENOUS
  Administered 2014-12-05 (×4): 20 mg via INTRAVENOUS
  Administered 2014-12-05: 50 mg via INTRAVENOUS

## 2014-12-05 MED ORDER — PROPOFOL 10 MG/ML IV BOLUS
INTRAVENOUS | Status: AC
  Filled 2014-12-05: qty 20

## 2014-12-05 MED ORDER — ALBUMIN HUMAN 5 % IV SOLN
INTRAVENOUS | Status: DC | PRN
  Administered 2014-12-05 (×2): via INTRAVENOUS

## 2014-12-05 MED ORDER — BISACODYL 10 MG RE SUPP: 10.0000 mg | Freq: Every day | RECTAL | Status: DC

## 2014-12-05 MED ORDER — ROCURONIUM BROMIDE 100 MG/10ML IV SOLN
INTRAVENOUS | Status: DC | PRN
  Administered 2014-12-05: 50 mg via INTRAVENOUS
  Administered 2014-12-05: 30 mg via INTRAVENOUS
  Administered 2014-12-05: 50 mg via INTRAVENOUS
  Administered 2014-12-05: 20 mg via INTRAVENOUS
  Administered 2014-12-05: 50 mg via INTRAVENOUS

## 2014-12-05 MED ORDER — MIDAZOLAM HCL 2 MG/2ML IJ SOLN
INTRAMUSCULAR | Status: AC
  Filled 2014-12-05: qty 4

## 2014-12-05 MED ORDER — LIDOCAINE HCL (CARDIAC) 20 MG/ML IV SOLN
INTRAVENOUS | Status: DC | PRN
  Administered 2014-12-05: 80 mg via INTRAVENOUS

## 2014-12-05 MED ORDER — IPRATROPIUM-ALBUTEROL 20-100 MCG/ACT IN AERS
INHALATION_SPRAY | RESPIRATORY_TRACT | Status: DC | PRN
  Administered 2014-12-05: 2 via RESPIRATORY_TRACT

## 2014-12-05 MED ORDER — ONDANSETRON HCL 4 MG/2ML IJ SOLN
4.0000 mg | Freq: Four times a day (QID) | INTRAMUSCULAR | Status: DC | PRN
  Administered 2014-12-06: 4 mg via INTRAVENOUS
  Filled 2014-12-05: qty 2

## 2014-12-05 MED ORDER — ACETAMINOPHEN 160 MG/5ML PO SOLN: 1000.0000 mg | Freq: Four times a day (QID) | ORAL | Status: DC

## 2014-12-05 MED ORDER — HEPARIN SODIUM (PORCINE) 1000 UNIT/ML IJ SOLN
INTRAMUSCULAR | Status: DC | PRN
  Administered 2014-12-05: 27000 [IU] via INTRAVENOUS

## 2014-12-05 MED ORDER — SODIUM CHLORIDE 0.45 % IV SOLN
INTRAVENOUS | Status: DC | PRN
  Administered 2014-12-05: 20 mL via INTRAVENOUS

## 2014-12-05 MED ORDER — ASPIRIN 81 MG PO CHEW: 324.0000 mg | CHEWABLE_TABLET | Freq: Every day | ORAL | Status: DC

## 2014-12-05 MED ORDER — SODIUM CHLORIDE 0.9 % IV SOLN
INTRAVENOUS | Status: DC
  Administered 2014-12-05: 20 mL via INTRAVENOUS

## 2014-12-05 MED ORDER — TRAMADOL HCL 50 MG PO TABS: 50.0000 mg | ORAL_TABLET | ORAL | Status: DC | PRN

## 2014-12-05 MED ORDER — LACTATED RINGERS IV SOLN
INTRAVENOUS | Status: DC | PRN
  Administered 2014-12-05: 06:00:00 via INTRAVENOUS

## 2014-12-05 MED ORDER — METOPROLOL TARTRATE 1 MG/ML IV SOLN: 2.5000 mg | INTRAVENOUS | Status: DC | PRN

## 2014-12-05 MED ORDER — SODIUM CHLORIDE 0.9 % IJ SOLN: 3.0000 mL | INTRAMUSCULAR | Status: DC | PRN

## 2014-12-05 MED ORDER — HEMOSTATIC AGENTS (NO CHARGE) OPTIME
TOPICAL | Status: DC | PRN
  Administered 2014-12-05 (×2): 1 via TOPICAL

## 2014-12-05 MED ORDER — MORPHINE SULFATE (PF) 2 MG/ML IV SOLN
2.0000 mg | INTRAVENOUS | Status: DC | PRN
  Administered 2014-12-06 – 2014-12-08 (×5): 2 mg via INTRAVENOUS
  Filled 2014-12-05 (×5): qty 1

## 2014-12-05 MED ORDER — METOPROLOL TARTRATE 12.5 MG HALF TABLET: 12.5000 mg | ORAL_TABLET | Freq: Once | ORAL | Status: DC

## 2014-12-05 MED ORDER — FENTANYL CITRATE (PF) 100 MCG/2ML IJ SOLN
INTRAMUSCULAR | Status: DC | PRN
  Administered 2014-12-05: 250 ug via INTRAVENOUS
  Administered 2014-12-05: 150 ug via INTRAVENOUS
  Administered 2014-12-05: 100 ug via INTRAVENOUS
  Administered 2014-12-05 (×2): 150 ug via INTRAVENOUS
  Administered 2014-12-05: 100 ug via INTRAVENOUS
  Administered 2014-12-05: 250 ug via INTRAVENOUS
  Administered 2014-12-05: 100 ug via INTRAVENOUS

## 2014-12-05 MED ORDER — ALBUMIN HUMAN 5 % IV SOLN
250.0000 mL | INTRAVENOUS | Status: AC | PRN
  Administered 2014-12-05 (×4): 250 mL via INTRAVENOUS
  Filled 2014-12-05 (×2): qty 250

## 2014-12-05 MED ORDER — CHLORHEXIDINE GLUCONATE 0.12% ORAL RINSE (MEDLINE KIT)
15.0000 mL | Freq: Two times a day (BID) | OROMUCOSAL | Status: DC
  Administered 2014-12-05: 15 mL via OROMUCOSAL

## 2014-12-05 MED ORDER — INSULIN REGULAR BOLUS VIA INFUSION
0.0000 [IU] | Freq: Three times a day (TID) | INTRAVENOUS | Status: DC
  Filled 2014-12-05: qty 10

## 2014-12-05 MED ORDER — VANCOMYCIN HCL IN DEXTROSE 1-5 GM/200ML-% IV SOLN
1000.0000 mg | Freq: Once | INTRAVENOUS | Status: AC
  Administered 2014-12-05: 1000 mg via INTRAVENOUS
  Filled 2014-12-05: qty 200

## 2014-12-05 MED ORDER — DOCUSATE SODIUM 100 MG PO CAPS
200.0000 mg | ORAL_CAPSULE | Freq: Every day | ORAL | Status: DC
  Administered 2014-12-06 – 2014-12-08 (×3): 200 mg via ORAL
  Filled 2014-12-05 (×3): qty 2

## 2014-12-05 MED ORDER — ACETAMINOPHEN 650 MG RE SUPP
650.0000 mg | Freq: Once | RECTAL | Status: AC
  Administered 2014-12-05: 650 mg via RECTAL

## 2014-12-05 MED ORDER — DEXMEDETOMIDINE HCL IN NACL 200 MCG/50ML IV SOLN: 0.0000 ug/kg/h | INTRAVENOUS | Status: DC

## 2014-12-05 MED ORDER — LACTATED RINGERS IV SOLN: 500.0000 mL | Freq: Once | INTRAVENOUS | Status: DC | PRN

## 2014-12-05 MED ORDER — HEMOSTATIC AGENTS (NO CHARGE) OPTIME
TOPICAL | Status: DC | PRN
  Administered 2014-12-05: 1 via TOPICAL

## 2014-12-05 MED ORDER — LACTATED RINGERS IV SOLN
INTRAVENOUS | Status: DC
  Administered 2014-12-07: 20 mL/h via INTRAVENOUS

## 2014-12-05 MED ORDER — DOPAMINE-DEXTROSE 3.2-5 MG/ML-% IV SOLN
INTRAVENOUS | Status: DC | PRN
  Administered 2014-12-05: 3 ug/kg/min via INTRAVENOUS

## 2014-12-05 MED ORDER — ASPIRIN EC 325 MG PO TBEC
325.0000 mg | DELAYED_RELEASE_TABLET | Freq: Every day | ORAL | Status: DC
  Administered 2014-12-06: 325 mg via ORAL
  Filled 2014-12-05 (×2): qty 1

## 2014-12-05 MED ORDER — ATORVASTATIN CALCIUM 80 MG PO TABS
80.0000 mg | ORAL_TABLET | Freq: Every day | ORAL | Status: DC
Start: 2014-12-05 — End: 2014-12-10
  Administered 2014-12-06 – 2014-12-10 (×5): 80 mg via ORAL
  Filled 2014-12-05: qty 1
  Filled 2014-12-05: qty 2
  Filled 2014-12-05 (×4): qty 1

## 2014-12-05 MED ORDER — SODIUM CHLORIDE 0.9 % IV SOLN
INTRAVENOUS | Status: DC
  Administered 2014-12-05: 1 [IU]/h via INTRAVENOUS
  Filled 2014-12-05 (×2): qty 2.5

## 2014-12-05 MED ORDER — POTASSIUM CHLORIDE 10 MEQ/50ML IV SOLN
10.0000 meq | INTRAVENOUS | Status: AC
  Administered 2014-12-05 (×3): 10 meq via INTRAVENOUS

## 2014-12-05 MED ORDER — CHLORHEXIDINE GLUCONATE 4 % EX LIQD: 30.0000 mL | CUTANEOUS | Status: DC

## 2014-12-05 MED ORDER — METOPROLOL TARTRATE 12.5 MG HALF TABLET
12.5000 mg | ORAL_TABLET | Freq: Two times a day (BID) | ORAL | Status: DC
  Administered 2014-12-06 – 2014-12-08 (×4): 12.5 mg via ORAL
  Filled 2014-12-05 (×7): qty 1

## 2014-12-05 MED ORDER — ACETAMINOPHEN 500 MG PO TABS
1000.0000 mg | ORAL_TABLET | Freq: Four times a day (QID) | ORAL | Status: DC
  Administered 2014-12-06 – 2014-12-08 (×8): 1000 mg via ORAL
  Filled 2014-12-05 (×14): qty 2

## 2014-12-05 MED ORDER — ROCURONIUM BROMIDE 50 MG/5ML IV SOLN
INTRAVENOUS | Status: AC
  Filled 2014-12-05: qty 1

## 2014-12-05 MED ORDER — MIDAZOLAM HCL 2 MG/2ML IJ SOLN: 2.0000 mg | INTRAMUSCULAR | Status: DC | PRN

## 2014-12-05 MED ORDER — SODIUM CHLORIDE 0.9 % IV SOLN: 250.0000 mL | INTRAVENOUS | Status: DC

## 2014-12-05 MED ORDER — SODIUM CHLORIDE 0.9 % IJ SOLN
INTRAMUSCULAR | Status: AC
  Filled 2014-12-05: qty 10

## 2014-12-05 MED ORDER — INSULIN ASPART 100 UNIT/ML ~~LOC~~ SOLN
0.0000 [IU] | SUBCUTANEOUS | Status: DC
  Administered 2014-12-05 – 2014-12-06 (×3): 2 [IU] via SUBCUTANEOUS

## 2014-12-05 MED ORDER — LACTATED RINGERS IV SOLN: INTRAVENOUS | Status: DC

## 2014-12-05 MED ORDER — OXYCODONE HCL 5 MG PO TABS
5.0000 mg | ORAL_TABLET | ORAL | Status: DC | PRN
  Administered 2014-12-06 – 2014-12-07 (×5): 10 mg via ORAL
  Administered 2014-12-07: 5 mg via ORAL
  Administered 2014-12-07 – 2014-12-08 (×2): 10 mg via ORAL
  Filled 2014-12-05 (×3): qty 2
  Filled 2014-12-05: qty 1
  Filled 2014-12-05 (×4): qty 2

## 2014-12-05 MED ORDER — LEVOFLOXACIN IN D5W 750 MG/150ML IV SOLN
750.0000 mg | INTRAVENOUS | Status: AC
  Administered 2014-12-06: 750 mg via INTRAVENOUS
  Filled 2014-12-05: qty 150

## 2014-12-05 MED ORDER — PANTOPRAZOLE SODIUM 40 MG PO TBEC
40.0000 mg | DELAYED_RELEASE_TABLET | Freq: Every day | ORAL | Status: DC
  Administered 2014-12-07 – 2014-12-08 (×2): 40 mg via ORAL
  Filled 2014-12-05 (×2): qty 1

## 2014-12-05 MED ORDER — ACETAMINOPHEN 160 MG/5ML PO SOLN: 650.0000 mg | Freq: Once | ORAL | Status: AC

## 2014-12-05 MED ORDER — ANTISEPTIC ORAL RINSE SOLUTION (CORINZ)
7.0000 mL | Freq: Four times a day (QID) | OROMUCOSAL | Status: DC
  Administered 2014-12-05 – 2014-12-06 (×3): 7 mL via OROMUCOSAL

## 2014-12-05 MED ORDER — METOPROLOL TARTRATE 25 MG/10 ML ORAL SUSPENSION
12.5000 mg | Freq: Two times a day (BID) | ORAL | Status: DC
  Filled 2014-12-05 (×7): qty 5

## 2014-12-05 MED ORDER — SODIUM CHLORIDE 0.9 % IJ SOLN
OROMUCOSAL | Status: DC | PRN
  Administered 2014-12-05 (×3): 1 mL via TOPICAL

## 2014-12-05 MED ORDER — PHENYLEPHRINE HCL 10 MG/ML IJ SOLN
10.0000 mg | INTRAVENOUS | Status: DC | PRN
  Administered 2014-12-05: 10 ug/min via INTRAVENOUS

## 2014-12-05 MED ORDER — LACTATED RINGERS IV SOLN
INTRAVENOUS | Status: DC | PRN
  Administered 2014-12-05 (×2): via INTRAVENOUS

## 2014-12-05 MED ORDER — EPHEDRINE SULFATE 50 MG/ML IJ SOLN
INTRAMUSCULAR | Status: AC
  Filled 2014-12-05: qty 1

## 2014-12-05 MED ORDER — SODIUM CHLORIDE 0.9 % IJ SOLN
3.0000 mL | Freq: Two times a day (BID) | INTRAMUSCULAR | Status: DC
  Administered 2014-12-06 – 2014-12-07 (×4): 3 mL via INTRAVENOUS

## 2014-12-05 MED ORDER — DEXTROSE 5 % IV SOLN
0.0000 ug/min | INTRAVENOUS | Status: DC
  Administered 2014-12-05: 40 ug/min via INTRAVENOUS
  Administered 2014-12-06: 15 ug/min via INTRAVENOUS
  Administered 2014-12-06: 25 ug/min via INTRAVENOUS
  Filled 2014-12-05 (×5): qty 2

## 2014-12-05 MED FILL — Electrolyte-R (PH 7.4) Solution: INTRAVENOUS | Qty: 4000 | Status: AC

## 2014-12-05 MED FILL — Sodium Chloride IV Soln 0.9%: INTRAVENOUS | Qty: 2000 | Status: AC

## 2014-12-05 MED FILL — Mannitol IV Soln 20%: INTRAVENOUS | Qty: 500 | Status: AC

## 2014-12-05 MED FILL — Sodium Bicarbonate IV Soln 8.4%: INTRAVENOUS | Qty: 50 | Status: AC

## 2014-12-05 MED FILL — Heparin Sodium (Porcine) Inj 1000 Unit/ML: INTRAMUSCULAR | Qty: 10 | Status: AC

## 2014-12-05 MED FILL — Lidocaine HCl IV Inj 20 MG/ML: INTRAVENOUS | Qty: 5 | Status: AC

## 2014-12-05 SURGICAL SUPPLY — 141 items
ADAPTER CARDIO PERF ANTE/RETRO (ADAPTER) ×3 IMPLANT
APPLICATOR COTTON TIP 6IN STRL (MISCELLANEOUS) IMPLANT
BAG DECANTER FOR FLEXI CONT (MISCELLANEOUS) ×3 IMPLANT
BANDAGE ELASTIC 4 VELCRO ST LF (GAUZE/BANDAGES/DRESSINGS) ×3 IMPLANT
BANDAGE ELASTIC 6 VELCRO ST LF (GAUZE/BANDAGES/DRESSINGS) ×3 IMPLANT
BENZOIN TINCTURE PRP APPL 2/3 (GAUZE/BANDAGES/DRESSINGS) ×3 IMPLANT
BLADE STERNUM SYSTEM 6 (BLADE) ×3 IMPLANT
BLADE SURG 11 STRL SS (BLADE) ×3 IMPLANT
BLADE SURG 15 STRL LF DISP TIS (BLADE) ×2 IMPLANT
BLADE SURG 15 STRL SS (BLADE) ×1
BNDG GAUZE ELAST 4 BULKY (GAUZE/BANDAGES/DRESSINGS) ×3 IMPLANT
BOOT SUTURE AID YELLOW STND (SUTURE) IMPLANT
CANISTER SUCTION 2500CC (MISCELLANEOUS) ×3 IMPLANT
CANN PRFSN .5XCNCT 15X34-48 (MISCELLANEOUS) ×2
CANN PRFSN 3/8X14X24FR PCFC (MISCELLANEOUS) ×2
CANN PRFSN 3/8XCNCT ST RT ANG (MISCELLANEOUS) ×2
CANNULA ARTERIAL NVNT 3/8 22FR (MISCELLANEOUS) IMPLANT
CANNULA GUNDRY RCSP 15FR (MISCELLANEOUS) ×3 IMPLANT
CANNULA PRFSN .5XCNCT 15X34-48 (MISCELLANEOUS) ×2 IMPLANT
CANNULA PRFSN 3/8X14X24FR PCFC (MISCELLANEOUS) ×2 IMPLANT
CANNULA PRFSN 3/8XCNCT RT ANG (MISCELLANEOUS) ×2 IMPLANT
CANNULA SUMP PERICARDIAL (CANNULA) ×3 IMPLANT
CANNULA VEN 2 STAGE (MISCELLANEOUS) ×1
CANNULA VEN MTL TIP RT (MISCELLANEOUS) ×2
CATH CPB KIT GERHARDT (MISCELLANEOUS) ×3 IMPLANT
CATH HEART VENT LEFT (CATHETERS) ×2 IMPLANT
CATH ROBINSON RED A/P 18FR (CATHETERS) IMPLANT
CATH THORACIC 28FR (CATHETERS) ×3 IMPLANT
CATH/SQUID NICHOLS JEHLE COR (CATHETERS) IMPLANT
CLIP FOGARTY SPRING 6M (CLIP) IMPLANT
CONN 1/2X1/2X1/2  BEN (MISCELLANEOUS) ×1
CONN 1/2X1/2X1/2 BEN (MISCELLANEOUS) ×2 IMPLANT
CONN 1/2X3/8X3/8 Y GISH (MISCELLANEOUS) ×3 IMPLANT
CONN 3/8X1/2 ST GISH (MISCELLANEOUS) ×6 IMPLANT
CONN ST 1/4X3/8  BEN (MISCELLANEOUS) ×1
CONN ST 1/4X3/8 BEN (MISCELLANEOUS) ×2 IMPLANT
CONN Y 3/8X3/8X3/8  BEN (MISCELLANEOUS)
CONN Y 3/8X3/8X3/8 BEN (MISCELLANEOUS) IMPLANT
CONT SPEC 4OZ CLIKSEAL STRL BL (MISCELLANEOUS) ×3 IMPLANT
COVER MAYO STAND STRL (DRAPES) ×3 IMPLANT
CRADLE DONUT ADULT HEAD (MISCELLANEOUS) ×3 IMPLANT
DRAIN CHANNEL 28F RND 3/8 FF (WOUND CARE) ×6 IMPLANT
DRAPE CARDIOVASCULAR INCISE (DRAPES) ×1
DRAPE SLUSH/WARMER DISC (DRAPES) ×3 IMPLANT
DRAPE SRG 135X102X78XABS (DRAPES) ×2 IMPLANT
DRSG AQUACEL AG ADV 3.5X14 (GAUZE/BANDAGES/DRESSINGS) ×3 IMPLANT
ELECT BLADE 4.0 EZ CLEAN MEGAD (MISCELLANEOUS) ×3
ELECT CAUTERY BLADE 6.4 (BLADE) ×3 IMPLANT
ELECT REM PT RETURN 9FT ADLT (ELECTROSURGICAL) ×6
ELECTRODE BLDE 4.0 EZ CLN MEGD (MISCELLANEOUS) ×2 IMPLANT
ELECTRODE REM PT RTRN 9FT ADLT (ELECTROSURGICAL) ×4 IMPLANT
GAUZE SPONGE 4X4 12PLY STRL (GAUZE/BANDAGES/DRESSINGS) ×6 IMPLANT
GLOVE BIO SURGEON STRL SZ 6 (GLOVE) IMPLANT
GLOVE BIO SURGEON STRL SZ 6.5 (GLOVE) ×9 IMPLANT
GLOVE BIO SURGEON STRL SZ7 (GLOVE) IMPLANT
GLOVE BIOGEL M 6.5 STRL (GLOVE) ×6 IMPLANT
GLOVE BIOGEL M STER SZ 6 (GLOVE) ×3 IMPLANT
GLOVE BIOGEL PI IND STRL 6 (GLOVE) ×8 IMPLANT
GLOVE BIOGEL PI IND STRL 6.5 (GLOVE) ×4 IMPLANT
GLOVE BIOGEL PI IND STRL 7.0 (GLOVE) ×6 IMPLANT
GLOVE BIOGEL PI IND STRL 8 (GLOVE) ×2 IMPLANT
GLOVE BIOGEL PI INDICATOR 6 (GLOVE) ×4
GLOVE BIOGEL PI INDICATOR 6.5 (GLOVE) ×2
GLOVE BIOGEL PI INDICATOR 7.0 (GLOVE) ×3
GLOVE BIOGEL PI INDICATOR 8 (GLOVE) ×1
GOWN STRL REUS W/ TWL LRG LVL3 (GOWN DISPOSABLE) ×8 IMPLANT
GOWN STRL REUS W/TWL LRG LVL3 (GOWN DISPOSABLE) ×4
HEMOSTAT POWDER SURGIFOAM 1G (HEMOSTASIS) ×9 IMPLANT
HEMOSTAT SURGICEL 2X14 (HEMOSTASIS) ×3 IMPLANT
INSERT FOGARTY XLG (MISCELLANEOUS) IMPLANT
IV CATH 18G X1.75 CATHLON (IV SOLUTION) ×3 IMPLANT
KIT BASIN OR (CUSTOM PROCEDURE TRAY) ×3 IMPLANT
KIT CATH SUCT 8FR (CATHETERS) ×3 IMPLANT
KIT ROOM TURNOVER OR (KITS) ×3 IMPLANT
KIT SUCTION CATH 14FR (SUCTIONS) ×12 IMPLANT
KIT VASOVIEW W/TROCAR VH 2000 (KITS) ×3 IMPLANT
LEAD PACING MYOCARDI (MISCELLANEOUS) ×3 IMPLANT
LINE VENT (MISCELLANEOUS) ×3 IMPLANT
LOOP VESSEL SUPERMAXI WHITE (MISCELLANEOUS) ×3 IMPLANT
MARKER GRAFT CORONARY BYPASS (MISCELLANEOUS) ×9 IMPLANT
MATRIX HEMOSTAT SURGIFLO (HEMOSTASIS) ×3 IMPLANT
NS IRRIG 1000ML POUR BTL (IV SOLUTION) ×15 IMPLANT
PACK OPEN HEART (CUSTOM PROCEDURE TRAY) ×3 IMPLANT
PAD ARMBOARD 7.5X6 YLW CONV (MISCELLANEOUS) ×6 IMPLANT
PAD ELECT DEFIB RADIOL ZOLL (MISCELLANEOUS) ×3 IMPLANT
PENCIL BUTTON HOLSTER BLD 10FT (ELECTRODE) ×3 IMPLANT
SET CARDIOPLEGIA MPS 5001102 (MISCELLANEOUS) ×3 IMPLANT
SPONGE GAUZE 4X4 12PLY STER LF (GAUZE/BANDAGES/DRESSINGS) ×3 IMPLANT
SPONGE LAP 18X18 X RAY DECT (DISPOSABLE) ×6 IMPLANT
SUCKER WEIGHTED FLEX (MISCELLANEOUS) ×3 IMPLANT
SUT BONE WAX W31G (SUTURE) ×3 IMPLANT
SUT ETHIBON 2 0 V 52N 30 (SUTURE) ×6 IMPLANT
SUT ETHIBOND 2 0 SH (SUTURE) ×14 IMPLANT
SUT ETHIBOND 2 0 SH 36X2 (SUTURE) ×10 IMPLANT
SUT ETHIBOND 2 0 V4 (SUTURE) IMPLANT
SUT ETHIBOND 2 0V4 GREEN (SUTURE) IMPLANT
SUT PROLENE 3 0 RB 1 (SUTURE) ×6 IMPLANT
SUT PROLENE 3 0 SH 1 (SUTURE) ×3 IMPLANT
SUT PROLENE 3 0 SH1 36 (SUTURE) ×3 IMPLANT
SUT PROLENE 4 0 RB 1 (SUTURE) ×8
SUT PROLENE 4 0 TF (SUTURE) ×6 IMPLANT
SUT PROLENE 4-0 RB1 .5 CRCL 36 (SUTURE) ×16 IMPLANT
SUT PROLENE 5 0 C 1 36 (SUTURE) ×6 IMPLANT
SUT PROLENE 5 0 CC1 (SUTURE) IMPLANT
SUT PROLENE 6 0 C 1 30 (SUTURE) ×6 IMPLANT
SUT PROLENE 6 0 CC (SUTURE) ×6 IMPLANT
SUT PROLENE 7 0 BV1 MDA (SUTURE) ×3 IMPLANT
SUT PROLENE 8 0 BV175 6 (SUTURE) ×6 IMPLANT
SUT SILK  1 MH (SUTURE) ×4
SUT SILK 1 MH (SUTURE) ×8 IMPLANT
SUT SILK 1 TIES 10X30 (SUTURE) ×3 IMPLANT
SUT SILK 2 0 (SUTURE) ×1
SUT SILK 2 0 SH CR/8 (SUTURE) ×6 IMPLANT
SUT SILK 2 0 TIES 10X30 (SUTURE) ×3 IMPLANT
SUT SILK 2 0 TIES 17X18 (SUTURE) ×1
SUT SILK 2-0 18XBRD TIE 12 (SUTURE) ×2 IMPLANT
SUT SILK 2-0 18XBRD TIE BLK (SUTURE) ×2 IMPLANT
SUT SILK 3 0 SH CR/8 (SUTURE) ×3 IMPLANT
SUT SILK 4 0 (SUTURE) ×1
SUT SILK 4 0 TIE 10X30 (SUTURE) ×6 IMPLANT
SUT SILK 4-0 18XBRD TIE 12 (SUTURE) ×2 IMPLANT
SUT STEEL 6MS V (SUTURE) ×3 IMPLANT
SUT STEEL SZ 6 DBL 3X14 BALL (SUTURE) ×3 IMPLANT
SUT TEM PAC WIRE 2 0 SH (SUTURE) ×6 IMPLANT
SUT VIC AB 1 CTX 18 (SUTURE) ×6 IMPLANT
SUT VIC AB 2-0 CTX 27 (SUTURE) ×12 IMPLANT
SUT VIC AB 3-0 X1 27 (SUTURE) ×6 IMPLANT
SUTURE E-PAK OPEN HEART (SUTURE) ×3 IMPLANT
SYSTEM SAHARA CHEST DRAIN ATS (WOUND CARE) ×3 IMPLANT
TAPE CLOTH SURG 4X10 WHT LF (GAUZE/BANDAGES/DRESSINGS) ×3 IMPLANT
TOWEL OR 17X24 6PK STRL BLUE (TOWEL DISPOSABLE) ×6 IMPLANT
TOWEL OR 17X26 10 PK STRL BLUE (TOWEL DISPOSABLE) ×6 IMPLANT
TRAY FOLEY IC TEMP SENS 16FR (CATHETERS) ×3 IMPLANT
TUBE FEEDING 8FR 16IN STR KANG (MISCELLANEOUS) ×3 IMPLANT
TUBING ART PRESS 48 MALE/FEM (TUBING) ×6 IMPLANT
TUBING INSUFFLATION (TUBING) ×3 IMPLANT
UNDERPAD 30X30 INCONTINENT (UNDERPADS AND DIAPERS) ×3 IMPLANT
VALVE MAGNA MITRAL 29MM (Prosthesis & Implant Heart) ×3 IMPLANT
VENT LEFT HEART 12002 (CATHETERS) ×3
WATER STERILE IRR 1000ML POUR (IV SOLUTION) ×6 IMPLANT
YANKAUER SUCT BULB TIP NO VENT (SUCTIONS) ×3 IMPLANT

## 2014-12-05 NOTE — Transfer of Care (Signed)
Immediate Anesthesia Transfer of Care Note  Patient: Leah [name]  Procedure(s) Performed: Procedure(s) with comments: MITRAL VALVE (MV) REPLACEMENT (N/A) - Closure left atrial appendage CORONARY ARTERY BYPASS GRAFTING (CABG) (N/A) - Times 1 using left internal mammary artery to LAD TRANSESOPHAGEAL ECHOCARDIOGRAM (TEE) (N/A)  Patient Location: SICU  Anesthesia Type:General  Level of Consciousness: Patient remains intubated per anesthesia plan  Airway & Oxygen Therapy: Patient remains intubated per anesthesia plan  Post-op Assessment: Post -op Vital signs reviewed and stable  Post vital signs: stable  Last Vitals:  Filed Vitals:   12/05/14 0603  BP: 181/90  Pulse: 80  Temp: 36.5 C  Resp: 18    Complications: No apparent anesthesia complications

## 2014-12-05 NOTE — Progress Notes (Signed)
Patient ID: DEZIAH RENWICK, female   DOB: 1950-07-01, 64 y.o.   MRN: [id number] EVENING ROUNDS NOTE :     [address]       [place] [address]             [phone]                 Day of Surgery Procedure(s) (LRB): MITRAL VALVE (MV) REPLACEMENT (N/A) CORONARY ARTERY BYPASS GRAFTING (CABG) (N/A) TRANSESOPHAGEAL ECHOCARDIOGRAM (TEE) (N/A)  Total Length of Stay:  LOS: 0 days  BP 97/55 mmHg  Pulse 84  Temp(Src) 96.3 F (35.7 C) (Oral)  Resp 19  Ht 6' 1.5" (1.867 m)  Wt 172 lb (78.019 kg)  BMI 22.38 kg/m2  SpO2 99%  .Intake/Output      09/12 0701 - 09/13 0700   I.V. (mL/kg) 2779.1 (35.6)   Blood 627   IV Piggyback 1050   Total Intake(mL/kg) 4456.1 (57.1)   Urine (mL/kg/hr) 4525 (4.8)   Emesis/NG output 70 (0.1)   Blood 1000 (1.1)   Chest Tube 130 (0.1)   Total Output 5725   Net -1268.9         . sodium chloride 20 mL/hr at 12/05/14 1600  . [START ON 12/06/2014] sodium chloride    . sodium chloride 20 mL/hr at 12/05/14 1800  . dexmedetomidine Stopped (12/05/14 1748)  . DOPamine 3 mcg/kg/min (12/05/14 1500)  . insulin (NOVOLIN-R) infusion 1 Units/hr (12/05/14 1800)  . lactated ringers 20 mL/hr at 12/05/14 1800  . lactated ringers 20 mL/hr at 12/05/14 1800  . nitroGLYCERIN    . phenylephrine (NEO-SYNEPHRINE) Adult infusion 40 mcg/min (12/05/14 1800)     Lab Results  Component Value Date   WBC 16.3* 12/05/2014   HGB 9.9* 12/05/2014   HCT 29.6* 12/05/2014   PLT 109* 12/05/2014   GLUCOSE 111* 12/05/2014   CHOL 181 08/24/2014   TRIG 88 08/24/2014   HDL 33* 08/24/2014   LDLDIRECT 185* 09/09/2011   LDLCALC 130* 08/24/2014   ALT 12* 11/28/2014   AST 16 11/28/2014   NA 140 12/05/2014   K 3.1* 12/05/2014   CL 100* 12/05/2014   CREATININE 0.60 12/05/2014   BUN 10 12/05/2014   CO2 26 11/30/2014   TSH 1.756 11/28/2014   INR 1.91* 12/05/2014   HGBA1C 6.3* 12/02/2014   On vent, waking up, neuro intact Not bleeding   [name]  MD  [facility] [phone] Office [phone] 12/05/2014 7:08 PM

## 2014-12-05 NOTE — Progress Notes (Signed)
  Echocardiogram Echocardiogram Transesophageal has been performed.  Leah [name] 12/05/2014, 8:27 AM

## 2014-12-05 NOTE — Progress Notes (Signed)
Patient placed back on full support due to decreased tidal volumes and minute volume.  RT to monitor.

## 2014-12-05 NOTE — Anesthesia Preprocedure Evaluation (Addendum)
Anesthesia Evaluation  Patient identified by MRN, date of birth, ID band Patient awake    Reviewed: Allergy & Precautions, NPO status , Patient's Chart, lab work & pertinent test results, reviewed documented beta blocker date and time , Unable to perform ROS - Chart review only  Airway Mallampati: II       Dental   Pulmonary shortness of breath and with exertion, COPD,  COPD inhaler, former smoker,    breath sounds clear to auscultation       Cardiovascular hypertension, Pt. on medications and Pt. on home beta blockers + angina with exertion + CAD, + Past MI, + Peripheral Vascular Disease and +CHF   Rhythm:Regular Rate:Normal     Neuro/Psych  Headaches,  Neuromuscular disease    GI/Hepatic Neg liver ROS, GERD  ,  Endo/Other  negative endocrine ROS  Renal/GU Renal InsufficiencyRenal disease     Musculoskeletal   Abdominal   Peds  Hematology  (+) anemia ,   Anesthesia Other Findings   Reproductive/Obstetrics                            Anesthesia Physical Anesthesia Plan  ASA: IV  Anesthesia Plan: General   Post-op Pain Management:    Induction: Intravenous  Airway Management Planned: Oral ETT  Additional Equipment: Arterial line, TEE and Ultrasound Guidance Line Placement  Intra-op Plan:   Post-operative Plan: Post-operative intubation/ventilation  Informed Consent: I have reviewed the patients History and Physical, chart, labs and discussed the procedure including the risks, benefits and alternatives for the proposed anesthesia with the patient or authorized representative who has indicated his/her understanding and acceptance.     Plan Discussed with: CRNA and Surgeon  Anesthesia Plan Comments:         Anesthesia Quick Evaluation

## 2014-12-05 NOTE — Anesthesia Procedure Notes (Addendum)
Procedure Name: Intubation Date/Time: 12/05/2014 7:48 AM Performed by: Lavell Luster Pre-anesthesia Checklist: Patient identified, Emergency Drugs available, Suction available, Patient being monitored and Timeout performed Patient Re-evaluated:Patient Re-evaluated prior to inductionOxygen Delivery Method: Circle system utilized Preoxygenation: Pre-oxygenation with 100% oxygen Intubation Type: IV induction Ventilation: Mask ventilation without difficulty Laryngoscope Size: Miller and 2 Grade View: Grade I Tube type: Oral Tube size: 8.0 mm Number of attempts: 1 Airway Equipment and Method: Stylet Placement Confirmation: ETT inserted through vocal cords under direct vision,  breath sounds checked- equal and bilateral and positive ETCO2 Secured at: 20 cm Tube secured with: Tape Dental Injury: Teeth and Oropharynx as per pre-operative assessment

## 2014-12-05 NOTE — H&P (Signed)
[address]       [place] [address]             [phone]                    Leah [name] [place] Medical Record #[id number] Date of Birth: 1950/07/22  Referring: Peter Martinique MD Primary Care: Kathrine Cords, MD  Chief Complaint:    SOB   History of Present Illness:   his is a 64 y/o Leah 64 y.o. [name]  Has been followed  for  Consideration of mitral and aortic valve replacement and CABG. Patient was recently in hospital. Patient has history   of CAD, (s/p PCI with stenting of the mid LAD in June of 2009 using a 2.5 x 12 mm Promus stent). She has a history of peripheral vascular disease. She has had a left carotid endarterectomy in August of 2009, right external iliac stenting and bilateral renal artery stenting in 2009, and left femoropopliteal bypass grafting by Dr. Kellie Simmering in 2012. In August 2013, she had chest pain. A Myoview study showed mild apical ischemia. According to medical records, she presented to the ER on 08/22/14 with chest pain. Cardiac cath done 08/24/14 revealed distal stent restenosis of 70% that was not felt to be hemodynamically significant and the plan is for medical treatment. She was seen then seen in follow up 09/07/14 and was doing well. An echo done during then showed an EF of 60-65%, with moderate to severe MR, and mild to moderate AR, and grade 2 diastolic dysfunction.  She presented to ER on 10/21/2014 with complaints of mild heaviness in her chest and shortness of breath on 10/21/2014. Her BNP was 1223 and Troponin I was initially 0.08 and slightly increased to 0.11. CXR showed mild cardiomegaly, central pulmonary vascular congestion and mild bilateral interstitial edema, small bilateral pleural effusions, and a spiculated nodular density on the right (about 5 mm) seen on previous CT was not seen on CXR. TEE done on 10/24/2014 showed  LVEF to 55-60%, moderate AI, severe MR, moderately calcified aorta, and no  evidence of thrombus in LA. She is on Plavix since 2009 for coronary stent. . Patient denies known rheumatic fever as child, but tee suggests rheumatic valve disease  Patient is a current smoker up until this admission. Her smoking history and family history of her mother dying of lung cancer makes the right upper lung nodule a high risk finding although small lesion.    Patient very proud that she has not been smoking since admission in July  and continues to stay tobacco free.  Patient was readmitted with confusion 10 dys ago, work up by neurology was negative. She has stopped welbutrin and had no further symptoms. Presents today for surgery that was delayed by previous admission.   Current Activity/ Functional Status:  Patient is independent with mobility/ambulation, transfers, ADL's, IADL's.   Zubrod Score: At the time of surgery this patient's most appropriate activity status/level should be described as: '[]'$     0    Normal activity, no symptoms '[x]'$     1    Restricted in physical strenuous activity but ambulatory, able to do out light work '[]'$     2    Ambulatory and capable of self care, unable to do work activities, up and about               >50 % of waking hours                              '[]'$   3    Only limited self care, in bed greater than 50% of waking hours '[]'$     4    Completely disabled, no self care, confined to bed or chair '[]'$     5    Moribund   Past Medical History  Diagnosis Date  . Hyperlipidemia   . Hypertension   . GERD (gastroesophageal reflux disease)   . Leg pain   . Headache(784.0)   . Carotid artery occlusion   . COPD (chronic obstructive pulmonary disease)   . CAD (coronary artery disease)   . Peripheral vascular disease   . Tobacco abuse   . Shortness of breath dyspnea   . Renal vascular disease 10/26/2014    bilateral stents placed   . Anemia   . Severe mitral regurgitation     Past Surgical History  Procedure Laterality Date  . Carotid  endarterectomy  11/21/2007    left  . Angioplasty / stenting iliac  2010    right external iliac by Dr. Irish Lack  . Coronary angioplasty with stent placement  6/09    LAD 2.5x12 Promus  . Femoral-popliteal bypass graft  10/12    left Dr. Kellie Simmering  . Abdominal hysterectomy    . Cardiac catheterization  11/13/11    Left Heart Cath. with Coronary Angiogram  . Cardiac catheterization N/A 08/24/2014    Procedure: Left Heart Cath and Coronary Angiography;  Surgeon: Wellington Hampshire, MD;  Location: [facility];  Service: Cardiovascular;  Laterality: N/A;  . Renal artery stent Bilateral   . Tee without cardioversion N/A 10/24/2014    Procedure: TRANSESOPHAGEAL ECHOCARDIOGRAM (TEE);  Surgeon: Thayer Headings, MD;  Location: [facility];  Service: Cardiovascular;  Laterality: N/A;  . Cardiac catheterization N/A 10/26/2014    Procedure: Right/Left Heart Cath and Coronary Angiography;  Surgeon: Peter M Martinique, MD; oLAD 70%, mLAD 70% ISR, D2 30%, OFC 30%, RCA 20%, EF nl, low R heart pressures after diuresis, severe MR    Family History  Problem Relation Age of Onset  . Cancer Mother     BRAIN AND LUNG  . Hypertension Father   . Heart disease Father   . Heart attack Neg Hx   . Stroke Paternal Aunt     great aunt  . Hypertension Mother   . Hypertension Brother     Social History   Social History  . Marital Status: Divorced    Spouse Name: N/A  . Number of Children: 4  . Years of Education: N/A   Occupational History  .  Lab [employer]   Social History Main Topics  . Smoking status: Former Smoker -- 0.50 packs/day for 35 years    Types: Cigarettes    Quit date: 10/31/2014  . Smokeless tobacco: Never Used     Comment: has not started wellbutrin yet  . Alcohol Use: No  . Drug Use: No  . Sexual Activity: No   Other Topics Concern  . Not on file   Social History Narrative    History  Smoking status  . Former Smoker -- 0.50 packs/day for 35 years  . Types: Cigarettes  . Quit date:  10/31/2014  Smokeless tobacco  . Never Used    Comment: has not started wellbutrin yet    History  Alcohol Use No     Allergies  Allergen Reactions  . Lisinopril Swelling    Angioedema 06/10/11  . Aspirin Other (See Comments)    Upset stomach  . Chantix [Varenicline]  insomnia  . Penicillins Hives    Current Facility-Administered Medications  Medication Dose Route Frequency Provider Last Rate Last Dose  . aminocaproic acid (AMICAR) 10 g in sodium chloride 0.9 % 100 mL infusion   Intravenous To OR [name], MD      . chlorhexidine (HIBICLENS) 4 % liquid 2 application  30 mL Topical UD [name], MD      . dexmedetomidine (PRECEDEX) 400 MCG/100ML (4 mcg/mL) infusion  0.1-0.7 mcg/kg/hr Intravenous To OR [name], MD      . DOPamine (INTROPIN) 800 mg in dextrose 5 % 250 mL (3.2 mg/mL) infusion  0-10 mcg/kg/min Intravenous To OR [name], MD      . EPINEPHrine (ADRENALIN) 4 mg in dextrose 5 % 250 mL (0.016 mg/mL) infusion  0-10 mcg/min Intravenous To OR [name], MD      . heparin 2,500 Units, papaverine 30 mg in electrolyte-148 (PLASMALYTE-148) 500 mL irrigation   Irrigation To OR [name], MD      . heparin 30,000 units/NS 1000 mL solution for CELLSAVER   Other To OR [name], MD      . insulin regular (NOVOLIN R,HUMULIN R) 250 Units in sodium chloride 0.9 % 250 mL (1 Units/mL) infusion   Intravenous To OR [name], MD      . levofloxacin (LEVAQUIN) IVPB 500 mg  500 mg Intravenous To OR [name], MD      . magnesium sulfate (IV Push/IM) injection 40 mEq  40 mEq Other To OR [name], MD      . metoprolol tartrate (LOPRESSOR) tablet 12.5 mg  12.5 mg Oral Once [name], MD   12.5 mg at 12/05/14 0551  . nitroGLYCERIN 50 mg in dextrose 5 % 250 mL (0.2 mg/mL) infusion  2-200 mcg/min Intravenous To OR [name], MD      . phenylephrine (NEO-SYNEPHRINE) 20 mg in dextrose 5 % 250 mL  (0.08 mg/mL) infusion  30-200 mcg/min Intravenous To OR [name], MD      . potassium chloride injection 80 mEq  80 mEq Other To OR [name], MD      . vancomycin (VANCOCIN) 1,250 mg in sodium chloride 0.9 % 250 mL IVPB  1,250 mg Intravenous To OR [name], MD   1,250 mg at 12/05/14 0539   Facility-Administered Medications Ordered in Other Encounters  Medication Dose Route Frequency Provider Last Rate Last Dose  . lactated ringers infusion    Continuous PRN Mosie Epstein, CRNA      . lactated ringers infusion    Continuous PRN Mosie Epstein, CRNA          Review of Systems:     Cardiac Review of Systems: Y or N  Chest Pain [ y   ]  Resting SOB [ y  ] Exertional SOB  [ y ]  55 [ y ]   Pedal Edema [ y  ]    Palpitations [ y ] Syncope  [ n ]   Presyncope [n   ]  General Review of Systems: [Y] = yes [  ]=no Constitional: recent weight change [  ];  Wt loss over the last 3 months [   ] anorexia [  ]; fatigue Blue.Reese  ]; nausea [  ]; night sweats [  ]; fever [  ]; or chills [  ];  Dental: poor dentition[  ]; Last Dentist visit:   Eye : blurred vision [  ]; diplopia [   ]; vision changes [  ];  Amaurosis fugax[  ]; Resp: cough [  ];  wheezing[  ];  hemoptysis[  ]; shortness of breath[  ]; paroxysmal nocturnal dyspnea[  ]; dyspnea on exertion[  ]; or orthopnea[  ];  GI:  gallstones[  ], vomiting[  ];  dysphagia[  ]; melena[  ];  hematochezia [  ]; heartburn[  ];   Hx of  Colonoscopy[  ]; GU: kidney stones [  ]; hematuria[  ];   dysuria [  ];  nocturia[  ];  history of     obstruction [  ]; urinary frequency [  ]             Skin: rash, swelling[  ];, hair loss[  ];  peripheral edema[  ];  or itching[  ]; Musculosketetal: myalgias[  ];  joint swelling[  ];  joint erythema[  ];  joint pain[  ];  back pain[  ];  Heme/Lymph: bruising[  ];  bleeding[  ];  anemia[  ];  Neuro: TIA[  ];  headaches[  ];  stroke[  ];  vertigo[  ];  seizures[  ];   paresthesias[  ];   difficulty walking[ n ];  Psych:depression[  ]; anxiety[  ];  Endocrine: diabetes[  ];  thyroid dysfunction[  ];  Immunizations: Flu up to date [ ? ]; Pneumococcal up to date [  /];  Other:  Physical Exam: BP 181/90 mmHg  Pulse 80  Temp(Src) 97.7 F (36.5 C) (Oral)  Resp 18  Ht 6' 1.5" (1.867 m)  Wt 172 lb (78.019 kg)  BMI 22.38 kg/m2  SpO2 100%  PHYSICAL EXAMINATION: General appearance: alert, cooperative and appears stated age Head: Normocephalic, without obvious abnormality, atraumatic Neck: no adenopathy, no carotid bruit, no JVD, supple, symmetrical, trachea midline and thyroid not enlarged, symmetric, no tenderness/mass/nodules Lymph nodes: Cervical, supraclavicular, and axillary nodes normal. Resp: clear to auscultation bilaterally Back: symmetric, no curvature. ROM normal. No CVA tenderness. Cardio: regular rate and rhythm and systolic murmur: late systolic 3/6, blowing at 2nd right intercostal space GI: soft, non-tender; bowel sounds normal; no masses,  no organomegaly Genitalia: defer exam Extremities: extremities normal, atraumatic, no cyanosis or edema and Homans sign is negative, no sign of DVT Neurologic: Grossly normal Incision and left leg from previous femoral popliteal bypass  Diagnostic Studies & Laboratory data:     Recent Radiology Findings:   Dg Chest 2 View  11/28/2014   CLINICAL DATA:  Shortness of breath for 2 days.  EXAM: CHEST  2 VIEW  COMPARISON:  11/25/2014 and prior chest radiographs dating back to 01/16/2011  FINDINGS: The cardiomediastinal silhouette is unremarkable.  COPD/emphysema changes again identified.  There is no evidence of focal airspace disease, pulmonary edema, suspicious pulmonary nodule/mass, pleural effusion, or pneumothorax. No acute bony abnormalities are identified.  IMPRESSION: COPD/emphysema without evidence of acute cardiopulmonary disease.   Electronically Signed   By: Margarette Canada M.D.   On: 11/28/2014 08:32   Dg Chest 2  View  11/25/2014   CLINICAL DATA:  Aortic insufficiency, preoperative evaluation for AVR, history mitral insufficiency, coronary artery disease, hypertension, hyperlipidemia, former smoker, COPD, peripheral vascular disease  EXAM: CHEST  2 VIEW  COMPARISON:  11/10/2014  FINDINGS: Normal heart size and pulmonary vascularity.  Coronary arterial stent noted.  Tortuosity of thoracic aorta.  Hyperinflation and minimal peribronchial  thickening.  No pulmonary infiltrate, pleural effusion or pneumothorax.  Osseous demineralization.  IMPRESSION: Hyperinflation and minimal chronic peribronchial thickening could represent mild COPD changes.  No acute infiltrate.   Electronically Signed   By: Lavonia Dana M.D.   On: 11/25/2014 09:43   Dg Chest 2 View  11/10/2014   CLINICAL DATA:  Coronary artery disease.  EXAM: CHEST  2 VIEW  COMPARISON:  10/23/2014 and 729 1,016  FINDINGS: Heart size and pulmonary vascularity are normal and the lungs are clear. Pleural effusions present on the prior exam have resolved. No osseous abnormality.  IMPRESSION: No active cardiopulmonary disease.   Electronically Signed   By: Lorriane Shire M.D.   On: 11/10/2014 14:19   Ct Head Wo Contrast  11/28/2014   CLINICAL DATA:  Dizziness, slurred speech, confusion. Impending aortic valve replacement surgery.  EXAM: CT HEAD WITHOUT CONTRAST  TECHNIQUE: Contiguous axial images were obtained from the base of the skull through the vertex without intravenous contrast.  COMPARISON:  None.  FINDINGS: Mild atrophy with diffuse sulcal prominence. Scattered calcifications within the bilateral basal ganglia. Gray-white differentiation is otherwise well maintained without CT evidence of acute large territory infarct. Exuberant calcifications about the midline falx. No intraparenchymal or extra-axial mass or hemorrhage. Normal size and configuration of the ventricles and basilar cisterns. No midline shift. Limited visualization of the paranasal sinuses and mastoid air  cells is normal. No air-fluid levels. Regional soft tissues appear normal. No displaced calvarial fracture.  IMPRESSION: Mild atrophy without acute intracranial process.   Electronically Signed   By: Sandi Mariscal M.D.   On: 11/28/2014 08:49   Mr Brain Wo Contrast  11/28/2014   CLINICAL DATA:  64 year old [name] with altered speech and mental status since Friday. Intermittent dizziness. Planned aortic valve replacement tomorrow. Initial encounter.  EXAM: MRI HEAD WITHOUT CONTRAST  TECHNIQUE: Multiplanar, multiecho pulse sequences of the brain and surrounding structures were obtained without intravenous contrast.  COMPARISON:  Head CT without contrast 0837 hours today.  FINDINGS: Incidental bulky dural calcifications along the interhemispheric fissure. No restricted diffusion to suggest acute infarction. No midline shift, mass effect, evidence of mass lesion, ventriculomegaly, extra-axial collection or acute intracranial hemorrhage. Cervicomedullary junction and pituitary are within normal limits. Major intracranial vascular flow voids are within normal limits.  Scattered small bilateral cerebral white matter T2 and FLAIR hyperintense foci, mostly subcortical. The configuration is nonspecific an the extent is moderate for age. No cortical encephalomalacia or chronic cerebral blood products. Deep gray matter nuclei, brainstem, cerebellum, and visualized internal auditory structures appear normal.  Paranasal sinuses and mastoids are clear. Negative orbits soft tissues. Negative scalp soft tissues. Normal bone marrow signal. Negative visualized cervical spine.  IMPRESSION: 1.  No acute intracranial abnormality. 2. Moderate for age nonspecific cerebral white matter signal changes, most commonly due to chronic small vessel disease.   Electronically Signed   By: Genevie Ann M.D.   On: 11/28/2014 09:32    CT of Chest 10/27/2014 CLINICAL DATA: Re-evaluate right upper lobe pulmonary nodule, prior to possible valve surgery.  Initial encounter.  EXAM: CT CHEST WITHOUT CONTRAST  TECHNIQUE: Multidetector CT imaging of the chest was performed following the standard protocol without IV contrast.  COMPARISON: CT of the chest performed 08/25/2014  FINDINGS: The small slightly spiculated nodule at the right lung apex is again seen; there is a small associated cystic component which appears to have increased mildly in size, while the solid component has decreased slightly in size, measuring approximately 5 mm.  Given the slight apparent interval decrease in size of the solid component from the recent prior study, this is thought more likely to be postinfectious in nature, though malignancy cannot be entirely excluded.  No additional pulmonary nodules are seen. The lungs are otherwise clear, aside from mild scarring or atelectasis at the left lung base. No pleural effusion or pneumothorax is seen.  Scattered coronary artery calcifications are noted. The mediastinum is otherwise unremarkable. No mediastinal lymphadenopathy is seen. No pericardial effusion is identified. The great vessels are grossly unremarkable, aside from scattered calcification. Mild calcification is noted along the thoracic aorta. The visualized portions of the thyroid gland are unremarkable. No axillary lymphadenopathy is appreciated.  No acute osseous abnormalities are identified.  IMPRESSION: 1. Small slightly spiculated nodule at the right lung apex is again seen. It has a small cystic component which appears to have increased mildly in size, while the solid component has decreased slightly in size, measuring approximately 5 mm. Given the slight apparent interval decrease in size of the solid component from the recent prior CT, this is thought more likely to be postinfectious in nature, though malignancy cannot be entirely excluded. As this remains too small to further characterize, would recommend strict follow-up CT of  the chest in 3-6 months, to ensure stability. 2. Mild scarring or atelectasis at the left lung base. 3. Scattered coronary artery calcifications seen.  I have independently reviewed the above radiologic studies TEE: Study Conclusions  - Left ventricle: Systolic function was normal. The estimated ejection fraction was in the range of 55% to 60%. - Aortic valve: There was moderate regurgitation. - Mitral valve: There was severe regurgitation. - Left atrium: No evidence of thrombus in the atrial cavity or appendage.  Diagnostic transesophageal echocardiography. 2D and color Doppler. Birthdate: Patient birthdate: [birth year]. Age: Patient is 64 yr old. Sex: Gender: [name].  BMI: 22.4 kg/m^2. Blood pressure: 159/66 Patient status: Inpatient. Study date: Study date: 10/24/2014. Study time: 10:03 AM. Location: Endoscopy.  -------------------------------------------------------------------  ------------------------------------------------------------------- Left ventricle: Systolic function was normal. The estimated ejection fraction was in the range of 55% to 60%.  ------------------------------------------------------------------- Aortic valve:  The valve appears to be grossly normal.  Doppler: There was moderate regurgitation.  ------------------------------------------------------------------- Aorta: There is moderate atheroma in the thoracic aorta. The aorta was moderately calcified.  ------------------------------------------------------------------- Mitral valve:  The valve appears to be grossly normal.  Doppler: There was severe regurgitation.  ------------------------------------------------------------------- Left atrium: The atrium was normal in size. No evidence of thrombus in the atrial cavity or appendage.  ------------------------------------------------------------------- Post procedure conclusions Ascending Aorta:  - There is  moderate atheroma in the thoracic aorta. The aorta was moderately calcified.   Cardiac Cath: Dominance: Right   Left Main  The vessel is angiographically normal.     Left Anterior Descending   . Ost LAD lesion, 70% stenosed. discrete .   Marland Kitchen Mid LAD to Dist LAD lesion, 70% stenosed. located at the bend . The lesion was previously treated with a drug-eluting stent greater than two years ago.   . First Diagonal Branch   There is mild in the vessel.   . Second Diagonal Branch   . 2nd Diag lesion, 30% stenosed.   . Third Diagonal Branch   The vessel is small in size.     Left Circumflex  There is mild the vessel.   Colon Flattery Cx lesion, 30% stenosed.   . Mid Cx lesion, 30% stenosed.   . First Obtuse Marginal Branch   The vessel  is small in size.   Marland Kitchen Second Obtuse Marginal Branch   The vessel is small in size.   . Third Obtuse Marginal Branch   There is mild in the vessel.     Right Coronary Artery   . Mid RCA lesion, 20% stenosed.   . Right Posterior Descending Artery   There is mild in the vessel.   . Right Posterior Atrioventricular Branch   The vessel exhibits minimal luminal irregularities.   . First Right Posterolateral   The vessel exhibits minimal luminal irregularities.   . Second Right Posterolateral   The vessel exhibits minimal luminal irregularities.       Right Heart Pressures LV EDP is normal. Initially filling pressures were very low due to aggressive diuresis. In the lab the patient received a 250 cc bolus of IV fluid as well as approximately 80 cc of contrast. With fluid challenge PCWP increased from 5/4 to 17/15 mm Hg and PA pressure increased from 22/6 mm Hg to 31/8 mm Hg. Right heart pressures are normal.     PFT's 10/2014 FEV1 2.18  77%  DLCO 11.38 32% The diffusion defect and reduced lung volumes suggest an early parenchymal process. Minimal airway obstruction is present. In view of the severity of the diffusion defect, studies with exercise would  be helpful to evaluate the presence of hypoxemia. Pulmonary Function Diagnosis: Minimal Obstructive Airways Disease Insignificant response to bronchodilator Mild Restriction -Parenchymal Severe Diffusion Defect Recent Lab Findings: Lab Results  Component Value Date   WBC 11.2* 11/28/2014   HGB 12.8 11/28/2014   HCT 39.1 11/28/2014   PLT 122* 11/28/2014   GLUCOSE 97 11/30/2014   CHOL 181 08/24/2014   TRIG 88 08/24/2014   HDL 33* 08/24/2014   LDLDIRECT 185* 09/09/2011   LDLCALC 130* 08/24/2014   ALT 12* 11/28/2014   AST 16 11/28/2014   NA 137 11/30/2014   K 3.7 11/30/2014   CL 101 11/30/2014   CREATININE 1.09* 11/30/2014   BUN 15 11/30/2014   CO2 26 11/30/2014   TSH 1.756 11/28/2014   INR 1.15 12/02/2014   HGBA1C 6.3* 12/02/2014      Assessment / Plan:   Severe mitral insufficiency with scarred mitral valve suggestive of rheumatic fever, at least moderate aortic insufficiency and recurrent coronary artery disease. I recommended to the patient that we proceed with aortic and mitral valve replacement and coronary artery bypass grafting. The risks and options of surgery have been discussed with the patient on several visits.  We discussed use of mechanical versus tissue valves and she prefers tissue valve.   The goals risks and alternatives of the planned surgical procedure CABG,AVR, MVR have been discussed with the patient in detail. The risks of the procedure including death, infection, stroke, myocardial infarction, bleeding, blood transfusion  Poss permanent pacemakerhave all been discussed specifically.  I have quoted Leah [name] a 6 % of perioperative mortality and a complication rate as high as 40 %. The patient's questions have been answered.Leah [name] is willing  to proceed with the planned procedure.    [name] MD      [address] [place] [address] Office [phone]   Beeper [phone]  12/05/2014 7:24 AM

## 2014-12-05 NOTE — Progress Notes (Signed)
Utilization Review Completed.Donne Anon T9/02/2015

## 2014-12-05 NOTE — Anesthesia Postprocedure Evaluation (Signed)
  Anesthesia Post-op Note  Patient: Fujiko Picazo [facility]  Procedure(s) Performed: Procedure(s) with comments: MITRAL VALVE (MV) REPLACEMENT (N/A) - Closure left atrial appendage CORONARY ARTERY BYPASS GRAFTING (CABG) (N/A) - Times 1 using left internal mammary artery to LAD TRANSESOPHAGEAL ECHOCARDIOGRAM (TEE) (N/A)  Patient Location: SICU  Anesthesia Type:General  Level of Consciousness: sedated and Patient remains intubated per anesthesia plan  Airway and Oxygen Therapy: Patient remains intubated per anesthesia plan and Patient placed on Ventilator (see vital sign flow sheet for setting)  Post-op Pain: none  Post-op Assessment: Post-op Vital signs reviewed, Patient's Cardiovascular Status Stable and Respiratory Function Stable              Post-op Vital Signs: stable  Last Vitals:  Filed Vitals:   12/05/14 1515  BP:   Pulse: 72  Temp: 35.9 C  Resp: 12    Complications: No apparent anesthesia complications

## 2014-12-05 NOTE — Brief Op Note (Addendum)
12/05/2014  12:47 PM  PATIENT:  Clela Hagadorn Mehlman  64 y.o. female  PRE-OPERATIVE DIAGNOSIS:  SEVERE MR, AI CAD  POST-OPERATIVE DIAGNOSIS:  SEVERE MR, mild AI CAD  PROCEDURE:   MITRAL VALVE REPLACEMENT (29 mm Edwards Magna Ease pericardial tissue valve) CORONARY ARTERY BYPASS GRAFTING x 1  (LIMA-LAD)  SURGEON:  [name], MD  ASSISTANT: Suzzanne Cloud, PA-C  ANESTHESIA:   general   EBL: See anesthesia and perfusion records  PATIENT CONDITION:  ICU - intubated and hemodynamically stable.  PRE-OPERATIVE WEIGHT:  78 kg    Mitral Valve Etiology  MV Insufficiency: Severe  MV Disease: Yes.  MV Stenosis: No mitral valve stenosis.  MV Disease Functional Class: MV Disease Functional Class: Type IIIb.  Etiology (Choose at least one and up to five): Rheumatic.  MV Lesions (Choose at least one): Leaflet prolapse, anterior. , Leaflet thickening/retraction., Chordal tethering.  and Chordal thickening/retraction/fusion.   Mitral Valve Procedure  Mitral Valve Procedure Performed:  Replacement: Repair not attempted prior to replacement.  Posterior Mitral Chords preserved.  Implant: Bioprosthetic Valve: Implant model number [id number], Size 29, Unique Device Identifier [id number].

## 2014-12-05 NOTE — OR Nursing (Signed)
SICU 45 min call @ 1316

## 2014-12-05 NOTE — Progress Notes (Signed)
Pt starting to wake up, attempted to wean pt to Rate of 4 and 40%, pt fell back asleep and would go apneic. RT placed back on a rate of 12 and 50% and will attempt again when pt more awake.

## 2014-12-05 NOTE — Progress Notes (Signed)
[address]       [place] [address]             [phone]                    Leah [name] [place] Medical Record #[id number] Date of Birth: Jun 15, 1950  Referring: Peter Martinique MD Primary Care: Kathrine Cords, MD  Chief Complaint:    SOB   History of Present Illness:   his is a 64 y/o [place] 64 y.o. female  Has been followed  for  Consideration of mitral and aortic valve replacement and CABG. Patient was recently in hospital. Patient has history   of CAD, (s/p PCI with stenting of the mid LAD in June of 2009 using a 2.5 x 12 mm Promus stent). She has a history of peripheral vascular disease. She has had a left carotid endarterectomy in August of 2009, right external iliac stenting and bilateral renal artery stenting in 2009, and left femoropopliteal bypass grafting by Dr. Kellie Simmering in 2012. In August 2013, she had chest pain. A Myoview study showed mild apical ischemia. According to medical records, she presented to the ER on 08/22/14 with chest pain. Cardiac cath done 08/24/14 revealed distal stent restenosis of 70% that was not felt to be hemodynamically significant and the plan is for medical treatment. She was seen then seen in follow up 09/07/14 and was doing well. An echo done during then showed an EF of 60-65%, with moderate to severe MR, and mild to moderate AR, and grade 2 diastolic dysfunction.  She presented to ER on 10/21/2014 with complaints of mild heaviness in her chest and shortness of breath on 10/21/2014. Her BNP was 1223 and Troponin I was initially 0.08 and slightly increased to 0.11. CXR showed mild cardiomegaly, central pulmonary vascular congestion and mild bilateral interstitial edema, small bilateral pleural effusions, and a spiculated nodular density on the right (about 5 mm) seen on previous CT was not seen on CXR. TEE done on 10/24/2014 showed  LVEF to 55-60%, moderate AI, severe MR, moderately calcified aorta, and no  evidence of thrombus in LA. She is on Plavix since 2009 for coronary stent. . Patient denies known rheumatic fever as child, but tee suggests rheumatic valve disease  Patient is a current smoker up until this admission. Her smoking history and family history of her mother dying of lung cancer makes the right upper lung nodule a high risk finding although small lesion.    Patient very proud that she has not been smoking since admission in July  and continues to stay tobacco free.  Patient was readmitted with confusion 10 dys ago, work up by neurology was negative. She has stopped welbutrin and had no further symptoms. Presents today for surgery that was delayed by previous admission.   Current Activity/ Functional Status:  Patient is independent with mobility/ambulation, transfers, ADL's, IADL's.   Zubrod Score: At the time of surgery this patient's most appropriate activity status/level should be described as: '[]'$     0    Normal activity, no symptoms '[x]'$     1    Restricted in physical strenuous activity but ambulatory, able to do out light work '[]'$     2    Ambulatory and capable of self care, unable to do work activities, up and about               >50 % of waking hours                              '[]'$   3    Only limited self care, in bed greater than 50% of waking hours '[]'$     4    Completely disabled, no self care, confined to bed or chair '[]'$     5    Moribund   Past Medical History  Diagnosis Date  . Hyperlipidemia   . Hypertension   . GERD (gastroesophageal reflux disease)   . Leg pain   . Headache(784.0)   . Carotid artery occlusion   . COPD (chronic obstructive pulmonary disease)   . CAD (coronary artery disease)   . Peripheral vascular disease   . Tobacco abuse   . Shortness of breath dyspnea   . Renal vascular disease 10/26/2014    bilateral stents placed   . Anemia   . Severe mitral regurgitation     Past Surgical History  Procedure Laterality Date  . Carotid  endarterectomy  11/21/2007    left  . Angioplasty / stenting iliac  2010    right external iliac by Dr. Irish Lack  . Coronary angioplasty with stent placement  6/09    LAD 2.5x12 Promus  . Femoral-popliteal bypass graft  10/12    left Dr. Kellie Simmering  . Abdominal hysterectomy    . Cardiac catheterization  11/13/11    Left Heart Cath. with Coronary Angiogram  . Cardiac catheterization N/A 08/24/2014    Procedure: Left Heart Cath and Coronary Angiography;  Surgeon: Wellington Hampshire, MD;  Location: [facility];  Service: Cardiovascular;  Laterality: N/A;  . Renal artery stent Bilateral   . Tee without cardioversion N/A 10/24/2014    Procedure: TRANSESOPHAGEAL ECHOCARDIOGRAM (TEE);  Surgeon: Thayer Headings, MD;  Location: [facility];  Service: Cardiovascular;  Laterality: N/A;  . Cardiac catheterization N/A 10/26/2014    Procedure: Right/Left Heart Cath and Coronary Angiography;  Surgeon: Peter M Martinique, MD; oLAD 70%, mLAD 70% ISR, D2 30%, OFC 30%, RCA 20%, EF nl, low R heart pressures after diuresis, severe MR    Family History  Problem Relation Age of Onset  . Cancer Mother     BRAIN AND LUNG  . Hypertension Father   . Heart disease Father   . Heart attack Neg Hx   . Stroke Paternal Aunt     great aunt  . Hypertension Mother   . Hypertension Brother     Social History   Social History  . Marital Status: Divorced    Spouse Name: N/A  . Number of Children: 4  . Years of Education: N/A   Occupational History  .  Lab [employer]   Social History Main Topics  . Smoking status: Former Smoker -- 0.50 packs/day for 35 years    Types: Cigarettes    Quit date: 10/31/2014  . Smokeless tobacco: Never Used     Comment: has not started wellbutrin yet  . Alcohol Use: No  . Drug Use: No  . Sexual Activity: No   Other Topics Concern  . Not on file   Social History Narrative    History  Smoking status  . Former Smoker -- 0.50 packs/day for 35 years  . Types: Cigarettes  . Quit date:  10/31/2014  Smokeless tobacco  . Never Used    Comment: has not started wellbutrin yet    History  Alcohol Use No     Allergies  Allergen Reactions  . Lisinopril Swelling    Angioedema 06/10/11  . Aspirin Other (See Comments)    Upset stomach  . Chantix [Varenicline]  insomnia  . Penicillins Hives    Current Facility-Administered Medications  Medication Dose Route Frequency Provider Last Rate Last Dose  . aminocaproic acid (AMICAR) 10 g in sodium chloride 0.9 % 100 mL infusion   Intravenous To OR [name], MD      . chlorhexidine (HIBICLENS) 4 % liquid 2 application  30 mL Topical UD [name], MD      . dexmedetomidine (PRECEDEX) 400 MCG/100ML (4 mcg/mL) infusion  0.1-0.7 mcg/kg/hr Intravenous To OR [name], MD      . DOPamine (INTROPIN) 800 mg in dextrose 5 % 250 mL (3.2 mg/mL) infusion  0-10 mcg/kg/min Intravenous To OR [name], MD      . EPINEPHrine (ADRENALIN) 4 mg in dextrose 5 % 250 mL (0.016 mg/mL) infusion  0-10 mcg/min Intravenous To OR [name], MD      . heparin 2,500 Units, papaverine 30 mg in electrolyte-148 (PLASMALYTE-148) 500 mL irrigation   Irrigation To OR [name], MD      . heparin 30,000 units/NS 1000 mL solution for CELLSAVER   Other To OR [name], MD      . insulin regular (NOVOLIN R,HUMULIN R) 250 Units in sodium chloride 0.9 % 250 mL (1 Units/mL) infusion   Intravenous To OR [name], MD      . levofloxacin (LEVAQUIN) IVPB 500 mg  500 mg Intravenous To OR [name], MD      . magnesium sulfate (IV Push/IM) injection 40 mEq  40 mEq Other To OR [name], MD      . metoprolol tartrate (LOPRESSOR) tablet 12.5 mg  12.5 mg Oral Once [name], MD   12.5 mg at 12/05/14 0551  . nitroGLYCERIN 50 mg in dextrose 5 % 250 mL (0.2 mg/mL) infusion  2-200 mcg/min Intravenous To OR [name], MD      . phenylephrine (NEO-SYNEPHRINE) 20 mg in dextrose 5 % 250 mL  (0.08 mg/mL) infusion  30-200 mcg/min Intravenous To OR [name], MD      . potassium chloride injection 80 mEq  80 mEq Other To OR [name], MD      . vancomycin (VANCOCIN) 1,250 mg in sodium chloride 0.9 % 250 mL IVPB  1,250 mg Intravenous To OR [name], MD   1,250 mg at 12/05/14 7867   Facility-Administered Medications Ordered in Other Encounters  Medication Dose Route Frequency Provider Last Rate Last Dose  . lactated ringers infusion    Continuous PRN Mosie Epstein, CRNA      . lactated ringers infusion    Continuous PRN Mosie Epstein, CRNA          Review of Systems:     Cardiac Review of Systems: Y or N  Chest Pain [ y   ]  Resting SOB [ y  ] Exertional SOB  [ y ]  50 [ y ]   Pedal Edema [ y  ]    Palpitations [ y ] Syncope  [ n ]   Presyncope [n   ]  General Review of Systems: [Y] = yes [  ]=no Constitional: recent weight change [  ];  Wt loss over the last 3 months [   ] anorexia [  ]; fatigue Blue.Reese  ]; nausea [  ]; night sweats [  ]; fever [  ]; or chills [  ];  Dental: poor dentition[  ]; Last Dentist visit:   Eye : blurred vision [  ]; diplopia [   ]; vision changes [  ];  Amaurosis fugax[  ]; Resp: cough [  ];  wheezing[  ];  hemoptysis[  ]; shortness of breath[  ]; paroxysmal nocturnal dyspnea[  ]; dyspnea on exertion[  ]; or orthopnea[  ];  GI:  gallstones[  ], vomiting[  ];  dysphagia[  ]; melena[  ];  hematochezia [  ]; heartburn[  ];   Hx of  Colonoscopy[  ]; GU: kidney stones [  ]; hematuria[  ];   dysuria [  ];  nocturia[  ];  history of     obstruction [  ]; urinary frequency [  ]             Skin: rash, swelling[  ];, hair loss[  ];  peripheral edema[  ];  or itching[  ]; Musculosketetal: myalgias[  ];  joint swelling[  ];  joint erythema[  ];  joint pain[  ];  back pain[  ];  Heme/Lymph: bruising[  ];  bleeding[  ];  anemia[  ];  Neuro: TIA[  ];  headaches[  ];  stroke[  ];  vertigo[  ];  seizures[  ];   paresthesias[  ];   difficulty walking[ n ];  Psych:depression[  ]; anxiety[  ];  Endocrine: diabetes[  ];  thyroid dysfunction[  ];  Immunizations: Flu up to date [ ? ]; Pneumococcal up to date [  /];  Other:  Physical Exam: BP 181/90 mmHg  Pulse 80  Temp(Src) 97.7 F (36.5 C) (Oral)  Resp 18  Ht 6' 1.5" (1.867 m)  Wt 172 lb (78.019 kg)  BMI 22.38 kg/m2  SpO2 100%  PHYSICAL EXAMINATION: General appearance: alert, cooperative and appears stated age Head: Normocephalic, without obvious abnormality, atraumatic Neck: no adenopathy, no carotid bruit, no JVD, supple, symmetrical, trachea midline and thyroid not enlarged, symmetric, no tenderness/mass/nodules Lymph nodes: Cervical, supraclavicular, and axillary nodes normal. Resp: clear to auscultation bilaterally Back: symmetric, no curvature. ROM normal. No CVA tenderness. Cardio: regular rate and rhythm and systolic murmur: late systolic 3/6, blowing at 2nd right intercostal space GI: soft, non-tender; bowel sounds normal; no masses,  no organomegaly Genitalia: defer exam Extremities: extremities normal, atraumatic, no cyanosis or edema and Homans sign is negative, no sign of DVT Neurologic: Grossly normal Incision and left leg from previous femoral popliteal bypass  Diagnostic Studies & Laboratory data:     Recent Radiology Findings:   Dg Chest 2 View  11/28/2014   CLINICAL DATA:  Shortness of breath for 2 days.  EXAM: CHEST  2 VIEW  COMPARISON:  11/25/2014 and prior chest radiographs dating back to 01/16/2011  FINDINGS: The cardiomediastinal silhouette is unremarkable.  COPD/emphysema changes again identified.  There is no evidence of focal airspace disease, pulmonary edema, suspicious pulmonary nodule/mass, pleural effusion, or pneumothorax. No acute bony abnormalities are identified.  IMPRESSION: COPD/emphysema without evidence of acute cardiopulmonary disease.   Electronically Signed   By: Margarette Canada M.D.   On: 11/28/2014 08:32   Dg Chest 2  View  11/25/2014   CLINICAL DATA:  Aortic insufficiency, preoperative evaluation for AVR, history mitral insufficiency, coronary artery disease, hypertension, hyperlipidemia, former smoker, COPD, peripheral vascular disease  EXAM: CHEST  2 VIEW  COMPARISON:  11/10/2014  FINDINGS: Normal heart size and pulmonary vascularity.  Coronary arterial stent noted.  Tortuosity of thoracic aorta.  Hyperinflation and minimal peribronchial  thickening.  No pulmonary infiltrate, pleural effusion or pneumothorax.  Osseous demineralization.  IMPRESSION: Hyperinflation and minimal chronic peribronchial thickening could represent mild COPD changes.  No acute infiltrate.   Electronically Signed   By: Lavonia Dana M.D.   On: 11/25/2014 09:43   Dg Chest 2 View  11/10/2014   CLINICAL DATA:  Coronary artery disease.  EXAM: CHEST  2 VIEW  COMPARISON:  10/23/2014 and 729 1,016  FINDINGS: Heart size and pulmonary vascularity are normal and the lungs are clear. Pleural effusions present on the prior exam have resolved. No osseous abnormality.  IMPRESSION: No active cardiopulmonary disease.   Electronically Signed   By: Lorriane Shire M.D.   On: 11/10/2014 14:19   Ct Head Wo Contrast  11/28/2014   CLINICAL DATA:  Dizziness, slurred speech, confusion. Impending aortic valve replacement surgery.  EXAM: CT HEAD WITHOUT CONTRAST  TECHNIQUE: Contiguous axial images were obtained from the base of the skull through the vertex without intravenous contrast.  COMPARISON:  None.  FINDINGS: Mild atrophy with diffuse sulcal prominence. Scattered calcifications within the bilateral basal ganglia. Gray-white differentiation is otherwise well maintained without CT evidence of acute large territory infarct. Exuberant calcifications about the midline falx. No intraparenchymal or extra-axial mass or hemorrhage. Normal size and configuration of the ventricles and basilar cisterns. No midline shift. Limited visualization of the paranasal sinuses and mastoid air  cells is normal. No air-fluid levels. Regional soft tissues appear normal. No displaced calvarial fracture.  IMPRESSION: Mild atrophy without acute intracranial process.   Electronically Signed   By: Sandi Mariscal M.D.   On: 11/28/2014 08:49   Mr Brain Wo Contrast  11/28/2014   CLINICAL DATA:  64 year old female with altered speech and mental status since Friday. Intermittent dizziness. Planned aortic valve replacement tomorrow. Initial encounter.  EXAM: MRI HEAD WITHOUT CONTRAST  TECHNIQUE: Multiplanar, multiecho pulse sequences of the brain and surrounding structures were obtained without intravenous contrast.  COMPARISON:  Head CT without contrast 0837 hours today.  FINDINGS: Incidental bulky dural calcifications along the interhemispheric fissure. No restricted diffusion to suggest acute infarction. No midline shift, mass effect, evidence of mass lesion, ventriculomegaly, extra-axial collection or acute intracranial hemorrhage. Cervicomedullary junction and pituitary are within normal limits. Major intracranial vascular flow voids are within normal limits.  Scattered small bilateral cerebral white matter T2 and FLAIR hyperintense foci, mostly subcortical. The configuration is nonspecific an the extent is moderate for age. No cortical encephalomalacia or chronic cerebral blood products. Deep gray matter nuclei, brainstem, cerebellum, and visualized internal auditory structures appear normal.  Paranasal sinuses and mastoids are clear. Negative orbits soft tissues. Negative scalp soft tissues. Normal bone marrow signal. Negative visualized cervical spine.  IMPRESSION: 1.  No acute intracranial abnormality. 2. Moderate for age nonspecific cerebral white matter signal changes, most commonly due to chronic small vessel disease.   Electronically Signed   By: Genevie Ann M.D.   On: 11/28/2014 09:32    CT of Chest 10/27/2014 CLINICAL DATA: Re-evaluate right upper lobe pulmonary nodule, prior to possible valve surgery.  Initial encounter.  EXAM: CT CHEST WITHOUT CONTRAST  TECHNIQUE: Multidetector CT imaging of the chest was performed following the standard protocol without IV contrast.  COMPARISON: CT of the chest performed 08/25/2014  FINDINGS: The small slightly spiculated nodule at the right lung apex is again seen; there is a small associated cystic component which appears to have increased mildly in size, while the solid component has decreased slightly in size, measuring approximately 5 mm.  Given the slight apparent interval decrease in size of the solid component from the recent prior study, this is thought more likely to be postinfectious in nature, though malignancy cannot be entirely excluded.  No additional pulmonary nodules are seen. The lungs are otherwise clear, aside from mild scarring or atelectasis at the left lung base. No pleural effusion or pneumothorax is seen.  Scattered coronary artery calcifications are noted. The mediastinum is otherwise unremarkable. No mediastinal lymphadenopathy is seen. No pericardial effusion is identified. The great vessels are grossly unremarkable, aside from scattered calcification. Mild calcification is noted along the thoracic aorta. The visualized portions of the thyroid gland are unremarkable. No axillary lymphadenopathy is appreciated.  No acute osseous abnormalities are identified.  IMPRESSION: 1. Small slightly spiculated nodule at the right lung apex is again seen. It has a small cystic component which appears to have increased mildly in size, while the solid component has decreased slightly in size, measuring approximately 5 mm. Given the slight apparent interval decrease in size of the solid component from the recent prior CT, this is thought more likely to be postinfectious in nature, though malignancy cannot be entirely excluded. As this remains too small to further characterize, would recommend strict follow-up CT of  the chest in 3-6 months, to ensure stability. 2. Mild scarring or atelectasis at the left lung base. 3. Scattered coronary artery calcifications seen.  I have independently reviewed the above radiologic studies TEE: Study Conclusions  - Left ventricle: Systolic function was normal. The estimated ejection fraction was in the range of 55% to 60%. - Aortic valve: There was moderate regurgitation. - Mitral valve: There was severe regurgitation. - Left atrium: No evidence of thrombus in the atrial cavity or appendage.  Diagnostic transesophageal echocardiography. 2D and color Doppler. Birthdate: Patient birthdate: 15-Mar-1951. Age: Patient is 64 yr old. Sex: Gender: female.  BMI: 22.4 kg/m^2. Blood pressure: 159/66 Patient status: Inpatient. Study date: Study date: 10/24/2014. Study time: 10:03 AM. Location: Endoscopy.  -------------------------------------------------------------------  ------------------------------------------------------------------- Left ventricle: Systolic function was normal. The estimated ejection fraction was in the range of 55% to 60%.  ------------------------------------------------------------------- Aortic valve:  The valve appears to be grossly normal.  Doppler: There was moderate regurgitation.  ------------------------------------------------------------------- Aorta: There is moderate atheroma in the thoracic aorta. The aorta was moderately calcified.  ------------------------------------------------------------------- Mitral valve:  The valve appears to be grossly normal.  Doppler: There was severe regurgitation.  ------------------------------------------------------------------- Left atrium: The atrium was normal in size. No evidence of thrombus in the atrial cavity or appendage.  ------------------------------------------------------------------- Post procedure conclusions Ascending Aorta:  - There is  moderate atheroma in the thoracic aorta. The aorta was moderately calcified.   Cardiac Cath: Dominance: Right   Left Main  The vessel is angiographically normal.     Left Anterior Descending   . Ost LAD lesion, 70% stenosed. discrete .   Marland Kitchen Mid LAD to Dist LAD lesion, 70% stenosed. located at the bend . The lesion was previously treated with a drug-eluting stent greater than two years ago.   . First Diagonal Branch   There is mild in the vessel.   . Second Diagonal Branch   . 2nd Diag lesion, 30% stenosed.   . Third Diagonal Branch   The vessel is small in size.     Left Circumflex  There is mild the vessel.   Colon Flattery Cx lesion, 30% stenosed.   . Mid Cx lesion, 30% stenosed.   . First Obtuse Marginal Branch   The vessel  is small in size.   Marland Kitchen Second Obtuse Marginal Branch   The vessel is small in size.   . Third Obtuse Marginal Branch   There is mild in the vessel.     Right Coronary Artery   . Mid RCA lesion, 20% stenosed.   . Right Posterior Descending Artery   There is mild in the vessel.   . Right Posterior Atrioventricular Branch   The vessel exhibits minimal luminal irregularities.   . First Right Posterolateral   The vessel exhibits minimal luminal irregularities.   . Second Right Posterolateral   The vessel exhibits minimal luminal irregularities.       Right Heart Pressures LV EDP is normal. Initially filling pressures were very low due to aggressive diuresis. In the lab the patient received a 250 cc bolus of IV fluid as well as approximately 80 cc of contrast. With fluid challenge PCWP increased from 5/4 to 17/15 mm Hg and PA pressure increased from 22/6 mm Hg to 31/8 mm Hg. Right heart pressures are normal.     PFT's 10/2014 FEV1 2.18  77%  DLCO 11.38 32% The diffusion defect and reduced lung volumes suggest an early parenchymal process. Minimal airway obstruction is present. In view of the severity of the diffusion defect, studies with exercise would  be helpful to evaluate the presence of hypoxemia. Pulmonary Function Diagnosis: Minimal Obstructive Airways Disease Insignificant response to bronchodilator Mild Restriction -Parenchymal Severe Diffusion Defect Recent Lab Findings: Lab Results  Component Value Date   WBC 11.2* 11/28/2014   HGB 12.8 11/28/2014   HCT 39.1 11/28/2014   PLT 122* 11/28/2014   GLUCOSE 97 11/30/2014   CHOL 181 08/24/2014   TRIG 88 08/24/2014   HDL 33* 08/24/2014   LDLDIRECT 185* 09/09/2011   LDLCALC 130* 08/24/2014   ALT 12* 11/28/2014   AST 16 11/28/2014   NA 137 11/30/2014   K 3.7 11/30/2014   CL 101 11/30/2014   CREATININE 1.09* 11/30/2014   BUN 15 11/30/2014   CO2 26 11/30/2014   TSH 1.756 11/28/2014   INR 1.15 12/02/2014   HGBA1C 6.3* 12/02/2014      Assessment / Plan:   Severe mitral insufficiency with scarred mitral valve suggestive of rheumatic fever, at least moderate aortic insufficiency and recurrent coronary artery disease. I recommended to the patient that we proceed with aortic and mitral valve replacement and coronary artery bypass grafting. The risks and options of surgery have been discussed with the patient on several visits.  We discussed use of mechanical versus tissue valves and she prefers tissue valve.   The goals risks and alternatives of the planned surgical procedure CABG,AVR, MVR have been discussed with the patient in detail. The risks of the procedure including death, infection, stroke, myocardial infarction, bleeding, blood transfusion  Poss permanent pacemakerhave all been discussed specifically.  I have quoted [name] a 6 % of perioperative mortality and a complication rate as high as 40 %. The patient's questions have been answered.Leah [name] [place] is willing  to proceed with the planned procedure.    [name] MD      [address] [place] [address] Office [phone]   Beeper [phone]  12/05/2014 7:14  AM

## 2014-12-06 ENCOUNTER — Inpatient Hospital Stay: Payer: No Typology Code available for payment source | Admitting: Internal Medicine

## 2014-12-06 ENCOUNTER — Other Ambulatory Visit: Payer: Self-pay

## 2014-12-06 ENCOUNTER — Encounter (HOSPITAL_COMMUNITY): Payer: Self-pay | Admitting: Cardiothoracic Surgery

## 2014-12-06 ENCOUNTER — Inpatient Hospital Stay (HOSPITAL_COMMUNITY): Payer: Medicaid Other

## 2014-12-06 LAB — POCT I-STAT 3, ART BLOOD GAS (G3+)
Acid-base deficit: 2 mmol/L (ref 0.0–2.0)
Acid-base deficit: 2 mmol/L (ref 0.0–2.0)
Bicarbonate: 23.4 mEq/L (ref 20.0–24.0)
Bicarbonate: 24.3 mEq/L — ABNORMAL HIGH (ref 20.0–24.0)
O2 Saturation: 96 %
O2 Saturation: 99 %
Patient temperature: 36.5
Patient temperature: 36.8
TCO2: 25 mmol/L (ref 0–100)
TCO2: 26 mmol/L (ref 0–100)
pCO2 arterial: 42.8 mmHg (ref 35.0–45.0)
pCO2 arterial: 45.2 mmHg — ABNORMAL HIGH (ref 35.0–45.0)
pH, Arterial: 7.338 — ABNORMAL LOW (ref 7.350–7.450)
pH, Arterial: 7.344 — ABNORMAL LOW (ref 7.350–7.450)
pO2, Arterial: 146 mmHg — ABNORMAL HIGH (ref 80.0–100.0)
pO2, Arterial: 86 mmHg (ref 80.0–100.0)

## 2014-12-06 LAB — GLUCOSE, CAPILLARY
Glucose-Capillary: 103 mg/dL — ABNORMAL HIGH (ref 65–99)
Glucose-Capillary: 106 mg/dL — ABNORMAL HIGH (ref 65–99)
Glucose-Capillary: 108 mg/dL — ABNORMAL HIGH (ref 65–99)
Glucose-Capillary: 110 mg/dL — ABNORMAL HIGH (ref 65–99)
Glucose-Capillary: 125 mg/dL — ABNORMAL HIGH (ref 65–99)
Glucose-Capillary: 130 mg/dL — ABNORMAL HIGH (ref 65–99)
Glucose-Capillary: 132 mg/dL — ABNORMAL HIGH (ref 65–99)
Glucose-Capillary: 93 mg/dL (ref 65–99)
Glucose-Capillary: 93 mg/dL (ref 65–99)
Glucose-Capillary: 94 mg/dL (ref 65–99)
Glucose-Capillary: 96 mg/dL (ref 65–99)
Glucose-Capillary: 98 mg/dL (ref 65–99)

## 2014-12-06 LAB — POCT I-STAT, CHEM 8
BUN: 11 mg/dL (ref 6–20)
Calcium, Ion: 1.27 mmol/L (ref 1.13–1.30)
Chloride: 100 mmol/L — ABNORMAL LOW (ref 101–111)
Creatinine, Ser: 0.9 mg/dL (ref 0.44–1.00)
Glucose, Bld: 129 mg/dL — ABNORMAL HIGH (ref 65–99)
HCT: 23 % — ABNORMAL LOW (ref 36.0–46.0)
Hemoglobin: 7.8 g/dL — ABNORMAL LOW (ref 12.0–15.0)
Potassium: 4.1 mmol/L (ref 3.5–5.1)
Sodium: 133 mmol/L — ABNORMAL LOW (ref 135–145)
TCO2: 23 mmol/L (ref 0–100)

## 2014-12-06 LAB — PREPARE PLATELET PHERESIS: Unit division: 0

## 2014-12-06 LAB — CBC
HEMATOCRIT: 24.4 % — AB (ref 36.0–46.0)
HEMATOCRIT: 23.2 % — AB (ref 36.0–46.0)
HEMOGLOBIN: 8.1 g/dL — AB (ref 12.0–15.0)
HEMOGLOBIN: 7.5 g/dL — AB (ref 12.0–15.0)
MCH: 26 pg (ref 26.0–34.0)
MCH: 25.3 pg — ABNORMAL LOW (ref 26.0–34.0)
MCHC: 33.2 g/dL (ref 30.0–36.0)
MCHC: 32.3 g/dL (ref 30.0–36.0)
MCV: 78.2 fL (ref 78.0–100.0)
MCV: 78.1 fL (ref 78.0–100.0)
Platelets: 95 10*3/uL — ABNORMAL LOW (ref 150–400)
Platelets: 80 10*3/uL — ABNORMAL LOW (ref 150–400)
RBC: 3.12 MIL/uL — ABNORMAL LOW (ref 3.87–5.11)
RBC: 2.97 MIL/uL — ABNORMAL LOW (ref 3.87–5.11)
RDW: 15.3 % (ref 11.5–15.5)
RDW: 15.4 % (ref 11.5–15.5)
WBC: 14.3 10*3/uL — ABNORMAL HIGH (ref 4.0–10.5)
WBC: 14.8 10*3/uL — ABNORMAL HIGH (ref 4.0–10.5)

## 2014-12-06 LAB — BASIC METABOLIC PANEL
Anion gap: 7 (ref 5–15)
BUN: 7 mg/dL (ref 6–20)
CHLORIDE: 102 mmol/L (ref 101–111)
CO2: 25 mmol/L (ref 22–32)
CREATININE: 0.74 mg/dL (ref 0.44–1.00)
Calcium: 9.1 mg/dL (ref 8.9–10.3)
GFR calc non Af Amer: >60 mL/min (ref >60)
GLUCOSE: 124 mg/dL — AB (ref 65–99)
Potassium: 3.9 mmol/L (ref 3.5–5.1)
Sodium: 134 mmol/L — ABNORMAL LOW (ref 135–145)

## 2014-12-06 LAB — CREATININE, SERUM
Creatinine, Ser: 0.98 mg/dL (ref 0.44–1.00)
GFR calc Af Amer: >60 mL/min (ref >60)

## 2014-12-06 LAB — PREPARE RBC (CROSSMATCH)

## 2014-12-06 LAB — MAGNESIUM
MAGNESIUM: 1.9 mg/dL (ref 1.7–2.4)
Magnesium: 1.9 mg/dL (ref 1.7–2.4)

## 2014-12-06 MED ORDER — CETYLPYRIDINIUM CHLORIDE 0.05 % MT LIQD
7.0000 mL | Freq: Two times a day (BID) | OROMUCOSAL | Status: DC
  Administered 2014-12-06 – 2014-12-10 (×9): 7 mL via OROMUCOSAL

## 2014-12-06 MED ORDER — SODIUM CHLORIDE 0.9 % IV SOLN: Freq: Once | INTRAVENOUS | Status: DC

## 2014-12-06 MED ORDER — INSULIN ASPART 100 UNIT/ML ~~LOC~~ SOLN: 0.0000 [IU] | SUBCUTANEOUS | Status: DC

## 2014-12-06 MED FILL — Potassium Chloride Inj 2 mEq/ML: INTRAVENOUS | Qty: 40 | Status: AC

## 2014-12-06 MED FILL — Heparin Sodium (Porcine) Inj 1000 Unit/ML: INTRAMUSCULAR | Qty: 30 | Status: AC

## 2014-12-06 MED FILL — Magnesium Sulfate Inj 50%: INTRAMUSCULAR | Qty: 10 | Status: AC

## 2014-12-06 NOTE — Procedures (Signed)
Extubation Procedure Note  Patient Details:   Name: Leah [name] DOB: May 13, 1950 MRN: [id number]   Airway Documentation:  Airway 8 mm (Active)  Secured at (cm) 20 cm 12/05/2014 11:22 PM  Measured From Lips 12/05/2014 11:22 PM  Secured Location Right 12/05/2014 11:22 PM  Secured By Pink Tape 12/05/2014 11:22 PM  Site Condition Dry 12/05/2014 11:22 PM    Evaluation  O2 sats: stable throughout Complications: No apparent complications Patient did tolerate procedure well. Bilateral Breath Sounds: Clear, Diminished   Yes  Patient was extubated to 3 LPM nasal cannula. Patient had positive cuff leak. Appropriate NIF/VC. Patient was able to speak name, no stridor noted. Clear; diminished BBS. Patient had a good productive cough when extubated. RT will continue to monitor.   Murphy Duzan M 12/06/2014, 12:23 AM

## 2014-12-06 NOTE — Op Note (Signed)
NAMESHARDEE, DIEU            ACCOUNT NO.:  [id number]  MEDICAL RECORD NO.:  [id number]  LOCATION:  2S06C                        FACILITY:  [facility]  PHYSICIAN:  Ala Dach, M.D.DATE OF BIRTH:  01-Jun-1950  DATE OF PROCEDURE:  12/05/2014 DATE OF DISCHARGE:                              OPERATIVE REPORT   PROCEDURE:  Intraoperative transesophageal echocardiography.  INDICATION FOR PROCEDURE:  Ms. Leah [name] is a patient of Dr. Servando Snare, who presents today for possible aortic valve replacement, possible mitral valve replacement, and coronary artery bypass grafting.  She is brought to the holding area on the morning of surgery, where under local anesthesia with sedation pulmonary artery and radial arterial lines are placed.  PRECARDIOPULMONARY BYPASS TEE EXAMINATION:  Left ventricle and left ventricular chamber was seen in the short axis view initially.  There is mild left ventricular hypertrophy.  There is a generous left ventricular chamber size associated with a small pericardial effusion seen in the short axis view.  There is overall good excellent contractile pattern appreciated, all segmental wall areas are thickening.  Pullback to the long axis view again shows good posterior to anterior contractile pattern of the left ventricular chamber.  The mitral valve seen initially in the four-chamber view.  The leaflets themselves appear thick and degenerative in appearance.  There is significant abnormal motion of the anterior and posterior leaflets.  It does appear to be somewhat of rheumatic degeneration of these valves.  There is restriction of motion, especially with the leaflet tips.  On color Doppler, there is moderate-to-severe mitral regurgitant flow across the valve into the left atrial chamber.  Multiple small jets are visualized across this abnormal valve.  Color Doppler indicates the severity is moderate-to-severe.  Left Atrium:  Left atrial chamber is only mildly  enlarged.  No masses are appreciated.  The left atrial appendage is interrogated and is intact.  The interatrial septum is interrogated and is intact.  Aortic Valve:  Further pullback to the short-axis view, the aortic valve is visualized and it is trileaflet.  It is mildly degenerative in appearance.  It also has some rheumatica  characteristics associated with this.  However all leaflets open well during systolic contraction and during diastole, we note a very small central jet of aortic insufficiency.  This was looked at in multiple views.  On a view of the left ventricular outflow tract across the long-axis view of the aortic valve  we now see that the jet of insufficiency is small.  It measures vena contracta of about 0.3 now.  The associated width of aortic insufficiency jet would suggest it is only at the upper limits of mild aortic insufficiency.  Right Ventricle:  The right ventricular chamber is seen as highly contractile.  Generous in its chamber size.  The anterior wall is quite contractile and of normal contractility and pattern.  Tricuspid Valve:  Normal, thin, compliant, mobile tricuspid valve is appreciated.  Right Atrium:  Normal right atrial chamber is interrogated.  Pulmonary artery catheter is noted within.  The patient is placed on cardiopulmonary bypass.  Hypothermia is begun. The procedure is carried out.  Coronary artery bypass grafting is followed by  mitral valve degenerative excision with replacement  of a pericardial tissue valve in the mitral position.  POSTCARDIOPULMONARY BYPASS EXAMINATION (LIMITED EXAM):  In the short axis view, left ventricular chamber contractility remains good.  Further pullback to the 4-chamber view now reveals the pericardial tissue valve in the mitral position and this seated well.  Leaflets are well visualized.  All areas appear satisfactory.  On color Doppler, there is no regurgitant flow.  There is no perivalvular leak or  other.  This appears to be a completely satisfactory placement of the mitral valve apparatus.  The rest of the cardiac examination was as previously described, and the patient has returned to the cardiac intensive care unit in stable condition.          ______________________________ Ala Dach, M.D.     JTM/MEDQ  D:  12/05/2014  T:  12/06/2014  Job:  [id number]

## 2014-12-06 NOTE — Progress Notes (Signed)
[facility] BRIEF SICU PROGRESS NOTE  1 Day Post-Op  S/P Procedure(s) (LRB): MITRAL VALVE (MV) REPLACEMENT (N/A) CORONARY ARTERY BYPASS GRAFTING (CABG) (N/A) TRANSESOPHAGEAL ECHOCARDIOGRAM (TEE) (N/A)   AAI paced w/ stable BP although still on Neo and Dopamine drips O2 sats 95% on 3 L/min UOP 30 mL/hr Hgb down to 7.5  Plan: Will transfuse 1 unit PRBC's  Rexene Alberts 12/06/2014 6:16 PM

## 2014-12-06 NOTE — Progress Notes (Addendum)
[facility] DAILY ICU PROGRESS NOTE                   [address]            [place] [address]          [phone]   1 Day Post-Op Procedure(s) (LRB): MITRAL VALVE (MV) REPLACEMENT (N/A) CORONARY ARTERY BYPASS GRAFTING (CABG) (N/A) TRANSESOPHAGEAL ECHOCARDIOGRAM (TEE) (N/A)  Total Length of Stay:  LOS: 1 day   Subjective: Awake and alert, sore but no other complaints.   Objective: Vital signs in last 24 hours: Temp:  [96.1 F (35.6 C)-99.1 F (37.3 C)] 98.8 F (37.1 C) (09/13 0745) Pulse Rate:  [36-91] 76 (09/13 0745) Cardiac Rhythm:  [-] Atrial paced (09/12 2000) Resp:  [10-26] 18 (09/13 0745) BP: (87-126)/(48-66) 124/55 mmHg (09/13 0700) SpO2:  [95 %-100 %] 99 % (09/13 0745) Arterial Line BP: (85-142)/(42-64) 127/52 mmHg (09/13 0745) FiO2 (%):  [40 %-50 %] 40 % (09/12 2322) Weight:  [162 lb 7.7 oz (73.7 kg)] 162 lb 7.7 oz (73.7 kg) (09/13 0530)  Filed Weights   12/05/14 0557 12/06/14 0530  Weight: 172 lb (78.019 kg) 162 lb 7.7 oz (73.7 kg)  PRE-OPERATIVE WEIGHT: 78 kg  Weight change: -9 lb 8.3 oz (-4.319 kg)   Hemodynamic parameters for last 24 hours: PAP: (26-50)/(16-39) 37/29 mmHg CO:  [2.8 L/min-4.8 L/min] 4.8 L/min CI:  [1.4 L/min/m2-2.4 L/min/m2] 2.4 L/min/m2  Intake/Output from previous day: 09/12 0701 - 09/13 0700 In: 5936.1 [P.O.:120; I.V.:4109.1; Blood:627; NG/GT:30; IV Piggyback:1050] Out: 4695 [Urine:5720; Emesis/NG output:70; Blood:1000; Chest Tube:390]  CBGs 93-110-103-130-136-132-108-124    Current Meds: Scheduled Meds: . acetaminophen  1,000 mg Oral 4 times per day   Or  . acetaminophen (TYLENOL) oral liquid 160 mg/5 mL  1,000 mg Per Tube 4 times per day  . antiseptic oral rinse  7 mL Mouth Rinse BID  . aspirin EC  325 mg Oral Daily   Or  . aspirin  324 mg Per Tube Daily  . atorvastatin  80 mg Oral Daily  . bisacodyl  10 mg Oral Daily   Or  . bisacodyl  10 mg Rectal Daily  . docusate sodium  200 mg Oral Daily  .  insulin aspart  0-24 Units Subcutaneous 6 times per day  . levofloxacin (LEVAQUIN) IV  750 mg Intravenous Q24H  . metoprolol tartrate  12.5 mg Oral BID   Or  . metoprolol tartrate  12.5 mg Per Tube BID  . [START ON 12/07/2014] pantoprazole  40 mg Oral Daily  . sodium chloride  3 mL Intravenous Q12H   Continuous Infusions: . sodium chloride 20 mL/hr at 12/06/14 0700  . sodium chloride    . sodium chloride 20 mL/hr at 12/06/14 0700  . dexmedetomidine Stopped (12/05/14 1748)  . DOPamine 3 mcg/kg/min (12/06/14 0700)  . lactated ringers 20 mL/hr at 12/05/14 2200  . lactated ringers 20 mL/hr at 12/06/14 0700  . nitroGLYCERIN    . phenylephrine (NEO-SYNEPHRINE) Adult infusion 45 mcg/min (12/06/14 0754)   PRN Meds:.sodium chloride, lactated ringers, metoprolol, midazolam, morphine injection, ondansetron (ZOFRAN) IV, oxyCODONE, sodium chloride, traMADol   Physical Exam: General appearance: alert, cooperative and no distress Heart: regular rate and rhythm and trigeminy Lungs: diminished breath sounds bibasilar Extremities: Minimal LE edema Wound: Dressed and dry    Lab Results: CBC: Recent Labs  12/05/14 2020 12/06/14 0440  WBC 15.5* 14.3*  HGB 9.0* 8.1*  HCT 27.7* 24.4*  PLT 102* 95*  BMET:  Recent Labs  12/05/14 2019 12/05/14 2020 12/06/14 0440  NA 137  --  134*  K 4.0  --  3.9  CL 104  --  102  CO2  --   --  25  GLUCOSE 136*  --  124*  BUN 8  --  7  CREATININE 0.80 0.79 0.74  CALCIUM  --   --  9.1    PT/INR:  Recent Labs  12/05/14 1456  LABPROT 21.8*  INR 1.91*   Radiology: Dg Chest Port 1 View  12/06/2014   CLINICAL DATA:  Post CABG and mitral valve replacement  EXAM: PORTABLE CHEST - 1 VIEW  COMPARISON:  Portable chest x-ray of 12/05/2014  FINDINGS: The endotracheal tube has been removed. There is little change to perhaps minimal improvement in aeration. A left chest tube remains and no pneumothorax is seen. The Swan-Ganz catheter tip is in the right main  pulmonary artery region. Cardiomegaly is stable. Opacity remains at the left lung base consistent with atelectasis, effusion, or pneumonia.  IMPRESSION: 1. Endotracheal tube removed. Little change to slightly improved aeration. 2. Persistent opacity medially at the left lung base. Consider atelectasis, effusion, or possibly pneumonia.   Electronically Signed   By: Ivar Drape M.D.   On: 12/06/2014 07:59   Dg Chest Port 1 View  12/05/2014   CLINICAL DATA:  Status post mitral valve replacement and CABG.  EXAM: PORTABLE CHEST - 1 VIEW  COMPARISON:  11/28/2014  FINDINGS: Mitral valve replacement and median sternotomy wires noted.  Endotracheal tube is present with tip 7 cm above carina, right IJ Swan-Ganz catheter with tip overlying the main pulmonary artery, mediastinal and left thoracostomy tubes, and NG tube entering the stomach with tip off the field of view.  Minimal left basilar atelectasis noted.  There is no evidence of pneumothorax or pulmonary edema.  IMPRESSION: Cardiac surgical changes with support apparatus as described. No evidence of pneumothorax or pulmonary edema.  Minimal left basilar atelectasis.   Electronically Signed   By: Margarette Canada M.D.   On: 12/05/2014 15:02     Assessment/Plan: S/P Procedure(s) (LRB): MITRAL VALVE (MV) REPLACEMENT (N/A) CORONARY ARTERY BYPASS GRAFTING (CABG) (N/A) TRANSESOPHAGEAL ECHOCARDIOGRAM (TEE) (N/A)  CV- Gtts: Dopamine, Neo. BPs stable, SR with PVCs/trigeminy. Wean and d/c gtts as able.  Pulm- start IS/pulm toilet.  Thrombocytopenia- watch platelet count.  Expected postop blood loss anemia- [name]/[name] down slightly this am, will monitor.  Mobilize, wean and d/c lines and tubes as able, routine POD #1 progression.   LeahGINA [name] 12/06/2014 8:02 AM   Avoid heparin with low plts, 65,000 in or now up to 90,000 I have seen and examined Leah [name] and agree with the above assessment  and plan.  Leah Isaac MD Beeper [phone] Office  [phone] 12/06/2014 10:37 AM

## 2014-12-07 ENCOUNTER — Inpatient Hospital Stay (HOSPITAL_COMMUNITY): Payer: Medicaid Other

## 2014-12-07 LAB — GLUCOSE, CAPILLARY
Glucose-Capillary: 115 mg/dL — ABNORMAL HIGH (ref 65–99)
Glucose-Capillary: 72 mg/dL (ref 65–99)
Glucose-Capillary: 78 mg/dL (ref 65–99)
Glucose-Capillary: 85 mg/dL (ref 65–99)
Glucose-Capillary: 89 mg/dL (ref 65–99)
Glucose-Capillary: 96 mg/dL (ref 65–99)

## 2014-12-07 LAB — CBC
HCT: 26.3 % — ABNORMAL LOW (ref 36.0–46.0)
Hemoglobin: 8.6 g/dL — ABNORMAL LOW (ref 12.0–15.0)
MCH: 25.9 pg — ABNORMAL LOW (ref 26.0–34.0)
MCHC: 32.7 g/dL (ref 30.0–36.0)
MCV: 79.2 fL (ref 78.0–100.0)
Platelets: 58 10*3/uL — ABNORMAL LOW (ref 150–400)
RBC: 3.32 MIL/uL — ABNORMAL LOW (ref 3.87–5.11)
RDW: 15.3 % (ref 11.5–15.5)
WBC: 13.2 10*3/uL — ABNORMAL HIGH (ref 4.0–10.5)

## 2014-12-07 LAB — TYPE AND SCREEN
ABO/RH(D): O POS
Antibody Screen: NEGATIVE
Unit division: 0

## 2014-12-07 LAB — BASIC METABOLIC PANEL
Anion gap: 5 (ref 5–15)
BUN: 9 mg/dL (ref 6–20)
CO2: 27 mmol/L (ref 22–32)
Calcium: 9.1 mg/dL (ref 8.9–10.3)
Chloride: 103 mmol/L (ref 101–111)
Creatinine, Ser: 0.81 mg/dL (ref 0.44–1.00)
GFR calc Af Amer: >60 mL/min (ref >60)
GFR calc non Af Amer: >60 mL/min (ref >60)
Glucose, Bld: 101 mg/dL — ABNORMAL HIGH (ref 65–99)
Potassium: 3.8 mmol/L (ref 3.5–5.1)
Sodium: 135 mmol/L (ref 135–145)

## 2014-12-07 LAB — PROTIME-INR
INR: 1.58 — ABNORMAL HIGH (ref 0.00–1.49)
Prothrombin Time: 18.9 seconds — ABNORMAL HIGH (ref 11.6–15.2)

## 2014-12-07 MED ORDER — WARFARIN SODIUM 2.5 MG PO TABS
2.5000 mg | ORAL_TABLET | Freq: Every day | ORAL | Status: DC
  Administered 2014-12-07 – 2014-12-09 (×3): 2.5 mg via ORAL
  Filled 2014-12-07 (×3): qty 1

## 2014-12-07 MED ORDER — WARFARIN - PHYSICIAN DOSING INPATIENT
Freq: Every day | Status: DC
  Administered 2014-12-07 – 2014-12-09 (×2)

## 2014-12-07 MED ORDER — ASPIRIN 81 MG PO CHEW: 81.0000 mg | CHEWABLE_TABLET | Freq: Every day | ORAL | Status: DC

## 2014-12-07 MED ORDER — ASPIRIN EC 325 MG PO TBEC
325.0000 mg | DELAYED_RELEASE_TABLET | Freq: Every day | ORAL | Status: DC
  Administered 2014-12-07 – 2014-12-08 (×2): 325 mg via ORAL
  Filled 2014-12-07 (×2): qty 1

## 2014-12-07 NOTE — Op Note (Signed)
[name], [name]            ACCOUNT NO.:  [id number]  MEDICAL RECORD NO.:  [id number]  LOCATION:  2S06C                        FACILITY:  [facility]  PHYSICIAN:  Lanelle Bal, MD    DATE OF BIRTH:  1950-05-27  DATE OF PROCEDURE:  12/05/2014 DATE OF DISCHARGE:                              OPERATIVE REPORT   PREOPERATIVE DIAGNOSES: 1. Severe mitral insufficiency. 2. Aortic insufficiency. 3. Coronary occlusive disease.  POSTOPERATIVE DIAGNOSES: 1. Severe mitral insufficiency. 2. Aortic insufficiency. 3. Coronary occlusive disease. 4. Aortic insufficiency, mild.  PROCEDURE PERFORMED:  Mitral valve replacement with [facility] pericardial tissue valve, model 7300TFX 29 mm, serial #[id number] and coronary artery bypass grafting x1 with the left internal mammary to the left anterior descending coronary artery.  Closure of left atrial appendage.  SURGEON:  Lanelle Bal, MD.  FIRST ASSISTANT:  Suzzanne Cloud, P.A.  BRIEF HISTORY:  The patient is a 64 year old female with known coronary occlusive disease, having presented in June with heart failure symptoms and positive troponin.  The patient then underwent cardiac catheterization and on echocardiogram was found to have severe mitral regurgitation and LAD disease, but was initially treated medically.  In late July, she was readmitted again with heart failure symptoms and was diuresed.  Repeat cardiac catheterization was performed.  At this time, it was noted that the proximal LAD had much more significant disease than originally noted.  A surgical consultation was obtained at that time, and the patient has been on Plavix and continued to smoke.  She was stabilized medically and underwent intensive anti-smoking program and quit smoking.  She was seen back in the office and plans for surgery for mitral valve replacement, possible aortic valve replacement, coronary artery bypass grafting were made.  The patient was  subsequently readmitted after being started on Wellbutrin with mild confusion and neurologic workup was performed without revealing any etiology and the patient stabilized.  She returns today for planned surgery.  Risks and options were discussed with the patient in detail.  Preoperative echocardiograms indicated that the most likely etiology of her valvular disease was rheumatic with severely restricted leaflets of her mitral valve and marked thickening of the anterior leaflet.  It was not felt that repair would result in a good option.  Use of mechanical versus tissue valves were discussed with the patient and tissue valves were decided upon.  DESCRIPTION OF PROCEDURE:  The patient underwent general endotracheal anesthesia without incident.  Skin of chest and legs were prepped with Betadine and draped in usual sterile manner.  A TEE probe was placed and transesophageal echo was performed by Dr. Orene Desanctis.  With this, the patient has indicated preop had severe mitral regurgitation.  However, the degree of aortic insufficiency was measured by the width of the jet and size of the outflow track was estimated as mild, not moderate.  Echo was also reviewed by Dr. Acie Fredrickson from Cardiology and it was ultimately decided to proceed with mitral valve replacement and coronary bypass and not replace the aortic valve.  The skin of the chest and legs were prepped with Betadine and draped in usual sterile manner.  Appropriate time-out was performed.  Median sternotomy was performed.  Left internal mammary artery  was dissected down as pedicle graft.  The distal artery was divided and had good free flow.  Pericardium was opened.  Overall, ventricular function appeared preserved.  The right side of the heart was not particularly enlarged.  The patient was systemically heparinized.  Ascending aorta was cannulated.  The caval tapes were placed around the inferior and superior vena cava.  A right angled  metal- tipped venous drainage catheters were placed in the superior vena cava and inferior vena cava.  A retrograde cardioplegic catheter was placed in position with echo guidance.  An aortic root vent cardioplegia was placed in the ascending aorta.  The left internal mammary artery had been dissected down as a pedicle graft and distal artery demonstrated good flow of the vessels.  The patient was placed on cardiopulmonary bypass, 2.4 L/min/sq m.  Sites of anastomosis in the LAD were dissected out in the distal third of the LAD.  The mid part of the LAD was intramyocardial.  The patient's body temperature was cooled to 32 degrees.  Aortic crossclamp was applied and 500 mL of antegrade cardioplegia was administered.  In addition, retrograde cardioplegia was administered.  We turned our attention first to the coronary anastomosis.  The LAD was opened and it was very thin walled, relatively small distal LAD, but did admit 1 mm probe distally.  Using a running 8- 0 Prolene, the left internal mammary artery was anastomosed to left anterior descending coronary artery.  There was appropriate rise in myocardial septal temperature with removal of the mammary bulldog.  The bulldog was placed back on the mammary.  Additional cold blood cardioplegia was administered, and we turned our attention to the mitral valve.  An incision was made in the left atrium and with the Cosgrove retractor, we had excellent visualization of the mitral valve.  There was no clot in the atrium, and the patient had been in sinus rhythm preoperatively.  The mitral valve was carefully examined and as indicated on TE, the base was relatively free of disease, but the distal 2/3rd of the leaflet were significantly thickened and calcified.  The posterior and anterior leaflets were fused and significantly shortened. With a running 4-0 Prolene suture, the left atrial appendage was sutured and closed in a double layer.  Anterior  leaf of the mitral valve was excised.  Portions of the secondary attachments of the posterior leaflet were left intact.  The valve was sized for a 29 pericardial tissue valve.  #2 Tycron pledgeted sutures were placed circumferentially with pledgets on the atrial surface and used to secure the valve in place, which seated well.  With the valve in place, a small catheter was placed across the valve to make it incompetent, and we closed the left atrium with a running horizontal mattress 4-0 Prolene and a second layer of simple running Prolene 4-0.  As we closed, the heart was allowed to passively fill and de-air.  The bulldog was removed from the mammary artery with rise in myocardial septal temperature.  Atriotomy was closed.  The aortic crossclamp was removed with total crossclamp time of 112 minutes.  The patient did require electrical cardioversion to return to a sinus rhythm.  Atrial and ventricular pacing wires were applied. Low-dose dopamine was started.  The heart was allowed to fill and TE showed good function of the mitral valve without any perivalvular leak and aortic insufficiency was no more than mild.  The patient was rewarmed to 37 degrees.  Atrial and ventricular pacing wires were  applied.  The patient was ventilated and weaned from cardiopulmonary bypass without difficulty.  She remained hemodynamically stable.  Total pump time was 143 minutes.  While on cardiopulmonary bypass, the platelet count was 64,000.  After separation from bypass, a unit of platelet was administered.  The patient remained hemodynamically stable, and decannulated in usual fashion.  Protamine sulfate was administered with operative field hemostatic.  A left pleural tube and 2 Blake mediastinal drains were left in place.  Pericardium was loosely reapproximated.  Sternum was closed with #6 stainless steel wire. Fascia closed with interrupted 0 Vicryl, running 3-0 Vicryl in subcutaneous tissue, and 4-0  subcuticular stitch were placed in skin edges.  Dry dressings were applied.  Sponge and needle count was reported as correct at completion of the procedure.  The patient tolerated the procedure without obvious complication.     Lanelle Bal, MD     EG/MEDQ  D:  12/06/2014  T:  12/07/2014  Job:  [id number]  cc:   Peter M. Martinique, M.D.

## 2014-12-07 NOTE — Progress Notes (Signed)
CT surgery p.m. Rounds  Patient examined and record reviewed.Hemodynamics stable,labs satisfactory.Patient had stable day.Continue current care. Leah [name] 12/07/2014

## 2014-12-07 NOTE — Care Management Note (Signed)
Case Management Note  Patient Details  Name: Leah [name] MRN: [id number] Date of Birth: Sep 06, 1950  Subjective/Objective:     Lives at home with brothers.  Plan for discharge is to go home with brothers who will be with her 24/7 and she states will be able to care for her.               Action/Plan:   Expected Discharge Date:                  Expected Discharge Plan:  Home/Self Care  In-House Referral:     Discharge planning Services  CM Consult  Post Acute Care Choice:    Choice offered to:     DME Arranged:    DME Agency:     HH Arranged:    HH Agency:     Status of Service:  In process, will continue to follow  Medicare Important Message Given:    Date Medicare IM Given:    Medicare IM give by:    Date Additional Medicare IM Given:    Additional Medicare Important Message give by:     If discussed at [place] of Stay Meetings, dates discussed:    Additional Comments:  Vergie Living, RN 12/07/2014, 3:09 PM

## 2014-12-07 NOTE — Progress Notes (Signed)
Patient ID: Leah [name], female   DOB: 05-02-1950, 64 y.o.   MRN: [id number] TCTS DAILY ICU PROGRESS NOTE                   [address]            [place] [address]          [phone]   2 Days Post-Op Procedure(s) (LRB): MITRAL VALVE (MV) REPLACEMENT (N/A) CORONARY ARTERY BYPASS GRAFTING (CABG) (N/A) TRANSESOPHAGEAL ECHOCARDIOGRAM (TEE) (N/A)  Total Length of Stay:  LOS: 2 days   Subjective: awake and alert, up in chair  Objective: Vital signs in last 24 hours: Temp:  [97.5 F (36.4 C)-98.1 F (36.7 C)] 98.1 F (36.7 C) (09/14 0403) Pulse Rate:  [27-80] 78 (09/14 0800) Cardiac Rhythm:  [-] Normal sinus rhythm (09/14 0000) Resp:  [8-25] 11 (09/14 0800) BP: (71-151)/(40-60) 108/54 mmHg (09/14 0800) SpO2:  [86 %-100 %] 97 % (09/14 0800) Arterial Line BP: (87-158)/(35-72) 112/45 mmHg (09/14 0800) Weight:  [172 lb 2.9 oz (78.1 kg)] 172 lb 2.9 oz (78.1 kg) (09/14 0500)  Filed Weights   12/05/14 0557 12/06/14 0530 12/07/14 0500  Weight: 172 lb (78.019 kg) 162 lb 7.7 oz (73.7 kg) 172 lb 2.9 oz (78.1 kg)    Weight change: 9 lb 11.2 oz (4.4 kg)   Hemodynamic parameters for last 24 hours:    Intake/Output from previous day: 09/13 0701 - 09/14 0700 In: 2864.4 [I.V.:2529.4; Blood:335] Out: 2542 [Urine:1200; Chest Tube:280]  Intake/Output this shift: Total I/O In: 20 [I.V.:20] Out: 55 [Urine:35; Chest Tube:20]  Current Meds: Scheduled Meds: . sodium chloride   Intravenous Once  . acetaminophen  1,000 mg Oral 4 times per day   Or  . acetaminophen (TYLENOL) oral liquid 160 mg/5 mL  1,000 mg Per Tube 4 times per day  . antiseptic oral rinse  7 mL Mouth Rinse BID  . aspirin EC  325 mg Oral Daily   Or  . aspirin  324 mg Per Tube Daily  . atorvastatin  80 mg Oral Daily  . bisacodyl  10 mg Oral Daily   Or  . bisacodyl  10 mg Rectal Daily  . docusate sodium  200 mg Oral Daily  . insulin aspart  0-24 Units Subcutaneous 6 times per day  .  insulin aspart  0-24 Units Subcutaneous 6 times per day  . metoprolol tartrate  12.5 mg Oral BID   Or  . metoprolol tartrate  12.5 mg Per Tube BID  . pantoprazole  40 mg Oral Daily  . sodium chloride  3 mL Intravenous Q12H   Continuous Infusions: . sodium chloride 20 mL/hr at 12/07/14 0600  . sodium chloride    . sodium chloride 20 mL/hr at 12/07/14 0700  . dexmedetomidine Stopped (12/05/14 1748)  . DOPamine 3 mcg/kg/min (12/07/14 0700)  . lactated ringers 20 mL/hr at 12/07/14 0700  . lactated ringers 20 mL/hr at 12/07/14 0800  . nitroGLYCERIN    . phenylephrine (NEO-SYNEPHRINE) Adult infusion Stopped (12/07/14 0300)   PRN Meds:.sodium chloride, lactated ringers, metoprolol, midazolam, morphine injection, ondansetron (ZOFRAN) IV, oxyCODONE, sodium chloride, traMADol  General appearance: alert, cooperative and no distress Neurologic: intact Heart: regular rate and rhythm, S1, S2 normal, no murmur, click, rub or gallop Lungs: diminished breath sounds bibasilar Abdomen: soft, non-tender; bowel sounds normal; no masses,  no organomegaly Extremities: extremities normal, atraumatic, no cyanosis or edema and Homans sign is negative, no sign of DVT Wound: sternum stable  Lab Results: CBC: Recent Labs  12/06/14 1630 12/06/14 1632 12/07/14 0315  WBC 14.8*  --  13.2*  HGB 7.5* 7.8* 8.6*  HCT 23.2* 23.0* 26.3*  PLT 80*  --  58*   BMET:  Recent Labs  12/06/14 0440  12/06/14 1632 12/07/14 0315  NA 134*  --  133* 135  K 3.9  --  4.1 3.8  CL 102  --  100* 103  CO2 25  --   --  27  GLUCOSE 124*  --  129* 101*  BUN 7  --  11 9  CREATININE 0.74  < > 0.90 0.81  CALCIUM 9.1  --   --  9.1  < > = values in this interval not displayed.  PT/INR:  Recent Labs  12/07/14 0315  LABPROT 18.9*  INR 1.58*   Radiology: Dg Chest Port 1 View  12/07/2014   CLINICAL DATA:  Postop mitral valve replacement and CABG  EXAM: PORTABLE CHEST - 1 VIEW  COMPARISON:  Portable chest x-ray of  12/06/2014  FINDINGS: Aeration of the left lung base has improved slightly. There may be small effusions present and partial atelectasis of the left base most likely remains. A left chest tube is present and only a tiny left apical pneumothorax is questioned. Cardiomegaly is stable. Swan-Ganz catheter has been removed.  IMPRESSION: Slightly better aeration. Lung Ganz catheter removed left chest tube with tiny left apical pneumothorax   Electronically Signed   By: Ivar Drape M.D.   On: 12/07/2014 08:08     Assessment/Plan: S/P Procedure(s) (LRB): MITRAL VALVE (MV) REPLACEMENT (N/A) CORONARY ARTERY BYPASS GRAFTING (CABG) (N/A) TRANSESOPHAGEAL ECHOCARDIOGRAM (TEE) (N/A) Mobilize Diuresis Diabetes control Start coumadin for 2-3 months Pt consult Still on neo, weaning off Thrombocytopenia- avoid herparin   Leah [name] 12/07/2014 8:20 AM

## 2014-12-08 ENCOUNTER — Inpatient Hospital Stay (HOSPITAL_COMMUNITY): Payer: Medicaid Other

## 2014-12-08 LAB — CBC
HCT: 24.8 % — ABNORMAL LOW (ref 36.0–46.0)
Hemoglobin: 8.1 g/dL — ABNORMAL LOW (ref 12.0–15.0)
MCH: 25.9 pg — ABNORMAL LOW (ref 26.0–34.0)
MCHC: 32.7 g/dL (ref 30.0–36.0)
MCV: 79.2 fL (ref 78.0–100.0)
Platelets: 59 10*3/uL — ABNORMAL LOW (ref 150–400)
RBC: 3.13 MIL/uL — ABNORMAL LOW (ref 3.87–5.11)
RDW: 15.9 % — ABNORMAL HIGH (ref 11.5–15.5)
WBC: 12.3 10*3/uL — ABNORMAL HIGH (ref 4.0–10.5)

## 2014-12-08 LAB — GLUCOSE, CAPILLARY
Glucose-Capillary: 80 mg/dL (ref 65–99)
Glucose-Capillary: 97 mg/dL (ref 65–99)
Glucose-Capillary: 105 mg/dL — ABNORMAL HIGH (ref 65–99)
Glucose-Capillary: 99 mg/dL (ref 65–99)
Glucose-Capillary: 97 mg/dL (ref 65–99)

## 2014-12-08 LAB — PROTIME-INR
INR: 1.51 — ABNORMAL HIGH (ref 0.00–1.49)
Prothrombin Time: 18.2 seconds — ABNORMAL HIGH (ref 11.6–15.2)

## 2014-12-08 LAB — BASIC METABOLIC PANEL
Anion gap: 7 (ref 5–15)
BUN: 6 mg/dL (ref 6–20)
CO2: 28 mmol/L (ref 22–32)
Calcium: 8.8 mg/dL — ABNORMAL LOW (ref 8.9–10.3)
Chloride: 100 mmol/L — ABNORMAL LOW (ref 101–111)
Creatinine, Ser: 0.8 mg/dL (ref 0.44–1.00)
GFR calc Af Amer: >60 mL/min (ref >60)
GFR calc non Af Amer: >60 mL/min (ref >60)
Glucose, Bld: 107 mg/dL — ABNORMAL HIGH (ref 65–99)
Potassium: 3.5 mmol/L (ref 3.5–5.1)
Sodium: 135 mmol/L (ref 135–145)

## 2014-12-08 MED ORDER — BISACODYL 5 MG PO TBEC: 10.0000 mg | DELAYED_RELEASE_TABLET | Freq: Every day | ORAL | Status: DC | PRN

## 2014-12-08 MED ORDER — INSULIN ASPART 100 UNIT/ML ~~LOC~~ SOLN: 0.0000 [IU] | Freq: Three times a day (TID) | SUBCUTANEOUS | Status: DC

## 2014-12-08 MED ORDER — SODIUM CHLORIDE 0.9 % IV SOLN: 250.0000 mL | INTRAVENOUS | Status: DC | PRN

## 2014-12-08 MED ORDER — SODIUM CHLORIDE 0.9 % IJ SOLN: 3.0000 mL | INTRAMUSCULAR | Status: DC | PRN

## 2014-12-08 MED ORDER — SODIUM CHLORIDE 0.9 % IJ SOLN
3.0000 mL | Freq: Two times a day (BID) | INTRAMUSCULAR | Status: DC
  Administered 2014-12-08 – 2014-12-10 (×4): 3 mL via INTRAVENOUS

## 2014-12-08 MED ORDER — BISACODYL 10 MG RE SUPP: 10.0000 mg | Freq: Every day | RECTAL | Status: DC | PRN

## 2014-12-08 MED ORDER — POTASSIUM CHLORIDE CRYS ER 20 MEQ PO TBCR
20.0000 meq | EXTENDED_RELEASE_TABLET | Freq: Every day | ORAL | Status: DC
  Administered 2014-12-08: 20 meq via ORAL
  Filled 2014-12-08: qty 1

## 2014-12-08 MED ORDER — METOPROLOL TARTRATE 12.5 MG HALF TABLET
12.5000 mg | ORAL_TABLET | Freq: Two times a day (BID) | ORAL | Status: DC
  Administered 2014-12-08 – 2014-12-10 (×4): 12.5 mg via ORAL
  Filled 2014-12-08 (×5): qty 1

## 2014-12-08 MED ORDER — ONDANSETRON HCL 4 MG/2ML IJ SOLN: 4.0000 mg | Freq: Four times a day (QID) | INTRAMUSCULAR | Status: DC | PRN

## 2014-12-08 MED ORDER — PANTOPRAZOLE SODIUM 40 MG PO TBEC
40.0000 mg | DELAYED_RELEASE_TABLET | Freq: Every day | ORAL | Status: DC
  Administered 2014-12-09 – 2014-12-10 (×2): 40 mg via ORAL
  Filled 2014-12-08 (×2): qty 1

## 2014-12-08 MED ORDER — DOCUSATE SODIUM 100 MG PO CAPS
200.0000 mg | ORAL_CAPSULE | Freq: Every day | ORAL | Status: DC
  Administered 2014-12-10: 200 mg via ORAL
  Filled 2014-12-08: qty 2

## 2014-12-08 MED ORDER — ONDANSETRON HCL 4 MG PO TABS: 4.0000 mg | ORAL_TABLET | Freq: Four times a day (QID) | ORAL | Status: DC | PRN

## 2014-12-08 MED ORDER — OXYCODONE HCL 5 MG PO TABS
5.0000 mg | ORAL_TABLET | ORAL | Status: DC | PRN
  Administered 2014-12-08: 10 mg via ORAL
  Filled 2014-12-08: qty 2

## 2014-12-08 MED ORDER — ALUM & MAG HYDROXIDE-SIMETH 200-200-20 MG/5ML PO SUSP: 15.0000 mL | ORAL | Status: DC | PRN

## 2014-12-08 MED ORDER — MOVING RIGHT ALONG BOOK
Freq: Once | Status: AC
  Administered 2014-12-08: 10:00:00
  Filled 2014-12-08: qty 1

## 2014-12-08 MED ORDER — FUROSEMIDE 20 MG PO TABS
20.0000 mg | ORAL_TABLET | Freq: Every day | ORAL | Status: DC
Start: 2014-12-08 — End: 2014-12-10
  Administered 2014-12-08 – 2014-12-10 (×3): 20 mg via ORAL
  Filled 2014-12-08 (×3): qty 1

## 2014-12-08 MED ORDER — ASPIRIN EC 81 MG PO TBEC
81.0000 mg | DELAYED_RELEASE_TABLET | Freq: Every day | ORAL | Status: DC
  Administered 2014-12-09 – 2014-12-10 (×2): 81 mg via ORAL
  Filled 2014-12-08 (×2): qty 1

## 2014-12-08 MED ORDER — ACETAMINOPHEN 325 MG PO TABS: 650.0000 mg | ORAL_TABLET | Freq: Four times a day (QID) | ORAL | Status: DC | PRN

## 2014-12-08 MED ORDER — TRAMADOL HCL 50 MG PO TABS
50.0000 mg | ORAL_TABLET | ORAL | Status: DC | PRN
  Administered 2014-12-08 – 2014-12-10 (×4): 50 mg via ORAL
  Filled 2014-12-08 (×4): qty 1

## 2014-12-08 MED ORDER — POTASSIUM CHLORIDE 10 MEQ/50ML IV SOLN
10.0000 meq | INTRAVENOUS | Status: AC | PRN
  Administered 2014-12-08 (×3): 10 meq via INTRAVENOUS

## 2014-12-08 NOTE — Progress Notes (Signed)
Patient ID: Leah [name], female   DOB: Jun 24, 1950, 64 y.o.   MRN: [id number] TCTS DAILY ICU PROGRESS NOTE                   [address]            [place] [address]          [phone]   3 Days Post-Op Procedure(s) (LRB): MITRAL VALVE (MV) REPLACEMENT (N/A) CORONARY ARTERY BYPASS GRAFTING (CABG) (N/A) TRANSESOPHAGEAL ECHOCARDIOGRAM (TEE) (N/A)  Total Length of Stay:  LOS: 3 days   Subjective: Walked in unit this am, feels better  Objective: Vital signs in last 24 hours: Temp:  [97.2 F (36.2 C)-98.9 F (37.2 C)] 97.6 F (36.4 C) (09/15 0755) Pulse Rate:  [59-94] 80 (09/15 0800) Cardiac Rhythm:  [-] Normal sinus rhythm (09/15 0741) Resp:  [12-26] 17 (09/15 0800) BP: (87-148)/(43-66) 121/56 mmHg (09/15 0800) SpO2:  [89 %-100 %] 96 % (09/15 0800) Arterial Line BP: (100-166)/(41-61) 166/61 mmHg (09/14 1800) Weight:  [169 lb 8.5 oz (76.9 kg)] 169 lb 8.5 oz (76.9 kg) (09/15 0500)  Filed Weights   12/06/14 0530 12/07/14 0500 12/08/14 0500  Weight: 162 lb 7.7 oz (73.7 kg) 172 lb 2.9 oz (78.1 kg) 169 lb 8.5 oz (76.9 kg)    Weight change: -2 lb 10.3 oz (-1.2 kg)   Hemodynamic parameters for last 24 hours:    Intake/Output from previous day: 09/14 0701 - 09/15 0700 In: 1423.8 [P.O.:840; I.V.:533.8; IV Piggyback:50] Out: 1510 [Urine:1260; Chest Tube:250]  Intake/Output this shift: Total I/O In: 70 [I.V.:20; IV Piggyback:50] Out: -   Current Meds: Scheduled Meds: . sodium chloride   Intravenous Once  . acetaminophen  1,000 mg Oral 4 times per day   Or  . acetaminophen (TYLENOL) oral liquid 160 mg/5 mL  1,000 mg Per Tube 4 times per day  . antiseptic oral rinse  7 mL Mouth Rinse BID  . aspirin  81 mg Per Tube Daily   Or  . aspirin EC  325 mg Oral Daily  . atorvastatin  80 mg Oral Daily  . bisacodyl  10 mg Oral Daily   Or  . bisacodyl  10 mg Rectal Daily  . docusate sodium  200 mg Oral Daily  . insulin aspart  0-24 Units Subcutaneous 6  times per day  . metoprolol tartrate  12.5 mg Oral BID   Or  . metoprolol tartrate  12.5 mg Per Tube BID  . pantoprazole  40 mg Oral Daily  . sodium chloride  3 mL Intravenous Q12H  . warfarin  2.5 mg Oral q1800  . Warfarin - Physician Dosing Inpatient   Does not apply q1800   Continuous Infusions: . sodium chloride 20 mL/hr at 12/07/14 0600  . sodium chloride    . sodium chloride 20 mL/hr at 12/07/14 0700  . dexmedetomidine Stopped (12/05/14 1748)  . DOPamine Stopped (12/07/14 1800)  . lactated ringers 20 mL/hr at 12/07/14 0800  . lactated ringers 20 mL/hr at 12/08/14 0800  . nitroGLYCERIN    . phenylephrine (NEO-SYNEPHRINE) Adult infusion Stopped (12/07/14 0300)   PRN Meds:.sodium chloride, lactated ringers, metoprolol, midazolam, morphine injection, ondansetron (ZOFRAN) IV, oxyCODONE, potassium chloride, sodium chloride, traMADol  General appearance: alert and cooperative Neurologic: intact Heart: regular rate and rhythm, S1, S2 normal, no murmur, click, rub or gallop Lungs: diminished breath sounds bibasilar Abdomen: soft, non-tender; bowel sounds normal; no masses,  no organomegaly Extremities: extremities normal, atraumatic, no cyanosis or  edema and Homans sign is negative, no sign of DVT Wound: sternum stable  Lab Results: CBC: Recent Labs  12/07/14 0315 12/08/14 0410  WBC 13.2* 12.3*  HGB 8.6* 8.1*  HCT 26.3* 24.8*  PLT 58* 59*   BMET:  Recent Labs  12/07/14 0315 12/08/14 0410  NA 135 135  K 3.8 3.5  CL 103 100*  CO2 27 28  GLUCOSE 101* 107*  BUN 9 6  CREATININE 0.81 0.80  CALCIUM 9.1 8.8*    PT/INR:  Recent Labs  12/08/14 0410  LABPROT 18.2*  INR 1.51*   Radiology: Dg Chest Port 1 View  12/08/2014   CLINICAL DATA:  Followup left pneumothorax  EXAM: PORTABLE CHEST - 1 VIEW  COMPARISON:  12/07/2014  FINDINGS: Stable left chest tube and right vascular sheath. Tiny left apical pneumothorax stable.  Stable mild cardiac enlargement with replacement  cardiac valve. Mildly increased infiltrate right base with mildly increased small right pleural effusion.  IMPRESSION: Stable tiny left pneumothorax.  Mildly increased but still small right pleural effusion and mild but increased infiltrate right lung base.   Electronically Signed   By: Skipper Cliche M.D.   On: 12/08/2014 08:06     Assessment/Plan: S/P Procedure(s) (LRB): MITRAL VALVE (MV) REPLACEMENT (N/A) CORONARY ARTERY BYPASS GRAFTING (CABG) (N/A) TRANSESOPHAGEAL ECHOCARDIOGRAM (TEE) (N/A) Mobilize Diuresis d/c tubes/lines Plan for transfer to step-down: see transfer orders Thrombocytopenia - avoiding heparin On coumadin  [name] 12/08/2014 9:08 AM

## 2014-12-08 NOTE — Evaluation (Signed)
Physical Therapy Evaluation Patient Details Name: Leah [name] MRN: [id number] DOB: 1950-11-16 Today's Date: 12/08/2014   History of Present Illness  Adm for MVR with CABG x1 on 12/06/14. PMHx- CAD, HTN, dCHF  Clinical Impression  Patient is s/p above surgery resulting in functional limitations due to the deficits listed below (see PT Problem List). Patient required frequent reminders to maintain sternal precautions while mobilizing. Patient will benefit from skilled PT to increase their independence and safety with mobility to allow discharge to the venue listed below.       Follow Up Recommendations Home health PT;Supervision/Assistance - 24 hour (due to forgetful of sternal precautions)    Equipment Recommendations  None recommended by PT    Recommendations for Other Services OT consult     Precautions / Restrictions Precautions Precautions: Sternal;Fall      Mobility  Bed Mobility Overal bed mobility: Needs Assistance Bed Mobility: Supine to Sit     Supine to sit: Min assist;HOB elevated     General bed mobility comments: ICU bed; HOB elevated; cues for sternal precuations as she tried to scoot to EOB; assist for scoot  Transfers Overall transfer level: Needs assistance Equipment used: Pushed w/c Transfers: Sit to/from Stand Sit to Stand: Min assist         General transfer comment: vc for sternal precautions; steady assist to sit as pt dizzy and reported bil hip pain (arthritis)  Ambulation/Gait Ambulation/Gait assistance: Min assist Ambulation Distance (Feet): 400 Feet Assistive device:  (push w/c) Gait Pattern/deviations: Step-through pattern;Decreased stride length;Shuffle;Trunk flexed Gait velocity: slow Gait velocity interpretation: <1.8 ft/sec, indicative of risk for recurrent falls General Gait Details: very slow, would slightly incr speed with cues and assist at w/c, however could not maintain even 10 ft; after ~334f pt began to incr weight  bearing on her arms with incr flexed posture and reported bil hip pain due to arthritis. Denied dizziness until ~20 ft from her chair; RN in to assist for safety  Stairs            Wheelchair Mobility    Modified Rankin (Stroke Patients Only)       Balance Overall balance assessment: Needs assistance Sitting-balance support: No upper extremity supported;Feet unsupported Sitting balance-[name]: Good     Standing balance support: Bilateral upper extremity supported Standing balance-[name]: Poor Standing balance comment: especially with incr fatigue and hip pain                             Pertinent Vitals/Pain At rest SaO2 2L 100%; room air 96% Ambulating room air 96%; when pt reported dizziness unable to get good reading and reapplied 2L O2; upon return to main monitor (pt seated), SaO2 99%  BP with appropriate incr with activity (MAP 65 up to 73)  Pain Assessment: 0-10 Pain Score: 4  Pain Location: abd Pain Descriptors / Indicators: Cramping Pain Intervention(s): Limited activity within patient's tolerance;Monitored during session;Repositioned    Home Living Family/patient expects to be discharged to:: Private residence Living Arrangements: Children;Other relatives (brother) Available Help at Discharge: Family;Available 24 hours/day Type of Home: Apartment Home Access: Stairs to enter Entrance Stairs-Rails: Right Entrance Stairs-Number of Steps: 6 Home Layout: Able to live on main level with bedroom/bathroom (using BSC) Home Equipment: Bedside commode;Walker - 2 wheels      Prior Function Level of Independence: Independent               Hand Dominance  Extremity/Trunk Assessment   Upper Extremity Assessment: Overall WFL for tasks assessed           Lower Extremity Assessment: Overall WFL for tasks assessed      Cervical / Trunk Assessment: Normal  Communication   Communication: No difficulties  Cognition  Arousal/Alertness: Awake/alert Behavior During Therapy: WFL for tasks assessed/performed Overall Cognitive Status: Within Functional Limits for tasks assessed                      General Comments      Exercises        Assessment/Plan    PT Assessment Patient needs continued PT services  PT Diagnosis Difficulty walking   PT Problem List Decreased activity tolerance;Decreased balance;Decreased mobility;Decreased knowledge of use of DME;Decreased knowledge of precautions;Pain  PT Treatment Interventions DME instruction;Gait training;Stair training;Functional mobility training;Therapeutic activities;Balance training;Therapeutic exercise;Patient/family education   PT Goals (Current goals can be found in the Care Plan section) Acute Rehab PT Goals Patient Stated Goal: walk without walker PT Goal Formulation: With patient Time For Goal Achievement: 12/15/14 Potential to Achieve Goals: Good    Frequency Min 3X/week   Barriers to discharge        Co-evaluation               End of Session Equipment Utilized During Treatment: Gait belt;Oxygen Activity Tolerance: Patient limited by pain;Treatment limited secondary to medical complications (Comment) (dizziness) Patient left: in chair;with call bell/phone within reach;with family/visitor present Nurse Communication: Mobility status (limited distance due to bil hip pain/arthritis)         Time: 4967-5916 PT Time Calculation (min) (ACUTE ONLY): 32 min   Charges:   PT Evaluation $Initial PT Evaluation Tier I: 1 Procedure PT Treatments $Gait Training: 8-22 mins   PT G Codes:        Jsean Taussig Dec 26, 2014, 1:04 PM Pager [phone]

## 2014-12-09 ENCOUNTER — Inpatient Hospital Stay (HOSPITAL_COMMUNITY): Payer: Medicaid Other

## 2014-12-09 LAB — BASIC METABOLIC PANEL
Anion gap: 10 (ref 5–15)
BUN: 8 mg/dL (ref 6–20)
CO2: 25 mmol/L (ref 22–32)
Calcium: 8.9 mg/dL (ref 8.9–10.3)
Chloride: 101 mmol/L (ref 101–111)
Creatinine, Ser: 0.99 mg/dL (ref 0.44–1.00)
GFR calc Af Amer: >60 mL/min (ref >60)
GFR calc non Af Amer: >60 mL/min (ref >60)
Glucose, Bld: 88 mg/dL (ref 65–99)
Potassium: 3.4 mmol/L — ABNORMAL LOW (ref 3.5–5.1)
Sodium: 136 mmol/L (ref 135–145)

## 2014-12-09 LAB — PROTIME-INR
INR: 1.64 — ABNORMAL HIGH (ref 0.00–1.49)
Prothrombin Time: 19.5 seconds — ABNORMAL HIGH (ref 11.6–15.2)

## 2014-12-09 LAB — CBC
HCT: 25.9 % — ABNORMAL LOW (ref 36.0–46.0)
Hemoglobin: 8.5 g/dL — ABNORMAL LOW (ref 12.0–15.0)
MCH: 25.7 pg — ABNORMAL LOW (ref 26.0–34.0)
MCHC: 32.8 g/dL (ref 30.0–36.0)
MCV: 78.2 fL (ref 78.0–100.0)
Platelets: 88 10*3/uL — ABNORMAL LOW (ref 150–400)
RBC: 3.31 MIL/uL — ABNORMAL LOW (ref 3.87–5.11)
RDW: 16.1 % — ABNORMAL HIGH (ref 11.5–15.5)
WBC: 12.1 10*3/uL — ABNORMAL HIGH (ref 4.0–10.5)

## 2014-12-09 LAB — GLUCOSE, CAPILLARY
Glucose-Capillary: 89 mg/dL (ref 65–99)
Glucose-Capillary: 104 mg/dL — ABNORMAL HIGH (ref 65–99)
Glucose-Capillary: 94 mg/dL (ref 65–99)
Glucose-Capillary: 116 mg/dL — ABNORMAL HIGH (ref 65–99)

## 2014-12-09 MED ORDER — POTASSIUM CHLORIDE CRYS ER 20 MEQ PO TBCR
20.0000 meq | EXTENDED_RELEASE_TABLET | Freq: Every day | ORAL | Status: DC
  Administered 2014-12-09 – 2014-12-10 (×2): 20 meq via ORAL
  Filled 2014-12-09 (×2): qty 1

## 2014-12-09 MED ORDER — POTASSIUM CHLORIDE CRYS ER 20 MEQ PO TBCR
40.0000 meq | EXTENDED_RELEASE_TABLET | Freq: Once | ORAL | Status: AC
  Administered 2014-12-09: 40 meq via ORAL
  Filled 2014-12-09: qty 2

## 2014-12-09 MED ORDER — WARFARIN VIDEO
Freq: Once | Status: AC
  Administered 2014-12-09: 18:00:00

## 2014-12-09 MED ORDER — PATIENT'S GUIDE TO USING COUMADIN BOOK
Freq: Once | Status: AC
  Administered 2014-12-09: 18:00:00
  Filled 2014-12-09: qty 1

## 2014-12-09 NOTE — Discharge Summary (Signed)
Physician Discharge Summary  Patient ID: Leah [name] MRN: [id number] DOB/AGE: March 23, 1951 64 y.o.  Admit date: 12/05/2014 Discharge date: 12/10/2014   Admission Diagnoses:  Patient Active Problem List   Diagnosis Date Noted  . Confusion 11/28/2014  . Slurred speech   . Transient confusion   . Essential hypertension   . Moderate aortic regurgitation 10/26/2014  . Renal vascular disease 10/26/2014  . Acute on chronic diastolic congestive heart failure 10/21/2014  . Tobacco use 10/21/2014  . Troponin level elevated-(felt to be from CHF) 10/21/2014  . Diastolic dysfunction, grade 2 by echo June 2016 10/21/2014  . Carpal tunnel syndrome 10/17/2014  . Left shoulder pain 09/17/2014  . Pulmonary nodule, right-needs repeat CT in Dec 2016 09/17/2014  . Abnormal CXR 08/25/2014  . Severe mitral regurgitation 08/25/2014  . Unstable angina 08/22/2014  . Hematuria 07/01/2014  . Burning sensation of feet 02/01/2014  . Jaw pain 10/18/2012  . Low back pain 07/05/2012  . Insomnia 07/05/2012  . PVC (premature ventricular contraction) 10/02/2011  . Fatigue 09/04/2011  . Chest pain 09/04/2011  . History of angioedema with ACE 2013 06/14/2011  . PVD- s/p multiple proceedures 06/04/2011  . Hyperlipidemia 09/28/2008  . Essential hypertension, benign 09/28/2008  . CAD S/P LAD DES 2009 with 70% ISR 08/24/14 09/28/2008     Discharge Diagnoses:   Patient Active Problem List   Diagnosis Date Noted  . S/P MVR (mitral valve replacement) 12/05/2014  . Confusion 11/28/2014  . Slurred speech   . Transient confusion   . Essential hypertension   . Moderate aortic regurgitation 10/26/2014  . Renal vascular disease 10/26/2014  . Acute on chronic diastolic congestive heart failure 10/21/2014  . Tobacco use 10/21/2014  . Troponin level elevated-(felt to be from CHF) 10/21/2014  . Diastolic dysfunction, grade 2 by echo June 2016 10/21/2014  . Carpal tunnel syndrome 10/17/2014  . Left shoulder  pain 09/17/2014  . Pulmonary nodule, right-needs repeat CT in Dec 2016 09/17/2014  . Abnormal CXR 08/25/2014  . Severe mitral regurgitation 08/25/2014  . Unstable angina 08/22/2014  . Hematuria 07/01/2014  . Burning sensation of feet 02/01/2014  . Jaw pain 10/18/2012  . Low back pain 07/05/2012  . Insomnia 07/05/2012  . PVC (premature ventricular contraction) 10/02/2011  . Fatigue 09/04/2011  . Chest pain 09/04/2011  . History of angioedema with ACE 2013 06/14/2011  . PVD- s/p multiple proceedures 06/04/2011  . Hyperlipidemia 09/28/2008  . Essential hypertension, benign 09/28/2008  . CAD S/P LAD DES 2009 with 70% ISR 08/24/14 09/28/2008   Discharged Condition: good   History of Present Illness:  Ms. Leah [name] is a 64 y/o [place] American female with a history of CAD, (s/p PCI with stenting of the mid LAD in June of 2009 using a 2.5 x 12 mm Promus stent). She has a history of peripheral vascular disease. She has had a left carotid endarterectomy in August of 2009, right external iliac stenting and bilateral renal artery stenting in 2009, and left femoropopliteal bypass grafting by Dr. Kellie [name] in 2012. In August 2013, she had chest pain. A Myoview study showed mild apical ischemia. According to medical records, she presented to the ER on 08/22/14 with chest pain. Cardiac cath done 08/24/14 revealed distal stent restenosis of 70% that was not felt to be hemodynamically significant and the plan is for medical treatment. She was seen then seen in follow up 09/07/14 and was doing well. An echo done during then showed an EF of 60-65%, with moderate to severe  MR, and mild to moderate AR, and grade 2 diastolic dysfunction.  She was seen by her medical doctor after discharge with complaints of gas.  She did not have any other symptoms at that time.  She presented to ER on 10/21/2014 with complaints of mild heaviness in her chest and shortness of breath on 10/21/2014. Her BNP was 1223 and Troponin I was  initially 0.08 and slightly increased to 0.11. CXR showed mild cardiomegaly, central pulmonary vascular congestion and mild bilateral interstitial edema, small bilateral pleural effusions, and a spiculated nodular density on the right (about 5 mm) seen on previous CT was not seen on CXR. TEE done on 10/24/2014 shoed LVEF to 55-60%, moderate AI, severe MR, moderately calcified aorta, and no evidence of thrombus in LA.  [facility] consult was obtained at which time Dr. Servando [name] felt patient would need to quit smoking prior to proceeding with surgery.  She would require right heart cath, PFTs, and a repeat CT scan of her chest.  She presented to [facility] for follow up on 11/10/2014 at which time Dr. Servando [name] felt patient would require CABG, MVR and possible AVR.  The risks and benefits of the procedure were explained to the patient and she was agreeable to proceed.    Hospital Course:   [name] presented to [facility] on 12/05/2014.  She was taken to the operating room and underwent CABG x 1 utilizing LIMA to LAD.  She also underwent Mitral Valve Replacement utilizing a 29 mm Edwards Magna Ease Pericardial Tissue Valve.  She tolerated the procedure without difficulty and was taken to the SICU in stable condition.  During her stay in the SICU the patient was extubated.  She was weaned off Dopamine and Neo Synephrine.  Her chest tubes and arterial lines were removed without difficulty.  She was started on Coumadin which should be continued for 2-3 months.  She was felt medically stable for transfer to the telemetry unit.  She continues to progress.  She is maintaining NSR and her pacing wires were removed without difficulty.  Her INR is trending up at 1.85 and she will remain on 2.5 mg of Coumadin with a goal INR of 2.0-3.0.  She is ambulating without difficulty.  She is tolerating a heart healthy diet.  Should no issues arise we anticipate discharge in the next 24-48 hrs.    Significant Diagnostic Studies:    Cardiac catheterization:   Mid RCA lesion, 20% stenosed.  Ost Cx lesion, 30% stenosed.  Mid Cx lesion, 30% stenosed.  Mid LAD to Dist LAD lesion, 70% stenosed. A drug-eluting stent was placed. The lesion was previously treated with a drug-eluting stent greater than two years ago.  2nd Diag lesion, 30% stenosed.  Ost LAD lesion, 70% stenosed.   ECHO:  - Left ventricle: The cavity size was normal. There was mild concentric hypertrophy. Systolic function was normal. The estimated ejection fraction was in the range of 60% to 65%. Wall motion was normal; there were no regional wall motion abnormalities. Features are consistent with a pseudonormal left ventricular filling pattern, with concomitant abnormal relaxation and increased filling pressure (grade 2 diastolic dysfunction). - Aortic valve: There was mild to moderate regurgitation directed centrally in the LVOT. - Mitral valve: Mild thickening, involving the leaflet margin more than the base, consistent with rheumatic disease. There was moderate to severe regurgitation directed centrally. Valve area by pressure half-time: 2.42 cm^2. - Left atrium: The atrium was moderately to severely dilated. - Pulmonary arteries: PA peak  pressure: 32 mm Hg (S).   Treatments: surgery:   Mitral valve replacement with [facility] pericardial tissue valve, model 7300TFX 29 mm, serial #[id number] and coronary artery bypass grafting x1 with the left internal mammary to the left anterior descending coronary artery. Closure of left atrial appendage.   Disposition: 01-Home or Self Care   Discharge Medications:   Medication List    STOP taking these medications        carvedilol 25 MG tablet  Commonly known as:  COREG      TAKE these medications        acetaminophen 325 MG tablet  Commonly known as:  TYLENOL  Take 2 tablets (650 mg total) by mouth every 4 (four) hours as needed for fever, headache or mild  pain.     albuterol 108 (90 BASE) MCG/ACT inhaler  Commonly known as:  PROVENTIL HFA;VENTOLIN HFA  Inhale 2 puffs into the lungs every 6 (six) hours as needed for wheezing or shortness of breath.     amLODipine 10 MG tablet  Commonly known as:  NORVASC  Take 1 tablet (10 mg total) by mouth daily.     aspirin EC 81 MG tablet  Take 1 tablet (81 mg total) by mouth daily.     atorvastatin 80 MG tablet  Commonly known as:  LIPITOR  Take 1 tablet (80 mg total) by mouth daily.     cyclobenzaprine 10 MG tablet  Commonly known as:  FLEXERIL  TAKE 1 TABLET BY MOUTH 3 TIMES DAILY AS NEEDED FOR MUSCLE SPASMS     furosemide 20 MG tablet  Commonly known as:  LASIX  Take 1 tablet (20 mg total) by mouth daily. X 5 days     metoprolol tartrate 25 MG tablet  Commonly known as:  LOPRESSOR  Take 0.5 tablets (12.5 mg total) by mouth 2 (two) times daily.     nitroGLYCERIN 0.4 MG SL tablet  Commonly known as:  NITROSTAT  Place 1 tablet (0.4 mg total) under the tongue every 5 (five) minutes x 3 doses as needed for chest pain.     potassium chloride SA 20 MEQ tablet  Commonly known as:  K-DUR,KLOR-CON  Take 1 tablet (20 mEq total) by mouth daily. X 5 days     traMADol 50 MG tablet  Commonly known as:  ULTRAM  Take 1-2 tablets (50-100 mg total) by mouth every 6 (six) hours as needed (pain).     traZODone 100 MG tablet  Commonly known as:  DESYREL  Take 1 tablet (100 mg total) by mouth at bedtime as needed for sleep.     warfarin 2.5 MG tablet  Commonly known as:  COUMADIN  Take 1 tablet (2.5 mg total) by mouth daily. Or as directed by the Coumadin Clinic        The patient has been discharged on:   1.Beta Blocker:  Yes [ x  ]                              No   [   ]                              If No, reason:  2.Ace Inhibitor/ARB: Yes [   ]  No  [  x  ]                                     If No, reason: Allergy/intolerance  3.Statin:   Yes [x    ]                  No  [   ]                  If No, reason:  4.Ecasa:  Yes  [ x  ]                  No   [   ]                  If No, reason:         Discharge Instructions    Amb Referral to Cardiac Rehabilitation    Complete by:  As directed   Congestive Heart Failure: If diagnosis is Heart Failure, patient MUST meet each of the CMS criteria: 1. Left Ventricular Ejection Fraction </= 35% 2. NYHA class II-IV symptoms despite being on optimal heart failure therapy for at least 6 weeks. 3. Stable = have not had a recent (<6 weeks) or planned (<6 months) major cardiovascular hospitalization or procedure  Program Details: - Physician supervised classes - 1-3 classes per week over a 12-18 week period, generally for a total of 36 sessions  Physician Certification: I certify that the above Cardiac Rehabilitation treatment is medically necessary and is medically approved by me for treatment of this patient. The patient is willing and cooperative, able to ambulate and medically stable to participate in exercise rehabilitation. The participant's progress and Individualized Treatment Plan will be reviewed by the Medical Director, Cardiac Rehab staff and as indicated by the Referring/Ordering Physician.  Diagnosis:   CABG Valve Replacement/Repair              Follow Up: Follow-up Information    Follow up with [name], MD On 01/12/2015.   Specialty:  Cardiothoracic Surgery   Why:  Appointment is at 11:30   Contact information:   [place] [address] [phone]       Follow up with  [facility] On 01/12/2015.   Why:  Please get CXR at 11:00   Contact information:   [place]       Follow up with [employer] [facility] On 12/13/2014.   Specialty:  Cardiology   Why:  Please have PT/INR drawn at Coumadin Clinic at 10:30   Contact information:   [address] [place] [phone]      Follow up with Tarri Fuller, PA-C On 12/26/2014.   Specialties:  Physician Assistant, Radiology, Interventional Cardiology   Why:  Appointment with cardiology PA is at 2:00   Contact information:   [place] [address] [phone]       Signed: Burke Keels 12/10/2014, 7:45 AM

## 2014-12-09 NOTE — Progress Notes (Signed)
CARDIAC REHAB PHASE I   PRE:  Rate/Rhythm: 38 SR with PVCs    BP: sitting 116/61    SaO2: 93 2L, 90-91 RA  MODE:  Ambulation: 350 ft   POST:  Rate/Rhythm: 93 SR with PVCs    BP: sitting 118/47     SaO2: 94 RA  Pt moving well with reminders for sternal precautions. Slow pace with RW walking. saO2 better walking than sitting or in bed. Encouraged IS. Ed completed in recliner. Voiced understanding. Pt sts she quit smoking 2 months ago. I congratulated pt and encouraged continued cessation. Interested in [place] and will send referral to [place]. [place], [place], ACSM 12/09/2014 2:10 PM

## 2014-12-09 NOTE — Discharge Instructions (Signed)
Mitral Valve Replacement, Care After Refer to this sheet in the next few weeks. These instructions provide you with information on caring for yourself after your procedure. Your health care provider may also give you specific instructions. Your treatment has been planned according to current medical practices, but problems sometimes occur. Call your health care provider if you have any problems or questions after your procedure.  HOME CARE INSTRUCTIONS   Take medicines only as directed by your health care provider.  Take your temperature every morning for the first 7 days after surgery. Write these down.  Weigh yourself every morning for at least 7 days after surgery. Write your weight down.  Wear elastic stockings during the day for at least 2 weeks after surgery or as directed by your health care provider. Use them longer if your ankles are swollen. The stockings help blood flow and help reduce swelling in the legs.  Take frequent naps or rest often throughout the day.  Avoid lifting more than 10 lb (4.5 kg) or pushing or pulling things with your arms for 6-8 weeks or as directed by your health care provider.  Avoid driving or airplane travel for 4-6 weeks after surgery or as directed. If you are riding in a car for an extended period, stop every 1-2 hours to stretch your legs.  Avoid crossing your legs.  Avoid climbing stairs and using the handrail to pull yourself up for the first 2-3 weeks after surgery.  Do not take baths for 2-4 weeks after surgery. Take showers once your health care provider approves. Pat incisions dry. Do not rub incisions with a washcloth or towel.  Return to work as directed by your health care provider.  Drink enough fluids to keep your urine clear or pale yellow.  Do not strain to have a bowel movement. Eat high-fiber foods if you become constipated. You may also take a medicine to help you have a bowel movement (laxative) as directed by your health care  provider.  Resume sexual activity as directed by your health care provider. SEEK MEDICAL CARE IF:   You develop a skin rash.   Your weight is increasing each day over 2-3 days.  Your weight increases by 2 or more pounds (1 kg) in a single day.  You have a fever. SEEK IMMEDIATE MEDICAL CARE IF:   You develop chest pain that is not coming from your incision.  You develop shortness of breath or difficulty breathing.  You have drainage, redness, swelling, or pain at your incision site.  You have pus coming from your incision.  You develop light-headedness. MAKE SURE YOU:  Understand these directions.  Will watch your condition.  Will get help right away if you are not doing well or get worse. Document Released: 09/28/2004 Document Revised: 07/26/2013 Document Reviewed: 08/11/2012 [facility] Patient Information 2015 [place], [place]. This information is not intended to replace advice given to you by your health care provider. Make sure you discuss any questions you have with your health care provider.

## 2014-12-09 NOTE — Progress Notes (Signed)
Epicardial pacing wires X2 removed per protocol without difficulty. Tips intact. Pt tolerated well. VS  And rhythm stable. Pt to remain on bedrest for 1 hour with VS monitoring. Pt educated.

## 2014-12-09 NOTE — Clinical Documentation Improvement (Signed)
Cardiothoracic  For greater specificity, please document the "Acuity" of the patient's "expected blood loss anemia":   Acute   Other   Unable to clinically determine   (please document your response in the progress notes and not on the query form itself)  Please exercise your independent, professional judgment when responding. A specific answer is not anticipated or expected.   Thank You,  Erling Conte  RN BSN CCDS [phone] Health Information Management [place]

## 2014-12-09 NOTE — Progress Notes (Addendum)
      [address]       Leah [name],Leah [name] [id number]             [phone]      4 Days Post-Op Procedure(s) (LRB): MITRAL VALVE (MV) REPLACEMENT (N/A) CORONARY ARTERY BYPASS GRAFTING (CABG) (N/A) TRANSESOPHAGEAL ECHOCARDIOGRAM (TEE) (N/A)   Subjective:  Leah [name] is a little sore this morning.  Otherwise she states she is doing pretty good. + ambulation  + BM  Objective: Vital signs in last 24 hours: Temp:  [97.5 F (36.4 C)-99.7 F (37.6 C)] 99.7 F (37.6 C) (09/16 0519) Pulse Rate:  [28-92] 86 (09/16 0519) Cardiac Rhythm:  [-] Normal sinus rhythm;Heart block (09/15 2005) Resp:  [13-23] 16 (09/16 0519) BP: (102-134)/(37-71) 111/64 mmHg (09/16 0519) SpO2:  [89 %-98 %] 91 % (09/16 0519) Weight:  [179 lb 3.2 oz (81.285 kg)] 179 lb 3.2 oz (81.285 kg) (09/16 0519)  Intake/Output from previous day: 09/15 0701 - 09/16 0700 In: 400 [P.O.:240; I.V.:60; IV Piggyback:100] Out: 550 [Urine:550]  General appearance: alert, cooperative and no distress Heart: regular rate and rhythm Lungs: clear to auscultation bilaterally Abdomen: soft, non-tender; bowel sounds normal; no masses,  no organomegaly Extremities: edema trace Wound: clean and dry  Lab Results:  Recent Labs  12/08/14 0410 12/09/14 0558  WBC 12.3* 12.1*  HGB 8.1* 8.5*  HCT 24.8* 25.9*  PLT 59* 88*   BMET:  Recent Labs  12/08/14 0410 12/09/14 0558  NA 135 136  K 3.5 3.4*  CL 100* 101  CO2 28 25  GLUCOSE 107* 88  BUN 6 8  CREATININE 0.80 0.99  CALCIUM 8.8* 8.9    PT/INR:  Recent Labs  12/09/14 0558  LABPROT 19.5*  INR 1.64*   ABG    Component Value Date/Time   PHART 7.338* 12/06/2014 0126   HCO3 24.3* 12/06/2014 0126   TCO2 23 12/06/2014 1632   ACIDBASEDEF 2.0 12/06/2014 0126   O2SAT 96.0 12/06/2014 0126   CBG (last 3)   Recent Labs  12/08/14 1551 12/08/14 2156 12/09/14 0619  GLUCAP 99 97 89    Assessment/Plan: S/P Procedure(s) (LRB): MITRAL VALVE (MV) REPLACEMENT  (N/A) CORONARY ARTERY BYPASS GRAFTING (CABG) (N/A) TRANSESOPHAGEAL ECHOCARDIOGRAM (TEE) (N/A)  1. CV- hemodynamically stable, continue Lopressor 2. Pulm- wean oxygen as tolerated, small right pleural effusion, continue diuretics/IS 3. Renal- creatinine WNL, remains hypervolemic on Lasix at 20, will increase 40 mg daily 4. INR 1.64, will continue Coumadin at 2.5 5. Thrombocytopenia- improving, plt count up to 88, continue to hold heparin 6. Dispo- patient stable, maintaining NSR, will d/c EPW, continue coumadin, likely d/c this weekend   LOS: 4 days    BARRETT, ERIN 12/09/2014

## 2014-12-09 NOTE — Evaluation (Signed)
Occupational Therapy Evaluation Patient Details Name: Leah [name] MRN: [id number] DOB: [birth year] Today's Date: 12/09/2014    History of Present Illness Adm for MVR with CABG x1 on 12/06/14. PMHx- CAD, HTN, dCHF   Clinical Impression    Pt demonstrates decline in function and safety with ADLs and ADL mobility with decreased strength, balance an endurance. Pt would benefit from acute OT services to address impairments to increase level of function          Follow Up Recommendations  Home health OT;Supervision/Assistance - 24 hour    Equipment Recommendations  Other (comment) (reacher, LH bath sponge)    Recommendations for Other Services       Precautions / Restrictions Precautions Precautions: Sternal;Fall Precaution Comments: pt able to recall sternal precautions Restrictions Weight Bearing Restrictions: Yes      Mobility Bed Mobility               General bed mobility comments: pt up in recliner upon entering room  Transfers Overall transfer level: Needs assistance Equipment used: Standard walker Transfers: Sit to/from Stand Sit to Stand: Min assist         General transfer comment: vc for sternal precautions    Balance     Sitting balance-[name]: Good     Standing balance support: During functional activity Standing balance-[name]: Fair                              ADL Overall ADL's : Needs assistance/impaired     Grooming: Wash/dry hands;Wash/dry face;Standing;Supervision/safety   Upper Body Bathing: Supervision/ safety;Set up;Sitting   Lower Body Bathing: Minimal assistance   Upper Body Dressing : Supervision/safety;Set up;Sitting   Lower Body Dressing: Minimal assistance   Toilet Transfer: Minimal assistance;Regular Toilet   Toileting- Clothing Manipulation and Hygiene: Supervision/safety   Tub/ Shower Transfer: Minimal assistance;3 in 1   Functional mobility during ADLs: Minimal  assistance General ADL Comments: educated pt on A/E use for energy conservation     Vision Vision Assessment?: No apparent visual deficits Visual Fields: No apparent deficits   Agricultural engineer Tested?: No   Praxis Praxis Praxis tested?: Not tested    Pertinent Vitals/Pain Pain Assessment: No/denies pain     Hand Dominance Right   Extremity/Trunk Assessment Upper Extremity Assessment Upper Extremity Assessment: Overall WFL for tasks assessed;Generalized weakness   Lower Extremity Assessment Lower Extremity Assessment: Defer to PT evaluation   Cervical / Trunk Assessment Cervical / Trunk Assessment: Normal   Communication Communication Communication: No difficulties   Cognition Arousal/Alertness: Awake/alert Behavior During Therapy: WFL for tasks assessed/performed Overall Cognitive Status: Within Functional Limits for tasks assessed                     General Comments   pt pleasant and cooperative                 Home Living Family/patient expects to be discharged to:: Private residence Living Arrangements: Children;Other relatives Available Help at Discharge: Family;Available 24 hours/day Type of Home: Apartment Home Access: Stairs to enter Entrance Stairs-Number of Steps: 6 Entrance Stairs-Rails: Right Home Layout: Able to live on main level with bedroom/bathroom Alternate Level Stairs-Number of Steps: 8 Alternate Level Stairs-Rails: Left Bathroom Shower/Tub: Teacher, early years/pre: Standard     Home Equipment: Bedside commode;Walker - 2 wheels          Prior Functioning/Environment Level of Independence: Independent  OT Diagnosis: Generalized weakness   OT Problem List: Impaired balance (sitting and/or standing);Decreased activity tolerance;Cardiopulmonary status limiting activity;Decreased knowledge of use of DME or AE;Decreased strength;Decreased knowledge of precautions   OT  Treatment/Interventions: Self-care/ADL training;Energy conservation;Therapeutic activities;DME and/or AE instruction;Patient/family education    OT Goals(Current goals can be found in the care plan section) Acute Rehab OT Goals Patient Stated Goal: walk without walker OT Goal Formulation: With patient ADL Goals Pt Will Perform Grooming: with set-up;standing Pt Will Perform Upper Body Bathing: with set-up;sitting;standing Pt Will Perform Lower Body Bathing: with min guard assist;with supervision;with set-up;sitting/lateral leans;sit to/from stand;with adaptive equipment Pt Will Perform Upper Body Dressing: with set-up;sitting;standing Pt Will Perform Lower Body Dressing: with adaptive equipment;with min guard assist;with supervision;with set-up;sitting/lateral leans;sit to/from stand Pt Will Transfer to Toilet: with min guard assist;with supervision;ambulating Pt Will Perform Toileting - Clothing Manipulation and hygiene: with supervision;sit to/from stand Pt Will Perform Tub/Shower Transfer: with min guard assist;with supervision;shower seat Additional ADL Goal #1: pt will verbalize and adhere to sternal preacuitons and energy conservation techniques during ADLs  OT Frequency: Min 2X/week   Barriers to D/C:  none                        End of Session    Activity Tolerance: Patient tolerated treatment well Patient left: with call bell/phone within reach;in chair   Time: 8614-8307 OT Time Calculation (min): 25 min Charges:  OT General Charges $OT Visit: 1 Procedure OT Evaluation $Initial OT Evaluation Tier I: 1 Procedure OT Treatments $Therapeutic Activity: 8-22 mins G-Codes:    Britt Bottom 12/09/2014, 1:47 PM

## 2014-12-09 NOTE — Care Management Note (Addendum)
Case Management Note  Patient Details  Name: Leah [name] MRN: [id number] Date of Birth: 1950/08/23  Subjective/Objective:     Lives at home with brothers. Pt has PCP with Family Medicine PCP Kathrine Cords, pt has orange card and denies having hardship obtaining mediations.  Plan for discharge is to go home with brothers who will be with her 24/7 and she states will be able to care for her.               Action/Plan:  Pt is independent from home s/p CABG with mitral valve repair.   Expected Discharge Date:                  Expected Discharge Plan:  Home/Self Care  In-House Referral:     Discharge planning Services  CM Consult  Post Acute Care Choice:    Choice offered to:     DME Arranged:    DME Agency:     HH Arranged:    HH Agency:     Status of Service:  In process, will continue to follow  Medicare Important Message Given:    Date Medicare IM Given:    Medicare IM give by:    Date Additional Medicare IM Given:    Additional Medicare Important Message give by:     If discussed at [place] of Stay Meetings, dates discussed:    Additional Comments: PT/OT have placed recommendations for [facility], however pt does not currently have insurance.  CM contact [place] for possible charity [facility] and was informed that pt does not have a qualifying diagnosis for [facility] PT/OT.  Pt declined outpt PT/OT due to hardship. MD made aware of via physician sticky note.  CM verified that pt will have 24 hour supervision provided by brothers post discharge.   Maryclare Labrador, RN 12/09/2014, 2:17 PM

## 2014-12-09 NOTE — Progress Notes (Signed)
   12/09/14 1000  Mobility  Activity Ambulate in hall  Level of Assistance Modified independent, requires aide device or extra time  Assistive Device Front wheel walker  Distance Ambulated (ft) 150 ft  Ambulation Response Tolerated well  Pt ambulated slowly without difficulty.

## 2014-12-09 NOTE — Progress Notes (Signed)
Patient transferred to 2W11. Bedside report received from Gae Bon, [place].

## 2014-12-10 LAB — GLUCOSE, CAPILLARY: Glucose-Capillary: 107 mg/dL — ABNORMAL HIGH (ref 65–99)

## 2014-12-10 LAB — PROTIME-INR
INR: 1.85 — ABNORMAL HIGH (ref 0.00–1.49)
Prothrombin Time: 21.3 seconds — ABNORMAL HIGH (ref 11.6–15.2)

## 2014-12-10 MED ORDER — POTASSIUM CHLORIDE CRYS ER 20 MEQ PO TBCR: 20.0000 meq | EXTENDED_RELEASE_TABLET | Freq: Every day | ORAL | Status: DC

## 2014-12-10 MED ORDER — WARFARIN SODIUM 2.5 MG PO TABS: 2.5000 mg | ORAL_TABLET | Freq: Every day | ORAL | Status: DC

## 2014-12-10 MED ORDER — TRAMADOL HCL 50 MG PO TABS: 50.0000 mg | ORAL_TABLET | Freq: Four times a day (QID) | ORAL | Status: DC | PRN

## 2014-12-10 MED ORDER — FUROSEMIDE 20 MG PO TABS: 20.0000 mg | ORAL_TABLET | Freq: Every day | ORAL | Status: DC

## 2014-12-10 MED ORDER — METOPROLOL TARTRATE 25 MG PO TABS: 12.5000 mg | ORAL_TABLET | Freq: Two times a day (BID) | ORAL | Status: DC

## 2014-12-10 NOTE — Progress Notes (Signed)
12/10/2014 1040 Discharge AVS meds taken today and those due this evening reviewed.  Follow-up appointments and when to call md reviewed.  Special post Cardiac Surgery instructions discussed.  D/C IV and TELE.  Questions and concerns addressed.   D/C home per orders. [place]

## 2014-12-10 NOTE — Progress Notes (Addendum)
       [address]       [place] [address]             [phone]          5 Days Post-Op Procedure(s) (LRB): MITRAL VALVE (MV) REPLACEMENT (N/A) CORONARY ARTERY BYPASS GRAFTING (CABG) (N/A) TRANSESOPHAGEAL ECHOCARDIOGRAM (TEE) (N/A)  Subjective: Feels well, no complaints.   Objective: Vital signs in last 24 hours: Patient Vitals for the past 24 hrs:  BP Temp Temp src Pulse Resp SpO2 Weight  12/10/14 0615 129/82 mmHg 98.2 F (36.8 C) Oral 80 18 94 % 176 lb 9.4 oz (80.1 kg)  12/09/14 2202 98/81 mmHg 98.6 F (37 C) Oral 83 18 96 % -  12/09/14 1444 (!) 107/58 mmHg 98.5 F (36.9 C) Oral 81 18 96 % -  12/09/14 1110 116/61 mmHg - - 82 - - -  12/09/14 1055 140/78 mmHg - - - - - -  12/09/14 1040 138/78 mmHg - - - - - -  12/09/14 1025 140/70 mmHg - - 82 - 90 % -  12/09/14 1000 139/70 mmHg - - 87 - (!) 89 % -  12/09/14 0955 - - - - - 93 % -  12/09/14 0944 (!) 141/62 mmHg - - 88 - - -   Current Weight  12/10/14 176 lb 9.4 oz (80.1 kg)   PRE-OPERATIVE WEIGHT: 78 kg   Intake/Output from previous day: 09/16 0701 - 09/17 0700 In: 240 [P.O.:240] Out: 700 [Urine:700]    PHYSICAL EXAM:  Heart: RRR Lungs: Clear Wound: Clean and dry Extremities: No significant LE edema    Lab Results: CBC: Recent Labs  12/08/14 0410 12/09/14 0558  WBC 12.3* 12.1*  HGB 8.1* 8.5*  HCT 24.8* 25.9*  PLT 59* 88*   BMET:  Recent Labs  12/08/14 0410 12/09/14 0558  NA 135 136  K 3.5 3.4*  CL 100* 101  CO2 28 25  GLUCOSE 107* 88  BUN 6 8  CREATININE 0.80 0.99  CALCIUM 8.8* 8.9    PT/INR:  Recent Labs  12/10/14 0620  LABPROT 21.3*  INR 1.85*      Assessment/Plan: S/P Procedure(s) (LRB): MITRAL VALVE (MV) REPLACEMENT (N/A) CORONARY ARTERY BYPASS GRAFTING (CABG) (N/A) TRANSESOPHAGEAL ECHOCARDIOGRAM (TEE) (N/A)  CV- BPs stable, SR. Continue Lopressor.  Vol overload- continue diuresis.  Expected postop blood loss anemia- H/H stable.  Postop  thrombocytopenia- plts slowly improving. No new labs today.  Hopefully home later today.  She does not have insurance, so will not qualify for [facility]. Will arrange OP follow up in Coumadin Clinic.   LOS: 5 days    [name],[name] 12/10/2014

## 2014-12-14 ENCOUNTER — Other Ambulatory Visit: Payer: Self-pay | Admitting: Cardiology

## 2014-12-14 ENCOUNTER — Ambulatory Visit (INDEPENDENT_AMBULATORY_CARE_PROVIDER_SITE_OTHER)
Payer: No Typology Code available for payment source | Admitting: Pharmacist Clinician (PhC)/ Clinical Pharmacy Specialist

## 2014-12-14 DIAGNOSIS — Z952 Presence of prosthetic heart valve: Secondary | ICD-10-CM

## 2014-12-14 DIAGNOSIS — Z954 Presence of other heart-valve replacement: Secondary | ICD-10-CM

## 2014-12-14 DIAGNOSIS — Z7901 Long term (current) use of anticoagulants: Secondary | ICD-10-CM

## 2014-12-14 LAB — POCT INR: INR: 6.3

## 2014-12-15 LAB — PROTIME-INR
INR: 3.81 — AB (ref <1.50)
PROTHROMBIN TIME: 38.1 s — AB (ref 11.6–15.2)

## 2014-12-19 ENCOUNTER — Ambulatory Visit (INDEPENDENT_AMBULATORY_CARE_PROVIDER_SITE_OTHER)
Payer: No Typology Code available for payment source | Admitting: Pharmacist Clinician (PhC)/ Clinical Pharmacy Specialist

## 2014-12-19 ENCOUNTER — Encounter (INDEPENDENT_AMBULATORY_CARE_PROVIDER_SITE_OTHER): Payer: Self-pay

## 2014-12-19 DIAGNOSIS — I340 Nonrheumatic mitral (valve) insufficiency: Secondary | ICD-10-CM

## 2014-12-19 DIAGNOSIS — Z952 Presence of prosthetic heart valve: Secondary | ICD-10-CM

## 2014-12-19 DIAGNOSIS — Z954 Presence of other heart-valve replacement: Secondary | ICD-10-CM

## 2014-12-19 DIAGNOSIS — Z7901 Long term (current) use of anticoagulants: Secondary | ICD-10-CM

## 2014-12-19 LAB — POCT INR: INR: 5.1

## 2014-12-19 NOTE — Patient Instructions (Signed)
Leah Beets RN called to Dr. Lucia Estelle office - patient is to go straight from here to their office to have someone evaluate her incisions.

## 2014-12-23 DIAGNOSIS — R531 Weakness: Secondary | ICD-10-CM | POA: Insufficient documentation

## 2014-12-26 ENCOUNTER — Ambulatory Visit (INDEPENDENT_AMBULATORY_CARE_PROVIDER_SITE_OTHER)
Payer: No Typology Code available for payment source | Admitting: Pharmacist Clinician (PhC)/ Clinical Pharmacy Specialist

## 2014-12-26 ENCOUNTER — Telehealth: Payer: Self-pay | Admitting: Family Medicine

## 2014-12-26 ENCOUNTER — Other Ambulatory Visit: Payer: Self-pay | Admitting: Family Medicine

## 2014-12-26 ENCOUNTER — Encounter: Payer: Self-pay | Admitting: Physician Assistant

## 2014-12-26 ENCOUNTER — Ambulatory Visit (INDEPENDENT_AMBULATORY_CARE_PROVIDER_SITE_OTHER): Payer: No Typology Code available for payment source | Admitting: Physician Assistant

## 2014-12-26 VITALS — BP 102/56 | HR 82 | Ht 73.5 in | Wt 164.6 lb

## 2014-12-26 DIAGNOSIS — I739 Peripheral vascular disease, unspecified: Secondary | ICD-10-CM

## 2014-12-26 DIAGNOSIS — Z7901 Long term (current) use of anticoagulants: Secondary | ICD-10-CM

## 2014-12-26 DIAGNOSIS — I519 Heart disease, unspecified: Secondary | ICD-10-CM

## 2014-12-26 DIAGNOSIS — I2510 Atherosclerotic heart disease of native coronary artery without angina pectoris: Secondary | ICD-10-CM

## 2014-12-26 DIAGNOSIS — Z952 Presence of prosthetic heart valve: Secondary | ICD-10-CM

## 2014-12-26 DIAGNOSIS — M545 Low back pain, unspecified: Secondary | ICD-10-CM

## 2014-12-26 DIAGNOSIS — Z954 Presence of other heart-valve replacement: Secondary | ICD-10-CM

## 2014-12-26 DIAGNOSIS — E785 Hyperlipidemia, unspecified: Secondary | ICD-10-CM

## 2014-12-26 DIAGNOSIS — I5189 Other ill-defined heart diseases: Secondary | ICD-10-CM

## 2014-12-26 DIAGNOSIS — I10 Essential (primary) hypertension: Secondary | ICD-10-CM

## 2014-12-26 DIAGNOSIS — Z9861 Coronary angioplasty status: Secondary | ICD-10-CM

## 2014-12-26 LAB — POCT INR: INR: 5

## 2014-12-26 MED ORDER — TRAMADOL HCL 50 MG PO TABS: 50.0000 mg | ORAL_TABLET | Freq: Four times a day (QID) | ORAL | Status: DC | PRN

## 2014-12-26 MED ORDER — CYCLOBENZAPRINE HCL 10 MG PO TABS: ORAL_TABLET | ORAL | Status: DC

## 2014-12-26 MED ORDER — AMLODIPINE BESYLATE 5 MG PO TABS: 5.0000 mg | ORAL_TABLET | Freq: Every day | ORAL | Status: DC

## 2014-12-26 NOTE — Telephone Encounter (Signed)
Patient informed, will be by tomorrow to pick up rx's

## 2014-12-26 NOTE — Patient Instructions (Signed)
Your physician has recommended you make the following change in your medication: DECREASE AMLODIPINE TO '5MG'$  DAILY.  A NEW RX HAS BEEN SENT TO YOUR PHARMACY.  Your physician recommends that you schedule a follow-up appointment in: [place]. [place].

## 2014-12-26 NOTE — Progress Notes (Signed)
Date:  12/26/2014   ID:  Leah [name], DOB 03/16/1951, MRN [id number]  PCP:  Kathrine Cords, MD  Primary Cardiologist:  [place]  Chief Complaint  Patient presents with  . Follow-up    S/P CABG.  No chest pain other than surgical site soreness.  Occas. SOB due to inactivity.  No edema or dizziness.     History of Present Illness: Leah [name] is a 64 y.o. female  64 y/o [place] American female with a history of CAD, (s/p PCI with stenting of the mid LAD in June of 2009 using a 2.5 x 12 mm Promus stent). She has a history of peripheral vascular disease. She has had a left carotid endarterectomy in August of 2009, right external iliac stenting and bilateral renal artery stenting in 2009, and left femoropopliteal bypass grafting by Dr. Kellie Simmering in 2012. In August 2013, she had chest pain. A Myoview study showed mild apical ischemia. According to medical records, she presented to the ER on 08/22/14 with chest pain. Cardiac cath done 08/24/14 revealed distal stent restenosis of 70% that was not felt to be hemodynamically significant and the plan is for medical treatment. She was seen then seen in follow up 09/07/14 and was doing well. An echo done during then showed an EF of 60-65%, with moderate to severe MR, and mild to moderate AR, and grade 2 diastolic dysfunction.   Patient was admitted from 12/05/2014 to 12/10/2014 at which time she underwent coronary artery bypass grafting 1 with a LIMA to the LAD as well as mitral valve replacement with a 29 mm Edwards magna ease pericardial tissue valve. She was started on Coumadin at that time which she should continue for 2-3 months.  She is here for posthospital evaluation.  Her last INR on September 26 was 5.1.  She is due to have it checked today.   She reports some shoulder and neck pain which she relates to the way she sleeps. She also still has a surgical wound on her epigastric area which is open and she is dressing it daily. She denies any  discharge or smell from the site.  She has some chest soreness  In mild dizziness at times but otherwise denies nausea, vomiting, fever, chest pain, shortness of breath, orthopnea, PND, cough, congestion, abdominal pain, hematochezia, melena, lower extremity edema.   She has finished her course of Lasix.  Wt Readings from Last 3 Encounters:  12/26/14 74.662 kg (164 lb 9.6 oz)  12/10/14 80.1 kg (176 lb 9.4 oz)  12/02/14 78.427 kg (172 lb 14.4 oz)     Past Medical History  Diagnosis Date  . Hyperlipidemia   . Hypertension   . GERD (gastroesophageal reflux disease)   . Leg pain   . Headache(784.0)   . Carotid artery occlusion   . COPD (chronic obstructive pulmonary disease) ([place])   . CAD (coronary artery disease)   . Peripheral vascular disease ([place])   . Tobacco abuse   . Shortness of breath dyspnea   . Renal vascular disease 10/26/2014    bilateral stents placed   . Anemia   . Severe mitral regurgitation     Current Outpatient Prescriptions  Medication Sig Dispense Refill  . acetaminophen (TYLENOL) 325 MG tablet Take 2 tablets (650 mg total) by mouth every 4 (four) hours as needed for fever, headache or mild pain.    Marland Kitchen albuterol (PROVENTIL HFA;VENTOLIN HFA) 108 (90 BASE) MCG/ACT inhaler Inhale 2 puffs into the lungs every 6 (six)  hours as needed for wheezing or shortness of breath. 1 Inhaler 0  . amLODipine (NORVASC) 10 MG tablet Take 1 tablet (10 mg total) by mouth daily. 30 tablet 2  . aspirin EC 81 MG tablet Take 1 tablet (81 mg total) by mouth daily.    Marland Kitchen atorvastatin (LIPITOR) 80 MG tablet Take 1 tablet (80 mg total) by mouth daily. 30 tablet 2  . cyclobenzaprine (FLEXERIL) 10 MG tablet TAKE 1 TABLET BY MOUTH 3 TIMES DAILY AS NEEDED FOR MUSCLE SPASMS 70 tablet 0  . metoprolol tartrate (LOPRESSOR) 25 MG tablet Take 0.5 tablets (12.5 mg total) by mouth 2 (two) times daily. 60 tablet 1  . nitroGLYCERIN (NITROSTAT) 0.4 MG SL tablet Place 1 tablet (0.4 mg total) under the tongue every  5 (five) minutes x 3 doses as needed for chest pain. 25 tablet 12  . traMADol (ULTRAM) 50 MG tablet Take 1-2 tablets (50-100 mg total) by mouth every 6 (six) hours as needed (pain). 30 tablet 0  . traZODone (DESYREL) 100 MG tablet Take 1 tablet (100 mg total) by mouth at bedtime as needed for sleep. 30 tablet 2  . warfarin (COUMADIN) 2.5 MG tablet Take 1 tablet (2.5 mg total) by mouth daily. Or as directed by the Coumadin Clinic 60 tablet 1  . [DISCONTINUED] simvastatin (ZOCOR) 40 MG tablet Take 40 mg by mouth at bedtime.      No current facility-administered medications for this visit.    Allergies:    Allergies  Allergen Reactions  . Lisinopril Swelling    Angioedema 06/10/11  . Aspirin Other (See Comments)    Upset stomach  . Chantix [Varenicline]     insomnia  . Penicillins Hives    Social History:  The patient  reports that she quit smoking about 8 weeks ago. Her smoking use included Cigarettes. She has a 17.5 pack-year smoking history. She has never used smokeless tobacco. She reports that she does not drink alcohol or use illicit drugs.   Family history:   Family History  Problem Relation Age of Onset  . Cancer Mother     BRAIN AND LUNG  . Hypertension Father   . Heart disease Father   . Heart attack Neg Hx   . Stroke Paternal Aunt     great aunt  . Hypertension Mother   . Hypertension Brother     ROS:  Please see the history of present illness.  All other systems reviewed and negative.   PHYSICAL EXAM: VS:  BP 102/56 mmHg  Pulse 82  Ht 6' 1.5" (1.867 m)  Wt 74.662 kg (164 lb 9.6 oz)  BMI 21.42 kg/m2 Well nourished, well developed, in no acute distress HEENT: Pupils are equal round react to light accommodation extraocular movements are intact.  Neck: no JVDNo cervical lymphadenopathy. Cardiac:  Irregular rate and rhythm 1/6 systolic murmur at RSB Lungs:  clear to auscultation bilaterally, no wheezing, rhonchi or rales Abd: soft, nontender, positive bowel sounds  all quadrants, no hepatosplenomegaly Ext: no lower extremity edema.  2+ radial and dorsalis pedis pulses. Skin: warm and dry.   She is approximately 1 cm wound in the epigastric area on the left side.   This is one of the surgical wounds.  There is no signs of infection. No erythema or discharge. It appears to be granulating in. Neuro:  Grossly normal  EKG:  Sinus rhythm with frequent  PVCs  rate 82 bpm  ASSESSMENT AND PLAN:  Problem List Items Addressed This Visit  S/P MVR (mitral valve replacement)   PVD- s/p multiple proceedures   Long-term (current) use of anticoagulants   Hyperlipidemia   Essential hypertension, benign   Diastolic dysfunction, grade 2 by echo June 2016   CAD S/P LAD DES 2009 with 70% ISR 08/24/14 - Primary (Chronic)   Relevant Orders   EKG 12-Lead     Coronary artery disease Status post coronary bypass grafting 1 with a LIMA to the LAD.   Some residual chest soreness.  Status post mitral valve replacement  29 mm Edwards magna ease pericardial tissue valve. Hyperlipidemia    continue statin Peripheral vascular disease   no particular complaints at this time. Long-term use anticoagulate  INR 5. Being managed by our pharmacist Essential hypertension   blood pressure is low 102/56.   I have decreased her amlodipine 5 mg daily. Diastolic dysfunction   She appears euvolemic.  she is no longer taking Lasix or potassium.  surgical wound epigastric area  No signs of infection. Patient is keeping it dressed daily.     recommended she start walking more frequently than she has been.   Follow-up as scheduled with Dr. [place]

## 2014-12-26 NOTE — Telephone Encounter (Signed)
I have that the patient should have STOPPED taking carvedilol per her heart surgeon ans she started taking metoprolol twice daily instead. I cannot refill the coreg and she should have enough metoprolol as she was prescribed it on 12/10/14. Additionally, it looks like she should have only been on potassium x 5 days.   I have refilled her Flexeril and tramadol. Please ask her to keep her appt with me.   Thanks, Archie Patten, MD [facility] Resident  12/26/2014, 1:35 PM

## 2014-12-26 NOTE — Telephone Encounter (Signed)
Pt called and needs refills on her Potassium and Carvedilol sent to [facility]. She also needs her Tramadol and flexeril refill and left up front for pick up. Please call patient when ready to pick up. jw

## 2015-01-02 ENCOUNTER — Ambulatory Visit (INDEPENDENT_AMBULATORY_CARE_PROVIDER_SITE_OTHER)
Payer: No Typology Code available for payment source | Admitting: Pharmacist Clinician (PhC)/ Clinical Pharmacy Specialist

## 2015-01-02 DIAGNOSIS — Z952 Presence of prosthetic heart valve: Secondary | ICD-10-CM

## 2015-01-02 DIAGNOSIS — Z7901 Long term (current) use of anticoagulants: Secondary | ICD-10-CM

## 2015-01-02 DIAGNOSIS — Z954 Presence of other heart-valve replacement: Secondary | ICD-10-CM

## 2015-01-02 LAB — POCT INR: INR: 1.6

## 2015-01-04 ENCOUNTER — Ambulatory Visit: Payer: No Typology Code available for payment source | Admitting: Family Medicine

## 2015-01-09 ENCOUNTER — Ambulatory Visit (INDEPENDENT_AMBULATORY_CARE_PROVIDER_SITE_OTHER)
Payer: No Typology Code available for payment source | Admitting: Pharmacist Clinician (PhC)/ Clinical Pharmacy Specialist

## 2015-01-09 DIAGNOSIS — Z952 Presence of prosthetic heart valve: Secondary | ICD-10-CM

## 2015-01-09 DIAGNOSIS — Z7901 Long term (current) use of anticoagulants: Secondary | ICD-10-CM

## 2015-01-09 DIAGNOSIS — Z954 Presence of other heart-valve replacement: Secondary | ICD-10-CM

## 2015-01-09 LAB — POCT INR: INR: 2.1

## 2015-01-12 ENCOUNTER — Encounter: Payer: No Typology Code available for payment source | Admitting: Cardiothoracic Surgery

## 2015-01-18 ENCOUNTER — Other Ambulatory Visit: Payer: Self-pay | Admitting: Cardiothoracic Surgery

## 2015-01-18 DIAGNOSIS — Z951 Presence of aortocoronary bypass graft: Secondary | ICD-10-CM

## 2015-01-19 ENCOUNTER — Other Ambulatory Visit: Payer: Self-pay | Admitting: Cardiothoracic Surgery

## 2015-01-19 ENCOUNTER — Ambulatory Visit (INDEPENDENT_AMBULATORY_CARE_PROVIDER_SITE_OTHER): Payer: Self-pay | Admitting: Cardiothoracic Surgery

## 2015-01-19 ENCOUNTER — Ambulatory Visit
Admission: RE | Admit: 2015-01-19 | Discharge: 2015-01-19 | Disposition: A | Discharge status: Home / Self Care | Payer: Medicaid Other | Source: Ambulatory Visit | Attending: Cardiothoracic Surgery | Admitting: Cardiothoracic Surgery

## 2015-01-19 ENCOUNTER — Ambulatory Visit (HOSPITAL_COMMUNITY): Payer: No Typology Code available for payment source

## 2015-01-19 ENCOUNTER — Encounter: Payer: Self-pay | Admitting: Cardiothoracic Surgery

## 2015-01-19 VITALS — BP 176/101 | HR 97 | Resp 16 | Ht 73.5 in | Wt 174.0 lb

## 2015-01-19 DIAGNOSIS — Z954 Presence of other heart-valve replacement: Secondary | ICD-10-CM

## 2015-01-19 DIAGNOSIS — Z951 Presence of aortocoronary bypass graft: Secondary | ICD-10-CM

## 2015-01-19 DIAGNOSIS — I25110 Atherosclerotic heart disease of native coronary artery with unstable angina pectoris: Secondary | ICD-10-CM

## 2015-01-19 DIAGNOSIS — Z952 Presence of prosthetic heart valve: Secondary | ICD-10-CM

## 2015-01-19 DIAGNOSIS — I340 Nonrheumatic mitral (valve) insufficiency: Secondary | ICD-10-CM

## 2015-01-19 DIAGNOSIS — R911 Solitary pulmonary nodule: Secondary | ICD-10-CM

## 2015-01-19 MED ORDER — AMLODIPINE BESYLATE 5 MG PO TABS: 10.0000 mg | ORAL_TABLET | Freq: Every day | ORAL | Status: DC

## 2015-01-19 NOTE — Patient Instructions (Signed)
Endocarditis Information  You may be at risk for developing endocarditis since you have  an artificial heart valve  or a repaired heart valve. Endocarditis is an infection of the lining of the heart or heart valves.   Certain surgical and dental procedures may put you at risk,  such as teeth cleaning or other dental procedures or any surgery involving the respiratory, urinary, gastrointestinal tract, gallbladder or prostate.   Notify your doctor or dentist before having any invasive procedures. You will need to take antibiotics before certain procedures.   To prevent endocarditis, maintain good oral health. Seek prompt medical attention for any mouth/gum, skin or urinary tract infections.   Heart  Valve Replacement-Care After  Read the instructions outlined below and refer to this sheet for the next few weeks. These discharge instructions provide you with general information on caring for yourself after you leave the hospital. Your surgeon may also give you specific instructions. While your treatment has been planned according to the most current medical practices available, unavoidable complications occasionally occur. If you have any problems or questions after discharge, please call your surgeon. AFTER THE PROCEDURE  Full recovery from heart valve surgery can take several months.   Blood thinning (anticoagulation) treatment with warfarin is some times prescribed for 6 weeks to 3 months after surgery for those with biological valves. It is prescribed for life for those with mechanical valves.   Recovery includes healing of the surgical incision. There is a gradual building of stamina and exercise abilities. An exercise program under the direction of a physical therapist may be recommended.   Once you have an artificial valve, your heart function and your life will return to normal. You usually feel better after surgery. Shortness of breath and fatigue should lessen. If your heart was already  severely damaged before your surgery, you may continue to have problems.    Individuals with an aortic valve replacement need to take antibiotics before having dental work or other surgical procedures. This is called prophylactic antibiotic treatment. These drugs help to prevent infective endocarditis. Antibiotics are only recommended for individuals with the highest risk for developing infective endocarditis. Let your dentist and your caregiver know if you have a history of any of the following so that the necessary precautions can be taken:  Endocarditis in the past.   An artificial (prosthetic) heart valve.  HOME CARE INSTRUCTIONS   Use all medications as prescribed.   Take your temperature every morning for the first week after surgery. Record these.   Weigh yourself every morning for at least the first week after surgery and record.   Do not lift more than 15 pounds until your breastbone (sternum) has healed, about 3 months. Avoid all activities which would place strain on your incision.   You may shower as soon as directed by your caregiver after surgery. Pat incisions dry. Do not rub incisions with washcloth or towel.   Avoid driving for 4 weeks following surgery or as instructed.  Pain Control  If a prescription was given for a pain reliever, please follow your doctor's directions.   If the pain is not relieved by your medicine, becomes worse, or you have difficulty breathing, call your surgeon.  Activity  Take frequent rest periods throughout the day.   Wait one week before returning to strenuous activities such as heavy lifting (more than 10 pounds), pushing or pulling.   Talk with your doctor about when you may return to work and your exercise routine.  Do not drive while taking prescription pain medication.  Nutrition  You may resume your normal diet.   Drink plenty of fluids (6-8 glasses a day).   Eat a well-balanced diet.   Call your caregiver for persistent  nausea or vomiting.  Elimination Your normal bowel function should return. If constipation should occur, you may:  Take a mild laxative.   Add fruit and bran to your diet.   Drink more fluids.   Call your doctor if constipation is not relieved.  SEEK IMMEDIATE MEDICAL CARE IF:   You develop chest pain which is not coming from your surgical cut (incision).   You develop shortness of breath or have difficulty breathing.   You develop a temperature over 101 F (38.3 C).   You have a sudden weight gain. Let your caregiver know what the weight gain is.   You develop a rash.   You develop any reaction or side effects to medications given.   You have increased bleeding from wounds.   You see redness, swelling, or have increasing pain in wounds.   You have pus coming from your wound.   You develop lightheadedness or feel faint.

## 2015-01-19 NOTE — Progress Notes (Signed)
[address]       [place] [address]             [phone]      Leah [name] [facility] Medical Record #[id number] Date of Birth: May 10, 1950  Referring: Martinique, Peter M, MD Primary Care: Kathrine Cords, MD  Chief Complaint:   POST OP FOLLOW UP 12/05/2014  OPERATIVE REPORT PREOPERATIVE DIAGNOSES: 1. Severe mitral insufficiency. 2. Aortic insufficiency. 3. Coronary occlusive disease. POSTOPERATIVE DIAGNOSES: 1. Severe mitral insufficiency. 2. Aortic insufficiency. 3. Coronary occlusive disease. 4. Aortic insufficiency, mild. PROCEDURE PERFORMED: Mitral valve replacement with [facility] pericardial tissue valve, model 7300TFX 29 mm, serial #[id number] and coronary artery bypass grafting x1 with the left internal mammary to the left anterior descending coronary artery. Closure of left atrial appendage. SURGEON: Lanelle Bal, MD.  History of Present Illness:      Patient doing well postoperatively. She's been able to increase her physical activity without shortness of breath and fatigue that she been experiencing during the summer. She continues to take her Coumadin as instructed her INR has been difficult to control but is now just over 2. When she was last in the cardiology office her blood pressure medications were decreased she is now very hypertensive with diastolic blood pressure over 100.    Past Medical History  Diagnosis Date  . Hyperlipidemia   . Hypertension   . GERD (gastroesophageal reflux disease)   . Leg pain   . Headache(784.0)   . Carotid artery occlusion   . COPD (chronic obstructive pulmonary disease) ([place])   . CAD (coronary artery disease)   . Peripheral vascular disease ([place])   . Tobacco abuse   . Shortness of breath dyspnea   . Renal vascular disease 10/26/2014    bilateral stents placed   . Anemia   . Severe mitral regurgitation      History  Smoking status  . Former Smoker -- 0.50 packs/day for  35 years  . Types: Cigarettes  . Quit date: 10/31/2014  Smokeless tobacco  . Never Used    Comment: has not started wellbutrin yet    History  Alcohol Use No     Allergies  Allergen Reactions  . Lisinopril Swelling    Angioedema 06/10/11  . Aspirin Other (See Comments)    Upset stomach  . Chantix [Varenicline]     insomnia  . Penicillins Hives    Current Outpatient Prescriptions  Medication Sig Dispense Refill  . acetaminophen (TYLENOL) 325 MG tablet Take 2 tablets (650 mg total) by mouth every 4 (four) hours as needed for fever, headache or mild pain.    Marland Kitchen albuterol (PROVENTIL HFA;VENTOLIN HFA) 108 (90 BASE) MCG/ACT inhaler Inhale 2 puffs into the lungs every 6 (six) hours as needed for wheezing or shortness of breath. 1 Inhaler 0  . amLODipine (NORVASC) 5 MG tablet Take 2 tablets (10 mg total) by mouth daily. 30 tablet 6  . aspirin EC 81 MG tablet Take 1 tablet (81 mg total) by mouth daily.    Marland Kitchen atorvastatin (LIPITOR) 80 MG tablet Take 1 tablet (80 mg total) by mouth daily. 30 tablet 2  . cyclobenzaprine (FLEXERIL) 10 MG tablet TAKE 1 TABLET BY MOUTH 3 TIMES DAILY AS NEEDED FOR MUSCLE SPASMS 70 tablet 0  . metoprolol tartrate (LOPRESSOR) 25 MG tablet Take 0.5 tablets (12.5 mg total) by mouth 2 (two) times daily. 60 tablet 1  . nitroGLYCERIN (NITROSTAT) 0.4 MG SL tablet Place 1  tablet (0.4 mg total) under the tongue every 5 (five) minutes x 3 doses as needed for chest pain. 25 tablet 12  . traMADol (ULTRAM) 50 MG tablet Take 1-2 tablets (50-100 mg total) by mouth every 6 (six) hours as needed (pain). 30 tablet 0  . traZODone (DESYREL) 100 MG tablet Take 1 tablet (100 mg total) by mouth at bedtime as needed for sleep. 30 tablet 2  . warfarin (COUMADIN) 2.5 MG tablet Take 1 tablet (2.5 mg total) by mouth daily. Or as directed by the Coumadin Clinic 60 tablet 1  . [DISCONTINUED] simvastatin (ZOCOR) 40 MG tablet Take 40 mg by mouth at bedtime.      No current facility-administered  medications for this visit.       Physical Exam: BP 176/101 mmHg  Pulse 97  Resp 16  Ht 6' 1.5" (1.867 m)  Wt 174 lb (78.926 kg)  BMI 22.64 kg/m2  SpO2 98%  General appearance: alert and cooperative Neurologic: intact Heart: regular rate and rhythm, S1, S2 normal, no murmur, click, rub or gallop Lungs: clear to auscultation bilaterally Abdomen: soft, non-tender; bowel sounds normal; no masses,  no organomegaly Extremities: extremities normal, atraumatic, no cyanosis or edema and Homans sign is negative, no sign of DVT Wound: very smll escar at left chest tube site, treated with silver nitrate No murmur of mitral insufficiency  Diagnostic Studies & Laboratory data:     Recent Radiology Findings:   Dg Chest 2 View  01/19/2015  CLINICAL DATA:  CABG. EXAM: CHEST  2 VIEW COMPARISON:  12/09/2014 FINDINGS: Chronic cardiomegaly with stable appearance from prior. Stable aortic tortuosity. Changes of CABG and mitral valve replacement. There is no edema, consolidation, effusion, or pneumothorax. Emphysematous changes. No acute osseous finding. IMPRESSION: Resolved postoperative effusions and atelectasis. No evidence of active disease. Emphysema. Electronically Signed   By: Monte Fantasia M.D.   On: 01/19/2015 13:25    CLINICAL DATA: Re-evaluate right upper lobe pulmonary nodule, prior to possible valve surgery. Initial encounter.  EXAM: CT CHEST WITHOUT CONTRAST  TECHNIQUE: Multidetector CT imaging of the chest was performed following the standard protocol without IV contrast.  COMPARISON: CT of the chest performed 08/25/2014  FINDINGS: The small slightly spiculated nodule at the right lung apex is again seen; there is a small associated cystic component which appears to have increased mildly in size, while the solid component has decreased slightly in size, measuring approximately 5 mm. Given the slight apparent interval decrease in size of the solid component from the  recent prior study, this is thought more likely to be postinfectious in nature, though malignancy cannot be entirely excluded.  No additional pulmonary nodules are seen. The lungs are otherwise clear, aside from mild scarring or atelectasis at the left lung base. No pleural effusion or pneumothorax is seen.  Scattered coronary artery calcifications are noted. The mediastinum is otherwise unremarkable. No mediastinal lymphadenopathy is seen. No pericardial effusion is identified. The great vessels are grossly unremarkable, aside from scattered calcification. Mild calcification is noted along the thoracic aorta. The visualized portions of the thyroid gland are unremarkable. No axillary lymphadenopathy is appreciated.  No acute osseous abnormalities are identified.  IMPRESSION: 1. Small slightly spiculated nodule at the right lung apex is again seen. It has a small cystic component which appears to have increased mildly in size, while the solid component has decreased slightly in size, measuring approximately 5 mm. Given the slight apparent interval decrease in size of the solid component from the recent  prior CT, this is thought more likely to be postinfectious in nature, though malignancy cannot be entirely excluded. As this remains too small to further characterize, would recommend strict follow-up CT of the chest in 3-6 months, to ensure stability. 2. Mild scarring or atelectasis at the left lung base. 3. Scattered coronary artery calcifications seen.   Electronically Signed  By: Garald Balding M.D.  On: 10/27/2014 19:40  Recent Lab Findings: Lab Results  Component Value Date   WBC 12.1* 12/09/2014   HGB 8.5* 12/09/2014   HCT 25.9* 12/09/2014   PLT 88* 12/09/2014   GLUCOSE 88 12/09/2014   CHOL 181 08/24/2014   TRIG 88 08/24/2014   HDL 33* 08/24/2014   LDLDIRECT 185* 09/09/2011   LDLCALC 130* 08/24/2014   ALT 12* 11/28/2014   AST 16 11/28/2014   NA 136  12/09/2014   K 3.4* 12/09/2014   CL 101 12/09/2014   CREATININE 0.99 12/09/2014   BUN 8 12/09/2014   CO2 25 12/09/2014   TSH 1.756 11/28/2014   INR 2.1 01/09/2015   HGBA1C 6.3* 12/02/2014      Assessment / Plan:     BP elevated will increase amlodipine back to 10 mg day Doing well following coronary artery bypass grafting and mitral valve replacement for rheumatic valvular disease Currently stable on Coumadin In June 2016 she was noted to have a very small right upper lobe lung nodule preoperatively a repeat scan was done in August, that showed the solid component to decrease in size. I discussed with the patient the need to have a repeat CT to continue to follow this nodule which is approximate 5 mm in size. She'll return in December with a follow-up CT scan of the chest  She will return to Dr. Doug Sou office to have her blood pressure checked and for further modification of her blood pressure medications as needed.    [name] MD      [address] [place] [address] Office [phone]   Beeper [phone]  01/19/2015 2:08 PM

## 2015-01-20 ENCOUNTER — Ambulatory Visit (INDEPENDENT_AMBULATORY_CARE_PROVIDER_SITE_OTHER): Payer: Medicaid Other | Admitting: Pharmacist Clinician (PhC)/ Clinical Pharmacy Specialist

## 2015-01-20 DIAGNOSIS — Z954 Presence of other heart-valve replacement: Secondary | ICD-10-CM

## 2015-01-20 DIAGNOSIS — Z952 Presence of prosthetic heart valve: Secondary | ICD-10-CM

## 2015-01-20 DIAGNOSIS — Z7901 Long term (current) use of anticoagulants: Secondary | ICD-10-CM

## 2015-01-20 LAB — POCT INR: INR: 2.5

## 2015-01-23 ENCOUNTER — Other Ambulatory Visit: Payer: Self-pay | Admitting: Family Medicine

## 2015-01-23 ENCOUNTER — Ambulatory Visit (HOSPITAL_COMMUNITY): Payer: No Typology Code available for payment source

## 2015-01-23 DIAGNOSIS — M545 Low back pain, unspecified: Secondary | ICD-10-CM

## 2015-01-23 DIAGNOSIS — G4700 Insomnia, unspecified: Secondary | ICD-10-CM

## 2015-01-23 NOTE — Telephone Encounter (Signed)
Now has medicaid and would like to have her RX called into the [facility] on [address]. trazadone Tramadol Atorvastatin Flexeril

## 2015-01-24 MED ORDER — TRAZODONE HCL 100 MG PO TABS: 100.0000 mg | ORAL_TABLET | Freq: Every evening | ORAL | Status: DC | PRN

## 2015-01-24 MED ORDER — ATORVASTATIN CALCIUM 80 MG PO TABS: 80.0000 mg | ORAL_TABLET | Freq: Every day | ORAL | Status: DC

## 2015-01-24 MED ORDER — TRAMADOL HCL 50 MG PO TABS: 50.0000 mg | ORAL_TABLET | Freq: Four times a day (QID) | ORAL | Status: DC | PRN

## 2015-01-24 MED ORDER — CYCLOBENZAPRINE HCL 10 MG PO TABS: ORAL_TABLET | ORAL | Status: DC

## 2015-01-24 NOTE — Telephone Encounter (Signed)
Patient informed, states she will be at appointment on 11/18.

## 2015-01-24 NOTE — Telephone Encounter (Signed)
Please let the patient know that a Rx for tramadol, trazodone, and Flexeril are at the front desk, the others are at the pharmacy. She MUST keep her appt on 11/18 or I will be unable to provide her any more Rxs for tramadol.  Thanks, Archie Patten, MD [facility] Resident  01/24/2015, 1:13 PM

## 2015-01-25 ENCOUNTER — Ambulatory Visit (HOSPITAL_COMMUNITY): Payer: No Typology Code available for payment source

## 2015-01-27 ENCOUNTER — Ambulatory Visit (HOSPITAL_COMMUNITY): Payer: No Typology Code available for payment source

## 2015-01-30 ENCOUNTER — Ambulatory Visit (HOSPITAL_COMMUNITY): Payer: No Typology Code available for payment source

## 2015-02-01 ENCOUNTER — Ambulatory Visit (HOSPITAL_COMMUNITY): Payer: No Typology Code available for payment source

## 2015-02-03 ENCOUNTER — Ambulatory Visit (HOSPITAL_COMMUNITY): Payer: No Typology Code available for payment source

## 2015-02-06 ENCOUNTER — Ambulatory Visit (HOSPITAL_COMMUNITY): Payer: No Typology Code available for payment source

## 2015-02-08 ENCOUNTER — Ambulatory Visit (HOSPITAL_COMMUNITY): Payer: No Typology Code available for payment source

## 2015-02-10 ENCOUNTER — Encounter: Payer: Self-pay | Admitting: Family Medicine

## 2015-02-10 ENCOUNTER — Ambulatory Visit (INDEPENDENT_AMBULATORY_CARE_PROVIDER_SITE_OTHER): Payer: Medicaid Other | Admitting: Family Medicine

## 2015-02-10 ENCOUNTER — Ambulatory Visit (INDEPENDENT_AMBULATORY_CARE_PROVIDER_SITE_OTHER): Payer: Medicaid Other | Admitting: Pharmacist Clinician (PhC)/ Clinical Pharmacy Specialist

## 2015-02-10 ENCOUNTER — Ambulatory Visit (HOSPITAL_COMMUNITY): Payer: No Typology Code available for payment source

## 2015-02-10 VITALS — BP 125/80 | HR 80 | Temp 98.1°F | Ht 73.5 in | Wt 171.1 lb

## 2015-02-10 DIAGNOSIS — I493 Ventricular premature depolarization: Secondary | ICD-10-CM | POA: Diagnosis not present

## 2015-02-10 DIAGNOSIS — Z7901 Long term (current) use of anticoagulants: Secondary | ICD-10-CM | POA: Diagnosis not present

## 2015-02-10 DIAGNOSIS — Z954 Presence of other heart-valve replacement: Secondary | ICD-10-CM | POA: Diagnosis not present

## 2015-02-10 DIAGNOSIS — G4700 Insomnia, unspecified: Secondary | ICD-10-CM

## 2015-02-10 DIAGNOSIS — Z952 Presence of prosthetic heart valve: Secondary | ICD-10-CM

## 2015-02-10 DIAGNOSIS — M545 Low back pain, unspecified: Secondary | ICD-10-CM

## 2015-02-10 DIAGNOSIS — I10 Essential (primary) hypertension: Secondary | ICD-10-CM | POA: Diagnosis not present

## 2015-02-10 LAB — POCT INR: INR: 1.8

## 2015-02-10 MED ORDER — TRAMADOL HCL 50 MG PO TABS: 50.0000 mg | ORAL_TABLET | Freq: Four times a day (QID) | ORAL | Status: DC | PRN

## 2015-02-10 MED ORDER — CYCLOBENZAPRINE HCL 10 MG PO TABS: ORAL_TABLET | ORAL | Status: DC

## 2015-02-10 MED ORDER — METOPROLOL TARTRATE 25 MG PO TABS: ORAL_TABLET | ORAL | Status: DC

## 2015-02-10 MED ORDER — TRAZODONE HCL 100 MG PO TABS: 100.0000 mg | ORAL_TABLET | Freq: Every evening | ORAL | Status: DC | PRN

## 2015-02-10 MED ORDER — AMLODIPINE BESYLATE 5 MG PO TABS: 10.0000 mg | ORAL_TABLET | Freq: Every day | ORAL | Status: DC

## 2015-02-10 MED ORDER — METOPROLOL TARTRATE 25 MG PO TABS: 12.5000 mg | ORAL_TABLET | Freq: Two times a day (BID) | ORAL | Status: DC

## 2015-02-10 NOTE — Patient Instructions (Addendum)
It was good to see you again. Congratulations on staying cigarette free! Continue to take the amlodipine '10mg'$  daily. Please follow up with me in 1 month or sooner as needed.

## 2015-02-10 NOTE — Progress Notes (Signed)
Patient ID: Leah [name], female   DOB: 08-21-1950, 64 y.o.   MRN: [id number]    Subjective: CC: hospital f/u HPI: Patient is a 65 y.o. female with a past medical history of rheumatic disease, HTN, CAD presenting to clinic today for a hospital follow up. She is s/p MVR with pericardial tissue valve and CABG x 1 with L IMA to the LAD on 9/12.   She had some issues with drainage at the surgical site early on but this has improved. Walking more. No chest pain or SOB with walking. She is currently on warfarin for 2-3 months, managed by cards. No bleeding or easy bruising noted.   Hypertension Blood pressure at home: 160/90 (old cuff) Blood pressure today: 169/100; 125/80 Meds: Compliant with amlodipine and metoprolol Side effects: None ROS: Denies headache, dizziness, visual changes, nausea, vomiting, chest pain, abdominal pain or shortness of breath.  Lumbar pain Pain pretty well controlled currently with tramadol. Pain is midline and in the paraspinal muscles bilaterally. It feels pulling and tight, sometimes throbbing. Now that she's s/p CABG and MVR she's been able to walk more which may be helping her some.  No radicular pain. No urinary or bowel incontinence. No saddle paresthesias.  Social History: continues to be smoke free!   Health Maintenance: CT chest 08/2014: R lung apex spiculated nodule, f/u CT chest Dec 2016  ROS: All other systems reviewed and are negative.  Past Medical History Patient Active Problem List   Diagnosis Date Noted  . Weakness   . Long-term (current) use of anticoagulants 12/14/2014  . S/P MVR (mitral valve replacement) 12/05/2014  . Moderate aortic regurgitation 10/26/2014  . Renal vascular disease 10/26/2014  . Acute on chronic diastolic congestive heart failure ([place]) 10/21/2014  . Tobacco use 10/21/2014  . Troponin level elevated-(felt to be from CHF) 10/21/2014  . Diastolic dysfunction, grade 2 by echo June 2016 10/21/2014  . Carpal tunnel  syndrome 10/17/2014  . Left shoulder pain 09/17/2014  . Pulmonary nodule, right-needs repeat CT in Dec 2016 09/17/2014  . Abnormal CXR 08/25/2014  . Severe mitral regurgitation 08/25/2014  . Unstable angina ([place]) 08/22/2014  . Hematuria 07/01/2014  . Low back pain 07/05/2012  . Insomnia 07/05/2012  . PVC (premature ventricular contraction) 10/02/2011  . Fatigue 09/04/2011  . Chest pain 09/04/2011  . History of angioedema with ACE 2013 06/14/2011  . PVD- s/p multiple proceedures 06/04/2011  . Hyperlipidemia 09/28/2008  . Essential hypertension, benign 09/28/2008  . CAD S/P LAD DES 2009 with 70% ISR 08/24/14 09/28/2008     Medications- reviewed and updated Current Outpatient Prescriptions  Medication Sig Dispense Refill  . acetaminophen (TYLENOL) 325 MG tablet Take 2 tablets (650 mg total) by mouth every 4 (four) hours as needed for fever, headache or mild pain.    Marland Kitchen albuterol (PROVENTIL HFA;VENTOLIN HFA) 108 (90 BASE) MCG/ACT inhaler Inhale 2 puffs into the lungs every 6 (six) hours as needed for wheezing or shortness of breath. 1 Inhaler 0  . amLODipine (NORVASC) 5 MG tablet Take 2 tablets (10 mg total) by mouth daily. 60 tablet 2  . aspirin EC 81 MG tablet Take 1 tablet (81 mg total) by mouth daily.    Marland Kitchen atorvastatin (LIPITOR) 80 MG tablet Take 1 tablet (80 mg total) by mouth daily. 30 tablet 2  . cyclobenzaprine (FLEXERIL) 10 MG tablet TAKE 1 TABLET BY MOUTH 3 TIMES DAILY AS NEEDED FOR MUSCLE SPASMS 70 tablet 0  . metoprolol tartrate (LOPRESSOR) 25 MG tablet Take  0.5 tablets (12.5 mg total) by mouth 2 (two) times daily. 30 tablet 0  . nitroGLYCERIN (NITROSTAT) 0.4 MG SL tablet Place 1 tablet (0.4 mg total) under the tongue every 5 (five) minutes x 3 doses as needed for chest pain. 25 tablet 12  . traMADol (ULTRAM) 50 MG tablet Take 1-2 tablets (50-100 mg total) by mouth every 6 (six) hours as needed (pain). 30 tablet 0  . traZODone (DESYREL) 100 MG tablet Take 1 tablet (100 mg total)  by mouth at bedtime as needed for sleep. 30 tablet 2  . warfarin (COUMADIN) 2.5 MG tablet Take 1 tablet (2.5 mg total) by mouth daily. Or as directed by the Coumadin Clinic 60 tablet 1  . [DISCONTINUED] simvastatin (ZOCOR) 40 MG tablet Take 40 mg by mouth at bedtime.      No current facility-administered medications for this visit.    Objective: Office vital signs reviewed. BP 125/80 mmHg  Pulse 80  Temp(Src) 98.1 F (36.7 C) (Oral)  Ht 6' 1.5" (1.867 m)  Wt 77.61 kg (171 lb 1.6 oz)  BMI 22.27 kg/m2  SpO2 98%   Physical Examination:  General: Awake, alert, well- nourished, NAD Cardio: Regular rate, multiple PVCs, I/VI systolic murmur  Pulm: No increased WOB.  CTAB, without wheezes, rhonchi or crackles noted. 2+ DP pulses MSK: TTP over lumbar region in the midline and in the paraspinal muscles. No drop-offs noted. Negative SLR.  5/5 strength in the UE and LE bilaterally. Normal gait and station.  Skin: Well healed anterior chest wall surgical wound.   Assessment/Plan: PVC (premature ventricular contraction) Per cardiology notes, she was having this in clinic earlier this month on EKG. PVCs sound very frequent on my exam. She intermittently notes some palpitations but does not feel it is very bothersome.  - Will defer to cardiology on whether she may require a 24hr Holter monitor again to assess the frequency and whether it could worsening cardiac function.    Low back pain Patient's pain is stable with possibly some improvement. We discussed back strengthening exercises. No red flags on history or exam. - Refilled tramadol #30 PRN pain (this is less than previously) - Refilled Flexeril  - May consider PT in the future after she is completely healed from surgery  Essential hypertension, benign Initial BP elevated, however after resting, it is at goal. During her recent hospitalization she was transitioned from coreg to metoprolol BID. - continue amlodipine and metoprolol 12.'5mg'$   BID - No ACE or ARB given h/o angioedema - Patient is now off Lasix as well s/p surgery and has no evidence of volume overload.      Archie Patten PGY-2, [place]

## 2015-02-11 ENCOUNTER — Encounter: Payer: Self-pay | Admitting: Family Medicine

## 2015-02-11 NOTE — Assessment & Plan Note (Signed)
Patient's pain is stable with possibly some improvement. We discussed back strengthening exercises. No red flags on history or exam. - Refilled tramadol #30 PRN pain (this is less than previously) - Refilled Flexeril  - May consider PT in the future after she is completely healed from surgery

## 2015-02-11 NOTE — Assessment & Plan Note (Signed)
Initial BP elevated, however after resting, it is at goal. During her recent hospitalization she was transitioned from coreg to metoprolol BID. - continue amlodipine and metoprolol 12.'5mg'$  BID - No ACE or ARB given h/o angioedema - Patient is now off Lasix as well s/p surgery and has no evidence of volume overload.

## 2015-02-11 NOTE — Assessment & Plan Note (Signed)
Per cardiology notes, she was having this in clinic earlier this month on EKG. PVCs sound very frequent on my exam. She intermittently notes some palpitations but does not feel it is very bothersome.  - Will defer to cardiology on whether she may require a 24hr Holter monitor again to assess the frequency and whether it could worsening cardiac function.

## 2015-02-13 ENCOUNTER — Ambulatory Visit (HOSPITAL_COMMUNITY): Payer: No Typology Code available for payment source

## 2015-02-15 ENCOUNTER — Ambulatory Visit (HOSPITAL_COMMUNITY): Payer: No Typology Code available for payment source

## 2015-02-20 ENCOUNTER — Ambulatory Visit (HOSPITAL_COMMUNITY): Payer: No Typology Code available for payment source

## 2015-02-22 ENCOUNTER — Ambulatory Visit (HOSPITAL_COMMUNITY): Payer: No Typology Code available for payment source

## 2015-02-24 ENCOUNTER — Ambulatory Visit (HOSPITAL_COMMUNITY): Payer: No Typology Code available for payment source

## 2015-02-27 ENCOUNTER — Ambulatory Visit (INDEPENDENT_AMBULATORY_CARE_PROVIDER_SITE_OTHER): Payer: Medicaid Other | Admitting: Cardiology

## 2015-02-27 ENCOUNTER — Encounter: Payer: Self-pay | Admitting: Cardiology

## 2015-02-27 ENCOUNTER — Ambulatory Visit (HOSPITAL_COMMUNITY): Payer: No Typology Code available for payment source

## 2015-02-27 ENCOUNTER — Ambulatory Visit (INDEPENDENT_AMBULATORY_CARE_PROVIDER_SITE_OTHER): Payer: Medicaid Other | Admitting: Pharmacist Clinician (PhC)/ Clinical Pharmacy Specialist

## 2015-02-27 VITALS — BP 106/64 | HR 80 | Ht 73.5 in | Wt 181.0 lb

## 2015-02-27 DIAGNOSIS — I493 Ventricular premature depolarization: Secondary | ICD-10-CM | POA: Diagnosis not present

## 2015-02-27 DIAGNOSIS — Z954 Presence of other heart-valve replacement: Secondary | ICD-10-CM | POA: Diagnosis not present

## 2015-02-27 DIAGNOSIS — Z9861 Coronary angioplasty status: Secondary | ICD-10-CM

## 2015-02-27 DIAGNOSIS — Z720 Tobacco use: Secondary | ICD-10-CM

## 2015-02-27 DIAGNOSIS — Z7901 Long term (current) use of anticoagulants: Secondary | ICD-10-CM

## 2015-02-27 DIAGNOSIS — I10 Essential (primary) hypertension: Secondary | ICD-10-CM

## 2015-02-27 DIAGNOSIS — E785 Hyperlipidemia, unspecified: Secondary | ICD-10-CM

## 2015-02-27 DIAGNOSIS — I351 Nonrheumatic aortic (valve) insufficiency: Secondary | ICD-10-CM | POA: Diagnosis not present

## 2015-02-27 DIAGNOSIS — Z952 Presence of prosthetic heart valve: Secondary | ICD-10-CM

## 2015-02-27 DIAGNOSIS — I2510 Atherosclerotic heart disease of native coronary artery without angina pectoris: Secondary | ICD-10-CM | POA: Diagnosis not present

## 2015-02-27 LAB — POCT INR: INR: 1.8

## 2015-02-27 NOTE — Progress Notes (Signed)
Date:  02/27/2015   ID:  Leah [name], DOB 07/18/1950, MRN [id number]  PCP:  Kathrine Cords, MD  Primary Cardiologist:  [place]  Chief Complaint  Patient presents with  . Follow-up     chest pain- in incision has a pulling sensation, occassional shortness of breath, no edema, no pain in legs, no cramping in legs, no lightheadedness, no dizziness     History of Present Illness: Leah [name] is a 64 y.o. female  64 y/o [place] American female with a history of CAD, (s/p PCI with stenting of the mid LAD in June of 2009 using a 2.5 x 12 mm Promus stent). She has a history of peripheral vascular disease. She has had a left carotid endarterectomy in August of 2009, right external iliac stenting and bilateral renal artery stenting in 2009, and left femoropopliteal bypass grafting by Dr. Kellie Simmering in 2012. In August 2013, she had chest pain. A Myoview study showed mild apical ischemia. According to medical records, she presented to the ER on 08/22/14 with chest pain. Cardiac cath done 08/24/14 revealed distal stent restenosis of 70% that was not felt to be hemodynamically significant and the plan is for medical treatment. She was seen then seen in follow up 09/07/14 and was doing well. An echo done during then showed an EF of 60-65%, with moderate to severe MR, and mild to moderate AR, and grade 2 diastolic dysfunction.   Patient was admitted from 12/05/2014 to 12/10/2014 at which time she underwent coronary artery bypass grafting 1 with a LIMA to the LAD as well as mitral valve replacement with a 29 mm Edwards magna ease pericardial tissue valve. She is on coumadin for 3 months. Overall she is doing very well. Notes she quit smoking 4 months ago. Has some soreness in upper sternum. No other chest pain or dyspnea. No palpitations or dizziness. She is scheduled for follow up CT chest 03/09/15 to follow pulmonary nodule.   Wt Readings from Last 3 Encounters:  02/27/15 82.101 kg (181 lb)    02/10/15 77.61 kg (171 lb 1.6 oz)  01/19/15 78.926 kg (174 lb)     Past Medical History  Diagnosis Date  . Hyperlipidemia   . Hypertension   . GERD (gastroesophageal reflux disease)   . Leg pain   . Headache(784.0)   . Carotid artery occlusion   . COPD (chronic obstructive pulmonary disease) ([place])   . CAD (coronary artery disease)   . Peripheral vascular disease (Leah [name])   . Tobacco abuse   . Shortness of breath dyspnea   . Renal vascular disease 10/26/2014    bilateral stents placed   . Anemia   . Severe mitral regurgitation     Current Outpatient Prescriptions  Medication Sig Dispense Refill  . acetaminophen (TYLENOL) 325 MG tablet Take 2 tablets (650 mg total) by mouth every 4 (four) hours as needed for fever, headache or mild pain.    Marland Kitchen albuterol (PROVENTIL HFA;VENTOLIN HFA) 108 (90 BASE) MCG/ACT inhaler Inhale 2 puffs into the lungs every 6 (six) hours as needed for wheezing or shortness of breath. 1 Inhaler 0  . amLODipine (NORVASC) 5 MG tablet Take 2 tablets (10 mg total) by mouth daily. 60 tablet 2  . aspirin EC 81 MG tablet Take 1 tablet (81 mg total) by mouth daily.    Marland Kitchen atorvastatin (LIPITOR) 80 MG tablet Take 1 tablet (80 mg total) by mouth daily. 30 tablet 2  . cyclobenzaprine (FLEXERIL) 10 MG tablet TAKE 1  TABLET BY MOUTH 3 TIMES DAILY AS NEEDED FOR MUSCLE SPASMS 70 tablet 0  . metoprolol tartrate (LOPRESSOR) 25 MG tablet Take 0.5 tablets (12.5 mg total) by mouth 2 (two) times daily. 30 tablet 0  . nitroGLYCERIN (NITROSTAT) 0.4 MG SL tablet Place 1 tablet (0.4 mg total) under the tongue every 5 (five) minutes x 3 doses as needed for chest pain. 25 tablet 12  . traMADol (ULTRAM) 50 MG tablet Take 1-2 tablets (50-100 mg total) by mouth every 6 (six) hours as needed (pain). 30 tablet 0  . traZODone (DESYREL) 100 MG tablet Take 1 tablet (100 mg total) by mouth at bedtime as needed for sleep. 30 tablet 2  . warfarin (COUMADIN) 2.5 MG tablet Take 1 tablet (2.5 mg total) by  mouth daily. Or as directed by the Coumadin Clinic 60 tablet 1  . [DISCONTINUED] simvastatin (ZOCOR) 40 MG tablet Take 40 mg by mouth at bedtime.      No current facility-administered medications for this visit.    Allergies:    Allergies  Allergen Reactions  . Lisinopril Swelling    Angioedema 06/10/11  . Aspirin Other (See Comments)    Upset stomach  . Chantix [Varenicline]     insomnia  . Penicillins Hives    Social History:  The patient  reports that she quit smoking about 4 months ago. Her smoking use included Cigarettes. She has a 17.5 pack-year smoking history. She has never used smokeless tobacco. She reports that she does not drink alcohol or use illicit drugs.   Family history:   Family History  Problem Relation Age of Onset  . Cancer Mother     BRAIN AND LUNG  . Hypertension Father   . Heart disease Father   . Heart attack Neg Hx   . Stroke Paternal Aunt     great aunt  . Hypertension Mother   . Hypertension Brother     ROS:  Please see the history of present illness.  All other systems reviewed and negative.   PHYSICAL EXAM: VS:  BP 106/64 mmHg  Pulse 80  Ht 6' 1.5" (1.867 m)  Wt 82.101 kg (181 lb)  BMI 23.55 kg/m2 Well nourished, well developed, in no acute distress HEENT: Pupils are equal round react to light accommodation extraocular movements are intact.  Neck: no JVDNo cervical lymphadenopathy. Cardiac:  Regularly Irregular rate and rhythm 1/6 systolic murmur at RSBSternum has healed well. Lungs:  clear to auscultation bilaterally, no wheezing, rhonchi or rales Abd: soft, nontender, positive bowel sounds all quadrants, no hepatosplenomegaly Ext: no lower extremity edema.  2+ radial and dorsalis pedis pulses. Skin: warm and dry.   She is approximately 1 cm wound in the epigastric area on the left side.   This is one of the surgical wounds.  There is no signs of infection. No erythema or discharge. It appears to be granulating in. Neuro:  Grossly  normal  EKG: 12/26/14:  Sinus rhythm with frequent  PVCs  rate 82 bpm. I have personally reviewed and interpreted this study.   ASSESSMENT AND PLAN:  Problem List Items Addressed This Visit    CAD S/P LAD DES 2009 with 70% ISR 08/24/14 - Primary (Chronic)   Essential hypertension, benign   PVC (premature ventricular contraction)   Moderate aortic regurgitation   S/P MVR (mitral valve replacement)     1. Coronary artery disease            Status post prior stent mid LAD with restenosis  Status post coronary bypass grafting 1 with a LIMA to the LAD 12/05/14.     2. Severe MR: Status post mitral valve replacement  29 mm Edwards magna ease pericardial tissue valve. Will schedule for follow up Echo. Plan to stop coumadin at 3 months and continue ASA only.  3. Hyperlipidemia    continue statin  4. Peripheral vascular disease s/p multiple procedures as noted.    no particular complaints at this time. Long-term use anticoagulate  5. Essential hypertension   blood pressure is well controlled.    6. Tobacco abuse. Now quit. Encourage continued abstinence.   7. PVCs. Asymptomatic. Continue metoprolol.  8. Lung nodule. Follow up CT

## 2015-02-27 NOTE — Patient Instructions (Signed)
We will schedule you for an Echocardiogram  Congratulations on quitting smoking  Exercise more with walking.  I will see you in 4 months.

## 2015-03-01 ENCOUNTER — Ambulatory Visit (HOSPITAL_COMMUNITY): Payer: No Typology Code available for payment source

## 2015-03-03 ENCOUNTER — Ambulatory Visit (HOSPITAL_COMMUNITY): Payer: No Typology Code available for payment source

## 2015-03-06 ENCOUNTER — Ambulatory Visit (HOSPITAL_COMMUNITY): Payer: No Typology Code available for payment source

## 2015-03-08 ENCOUNTER — Ambulatory Visit (HOSPITAL_COMMUNITY): Payer: No Typology Code available for payment source

## 2015-03-09 ENCOUNTER — Ambulatory Visit: Payer: Medicaid Other | Admitting: Cardiothoracic Surgery

## 2015-03-09 ENCOUNTER — Ambulatory Visit
Admission: RE | Admit: 2015-03-09 | Discharge: 2015-03-09 | Disposition: A | Discharge status: Home / Self Care | Payer: No Typology Code available for payment source | Source: Ambulatory Visit | Attending: Cardiothoracic Surgery | Admitting: Cardiothoracic Surgery

## 2015-03-09 DIAGNOSIS — R911 Solitary pulmonary nodule: Secondary | ICD-10-CM

## 2015-03-10 ENCOUNTER — Ambulatory Visit (HOSPITAL_COMMUNITY): Payer: No Typology Code available for payment source

## 2015-03-10 ENCOUNTER — Ambulatory Visit (HOSPITAL_COMMUNITY): Payer: No Typology Code available for payment source | Attending: Cardiology

## 2015-03-10 ENCOUNTER — Encounter: Payer: Self-pay | Admitting: Cardiothoracic Surgery

## 2015-03-10 ENCOUNTER — Other Ambulatory Visit: Payer: Self-pay

## 2015-03-10 ENCOUNTER — Ambulatory Visit (INDEPENDENT_AMBULATORY_CARE_PROVIDER_SITE_OTHER): Payer: No Typology Code available for payment source | Admitting: Cardiothoracic Surgery

## 2015-03-10 VITALS — BP 148/90 | HR 60 | Resp 20 | Ht 73.5 in | Wt 189.0 lb

## 2015-03-10 DIAGNOSIS — R911 Solitary pulmonary nodule: Secondary | ICD-10-CM

## 2015-03-10 DIAGNOSIS — Z951 Presence of aortocoronary bypass graft: Secondary | ICD-10-CM

## 2015-03-10 DIAGNOSIS — I25110 Atherosclerotic heart disease of native coronary artery with unstable angina pectoris: Secondary | ICD-10-CM

## 2015-03-10 DIAGNOSIS — I517 Cardiomegaly: Secondary | ICD-10-CM | POA: Insufficient documentation

## 2015-03-10 DIAGNOSIS — Z954 Presence of other heart-valve replacement: Secondary | ICD-10-CM

## 2015-03-10 DIAGNOSIS — I071 Rheumatic tricuspid insufficiency: Secondary | ICD-10-CM | POA: Insufficient documentation

## 2015-03-10 DIAGNOSIS — I351 Nonrheumatic aortic (valve) insufficiency: Secondary | ICD-10-CM

## 2015-03-10 DIAGNOSIS — Z9861 Coronary angioplasty status: Secondary | ICD-10-CM

## 2015-03-10 DIAGNOSIS — I10 Essential (primary) hypertension: Secondary | ICD-10-CM

## 2015-03-10 DIAGNOSIS — Z87891 Personal history of nicotine dependence: Secondary | ICD-10-CM | POA: Insufficient documentation

## 2015-03-10 DIAGNOSIS — Z952 Presence of prosthetic heart valve: Secondary | ICD-10-CM

## 2015-03-10 DIAGNOSIS — J449 Chronic obstructive pulmonary disease, unspecified: Secondary | ICD-10-CM | POA: Insufficient documentation

## 2015-03-10 DIAGNOSIS — I340 Nonrheumatic mitral (valve) insufficiency: Secondary | ICD-10-CM

## 2015-03-10 DIAGNOSIS — E785 Hyperlipidemia, unspecified: Secondary | ICD-10-CM | POA: Insufficient documentation

## 2015-03-10 DIAGNOSIS — I2510 Atherosclerotic heart disease of native coronary artery without angina pectoris: Secondary | ICD-10-CM

## 2015-03-10 DIAGNOSIS — I493 Ventricular premature depolarization: Secondary | ICD-10-CM

## 2015-03-13 ENCOUNTER — Ambulatory Visit (HOSPITAL_COMMUNITY): Payer: No Typology Code available for payment source

## 2015-03-13 NOTE — Progress Notes (Signed)
[address]       [place] [address]             [phone]      Solenne F Beeghly Luke Medical Record #[id number] Date of Birth: Jul 24, 1950  Referring: Martinique, Peter M, MD Primary Care: Kathrine Cords, MD  Chief Complaint:   POST OP FOLLOW UP 12/05/2014  OPERATIVE REPORT PREOPERATIVE DIAGNOSES: 1. Severe mitral insufficiency. 2. Aortic insufficiency. 3. Coronary occlusive disease. POSTOPERATIVE DIAGNOSES: 1. Severe mitral insufficiency. 2. Aortic insufficiency. 3. Coronary occlusive disease. 4. Aortic insufficiency, mild. PROCEDURE PERFORMED: Mitral valve replacement with [facility] pericardial tissue valve, model 7300TFX 29 mm, serial #[id number] and coronary artery bypass grafting x1 with the left internal mammary to the left anterior descending coronary artery. Closure of left atrial appendage. SURGEON: Lanelle Bal, MD.  History of Present Illness:      Patient doing well postoperatively. She's been able to increase her physical activity without shortness of breath and fatigue that she been experiencing during the summer. She has been followed by cardiology for  her elevated BP. Now 3 months post op with a tissue mitral valve and sinus , cardiology has stopped coumadin.  Past Medical History  Diagnosis Date  . Hyperlipidemia   . Hypertension   . GERD (gastroesophageal reflux disease)   . Leg pain   . Headache(784.0)   . Carotid artery occlusion   . COPD (chronic obstructive pulmonary disease) ([place])   . CAD (coronary artery disease)   . Peripheral vascular disease ([place])   . Tobacco abuse   . Shortness of breath dyspnea   . Renal vascular disease 10/26/2014    bilateral stents placed   . Anemia   . Severe mitral regurgitation      History  Smoking status  . Former Smoker -- 0.50 packs/day for 35 years  . Types: Cigarettes  . Quit date: 10/31/2014  Smokeless tobacco  . Never Used    Comment: has not started  wellbutrin yet    History  Alcohol Use No     Allergies  Allergen Reactions  . Lisinopril Swelling    Angioedema 06/10/11  . Aspirin Other (See Comments)    Upset stomach  . Chantix [Varenicline]     insomnia  . Penicillins Hives    Current Outpatient Prescriptions  Medication Sig Dispense Refill  . acetaminophen (TYLENOL) 325 MG tablet Take 2 tablets (650 mg total) by mouth every 4 (four) hours as needed for fever, headache or mild pain.    Marland Kitchen albuterol (PROVENTIL HFA;VENTOLIN HFA) 108 (90 BASE) MCG/ACT inhaler Inhale 2 puffs into the lungs every 6 (six) hours as needed for wheezing or shortness of breath. 1 Inhaler 0  . amLODipine (NORVASC) 5 MG tablet Take 2 tablets (10 mg total) by mouth daily. 60 tablet 2  . aspirin EC 81 MG tablet Take 1 tablet (81 mg total) by mouth daily.    Marland Kitchen atorvastatin (LIPITOR) 80 MG tablet Take 1 tablet (80 mg total) by mouth daily. 30 tablet 2  . cyclobenzaprine (FLEXERIL) 10 MG tablet TAKE 1 TABLET BY MOUTH 3 TIMES DAILY AS NEEDED FOR MUSCLE SPASMS 70 tablet 0  . metoprolol tartrate (LOPRESSOR) 25 MG tablet Take 0.5 tablets (12.5 mg total) by mouth 2 (two) times daily. 30 tablet 0  . nitroGLYCERIN (NITROSTAT) 0.4 MG SL tablet Place 1 tablet (0.4 mg total) under the tongue every 5 (five) minutes x 3 doses as needed for chest pain. 25  tablet 12  . traMADol (ULTRAM) 50 MG tablet Take 1-2 tablets (50-100 mg total) by mouth every 6 (six) hours as needed (pain). 30 tablet 0  . traZODone (DESYREL) 100 MG tablet Take 1 tablet (100 mg total) by mouth at bedtime as needed for sleep. 30 tablet 2  . warfarin (COUMADIN) 2.5 MG tablet Take 1 tablet (2.5 mg total) by mouth daily. Or as directed by the [facility] 60 tablet 1  . [DISCONTINUED] simvastatin (ZOCOR) 40 MG tablet Take 40 mg by mouth at bedtime.      No current facility-administered medications for this visit.       Physical Exam: BP 148/90 mmHg  Pulse 60  Resp 20  Ht 6' 1.5" (1.867 m)  Wt  189 lb (85.73 kg)  BMI 24.59 kg/m2  SpO2 98%  General appearance: alert and cooperative Neurologic: intact Heart: regular rate and rhythm, S1, S2 normal, no murmur, click, rub or gallop Lungs: clear to auscultation bilaterally Abdomen: soft, non-tender; bowel sounds normal; no masses,  no organomegaly Extremities: extremities normal, atraumatic, no cyanosis or edema and Homans sign is negative, no sign of DVT Wound: very smll escar at left chest tube site, treated with silver nitrate No murmur of mitral insufficiency  Diagnostic Studies & Laboratory data:     Recent Radiology Findings:   Ct Chest Wo Contrast  03/09/2015  CLINICAL DATA:  Followup pulmonary nodule. EXAM: CT CHEST WITHOUT CONTRAST TECHNIQUE: Multidetector CT imaging of the chest was performed following the standard protocol without IV contrast. COMPARISON:  Chest CT 10/27/2014 and 08/25/2014 FINDINGS: Mediastinum/Nodes: No breast masses, supraclavicular or axillary lymphadenopathy. The thyroid gland is grossly normal. The heart is normal in size. No pericardial effusion. Stable tortuosity, ectasia and calcifications involving the thoracic aorta. Prosthetic aortic valve is noted along with coronary artery bypass grafts. No mediastinal or hilar mass or adenopathy. The esophagus is grossly normal. Lungs/Pleura: The lungs are clear of acute process. Stable emphysematous changes. Again demonstrated is a right upper lobe pulmonary lesion which is partly solid and partly cystic. The solid component measures slightly larger on the coronal images. It measures 7.5 mm on image number 53 of series 601. It previously measured 6 mm. The cystic component has a thicker ram than on the prior examinations. The largest measurement on the axial images of 11.5 mm and was previously 10.5 mm. Could not exclude a slow growing neoplasm. PET-CT may be helpful for further evaluation. It would not be an reasonable to continue to follow this with CT with a  repeat CT scan in 4-6 months. No new regions.  No acute pulmonary findings.  No pleural effusion. Upper abdomen: No significant upper abdominal findings. Musculoskeletal: No significant bony findings. IMPRESSION: Slight interval increase in size of the right apical lung lesion as described above. Continued CT follow-up (4-6 months) or PET-CT are both reasonable. No new pulmonary lesions or acute pulmonary findings. Stable emphysematous changes. No mediastinal or hilar mass or adenopathy. Electronically Signed   By: Marijo Sanes M.D.   On: 03/09/2015 13:30   I have independently reviewed the above radiology studies  and reviewed the findings with the patient.    CLINICAL DATA: Re-evaluate right upper lobe pulmonary nodule, prior to possible valve surgery. Initial encounter.  EXAM: CT CHEST WITHOUT CONTRAST  TECHNIQUE: Multidetector CT imaging of the chest was performed following the standard protocol without IV contrast.  COMPARISON: CT of the chest performed 08/25/2014  FINDINGS: The small slightly spiculated nodule at the right  lung apex is again seen; there is a small associated cystic component which appears to have increased mildly in size, while the solid component has decreased slightly in size, measuring approximately 5 mm. Given the slight apparent interval decrease in size of the solid component from the recent prior study, this is thought more likely to be postinfectious in nature, though malignancy cannot be entirely excluded.  No additional pulmonary nodules are seen. The lungs are otherwise clear, aside from mild scarring or atelectasis at the left lung base. No pleural effusion or pneumothorax is seen.  Scattered coronary artery calcifications are noted. The mediastinum is otherwise unremarkable. No mediastinal lymphadenopathy is seen. No pericardial effusion is identified. The great vessels are grossly unremarkable, aside from scattered calcification. Mild  calcification is noted along the thoracic aorta. The visualized portions of the thyroid gland are unremarkable. No axillary lymphadenopathy is appreciated.  No acute osseous abnormalities are identified.  IMPRESSION: 1. Small slightly spiculated nodule at the right lung apex is again seen. It has a small cystic component which appears to have increased mildly in size, while the solid component has decreased slightly in size, measuring approximately 5 mm. Given the slight apparent interval decrease in size of the solid component from the recent prior CT, this is thought more likely to be postinfectious in nature, though malignancy cannot be entirely excluded. As this remains too small to further characterize, would recommend strict follow-up CT of the chest in 3-6 months, to ensure stability. 2. Mild scarring or atelectasis at the left lung base. 3. Scattered coronary artery calcifications seen.   Electronically Signed  By: Garald Balding M.D.  On: 10/27/2014 19:40  Recent Lab Findings: Lab Results  Component Value Date   WBC 12.1* 12/09/2014   HGB 8.5* 12/09/2014   HCT 25.9* 12/09/2014   PLT 88* 12/09/2014   GLUCOSE 88 12/09/2014   CHOL 181 08/24/2014   TRIG 88 08/24/2014   HDL 33* 08/24/2014   LDLDIRECT 185* 09/09/2011   LDLCALC 130* 08/24/2014   ALT 12* 11/28/2014   AST 16 11/28/2014   NA 136 12/09/2014   K 3.4* 12/09/2014   CL 101 12/09/2014   CREATININE 0.99 12/09/2014   BUN 8 12/09/2014   CO2 25 12/09/2014   TSH 1.756 11/28/2014   INR 1.8 02/27/2015   HGBA1C 6.3* 12/02/2014   ECHO: 03/10/2015 Study Conclusions  - Left ventricle: The cavity size was normal. Wall thickness was normal. Systolic function was mildly reduced. The estimated ejection fraction was in the range of 45% to 50%. There is hypokinesis of the basal-mid septal myocardium. - Aortic valve: There was moderate regurgitation. - Mitral valve: A bioprosthesis was present. - Left  atrium: The atrium was mildly dilated.  Impressions:  - Basal septal hypokinesis with mildly reduced LV function; aortic valve not well visualized but probable moderate AI (2+); s/p MVR with no obvious MR; trace TR.  Transthoracic echocardiography. M-mode, complete 2D, spectral Doppler, and color Doppler. Birthdate: Patient birthdate: February 11, 1951. Age: Patient is 64 yr old. Sex: Gender: female. BMI: 23.9 kg/m^2. Blood pressure:   106/64 Patient status: Outpatient. Study date: Study date: 03/10/2015. Study time: 02:26 PM. Location: [place] [facility]  -------------------------------------------------------------------  ------------------------------------------------------------------- Left ventricle: The cavity size was normal. Wall thickness was normal. Systolic function was mildly reduced. The estimated ejection fraction was in the range of 45% to 50%. Regional wall motion abnormalities: There is hypokinesis of the basal-mid septal myocardium.  ------------------------------------------------------------------- Aortic valve: Poorly visualized. Doppler: Transvalvular velocity was within  the normal range. There was no stenosis. There was moderate regurgitation.  ------------------------------------------------------------------- Aorta: Aortic root: The aortic root was normal in size.  ------------------------------------------------------------------- Mitral valve: A bioprosthesis was present. Doppler: There was no regurgitation.  Peak gradient (D): 8 mm Hg.  ------------------------------------------------------------------- Left atrium: The atrium was mildly dilated.  ------------------------------------------------------------------- Right ventricle: The cavity size was normal. Systolic function was normal.  ------------------------------------------------------------------- Pulmonic valve:  Doppler: Transvalvular velocity was within  the normal range. There was no evidence for stenosis.  ------------------------------------------------------------------- Tricuspid valve:  Structurally normal valve.  Doppler: Transvalvular velocity was within the normal range. There was trivial regurgitation.  ------------------------------------------------------------------- Pulmonary artery:  Systolic pressure was within the normal range.  ------------------------------------------------------------------- Right atrium: The atrium was normal in size.  ------------------------------------------------------------------- Pericardium: There was no pericardial effusion.  ------------------------------------------------------------------- Systemic veins: Inferior vena cava: The vessel was normal in size.   Assessment / Plan: In June 2016 she was noted to have a very small right upper lobe lung nodule preoperatively a repeat scan was done in August, that showed the solid component to decrease in size. Repeat ct of chest reviewed with patient , the waxing and waning cystic nodule in right upper lobe is noted, still small size for discrimination via PET scan. I have recommended to the patient we get repeat ct chest with super d protocol to evaluate nodule and consider navigation bronchoscopy for biopsy  in 3 months if  continued increase in size    Doing well following coronary artery bypass grafting and mitral valve replacement for rheumatic valvular disease Coumadin has been stopped by Cardiology, 3 months post op with tissue mitral  valve and sinus rhythm  She continues to be followed by  Dr. Doug Sou office.   [name] MD      [address] [place] [address] Office [phone]   Beeper [phone]  03/13/2015 11:52 AM

## 2015-03-15 ENCOUNTER — Ambulatory Visit (INDEPENDENT_AMBULATORY_CARE_PROVIDER_SITE_OTHER): Payer: No Typology Code available for payment source | Admitting: Family Medicine

## 2015-03-15 ENCOUNTER — Ambulatory Visit (HOSPITAL_COMMUNITY): Payer: No Typology Code available for payment source

## 2015-03-15 ENCOUNTER — Encounter: Payer: Self-pay | Admitting: Family Medicine

## 2015-03-15 VITALS — BP 142/68 | HR 68 | Temp 97.8°F | Ht 74.0 in | Wt 180.2 lb

## 2015-03-15 DIAGNOSIS — I10 Essential (primary) hypertension: Secondary | ICD-10-CM

## 2015-03-15 DIAGNOSIS — M545 Low back pain, unspecified: Secondary | ICD-10-CM

## 2015-03-15 DIAGNOSIS — R911 Solitary pulmonary nodule: Secondary | ICD-10-CM

## 2015-03-15 MED ORDER — TRAMADOL HCL 50 MG PO TABS: 50.0000 mg | ORAL_TABLET | Freq: Four times a day (QID) | ORAL | Status: DC | PRN

## 2015-03-15 MED ORDER — CYCLOBENZAPRINE HCL 10 MG PO TABS: ORAL_TABLET | ORAL | Status: DC

## 2015-03-15 NOTE — Progress Notes (Signed)
Patient ID: Leah [name], female   DOB: 02-05-1951, 64 y.o.   MRN: [id number]    Subjective: CC:  Pain f/u HPI: Patient is a 64 y.o. female with a past medical history of rheumatic disease, HTN, CAD presenting to clinic today for a pain follow up. She is s/p MVR with pericardial tissue valve and CABG x 1 with L IMA to the LAD on 9/12.   Since her MVR, her coumadin has been discontinued. She is status post stenting of the mid LAD in June of 2009 with a 2.5 x 12 mm Promus stent. She was on Plavix but this was discontinued when she was started on Warfarin. I spoke with Dr. Martinique, her cardiologist, who stated that she would be ok with ASA only.   Hypertension Blood pressure at home: unsure Blood pressure today: 142/68 Meds: Compliant with amlodipine and metoprolol Side effects: None ROS: Denies headache, dizziness, visual changes, nausea, vomiting, chest pain, abdominal pain or shortness of breath.  Lumbar pain Pain pretty well controlled currently with tramadol. Pain is midline and in the paraspinal muscles bilaterally. It feels pulling and tight, sometimes throbbing. She feels the pain is most severe on when sitting or standing too long. Blames her poor posture for some of it. States she still not as active as she should be, notes CVTS has told her she should try to become more active now that she has had the procedure.  Patient denies radicular pain. No urinary or bowel incontinence. No saddle paresthesias.   Social History: continues to be smoke free   Health Maintenance: CT chest 02/2015: slight increase in R lung apex spiculated nodule, f/u CT chest in 4-6 months (06/2015)  ROS: All other systems reviewed and are negative.  Past Medical History Patient Active Problem List   Diagnosis Date Noted  . Weakness   . Long-term (current) use of anticoagulants 12/14/2014  . S/P MVR (mitral valve replacement) 12/05/2014  . Moderate aortic regurgitation 10/26/2014  . Renal vascular  disease 10/26/2014  . Acute on chronic diastolic congestive heart failure ([place]) 10/21/2014  . Tobacco use 10/21/2014  . Troponin level elevated-(felt to be from CHF) 10/21/2014  . Diastolic dysfunction, grade 2 by echo June 2016 10/21/2014  . Carpal tunnel syndrome 10/17/2014  . Left shoulder pain 09/17/2014  . Pulmonary nodule, right-needs repeat CT in Dec 2016 09/17/2014  . Abnormal CXR 08/25/2014  . Severe mitral regurgitation 08/25/2014  . Unstable angina ([place]) 08/22/2014  . Hematuria 07/01/2014  . Low back pain 07/05/2012  . Insomnia 07/05/2012  . PVC (premature ventricular contraction) 10/02/2011  . Fatigue 09/04/2011  . Chest pain 09/04/2011  . History of angioedema with ACE 2013 06/14/2011  . PVD- s/p multiple proceedures 06/04/2011  . Hyperlipidemia 09/28/2008  . Essential hypertension, benign 09/28/2008  . CAD S/P LAD DES 2009 with 70% ISR 08/24/14 09/28/2008     Medications- reviewed and updated Current Outpatient Prescriptions  Medication Sig Dispense Refill  . acetaminophen (TYLENOL) 325 MG tablet Take 2 tablets (650 mg total) by mouth every 4 (four) hours as needed for fever, headache or mild pain.    Marland Kitchen albuterol (PROVENTIL HFA;VENTOLIN HFA) 108 (90 BASE) MCG/ACT inhaler Inhale 2 puffs into the lungs every 6 (six) hours as needed for wheezing or shortness of breath. 1 Inhaler 0  . amLODipine (NORVASC) 5 MG tablet Take 2 tablets (10 mg total) by mouth daily. 60 tablet 2  . aspirin EC 81 MG tablet Take 1 tablet (81 mg total) by  mouth daily.    Marland Kitchen atorvastatin (LIPITOR) 80 MG tablet Take 1 tablet (80 mg total) by mouth daily. 30 tablet 2  . cyclobenzaprine (FLEXERIL) 10 MG tablet TAKE 1 TABLET BY MOUTH 3 TIMES DAILY AS NEEDED FOR MUSCLE SPASMS 70 tablet 0  . metoprolol tartrate (LOPRESSOR) 25 MG tablet Take 0.5 tablets (12.5 mg total) by mouth 2 (two) times daily. 30 tablet 0  . nitroGLYCERIN (NITROSTAT) 0.4 MG SL tablet Place 1 tablet (0.4 mg total) under the tongue every 5  (five) minutes x 3 doses as needed for chest pain. 25 tablet 12  . traMADol (ULTRAM) 50 MG tablet Take 1-2 tablets (50-100 mg total) by mouth every 6 (six) hours as needed (pain). 30 tablet 0  . traZODone (DESYREL) 100 MG tablet Take 1 tablet (100 mg total) by mouth at bedtime as needed for sleep. 30 tablet 2  . [DISCONTINUED] simvastatin (ZOCOR) 40 MG tablet Take 40 mg by mouth at bedtime.      No current facility-administered medications for this visit.    Objective: Office vital signs reviewed. BP 142/68 mmHg  Pulse 68  Temp(Src) 97.8 F (36.6 C) (Oral)  Ht '6\' 2"'$  (1.88 m)  Wt 180 lb 3.2 oz (81.738 kg)  BMI 23.13 kg/m2  SpO2 98%   Physical Examination:  General: Awake, alert, well- nourished, NAD Cardio: Regular rate and rhythm. I/VI systolic murmur  Pulm: No increased WOB.  CTAB, without wheezes, rhonchi or crackles noted. 2+ DP pulses MSK: Does appear to slouch. TTP over lumbar region in the midline and in the paraspinal muscles. No drop-offs noted. Negative SLR. 5/5 strength in the UE and LE bilaterally. Normal gait and station.  Skin: Well healed anterior chest wall surgical wound.   Assessment/Plan: Low back pain Pain stable without red flags on exam.  - refilled tramadol #30 PRN pain - urged pt to become more active, while I would love for her to start PT, I am unsure whether insurance would cover it.   Pulmonary nodule, right-needs repeat CT in Dec 2016 Nodule with slight increase in size from 6/16. Will repeat CT in 4 months. Per CVTS if continuing to change size, would consider bronchoscopy with biopsy given the location. Patient is a recent former smoker. No respiratory issues currently.   Essential hypertension, benign BP stable on repeat. - continue amlodipine and metoprolol. - no ACE and ARB given h/o angioedema     Archie Patten PGY-2, [place]

## 2015-03-15 NOTE — Patient Instructions (Signed)
It was good to see you again. Your blood pressure is doing better. Continue your current regimen. I will contact you if the cardiologist wants you to take the Plavix again. Have a great holiday!

## 2015-03-16 ENCOUNTER — Encounter: Payer: Self-pay | Admitting: Family Medicine

## 2015-03-16 NOTE — Assessment & Plan Note (Signed)
Pain stable without red flags on exam.  - refilled tramadol #30 PRN pain - urged pt to become more active, while I would love for her to start PT, I am unsure whether insurance would cover it.

## 2015-03-16 NOTE — Assessment & Plan Note (Signed)
BP stable on repeat. - continue amlodipine and metoprolol. - no ACE and ARB given h/o angioedema

## 2015-03-16 NOTE — Assessment & Plan Note (Signed)
Nodule with slight increase in size from 6/16. Will repeat CT in 4 months. Per CVTS if continuing to change size, would consider bronchoscopy with biopsy given the location. Patient is a recent former smoker. No respiratory issues currently.

## 2015-03-17 ENCOUNTER — Ambulatory Visit (HOSPITAL_COMMUNITY): Payer: No Typology Code available for payment source

## 2015-03-22 ENCOUNTER — Ambulatory Visit (HOSPITAL_COMMUNITY): Payer: No Typology Code available for payment source

## 2015-03-23 ENCOUNTER — Other Ambulatory Visit: Payer: Self-pay | Admitting: *Deleted

## 2015-03-24 ENCOUNTER — Ambulatory Visit (HOSPITAL_COMMUNITY): Payer: No Typology Code available for payment source

## 2015-03-29 ENCOUNTER — Ambulatory Visit (HOSPITAL_COMMUNITY): Payer: No Typology Code available for payment source

## 2015-03-31 ENCOUNTER — Ambulatory Visit (HOSPITAL_COMMUNITY): Payer: No Typology Code available for payment source

## 2015-04-03 ENCOUNTER — Ambulatory Visit (HOSPITAL_COMMUNITY): Payer: No Typology Code available for payment source

## 2015-04-05 ENCOUNTER — Ambulatory Visit (HOSPITAL_COMMUNITY): Payer: No Typology Code available for payment source

## 2015-04-07 ENCOUNTER — Ambulatory Visit (HOSPITAL_COMMUNITY): Payer: No Typology Code available for payment source

## 2015-04-10 ENCOUNTER — Ambulatory Visit (HOSPITAL_COMMUNITY): Payer: No Typology Code available for payment source

## 2015-04-10 DIAGNOSIS — Z736 Limitation of activities due to disability: Secondary | ICD-10-CM

## 2015-04-12 ENCOUNTER — Ambulatory Visit (HOSPITAL_COMMUNITY): Payer: No Typology Code available for payment source

## 2015-04-12 ENCOUNTER — Other Ambulatory Visit: Payer: Self-pay | Admitting: Family Medicine

## 2015-04-12 DIAGNOSIS — M545 Low back pain, unspecified: Secondary | ICD-10-CM

## 2015-04-12 MED ORDER — TRAMADOL HCL 50 MG PO TABS: 50.0000 mg | ORAL_TABLET | Freq: Four times a day (QID) | ORAL | Status: DC | PRN

## 2015-04-12 MED ORDER — CYCLOBENZAPRINE HCL 10 MG PO TABS: ORAL_TABLET | ORAL | Status: DC

## 2015-04-12 NOTE — Telephone Encounter (Signed)
Tramadol and Flexeril refilled and at the front office. Please let pt know.  Thanks, Archie Patten, MD [facility] Resident  04/12/2015, 3:57 PM

## 2015-04-12 NOTE — Telephone Encounter (Signed)
Pt called and needs a refill on her Tramadol and Flexeril . Please let patient know when ready to pick up. jw

## 2015-04-13 NOTE — Telephone Encounter (Signed)
Pt informed. Leah [name], CMA  

## 2015-04-14 ENCOUNTER — Ambulatory Visit (HOSPITAL_COMMUNITY): Payer: No Typology Code available for payment source

## 2015-05-04 ENCOUNTER — Ambulatory Visit (HOSPITAL_COMMUNITY): Payer: No Typology Code available for payment source

## 2015-05-08 ENCOUNTER — Ambulatory Visit (HOSPITAL_COMMUNITY): Payer: No Typology Code available for payment source

## 2015-05-10 ENCOUNTER — Ambulatory Visit (HOSPITAL_COMMUNITY): Payer: No Typology Code available for payment source

## 2015-05-12 ENCOUNTER — Ambulatory Visit (HOSPITAL_COMMUNITY): Payer: No Typology Code available for payment source

## 2015-05-15 ENCOUNTER — Ambulatory Visit (HOSPITAL_COMMUNITY): Payer: No Typology Code available for payment source

## 2015-05-17 ENCOUNTER — Ambulatory Visit (HOSPITAL_COMMUNITY): Payer: No Typology Code available for payment source

## 2015-05-18 ENCOUNTER — Other Ambulatory Visit: Payer: Self-pay | Admitting: *Deleted

## 2015-05-18 DIAGNOSIS — R911 Solitary pulmonary nodule: Secondary | ICD-10-CM

## 2015-05-19 ENCOUNTER — Ambulatory Visit (HOSPITAL_COMMUNITY): Payer: No Typology Code available for payment source

## 2015-05-22 ENCOUNTER — Telehealth: Payer: Self-pay | Admitting: Family Medicine

## 2015-05-22 ENCOUNTER — Ambulatory Visit (HOSPITAL_COMMUNITY): Payer: No Typology Code available for payment source

## 2015-05-22 DIAGNOSIS — M545 Low back pain, unspecified: Secondary | ICD-10-CM

## 2015-05-22 NOTE — Telephone Encounter (Signed)
Needs refill tramadol and flexeril.

## 2015-05-24 ENCOUNTER — Ambulatory Visit (HOSPITAL_COMMUNITY): Payer: No Typology Code available for payment source

## 2015-05-24 ENCOUNTER — Other Ambulatory Visit: Payer: Self-pay | Admitting: Family Medicine

## 2015-05-26 ENCOUNTER — Ambulatory Visit (HOSPITAL_COMMUNITY): Payer: No Typology Code available for payment source

## 2015-05-26 MED ORDER — CYCLOBENZAPRINE HCL 10 MG PO TABS: ORAL_TABLET | ORAL | Status: DC

## 2015-05-26 MED ORDER — TRAMADOL HCL 50 MG PO TABS: 50.0000 mg | ORAL_TABLET | Freq: Four times a day (QID) | ORAL | Status: DC | PRN

## 2015-05-26 NOTE — Telephone Encounter (Signed)
Tramadol and flexeril refilled. At the front desk. Please let the patient know that tramadol is a controlled substance. As such, she must come to appts in the future to get these-- it is not good practice to fill these medications without seeing the patient.  Thanks, Archie Patten, MD [facility] Resident  05/26/2015, 1:46 PM

## 2015-05-29 ENCOUNTER — Ambulatory Visit (HOSPITAL_COMMUNITY): Payer: No Typology Code available for payment source

## 2015-05-31 ENCOUNTER — Ambulatory Visit (HOSPITAL_COMMUNITY): Payer: No Typology Code available for payment source

## 2015-06-02 ENCOUNTER — Ambulatory Visit (HOSPITAL_COMMUNITY): Payer: No Typology Code available for payment source

## 2015-06-05 ENCOUNTER — Ambulatory Visit (HOSPITAL_COMMUNITY): Payer: No Typology Code available for payment source

## 2015-06-07 ENCOUNTER — Ambulatory Visit (HOSPITAL_COMMUNITY): Payer: No Typology Code available for payment source

## 2015-06-08 ENCOUNTER — Ambulatory Visit
Admission: RE | Admit: 2015-06-08 | Discharge: 2015-06-08 | Disposition: A | Discharge status: Home / Self Care | Payer: Medicaid Other | Source: Ambulatory Visit | Attending: Cardiothoracic Surgery | Admitting: Cardiothoracic Surgery

## 2015-06-08 ENCOUNTER — Encounter: Payer: Self-pay | Admitting: Cardiothoracic Surgery

## 2015-06-08 ENCOUNTER — Ambulatory Visit (INDEPENDENT_AMBULATORY_CARE_PROVIDER_SITE_OTHER): Payer: Medicaid Other | Admitting: Cardiothoracic Surgery

## 2015-06-08 VITALS — BP 140/82 | HR 85 | Resp 20 | Ht 74.0 in | Wt 180.0 lb

## 2015-06-08 DIAGNOSIS — Z951 Presence of aortocoronary bypass graft: Secondary | ICD-10-CM

## 2015-06-08 DIAGNOSIS — Z952 Presence of prosthetic heart valve: Secondary | ICD-10-CM

## 2015-06-08 DIAGNOSIS — Z954 Presence of other heart-valve replacement: Secondary | ICD-10-CM | POA: Diagnosis not present

## 2015-06-08 DIAGNOSIS — R911 Solitary pulmonary nodule: Secondary | ICD-10-CM

## 2015-06-08 NOTE — Progress Notes (Signed)
[address]       [place] [address]             [phone]      Keiran F Rowen Taylor Medical Record #[id number] Date of Birth: [birth year]  Referring: [place], Peter M, MD Primary Care: Kathrine Cords, MD  Chief Complaint:   POST OP FOLLOW UP 12/05/2014  OPERATIVE REPORT PREOPERATIVE DIAGNOSES: 1. Severe mitral insufficiency. 2. Aortic insufficiency. 3. Coronary occlusive disease. POSTOPERATIVE DIAGNOSES: 1. Severe mitral insufficiency. 2. Aortic insufficiency. 3. Coronary occlusive disease. 4. Aortic insufficiency, mild. PROCEDURE PERFORMED: Mitral valve replacement with [facility] pericardial tissue valve, model 7300TFX 29 mm, serial #[id number] and coronary artery bypass grafting x1 with the left internal mammary to the left anterior descending coronary artery. Closure of left atrial appendage. SURGEON: Lanelle Bal, MD.  History of Present Illness:      Patient doing well postoperatively. She's been able to increase her physical activity without shortness of breath and fatigue that she been experiencing during the summer. She has been followed by cardiology for  her elevated BP. Now 3 months post op with a tissue mitral valve and sinus , cardiology has stopped coumadin.  Past Medical History  Diagnosis Date  . Hyperlipidemia   . Hypertension   . GERD (gastroesophageal reflux disease)   . Leg pain   . Headache(784.0)   . Carotid artery occlusion   . COPD (chronic obstructive pulmonary disease) ([place])   . CAD (coronary artery disease)   . Peripheral vascular disease ([place])   . Tobacco abuse   . Shortness of breath dyspnea   . Renal vascular disease 10/26/2014    bilateral stents placed   . Anemia   . Severe mitral regurgitation      History  Smoking status  . Former Smoker -- 0.50 packs/day for 35 years  . Types: Cigarettes  . Quit date: 10/31/2014  Smokeless tobacco  . Never Used    Comment: has not started  wellbutrin yet    History  Alcohol Use No     Allergies  Allergen Reactions  . Lisinopril Swelling    Angioedema 06/10/11  . Aspirin Other (See Comments)    Upset stomach  . Chantix [Varenicline]     insomnia  . Penicillins Hives    Current Outpatient Prescriptions  Medication Sig Dispense Refill  . acetaminophen (TYLENOL) 325 MG tablet Take 2 tablets (650 mg total) by mouth every 4 (four) hours as needed for fever, headache or mild pain.    Marland Kitchen albuterol (PROVENTIL HFA;VENTOLIN HFA) 108 (90 BASE) MCG/ACT inhaler Inhale 2 puffs into the lungs every 6 (six) hours as needed for wheezing or shortness of breath. 1 Inhaler 0  . amLODipine (NORVASC) 5 MG tablet TAKE TWO TABLETS BY MOUTH ONCE DAILY 60 tablet 0  . aspirin EC 81 MG tablet Take 1 tablet (81 mg total) by mouth daily.    Marland Kitchen atorvastatin (LIPITOR) 80 MG tablet Take 1 tablet (80 mg total) by mouth daily. 30 tablet 2  . cyclobenzaprine (FLEXERIL) 10 MG tablet TAKE 1 TABLET BY MOUTH 3 TIMES DAILY AS NEEDED FOR MUSCLE SPASMS 70 tablet 0  . metoprolol tartrate (LOPRESSOR) 25 MG tablet Take 0.5 tablets (12.5 mg total) by mouth 2 (two) times daily. 30 tablet 0  . nitroGLYCERIN (NITROSTAT) 0.4 MG SL tablet Place 1 tablet (0.4 mg total) under the tongue every 5 (five) minutes x 3 doses as needed for chest pain. 25 tablet 12  .  traMADol (ULTRAM) 50 MG tablet Take 1-2 tablets (50-100 mg total) by mouth every 6 (six) hours as needed (pain). 30 tablet 0  . traZODone (DESYREL) 100 MG tablet Take 1 tablet (100 mg total) by mouth at bedtime as needed for sleep. 30 tablet 2  . [DISCONTINUED] simvastatin (ZOCOR) 40 MG tablet Take 40 mg by mouth at bedtime.      No current facility-administered medications for this visit.       Physical Exam: BP 140/82 mmHg  Pulse 85  Resp 20  Ht '6\' 2"'$  (1.88 m)  Wt 180 lb (81.647 kg)  BMI 23.10 kg/m2  SpO2 98%  General appearance: alert and cooperative Neurologic: intact Heart: regular rate and rhythm,  S1, S2 normal, no murmur, click, rub or gallop Lungs: clear to auscultation bilaterally Abdomen: soft, non-tender; bowel sounds normal; no masses,  no organomegaly Extremities: extremities normal, atraumatic, no cyanosis or edema and Homans sign is negative, no sign of DVT Wound: very smll escar at left chest tube site, treated with silver nitrate No murmur of mitral insufficiency  Diagnostic Studies & Laboratory data:     Recent Radiology Findings:  Ct Super D Chest Wo Contrast  06/08/2015  CLINICAL DATA:  Followup of right apical pulmonary nodule. Prior CABG and valve replacement. Ex-smoker. EXAM: CT CHEST WITHOUT CONTRAST TECHNIQUE: Multidetector CT imaging of the chest was performed using thin slice collimation for electromagnetic bronchoscopy planning purposes, without intravenous contrast. COMPARISON:  03/09/2015 and 10/27/2014. FINDINGS: Mediastinum/Lymph Nodes: Aortic and branch vessel atherosclerosis. Moderate cardiomegaly with mitral valve repair. Prior median sternotomy. No mediastinal or definite hilar adenopathy, given limitations of unenhanced CT. Lungs/Pleura: No pleural fluid.  Mild centrilobular emphysema. The right apical complex nodule is less well-defined today. Solid component measures 4 mm on image 17/series 4 versus 5 mm on the prior. nodule in total measures 8 mm today versus 12 mm on the prior. On sagittal reformats today, 4 mm soft tissue component on image 43 versus 5 mm on the prior. 9 mm overall lesion size today versus 11 mm on the prior (when remeasured). Scarring at the left lung base. Upper abdomen: Normal imaged portions of the liver, spleen, stomach, pancreas, gallbladder, adrenal glands, kidneys. Bilateral renal artery stents. Musculoskeletal: No acute osseous abnormality. IMPRESSION: 1. Decreased size/definition of right apical pulmonary nodule. This suggests resolving infectious or inflammatory etiology. Recommend imaging surveillance with at least 1 more 3- 6 month  follow-up. 2.  No acute process in the chest. 3. Cardiomegaly with prior median sternotomy for CABG and mitral valve repair. Electronically Signed   By: Abigail Miyamoto M.D.   On: 06/08/2015 12:37    Ct Chest Wo Contrast  03/09/2015  CLINICAL DATA:  Followup pulmonary nodule. EXAM: CT CHEST WITHOUT CONTRAST TECHNIQUE: Multidetector CT imaging of the chest was performed following the standard protocol without IV contrast. COMPARISON:  Chest CT 10/27/2014 and 08/25/2014 FINDINGS: Mediastinum/Nodes: No breast masses, supraclavicular or axillary lymphadenopathy. The thyroid gland is grossly normal. The heart is normal in size. No pericardial effusion. Stable tortuosity, ectasia and calcifications involving the thoracic aorta. Prosthetic aortic valve is noted along with coronary artery bypass grafts. No mediastinal or hilar mass or adenopathy. The esophagus is grossly normal. Lungs/Pleura: The lungs are clear of acute process. Stable emphysematous changes. Again demonstrated is a right upper lobe pulmonary lesion which is partly solid and partly cystic. The solid component measures slightly larger on the coronal images. It measures 7.5 mm on image number 53 of series 601. It  previously measured 6 mm. The cystic component has a thicker ram than on the prior examinations. The largest measurement on the axial images of 11.5 mm and was previously 10.5 mm. Could not exclude a slow growing neoplasm. PET-CT may be helpful for further evaluation. It would not be an reasonable to continue to follow this with CT with a repeat CT scan in 4-6 months. No new regions.  No acute pulmonary findings.  No pleural effusion. Upper abdomen: No significant upper abdominal findings. Musculoskeletal: No significant bony findings. IMPRESSION: Slight interval increase in size of the right apical lung lesion as described above. Continued CT follow-up (4-6 months) or PET-CT are both reasonable. No new pulmonary lesions or acute pulmonary  findings. Stable emphysematous changes. No mediastinal or hilar mass or adenopathy. Electronically Signed   By: Marijo Sanes M.D.   On: 03/09/2015 13:30   I have independently reviewed the above radiology studies  and reviewed the findings with the patient.    CLINICAL DATA: Re-evaluate right upper lobe pulmonary nodule, prior to possible valve surgery. Initial encounter.  EXAM: CT CHEST WITHOUT CONTRAST  TECHNIQUE: Multidetector CT imaging of the chest was performed following the standard protocol without IV contrast.  COMPARISON: CT of the chest performed 08/25/2014  FINDINGS: The small slightly spiculated nodule at the right lung apex is again seen; there is a small associated cystic component which appears to have increased mildly in size, while the solid component has decreased slightly in size, measuring approximately 5 mm. Given the slight apparent interval decrease in size of the solid component from the recent prior study, this is thought more likely to be postinfectious in nature, though malignancy cannot be entirely excluded.  No additional pulmonary nodules are seen. The lungs are otherwise clear, aside from mild scarring or atelectasis at the left lung base. No pleural effusion or pneumothorax is seen.  Scattered coronary artery calcifications are noted. The mediastinum is otherwise unremarkable. No mediastinal lymphadenopathy is seen. No pericardial effusion is identified. The great vessels are grossly unremarkable, aside from scattered calcification. Mild calcification is noted along the thoracic aorta. The visualized portions of the thyroid gland are unremarkable. No axillary lymphadenopathy is appreciated.  No acute osseous abnormalities are identified.  IMPRESSION: 1. Small slightly spiculated nodule at the right lung apex is again seen. It has a small cystic component which appears to have increased mildly in size, while the solid component  has decreased slightly in size, measuring approximately 5 mm. Given the slight apparent interval decrease in size of the solid component from the recent prior CT, this is thought more likely to be postinfectious in nature, though malignancy cannot be entirely excluded. As this remains too small to further characterize, would recommend strict follow-up CT of the chest in 3-6 months, to ensure stability. 2. Mild scarring or atelectasis at the left lung base. 3. Scattered coronary artery calcifications seen.   Electronically Signed  By: Garald Balding M.D.  On: 10/27/2014 19:40  Recent Lab Findings: Lab Results  Component Value Date   WBC 12.1* 12/09/2014   HGB 8.5* 12/09/2014   HCT 25.9* 12/09/2014   PLT 88* 12/09/2014   GLUCOSE 88 12/09/2014   CHOL 181 08/24/2014   TRIG 88 08/24/2014   HDL 33* 08/24/2014   LDLDIRECT 185* 09/09/2011   LDLCALC 130* 08/24/2014   ALT 12* 11/28/2014   AST 16 11/28/2014   NA 136 12/09/2014   K 3.4* 12/09/2014   CL 101 12/09/2014   CREATININE 0.99 12/09/2014  BUN 8 12/09/2014   CO2 25 12/09/2014   TSH 1.756 11/28/2014   INR 1.8 02/27/2015   HGBA1C 6.3* 12/02/2014   ECHO: 03/10/2015 Study Conclusions  - Left ventricle: The cavity size was normal. Wall thickness was normal. Systolic function was mildly reduced. The estimated ejection fraction was in the range of 45% to 50%. There is hypokinesis of the basal-mid septal myocardium. - Aortic valve: There was moderate regurgitation. - Mitral valve: A bioprosthesis was present. - Left atrium: The atrium was mildly dilated.  Impressions:  - Basal septal hypokinesis with mildly reduced LV function; aortic valve not well visualized but probable moderate AI (2+); s/p MVR with no obvious MR; trace TR.  Transthoracic echocardiography. M-mode, complete 2D, spectral Doppler, and color Doppler. Birthdate: Patient birthdate: [birth year]. Age: Patient is 65 yr old. Sex: Gender:  female. BMI: 23.9 kg/m^2. Blood pressure:   106/64 Patient status: Outpatient. Study date: Study date: 03/10/2015. Study time: 02:26 PM. Location: [facility]  -------------------------------------------------------------------  ------------------------------------------------------------------- Left ventricle: The cavity size was normal. Wall thickness was normal. Systolic function was mildly reduced. The estimated ejection fraction was in the range of 45% to 50%. Regional wall motion abnormalities: There is hypokinesis of the basal-mid septal myocardium.  ------------------------------------------------------------------- Aortic valve: Poorly visualized. Doppler: Transvalvular velocity was within the normal range. There was no stenosis. There was moderate regurgitation.  ------------------------------------------------------------------- Aorta: Aortic root: The aortic root was normal in size.  ------------------------------------------------------------------- Mitral valve: A bioprosthesis was present. Doppler: There was no regurgitation.  Peak gradient (D): 8 mm Hg.  ------------------------------------------------------------------- Left atrium: The atrium was mildly dilated.  ------------------------------------------------------------------- Right ventricle: The cavity size was normal. Systolic function was normal.  ------------------------------------------------------------------- Pulmonic valve:  Doppler: Transvalvular velocity was within the normal range. There was no evidence for stenosis.  ------------------------------------------------------------------- Tricuspid valve:  Structurally normal valve.  Doppler: Transvalvular velocity was within the normal range. There was trivial regurgitation.  ------------------------------------------------------------------- Pulmonary artery:  Systolic pressure was within the normal  range.  ------------------------------------------------------------------- Right atrium: The atrium was normal in size.  ------------------------------------------------------------------- Pericardium: There was no pericardial effusion.  ------------------------------------------------------------------- Systemic veins: Inferior vena cava: The vessel was normal in size.   Assessment / Plan: In June 2016 she was noted to have a very small right upper lobe lung nodule preoperatively a repeat scan was done in August, that showed the solid component to decrease in size. Repeat ct of chest reviewed with patient , the waxing and waning cystic nodule in right upper lobe  Has  noted,  On today's CT of the chest Decreased size/definition of right apical pulmonary nodule. This suggests resolving infectious or inflammatory etiology Because of the waxing and waning nature of this lesion will obtain a follow-up CT scan in 3-4 months.  Doing well following coronary artery bypass grafting and mitral valve replacement for rheumatic valvular disease Coumadin has been stopped by Cardiology, 3 months post op with tissue mitral  valve and sinus rhythm    [name] MD      [address] [place] [address] Office [phone]   Beeper [phone]  06/08/2015 12:43 PM

## 2015-06-09 ENCOUNTER — Ambulatory Visit (HOSPITAL_COMMUNITY): Payer: No Typology Code available for payment source

## 2015-06-12 ENCOUNTER — Ambulatory Visit (HOSPITAL_COMMUNITY): Payer: No Typology Code available for payment source

## 2015-06-12 ENCOUNTER — Other Ambulatory Visit: Payer: Self-pay | Admitting: Family Medicine

## 2015-06-14 ENCOUNTER — Ambulatory Visit (HOSPITAL_COMMUNITY): Payer: No Typology Code available for payment source

## 2015-06-16 ENCOUNTER — Ambulatory Visit (HOSPITAL_COMMUNITY): Payer: No Typology Code available for payment source

## 2015-06-19 ENCOUNTER — Ambulatory Visit (HOSPITAL_COMMUNITY): Payer: No Typology Code available for payment source

## 2015-06-21 ENCOUNTER — Other Ambulatory Visit: Payer: Self-pay | Admitting: Family Medicine

## 2015-06-21 ENCOUNTER — Ambulatory Visit (HOSPITAL_COMMUNITY): Payer: No Typology Code available for payment source

## 2015-06-21 DIAGNOSIS — G4700 Insomnia, unspecified: Secondary | ICD-10-CM

## 2015-06-21 DIAGNOSIS — M545 Low back pain, unspecified: Secondary | ICD-10-CM

## 2015-06-21 NOTE — Telephone Encounter (Signed)
MD has no available appointments until 5/2.

## 2015-06-21 NOTE — Telephone Encounter (Signed)
Needs refills on trazadone, tramadol and flexeril.  Please call pt when ready for pickup

## 2015-06-22 MED ORDER — CYCLOBENZAPRINE HCL 10 MG PO TABS: ORAL_TABLET | ORAL | Status: DC

## 2015-06-22 MED ORDER — TRAZODONE HCL 100 MG PO TABS: 100.0000 mg | ORAL_TABLET | Freq: Every evening | ORAL | Status: DC | PRN

## 2015-06-22 MED ORDER — TRAMADOL HCL 50 MG PO TABS: 50.0000 mg | ORAL_TABLET | Freq: Four times a day (QID) | ORAL | Status: DC | PRN

## 2015-06-22 NOTE — Telephone Encounter (Signed)
Patient informed, appointment scheduled with PCP for 5/2.

## 2015-06-22 NOTE — Telephone Encounter (Signed)
Please let the patient know that I refilled her medications and left them up front. Let her know that she needs to follow up with me in May or I cannot continue to refill these medications.  Thanks, Archie Patten, MD [facility] Family Medicine Resident  06/22/2015, 1:48 PM

## 2015-06-23 ENCOUNTER — Ambulatory Visit (HOSPITAL_COMMUNITY): Payer: No Typology Code available for payment source

## 2015-06-26 ENCOUNTER — Ambulatory Visit (HOSPITAL_COMMUNITY): Payer: No Typology Code available for payment source

## 2015-06-28 ENCOUNTER — Ambulatory Visit (HOSPITAL_COMMUNITY): Payer: No Typology Code available for payment source

## 2015-06-30 ENCOUNTER — Ambulatory Visit (HOSPITAL_COMMUNITY): Payer: No Typology Code available for payment source

## 2015-07-03 ENCOUNTER — Ambulatory Visit (HOSPITAL_COMMUNITY): Payer: No Typology Code available for payment source

## 2015-07-05 ENCOUNTER — Ambulatory Visit (HOSPITAL_COMMUNITY): Payer: No Typology Code available for payment source

## 2015-07-07 ENCOUNTER — Ambulatory Visit (HOSPITAL_COMMUNITY): Payer: No Typology Code available for payment source

## 2015-07-10 ENCOUNTER — Ambulatory Visit (HOSPITAL_COMMUNITY): Payer: No Typology Code available for payment source

## 2015-07-10 ENCOUNTER — Ambulatory Visit: Payer: Medicaid Other | Admitting: Cardiology

## 2015-07-10 ENCOUNTER — Encounter: Payer: Self-pay | Admitting: *Deleted

## 2015-07-10 ENCOUNTER — Ambulatory Visit: Payer: No Typology Code available for payment source | Admitting: Cardiology

## 2015-07-12 ENCOUNTER — Ambulatory Visit (HOSPITAL_COMMUNITY): Payer: No Typology Code available for payment source

## 2015-07-14 ENCOUNTER — Ambulatory Visit (HOSPITAL_COMMUNITY): Payer: No Typology Code available for payment source

## 2015-07-17 ENCOUNTER — Ambulatory Visit (HOSPITAL_COMMUNITY): Payer: No Typology Code available for payment source

## 2015-07-19 ENCOUNTER — Ambulatory Visit (HOSPITAL_COMMUNITY): Payer: No Typology Code available for payment source

## 2015-07-19 ENCOUNTER — Other Ambulatory Visit: Payer: Self-pay | Admitting: Family Medicine

## 2015-07-21 ENCOUNTER — Ambulatory Visit (HOSPITAL_COMMUNITY): Payer: No Typology Code available for payment source

## 2015-07-24 ENCOUNTER — Ambulatory Visit (HOSPITAL_COMMUNITY): Payer: No Typology Code available for payment source

## 2015-07-25 ENCOUNTER — Ambulatory Visit (INDEPENDENT_AMBULATORY_CARE_PROVIDER_SITE_OTHER): Payer: Medicaid Other | Admitting: Family Medicine

## 2015-07-25 ENCOUNTER — Encounter: Payer: Self-pay | Admitting: Family Medicine

## 2015-07-25 VITALS — BP 166/78 | HR 91 | Temp 98.4°F | Wt 182.0 lb

## 2015-07-25 DIAGNOSIS — F329 Major depressive disorder, single episode, unspecified: Secondary | ICD-10-CM

## 2015-07-25 DIAGNOSIS — G4700 Insomnia, unspecified: Secondary | ICD-10-CM

## 2015-07-25 DIAGNOSIS — M545 Low back pain, unspecified: Secondary | ICD-10-CM

## 2015-07-25 DIAGNOSIS — Z720 Tobacco use: Secondary | ICD-10-CM

## 2015-07-25 DIAGNOSIS — I10 Essential (primary) hypertension: Secondary | ICD-10-CM

## 2015-07-25 DIAGNOSIS — R4589 Other symptoms and signs involving emotional state: Secondary | ICD-10-CM

## 2015-07-25 DIAGNOSIS — R109 Unspecified abdominal pain: Secondary | ICD-10-CM | POA: Diagnosis present

## 2015-07-25 LAB — POCT URINALYSIS DIPSTICK
GLUCOSE UA: NEGATIVE
Ketones, UA: NEGATIVE
Leukocytes, UA: NEGATIVE
NITRITE UA: NEGATIVE
Protein, UA: 100
UROBILINOGEN UA: 1
pH, UA: 6

## 2015-07-25 MED ORDER — TRAZODONE HCL 100 MG PO TABS: 100.0000 mg | ORAL_TABLET | Freq: Every evening | ORAL | Status: DC | PRN

## 2015-07-25 MED ORDER — TRAMADOL HCL 50 MG PO TABS: 50.0000 mg | ORAL_TABLET | Freq: Four times a day (QID) | ORAL | Status: DC | PRN

## 2015-07-25 MED ORDER — CYCLOBENZAPRINE HCL 10 MG PO TABS: ORAL_TABLET | ORAL | Status: DC

## 2015-07-25 MED ORDER — NICOTINE 14 MG/24HR TD PT24: 14.0000 mg | MEDICATED_PATCH | Freq: Every day | TRANSDERMAL | Status: DC

## 2015-07-25 NOTE — Progress Notes (Signed)
Patient ID: Leah [name], female   DOB: May 15, 1950, 65 y.o.   MRN: [id number]    Subjective: CC:  Pain f/u HPI: Patient is a 65 y.o. female with a past medical history of rheumatic disease, HTN, CAD presenting to clinic today for a pain follow up. She is s/p MVR with pericardial tissue valve and CABG x 1 with left IMA to the LAD on 9/12.   Concerns of depression:  Patient notes over the last month she's been more depressed. She notes she doesn't want to get out of the bed. Her mind is racing. She has issues falling asleep and staying asleep, even with trazodone. She normally falls asleep around 12 and wakes up around 7am. She notes if she has an issue falling asleep, she will just sleep in, sometimes until 3pm.  Concentration is poor. Appetite is stable. No ideation of self harm. No HI/SI.   Flank pain: left flank pain that started a few weeks ago. Pain is intermittently achy. It feels like she could have kidney stones. Mild dysuria, malodorous urine.  No urinary frequency or urgency. No hematuria, vaginal pruritus, vaginal discharge.   Hypertension: doesn't wish to discuss elevated BPs, notes she's incredibly stressed at this point and knew it would be high.   Lumbar pain Pain pretty well controlled currently with tramadol. Pain is more located in the left paraspinal muscles today and with flexion is noted midline.  It feels pulling and tight, sometimes throbbing. She feels the pain is most severe when sitting or standing too long. Still hasn't adopted an exercise program and notes being more active may help.  Patient denies radicular pain. No urinary or bowel incontinence. No saddle paresthesias.   Social History: started back smoking; 10 cigs/day.   Health Maintenance: patient aware she's due for multiple things such as colonoscopy and mammogram. Will make a dedicated appt for pap smear and HIV/Hep C testing.  CT chest 05/2015: Decreased size/definition of right apical pulmonary nodule.  This suggests resolving infectious or inflammatory etiology. Recommend imaging surveillance with at least 1 more 3- 6 month follow-up.  ROS: All other systems reviewed and are negative besides that noted in HPI.  Past Medical History Patient Active Problem List   Diagnosis Date Noted  . Depressed mood 07/27/2015  . Weakness   . Long-term (current) use of anticoagulants 12/14/2014  . S/P MVR (mitral valve replacement) 12/05/2014  . Moderate aortic regurgitation 10/26/2014  . Renal vascular disease 10/26/2014  . Acute on chronic diastolic congestive heart failure ([place]) 10/21/2014  . Tobacco use 10/21/2014  . Troponin level elevated-(felt to be from CHF) 10/21/2014  . Diastolic dysfunction, grade 2 by echo June 2016 10/21/2014  . Carpal tunnel syndrome 10/17/2014  . Left shoulder pain 09/17/2014  . Pulmonary nodule, right-needs repeat CT in Dec 2016 09/17/2014  . Abnormal CXR 08/25/2014  . Severe mitral regurgitation 08/25/2014  . Unstable angina ([place]) 08/22/2014  . Hematuria 07/01/2014  . Low back pain 07/05/2012  . Insomnia 07/05/2012  . PVC (premature ventricular contraction) 10/02/2011  . Fatigue 09/04/2011  . Chest pain 09/04/2011  . History of angioedema with ACE 2013 06/14/2011  . PVD- s/p multiple proceedures 06/04/2011  . Hyperlipidemia 09/28/2008  . Essential hypertension, benign 09/28/2008  . CAD S/P LAD DES 2009 with 70% ISR 08/24/14 09/28/2008     Medications- reviewed and updated Current Outpatient Prescriptions  Medication Sig Dispense Refill  . acetaminophen (TYLENOL) 325 MG tablet Take 2 tablets (650 mg total) by mouth every  4 (four) hours as needed for fever, headache or mild pain.    Marland Kitchen albuterol (PROVENTIL HFA;VENTOLIN HFA) 108 (90 BASE) MCG/ACT inhaler Inhale 2 puffs into the lungs every 6 (six) hours as needed for wheezing or shortness of breath. 1 Inhaler 0  . amLODipine (NORVASC) 5 MG tablet TAKE TWO TABLETS BY MOUTH ONCE DAILY 60 tablet 0  . aspirin EC 81  MG tablet Take 1 tablet (81 mg total) by mouth daily.    Marland Kitchen atorvastatin (LIPITOR) 80 MG tablet Take 1 tablet (80 mg total) by mouth daily. 30 tablet 2  . cyclobenzaprine (FLEXERIL) 10 MG tablet TAKE 1 TABLET BY MOUTH 3 TIMES DAILY AS NEEDED FOR MUSCLE SPASMS 70 tablet 2  . metoprolol tartrate (LOPRESSOR) 25 MG tablet Take 0.5 tablets (12.5 mg total) by mouth 2 (two) times daily. 30 tablet 0  . nicotine (NICODERM CQ - DOSED IN MG/24 HOURS) 14 mg/24hr patch Place 1 patch (14 mg total) onto the skin daily. 28 patch 2  . nitroGLYCERIN (NITROSTAT) 0.4 MG SL tablet Place 1 tablet (0.4 mg total) under the tongue every 5 (five) minutes x 3 doses as needed for chest pain. 25 tablet 12  . traMADol (ULTRAM) 50 MG tablet Take 1-2 tablets (50-100 mg total) by mouth every 6 (six) hours as needed (pain). 30 tablet 1  . traZODone (DESYREL) 100 MG tablet Take 1 tablet (100 mg total) by mouth at bedtime as needed for sleep. 30 tablet 2  . [DISCONTINUED] simvastatin (ZOCOR) 40 MG tablet Take 40 mg by mouth at bedtime.      No current facility-administered medications for this visit.    Objective: Office vital signs reviewed. BP 166/78 mmHg  Pulse 91  Temp(Src) 98.4 F (36.9 C) (Oral)  Wt 182 lb (82.555 kg)   Physical Examination:  General: Awake, alert, well- nourished, NAD Cardio: Regular rate and rhythm. I/VI systolic murmur. Well healed scar.  Pulm: No increased WOB.  CTAB, without wheezes, rhonchi or crackles noted. 2+ DP pulses Abd: +BS, soft, non-distended, non-tender. No CVA tenderness.  MSK: TTP over left lumbar paraspinal muscles, mild tenderness located on the right. No drop-offs noted. Negative SLR. 5/5 strength in the UE and LE bilaterally. Normal gait and station.  Psych: A&Ox4. Speech is normal tone, non-pressured. Stated mood depressed, affect congruent. No SI, HI, AVH. Does not appear internally distracted.    Assessment/Plan: Tobacco use Patient started back smoking again. She cannot  take Chantix or Wellbutrin due to prior undesired side effects. She does not like nicotine gum. I provided her a Rx for nicotine patches, unsure if Medicaid will cover it. Given her multiple comorbidities, smoking cessation would be very important for her.  - Rx for nicotine patches - 1800 quit line number provided  - in the future, will discuss smoking cessation counseling in our clinic  Essential hypertension, benign BP elevated today, patient associates this with pain and anxiety. - will hold off on changing regimen now given she's had normal BPs in the past, will increase amlodipine at next visit if her BP is still elevated.   Low back pain No significant change. No red flags on exam or history. - Refilled tramadol.  - patient to follow up.   Depressed mood Patient presenting with depressive symptoms over the last few weeks. This is quite common after open heart surgery. Discussed non-pharmacologic management initially. She notes that she doesn't have interactions with many of her family/friends and doesn't really do much. We discussed ways she  could mentally and physically start feeling better as she also has poor sleep hygiene as well. She will wake up at 8:30 each morning and go walking. She'll explore activities she can partake in such as volunteeing in the community as well. - patient to f/u in 1 month or sooner as needed - if no improvement, will start pharmacologic therapy. - discussed seeking care from ED, friends, family, or our clinic if she starts having SI or HI.   Flank pain: U/A not consistent with infection. May have nephrolithiasis, however U/A and physical exam not consistent with this. Advised to seek medical care if she developed N/V, worsening abdominal pain, inability to urinate, of fevers/chills.   Archie Patten PGY-2, [place]

## 2015-07-25 NOTE — Patient Instructions (Addendum)
Wake up by 8:30 every morning and go for walk. Try to find a reason to get up and get out of bed every day. I have prescribed patches, if your insurance does not cover it at the pharmacy call 1800-QUIT-NOW (sometimes they provide free nicotine patches) and they also have a counselor available 24 hours per day.  If you start having worsening side pain, fevers, chills, nausea, vomiting, or an inability to urinate follow up immediately.  Follow up with me in 2-4 weeks.

## 2015-07-26 ENCOUNTER — Ambulatory Visit (HOSPITAL_COMMUNITY): Payer: No Typology Code available for payment source

## 2015-07-26 NOTE — Assessment & Plan Note (Signed)
Patient started back smoking again. She cannot take Chantix or Wellbutrin due to prior undesired side effects. She does not like nicotine gum. I provided her a Rx for nicotine patches, unsure if Medicaid will cover it. Given her multiple comorbidities, smoking cessation would be very important for her.  - Rx for nicotine patches - 1800 quit line number provided  - in the future, will discuss smoking cessation counseling in our clinic

## 2015-07-27 DIAGNOSIS — R4589 Other symptoms and signs involving emotional state: Secondary | ICD-10-CM | POA: Insufficient documentation

## 2015-07-27 DIAGNOSIS — F329 Major depressive disorder, single episode, unspecified: Secondary | ICD-10-CM

## 2015-07-27 NOTE — Assessment & Plan Note (Signed)
No significant change. No red flags on exam or history. - Refilled tramadol.  - patient to follow up.

## 2015-07-27 NOTE — Assessment & Plan Note (Signed)
Patient presenting with depressive symptoms over the last few weeks. This is quite common after open heart surgery. Discussed non-pharmacologic management initially. She notes that she doesn't have interactions with many of her family/friends and doesn't really do much. We discussed ways she could mentally and physically start feeling better as she also has poor sleep hygiene as well. She will wake up at 8:30 each morning and go walking. She'll explore activities she can partake in such as volunteeing in the community as well. - patient to f/u in 1 month or sooner as needed - if no improvement, will start pharmacologic therapy. - discussed seeking care from ED, friends, family, or our clinic if she starts having SI or HI.

## 2015-07-27 NOTE — Assessment & Plan Note (Signed)
BP elevated today, patient associates this with pain and anxiety. - will hold off on changing regimen now given she's had normal BPs in the past, will increase amlodipine at next visit if her BP is still elevated.

## 2015-07-28 ENCOUNTER — Ambulatory Visit (HOSPITAL_COMMUNITY): Payer: No Typology Code available for payment source

## 2015-08-17 ENCOUNTER — Other Ambulatory Visit: Payer: Self-pay | Admitting: Family Medicine

## 2015-08-23 ENCOUNTER — Other Ambulatory Visit: Payer: Self-pay | Admitting: Cardiothoracic Surgery

## 2015-08-23 DIAGNOSIS — R911 Solitary pulmonary nodule: Secondary | ICD-10-CM

## 2015-08-24 ENCOUNTER — Other Ambulatory Visit: Payer: Self-pay | Admitting: Family Medicine

## 2015-08-24 NOTE — Telephone Encounter (Signed)
Please let pt know that I called in her Rx for tramadol to the [facility]. Please ask her to make an appt with me within the next 2 months.  Thanks, Archie Patten, MD [facility] Resident  08/24/2015, 5:28 PM\

## 2015-09-04 NOTE — Telephone Encounter (Signed)
Patient informed, appointment scheduled for 7/31.

## 2015-09-14 ENCOUNTER — Emergency Department (HOSPITAL_BASED_OUTPATIENT_CLINIC_OR_DEPARTMENT_OTHER)
Admit: 2015-09-14 | Discharge: 2015-09-14 | Disposition: A | Discharge status: Home / Self Care | Payer: Medicaid Other | Attending: Emergency Medicine | Admitting: Emergency Medicine

## 2015-09-14 ENCOUNTER — Telehealth: Payer: Self-pay | Admitting: Family Medicine

## 2015-09-14 ENCOUNTER — Encounter (HOSPITAL_COMMUNITY): Payer: Self-pay | Admitting: Nurse Practitioner

## 2015-09-14 ENCOUNTER — Emergency Department (HOSPITAL_COMMUNITY)
Admission: EM | Admit: 2015-09-14 | Discharge: 2015-09-14 | Disposition: A | Discharge status: Home / Self Care | Payer: Medicaid Other | Attending: Emergency Medicine | Admitting: Emergency Medicine

## 2015-09-14 DIAGNOSIS — M79609 Pain in unspecified limb: Secondary | ICD-10-CM | POA: Diagnosis not present

## 2015-09-14 DIAGNOSIS — Z79899 Other long term (current) drug therapy: Secondary | ICD-10-CM | POA: Diagnosis not present

## 2015-09-14 DIAGNOSIS — I2510 Atherosclerotic heart disease of native coronary artery without angina pectoris: Secondary | ICD-10-CM | POA: Insufficient documentation

## 2015-09-14 DIAGNOSIS — Z87891 Personal history of nicotine dependence: Secondary | ICD-10-CM | POA: Diagnosis not present

## 2015-09-14 DIAGNOSIS — Z7982 Long term (current) use of aspirin: Secondary | ICD-10-CM | POA: Diagnosis not present

## 2015-09-14 DIAGNOSIS — J449 Chronic obstructive pulmonary disease, unspecified: Secondary | ICD-10-CM | POA: Diagnosis not present

## 2015-09-14 DIAGNOSIS — M79605 Pain in left leg: Secondary | ICD-10-CM | POA: Diagnosis present

## 2015-09-14 DIAGNOSIS — M5440 Lumbago with sciatica, unspecified side: Secondary | ICD-10-CM

## 2015-09-14 DIAGNOSIS — Z951 Presence of aortocoronary bypass graft: Secondary | ICD-10-CM | POA: Insufficient documentation

## 2015-09-14 DIAGNOSIS — I10 Essential (primary) hypertension: Secondary | ICD-10-CM | POA: Insufficient documentation

## 2015-09-14 MED ORDER — KETOROLAC TROMETHAMINE 60 MG/2ML IM SOLN
60.0000 mg | Freq: Once | INTRAMUSCULAR | Status: AC
  Administered 2015-09-14: 60 mg via INTRAMUSCULAR
  Filled 2015-09-14: qty 2

## 2015-09-14 MED ORDER — OXYCODONE-ACETAMINOPHEN 5-325 MG PO TABS: 1.0000 | ORAL_TABLET | ORAL | Status: DC | PRN

## 2015-09-14 MED ORDER — METHOCARBAMOL 500 MG PO TABS: 500.0000 mg | ORAL_TABLET | Freq: Two times a day (BID) | ORAL | Status: DC

## 2015-09-14 MED ORDER — PREDNISONE 10 MG PO TABS: 20.0000 mg | ORAL_TABLET | Freq: Two times a day (BID) | ORAL | Status: DC

## 2015-09-14 MED ORDER — NAPROXEN 500 MG PO TABS: 500.0000 mg | ORAL_TABLET | Freq: Two times a day (BID) | ORAL | Status: DC

## 2015-09-14 NOTE — Progress Notes (Signed)
VASCULAR LAB PRELIMINARY  PRELIMINARY  PRELIMINARY  PRELIMINARY  Left lower extremity venous duplex completed.    Preliminary report:  There is no DVT or SVT noted in the left lower extremity.   Merly Hinkson, RVT 09/14/2015, 6:20 PM

## 2015-09-14 NOTE — ED Notes (Signed)
She c/o several week history of L sided leg pain from hip to foot. She has noticed some swelling around her L ankle as well. Swelling and pain increased at end of day, after standing on her legs. She describes pain as 'throbbing." She denies any injuries. She is ambulatory , alert and breathign easily

## 2015-09-14 NOTE — Discharge Instructions (Signed)
You have been seen today for leg pain. Your imaging showed no abnormalities. Take 500 mg of naproxen every 12 hours or 800 mg of ibuprofen every 8 hours for the next 3 days. Take these medications with food to avoid upset stomach. Robaxin is a muscle relaxer and may help loosen stiff muscles. Percocet for severe pain. Do not take the Robaxin or Percocet while driving or performing other dangerous activities. Follow up with orthopedics on this matter. Call the number provided to set up an appointment. Follow up with PCP as needed should symptoms continue. Return to ED should symptoms worsen.

## 2015-09-14 NOTE — ED Notes (Signed)
Pt waiting for venous doppler at this time

## 2015-09-14 NOTE — ED Provider Notes (Signed)
CSN: [id number]     Arrival date & time 09/14/15  1453 History  By signing my name below, I, Rayna Sexton, attest that this documentation has been prepared under the direction and in the presence of Shawn C. Joy, PA-C. Electronically Signed: Rayna Sexton, ED Scribe. 09/14/2015. 5:06 PM.   Chief Complaint  Patient presents with  . Leg Pain   The history is provided by the patient. No language interpreter was used.   HPI Comments: Leah [name] is a 65 y.o. female who presents to the Emergency Department complaining of constant, moderate, throbbing, left lower leg pain x 2 weeks which worsened beginning 4 days ago. Pain seems to radiate down her left leg and into her left ankle. The worst pain is in the lower leg and ankle. She reports associated, mild, left ankle swelling. She states that she wakes in the morning with mild pain and swelling which worsens throughout the day with it being worst at night when lying down. Pt denies taking any medications for her symptoms. She reports a hx of multiple incidences of clots in her LLE and in various other vessels in her body, at least one that had to be surgically removed (LLE, ~5 years ago). Pt states she has been on Coumadin in the past, but is now only on 81 mg of aspirin daily. Pt denies a PMHx of PE or CA. Pt confirms her listed allergies. She denies SOB, incontinence of her bowels or bladder, abd pain, neuro deficits, or any other associated symptoms at this time.   Past Medical History  Diagnosis Date  . Hyperlipidemia   . Hypertension   . GERD (gastroesophageal reflux disease)   . Leg pain   . Headache(784.0)   . Carotid artery occlusion   . COPD (chronic obstructive pulmonary disease) ([place])   . CAD (coronary artery disease)   . Peripheral vascular disease ([place])   . Tobacco abuse   . Shortness of breath dyspnea   . Renal vascular disease 10/26/2014    bilateral stents placed   . Anemia   . Severe mitral regurgitation    Past  Surgical History  Procedure Laterality Date  . Carotid endarterectomy  11/21/2007    left  . Angioplasty / stenting iliac  2010    right external iliac by Dr. Irish Lack  . Coronary angioplasty with stent placement  6/09    LAD 2.5x12 Promus  . Femoral-popliteal bypass graft  10/12    left Dr. Kellie Simmering  . Abdominal hysterectomy    . Cardiac catheterization  11/13/11    Left Heart Cath. with Coronary Angiogram  . Cardiac catheterization N/A 08/24/2014    Procedure: Left Heart Cath and Coronary Angiography;  Surgeon: Wellington Hampshire, MD;  Location: [facility];  Service: Cardiovascular;  Laterality: N/A;  . Renal artery stent Bilateral   . Tee without cardioversion N/A 10/24/2014    Procedure: TRANSESOPHAGEAL ECHOCARDIOGRAM (TEE);  Surgeon: Thayer Headings, MD;  Location: [facility];  Service: Cardiovascular;  Laterality: N/A;  . Cardiac catheterization N/A 10/26/2014    Procedure: Right/Left Heart Cath and Coronary Angiography;  Surgeon: Peter M Martinique, MD; oLAD 70%, mLAD 70% ISR, D2 30%, OFC 30%, RCA 20%, EF nl, low R heart pressures after diuresis, severe MR  . Mitral valve replacement N/A 12/05/2014    Procedure: MITRAL VALVE (MV) REPLACEMENT;  Surgeon: [name], MD;  Location: [facility];  Service: Open Heart Surgery;  Laterality: N/A;  Closure left atrial appendage  .  Coronary artery bypass graft N/A 12/05/2014    Procedure: CORONARY ARTERY BYPASS GRAFTING (CABG);  Surgeon: [name], MD;  Location: [facility];  Service: Open Heart Surgery;  Laterality: N/A;  Times 1 using left internal mammary artery to LAD  . Tee without cardioversion N/A 12/05/2014    Procedure: TRANSESOPHAGEAL ECHOCARDIOGRAM (TEE);  Surgeon: [name], MD;  Location: [facility];  Service: Open Heart Surgery;  Laterality: N/A;   Family History  Problem Relation Age of Onset  . Cancer Mother     BRAIN AND LUNG  . Hypertension Father   . Heart disease Father   . Heart attack Neg Hx   . Stroke Paternal Aunt      great aunt  . Hypertension Mother   . Hypertension Brother    Social History  Substance Use Topics  . Smoking status: Former Smoker -- 0.50 packs/day for 35 years    Types: Cigarettes    Quit date: 10/31/2014  . Smokeless tobacco: Never Used     Comment: has not started wellbutrin yet  . Alcohol Use: No   OB History    No data available     Review of Systems  Constitutional: Negative for fever, chills and diaphoresis.  Respiratory: Negative for shortness of breath.   Gastrointestinal: Negative for abdominal pain.  Musculoskeletal: Positive for myalgias, joint swelling and arthralgias.  Skin: Negative for color change and wound.  Neurological: Negative for weakness and numbness.  All other systems reviewed and are negative.   Allergies  Lisinopril; Aspirin; Chantix; and Penicillins  Home Medications   Prior to Admission medications   Medication Sig Start Date End Date Taking? Authorizing Provider  acetaminophen (TYLENOL) 325 MG tablet Take 2 tablets (650 mg total) by mouth every 4 (four) hours as needed for fever, headache or mild pain. 08/25/14   Erlene Quan, PA-C  albuterol (PROVENTIL HFA;VENTOLIN HFA) 108 (90 BASE) MCG/ACT inhaler Inhale 2 puffs into the lungs every 6 (six) hours as needed for wheezing or shortness of breath. 10/31/14   Archie Patten, MD  amLODipine (NORVASC) 5 MG tablet TAKE TWO TABLETS BY MOUTH ONCE DAILY 08/18/15   Janora Norlander, DO  aspirin EC 81 MG tablet Take 1 tablet (81 mg total) by mouth daily. 08/25/14   Erlene Quan, PA-C  atorvastatin (LIPITOR) 80 MG tablet Take 1 tablet (80 mg total) by mouth daily. 01/24/15   Archie Patten, MD  cyclobenzaprine (FLEXERIL) 10 MG tablet TAKE 1 TABLET BY MOUTH 3 TIMES DAILY AS NEEDED FOR MUSCLE SPASMS 07/25/15   Archie Patten, MD  methocarbamol (ROBAXIN) 500 MG tablet Take 1 tablet (500 mg total) by mouth 2 (two) times daily. 09/14/15   Shawn C Joy, PA-C  metoprolol tartrate (LOPRESSOR) 25 MG tablet  Take 0.5 tablets (12.5 mg total) by mouth 2 (two) times daily. 02/10/15   Archie Patten, MD  naproxen (NAPROSYN) 500 MG tablet Take 1 tablet (500 mg total) by mouth 2 (two) times daily. 09/14/15   Shawn C Joy, PA-C  nicotine (NICODERM CQ - DOSED IN MG/24 HOURS) 14 mg/24hr patch Place 1 patch (14 mg total) onto the skin daily. 07/25/15   Archie Patten, MD  nitroGLYCERIN (NITROSTAT) 0.4 MG SL tablet Place 1 tablet (0.4 mg total) under the tongue every 5 (five) minutes x 3 doses as needed for chest pain. 10/28/14   Evelene Croon Barrett, PA-C  oxyCODONE-acetaminophen (PERCOCET/ROXICET) 5-325 MG tablet Take 1 tablet by mouth every 4 (  four) hours as needed for severe pain. 09/14/15   Shawn C Joy, PA-C  predniSONE (DELTASONE) 10 MG tablet Take 2 tablets (20 mg total) by mouth 2 (two) times daily with a meal. 09/14/15   Shawn C Joy, PA-C  traMADol (ULTRAM) 50 MG tablet TAKE ONE TO TWO TABLETS BY MOUTH EVERY 6 HOURS AS NEEDED FOR PAIN 08/24/15   Archie Patten, MD  traZODone (DESYREL) 100 MG tablet Take 1 tablet (100 mg total) by mouth at bedtime as needed for sleep. 07/25/15   Archie Patten, MD   BP 139/93 mmHg  Pulse 61  Temp(Src) 98.9 F (37.2 C) (Oral)  Resp 16  Ht 6' 1.5" (1.867 m)  Wt 81.647 kg  BMI 23.42 kg/m2  SpO2 99%    Physical Exam  Constitutional: She appears well-developed and well-nourished. No distress.  HENT:  Head: Normocephalic and atraumatic.  Eyes: Conjunctivae are normal.  Neck: Neck supple.  Cardiovascular: Normal rate, regular rhythm, normal heart sounds and intact distal pulses.   Pulmonary/Chest: Effort normal and breath sounds normal. No respiratory distress.  Abdominal: Soft. There is no tenderness. There is no guarding.  Musculoskeletal: She exhibits no edema or tenderness.  FROM from the left hip downward in the LLE. TTP mostly over the left calf and medial thigh. No discernable swelling or redness.   Lymphadenopathy:    She has no cervical adenopathy.  Neurological:  She is alert. She has normal reflexes.  No sensory deficits. Strength 5/5 in all extremities. No gait disturbance. Coordination intact.   Skin: Skin is warm and dry. She is not diaphoretic.  Psychiatric: She has a normal mood and affect. Her behavior is normal.  Nursing note and vitals reviewed.   ED Course  Procedures  DIAGNOSTIC STUDIES: Oxygen Saturation is 99% on RA, normal by my interpretation.    COORDINATION OF CARE: 5:04 PM Discussed next steps with pt including an [place] LLE to rule out DVT. Pt verbalized understanding and is agreeable with the plan.     MDM   Final diagnoses:  Leg pain, diffuse, left    KAELIE HENIGAN presents with left leg pain for the last several weeks.  Suspect a more simple explanation such as arthritic changes, however, due to the patient's history of multiple blood clots, a duplex ultrasound is warranted. Preliminary duplex ultrasound report reveals no evidence of DVT or other abnormality. Patient to follow-up with orthopedics. Return precautions discussed. Patient voiced understanding of these instructions and is comfortable with discharge.  Patient was noted to have had the following procedures performed In September 2016: Mitral valve replacement with [facility] pericardial tissue valve and coronary artery bypass grafting x1 with the left internal mammary to the left anterior descending coronary artery. Closure of left atrial appendage.   I personally performed the services described in this documentation, which was scribed in my presence. The recorded information has been reviewed and is accurate.   Lorayne Bender, PA-C 09/14/15 [place], MD 09/15/15 0111

## 2015-09-14 NOTE — Telephone Encounter (Signed)
Pt is calling and would like to speak to Dr. Lorenso Courier about her left leg. She is having pain again and wants to make sure that she doesn't have another blockage. j w

## 2015-09-15 NOTE — Telephone Encounter (Signed)
Returned call. Patient went to the ED to have her leg evaluated (which is completely appropriate if she was worried about a clot). She's had throbbing left leg pain x 2 weeks worse over the last 4 days. Pain radiates into the ankle.   US of the LLE was performed and ruled out DVT. ED suspected sciatica and suggested referral to an orthopedist. Given patient's h/o lumbar back pain, this seems very consistent.  Will place referral for orthopedist today. Patient advised.  Archie Patten, MD [facility] Family Medicine Resident

## 2015-09-20 ENCOUNTER — Other Ambulatory Visit: Payer: Self-pay | Admitting: *Deleted

## 2015-09-20 ENCOUNTER — Telehealth: Payer: Self-pay | Admitting: *Deleted

## 2015-09-20 NOTE — Telephone Encounter (Signed)
Called Leah [name] and advised her the Internal Med has same NPI # as our office and is okay to schedule patient.

## 2015-09-20 NOTE — Telephone Encounter (Signed)
Candy called from [facility].  They received the fax for her referrral but states that on [facility] [place] [employer] patient's card shows [facility] internal medicine as her pcp office.  Will forward to [place] per candy's request.  Leah [name]

## 2015-09-21 ENCOUNTER — Encounter: Payer: Self-pay | Admitting: Cardiothoracic Surgery

## 2015-09-21 ENCOUNTER — Encounter: Payer: Medicaid Other | Admitting: Cardiothoracic Surgery

## 2015-09-21 ENCOUNTER — Ambulatory Visit (INDEPENDENT_AMBULATORY_CARE_PROVIDER_SITE_OTHER): Payer: Medicaid Other | Admitting: Cardiothoracic Surgery

## 2015-09-21 ENCOUNTER — Ambulatory Visit
Admission: RE | Admit: 2015-09-21 | Discharge: 2015-09-21 | Disposition: A | Discharge status: Home / Self Care | Payer: Medicaid Other | Source: Ambulatory Visit | Attending: Cardiothoracic Surgery | Admitting: Cardiothoracic Surgery

## 2015-09-21 VITALS — BP 149/79 | HR 85 | Resp 16 | Ht 73.5 in | Wt 180.0 lb

## 2015-09-21 DIAGNOSIS — R911 Solitary pulmonary nodule: Secondary | ICD-10-CM

## 2015-09-21 DIAGNOSIS — Z952 Presence of prosthetic heart valve: Secondary | ICD-10-CM

## 2015-09-21 DIAGNOSIS — Z951 Presence of aortocoronary bypass graft: Secondary | ICD-10-CM

## 2015-09-21 DIAGNOSIS — Z954 Presence of other heart-valve replacement: Secondary | ICD-10-CM

## 2015-09-21 NOTE — Progress Notes (Signed)
[address]       [place] [address]             [phone]      Patty F Krumholz [facility] Medical Record #[id number] Date of Birth: Jul 18, 1950  Referring: [place], Peter M, MD Primary Care: Kathrine Cords, MD  Chief Complaint:   POST OP FOLLOW UP 12/05/2014  OPERATIVE REPORT PREOPERATIVE DIAGNOSES: 1. Severe mitral insufficiency. 2. Aortic insufficiency. 3. Coronary occlusive disease. POSTOPERATIVE DIAGNOSES: 1. Severe mitral insufficiency. 2. Aortic insufficiency. 3. Coronary occlusive disease. 4. Aortic insufficiency, mild. PROCEDURE PERFORMED: Mitral valve replacement with [facility] pericardial tissue valve, model 7300TFX 29 mm, serial #[id number] and coronary artery bypass grafting x1 with the left internal mammary to the left anterior descending coronary artery. Closure of left atrial appendage. SURGEON: Lanelle Bal, MD.  History of Present Illness:      Patient doing well since her mitral valve replacement and coronary artery bypass grafting in September 2016. At the time of her surgical evaluation she was noted to have a right upper lobe lung nodule . She comes in today for follow-up CT scan of the chest . She remains in sinus rhythm and off of Coumadin. Most recently she was in the emergency room the sciatic left back and leg pain, she's been referred to orthopedics  The patient is no longer smoking but has a much greater than 30-pack-year history of smoking and a family history of lung cancer in her mother.  Past Medical History  Diagnosis Date  . Hyperlipidemia   . Hypertension   . GERD (gastroesophageal reflux disease)   . Leg pain   . Headache(784.0)   . Carotid artery occlusion   . COPD (chronic obstructive pulmonary disease) ([place])   . CAD (coronary artery disease)   . Peripheral vascular disease ([place])   . Tobacco abuse   . Shortness of breath dyspnea   . Renal vascular disease 10/26/2014    bilateral stents  placed   . Anemia   . Severe mitral regurgitation      History  Smoking status  . Former Smoker -- 0.50 packs/day for 35 years  . Types: Cigarettes  . Quit date: 10/31/2014  Smokeless tobacco  . Never Used    Comment: has not started wellbutrin yet    History  Alcohol Use No     Allergies  Allergen Reactions  . Lisinopril Swelling    Angioedema 06/10/11  . Aspirin Other (See Comments)    Upset stomach  . Chantix [Varenicline]     insomnia  . Penicillins Hives    Current Outpatient Prescriptions  Medication Sig Dispense Refill  . acetaminophen (TYLENOL) 325 MG tablet Take 2 tablets (650 mg total) by mouth every 4 (four) hours as needed for fever, headache or mild pain.    Marland Kitchen albuterol (PROVENTIL HFA;VENTOLIN HFA) 108 (90 BASE) MCG/ACT inhaler Inhale 2 puffs into the lungs every 6 (six) hours as needed for wheezing or shortness of breath. 1 Inhaler 0  . amLODipine (NORVASC) 5 MG tablet TAKE TWO TABLETS BY MOUTH ONCE DAILY 60 tablet 5  . aspirin EC 81 MG tablet Take 1 tablet (81 mg total) by mouth daily.    Marland Kitchen atorvastatin (LIPITOR) 80 MG tablet Take 1 tablet (80 mg total) by mouth daily. 30 tablet 2  . cyclobenzaprine (FLEXERIL) 10 MG tablet TAKE 1 TABLET BY MOUTH 3 TIMES DAILY AS NEEDED FOR MUSCLE SPASMS 70 tablet 2  . metoprolol tartrate (LOPRESSOR)  25 MG tablet Take 0.5 tablets (12.5 mg total) by mouth 2 (two) times daily. 30 tablet 0  . nicotine (NICODERM CQ - DOSED IN MG/24 HOURS) 14 mg/24hr patch Place 1 patch (14 mg total) onto the skin daily. 28 patch 2  . nitroGLYCERIN (NITROSTAT) 0.4 MG SL tablet Place 1 tablet (0.4 mg total) under the tongue every 5 (five) minutes x 3 doses as needed for chest pain. 25 tablet 12  . traMADol (ULTRAM) 50 MG tablet TAKE ONE TO TWO TABLETS BY MOUTH EVERY 6 HOURS AS NEEDED FOR PAIN 30 tablet 0  . traZODone (DESYREL) 100 MG tablet Take 1 tablet (100 mg total) by mouth at bedtime as needed for sleep. 30 tablet 2  . [DISCONTINUED]  simvastatin (ZOCOR) 40 MG tablet Take 40 mg by mouth at bedtime.      No current facility-administered medications for this visit.       Physical Exam: BP 149/79 mmHg  Pulse 85  Resp 16  Ht 6' 1.5" (1.867 m)  Wt 180 lb (81.647 kg)  BMI 23.42 kg/m2  SpO2 98%  General appearance: alert and cooperative Neurologic: intact Heart: regular rate and rhythm, S1, S2 normal, no murmur, No murmur of mitral insufficiencyclick, rub or gallop Lungs: clear to auscultation bilaterally Abdomen: soft, non-tender; bowel sounds normal; no masses,  no organomegaly Extremities: extremities normal, atraumatic, no cyanosis or edema and Homans sign is negative, no sign of DVT Wound: very smll escar at left chest tube site, treated with silver nitrate   Diagnostic Studies & Laboratory data:     Recent Radiology Findings:  Ct Chest Wo Contrast  09/21/2015  CLINICAL DATA:  Follow-up pulmonary nodule.  Current smoker. EXAM: CT CHEST WITHOUT CONTRAST TECHNIQUE: Multidetector CT imaging of the chest was performed following the standard protocol without IV contrast. COMPARISON:  06/08/2015 chest CT. FINDINGS: Mediastinum/Nodes: Mild cardiomegaly. No significant pericardial fluid/thickening. Left anterior, left circumflex and right coronary atherosclerosis status post CABG with left internal mammary bypass graft. Mitral valve prosthesis is in place. Atherosclerotic nonaneurysmal thoracic aorta. Normal caliber pulmonary arteries. No discrete thyroid nodules. Unremarkable esophagus. No pathologically enlarged axillary, mediastinal or gross hilar lymph nodes, noting limited sensitivity for the detection of hilar adenopathy on this noncontrast study. Lungs/Pleura: No pneumothorax. No pleural effusion. Mild centrilobular emphysema and mild diffuse bronchial wall thickening. Apical right upper lobe 3 mm solid pulmonary nodule (series 4/ image 31) has continued to decrease in size, previously 5 mm on 06/08/2015 using similar  measurement technique. Additional scattered 2-3 mm centrilobular pulmonary nodules in the upper lungs bilaterally are not appreciably changed and are considered benign. No acute consolidative airspace disease or new significant pulmonary nodules. Stable parenchymal band in the anterior left lower lobe. Upper abdomen: Partially visualized bilateral proximal renal artery stents. Musculoskeletal: No aggressive appearing focal osseous lesions. Sternotomy wires appear aligned and intact. IMPRESSION: 1. Continued reduction in the size of the apical right upper lobe pulmonary nodule, now 3 mm, consistent with resolving inflammatory nodule. 2. Mild centrilobular emphysema and mild diffuse bronchial wall thickening, suggesting COPD. No acute pulmonary disease. 3. Three-vessel coronary atherosclerosis status post CABG. Stable mild cardiomegaly. Electronically Signed   By: Ilona Sorrel M.D.   On: 09/21/2015 11:10   I have independently reviewed the above radiology studies  and reviewed the findings with the patient.   Ct Super D Chest Wo Contrast  06/08/2015  CLINICAL DATA:  Followup of right apical pulmonary nodule. Prior CABG and valve replacement. Ex-smoker. EXAM: CT CHEST  WITHOUT CONTRAST TECHNIQUE: Multidetector CT imaging of the chest was performed using thin slice collimation for electromagnetic bronchoscopy planning purposes, without intravenous contrast. COMPARISON:  03/09/2015 and 10/27/2014. FINDINGS: Mediastinum/Lymph Nodes: Aortic and branch vessel atherosclerosis. Moderate cardiomegaly with mitral valve repair. Prior median sternotomy. No mediastinal or definite hilar adenopathy, given limitations of unenhanced CT. Lungs/Pleura: No pleural fluid.  Mild centrilobular emphysema. The right apical complex nodule is less well-defined today. Solid component measures 4 mm on image 17/series 4 versus 5 mm on the prior. nodule in total measures 8 mm today versus 12 mm on the prior. On sagittal reformats today, 4 mm  soft tissue component on image 43 versus 5 mm on the prior. 9 mm overall lesion size today versus 11 mm on the prior (when remeasured). Scarring at the left lung base. Upper abdomen: Normal imaged portions of the liver, spleen, stomach, pancreas, gallbladder, adrenal glands, kidneys. Bilateral renal artery stents. Musculoskeletal: No acute osseous abnormality. IMPRESSION: 1. Decreased size/definition of right apical pulmonary nodule. This suggests resolving infectious or inflammatory etiology. Recommend imaging surveillance with at least 1 more 3- 6 month follow-up. 2.  No acute process in the chest. 3. Cardiomegaly with prior median sternotomy for CABG and mitral valve repair. Electronically Signed   By: Abigail Miyamoto M.D.   On: 06/08/2015 12:37    Ct Chest Wo Contrast  03/09/2015  CLINICAL DATA:  Followup pulmonary nodule. EXAM: CT CHEST WITHOUT CONTRAST TECHNIQUE: Multidetector CT imaging of the chest was performed following the standard protocol without IV contrast. COMPARISON:  Chest CT 10/27/2014 and 08/25/2014 FINDINGS: Mediastinum/Nodes: No breast masses, supraclavicular or axillary lymphadenopathy. The thyroid gland is grossly normal. The heart is normal in size. No pericardial effusion. Stable tortuosity, ectasia and calcifications involving the thoracic aorta. Prosthetic aortic valve is noted along with coronary artery bypass grafts. No mediastinal or hilar mass or adenopathy. The esophagus is grossly normal. Lungs/Pleura: The lungs are clear of acute process. Stable emphysematous changes. Again demonstrated is a right upper lobe pulmonary lesion which is partly solid and partly cystic. The solid component measures slightly larger on the coronal images. It measures 7.5 mm on image number 53 of series 601. It previously measured 6 mm. The cystic component has a thicker ram than on the prior examinations. The largest measurement on the axial images of 11.5 mm and was previously 10.5 mm. Could not  exclude a slow growing neoplasm. PET-CT may be helpful for further evaluation. It would not be an reasonable to continue to follow this with CT with a repeat CT scan in 4-6 months. No new regions.  No acute pulmonary findings.  No pleural effusion. Upper abdomen: No significant upper abdominal findings. Musculoskeletal: No significant bony findings. IMPRESSION: Slight interval increase in size of the right apical lung lesion as described above. Continued CT follow-up (4-6 months) or PET-CT are both reasonable. No new pulmonary lesions or acute pulmonary findings. Stable emphysematous changes. No mediastinal or hilar mass or adenopathy. Electronically Signed   By: Marijo Sanes M.D.   On: 03/09/2015 13:30       CLINICAL DATA: Re-evaluate right upper lobe pulmonary nodule, prior to possible valve surgery. Initial encounter.  EXAM: CT CHEST WITHOUT CONTRAST  TECHNIQUE: Multidetector CT imaging of the chest was performed following the standard protocol without IV contrast.  COMPARISON: CT of the chest performed 08/25/2014  FINDINGS: The small slightly spiculated nodule at the right lung apex is again seen; there is a small associated cystic component which appears to  have increased mildly in size, while the solid component has decreased slightly in size, measuring approximately 5 mm. Given the slight apparent interval decrease in size of the solid component from the recent prior study, this is thought more likely to be postinfectious in nature, though malignancy cannot be entirely excluded.  No additional pulmonary nodules are seen. The lungs are otherwise clear, aside from mild scarring or atelectasis at the left lung base. No pleural effusion or pneumothorax is seen.  Scattered coronary artery calcifications are noted. The mediastinum is otherwise unremarkable. No mediastinal lymphadenopathy is seen. No pericardial effusion is identified. The great vessels are  grossly unremarkable, aside from scattered calcification. Mild calcification is noted along the thoracic aorta. The visualized portions of the thyroid gland are unremarkable. No axillary lymphadenopathy is appreciated.  No acute osseous abnormalities are identified.  IMPRESSION: 1. Small slightly spiculated nodule at the right lung apex is again seen. It has a small cystic component which appears to have increased mildly in size, while the solid component has decreased slightly in size, measuring approximately 5 mm. Given the slight apparent interval decrease in size of the solid component from the recent prior CT, this is thought more likely to be postinfectious in nature, though malignancy cannot be entirely excluded. As this remains too small to further characterize, would recommend strict follow-up CT of the chest in 3-6 months, to ensure stability. 2. Mild scarring or atelectasis at the left lung base. 3. Scattered coronary artery calcifications seen.   Electronically Signed  By: Garald Balding M.D.  On: 10/27/2014 19:40  Recent Lab Findings: Lab Results  Component Value Date   WBC 12.1* 12/09/2014   HGB 8.5* 12/09/2014   HCT 25.9* 12/09/2014   PLT 88* 12/09/2014   GLUCOSE 88 12/09/2014   CHOL 181 08/24/2014   TRIG 88 08/24/2014   HDL 33* 08/24/2014   LDLDIRECT 185* 09/09/2011   LDLCALC 130* 08/24/2014   ALT 12* 11/28/2014   AST 16 11/28/2014   NA 136 12/09/2014   K 3.4* 12/09/2014   CL 101 12/09/2014   CREATININE 0.99 12/09/2014   BUN 8 12/09/2014   CO2 25 12/09/2014   TSH 1.756 11/28/2014   INR 1.8 02/27/2015   HGBA1C 6.3* 12/02/2014   ECHO: 03/10/2015 Study Conclusions  - Left ventricle: The cavity size was normal. Wall thickness was normal. Systolic function was mildly reduced. The estimated ejection fraction was in the range of 45% to 50%. There is hypokinesis of the basal-mid septal myocardium. - Aortic valve: There was moderate  regurgitation. - Mitral valve: A bioprosthesis was present. - Left atrium: The atrium was mildly dilated.  Impressions:  - Basal septal hypokinesis with mildly reduced LV function; aortic valve not well visualized but probable moderate AI (2+); s/p MVR with no obvious MR; trace TR.  Transthoracic echocardiography. M-mode, complete 2D, spectral Doppler, and color Doppler. Birthdate: Patient birthdate: 03/24/1951. Age: Patient is 65 yr old. Sex: Gender: female. BMI: 23.9 kg/m^2. Blood pressure:   106/64 Patient status: Outpatient. Study date: Study date: 03/10/2015. Study time: 02:26 PM. Location: [facility]  -------------------------------------------------------------------  ------------------------------------------------------------------- Left ventricle: The cavity size was normal. Wall thickness was normal. Systolic function was mildly reduced. The estimated ejection fraction was in the range of 45% to 50%. Regional wall motion abnormalities: There is hypokinesis of the basal-mid septal myocardium.  ------------------------------------------------------------------- Aortic valve: Poorly visualized. Doppler: Transvalvular velocity was within the normal range. There was no stenosis. There was moderate regurgitation.  ------------------------------------------------------------------- Aorta: Aortic  root: The aortic root was normal in size.  ------------------------------------------------------------------- Mitral valve: A bioprosthesis was present. Doppler: There was no regurgitation.  Peak gradient (D): 8 mm Hg.  ------------------------------------------------------------------- Left atrium: The atrium was mildly dilated.  ------------------------------------------------------------------- Right ventricle: The cavity size was normal. Systolic function  was normal.  ------------------------------------------------------------------- Pulmonic valve:  Doppler: Transvalvular velocity was within the normal range. There was no evidence for stenosis.  ------------------------------------------------------------------- Tricuspid valve:  Structurally normal valve.  Doppler: Transvalvular velocity was within the normal range. There was trivial regurgitation.  ------------------------------------------------------------------- Pulmonary artery:  Systolic pressure was within the normal range.  ------------------------------------------------------------------- Right atrium: The atrium was normal in size.  ------------------------------------------------------------------- Pericardium: There was no pericardial effusion.  ------------------------------------------------------------------- Systemic veins: Inferior vena cava: The vessel was normal in size.   Assessment / Plan: In June 2016 she was noted to have a very small right upper lobe lung nodule preoperatively a repeat scan was done in August, that showed the solid component to decrease in size.  On today's CT of the chest Decreased size/definition of right apical pulmonary nodule. This suggests resolving infectious or inflammatory etiology   Doing well following coronary artery bypass grafting and mitral valve replacement for rheumatic valvular disease Coumadin has been stopped by Cardiology, 3 months post op with tissue mitral  valve and sinus rhythm  With the patient's significant smoking history and family history of lung cancer we will refer her to the lung cancer screening program for further CT scans of the chest next year.  I've not made a specific appointment for her to see me she is closely followed by cardiology for her valve and coronary disease but would be glad to see her at anytime as needed.  She was reminded of the precautions concerning prosthetic valve and  endocarditis.    [name] MD      [address] [place] [address] Office [phone]   Beeper [phone]  09/21/2015 12:39 PM

## 2015-09-22 ENCOUNTER — Other Ambulatory Visit: Payer: Self-pay | Admitting: *Deleted

## 2015-09-22 MED ORDER — TRAMADOL HCL 50 MG PO TABS: 50.0000 mg | ORAL_TABLET | Freq: Four times a day (QID) | ORAL | Status: DC | PRN

## 2015-09-22 NOTE — Telephone Encounter (Signed)
Patient informed, she is also requesting a refill on metoprolol.

## 2015-09-22 NOTE — Telephone Encounter (Signed)
Patient requesting refill on metoprolol.

## 2015-09-22 NOTE — Telephone Encounter (Signed)
Attempted to call in tramadol. Pharmacy currently closed.

## 2015-09-22 NOTE — Telephone Encounter (Signed)
Please let the pt know I called in her rx for tramadol to the [facility] on [address]. Please advise her to keep her appt with me with this upcoming month.  Thanks, [employer]

## 2015-09-25 MED ORDER — METOPROLOL TARTRATE 25 MG PO TABS: 12.5000 mg | ORAL_TABLET | Freq: Two times a day (BID) | ORAL | Status: DC

## 2015-09-25 NOTE — Telephone Encounter (Signed)
Received a refill request for metoprolol.  Rx refill was approved on 09/22/15, but stated no print.  Rx resent electronically.  Also received another refill request for tramadol.  Derl Barrow, RN

## 2015-09-25 NOTE — Telephone Encounter (Addendum)
I called in the Rx for tramadol on 6/30. Spoke with pharmacy who notes that they do have the RX and it was already on file. Unsure why another request was sent in for tramadol but they stated it wasn't necessary.  Archie Patten, MD [facility] Resident  09/25/2015, 5:17 PM

## 2015-09-25 NOTE — Addendum Note (Signed)
Addended by: Derl Barrow on: 09/25/2015 04:55 PM   Modules accepted: Orders

## 2015-10-13 ENCOUNTER — Other Ambulatory Visit: Payer: Self-pay | Admitting: Acute Care

## 2015-10-13 DIAGNOSIS — F17210 Nicotine dependence, cigarettes, uncomplicated: Secondary | ICD-10-CM

## 2015-10-23 ENCOUNTER — Ambulatory Visit (INDEPENDENT_AMBULATORY_CARE_PROVIDER_SITE_OTHER): Payer: Medicaid Other | Admitting: Family Medicine

## 2015-10-23 DIAGNOSIS — I10 Essential (primary) hypertension: Secondary | ICD-10-CM | POA: Diagnosis not present

## 2015-10-23 DIAGNOSIS — M545 Low back pain, unspecified: Secondary | ICD-10-CM

## 2015-10-23 DIAGNOSIS — G4700 Insomnia, unspecified: Secondary | ICD-10-CM | POA: Diagnosis present

## 2015-10-23 MED ORDER — TRAZODONE HCL 100 MG PO TABS: 100.0000 mg | ORAL_TABLET | Freq: Every evening | ORAL | 2 refills | Status: DC | PRN

## 2015-10-23 MED ORDER — TRAMADOL HCL 50 MG PO TABS: 50.0000 mg | ORAL_TABLET | Freq: Four times a day (QID) | ORAL | 0 refills | Status: DC | PRN

## 2015-10-23 MED ORDER — CYCLOBENZAPRINE HCL 10 MG PO TABS: ORAL_TABLET | ORAL | 2 refills | Status: DC

## 2015-10-23 MED ORDER — ZOSTER VACCINE LIVE 19400 UNT/0.65ML ~~LOC~~ SUSR: 0.6500 mL | Freq: Once | SUBCUTANEOUS | 0 refills | Status: AC

## 2015-10-23 MED ORDER — METOPROLOL TARTRATE 25 MG PO TABS: 25.0000 mg | ORAL_TABLET | Freq: Two times a day (BID) | ORAL | 2 refills | Status: DC

## 2015-10-23 NOTE — Assessment & Plan Note (Signed)
No red flags on exam or history. I advised the patient to consider going to physical therapy so that they can teacher more exercises in the joint that she is doing them correctly. Patient voiced understanding. Pain contract signed today. Obtained urine for UDS, unfortunately there was not enough urine to run the UDS. -Refill tramadol and Flexeril.

## 2015-10-23 NOTE — Patient Instructions (Signed)
Increase the metoprolol so you are taking 25 mg twice daily (1 tablet in the morning and one in the evening). See if the physical therapist has other exercises to show you. Follow-up with me in 3 months or sooner as needed.

## 2015-10-23 NOTE — Progress Notes (Signed)
Patient ID: Leah [name], female   DOB: 1950-09-03, 65 y.o.   MRN: [id number]    Subjective: CC:  Pain f/u HPI: Patient is a 65 y.o. female with a past medical history of rheumatic disease, HTN, CAD presenting to clinic today for a pain follow up. She is s/p MVR with pericardial tissue valve and CABG x 1 with left IMA to the LAD on 9/12.   Concerns of depression:  Doing a lot better.  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 Sleeping better. Appetite is stable. No suicidal ideation of self harm. No AVH PHQ-9, 11, not difficult at all.    Hypertension: Taking BP meds at home. Ranges SBPs 200-130. No side effects from medications. No headaches, blurred vision, SOB, chest pain.    Lumbar pain Pain pretty well controlled currently with tramadol. She's been doing home exercises. She was referred to PT but hasn't gone because she thought it would be the same exercises that I gave her in clinic.   Patient denies radicular pain. No urinary or bowel incontinence. No saddle paresthesias.   Social History: started back smoking; 10 cigs/day.   Health Maintenance: patient aware she's due for multiple things such as colonoscopy and mammogram. Rx for herpes zoster vaccine given today.   ROS: All other systems reviewed and are negative besides that noted in HPI.  Past Medical History Patient Active Problem List   Diagnosis Date Noted  . Depressed mood 07/27/2015  . Weakness   . Long-term (current) use of anticoagulants 12/14/2014  . S/P MVR (mitral valve replacement) 12/05/2014  . Moderate aortic regurgitation 10/26/2014  . Renal vascular disease 10/26/2014  . Acute on chronic diastolic congestive heart failure ([place]) 10/21/2014  . Tobacco use 10/21/2014  . Troponin level elevated-(felt to be from CHF) 10/21/2014  . Diastolic dysfunction, grade 2 by echo June 2016 10/21/2014  . Carpal tunnel syndrome 10/17/2014  . Left shoulder pain 09/17/2014  . Pulmonary  nodule, right-needs repeat CT in Dec 2016 09/17/2014  . Abnormal CXR 08/25/2014  . Severe mitral regurgitation 08/25/2014  . Unstable angina ([place]) 08/22/2014  . Hematuria 07/01/2014  . Low back pain 07/05/2012  . Insomnia 07/05/2012  . PVC (premature ventricular contraction) 10/02/2011  . Fatigue 09/04/2011  . Chest pain 09/04/2011  . History of angioedema with ACE 2013 06/14/2011  . PVD- s/p multiple proceedures 06/04/2011  . Hyperlipidemia 09/28/2008  . Essential hypertension, benign 09/28/2008  . CAD S/P LAD DES 2009 with 70% ISR 08/24/14 09/28/2008     Medications- reviewed and updated Current Outpatient Prescriptions  Medication Sig Dispense Refill  . albuterol (PROVENTIL HFA;VENTOLIN HFA) 108 (90 BASE) MCG/ACT inhaler Inhale 2 puffs into the lungs every 6 (six) hours as needed for wheezing or shortness of breath. 1 Inhaler 0  . amLODipine (NORVASC) 5 MG tablet TAKE TWO TABLETS BY MOUTH ONCE DAILY 60 tablet 5  . aspirin EC 81 MG tablet Take 1 tablet (81 mg total) by mouth daily.    Marland Kitchen atorvastatin (LIPITOR) 80 MG tablet Take 1 tablet (80 mg total) by mouth daily. 30 tablet 2  . cyclobenzaprine (FLEXERIL) 10 MG tablet TAKE 1 TABLET BY MOUTH 3 TIMES DAILY AS NEEDED FOR MUSCLE SPASMS 70 tablet 2  . metoprolol tartrate (LOPRESSOR) 25 MG tablet Take 1 tablet (25 mg total) by mouth 2 (two) times daily. 30 tablet 2  . nicotine (NICODERM CQ - DOSED IN MG/24 HOURS) 14 mg/24hr patch Place 1 patch (14 mg  total) onto the skin daily. 28 patch 2  . nitroGLYCERIN (NITROSTAT) 0.4 MG SL tablet Place 1 tablet (0.4 mg total) under the tongue every 5 (five) minutes x 3 doses as needed for chest pain. 25 tablet 12  . traMADol (ULTRAM) 50 MG tablet Take 1-2 tablets (50-100 mg total) by mouth every 6 (six) hours as needed. for pain 30 tablet 0  . traZODone (DESYREL) 100 MG tablet Take 1 tablet (100 mg total) by mouth at bedtime as needed for sleep. 30 tablet 2  . Zoster Vaccine Live, PF, (ZOSTAVAX) 09326  UNT/0.65ML injection Inject 19,400 Units into the skin once. 1 each 0   No current facility-administered medications for this visit.     Objective: Office vital signs reviewed. BP (!) 180/79 (BP Location: Left Arm, Patient Position: Sitting, Cuff Size: Normal)   Pulse 74   Temp 97.8 F (36.6 C) (Oral)   Wt 181 lb (82.1 kg)   BMI 23.56 kg/m  Repeat 178/76   Physical Examination:  General: Awake, alert, well- nourished, NAD Cardio: Regular rate and rhythm. II/VI systolic murmur. Well healed scar.  Pulm: No increased WOB.  CTAB, without wheezes, rhonchi or crackles noted. 2+ DP pulses MSK: TTP over left lumbar paraspinal muscles, mild tenderness located on the right. No drop-offs noted. Negative SLR. 5/5 strength in the UE and LE bilaterally. Normal gait and station.      Assessment/Plan: Essential hypertension, benign BP elevated today on recheck. She seems to be just at goal when she sees CVTS. Will increase metoprolol to '25mg'$  BID (she was taking 12.'5mg'$  in AM and '25mg'$  in the PM).  - continue amlodipine - continue to monitor. - f/u in 3 months.  Low back pain No red flags on exam or history. I advised the patient to consider going to physical therapy so that they can teacher more exercises in the joint that she is doing them correctly. Patient voiced understanding. Pain contract signed today. Obtained urine for UDS, unfortunately there was not enough urine to run the UDS. -Refill tramadol and Flexeril.  Give Rx for Zostavaz. Pt prefers to wait on Hep C and HIV testing until our next blood draw.  Archie Patten PGY-3, [place]

## 2015-10-23 NOTE — Assessment & Plan Note (Signed)
BP elevated today on recheck. She seems to be just at goal when she sees CVTS. Will increase metoprolol to '25mg'$  BID (she was taking 12.'5mg'$  in AM and '25mg'$  in the PM).  - continue amlodipine - continue to monitor. - f/u in 3 months.

## 2015-11-08 ENCOUNTER — Inpatient Hospital Stay: Admission: RE | Admit: 2015-11-08 | Payer: Medicaid Other | Source: Ambulatory Visit

## 2015-11-08 ENCOUNTER — Encounter: Payer: Medicaid Other | Admitting: Acute Care

## 2015-11-16 ENCOUNTER — Telehealth: Payer: Self-pay | Admitting: Acute Care

## 2015-11-16 NOTE — Telephone Encounter (Signed)
Referral was canceled on 10/02/15 for the lung cancer screening program due to not meeting program requirements. I sent notification to Dr. Everrett Coombe office via fax on 10/02/15. Sent form to be scanned into pt's chart. Received form back on 11/10/15 stating that it could not be scanned into chart. Nothing further needed.

## 2015-11-21 ENCOUNTER — Telehealth: Payer: Self-pay | Admitting: *Deleted

## 2015-11-21 NOTE — Telephone Encounter (Signed)
Patient calling requesting refill on tramadol.

## 2015-11-22 MED ORDER — TRAMADOL HCL 50 MG PO TABS: 50.0000 mg | ORAL_TABLET | Freq: Four times a day (QID) | ORAL | 0 refills | Status: DC | PRN

## 2015-11-22 NOTE — Telephone Encounter (Signed)
Please let the patient know that a Rx for tramadol was sent into the pharmacy.  Thanks, Archie Patten, MD [facility] Resident  11/22/2015, 3:01 PM

## 2015-11-22 NOTE — Telephone Encounter (Signed)
Pt informed. Karissa Meenan, CMA  

## 2015-12-21 ENCOUNTER — Other Ambulatory Visit: Payer: Self-pay | Admitting: Family Medicine

## 2015-12-21 NOTE — Telephone Encounter (Signed)
Refill request for Tramadol 

## 2015-12-22 ENCOUNTER — Other Ambulatory Visit: Payer: Self-pay | Admitting: *Deleted

## 2015-12-22 MED ORDER — TRAMADOL HCL 50 MG PO TABS: 50.0000 mg | ORAL_TABLET | Freq: Four times a day (QID) | ORAL | 0 refills | Status: DC | PRN

## 2015-12-22 NOTE — Telephone Encounter (Signed)
Tramadol called in. Please have pt f/u with me in the next month.  Archie Patten, MD [facility] Resident  12/22/2015, 9:32 AM

## 2015-12-25 MED ORDER — METOPROLOL TARTRATE 25 MG PO TABS: 25.0000 mg | ORAL_TABLET | Freq: Two times a day (BID) | ORAL | 2 refills | Status: DC

## 2015-12-28 NOTE — Telephone Encounter (Signed)
Patient has appointment scheduled for 10/11.

## 2016-01-03 ENCOUNTER — Ambulatory Visit: Payer: Medicaid Other | Admitting: Family Medicine

## 2016-01-15 ENCOUNTER — Encounter: Payer: Self-pay | Admitting: Family Medicine

## 2016-01-15 ENCOUNTER — Ambulatory Visit (INDEPENDENT_AMBULATORY_CARE_PROVIDER_SITE_OTHER): Payer: Medicaid Other | Admitting: Family Medicine

## 2016-01-15 VITALS — BP 142/72 | HR 74 | Ht 74.0 in | Wt 188.0 lb

## 2016-01-15 DIAGNOSIS — I10 Essential (primary) hypertension: Secondary | ICD-10-CM

## 2016-01-15 DIAGNOSIS — Z1159 Encounter for screening for other viral diseases: Secondary | ICD-10-CM

## 2016-01-15 DIAGNOSIS — D696 Thrombocytopenia, unspecified: Secondary | ICD-10-CM

## 2016-01-15 DIAGNOSIS — H547 Unspecified visual loss: Secondary | ICD-10-CM

## 2016-01-15 DIAGNOSIS — R229 Localized swelling, mass and lump, unspecified: Secondary | ICD-10-CM | POA: Diagnosis present

## 2016-01-15 DIAGNOSIS — Z23 Encounter for immunization: Secondary | ICD-10-CM

## 2016-01-15 DIAGNOSIS — Z720 Tobacco use: Secondary | ICD-10-CM

## 2016-01-15 DIAGNOSIS — M545 Low back pain, unspecified: Secondary | ICD-10-CM

## 2016-01-15 DIAGNOSIS — Z114 Encounter for screening for human immunodeficiency virus [HIV]: Secondary | ICD-10-CM

## 2016-01-15 DIAGNOSIS — R7303 Prediabetes: Secondary | ICD-10-CM

## 2016-01-15 DIAGNOSIS — G8929 Other chronic pain: Secondary | ICD-10-CM

## 2016-01-15 DIAGNOSIS — D2221 Melanocytic nevi of right ear and external auricular canal: Secondary | ICD-10-CM | POA: Diagnosis not present

## 2016-01-15 LAB — CBC
HCT: 41.7 % (ref 35.0–45.0)
Hemoglobin: 14 g/dL (ref 11.7–15.5)
MCH: 26.1 pg — ABNORMAL LOW (ref 27.0–33.0)
MCHC: 33.6 g/dL (ref 32.0–36.0)
MCV: 77.7 fL — ABNORMAL LOW (ref 80.0–100.0)
PLATELETS: 170 10*3/uL (ref 140–400)
RBC: 5.37 MIL/uL — ABNORMAL HIGH (ref 3.80–5.10)
RDW: 15.2 % — AB (ref 11.0–15.0)
WBC: 11.1 10*3/uL — AB (ref 3.8–10.8)

## 2016-01-15 LAB — HIV ANTIBODY (ROUTINE TESTING W REFLEX): HIV: NONREACTIVE

## 2016-01-15 LAB — POCT GLYCOSYLATED HEMOGLOBIN (HGB A1C): HEMOGLOBIN A1C: 5.6

## 2016-01-15 LAB — HEPATITIS C ANTIBODY: HCV AB: NEGATIVE

## 2016-01-15 MED ORDER — TRAMADOL HCL 50 MG PO TABS: 50.0000 mg | ORAL_TABLET | Freq: Four times a day (QID) | ORAL | 1 refills | Status: DC | PRN

## 2016-01-15 NOTE — Patient Instructions (Signed)
It was good to see you. I've referred you to the eye doctor. Ive referred you to the dermatologist about having that mole removed. If your BP on repeat was > 150/90, start taking metoprolol '50mg'$  in the morning and at night. I have referred you to PT for you lower back pain. I have also refilled your tramadol.

## 2016-01-15 NOTE — Progress Notes (Signed)
Patient ID: Leah [name], female   DOB: [birth year], 65 y.o.   MRN: [id number]    Subjective: CC:  Pain f/u HPI: Patient is a 65 y.o. female with a past medical history of rheumatic disease, HTN, CAD presenting to clinic today for a pain follow up. She is s/p MVR with pericardial tissue valve and CABG x 1 with left IMA to the LAD on 9/12.   Decreased vision: last got new glasses, in 2010, or 2011. Notes it hard is seeing up close. She notes it worse on the L then the right. Notes the L eye may have film in the AM.  Sometimes has pain when straining to see. Feels vision is blurred. No foreign body sensation, no redness. Would like a referral for the eye doctor.   Hypertension: Taking BP meds at home. Ranges BP 160-150/80. No side effects from medications. No headaches, blurred vision, SOB, chest pain.   Lumbar pain Pain pretty well controlled currently with tramadol. Notes it is mostly in the lumbar region L>R.  She's been doing home exercises. She has not gone to PT.  Feels all her issues are due to her height.  Patient denies radicular pain. No urinary or bowel incontinence. No saddle paresthesias.  Mole: present on R ear. Notes it has been progressively getting larger over the last few years. Denies any change in appearance besides getting bigger. Notes it is pruritic at times. Denies bleeding or pain. Would like this removed due to its size and irritation.    Social History: back smoking, up to 0.5-1ppd.   Health Maintenance: patient aware she's due for multiple things such as colonoscopy and mammogram. Received flu vaccine, hep C and HIV screening today.   ROS: All other systems reviewed and are negative besides that noted in HPI.   Past Medical History Patient Active Problem List   Diagnosis Date Noted  . Skin mass 01/17/2016  . Decreased visual acuity 01/17/2016  . Depressed mood 07/27/2015  . Weakness   . Long-term (current) use of anticoagulants 12/14/2014  . S/P MVR  (mitral valve replacement) 12/05/2014  . Moderate aortic regurgitation 10/26/2014  . Renal vascular disease 10/26/2014  . Acute on chronic diastolic congestive heart failure ([place]) 10/21/2014  . Tobacco use 10/21/2014  . Diastolic dysfunction, grade 2 by echo June 2016 10/21/2014  . Carpal tunnel syndrome 10/17/2014  . Left shoulder pain 09/17/2014  . Pulmonary nodule, right-needs repeat CT in Dec 2016 09/17/2014  . Abnormal CXR 08/25/2014  . Severe mitral regurgitation 08/25/2014  . Unstable angina ([place]) 08/22/2014  . Hematuria 07/01/2014  . Low back pain 07/05/2012  . Insomnia 07/05/2012  . PVC (premature ventricular contraction) 10/02/2011  . Fatigue 09/04/2011  . Chest pain 09/04/2011  . History of angioedema with ACE 2013 06/14/2011  . PVD- s/p multiple proceedures 06/04/2011  . Hyperlipidemia 09/28/2008  . Essential hypertension, benign 09/28/2008  . CAD S/P LAD DES 2009 with 70% ISR 08/24/14 09/28/2008     Medications- reviewed and updated  Objective: Office vital signs reviewed. BP (!) 142/72   Pulse 74   Ht '6\' 2"'$  (1.88 m)   Wt 188 lb (85.3 kg)   BMI 24.14 kg/m  Repeat 178/76   Physical Examination:  General: Awake, alert, well- nourished, NAD Cardio: Regular rate and rhythm. II/VI systolic murmur. Well healed scar.  Pulm: No increased WOB.  CTAB, without wheezes, rhonchi or crackles noted. 2+ DP pulses MSK: no tenderness to palpation over the left lumbar paraspinal muscles, mild  tenderness located on the right. Pain with flexion, improved with extension.  No drop-offs noted. Negative SLR. 5/5 strength in the UE and LE bilaterally. Normal gait and station.  Skin: large 1x 1.2cm dark pedunculated growth over the helix of the R ear.  Vision: reported decreased bilaterally, more prominent on the L than the right, however not documented by CMA in chart that I can see.   Assessment/Plan: Tobacco use Discussed importance of reduction of stress (stated cause for  re-starting smoking) and smoking cessation. Currently in pre-contemplation stage.     Low back pain No red flags on exam or history. We discussed posture (especially given her height) and back strengthening exercises. Given her symptoms improve with extension, she would probably benefit from the [place] method of PT.  Pain contract signed on 10/23/15. Issues with enough specimen to run UDS previously (not noted until after encounter).  - referral back to PT- pt states she will go this time.' - refilled tramadol and flexeril.  - will need to get UDS at next visit (reminder placed in chart).   Essential hypertension, benign Initial BP elevated, however repeat at goal per JNC 8 guidelines.  - continue metoprolol '25mg'$  BID and amlodipine  - repeat BMET today.   Skin mass Patient noted to have a 1x1.2cm partially pedunculated hyperpigmented lesion over the R ear. This has always been covered up by her hair per her report, unknown amount of sun exposure. Discussed that given the size and location, I would refer to dermatology for removal given her bothersome symptoms (pruritis). - referral to derm   Decreased visual acuity Patient's last eye exam was 6-7 years ago, meaning she has not had new glasses since that time. - referral to ophthalmology.   Has a Rx for Zostavax, states [facility] notes Medicaid will not reimburse them for the vaccine so they wont give it to her. Hep C and HIV screening done today. Flu vaccine given today. Will need an annual exam for things such as pap smear  Archie Patten PGY-3, [place]

## 2016-01-16 LAB — LIPID PANEL
Cholesterol: 264 mg/dL — ABNORMAL HIGH (ref 125–200)
HDL: 47 mg/dL (ref >=46)
LDL Cholesterol: 193 mg/dL — ABNORMAL HIGH (ref <130)
Total CHOL/HDL Ratio: 5.6 Ratio — ABNORMAL HIGH (ref <=5.0)
Triglycerides: 118 mg/dL (ref <150)
VLDL: 24 mg/dL (ref <30)

## 2016-01-16 LAB — BASIC METABOLIC PANEL WITH GFR
BUN: 12 mg/dL (ref 7–25)
CALCIUM: 9.7 mg/dL (ref 8.6–10.4)
CO2: 26 mmol/L (ref 20–31)
Chloride: 107 mmol/L (ref 98–110)
Creat: 0.99 mg/dL (ref 0.50–0.99)
GFR, EST AFRICAN AMERICAN: 70 mL/min (ref >=60)
GFR, Est Non African American: 61 mL/min (ref >=60)
GLUCOSE: 84 mg/dL (ref 65–99)
Potassium: 3.7 mmol/L (ref 3.5–5.3)
Sodium: 141 mmol/L (ref 135–146)

## 2016-01-17 DIAGNOSIS — R229 Localized swelling, mass and lump, unspecified: Secondary | ICD-10-CM | POA: Insufficient documentation

## 2016-01-17 DIAGNOSIS — H547 Unspecified visual loss: Secondary | ICD-10-CM | POA: Insufficient documentation

## 2016-01-17 NOTE — Assessment & Plan Note (Signed)
Patient's last eye exam was 6-7 years ago, meaning she has not had new glasses since that time. - referral to ophthalmology.

## 2016-01-17 NOTE — Assessment & Plan Note (Signed)
Patient noted to have a 1x1.2cm partially pedunculated hyperpigmented lesion over the R ear. This has always been covered up by her hair per her report, unknown amount of sun exposure. Discussed that given the size and location, I would refer to dermatology for removal given her bothersome symptoms (pruritis). - referral to derm

## 2016-01-17 NOTE — Assessment & Plan Note (Signed)
Initial BP elevated, however repeat at goal per JNC 8 guidelines.  - continue metoprolol '25mg'$  BID and amlodipine  - repeat BMET today.

## 2016-01-17 NOTE — Assessment & Plan Note (Signed)
Discussed importance of reduction of stress (stated cause for re-starting smoking) and smoking cessation. Currently in pre-contemplation stage.

## 2016-01-17 NOTE — Assessment & Plan Note (Addendum)
No red flags on exam or history. We discussed posture (especially given her height) and back strengthening exercises. Given her symptoms improve with extension, she would probably benefit from the [place] method of PT.  Pain contract signed on 10/23/15. Issues with enough specimen to run UDS previously (not noted until after encounter).  - referral back to PT- pt states she will go this time.' - refilled tramadol and flexeril.  - will need to get UDS at next visit (reminder placed in chart).

## 2016-01-20 ENCOUNTER — Other Ambulatory Visit: Payer: Self-pay | Admitting: Family Medicine

## 2016-01-20 DIAGNOSIS — M545 Low back pain, unspecified: Secondary | ICD-10-CM

## 2016-01-22 ENCOUNTER — Encounter: Payer: Self-pay | Admitting: Family Medicine

## 2016-01-23 ENCOUNTER — Encounter: Payer: Self-pay | Admitting: Physical Therapy

## 2016-01-23 ENCOUNTER — Ambulatory Visit: Payer: Medicaid Other | Attending: Family Medicine | Admitting: Physical Therapy

## 2016-01-23 DIAGNOSIS — M545 Low back pain: Secondary | ICD-10-CM | POA: Insufficient documentation

## 2016-01-23 DIAGNOSIS — G8929 Other chronic pain: Secondary | ICD-10-CM | POA: Diagnosis present

## 2016-01-23 DIAGNOSIS — M62838 Other muscle spasm: Secondary | ICD-10-CM | POA: Insufficient documentation

## 2016-01-23 NOTE — Patient Instructions (Addendum)

## 2016-01-23 NOTE — Therapy (Signed)
[place], [place], [address] Phone: [phone]   Fax:  [phone]  Physical Therapy Evaluation  Patient Details  Name: MARIVEL MCCLARTY MRN: [id number] Date of Birth: 02-Sep-1950 Referring Provider: Kinnie Feil, MD  Encounter Date: 01/23/2016      PT End of Session - 01/23/16 1228    Visit Number 1   Number of Visits 1   Date for PT Re-Evaluation 01/24/16   Authorization Type Medicaid 1 time evaluation   PT Start Time 1146   PT Stop Time 1227   PT Time Calculation (min) 41 min   Activity Tolerance Patient tolerated treatment well   Behavior During Therapy [facility] for tasks assessed/performed      Past Medical History:  Diagnosis Date  . Anemia   . CAD (coronary artery disease)   . Carotid artery occlusion   . COPD (chronic obstructive pulmonary disease) ([place])   . GERD (gastroesophageal reflux disease)   . Headache(784.0)   . Hyperlipidemia   . Hypertension   . Leg pain   . Peripheral vascular disease ([place])   . Renal vascular disease 10/26/2014   bilateral stents placed   . Severe mitral regurgitation   . Shortness of breath dyspnea   . Tobacco abuse     Past Surgical History:  Procedure Laterality Date  . ABDOMINAL HYSTERECTOMY    . ANGIOPLASTY / STENTING ILIAC  2010   right external iliac by Dr. Irish Lack  . CARDIAC CATHETERIZATION  11/13/11   Left Heart Cath. with Coronary Angiogram  . CARDIAC CATHETERIZATION N/A 08/24/2014   Procedure: Left Heart Cath and Coronary Angiography;  Surgeon: Wellington Hampshire, MD;  Location: [facility] [place] CV [facility];  Service: Cardiovascular;  Laterality: N/A;  . CARDIAC CATHETERIZATION N/A 10/26/2014   Procedure: Right/Left Heart Cath and Coronary Angiography;  Surgeon: Peter M Martinique, MD; oLAD 70%, mLAD 70% ISR, D2 30%, OFC 30%, RCA 20%, EF nl, low R heart pressures after diuresis, severe MR  . CAROTID ENDARTERECTOMY  11/21/2007   left  . CORONARY ANGIOPLASTY  WITH STENT PLACEMENT  6/09   LAD 2.5x12 Prom[place]  . CORONARY ARTERY BYPASS GRAFT N/A 12/05/2014   Procedure: CORONARY ARTERY BYPASS GRAFTING (CABG);  Surgeon: [name], MD;  Location: [facility];  Service: Open Heart Surgery;  Laterality: N/A;  Times 1 using left internal mammary artery to LAD  . FEMORAL-POPLITEAL BYPASS GRAFT  10/12   left Dr. Kellie Simmering  . MITRAL VALVE REPLACEMENT N/A 12/05/2014   Procedure: MITRAL VALVE (MV) REPLACEMENT;  Surgeon: [name], MD;  Location: [facility];  Service: Open Heart Surgery;  Laterality: N/A;  Closure left atrial appendage  . RENAL ARTERY STENT Bilateral   . TEE WITHOUT CARDIOVERSION N/A 10/24/2014   Procedure: TRANSESOPHAGEAL ECHOCARDIOGRAM (TEE);  Surgeon: Thayer Headings, MD;  Location: [facility];  Service: Cardiovascular;  Laterality: N/A;  . TEE WITHOUT CARDIOVERSION N/A 12/05/2014   Procedure: TRANSESOPHAGEAL ECHOCARDIOGRAM (TEE);  Surgeon: [name], MD;  Location: [place] [facility];  Service: Open Heart Surgery;  Laterality: N/A;    There were no vitals filed for this visit.       Subjective Assessment - 01/23/16 1152    Subjective pt is a 65 y.o F with CC of L low back pain with inttermittent referral down the LLE to the lateral ankle that started about 2 years ago that started suddenly with no traumatic injury, reports some pain in the back intermittently when she was working.  reports more pain and denies N/T in the back and LLE. denies bowel or bladder incontinence, or weakness in the legs.     How long can you sit comfortably? 1 hour   How long can you stand comfortably? 20 min   How long can you walk comfortably? 30 min   Diagnostic tests x-ray on lumbar spine   Patient Stated Goals decrease pain, improve posture,    Currently in Pain? Yes   Pain Score 7    Pain Location Back   Pain Orientation Left   Pain Descriptors / Indicators Aching;Sore   Pain Type Chronic pain   Pain Radiating Towards to the LLE to the lateral ankle   Pain  Onset More than a month ago   Pain Frequency Intermittent   Aggravating Factors  prolonged standing, bending forward,    Pain Relieving Factors medication, laying down, sitting up straight,             [facility] PT Assessment - 01/23/16 1157      Assessment   Medical Diagnosis Low back pain    Referring Provider Kinnie Feil, MD   Onset Date/Surgical Date --  2 years ago   Hand Dominance Right   Next MD Visit --  2 months   Prior Therapy no     Precautions   Precautions None     Restrictions   Weight Bearing Restrictions No     Balance Screen   Has the patient fallen in the past 6 months No   Has the patient had a decrease in activity level because of a fear of falling?  No   Is the patient reluctant to leave their home because of a fear of falling?  No     Home Social worker Private residence   Living Arrangements Other relatives;Children   Available Help at Discharge Available PRN/intermittently   Type of Home Apartment   Home Access Stairs to enter   Entrance Stairs-Number of Steps 4   Entrance Stairs-Rails Right   Home Layout Two level   Alternate Level Stairs-Number of Steps 12   Alternate Level Stairs-Rails Right     Prior Function   Level of Independence Independent;Independent with basic ADLs   Vocation Retired     Associate Professor   Overall Cognitive Status Within Functional Limits for tasks assessed     Posture/Postural Control   Posture/Postural Control Postural limitations   Postural Limitations Rounded Shoulders;Forward head     ROM / Strength   AROM / PROM / Strength AROM;Strength     AROM   AROM Assessment Site Lumbar   Lumbar Flexion 70   Lumbar Extension 20   Lumbar - Right Side Bend 20   Lumbar - Left Side Bend 10     Strength   Strength Assessment Site Hip;Knee   Right/Left Hip Right;Left   Right Hip Flexion 4-/5   Right Hip Extension 3+/5   Right Hip ABduction 3+/5   Right Hip ADduction 4/5   Left Hip Flexion  4-/5   Left Hip Extension 3+/5   Left Hip ABduction 3/5   Left Hip ADduction 4/5   Right/Left Knee Right;Left   Right Knee Flexion 4/5   Right Knee Extension 4/5   Left Knee Flexion 4-/5   Left Knee Extension 4-/5     Palpation   Palpation comment tightness in the L quadratus lumborum      Special Tests    Special Tests Lumbar   Lumbar  Tests Slump Test;Prone Knee Bend Test;Straight Leg Raise     Slump test   Findings Negative   Side --  bil     Prone Knee Bend Test   Findings Negative   Side --  bil     Straight Leg Raise   Findings Negative   Side  --  bil   Comment tightness on the L                           PT Education - 01/23/16 1227    Education provided Yes   Education Details evaluation findings, HEP with proper form and rationale, posture education, anatomy of lumbar spine and surrounding musculature. [facility] clinic handout   Person(s) Educated Patient   Methods Explanation;Verbal cues;Handout;Demonstration   Comprehension Verbalized understanding;Verbal cues required;Returned demonstration                    Plan - 01/23/16 1229    Clinical Impression Statement Mrs. Cheyenne presents to OPPT as moderate complexity evaluation with involved PMHx, evolving symptoms, and evaluation findings with CC of chronic low back pain. She exhibits functional trunk mobility with report of pain with flexion and limited side bending to the L. weakness in bil LE with L>R. tightness in the Quadratus lombourn and bil paraspinals. Educated on HEP and provided [employer].    Rehab Potential Good   PT Frequency One time visit   PT Next Visit Plan one time evaluation for Medicaid    PT Home Exercise Plan posture education, pelvic tilts, supine marching, standing hip abduction/ extension, QL stretching    Consulted and Agree with Plan of Care Patient      Patient will benefit from skilled therapeutic intervention in order to improve the  following deficits and impairments:  Pain, Improper body mechanics, Postural dysfunction, Decreased strength, Increased fascial restricitons, Decreased activity tolerance, Decreased endurance  Visit Diagnosis: Chronic left-sided low back pain, with sciatica presence unspecified - Plan: PT plan of care cert/re-cert  Other muscle spasm - Plan: PT plan of care cert/re-cert     Problem List Patient Active Problem List   Diagnosis Date Noted  . Skin mass 01/17/2016  . Decreased visual acuity 01/17/2016  . Depressed mood 07/27/2015  . Weakness   . Long-term (current) use of anticoagulants 12/14/2014  . S/P MVR (mitral valve replacement) 12/05/2014  . Moderate aortic regurgitation 10/26/2014  . Renal vascular disease 10/26/2014  . Acute on chronic diastolic congestive heart failure ([place]) 10/21/2014  . Tobacco use 10/21/2014  . Diastolic dysfunction, grade 2 by echo June 2016 10/21/2014  . Carpal tunnel syndrome 10/17/2014  . Left shoulder pain 09/17/2014  . Pulmonary nodule, right-needs repeat CT in Dec 2016 09/17/2014  . Abnormal CXR 08/25/2014  . Severe mitral regurgitation 08/25/2014  . Unstable angina ([place]) 08/22/2014  . Hematuria 07/01/2014  . Low back pain 07/05/2012  . Insomnia 07/05/2012  . PVC (premature ventricular contraction) 10/02/2011  . Fatigue 09/04/2011  . Chest pain 09/04/2011  . History of angioedema with ACE 2013 06/14/2011  . PVD- s/p multiple proceedures 06/04/2011  . Hyperlipidemia 09/28/2008  . Essential hypertension, benign 09/28/2008  . CAD S/P LAD DES 2009 with 70% ISR 08/24/14 09/28/2008   Starr Lake PT, DPT, LAT, ATC  01/23/16  12:59 PM      [facility] [address] [place], [place], [address] Phone: [phone]   Fax:  [phone]  Name:  ARIANAH TORGESON MRN: [id number] Date of Birth: March 01, 1951

## 2016-02-19 ENCOUNTER — Other Ambulatory Visit: Payer: Self-pay | Admitting: Family Medicine

## 2016-02-19 NOTE — Telephone Encounter (Signed)
Pt needs refills on tramadol and amlodipine. Pt uses [facility] on [address] Please advise. Thanks! ep

## 2016-02-20 ENCOUNTER — Other Ambulatory Visit: Payer: Self-pay | Admitting: Family Medicine

## 2016-02-20 MED ORDER — ALBUTEROL SULFATE HFA 108 (90 BASE) MCG/ACT IN AERS: 2.0000 | INHALATION_SPRAY | Freq: Four times a day (QID) | RESPIRATORY_TRACT | 0 refills | Status: AC | PRN

## 2016-02-20 NOTE — Telephone Encounter (Signed)
There should be a refill on the tramadol Rx that was written on 10/23 therefore this refill request was not authorized.   Albuterol was refilled.  Thanks, Archie Patten, MD [facility] Resident  02/20/2016, 8:05 AM

## 2016-03-20 ENCOUNTER — Other Ambulatory Visit: Payer: Self-pay | Admitting: Family Medicine

## 2016-03-20 NOTE — Telephone Encounter (Signed)
Pt is calling for a refill on her Tramadol. Please call when ready to pick up. jw

## 2016-03-21 MED ORDER — TRAMADOL HCL 50 MG PO TABS: 50.0000 mg | ORAL_TABLET | Freq: Four times a day (QID) | ORAL | 0 refills | Status: DC | PRN

## 2016-03-21 MED ORDER — METOPROLOL TARTRATE 25 MG PO TABS: 25.0000 mg | ORAL_TABLET | Freq: Two times a day (BID) | ORAL | 2 refills | Status: DC

## 2016-03-21 NOTE — Telephone Encounter (Signed)
Please call and let the patient know that her Rx for tramadol is at the front desk. She needs to f/u in our clinic prior to any additional refills.  Thanks, Archie Patten, MD [facility] Resident  03/21/2016, 12:45 PM

## 2016-03-22 NOTE — Telephone Encounter (Signed)
Spoke with patient and told her the Rx was ready for pick up at front desk. Patient stated she has an appointment for 04/02/16 @ 2pm. Nat Christen, CMA

## 2016-04-02 ENCOUNTER — Ambulatory Visit: Payer: Medicaid Other | Admitting: Family Medicine

## 2016-04-16 ENCOUNTER — Ambulatory Visit (INDEPENDENT_AMBULATORY_CARE_PROVIDER_SITE_OTHER): Payer: Medicaid Other | Admitting: Family Medicine

## 2016-04-16 ENCOUNTER — Encounter: Payer: Self-pay | Admitting: Family Medicine

## 2016-04-16 VITALS — BP 130/70 | HR 94 | Temp 98.0°F | Ht 74.0 in | Wt 187.0 lb

## 2016-04-16 DIAGNOSIS — Z1211 Encounter for screening for malignant neoplasm of colon: Secondary | ICD-10-CM | POA: Diagnosis not present

## 2016-04-16 DIAGNOSIS — R42 Dizziness and giddiness: Secondary | ICD-10-CM | POA: Diagnosis not present

## 2016-04-16 DIAGNOSIS — Z0000 Encounter for general adult medical examination without abnormal findings: Secondary | ICD-10-CM

## 2016-04-16 DIAGNOSIS — G8929 Other chronic pain: Secondary | ICD-10-CM | POA: Diagnosis not present

## 2016-04-16 DIAGNOSIS — Z1239 Encounter for other screening for malignant neoplasm of breast: Secondary | ICD-10-CM

## 2016-04-16 DIAGNOSIS — Z1231 Encounter for screening mammogram for malignant neoplasm of breast: Secondary | ICD-10-CM

## 2016-04-16 DIAGNOSIS — R911 Solitary pulmonary nodule: Secondary | ICD-10-CM

## 2016-04-16 DIAGNOSIS — M545 Low back pain, unspecified: Secondary | ICD-10-CM

## 2016-04-16 DIAGNOSIS — H547 Unspecified visual loss: Secondary | ICD-10-CM

## 2016-04-16 MED ORDER — TRAMADOL HCL 50 MG PO TABS: 50.0000 mg | ORAL_TABLET | Freq: Four times a day (QID) | ORAL | 2 refills | Status: DC | PRN

## 2016-04-16 MED ORDER — CYCLOBENZAPRINE HCL 10 MG PO TABS: 10.0000 mg | ORAL_TABLET | Freq: Three times a day (TID) | ORAL | 2 refills | Status: DC | PRN

## 2016-04-16 NOTE — Progress Notes (Signed)
Patient ID: Leah [name], female   DOB: 01/21/1951, 66 y.o.   MRN: [id number]    Subjective: CC:  Pain f/u HPI: Patient is a 66 y.o. female with a past medical history of rheumatic disease s/p MVR with pericardial tissue valve and CABG x 1 with left IMA to the LAD on 9/12, HTN, CAD presenting to clinic today for a pain follow up and concerns for dizziness.   Dizziness: noted over the last month or so. She feels she's spinning. Noticeable when getting up from a seated position. This is not consistent each time she stands up;  last occurred on Thursday. No issues changing positions in bed. No N/V, chest pain, SOB, or palpitations. No weakness or tinnitus. No falls, notes she's only dizzy for a few seconds and then it resolves spontaneously. She hasn't had issues with this in the past.  Lumbar pain Pain is still dull and pulling in the lumbar region. Worse with prolonged sitting or standing. Went to PT 1 day (this was all that Medicaid would only pay for). She's been doing those exercises at home.  Pain pretty well controlled currently with tramadol; sometimes she must take 2-3 per day but notes she is not taking it on a daily basis.  Patient denies radicular pain. No urinary or bowel incontinence. No saddle paresthesias.  Decreased vision: got her glasses prior to Christmas. They stated she has mild signs of glaucoma which should be f/u annually.   Mole: present on R ear. Going to derm on 05/03/16.  Social History: back smoking, up to 0.5-1ppd.   Health Maintenance: patient aware she's due for multiple things such as colonoscopy and mammogram. Received flu vaccine, hep C and HIV screening today.   ROS: All other systems reviewed and are negative besides that noted in HPI.   Past Medical History Patient Active Problem List   Diagnosis Date Noted  . Health care maintenance 04/17/2016  . Dizziness 04/17/2016  . Skin mass 01/17/2016  . Decreased visual acuity 01/17/2016  . Depressed  mood 07/27/2015  . Weakness   . S/P MVR (mitral valve replacement) 12/05/2014  . Moderate aortic regurgitation 10/26/2014  . Renal vascular disease 10/26/2014  . Acute on chronic diastolic congestive heart failure ([place]) 10/21/2014  . Tobacco use 10/21/2014  . Diastolic dysfunction, grade 2 by echo June 2016 10/21/2014  . Carpal tunnel syndrome 10/17/2014  . Left shoulder pain 09/17/2014  . Pulmonary nodule, right-needs repeat CT in Dec 2016 09/17/2014  . Abnormal CXR 08/25/2014  . Severe mitral regurgitation 08/25/2014  . Unstable angina ([place]) 08/22/2014  . Hematuria 07/01/2014  . Low back pain 07/05/2012  . Insomnia 07/05/2012  . PVC (premature ventricular contraction) 10/02/2011  . History of angioedema with ACE 2013 06/14/2011  . PVD- s/p multiple proceedures 06/04/2011  . Hyperlipidemia 09/28/2008  . Essential hypertension, benign 09/28/2008  . CAD S/P LAD DES 2009 with 70% ISR 08/24/14 09/28/2008     Medications- reviewed and updated  Objective: Office vital signs reviewed. BP 130/70 (BP Location: Left Arm, Patient Position: Sitting, Cuff Size: Normal)   Pulse 94   Temp 98 F (36.7 C) (Oral)   Ht '6\' 2"'$  (1.88 m)   Wt 187 lb (84.8 kg)   SpO2 99%   BMI 24.01 kg/m    Orthostatic vitals: Lying 130/90 Sitting 140/88 Standing 0 mins: 126/86 Standing 3 mins: 130/100  Physical Examination:  General: Awake, alert, well- nourished, NAD Cardio: Regular rate and rhythm. II/VI systolic murmur. No thrill noted.  Pulm: No increased WOB.  CTAB, without wheezes, rhonchi or crackles noted. 2+ DP pulses MSK: normal to inspection,  no tenderness to palpation. Mild pain with flexion, improved with extension.  Negative SLR. 5/5 strength in the UE and LE bilaterally. Sensation intact in the LEs bilaterally. Normal DTRs.  Normal gait and station.   Assessment/Plan: Health care maintenance Referral placed for colonoscopy. Order placed for mammogram however pt advised she could call to  schedule this appt on her own. Advised pt to f/u for a pap smear Pt received an Rx for Zostavax but notes that insurance would not cover it and she cannot afford it.  Decreased visual acuity Improved with glasses.  Low back pain No red flags on exam or history. Pain seems to be stable. Discussed continued exercises that she learned in PT as I suspect much of her pain is secondary to poor posture in the setting of her tall stature. - UDS obtained today. - refilled tramadol for 3 month supply. - continue Tylenol PRN mild pain - continue home exercises.  - ultimately would like to get patient off tramadol with back strengthening exercises.  Pulmonary nodule, right-needs repeat CT in Dec 2016 CT from 08/2015 revealed Continued reduction in the size of the apical right upper lobe pulmonary nodule, now 3 mm, consistent with resolving inflammatory nodule. Was seen by CVTS after this who noted with the patient's significant smoking history and family history of lung cancer they would refer her to the lung cancer screening program for further CT scans of the chest next year (08/2016).   Dizziness Patient's history consistent with orthostatic hypotension. She notes mild dizziness with orthostatic vital signs, however vitals are not diagnostic. Otherwise well appearing without other symptoms such as SOB, chest pain, palpitations, diaphoresis. - advised pt to stay well hydrated - will continue current hypertensives for now. - if these spells become more frequent, prominent, or have other associated symptoms in the future advised to follow up with [place] as I'd like to do further cardiac work up given her history.    Archie Patten PGY-3, [place] [facility]

## 2016-04-16 NOTE — Patient Instructions (Addendum)
I'm glad that you were able to get glasses.  While your vitals do not currently look like orthostatic hypotension, the story your provided about dizziness on standing is very consistent. As your blood pressure is normal currently and your symptoms are not daly, we will not decrease your medications as this time.  Try to stay well hydrated, this will help prevent the dizziness from occurring.  Please come in if your symptoms become more consistent.   Orthostatic Hypotension Orthostatic hypotension is a sudden drop in blood pressure that happens when you quickly change positions, such as when you get up from a seated or lying position. Blood pressure is a measurement of how strongly, or weakly, your blood is pressing against the walls of your arteries. Arteries are blood vessels that carry blood from your heart throughout your body. When blood pressure is too low, you may not get enough blood to your brain or to the rest of your organs. This can cause weakness, light-headedness, rapid heartbeat, and fainting. This can last for just a few seconds or for up to a few minutes. Orthostatic hypotension is usually not a serious problem. However, if it happens frequently or gets worse, it may be a sign of something more serious. What are the causes? This condition may be caused by:  Sudden changes in posture, such as standing up quickly after you have been sitting or lying down.  Blood loss.  Loss of body fluids (dehydration).  Heart problems.  Hormone (endocrine) problems.  Pregnancy.  Severe infection.  Lack of certain nutrients.  Severe allergic reactions (anaphylaxis).  Certain medicines, such as blood pressure medicine or medicines that make the body lose excess fluids (diuretics). Sometimes, this condition can be caused by not taking medicine as directed, such as taking too much of a certain medicine. What increases the risk? Certain factors can make you more likely to develop  orthostatic hypotension, including:  Age. Risk increases as you get older.  Conditions that affect the heart or the central nervous system.  Taking certain medicines, such as blood pressure medicine or diuretics.  Being pregnant. What are the signs or symptoms? Symptoms of this condition may include:  Weakness.  Light-headedness.  Dizziness.  Blurred vision.  Fatigue.  Rapid heartbeat.  Fainting, in severe cases. How is this diagnosed? This condition is diagnosed based on:  Your medical history.  Your symptoms.  Your blood pressure measurement. Your health care provider will check your blood pressure when you are:  Lying down.  Sitting.  Standing. A blood pressure reading is recorded as two numbers, such as "120 over 80" (or 120/80). The first ("top") number is called the systolic pressure. It is a measure of the pressure in your arteries as your heart beats. The second ("bottom") number is called the diastolic pressure. It is a measure of the pressure in your arteries when your heart relaxes between beats. Blood pressure is measured in a unit called mm Hg. Healthy blood pressure for adults is 120/80. If your blood pressure is below 90/60, you may be diagnosed with hypotension. Other information or tests that may be used to diagnose orthostatic hypotension include:  Your other vital signs, such as your heart rate and temperature.  Blood tests.  Tilt table test. For this test, you will be safely secured to a table that moves you from a lying position to an upright position. Your heart rhythm and blood pressure will be monitored during the test. How is this treated? Treatment for this  condition may include:  Changing your diet. This may involve eating more salt (sodium) or drinking more water.  Taking medicines to raise your blood pressure.  Changing the dosage of certain medicines you are taking that might be lowering your blood pressure.  Wearing compression  stockings. These stockings help to prevent blood clots and reduce swelling in your legs. In some cases, you may need to go to the hospital for:  Fluid replacement. This means you will receive fluids through an IV tube.  Blood replacement. This means you will receive donated blood through an IV tube (transfusion).  Treating an infection or heart problems, if this applies.  Monitoring. You may need to be monitored while medicines that you are taking wear off. Follow these instructions at home: Eating and drinking  Drink enough fluid to keep your urine clear or pale yellow.  Eat a healthy diet and follow instructions from your health care provider about eating or drinking restrictions. A healthy diet includes:  Fresh fruits and vegetables.  Whole grains.  Lean meats.  Low-fat dairy products.  Eat extra salt only as directed. Do not add extra salt to your diet unless your health care provider told you to do that.  Eat frequent, small meals.  Avoid standing up suddenly after eating. Medicines  Take over-the-counter and prescription medicines only as told by your health care provider.  Follow instructions from your health care provider about changing the dosage of your current medicines, if this applies.  Do not stop or adjust any of your medicines on your own. General instructions  Wear compression stockings as told by your health care provider.  Get up slowly from lying down or sitting positions. This gives your blood pressure a chance to adjust.  Avoid hot showers and excessive heat as directed by your health care provider.  Return to your normal activities as told by your health care provider. Ask your health care provider what activities are safe for you.  Do not use any products that contain nicotine or tobacco, such as cigarettes and e-cigarettes. If you need help quitting, ask your health care provider.  Keep all follow-up visits as told by your health care  provider. This is important. Contact a health care provider if:  You vomit.  You have diarrhea.  You have a fever for more than 2-3 days.  You feel more thirsty than usual.  You feel weak and tired. Get help right away if:  You have chest pain.  You have a fast or irregular heartbeat.  You develop numbness in any part of your body.  You cannot move your arms or your legs.  You have trouble speaking.  You become sweaty or feel lightheaded.  You faint.  You feel short of breath.  You have trouble staying awake.  You feel confused. This information is not intended to replace advice given to you by your health care provider. Make sure you discuss any questions you have with your health care provider. Document Released: 03/01/2002 Document Revised: 11/28/2015 Document Reviewed: 09/01/2015 Elsevier Interactive Patient Education  2017 [employer].

## 2016-04-17 ENCOUNTER — Encounter: Payer: Self-pay | Admitting: Gastroenterology

## 2016-04-17 DIAGNOSIS — Z0000 Encounter for general adult medical examination without abnormal findings: Secondary | ICD-10-CM | POA: Insufficient documentation

## 2016-04-17 DIAGNOSIS — R42 Dizziness and giddiness: Secondary | ICD-10-CM | POA: Insufficient documentation

## 2016-04-17 NOTE — Assessment & Plan Note (Signed)
CT from 08/2015 revealed Continued reduction in the size of the apical right upper lobe pulmonary nodule, now 3 mm, consistent with resolving inflammatory nodule. Was seen by CVTS after this who noted with the patient's significant smoking history and family history of lung cancer they would refer her to the lung cancer screening program for further CT scans of the chest next year (08/2016).

## 2016-04-17 NOTE — Assessment & Plan Note (Signed)
Referral placed for colonoscopy. Order placed for mammogram however pt advised she could call to schedule this appt on her own. Advised pt to f/u for a pap smear Pt received an Rx for Zostavax but notes that insurance would not cover it and she cannot afford it.

## 2016-04-17 NOTE — Assessment & Plan Note (Signed)
Improved with glasses.

## 2016-04-17 NOTE — Assessment & Plan Note (Addendum)
Patient's history consistent with orthostatic hypotension. She notes mild dizziness with orthostatic vital signs, however vitals are not diagnostic. Otherwise well appearing without other symptoms such as SOB, chest pain, palpitations, diaphoresis. - advised pt to stay well hydrated - will continue current hypertensives for now. - if these spells become more frequent, prominent, or have other associated symptoms in the future advised to follow up with [place] as I'd like to do further cardiac work up given her history.

## 2016-04-17 NOTE — Assessment & Plan Note (Signed)
No red flags on exam or history. Pain seems to be stable. Discussed continued exercises that she learned in PT as I suspect much of her pain is secondary to poor posture in the setting of her tall stature. - UDS obtained today. - refilled tramadol for 3 month supply. - continue Tylenol PRN mild pain - continue home exercises.  - ultimately would like to get patient off tramadol with back strengthening exercises.

## 2016-04-18 ENCOUNTER — Other Ambulatory Visit: Payer: Self-pay | Admitting: Family Medicine

## 2016-04-18 NOTE — Telephone Encounter (Signed)
Pt needs a refill on trazodone. Pt uses [facility] on [address]. ep

## 2016-04-21 MED ORDER — TRAZODONE HCL 100 MG PO TABS: 100.0000 mg | ORAL_TABLET | Freq: Every evening | ORAL | 2 refills | Status: DC | PRN

## 2016-05-23 ENCOUNTER — Ambulatory Visit (INDEPENDENT_AMBULATORY_CARE_PROVIDER_SITE_OTHER): Payer: Medicaid Other | Admitting: Gastroenterology

## 2016-05-23 ENCOUNTER — Encounter: Payer: Self-pay | Admitting: Gastroenterology

## 2016-05-23 VITALS — BP 124/80 | HR 92 | Ht 73.5 in | Wt 189.0 lb

## 2016-05-23 DIAGNOSIS — K5900 Constipation, unspecified: Secondary | ICD-10-CM

## 2016-05-23 DIAGNOSIS — Z1211 Encounter for screening for malignant neoplasm of colon: Secondary | ICD-10-CM | POA: Diagnosis not present

## 2016-05-23 MED ORDER — NA SULFATE-K SULFATE-MG SULF 17.5-3.13-1.6 GM/177ML PO SOLN: 1.0000 | Freq: Once | ORAL | 0 refills | Status: AC

## 2016-05-23 NOTE — Patient Instructions (Signed)
If you are age 64 or older, your body mass index should be between 23-30. Your Body mass index is 24.6 kg/m. If this is out of the aforementioned range listed, please consider follow up with your Primary Care Provider.  If you are age 18 or younger, your body mass index should be between 19-25. Your Body mass index is 24.6 kg/m. If this is out of the aformentioned range listed, please consider follow up with your Primary Care Provider.   We have sent the following medications to your pharmacy for you to pick up at your convenience:  [place] have been scheduled for a colonoscopy. Please follow written instructions given to you at your visit today.  Please pick up your prep supplies at the pharmacy within the next 1-3 days. If you use inhalers (even only as needed), please bring them with you on the day of your procedure. Your physician has requested that you go to www.startemmi.com and enter the access code given to you at your visit today. This web site gives a general overview about your procedure. However, you should still follow specific instructions given to you by our office regarding your preparation for the procedure.  You have been given a handout on Fiber Supplements.  Thank you.

## 2016-05-23 NOTE — Progress Notes (Signed)
HPI :  66 y/o female with history of severe MR with mitral valve replacement and CABG in Sept 2016, COPD, tobacco use, here for visit to discuss colon cancer screening.   She has had a prior colonoscopy, she thinks > 10 years ago, done at [facility]. She thinks she had some benign polyps removed, she did not know when she was told to follow up for another one. No FH of colon cancer. No trouble with her bowels. No blood in the stools. She has some occasional constipation, which can cause problems with hemorrhoid. She has some itching and pritus with mild bleeding due to hemorrhoids. She does not take anything for constipation. She has a BM once every other day. No abdominal pains.   She is doing well from cardiovascular standpoint, no exertional, no shortness of breath or chest pains. No blood thinners.   Last echo 03/10/2015 - EF 45-50%  Past Medical History:  Diagnosis Date  . Anemia   . CAD (coronary artery disease)   . Carotid artery occlusion   . COPD (chronic obstructive pulmonary disease) ([place])   . GERD (gastroesophageal reflux disease)   . Headache(784.0)   . Hyperlipidemia   . Hypertension   . Leg pain   . Peripheral vascular disease ([place])   . Renal vascular disease 10/26/2014   bilateral stents placed   . Severe mitral regurgitation   . Shortness of breath dyspnea   . Tobacco abuse      Past Surgical History:  Procedure Laterality Date  . ABDOMINAL HYSTERECTOMY    . ANGIOPLASTY / STENTING ILIAC  2010   right external iliac by Dr. Irish Lack  . CARDIAC CATHETERIZATION  11/13/11   Left Heart Cath. with Coronary Angiogram  . CARDIAC CATHETERIZATION N/A 08/24/2014   Procedure: Left Heart Cath and Coronary Angiography;  Surgeon: Wellington Hampshire, MD;  Location: [place] [facility];  Service: Cardiovascular;  Laterality: N/A;  . CARDIAC CATHETERIZATION N/A 10/26/2014   Procedure: Right/Left Heart Cath and Coronary Angiography;  Surgeon: Peter M Martinique, MD; oLAD 70%, mLAD  70% ISR, D2 30%, OFC 30%, RCA 20%, EF nl, low R heart pressures after diuresis, severe MR  . CAROTID ENDARTERECTOMY  11/21/2007   left  . CORONARY ANGIOPLASTY WITH STENT PLACEMENT  6/09   LAD 2.5x12 Prom[place]  . CORONARY ARTERY BYPASS GRAFT N/A 12/05/2014   Procedure: CORONARY ARTERY BYPASS GRAFTING (CABG);  Surgeon: [name], MD;  Location: [place];  Service: Open Heart Surgery;  Laterality: N/A;  Times 1 using left internal mammary artery to LAD  . FEMORAL-POPLITEAL BYPASS GRAFT  10/12   left Dr. Kellie Simmering  . MITRAL VALVE REPLACEMENT N/A 12/05/2014   Procedure: MITRAL VALVE (MV) REPLACEMENT;  Surgeon: [name], MD;  Location: [place];  Service: Open Heart Surgery;  Laterality: N/A;  Closure left atrial appendage  . RENAL ARTERY STENT Bilateral   . TEE WITHOUT CARDIOVERSION N/A 10/24/2014   Procedure: TRANSESOPHAGEAL ECHOCARDIOGRAM (TEE);  Surgeon: Thayer Headings, MD;  Location: [place];  Service: Cardiovascular;  Laterality: N/A;  . TEE WITHOUT CARDIOVERSION N/A 12/05/2014   Procedure: TRANSESOPHAGEAL ECHOCARDIOGRAM (TEE);  Surgeon: [name], MD;  Location: [place];  Service: Open Heart Surgery;  Laterality: N/A;   Family History  Problem Relation Age of Onset  . Cancer Mother     BRAIN AND LUNG  . Hypertension Mother   . Hypertension Father   . Heart disease Father   . Prostate cancer Father   .  Kidney disease Father   . Hypertension Brother   . ALS Brother   . Stroke Paternal Aunt     great aunt  . Heart attack Neg Hx   . Colon cancer Neg Hx   . Stomach cancer Neg Hx   . Rectal cancer Neg Hx   . Esophageal cancer Neg Hx   . Liver cancer Neg Hx    Social History  Substance Use Topics  . Smoking status: Current Every Day Smoker    Packs/day: 0.50    Years: 35.00    Types: Cigarettes    Last attempt to quit: 10/31/2014  . Smokeless tobacco: Never Used     Comment: has not started wellbutrin yet  . Alcohol use No   Current Outpatient Prescriptions    Medication Sig Dispense Refill  . albuterol (PROVENTIL HFA;VENTOLIN HFA) 108 (90 Base) MCG/ACT inhaler Inhale 2 puffs into the lungs every 6 (six) hours as needed for wheezing or shortness of breath. 1 Inhaler 0  . amLODipine (NORVASC) 5 MG tablet TAKE TWO TABLETS BY MOUTH ONCE DAILY 60 tablet 5  . aspirin EC 81 MG tablet Take 1 tablet (81 mg total) by mouth daily.    Marland Kitchen atorvastatin (LIPITOR) 80 MG tablet Take 1 tablet (80 mg total) by mouth daily. 30 tablet 2  . cyclobenzaprine (FLEXERIL) 10 MG tablet Take 1 tablet (10 mg total) by mouth 3 (three) times daily as needed. for muscle spams 70 tablet 2  . metoprolol tartrate (LOPRESSOR) 25 MG tablet Take 1 tablet (25 mg total) by mouth 2 (two) times daily. 30 tablet 2  . nicotine (NICODERM CQ - DOSED IN MG/24 HOURS) 14 mg/24hr patch Place 1 patch (14 mg total) onto the skin daily. 28 patch 2  . nitroGLYCERIN (NITROSTAT) 0.4 MG SL tablet Place 1 tablet (0.4 mg total) under the tongue every 5 (five) minutes x 3 doses as needed for chest pain. 25 tablet 12  . traMADol (ULTRAM) 50 MG tablet Take 1-2 tablets (50-100 mg total) by mouth every 6 (six) hours as needed. for pain 30 tablet 2  . traZODone (DESYREL) 100 MG tablet Take 1 tablet (100 mg total) by mouth at bedtime as needed for sleep. 30 tablet 2   No current facility-administered medications for this visit.    Allergies  Allergen Reactions  . Lisinopril Swelling    Angioedema 06/10/11  . Aspirin Other (See Comments)    Upset stomach  . Chantix [Varenicline]     insomnia  . Penicillins Hives     Review of Systems: All systems reviewed and negative except where noted in HPI.   Lab Results  Component Value Date   WBC 11.1 (H) 01/15/2016   HGB 14.0 01/15/2016   HCT 41.7 01/15/2016   MCV 77.7 (L) 01/15/2016   PLT 170 01/15/2016    Lab Results  Component Value Date   CREATININE 0.99 01/15/2016   BUN 12 01/15/2016   NA 141 01/15/2016   K 3.7 01/15/2016   CL 107 01/15/2016   CO2  26 01/15/2016     Physical Exam: BP 124/80   Pulse 92   Ht 6' 1.5" (1.867 m)   Wt 189 lb (85.7 kg)   BMI 24.60 kg/m  Constitutional: Pleasant,well-developed, female in no acute distress. HEENT: Normocephalic and atraumatic. Conjunctivae are normal. No scleral icterus. Neck supple.  Cardiovascular: Normal rate, regular rhythm.  Pulmonary/chest: Effort normal and breath sounds normal.  Abdominal: Soft, nondistended, nontender.. There are no masses palpable. No  hepatomegaly. Extremities: no edema Lymphadenopathy: No cervical adenopathy noted. Neurological: Alert and oriented to person place and time. Skin: Skin is warm and dry. No rashes noted. Psychiatric: Normal mood and affect. Behavior is normal.   ASSESSMENT AND PLAN: 66 y/o female with significant cardiac history as outlined above, doing well post-operatively without cardiopulmonary symptoms. Here for colon cancer screening and constipation today as outlined above.   I discussed colon cancer screening options with her to include colonoscopy and stool based testing. Following discussion of risks and benefits, she wanted to proceed with colonoscopy. Otherwise regarding her constipation recommend a daily fiber supplement initially to see if this helps. Further recommendations pending the results of this exam. All questions answered.  [place] Cellar, MD [facility] Pager [phone]  CC: Archie Patten, MD

## 2016-06-05 ENCOUNTER — Ambulatory Visit (AMBULATORY_SURGERY_CENTER): Payer: Medicaid Other | Admitting: Gastroenterology

## 2016-06-05 ENCOUNTER — Encounter: Payer: Self-pay | Admitting: Gastroenterology

## 2016-06-05 VITALS — BP 137/75 | HR 59 | Temp 95.5°F | Resp 13 | Ht 73.0 in | Wt 189.0 lb

## 2016-06-05 DIAGNOSIS — Z1211 Encounter for screening for malignant neoplasm of colon: Secondary | ICD-10-CM

## 2016-06-05 DIAGNOSIS — D123 Benign neoplasm of transverse colon: Secondary | ICD-10-CM

## 2016-06-05 DIAGNOSIS — K635 Polyp of colon: Secondary | ICD-10-CM | POA: Diagnosis not present

## 2016-06-05 DIAGNOSIS — Z1212 Encounter for screening for malignant neoplasm of rectum: Secondary | ICD-10-CM | POA: Diagnosis not present

## 2016-06-05 DIAGNOSIS — D124 Benign neoplasm of descending colon: Secondary | ICD-10-CM | POA: Diagnosis not present

## 2016-06-05 DIAGNOSIS — D127 Benign neoplasm of rectosigmoid junction: Secondary | ICD-10-CM

## 2016-06-05 MED ORDER — SODIUM CHLORIDE 0.9 % IV SOLN: 500.0000 mL | INTRAVENOUS | Status: DC

## 2016-06-05 NOTE — Progress Notes (Signed)
Called to room to assist during endoscopic procedure.  Patient ID and intended procedure confirmed with present staff. Received instructions for my participation in the procedure from the performing physician.  

## 2016-06-05 NOTE — Op Note (Signed)
[place] Patient Name: Leah [name] Procedure Date: 06/05/2016 10:54 AM MRN: [id number] Endoscopist: Remo Lipps P. Taylor Levick MD, MD Age: 66 Referring MD:  Date of Birth: November 14, 1950 Gender: Female Account #: [id number] Procedure:                Colonoscopy Indications:              Screening for colorectal malignant neoplasm Medicines:                Monitored Anesthesia Care Procedure:                Pre-Anesthesia Assessment:                           - Prior to the procedure, a History and Physical                            was performed, and patient medications and                            allergies were reviewed. The patient's tolerance of                            previous anesthesia was also reviewed. The risks                            and benefits of the procedure and the sedation                            options and risks were discussed with the patient.                            All questions were answered, and informed consent                            was obtained. Prior Anticoagulants: The patient has                            taken aspirin, last dose was 1 day prior to                            procedure. ASA Grade Assessment: III - A patient                            with severe systemic disease. After reviewing the                            risks and benefits, the patient was deemed in                            satisfactory condition to undergo the procedure.                           After obtaining informed consent, the colonoscope  was passed under direct vision. Throughout the                            procedure, the patient's blood pressure, pulse, and                            oxygen saturations were monitored continuously. The                            Colonoscope was introduced through the anus and                            advanced to the the terminal ileum, with   identification of the appendiceal orifice and IC                            valve. The colonoscopy was performed without                            difficulty. The patient tolerated the procedure                            well. The quality of the bowel preparation was                            good. The ileocecal valve, appendiceal orifice, and                            rectum were photographed. Scope In: 10:57:09 AM Scope Out: 11:17:40 AM Scope Withdrawal Time: 0 hours 16 minutes 32 seconds  Total Procedure Duration: 0 hours 20 minutes 31 seconds  Findings:                 The perianal and digital rectal examinations were                            normal.                           The terminal ileum appeared normal.                           A 4 mm polyp was found in the transverse colon. The                            polyp was sessile. The polyp was removed with a                            cold snare. Resection and retrieval were complete.                           Two sessile polyps were found in the descending                            colon. The polyps were 5  to 6 mm in size. These                            polyps were removed with a cold snare. Resection                            and retrieval were complete.                           There were multiple flat polyps in the rectosigmoid                            and distal sigmoid colon grossly consistent with                            benign hyperplastic polyps. The polyps were 4 to 5                            mm in size. Two representative polyps were removed                            with a cold snare. Resection and retrieval were                            complete.                           Scattered medium-mouthed diverticula were found in                            the transverse colon and left colon.                           The exam was otherwise without abnormality on                            direct and  retroflexion views. Complications:            No immediate complications. Estimated blood loss:                            Minimal. Estimated Blood Loss:     Estimated blood loss was minimal. Impression:               - The examined portion of the ileum was normal.                           - One 4 mm polyp in the transverse colon, removed                            with a cold snare. Resected and retrieved.                           - Two 5 to 6 mm polyps in the descending colon,  removed with a cold snare. Resected and retrieved.                           - Benign hyperplastic polyps of the distal left                            colon Two representative polyps removed with a cold                            snare. Resected and retrieved.                           - Diverticulosis in the transverse colon and in the                            left colon.                           - The examination was otherwise normal on direct                            and retroflexion views. Recommendation:           - Patient has a contact number available for                            emergencies. The signs and symptoms of potential                            delayed complications were discussed with the                            patient. Return to normal activities tomorrow.                            Written discharge instructions were provided to the                            patient.                           - Resume previous diet.                           - Continue present medications.                           - No ibuprofen, naproxen, or other non-steroidal                            anti-inflammatory drugs for 2 weeks after polyp                            removal.                           - Await  pathology results.                           - Repeat colonoscopy is recommended for                            surveillance. The colonoscopy date will be                             determined after pathology results from today's                            exam become available for review. [name] MD, MD 06/05/2016 11:24:58 AM This report has been signed electronically.

## 2016-06-05 NOTE — Patient Instructions (Signed)
Hand out given on Polyps   YOU HAD AN ENDOSCOPIC PROCEDURE TODAY: Refer to the procedure report and other information in the discharge instructions given to you for any specific questions about what was found during the examination. If this information does not answer your questions, please call [place] office at [phone] to clarify.   YOU SHOULD EXPECT: Some feelings of bloating in the abdomen. Passage of more gas than usual. Walking can help get rid of the air that was put into your GI tract during the procedure and reduce the bloating. If you had a lower endoscopy (such as a colonoscopy or flexible sigmoidoscopy) you may notice spotting of blood in your stool or on the toilet paper. Some abdominal soreness may be present for a day or two, also.  DIET: Your first meal following the procedure should be a light meal and then it is ok to progress to your normal diet. A half-sandwich or bowl of soup is an example of a good first meal. Heavy or fried foods are harder to digest and may make you feel nauseous or bloated. Drink plenty of fluids but you should avoid alcoholic beverages for 24 hours. If you had a esophageal dilation, please see attached instructions for diet.    ACTIVITY: Your care partner should take you home directly after the procedure. You should plan to take it easy, moving slowly for the rest of the day. You can resume normal activity the day after the procedure however YOU SHOULD NOT DRIVE, use power tools, machinery or perform tasks that involve climbing or major physical exertion for 24 hours (because of the sedation medicines used during the test).   SYMPTOMS TO REPORT IMMEDIATELY: A gastroenterologist can be reached at any hour. Please call [phone]  for any of the following symptoms:  Following lower endoscopy (colonoscopy, flexible sigmoidoscopy) Excessive amounts of blood in the stool  Significant tenderness, worsening of abdominal pains  Swelling of the abdomen that is  new, acute  Fever of 100 or higher    FOLLOW UP:  If any biopsies were taken you will be contacted by phone or by letter within the next 1-3 weeks. Call [phone]  if you have not heard about the biopsies in 3 weeks.  Please also call with any specific questions about appointments or follow up tests.

## 2016-06-05 NOTE — Progress Notes (Signed)
To PACU, vss patent aw report to rn 

## 2016-06-06 ENCOUNTER — Telehealth: Payer: Self-pay | Admitting: *Deleted

## 2016-06-06 NOTE — Telephone Encounter (Signed)
  Follow up Call-  Call back number 06/05/2016  Post procedure Call Back phone  # [phone]  Permission to leave phone message Yes  Some recent data might be hidden     Patient questions:  Do you have a fever, pain , or abdominal swelling? No. Pain Score  0 *  Have you tolerated food without any problems? Yes.    Have you been able to return to your normal activities? Yes.    Do you have any questions about your discharge instructions: Diet   No. Medications  No. Follow up visit  No.  Do you have questions or concerns about your Care? No.  Actions: * If pain score is 4 or above: No action needed, pain <4.

## 2016-06-10 ENCOUNTER — Encounter: Payer: Self-pay | Admitting: Gastroenterology

## 2016-06-13 ENCOUNTER — Other Ambulatory Visit: Payer: Self-pay | Admitting: Family Medicine

## 2016-06-13 NOTE — Telephone Encounter (Signed)
Patient calling to request refill of:  Name of Medication(s): Trazadone Last date of OV:  04/16/16 Pharmacy:  [facility] / [place]  Will route refill request to Clinic RN.  Discussed with patient policy to call pharmacy for future refills.  Also, discussed refills may take up to 48 hours to approve or deny. Please call patient once sent. [place]

## 2016-06-14 MED ORDER — TRAZODONE HCL 100 MG PO TABS: 100.0000 mg | ORAL_TABLET | Freq: Every evening | ORAL | 2 refills | Status: DC | PRN

## 2016-06-25 ENCOUNTER — Ambulatory Visit: Payer: Medicaid Other

## 2016-07-15 ENCOUNTER — Other Ambulatory Visit: Payer: Self-pay | Admitting: Family Medicine

## 2016-07-15 DIAGNOSIS — G8929 Other chronic pain: Secondary | ICD-10-CM

## 2016-07-15 DIAGNOSIS — M545 Low back pain, unspecified: Secondary | ICD-10-CM

## 2016-07-15 MED ORDER — METOPROLOL TARTRATE 25 MG PO TABS: 25.0000 mg | ORAL_TABLET | Freq: Two times a day (BID) | ORAL | 2 refills | Status: DC

## 2016-07-15 MED ORDER — CYCLOBENZAPRINE HCL 10 MG PO TABS: 10.0000 mg | ORAL_TABLET | Freq: Three times a day (TID) | ORAL | 2 refills | Status: DC | PRN

## 2016-07-15 MED ORDER — TRAMADOL HCL 50 MG PO TABS: 50.0000 mg | ORAL_TABLET | Freq: Four times a day (QID) | ORAL | 0 refills | Status: DC | PRN

## 2016-07-15 NOTE — Telephone Encounter (Signed)
Pt calling to request refill of:  Name of Medication(s):tramadol, flexeril,metoprolol Pharmacy: [facility]  Will route refill request to Clinic RN.  Discussed with patient policy to call pharmacy for future refills.  Also, discussed refills may take up to 48 hours to approve or deny.  Roseanna Rainbow

## 2016-07-15 NOTE — Telephone Encounter (Signed)
1 month supply of tramadol called in, pt needs to f/u within the next month.  Thanks, Archie Patten, MD [facility] Family Medicine Resident  07/15/2016, 12:18 PM

## 2016-07-15 NOTE — Addendum Note (Signed)
Addended by: Archie Patten on: 07/15/2016 07:34 PM   Modules accepted: Orders

## 2016-07-15 NOTE — Telephone Encounter (Signed)
Pharmacy faxed a request for change in how many metoprolol are being dispensed from #30-#60 since patient is taking it BID.  Will forward to MD. Johnney Ou

## 2016-07-24 ENCOUNTER — Encounter: Payer: Self-pay | Admitting: Family Medicine

## 2016-07-24 ENCOUNTER — Ambulatory Visit (INDEPENDENT_AMBULATORY_CARE_PROVIDER_SITE_OTHER): Payer: Medicaid Other | Admitting: Family Medicine

## 2016-07-24 VITALS — BP 138/86 | HR 88 | Temp 98.5°F | Ht 73.5 in | Wt 184.4 lb

## 2016-07-24 DIAGNOSIS — Z122 Encounter for screening for malignant neoplasm of respiratory organs: Secondary | ICD-10-CM

## 2016-07-24 DIAGNOSIS — R911 Solitary pulmonary nodule: Secondary | ICD-10-CM | POA: Diagnosis not present

## 2016-07-24 DIAGNOSIS — M545 Low back pain: Secondary | ICD-10-CM | POA: Diagnosis not present

## 2016-07-24 DIAGNOSIS — G8929 Other chronic pain: Secondary | ICD-10-CM | POA: Diagnosis not present

## 2016-07-24 MED ORDER — TRAMADOL HCL 50 MG PO TABS: 50.0000 mg | ORAL_TABLET | Freq: Four times a day (QID) | ORAL | 0 refills | Status: DC | PRN

## 2016-07-24 MED ORDER — ASPIRIN EC 81 MG PO TBEC: 81.0000 mg | DELAYED_RELEASE_TABLET | Freq: Every day | ORAL | 1 refills | Status: DC

## 2016-07-24 MED ORDER — ATORVASTATIN CALCIUM 80 MG PO TABS: 80.0000 mg | ORAL_TABLET | Freq: Every day | ORAL | 2 refills | Status: DC

## 2016-07-24 MED ORDER — METOPROLOL TARTRATE 25 MG PO TABS: 25.0000 mg | ORAL_TABLET | Freq: Two times a day (BID) | ORAL | 2 refills | Status: DC

## 2016-07-24 MED ORDER — CYCLOBENZAPRINE HCL 10 MG PO TABS: 10.0000 mg | ORAL_TABLET | Freq: Three times a day (TID) | ORAL | 2 refills | Status: DC | PRN

## 2016-07-24 MED ORDER — AMLODIPINE BESYLATE 10 MG PO TABS: 10.0000 mg | ORAL_TABLET | Freq: Every day | ORAL | 3 refills | Status: DC

## 2016-07-24 NOTE — Patient Instructions (Signed)
I changed your amlodipine so you only have to take 1- '10mg'$  tablet daily. Follow up with me in 1 month. Continue your back exercises

## 2016-07-24 NOTE — Assessment & Plan Note (Addendum)
No red flags on exam or history. Pain stable. Continue home exercises.  No red flags on [place] controlled substance database Pain contract signed 10/2015. Pt was not able to give urine sample again today. Refilled tramadol x 1 month supply, pt must give urine sample for UDS or get blood at next appt.

## 2016-07-24 NOTE — Progress Notes (Signed)
Patient ID: Leah [name], female   DOB: April 10, 1950, 66 y.o.   MRN: [id number]    Subjective: CC:  Pain f/u HPI: Patient is a 66 y.o. female with a past medical history of rheumatic disease s/p MVR with pericardial tissue valve and CABG x 1 with left IMA to the LAD on 9/12, HTN, CAD presenting to clinic today for pain follow up.   Lumbar pain Since our last visit she now notes burning on plantar and sometimes the dorsal aspect of her feet x 3weeks.  Pain is still dull and pulling in the lumbar region, stable.  Pain worse with prolonged sitting or standing; also with ambulating up stairs. Taking tramadol once every few days which seems to help.   Went to PT once (all insurance would pay for) but has been doing stretches at home.  Patient denies radicular pain. No urinary or bowel incontinence. No saddle paresthesias.  Social History: back smoking, up to 0.5-1ppd   Health Maintenance: colonoscopy on 09/03/16 with f/u in 3 years. Pt noted to have pulmonary nodule that Dr. Servando Snare was following but later was thought to be infectious/reactive in nature. Was referred for lung cancer screening program but was later determined not to qualify.  ROS: All other systems reviewed and are negative besides that noted in HPI.   Past Medical History Patient Active Problem List   Diagnosis Date Noted  . Health care maintenance 04/17/2016  . Dizziness 04/17/2016  . Skin mass 01/17/2016  . Decreased visual acuity 01/17/2016  . Depressed mood 07/27/2015  . Weakness   . S/P MVR (mitral valve replacement) 12/05/2014  . Moderate aortic regurgitation 10/26/2014  . Renal vascular disease 10/26/2014  . Acute on chronic diastolic congestive heart failure ([place]) 10/21/2014  . Tobacco use 10/21/2014  . Diastolic dysfunction, grade 2 by echo June 2016 10/21/2014  . Carpal tunnel syndrome 10/17/2014  . Left shoulder pain 09/17/2014  . Pulmonary nodule, right-needs repeat CT in Dec 2016 09/17/2014  .  Abnormal CXR 08/25/2014  . Severe mitral regurgitation 08/25/2014  . Unstable angina ([place]) 08/22/2014  . Hematuria 07/01/2014  . Low back pain 07/05/2012  . Insomnia 07/05/2012  . PVC (premature ventricular contraction) 10/02/2011  . History of angioedema with ACE 2013 06/14/2011  . PVD- s/p multiple proceedures 06/04/2011  . Hyperlipidemia 09/28/2008  . Essential hypertension, benign 09/28/2008  . CAD S/P LAD DES 2009 with 70% ISR 08/24/14 09/28/2008     Medications- reviewed and updated  Objective: Office vital signs reviewed. BP 138/86 (BP Location: Left Arm, Patient Position: Sitting, Cuff Size: Normal)   Pulse 88   Temp 98.5 F (36.9 C) (Oral)   Ht 6' 1.5" (1.867 m)   Wt 184 lb 6.4 oz (83.6 kg)   BMI 24.00 kg/m   Physical Examination:  General: Awake, alert, well- nourished, NAD Cardio: Regular rate and rhythm. II/VI systolic murmur. No thrill noted.  Pulm: No increased WOB.  CTAB, without wheezes, rhonchi or crackles noted. 2+ DP pulses MSK: normal to inspection,  no tenderness to palpation. No pain with flexion or extension.  Negative SLR. 5/5 strength in the UE and LE bilaterally. Sensation intact in the LEs bilaterally. Normal DTRs.  Normal gait and station.   Assessment/Plan: Pulmonary nodule, right-needs repeat CT in Dec 2016 Patient did not qualify for lung cancer screening program, I ordered a low dose CT today for evaluation.    Low back pain No red flags on exam or history. Pain stable. Continue home exercises.  No red flags on [place] controlled substance database Pain contract signed 10/2015. Pt was not able to give urine sample again today. Refilled tramadol x 1 month supply, pt must give urine sample for UDS or get blood at next appt.     Archie Patten PGY-3, [place]

## 2016-07-24 NOTE — Assessment & Plan Note (Signed)
Patient did not qualify for lung cancer screening program, I ordered a low dose CT today for evaluation.

## 2016-07-29 ENCOUNTER — Ambulatory Visit (HOSPITAL_COMMUNITY)
Admission: RE | Admit: 2016-07-29 | Discharge: 2016-07-29 | Disposition: A | Discharge status: Home / Self Care | Payer: Medicaid Other | Source: Ambulatory Visit | Attending: Family Medicine | Admitting: Family Medicine

## 2016-07-29 DIAGNOSIS — R918 Other nonspecific abnormal finding of lung field: Secondary | ICD-10-CM | POA: Insufficient documentation

## 2016-07-29 DIAGNOSIS — Z122 Encounter for screening for malignant neoplasm of respiratory organs: Secondary | ICD-10-CM | POA: Diagnosis present

## 2016-07-29 DIAGNOSIS — F17210 Nicotine dependence, cigarettes, uncomplicated: Secondary | ICD-10-CM | POA: Insufficient documentation

## 2016-07-30 ENCOUNTER — Telehealth: Payer: Self-pay | Admitting: Family Medicine

## 2016-07-30 NOTE — Telephone Encounter (Signed)
Attempted to call pt with lung CT results, no answer.  Will try again given the result findings.

## 2016-07-30 NOTE — Telephone Encounter (Signed)
Called pt to discuss results. No answer. Will try again.   CT from 09/21/15 revealed improvement in R upper lobe nodule, then measured at 46m.  Repeat imaging showed a 9.8 mm spiculated nodule in the anterior right upper lobe. Category 4A, suspicious.    Follow up low-dose chest CT without contrast in 3 months (please use the following order, "CT CHEST LCS NODULE FOLLOW-UP W/O CM") is recommended. Alternatively, PET may be considered.  Will attempt to call again and/or will discuss at our next appt within the month.  CArchie Patten MD [facility]  07/30/2016, 4:56 PM

## 2016-08-05 ENCOUNTER — Telehealth: Payer: Self-pay | Admitting: Family Medicine

## 2016-08-05 NOTE — Telephone Encounter (Signed)
Called pt to discuss CT results. She'll need f/u CT in 3 months. She voiced understanding.   Archie Patten, MD [facility] Family Medicine Resident  08/05/2016, 8:26 AM

## 2016-08-26 ENCOUNTER — Ambulatory Visit (INDEPENDENT_AMBULATORY_CARE_PROVIDER_SITE_OTHER): Payer: Medicaid Other | Admitting: Family Medicine

## 2016-08-26 VITALS — BP 130/70 | HR 68 | Temp 98.4°F | Wt 183.0 lb

## 2016-08-26 DIAGNOSIS — R1310 Dysphagia, unspecified: Secondary | ICD-10-CM | POA: Diagnosis not present

## 2016-08-26 DIAGNOSIS — I10 Essential (primary) hypertension: Secondary | ICD-10-CM | POA: Diagnosis not present

## 2016-08-26 DIAGNOSIS — G8929 Other chronic pain: Secondary | ICD-10-CM | POA: Diagnosis not present

## 2016-08-26 DIAGNOSIS — Z720 Tobacco use: Secondary | ICD-10-CM

## 2016-08-26 DIAGNOSIS — M545 Low back pain, unspecified: Secondary | ICD-10-CM

## 2016-08-26 MED ORDER — TRAMADOL HCL 50 MG PO TABS: 50.0000 mg | ORAL_TABLET | Freq: Four times a day (QID) | ORAL | 0 refills | Status: DC | PRN

## 2016-08-26 NOTE — Patient Instructions (Signed)
It was great to see you.  If you still note difficulty swallowing over the next week, call my office and I'll refer you to the GI doctor.  Follow up with you new PCP in 6-8 weeks.

## 2016-08-26 NOTE — Assessment & Plan Note (Signed)
Stable  Continue current regimen  

## 2016-08-26 NOTE — Assessment & Plan Note (Signed)
Most likely due to recent sore throat. Stable over the last few days. We'll continue to monitor over the next week.  If no improvement or the patient notes worsening, will refer to GI for possible EGD.

## 2016-08-26 NOTE — Progress Notes (Signed)
Patient ID: SHAKEETA GODETTE, female   DOB: 04-Jan-1951, 66 y.o.   MRN: [id number]    Subjective: CC:  Pain f/u HPI: Patient is a 66 y.o. female with a past medical history of rheumatic disease s/p MVR with pericardial tissue valve and CABG x 1 with left IMA to the LAD on 9/12, HTN, CAD presenting to clinic today for pain follow up.   Lumbar pain Still having burning on plantar aspect of feet intermittently Not associated with anything in particular.  Pain is still dull / pulling in the lumbar region, stable.  Pain worse with prolonged sitting or standing Taking tramadol once every few days which seems to help.   Went to PT once (all insurance would pay for) but still doing stretches at home.  Patient denies radicular pain. No urinary or bowel incontinence. No saddle paresthesias.  Dysphagia: Had a sore throat 8 days ago. Sore throat has resolved. Now having problems swallowing, mostly solids Stable over the last week. No drooling No fevers / chills  Social History: smoking 10 cigs/week  Health Maintenance: colonoscopy on 09/03/16 with f/u in 3 years. Pt noted to have pulmonary nodule that Dr. Servando Snare was following but later was thought to be infectious/reactive in nature. Was referred for lung cancer screening program but was later determined not to qualify.  ROS: All other systems reviewed and are negative besides that noted in HPI.   Past Medical History Patient Active Problem List   Diagnosis Date Noted  . Dysphagia 08/26/2016  . Health care maintenance 04/17/2016  . Dizziness 04/17/2016  . Skin mass 01/17/2016  . Decreased visual acuity 01/17/2016  . Depressed mood 07/27/2015  . S/P MVR (mitral valve replacement) 12/05/2014  . Moderate aortic regurgitation 10/26/2014  . Renal vascular disease 10/26/2014  . Acute on chronic diastolic congestive heart failure ([place]) 10/21/2014  . Tobacco use 10/21/2014  . Diastolic dysfunction, grade 2 by echo June 2016 10/21/2014    . Carpal tunnel syndrome 10/17/2014  . Left shoulder pain 09/17/2014  . Pulmonary nodule, right-needs repeat CT in Dec 2016 09/17/2014  . Abnormal CXR 08/25/2014  . Severe mitral regurgitation 08/25/2014  . Unstable angina ([place]) 08/22/2014  . Hematuria 07/01/2014  . Low back pain 07/05/2012  . Insomnia 07/05/2012  . PVC (premature ventricular contraction) 10/02/2011  . History of angioedema with ACE 2013 06/14/2011  . PVD- s/p multiple proceedures 06/04/2011  . Hyperlipidemia 09/28/2008  . Essential hypertension, benign 09/28/2008  . CAD S/P LAD DES 2009 with 70% ISR 08/24/14 09/28/2008     Medications- reviewed and updated  Objective: Office vital signs reviewed. BP 130/70   Pulse 68   Temp 98.4 F (36.9 C) (Oral)   Wt 183 lb (83 kg)   SpO2 98%   BMI 23.82 kg/m   Physical Examination:  General: Awake, alert, well- nourished, NAD Cardio: Regular rate and rhythm. II/VI systolic murmur. No thrill noted.  Pulm: No increased WOB.  CTAB, without wheezes, rhonchi or crackles noted. 2+ DP pulses MSK: normal to inspection,  no tenderness to palpation. No pain with flexion or extension; actually notes some improvement with extension.  Negative SLR. 5/5 strength in the UE and LE bilaterally. Sensation intact in the LEs bilaterally. Normal DTRs.  Normal gait and station.   Assessment/Plan: Essential hypertension, benign Stable. Continue current regimen   Tobacco use Discussed cessation  Low back pain No red flags on exam or history. Stable symptoms. No labs on review of the [place] on a controlled substance  database Contract present from August 2017. UDS ordered today. Tramadol 30 mg for 1 month supply with 1 refill ordered Patient to follow-up with her new PCP in 6 weeks.  Dysphagia Most likely due to recent sore throat. Stable over the last few days. We'll continue to monitor over the next week.  If no improvement or the patient notes worsening, will refer to GI for  possible EGD.    Archie Patten PGY-3, [place]

## 2016-08-26 NOTE — Assessment & Plan Note (Signed)
No red flags on exam or history. Stable symptoms. No labs on review of the [place] on a controlled substance database Contract present from August 2017. UDS ordered today. Tramadol 30 mg for 1 month supply with 1 refill ordered Patient to follow-up with her new PCP in 6 weeks.

## 2016-08-26 NOTE — Assessment & Plan Note (Signed)
Discussed cessation 

## 2016-09-26 ENCOUNTER — Ambulatory Visit (INDEPENDENT_AMBULATORY_CARE_PROVIDER_SITE_OTHER): Payer: Medicaid Other | Admitting: Cardiothoracic Surgery

## 2016-09-26 ENCOUNTER — Encounter: Payer: Self-pay | Admitting: Cardiothoracic Surgery

## 2016-09-26 VITALS — BP 118/78 | HR 74 | Resp 20 | Ht 73.5 in | Wt 180.0 lb

## 2016-09-26 DIAGNOSIS — R911 Solitary pulmonary nodule: Secondary | ICD-10-CM | POA: Diagnosis not present

## 2016-09-26 NOTE — Progress Notes (Signed)
[address]       [place] [address]             [phone]      Ambry F Greeley Rewey Medical Record #[id number] Date of Birth: 12-28-1950  Referring: Martinique, Peter M, MD Primary Care: Guadalupe Dawn, MD  Chief Complaint:   POST OP FOLLOW UP 12/05/2014  OPERATIVE REPORT PREOPERATIVE DIAGNOSES: 1. Severe mitral insufficiency. 2. Aortic insufficiency. 3. Coronary occlusive disease. POSTOPERATIVE DIAGNOSES: 1. Severe mitral insufficiency. 2. Aortic insufficiency. 3. Coronary occlusive disease. 4. Aortic insufficiency, mild. PROCEDURE PERFORMED: Mitral valve replacement with [facility] pericardial tissue valve, model [id number] 29 mm, serial #[id number] and coronary artery bypass grafting x1 with the left internal mammary to the left anterior descending coronary artery. Closure of left atrial appendage. SURGEON: Lanelle Bal, MD.  History of Present Illness:     Patient returns office for follow-up evaluation after mitral valve replacement for severe mitral insufficiency September 2016. She has been followed for a small abnormality in the right upper lobe. A follow-up CT scan was done in May of this year.    Patient is no longer smoking   Past Medical History:  Diagnosis Date  . Anemia   . CAD (coronary artery disease)   . Carotid artery occlusion   . COPD (chronic obstructive pulmonary disease) ([place])   . GERD (gastroesophageal reflux disease)   . Headache(784.0)   . Hyperlipidemia   . Hypertension   . Leg pain   . Peripheral vascular disease ([place])   . Renal vascular disease 10/26/2014   bilateral stents placed   . Severe mitral regurgitation   . Shortness of breath dyspnea   . Tobacco abuse      History  Smoking Status  . Current Every Day Smoker  . Packs/day: 0.50  . Years: 35.00  . Types: Cigarettes  . Last attempt to quit: 10/31/2014  Smokeless Tobacco  . Never Used    Comment: has not started wellbutrin yet     History  Alcohol Use No     Allergies  Allergen Reactions  . Lisinopril Swelling    Angioedema 06/10/11  . Aspirin Other (See Comments)    Upset stomach  . Chantix [Varenicline]     insomnia  . Penicillins Hives    Current Outpatient Prescriptions  Medication Sig Dispense Refill  . albuterol (PROVENTIL HFA;VENTOLIN HFA) 108 (90 Base) MCG/ACT inhaler Inhale 2 puffs into the lungs every 6 (six) hours as needed for wheezing or shortness of breath. 1 Inhaler 0  . amLODipine (NORVASC) 10 MG tablet Take 1 tablet (10 mg total) by mouth daily. 90 tablet 3  . aspirin EC 81 MG tablet Take 1 tablet (81 mg total) by mouth daily. 90 tablet 1  . atorvastatin (LIPITOR) 80 MG tablet Take 1 tablet (80 mg total) by mouth daily. 30 tablet 2  . cyclobenzaprine (FLEXERIL) 10 MG tablet Take 1 tablet (10 mg total) by mouth 3 (three) times daily as needed. for muscle spams 70 tablet 2  . metoprolol tartrate (LOPRESSOR) 25 MG tablet Take 1 tablet (25 mg total) by mouth 2 (two) times daily. 60 tablet 2  . nicotine (NICODERM CQ - DOSED IN MG/24 HOURS) 14 mg/24hr patch Place 1 patch (14 mg total) onto the skin daily. 28 patch 2  . nitroGLYCERIN (NITROSTAT) 0.4 MG SL tablet Place 1 tablet (0.4 mg total) under the tongue every 5 (five) minutes x 3 doses as needed for chest  pain. 25 tablet 12  . traMADol (ULTRAM) 50 MG tablet Take 1-2 tablets (50-100 mg total) by mouth every 6 (six) hours as needed. for pain 30 tablet 0  . traZODone (DESYREL) 100 MG tablet Take 1 tablet (100 mg total) by mouth at bedtime as needed for sleep. 30 tablet 2   Current Facility-Administered Medications  Medication Dose Route Frequency Provider Last Rate Last Dose  . 0.9 %  sodium chloride infusion  500 mL Intravenous Continuous Armbruster, Renelda Loma, MD      . 0.9 %  sodium chloride infusion  500 mL Intravenous Continuous Havery Moros, Renelda Loma, MD           Physical Exam: BP 118/78   Pulse 74   Resp 20   Ht 6' 1.5"  (1.867 m)   Wt 180 lb (81.6 kg)   SpO2 98% Comment: RA  BMI 23.43 kg/m   Physical Exam  Constitutional: No distress.  HENT:  Mouth/Throat: No oropharyngeal exudate.  Eyes: Right eye exhibits no discharge. Left eye exhibits no discharge. No scleral icterus.  Neck: No JVD present. No tracheal deviation present. No thyromegaly present.  Cardiovascular: Normal rate and regular rhythm.  Exam reveals no friction rub.   No murmur heard. Respiratory: No stridor. No respiratory distress. She has no wheezes. She has no rales. She exhibits no tenderness.  GI: She exhibits no distension. There is no tenderness. There is no rebound.  Musculoskeletal: She exhibits no edema, tenderness or deformity.  Lymphadenopathy:    She has no cervical adenopathy.  Skin: Skin is warm and dry. She is not diaphoretic.  Psychiatric: She has a normal mood and affect. Her behavior is normal. Judgment and thought content normal.    Diagnostic Studies & Laboratory data:     Recent Radiology Findings:  Study Result   CLINICAL DATA:  66 year old female with greater than 30 pack-year history of smoking, for initial lung cancer screening  EXAM: CT CHEST WITHOUT CONTRAST LOW-DOSE FOR LUNG CANCER SCREENING  TECHNIQUE: Multidetector CT imaging of the chest was performed following the standard protocol without IV contrast.  COMPARISON:  Multiple prior CTs, most recently 09/21/2015  FINDINGS: Cardiovascular: The heart is normal in size. No pericardial effusion. Prosthetic mitral valve.  Three vessel coronary atherosclerosis.  No evidence of thoracic aortic aneurysm. Atherosclerotic calcifications of the aortic arch.  Mediastinum/Nodes: No suspicious mediastinal lymphadenopathy.  Visualized thyroid is unremarkable.  Lungs/Pleura: 9.8 mm (volumetric mean) spiculated nodule in the anterior right upper lobe (series 3/ image 65). This has significantly progressed from the prior studies.  Additional  scattered small bilateral pulmonary nodules measuring up to 3.8 mm.  3.2 mm calcified granuloma in the right upper lobe.  Underlying mild centrilobular and paraseptal emphysematous changes, upper lobe predominant.  Mild biapical pleural-parenchymal scarring.  Mild linear scarring/ atelectasis in the left lower lobe. No focal consolidation.  No pleural effusion or pneumothorax.  Upper Abdomen: Visualized upper abdomen is unremarkable.  Musculoskeletal: Median sternotomy.  IMPRESSION: 9.8 mm spiculated nodule in the anterior right upper lobe. Lung-RADS Category 4A, suspicious. Follow up low-dose chest CT without contrast in 3 months (please use the following order, "CT CHEST LCS NODULE FOLLOW-UP W/O CM") is recommended. Alternatively, PET may be considered.   Electronically Signed   By: Julian Hy M.D.   On: 07/29/2016 14:24     Ct Chest Wo Contrast  09/21/2015  CLINICAL DATA:  Follow-up pulmonary nodule.  Current smoker. EXAM: CT CHEST WITHOUT CONTRAST TECHNIQUE: Multidetector CT imaging of  the chest was performed following the standard protocol without IV contrast. COMPARISON:  06/08/2015 chest CT. FINDINGS: Mediastinum/Nodes: Mild cardiomegaly. No significant pericardial fluid/thickening. Left anterior, left circumflex and right coronary atherosclerosis status post CABG with left internal mammary bypass graft. Mitral valve prosthesis is in place. Atherosclerotic nonaneurysmal thoracic aorta. Normal caliber pulmonary arteries. No discrete thyroid nodules. Unremarkable esophagus. No pathologically enlarged axillary, mediastinal or gross hilar lymph nodes, noting limited sensitivity for the detection of hilar adenopathy on this noncontrast study. Lungs/Pleura: No pneumothorax. No pleural effusion. Mild centrilobular emphysema and mild diffuse bronchial wall thickening. Apical right upper lobe 3 mm solid pulmonary nodule (series 4/ image 31) has continued to decrease  in size, previously 5 mm on 06/08/2015 using similar measurement technique. Additional scattered 2-3 mm centrilobular pulmonary nodules in the upper lungs bilaterally are not appreciably changed and are considered benign. No acute consolidative airspace disease or new significant pulmonary nodules. Stable parenchymal band in the anterior left lower lobe. Upper abdomen: Partially visualized bilateral proximal renal artery stents. Musculoskeletal: No aggressive appearing focal osseous lesions. Sternotomy wires appear aligned and intact. IMPRESSION: 1. Continued reduction in the size of the apical right upper lobe pulmonary nodule, now 3 mm, consistent with resolving inflammatory nodule. 2. Mild centrilobular emphysema and mild diffuse bronchial wall thickening, suggesting COPD. No acute pulmonary disease. 3. Three-vessel coronary atherosclerosis status post CABG. Stable mild cardiomegaly. Electronically Signed   By: Ilona Sorrel M.D.   On: 09/21/2015 11:10   I have independently reviewed the above radiology studies  and reviewed the findings with the patient.   Ct Super D Chest Wo Contrast  06/08/2015  CLINICAL DATA:  Followup of right apical pulmonary nodule. Prior CABG and valve replacement. Ex-smoker. EXAM: CT CHEST WITHOUT CONTRAST TECHNIQUE: Multidetector CT imaging of the chest was performed using thin slice collimation for electromagnetic bronchoscopy planning purposes, without intravenous contrast. COMPARISON:  03/09/2015 and 10/27/2014. FINDINGS: Mediastinum/Lymph Nodes: Aortic and branch vessel atherosclerosis. Moderate cardiomegaly with mitral valve repair. Prior median sternotomy. No mediastinal or definite hilar adenopathy, given limitations of unenhanced CT. Lungs/Pleura: No pleural fluid.  Mild centrilobular emphysema. The right apical complex nodule is less well-defined today. Solid component measures 4 mm on image 17/series 4 versus 5 mm on the prior. nodule in total measures 8 mm today versus  12 mm on the prior. On sagittal reformats today, 4 mm soft tissue component on image 43 versus 5 mm on the prior. 9 mm overall lesion size today versus 11 mm on the prior (when remeasured). Scarring at the left lung base. Upper abdomen: Normal imaged portions of the liver, spleen, stomach, pancreas, gallbladder, adrenal glands, kidneys. Bilateral renal artery stents. Musculoskeletal: No acute osseous abnormality. IMPRESSION: 1. Decreased size/definition of right apical pulmonary nodule. This suggests resolving infectious or inflammatory etiology. Recommend imaging surveillance with at least 1 more 3- 6 month follow-up. 2.  No acute process in the chest. 3. Cardiomegaly with prior median sternotomy for CABG and mitral valve repair. Electronically Signed   By: Abigail Miyamoto M.D.   On: 06/08/2015 12:37    Ct Chest Wo Contrast  03/09/2015  CLINICAL DATA:  Followup pulmonary nodule. EXAM: CT CHEST WITHOUT CONTRAST TECHNIQUE: Multidetector CT imaging of the chest was performed following the standard protocol without IV contrast. COMPARISON:  Chest CT 10/27/2014 and 08/25/2014 FINDINGS: Mediastinum/Nodes: No breast masses, supraclavicular or axillary lymphadenopathy. The thyroid gland is grossly normal. The heart is normal in size. No pericardial effusion. Stable tortuosity, ectasia and calcifications involving the  thoracic aorta. Prosthetic aortic valve is noted along with coronary artery bypass grafts. No mediastinal or hilar mass or adenopathy. The esophagus is grossly normal. Lungs/Pleura: The lungs are clear of acute process. Stable emphysematous changes. Again demonstrated is a right upper lobe pulmonary lesion which is partly solid and partly cystic. The solid component measures slightly larger on the coronal images. It measures 7.5 mm on image number 53 of series 601. It previously measured 6 mm. The cystic component has a thicker ram than on the prior examinations. The largest measurement on the axial images  of 11.5 mm and was previously 10.5 mm. Could not exclude a slow growing neoplasm. PET-CT may be helpful for further evaluation. It would not be an reasonable to continue to follow this with CT with a repeat CT scan in 4-6 months. No new regions.  No acute pulmonary findings.  No pleural effusion. Upper abdomen: No significant upper abdominal findings. Musculoskeletal: No significant bony findings. IMPRESSION: Slight interval increase in size of the right apical lung lesion as described above. Continued CT follow-up (4-6 months) or PET-CT are both reasonable. No new pulmonary lesions or acute pulmonary findings. Stable emphysematous changes. No mediastinal or hilar mass or adenopathy. Electronically Signed   By: Marijo Sanes M.D.   On: 03/09/2015 13:30       CLINICAL DATA: Re-evaluate right upper lobe pulmonary nodule, prior to possible valve surgery. Initial encounter.  EXAM: CT CHEST WITHOUT CONTRAST  TECHNIQUE: Multidetector CT imaging of the chest was performed following the standard protocol without IV contrast.  COMPARISON: CT of the chest performed 08/25/2014  FINDINGS: The small slightly spiculated nodule at the right lung apex is again seen; there is a small associated cystic component which appears to have increased mildly in size, while the solid component has decreased slightly in size, measuring approximately 5 mm. Given the slight apparent interval decrease in size of the solid component from the recent prior study, this is thought more likely to be postinfectious in nature, though malignancy cannot be entirely excluded.  No additional pulmonary nodules are seen. The lungs are otherwise clear, aside from mild scarring or atelectasis at the left lung base. No pleural effusion or pneumothorax is seen.  Scattered coronary artery calcifications are noted. The mediastinum is otherwise unremarkable. No mediastinal lymphadenopathy is seen. No pericardial effusion is  identified. The great vessels are grossly unremarkable, aside from scattered calcification. Mild calcification is noted along the thoracic aorta. The visualized portions of the thyroid gland are unremarkable. No axillary lymphadenopathy is appreciated.  No acute osseous abnormalities are identified.  IMPRESSION: 1. Small slightly spiculated nodule at the right lung apex is again seen. It has a small cystic component which appears to have increased mildly in size, while the solid component has decreased slightly in size, measuring approximately 5 mm. Given the slight apparent interval decrease in size of the solid component from the recent prior CT, this is thought more likely to be postinfectious in nature, though malignancy cannot be entirely excluded. As this remains too small to further characterize, would recommend strict follow-up CT of the chest in 3-6 months, to ensure stability. 2. Mild scarring or atelectasis at the left lung base. 3. Scattered coronary artery calcifications seen.   Electronically Signed  By: Garald Balding M.D.  On: 10/27/2014 19:40  Recent Lab Findings: Lab Results  Component Value Date   WBC 11.1 (H) 01/15/2016   HGB 14.0 01/15/2016   HCT 41.7 01/15/2016   PLT 170 01/15/2016  GLUCOSE 84 01/15/2016   CHOL 264 (H) 01/15/2016   TRIG 118 01/15/2016   HDL 47 01/15/2016   LDLDIRECT 185 (A) 09/09/2011   LDLCALC 193 (H) 01/15/2016   ALT 12 (L) 11/28/2014   AST 16 11/28/2014   NA 141 01/15/2016   K 3.7 01/15/2016   CL 107 01/15/2016   CREATININE 0.99 01/15/2016   BUN 12 01/15/2016   CO2 26 01/15/2016   TSH 1.756 11/28/2014   INR 1.8 02/27/2015   HGBA1C 5.6 01/15/2016   ECHO: 03/10/2015 Study Conclusions  - Left ventricle: The cavity size was normal. Wall thickness was normal. Systolic function was mildly reduced. The estimated ejection fraction was in the range of 45% to 50%. There is hypokinesis of the basal-mid septal  myocardium. - Aortic valve: There was moderate regurgitation. - Mitral valve: A bioprosthesis was present. - Left atrium: The atrium was mildly dilated.  Impressions:  - Basal septal hypokinesis with mildly reduced LV function; aortic valve not well visualized but probable moderate AI (2+); s/p MVR with no obvious MR; trace TR.  Transthoracic echocardiography. M-mode, complete 2D, spectral Doppler, and color Doppler. Birthdate: Patient birthdate: May 12, 1950. Age: Patient is 65 yr old. Sex: Gender: female. BMI: 23.9 kg/m^2. Blood pressure:   106/64 Patient status: Outpatient. Study date: Study date: 03/10/2015. Study time: 02:26 PM. Location: [place]  -------------------------------------------------------------------  ------------------------------------------------------------------- Left ventricle: The cavity size was normal. Wall thickness was normal. Systolic function was mildly reduced. The estimated ejection fraction was in the range of 45% to 50%. Regional wall motion abnormalities: There is hypokinesis of the basal-mid septal myocardium.  ------------------------------------------------------------------- Aortic valve: Poorly visualized. Doppler: Transvalvular velocity was within the normal range. There was no stenosis. There was moderate regurgitation.  ------------------------------------------------------------------- Aorta: Aortic root: The aortic root was normal in size.  ------------------------------------------------------------------- Mitral valve: A bioprosthesis was present. Doppler: There was no regurgitation.  Peak gradient (D): 8 mm Hg.  ------------------------------------------------------------------- Left atrium: The atrium was mildly dilated.  ------------------------------------------------------------------- Right ventricle: The cavity size was normal. Systolic function  was normal.  ------------------------------------------------------------------- Pulmonic valve:  Doppler: Transvalvular velocity was within the normal range. There was no evidence for stenosis.  ------------------------------------------------------------------- Tricuspid valve:  Structurally normal valve.  Doppler: Transvalvular velocity was within the normal range. There was trivial regurgitation.  ------------------------------------------------------------------- Pulmonary artery:  Systolic pressure was within the normal range.  ------------------------------------------------------------------- Right atrium: The atrium was normal in size.  ------------------------------------------------------------------- Pericardium: There was no pericardial effusion.  ------------------------------------------------------------------- Systemic veins: Inferior vena cava: The vessel was normal in size.   Assessment / Plan: In June 2016 she was noted to have a very small right upper lobe lung nodule preoperatively a repeat scan was done in August, that showed the solid component to decrease in size. On previous  CT of the chest  right upper lobe mass Decreased size/definition of right apical pulmonary nodule. This suggests resolving infectious or inflammatory etiology  A low dose screening CT was done in May of this year, suggesting that the nodule was now increasing in size and follow-up in 3 months was recommended. I discussed this with the patient and we'll plan to proceed in August with a super D CT of the chest to allow possible navigation bronchoscopy and biopsy of the mass if appropriate. With the small size and deep in the upper lobe CT directed needle biopsy may be difficult.  I reviewed the CT findings with patient and she is cleared to return in August with a follow-up CT  She was reminded of the precautions concerning prosthetic valve and endocarditis.    [name] MD      [address] [place] [address] Office [phone]   Beeper [phone]  09/26/2016 4:22 PM

## 2016-10-10 ENCOUNTER — Other Ambulatory Visit: Payer: Self-pay | Admitting: *Deleted

## 2016-10-10 DIAGNOSIS — R911 Solitary pulmonary nodule: Secondary | ICD-10-CM

## 2016-10-22 ENCOUNTER — Other Ambulatory Visit: Payer: Self-pay | Admitting: Family Medicine

## 2016-10-22 DIAGNOSIS — M545 Low back pain: Principal | ICD-10-CM

## 2016-10-22 DIAGNOSIS — G8929 Other chronic pain: Secondary | ICD-10-CM

## 2016-10-22 NOTE — Telephone Encounter (Signed)
Patient calling to request refill of:  Name of Medication(s): Flexeril, Tramadol   Last date of OV:  09/26/16 (scheduled to see pcp 8/10) Pharmacy:  [facility]  Will route refill request to Clinic RN.  Discussed with patient policy to call pharmacy for future refills.  Also, discussed refills may take up to 48 hours to approve or deny.  [place]

## 2016-10-23 NOTE — Telephone Encounter (Signed)
2nd request.  Martin, Tamika L, RN  

## 2016-10-24 ENCOUNTER — Other Ambulatory Visit: Payer: Self-pay | Admitting: *Deleted

## 2016-10-24 NOTE — Telephone Encounter (Signed)
3rd request. ep

## 2016-10-24 NOTE — Telephone Encounter (Signed)
2nd request. ep °

## 2016-10-25 MED ORDER — CYCLOBENZAPRINE HCL 10 MG PO TABS: 10.0000 mg | ORAL_TABLET | Freq: Three times a day (TID) | ORAL | 2 refills | Status: DC | PRN

## 2016-10-25 NOTE — Telephone Encounter (Signed)
Patient was given a short supply at last clinic visit and told to follow up with pcp to get script renewed. She will need to schedule an appointment with me or another provider for the ultram. I don't mind refilling the flexeril.

## 2016-11-01 ENCOUNTER — Encounter: Payer: Self-pay | Admitting: Family Medicine

## 2016-11-01 ENCOUNTER — Ambulatory Visit (INDEPENDENT_AMBULATORY_CARE_PROVIDER_SITE_OTHER): Payer: Medicaid Other | Admitting: Family Medicine

## 2016-11-01 DIAGNOSIS — I10 Essential (primary) hypertension: Secondary | ICD-10-CM

## 2016-11-01 DIAGNOSIS — R911 Solitary pulmonary nodule: Secondary | ICD-10-CM

## 2016-11-01 DIAGNOSIS — Z0000 Encounter for general adult medical examination without abnormal findings: Secondary | ICD-10-CM | POA: Diagnosis not present

## 2016-11-01 DIAGNOSIS — M545 Low back pain: Secondary | ICD-10-CM

## 2016-11-01 DIAGNOSIS — Z720 Tobacco use: Secondary | ICD-10-CM | POA: Diagnosis not present

## 2016-11-01 DIAGNOSIS — G8929 Other chronic pain: Secondary | ICD-10-CM | POA: Diagnosis not present

## 2016-11-01 MED ORDER — NICOTINE 14 MG/24HR TD PT24: 14.0000 mg | MEDICATED_PATCH | Freq: Every day | TRANSDERMAL | 2 refills | Status: DC

## 2016-11-01 MED ORDER — TRAMADOL HCL 50 MG PO TABS: 50.0000 mg | ORAL_TABLET | Freq: Four times a day (QID) | ORAL | 0 refills | Status: DC | PRN

## 2016-11-01 NOTE — Patient Instructions (Addendum)
At today's visit we talked about sore throat, blood pressure, smoking, back pain, and health maintenance topics including a pap smear and a mammogram. You received a refill of your tramadol for around 1 month. Please keep your follow up appointment with Dr. Servando Snare, and your appointment to get a repeat ct scan. Please plan to follow up with me in around 1 month to discuss your visit with dr. Servando Snare and to get a pap smear.

## 2016-11-07 NOTE — Assessment & Plan Note (Signed)
Patient doing very well on current regimen. Consistently in 155-208 systolic range at home check. Very slightly elevated on initial presentation to clinic today. Will plan to continue current regimen amlodipine 10mg , metoprolol 25mg . Will plan for her to follow up in one month as detailed below and will recheck at that time. - continue current regimen

## 2016-11-07 NOTE — Progress Notes (Signed)
HPI 66 year old female who presented to clinic to meet a new provider. Issues that had previously been discussed with previous provider included a sore throat, hypertension, and low back pain. She states that all of these issues are going well and have all either improved or she simply doesn't think about them very often. She states that her sore throat has completely resolved and that she is not having any trouble swallowing as she was back in June. Her blood pressure is 142/80 today, and she states that is has usually been in the 756E and 332R systolic at home. Her back pain has been doing better with stretching, but that she still occasionally needs her tramadol and flexeril for relief.  Other topics that were discussed were health maintenance items that are due such as a mammogram and her pap smear. We also discussed tobacco cessation. She is currently only smoking about a 1/3 of a pack per day and has slowly been weaning herself off.  This is because of some concern about a lung nodule that has been discovered and has been under surveillance with serial ct scans. She has a follow up appointment with dr. Servando Snare at the end of the month after she gets another ct scan.  CC: "meet new provider"   ROS: Review of Systems  Constitutional: Negative for chills and fever.  HENT: Negative for hearing loss and tinnitus.   Eyes: Negative for pain, discharge and redness.  Respiratory: Negative for cough and hemoptysis.   Cardiovascular: Negative for chest pain and palpitations.  Gastrointestinal: Negative for constipation, diarrhea, heartburn, nausea and vomiting.  Genitourinary: Negative for dysuria and urgency.  Musculoskeletal: Negative for myalgias and neck pain.  Skin: Negative for itching and rash.  Neurological: Negative for dizziness and headaches.  Psychiatric/Behavioral: Negative for depression and suicidal ideas.     Review of Systems See HPI for ROS.   CC, SH/smoking status, and VS  noted  Objective: BP (!) 142/78   Pulse 72   Temp 98.3 F (36.8 C) (Oral)   Ht 6' 1.5" (1.867 m)   Wt 178 lb (80.7 kg)   SpO2 97%   BMI 23.17 kg/m  Gen: NAD, alert, cooperative, and pleasant HEENT: NCAT, EOMI, PERRL CV: RRR, no murmur Resp: CTAB, no wheezes, non-labored Abd: SNTND, BS present, no guarding or organomegaly Ext: No edema, warm Neuro: Alert and oriented, Speech clear, No gross deficits   Assessment and plan:  Essential hypertension, benign Patient doing very well on current regimen. Consistently in 518-841 systolic range at home check. Very slightly elevated on initial presentation to clinic today. Will plan to continue current regimen amlodipine 10mg , metoprolol 25mg . Will plan for her to follow up in one month as detailed below and will recheck at that time. - continue current regimen   Health care maintenance Patient is overdue for pap smear and for mammogram. Gave her mammogram card and informed her she would need to schedule her own appointment.  Will plan to have patient follow up in one month for pap smear as well as to discuss her upcoming visit with Dr. Servando Snare about her pulmonary nodule. Patient had colonoscopy in 05/2016 noted. - plan for pap smear in one month - patient given information on mammogram  Tobacco use Discussed cessation. Patient has made good progress on quitting, going from 1 PPD to 1/3 PPD. Gave new prescription for nicotine patch. Will discuss further progress at f/u in one month.  Pulmonary nodule, right-needs repeat CT in Dec  2016 Patient to have f/u surveillance CT performed at end of august and has follow up with Dr. Servando Snare around that time. Will plan to see patient in one month and discuss these results at that time  Low back pain No red flags on exam. Patient still occasionally needs to relief. Will give prescription of tramadol for one more month. Will discuss discontinuation at next clinic appointment in one month.   No  orders of the defined types were placed in this encounter.   Meds ordered this encounter  Medications  . traMADol (ULTRAM) 50 MG tablet    Sig: Take 1-2 tablets (50-100 mg total) by mouth every 6 (six) hours as needed. for pain    Dispense:  30 tablet    Refill:  0  . DISCONTD: nicotine (NICODERM CQ - DOSED IN MG/24 HOURS) 14 mg/24hr patch    Sig: Place 1 patch (14 mg total) onto the skin daily.    Dispense:  28 patch    Refill:  2  . nicotine (NICODERM CQ - DOSED IN MG/24 HOURS) 14 mg/24hr patch    Sig: Place 1 patch (14 mg total) onto the skin daily.    Dispense:  28 patch    Refill:  2     Guadalupe Dawn MD PGY-1 Family Medicine Resident 11/07/2016 12:06 PM

## 2016-11-07 NOTE — Assessment & Plan Note (Signed)
No red flags on exam. Patient still occasionally needs to relief. Will give prescription of tramadol for one more month. Will discuss discontinuation at next clinic appointment in one month.

## 2016-11-07 NOTE — Assessment & Plan Note (Signed)
Patient is overdue for pap smear and for mammogram. Gave her mammogram card and informed her she would need to schedule her own appointment.  Will plan to have patient follow up in one month for pap smear as well as to discuss her upcoming visit with Dr. Servando Snare about her pulmonary nodule. Patient had colonoscopy in 05/2016 noted. - plan for pap smear in one month - patient given information on mammogram

## 2016-11-07 NOTE — Assessment & Plan Note (Signed)
Patient to have f/u surveillance CT performed at end of august and has follow up with Dr. Servando Snare around that time. Will plan to see patient in one month and discuss these results at that time

## 2016-11-07 NOTE — Assessment & Plan Note (Signed)
Discussed cessation. Patient has made good progress on quitting, going from 1 PPD to 1/3 PPD. Gave new prescription for nicotine patch. Will discuss further progress at f/u in one month.

## 2016-11-12 ENCOUNTER — Ambulatory Visit
Admission: RE | Admit: 2016-11-12 | Discharge: 2016-11-12 | Disposition: A | Discharge status: Home / Self Care | Payer: Medicaid Other | Source: Ambulatory Visit | Attending: Cardiothoracic Surgery | Admitting: Cardiothoracic Surgery

## 2016-11-12 ENCOUNTER — Other Ambulatory Visit: Payer: Self-pay | Admitting: Cardiothoracic Surgery

## 2016-11-12 ENCOUNTER — Ambulatory Visit: Payer: Medicaid Other | Admitting: Cardiothoracic Surgery

## 2016-11-12 DIAGNOSIS — R911 Solitary pulmonary nodule: Secondary | ICD-10-CM

## 2016-11-13 ENCOUNTER — Other Ambulatory Visit: Payer: Medicaid Other

## 2016-11-13 ENCOUNTER — Ambulatory Visit: Payer: Medicaid Other | Admitting: Cardiothoracic Surgery

## 2016-11-14 ENCOUNTER — Encounter: Payer: Self-pay | Admitting: Cardiothoracic Surgery

## 2016-11-14 ENCOUNTER — Ambulatory Visit (INDEPENDENT_AMBULATORY_CARE_PROVIDER_SITE_OTHER): Payer: Medicaid Other | Admitting: Cardiothoracic Surgery

## 2016-11-14 ENCOUNTER — Ambulatory Visit: Payer: Medicaid Other | Admitting: Cardiothoracic Surgery

## 2016-11-14 ENCOUNTER — Other Ambulatory Visit: Payer: Self-pay | Admitting: *Deleted

## 2016-11-14 VITALS — BP 131/77 | HR 74 | Resp 16 | Ht 73.5 in | Wt 178.0 lb

## 2016-11-14 DIAGNOSIS — R911 Solitary pulmonary nodule: Secondary | ICD-10-CM

## 2016-11-14 NOTE — Progress Notes (Signed)
[address]       [place] [address]             [phone]      Shajuana F Engram Malott Medical Record #[id number] Date of Birth: 1950/08/30  Referring: [place], Peter M, MD Primary Care: Guadalupe Dawn, MD  Chief Complaint:   POST OP FOLLOW UP 12/05/2014  OPERATIVE REPORT PREOPERATIVE DIAGNOSES: 1. Severe mitral insufficiency. 2. Aortic insufficiency. 3. Coronary occlusive disease. POSTOPERATIVE DIAGNOSES: 1. Severe mitral insufficiency. 2. Aortic insufficiency. 3. Coronary occlusive disease. 4. Aortic insufficiency, mild. PROCEDURE PERFORMED: Mitral valve replacement with [facility] pericardial tissue valve, model [id number] 29 mm, serial #[id number] and coronary artery bypass grafting x1 with the left internal mammary to the left anterior descending coronary artery. Closure of left atrial appendage. SURGEON: Lanelle Bal, MD.  History of Present Illness:     Patient returns office for follow-up evaluation of a small right upper lobe lung nodule. This nodule is been present for several years but is slowly increasing in size, from 4-5 mm to 11 mm. Follow-up scan from May until now shows fairly stable size but definitely bigger than scans done in 2016.   Patient has returned to smoking.  She denies any definite anginal chest pain but has had episodes of "reflux" that radiates to her back.  Past Medical History:  Diagnosis Date  . Anemia   . CAD (coronary artery disease)   . Carotid artery occlusion   . COPD (chronic obstructive pulmonary disease) ([place])   . GERD (gastroesophageal reflux disease)   . Headache(784.0)   . Hyperlipidemia   . Hypertension   . Leg pain   . Peripheral vascular disease ([place])   . Renal vascular disease 10/26/2014   bilateral stents placed   . Severe mitral regurgitation   . Shortness of breath dyspnea   . Tobacco abuse      History  Smoking Status  . Current Every Day Smoker  . Packs/day:  0.50  . Years: 35.00  . Types: Cigarettes  . Last attempt to quit: 10/31/2014  Smokeless Tobacco  . Never Used    Comment: has not started wellbutrin yet    History  Alcohol Use No     Allergies  Allergen Reactions  . Lisinopril Swelling    Angioedema 06/10/11  . Aspirin Other (See Comments)    Upset stomach  . Chantix [Varenicline]     insomnia  . Penicillins Hives    Current Outpatient Prescriptions  Medication Sig Dispense Refill  . albuterol (PROVENTIL HFA;VENTOLIN HFA) 108 (90 Base) MCG/ACT inhaler Inhale 2 puffs into the lungs every 6 (six) hours as needed for wheezing or shortness of breath. 1 Inhaler 0  . amLODipine (NORVASC) 10 MG tablet Take 1 tablet (10 mg total) by mouth daily. 90 tablet 3  . aspirin EC 81 MG tablet Take 1 tablet (81 mg total) by mouth daily. 90 tablet 1  . atorvastatin (LIPITOR) 80 MG tablet Take 1 tablet (80 mg total) by mouth daily. 30 tablet 2  . cyclobenzaprine (FLEXERIL) 10 MG tablet Take 1 tablet (10 mg total) by mouth 3 (three) times daily as needed. for muscle spams 70 tablet 2  . metoprolol tartrate (LOPRESSOR) 25 MG tablet Take 1 tablet (25 mg total) by mouth 2 (two) times daily. 60 tablet 2  . nicotine (NICODERM CQ - DOSED IN MG/24 HOURS) 14 mg/24hr patch Place 1 patch (14 mg total) onto the skin daily. [place]  patch 2  . nitroGLYCERIN (NITROSTAT) 0.4 MG SL tablet Place 1 tablet (0.4 mg total) under the tongue every 5 (five) minutes x 3 doses as needed for chest pain. 25 tablet 12  . traMADol (ULTRAM) 50 MG tablet Take 1-2 tablets (50-100 mg total) by mouth every 6 (six) hours as needed. for pain 30 tablet 0  . traZODone (DESYREL) 100 MG tablet Take 1 tablet (100 mg total) by mouth at bedtime as needed for sleep. 30 tablet 2   Current Facility-Administered Medications  Medication Dose Route Frequency Provider Last Rate Last Dose  . 0.9 %  sodium chloride infusion  500 mL Intravenous Continuous Armbruster, Renelda Loma, MD      . 0.9 %  sodium  chloride infusion  500 mL Intravenous Continuous Armbruster, Renelda Loma, MD           Physical Exam: BP 131/77 (BP Location: Left Arm, Patient Position: Sitting, Cuff Size: Large)   Pulse 74   Resp 16   Ht 6' 1.5" (1.867 m)   Wt 178 lb (80.7 kg)   SpO2 96% Comment: ON RA  BMI 23.17 kg/m   Physical Exam  Constitutional: No distress.  HENT:  Mouth/Throat: No oropharyngeal exudate.  Eyes: Right eye exhibits no discharge. Left eye exhibits no discharge. No scleral icterus.  Neck: No JVD present. No tracheal deviation present. No thyromegaly present.  Cardiovascular: Normal rate and regular rhythm.  Exam reveals no friction rub.   No murmur heard. Respiratory: No stridor. No respiratory distress. She has no wheezes. She has no rales. She exhibits no tenderness.  GI: She exhibits no distension. There is no tenderness. There is no rebound.  Musculoskeletal: She exhibits no edema, tenderness or deformity.  Lymphadenopathy:    She has no cervical adenopathy.  Skin: Skin is warm and dry. She is not diaphoretic.  Psychiatric: She has a normal mood and affect. Her behavior is normal. Judgment and thought content normal.    Diagnostic Studies & Laboratory data:     Recent Radiology Findings:  Study Result   CLINICAL DATA:  66 year old female with greater than 30 pack-year history of smoking, for initial lung cancer screening  EXAM: CT CHEST WITHOUT CONTRAST LOW-DOSE FOR LUNG CANCER SCREENING  TECHNIQUE: Multidetector CT imaging of the chest was performed following the standard protocol without IV contrast.  COMPARISON:  Multiple prior CTs, most recently 09/21/2015  FINDINGS: Cardiovascular: The heart is normal in size. No pericardial effusion. Prosthetic mitral valve.  Three vessel coronary atherosclerosis.  No evidence of thoracic aortic aneurysm. Atherosclerotic calcifications of the aortic arch.  Mediastinum/Nodes: No suspicious mediastinal  lymphadenopathy.  Visualized thyroid is unremarkable.  Lungs/Pleura: 9.8 mm (volumetric mean) spiculated nodule in the anterior right upper lobe (series 3/ image 65). This has significantly progressed from the prior studies.  Additional scattered small bilateral pulmonary nodules measuring up to 3.8 mm.  3.2 mm calcified granuloma in the right upper lobe.  Underlying mild centrilobular and paraseptal emphysematous changes, upper lobe predominant.  Mild biapical pleural-parenchymal scarring.  Mild linear scarring/ atelectasis in the left lower lobe. No focal consolidation.  No pleural effusion or pneumothorax.  Upper Abdomen: Visualized upper abdomen is unremarkable.  Musculoskeletal: Median sternotomy.  IMPRESSION: 9.8 mm spiculated nodule in the anterior right upper lobe. Lung-RADS Category 4A, suspicious. Follow up low-dose chest CT without contrast in 3 months (please use the following order, "CT CHEST LCS NODULE FOLLOW-UP W/O CM") is recommended. Alternatively, PET may be considered.   Electronically Signed  By: Julian Hy M.D.   On: 07/29/2016 14:24     Ct Chest Wo Contrast  09/21/2015  CLINICAL DATA:  Follow-up pulmonary nodule.  Current smoker. EXAM: CT CHEST WITHOUT CONTRAST TECHNIQUE: Multidetector CT imaging of the chest was performed following the standard protocol without IV contrast. COMPARISON:  06/08/2015 chest CT. FINDINGS: Mediastinum/Nodes: Mild cardiomegaly. No significant pericardial fluid/thickening. Left anterior, left circumflex and right coronary atherosclerosis status post CABG with left internal mammary bypass graft. Mitral valve prosthesis is in place. Atherosclerotic nonaneurysmal thoracic aorta. Normal caliber pulmonary arteries. No discrete thyroid nodules. Unremarkable esophagus. No pathologically enlarged axillary, mediastinal or gross hilar lymph nodes, noting limited sensitivity for the detection of hilar adenopathy on  this noncontrast study. Lungs/Pleura: No pneumothorax. No pleural effusion. Mild centrilobular emphysema and mild diffuse bronchial wall thickening. Apical right upper lobe 3 mm solid pulmonary nodule (series 4/ image 31) has continued to decrease in size, previously 5 mm on 06/08/2015 using similar measurement technique. Additional scattered 2-3 mm centrilobular pulmonary nodules in the upper lungs bilaterally are not appreciably changed and are considered benign. No acute consolidative airspace disease or new significant pulmonary nodules. Stable parenchymal band in the anterior left lower lobe. Upper abdomen: Partially visualized bilateral proximal renal artery stents. Musculoskeletal: No aggressive appearing focal osseous lesions. Sternotomy wires appear aligned and intact. IMPRESSION: 1. Continued reduction in the size of the apical right upper lobe pulmonary nodule, now 3 mm, consistent with resolving inflammatory nodule. 2. Mild centrilobular emphysema and mild diffuse bronchial wall thickening, suggesting COPD. No acute pulmonary disease. 3. Three-vessel coronary atherosclerosis status post CABG. Stable mild cardiomegaly. Electronically Signed   By: Ilona Sorrel M.D.   On: 09/21/2015 11:10   I have independently reviewed the above radiology studies  and reviewed the findings with the patient.   Ct Super D Chest Wo Contrast  06/08/2015  CLINICAL DATA:  Followup of right apical pulmonary nodule. Prior CABG and valve replacement. Ex-smoker. EXAM: CT CHEST WITHOUT CONTRAST TECHNIQUE: Multidetector CT imaging of the chest was performed using thin slice collimation for electromagnetic bronchoscopy planning purposes, without intravenous contrast. COMPARISON:  03/09/2015 and 10/27/2014. FINDINGS: Mediastinum/Lymph Nodes: Aortic and branch vessel atherosclerosis. Moderate cardiomegaly with mitral valve repair. Prior median sternotomy. No mediastinal or definite hilar adenopathy, given limitations of unenhanced  CT. Lungs/Pleura: No pleural fluid.  Mild centrilobular emphysema. The right apical complex nodule is less well-defined today. Solid component measures 4 mm on image 17/series 4 versus 5 mm on the prior. nodule in total measures 8 mm today versus 12 mm on the prior. On sagittal reformats today, 4 mm soft tissue component on image 43 versus 5 mm on the prior. 9 mm overall lesion size today versus 11 mm on the prior (when remeasured). Scarring at the left lung base. Upper abdomen: Normal imaged portions of the liver, spleen, stomach, pancreas, gallbladder, adrenal glands, kidneys. Bilateral renal artery stents. Musculoskeletal: No acute osseous abnormality. IMPRESSION: 1. Decreased size/definition of right apical pulmonary nodule. This suggests resolving infectious or inflammatory etiology. Recommend imaging surveillance with at least 1 more 3- 6 month follow-up. 2.  No acute process in the chest. 3. Cardiomegaly with prior median sternotomy for CABG and mitral valve repair. Electronically Signed   By: Abigail Miyamoto M.D.   On: 06/08/2015 12:37    Ct Chest Wo Contrast  03/09/2015  CLINICAL DATA:  Followup pulmonary nodule. EXAM: CT CHEST WITHOUT CONTRAST TECHNIQUE: Multidetector CT imaging of the chest was performed following the standard protocol without  IV contrast. COMPARISON:  Chest CT 10/27/2014 and 08/25/2014 FINDINGS: Mediastinum/Nodes: No breast masses, supraclavicular or axillary lymphadenopathy. The thyroid gland is grossly normal. The heart is normal in size. No pericardial effusion. Stable tortuosity, ectasia and calcifications involving the thoracic aorta. Prosthetic aortic valve is noted along with coronary artery bypass grafts. No mediastinal or hilar mass or adenopathy. The esophagus is grossly normal. Lungs/Pleura: The lungs are clear of acute process. Stable emphysematous changes. Again demonstrated is a right upper lobe pulmonary lesion which is partly solid and partly cystic. The solid component  measures slightly larger on the coronal images. It measures 7.5 mm on image number 53 of series 601. It previously measured 6 mm. The cystic component has a thicker ram than on the prior examinations. The largest measurement on the axial images of 11.5 mm and was previously 10.5 mm. Could not exclude a slow growing neoplasm. PET-CT may be helpful for further evaluation. It would not be an reasonable to continue to follow this with CT with a repeat CT scan in 4-6 months. No new regions.  No acute pulmonary findings.  No pleural effusion. Upper abdomen: No significant upper abdominal findings. Musculoskeletal: No significant bony findings. IMPRESSION: Slight interval increase in size of the right apical lung lesion as described above. Continued CT follow-up (4-6 months) or PET-CT are both reasonable. No new pulmonary lesions or acute pulmonary findings. Stable emphysematous changes. No mediastinal or hilar mass or adenopathy. Electronically Signed   By: Marijo Sanes M.D.   On: 03/09/2015 13:30       CLINICAL DATA: Re-evaluate right upper lobe pulmonary nodule, prior to possible valve surgery. Initial encounter.  EXAM: CT CHEST WITHOUT CONTRAST  TECHNIQUE: Multidetector CT imaging of the chest was performed following the standard protocol without IV contrast.  COMPARISON: CT of the chest performed 08/25/2014  FINDINGS: The small slightly spiculated nodule at the right lung apex is again seen; there is a small associated cystic component which appears to have increased mildly in size, while the solid component has decreased slightly in size, measuring approximately 5 mm. Given the slight apparent interval decrease in size of the solid component from the recent prior study, this is thought more likely to be postinfectious in nature, though malignancy cannot be entirely excluded.  No additional pulmonary nodules are seen. The lungs are otherwise clear, aside from mild scarring or  atelectasis at the left lung base. No pleural effusion or pneumothorax is seen.  Scattered coronary artery calcifications are noted. The mediastinum is otherwise unremarkable. No mediastinal lymphadenopathy is seen. No pericardial effusion is identified. The great vessels are grossly unremarkable, aside from scattered calcification. Mild calcification is noted along the thoracic aorta. The visualized portions of the thyroid gland are unremarkable. No axillary lymphadenopathy is appreciated.  No acute osseous abnormalities are identified.  IMPRESSION: 1. Small slightly spiculated nodule at the right lung apex is again seen. It has a small cystic component which appears to have increased mildly in size, while the solid component has decreased slightly in size, measuring approximately 5 mm. Given the slight apparent interval decrease in size of the solid component from the recent prior CT, this is thought more likely to be postinfectious in nature, though malignancy cannot be entirely excluded. As this remains too small to further characterize, would recommend strict follow-up CT of the chest in 3-6 months, to ensure stability. 2. Mild scarring or atelectasis at the left lung base. 3. Scattered coronary artery calcifications seen.   Electronically Signed  By: Garald Balding M.D.  On: 10/27/2014 19:40  Recent Lab Findings: Lab Results  Component Value Date   WBC 11.1 (H) 01/15/2016   HGB 14.0 01/15/2016   HCT 41.7 01/15/2016   PLT 170 01/15/2016   GLUCOSE 84 01/15/2016   CHOL 264 (H) 01/15/2016   TRIG 118 01/15/2016   HDL 47 01/15/2016   LDLDIRECT 185 (A) 09/09/2011   LDLCALC 193 (H) 01/15/2016   ALT 12 (L) 11/28/2014   AST 16 11/28/2014   NA 141 01/15/2016   K 3.7 01/15/2016   CL 107 01/15/2016   CREATININE 0.99 01/15/2016   BUN 12 01/15/2016   CO2 26 01/15/2016   TSH 1.756 11/28/2014   INR 1.8 02/27/2015   HGBA1C 5.6 01/15/2016   ECHO: 03/10/2015 Study  Conclusions  - Left ventricle: The cavity size was normal. Wall thickness was normal. Systolic function was mildly reduced. The estimated ejection fraction was in the range of 45% to 50%. There is hypokinesis of the basal-mid septal myocardium. - Aortic valve: There was moderate regurgitation. - Mitral valve: A bioprosthesis was present. - Left atrium: The atrium was mildly dilated.  Impressions:  - Basal septal hypokinesis with mildly reduced LV function; aortic valve not well visualized but probable moderate AI (2+); s/p MVR with no obvious MR; trace TR.  Transthoracic echocardiography. M-mode, complete 2D, spectral Doppler, and color Doppler. Birthdate: Patient birthdate: 02-Sep-1950. Age: Patient is 66 yr old. Sex: Gender: female. BMI: 23.9 kg/m^2. Blood pressure:   106/64 Patient status: Outpatient. Study date: Study date: 03/10/2015. Study time: 02:26 PM. Location: [place] [facility]  -------------------------------------------------------------------  ------------------------------------------------------------------- Left ventricle: The cavity size was normal. Wall thickness was normal. Systolic function was mildly reduced. The estimated ejection fraction was in the range of 45% to 50%. Regional wall motion abnormalities: There is hypokinesis of the basal-mid septal myocardium.  ------------------------------------------------------------------- Aortic valve: Poorly visualized. Doppler: Transvalvular velocity was within the normal range. There was no stenosis. There was moderate regurgitation.  ------------------------------------------------------------------- Aorta: Aortic root: The aortic root was normal in size.  ------------------------------------------------------------------- Mitral valve: A bioprosthesis was present. Doppler: There was no regurgitation.  Peak gradient (D): 8 mm  Hg.  ------------------------------------------------------------------- Left atrium: The atrium was mildly dilated.  ------------------------------------------------------------------- Right ventricle: The cavity size was normal. Systolic function was normal.  ------------------------------------------------------------------- Pulmonic valve:  Doppler: Transvalvular velocity was within the normal range. There was no evidence for stenosis.  ------------------------------------------------------------------- Tricuspid valve:  Structurally normal valve.  Doppler: Transvalvular velocity was within the normal range. There was trivial regurgitation.  ------------------------------------------------------------------- Pulmonary artery:  Systolic pressure was within the normal range.  ------------------------------------------------------------------- Right atrium: The atrium was normal in size.  ------------------------------------------------------------------- Pericardium: There was no pericardial effusion.  ------------------------------------------------------------------- Systemic veins: Inferior vena cava: The vessel was normal in size.   Assessment / Plan: In June 2016 she was noted to have a very small right upper lobe lung nodule preoperatively a repeat scan was done in August, that showed the solid component to decrease in size. On previous  CT of the chest  right upper lobe mass Decreased size/definition of right apical pulmonary nodule. This suggests resolving infectious or inflammatory etiology  A low dose screening CT was done in May of this year, suggesting that the nodule was now increasing in size , scan done today shows persistent of the right upper lobe lung lesion now larger than it was in 2016.   With the small size and deep in the upper lobe CT directed needle biopsy  may be difficult and or navigation bronchoscopy will be difficult.  Recommend to  the patient that we proceed with surgical resection, after obtaining the PET scan and pulmonary function studies updated and cardiac clearance for surgery. After the above is complete I will see her back and we will make definitive plan on timing of surgery.  She was reminded of the precautions concerning prosthetic valve and endocarditis.    [name] MD      [address] [place], [address] Office [phone]   Beeper [phone]  11/14/2016 4:04 PM

## 2016-11-17 NOTE — Progress Notes (Signed)
Cardiology Office Note    Date:  11/18/2016   ID:  Leah [name], Leah [name] May 31, 1950, MRN [id number]  PCP:  Leah Dawn, MD  Cardiologist: Dr. Martinique  Chief Complaint  Patient presents with  . Follow-up    Cardiac Clearance    History of Present Illness:    [name] is a 66 y.o. female with past medical history of CAD (s/p LIMA-LAD in 11/2014), MVR (in 11/2014 with a pericardial tissue valve), HTN, HLD, COPD, and PVD (s/p L CEA and right external iliac stenting) who presents to the office today for cardiac clearance.   She was last examined by Dr. Martinique in 02/2015 and reported doing well from a cardiac perspective at that time. Did note sternal pain which had been present since surgery but no exertional symptoms.   She has continued to be followed by Dr. Servando [name] with CT Surgery for a right upper lobe lung nodule which has increased in size. It has been recommended she proceed with surgical resection of the lesion pending cardiac clearance.   In talking with the patient today, she reports overall doing well since her last office visit. She is not overly active at baseline but is able to perform ADL's and walk to her mailbox which is approximately 200 feet without any chest discomfort. Does report dyspnea on exertion with this which has been unchanged. She has noticed an aching pain along her upper right shoulder which has been present for the past 3 weeks. This is not worse with exertion and is slightly improved with positional changes. Has been constant since onset.   She denies any recent orthopnea, PND, lower extremity edema, or claudication. She does note occasional palpitations but denies any associated lightheadedness, dizziness, or presyncope. She is still smoking 0.5 ppd but is actively trying to quit.    Past Medical History:  Diagnosis Date  . Anemia   . CAD (coronary artery disease)    a. s/p LIMA-LAD in 11/2014  . Carotid artery occlusion   . COPD  (chronic obstructive pulmonary disease) (Leah [name])   . GERD (gastroesophageal reflux disease)   . Headache(784.0)   . Hyperlipidemia   . Hypertension   . Leg pain   . Peripheral vascular disease ([place])   . Renal vascular disease 10/26/2014   bilateral stents placed   . Severe mitral regurgitation    a. s/p MVR in 11/2014 with a pericardial tissue valve  . Shortness of breath dyspnea   . Tobacco abuse     Past Surgical History:  Procedure Laterality Date  . ABDOMINAL HYSTERECTOMY    . ANGIOPLASTY / STENTING ILIAC  2010   right external iliac by Dr. Irish [name]  . CARDIAC CATHETERIZATION  11/13/11   Left Heart Cath. with Coronary Angiogram  . CARDIAC CATHETERIZATION N/A 08/24/2014   Procedure: Left Heart Cath and Coronary Angiography;  Surgeon: Leah Hampshire, MD;  Location: [facility];  Service: Cardiovascular;  Laterality: N/A;  . CARDIAC CATHETERIZATION N/A 10/26/2014   Procedure: Right/Left Heart Cath and Coronary Angiography;  Surgeon: Leah M Martinique, MD; oLAD 70%, mLAD 70% ISR, D2 30%, OFC 30%, RCA 20%, EF nl, low R heart pressures after diuresis, severe MR  . CAROTID ENDARTERECTOMY  11/21/2007   left  . CORONARY ANGIOPLASTY WITH STENT PLACEMENT  6/09   LAD 2.5x12 Promus  . CORONARY ARTERY BYPASS GRAFT N/A 12/05/2014   Procedure: CORONARY ARTERY BYPASS GRAFTING (CABG);  Surgeon: Leah Isaac, MD;  Location: [facility];  Service: Open Heart Surgery;  Laterality: N/A;  Times 1 using left internal mammary artery to LAD  . FEMORAL-POPLITEAL BYPASS GRAFT  10/12   left Dr. Kellie [name]  . MITRAL VALVE REPLACEMENT N/A 12/05/2014   Procedure: MITRAL VALVE (MV) REPLACEMENT;  Surgeon: Leah Isaac, MD;  Location: [facility];  Service: Open Heart Surgery;  Laterality: N/A;  Closure left atrial appendage  . RENAL ARTERY STENT Bilateral   . TEE WITHOUT CARDIOVERSION N/A 10/24/2014   Procedure: TRANSESOPHAGEAL ECHOCARDIOGRAM (TEE);  Surgeon: Leah Headings, MD;  Location: [facility];  Service:  Cardiovascular;  Laterality: N/A;  . TEE WITHOUT CARDIOVERSION N/A 12/05/2014   Procedure: TRANSESOPHAGEAL ECHOCARDIOGRAM (TEE);  Surgeon: Leah Isaac, MD;  Location: [facility];  Service: Open Heart Surgery;  Laterality: N/A;    Current Medications: Outpatient Medications Prior to Visit  Medication Sig Dispense Refill  . albuterol (PROVENTIL HFA;VENTOLIN HFA) 108 (90 Base) MCG/ACT inhaler Inhale 2 puffs into the lungs every 6 (six) hours as needed for wheezing or shortness of breath. 1 Inhaler 0  . amLODipine (NORVASC) 10 MG tablet Take 1 tablet (10 mg total) by mouth daily. 90 tablet 3  . aspirin EC 81 MG tablet Take 1 tablet (81 mg total) by mouth daily. 90 tablet 1  . atorvastatin (LIPITOR) 80 MG tablet Take 1 tablet (80 mg total) by mouth daily. 30 tablet 2  . cyclobenzaprine (FLEXERIL) 10 MG tablet Take 1 tablet (10 mg total) by mouth 3 (three) times daily as needed. for muscle spams 70 tablet 2  . metoprolol tartrate (LOPRESSOR) 25 MG tablet Take 1 tablet (25 mg total) by mouth 2 (two) times daily. 60 tablet 2  . nicotine (NICODERM CQ - DOSED IN MG/24 HOURS) 14 mg/24hr patch Place 1 patch (14 mg total) onto the skin daily. 28 patch 2  . nitroGLYCERIN (NITROSTAT) 0.4 MG SL tablet Place 1 tablet (0.4 mg total) under the tongue every 5 (five) minutes x 3 doses as needed for chest pain. 25 tablet 12  . traMADol (ULTRAM) 50 MG tablet Take 1-2 tablets (50-100 mg total) by mouth every 6 (six) hours as needed. for pain 30 tablet 0  . traZODone (DESYREL) 100 MG tablet Take 1 tablet (100 mg total) by mouth at bedtime as needed for sleep. 30 tablet 2   Facility-Administered Medications Prior to Visit  Medication Dose Route Frequency Provider Last Rate Last Dose  . 0.9 %  sodium chloride infusion  500 mL Intravenous Continuous Armbruster, Renelda Loma, MD      . 0.9 %  sodium chloride infusion  500 mL Intravenous Continuous Armbruster, Renelda Loma, MD         Allergies:   Lisinopril; Aspirin;  Chantix [varenicline]; and Penicillins   Social History   Social History  . Marital status: Divorced    Spouse name: N/A  . Number of children: 4  . Years of education: N/A   Occupational History  . retired    Social History Main Topics  . Smoking status: Current Every Day Smoker    Packs/day: 0.50    Years: 35.00    Types: Cigarettes    Last attempt to quit: 10/31/2014  . Smokeless tobacco: Never Used     Comment: has not started wellbutrin yet  . Alcohol use No  . Drug use: No  . Sexual activity: No   Other Topics Concern  . None   Social History Narrative  . None     Family History:  The  patient's family history includes ALS in her brother; Cancer in her mother; Heart disease in her father; Hypertension in her brother, father, and mother; Kidney disease in her father; Prostate cancer in her father; Stroke in her paternal aunt.   Review of Systems:   Please see the history of present illness.     General:  No chills, fever, night sweats or weight changes. Positive for back pain.  Cardiovascular:  No chest pain, edema, orthopnea, palpitations, paroxysmal nocturnal dyspnea. Positive for dyspnea on exertion.  Dermatological: No rash, lesions/masses Respiratory: No cough, dyspnea Urologic: No hematuria, dysuria Abdominal:   No nausea, vomiting, diarrhea, bright red blood per rectum, melena, or hematemesis Neurologic:  No visual changes, wkns, changes in mental status. All other systems reviewed and are otherwise negative except as noted above.   Physical Exam:    VS:  BP (!) 148/78   Pulse 82   Ht 6' 1.5" (1.867 m)   Wt 178 lb (80.7 kg)   BMI 23.17 kg/m    General: Well developed, well nourished [place] American female appearing in no acute distress. Head: Normocephalic, atraumatic, sclera non-icteric, no xanthomas, nares are without discharge.  Neck: No carotid bruits. JVD not elevated.  Lungs: Respirations regular and unlabored, without wheezes or rales.    Heart: Regular rate and rhythm. No S3 or S4.  No murmur, no rubs, or gallops appreciated. Abdomen: Soft, non-tender, non-distended with normoactive bowel sounds. No hepatomegaly. No rebound/guarding. No obvious abdominal masses. Msk:  Strength and tone appear normal for age. No joint deformities or effusions. Extremities: No clubbing or cyanosis. No lower extremity edema.  Distal pedal pulses are 2+ bilaterally. Neuro: Alert and oriented X 3. Moves all extremities spontaneously. No focal deficits noted. Psych:  Responds to questions appropriately with a normal affect. Skin: No rashes or lesions noted  Wt Readings from Last 3 Encounters:  11/18/16 178 lb (80.7 kg)  11/14/16 178 lb (80.7 kg)  11/01/16 178 lb (80.7 kg)     Studies/Labs Reviewed:   EKG:  EKG is ordered today.  The ekg ordered today demonstrates NSR, HR 82, with PVC's. QTc 493 ms. No acute ST or T-wave changes when compared to prior tracings.   Recent Labs: 01/15/2016: BUN 12; Creat 0.99; Hemoglobin 14.0; Platelets 170; Potassium 3.7; Sodium 141   Lipid Panel    Component Value Date/Time   CHOL 264 (H) 01/15/2016 1117   TRIG 118 01/15/2016 1117   HDL 47 01/15/2016 1117   CHOLHDL 5.6 (H) 01/15/2016 1117   VLDL 24 01/15/2016 1117   LDLCALC 193 (H) 01/15/2016 1117   LDLDIRECT 185 (A) 09/09/2011 0820    Additional studies/ records that were reviewed today include:   Echocardiogram: 02/2015 Study Conclusions  - Left ventricle: The cavity size was normal. Wall thickness was   normal. Systolic function was mildly reduced. The estimated   ejection fraction was in the range of 45% to 50%. There is   hypokinesis of the basal-mid septal myocardium. - Aortic valve: There was moderate regurgitation. - Mitral valve: A bioprosthesis was present. - Left atrium: The atrium was mildly dilated.  Impressions:  - Basal septal hypokinesis with mildly reduced LV function; aortic   valve not well visualized but probable  moderate AI (2+); s/p MVR   with no obvious MR; trace TR.   Assessment:    1. Preoperative clearance   2. Coronary artery disease involving native coronary artery of native heart without angina pectoris   3. S/P MVR (mitral  valve replacement)   4. Essential hypertension, benign   5. Hyperlipidemia LDL goal <70   6. PVD- s/p multiple proceedures   7. Tobacco use      Plan:   In order of problems listed above:  1. Preoperative Cardiac Clearance for Surgical Resection of Lung Nodule - she has been followed by Dr. Servando [name] for a right upper lobe lung nodule which has increased in size and she is planning to undergo surgical resection of the lesion. - she denies any exertional chest pain but does have baseline dyspnea on exertion along with a pain along her right upper back (has been constant since onset 3 weeks ago and most consistent with a MSK etiology).  - her EKG today shows NSR with PVC's and no acute ischemic changes.  - the procedure itself is of higher risk and she is not overly active at baseline. Therefore, will obtain a Lexiscan Myoview for ischemic evaluation. Will try to schedule for later this week as she is scheduled to follow-up with Dr. Servando [name] next week. If her stress test shows no significant reversible ischemia, no further testing will be indicated and she will be cleared to proceed.   2. CAD - s/p LIMA-LAD in 11/2014.  - she denies any recent chest pain on exertion. Has baseline dyspnea.  - plan for [facility] as above. - continue ASA, statin, and BB therapy.   3. MVR - performed in in 11/2014 with a pericardial tissue valve. Echo in 02/2015 showed a stable prosthesis with no obvious MR.  - No significant murmur appreciated on examination today. Consider repeat echocardiogram at her next office visit.   4. HTN - BP is elevated at 170/98 on initial check, at 148/78 on recheck. Has not yet taken her morning medications. - continue Amlodipine 10mg  daily and  Lopressor 25mg  BID.   5. HLD - Lipid Panel in 12/2015 showed total cholesterol of 264, HDL 47, and LDL 193. Goal LDL is < 70 with known CAD. - had been started on Atorvastatin 80mg  daily by her PCP. - will recheck FLP and LFT's today.   6. PVD -  s/p L CEA and right external iliac stenting. No bruits appreciated on examination. Carotids followed by Vascular. She denies any recent claudication symptoms. - continue ASA and statin therapy.   7. Tobacco Use - still smoking 0.5 ppd. Actively trying to quit. Congratulated on her reduction in use with full cessation advised.   Medication Adjustments/Labs and Tests Ordered: Current medicines are reviewed at length with the patient today.  Concerns regarding medicines are outlined above.  Medication changes, Labs and Tests ordered today are listed in the Patient Instructions below. Patient Instructions  Medication Instructions:  Your physician recommends that you continue on your current medications as directed. Please refer to the Current Medication list given to you today.  Labwork: Please have FASTING lab work today (Lipid, hepatic)  Testing/Procedures: Your physician has requested that you have a lexiscan myoview. For further information please visit [email or web address]. Please follow instruction sheet, as given.  Follow-Up: Your physician wants you to follow-up in: 6 MONTHS with Dr. Martinique. You will receive a reminder letter in the mail two months in advance. If you don't receive a letter, please call our office to schedule the follow-up appointment.  Any Other Special Instructions Will Be Listed Below (If Applicable).  If you need a refill on your cardiac medications before your next appointment, please call your pharmacy.   Signed, Erma Heritage, PA-C  11/18/2016 9:38 AM    [facility] [address] [place], [place]  [address] Phone: [phone]; Fax: [phone]  [address]  [place], [place] [address] Phone: [phone]

## 2016-11-18 ENCOUNTER — Encounter: Payer: Self-pay | Admitting: Student

## 2016-11-18 ENCOUNTER — Ambulatory Visit (INDEPENDENT_AMBULATORY_CARE_PROVIDER_SITE_OTHER): Payer: Medicaid Other | Admitting: Student

## 2016-11-18 VITALS — BP 148/78 | HR 82 | Ht 73.5 in | Wt 178.0 lb

## 2016-11-18 DIAGNOSIS — I10 Essential (primary) hypertension: Secondary | ICD-10-CM | POA: Diagnosis not present

## 2016-11-18 DIAGNOSIS — Z720 Tobacco use: Secondary | ICD-10-CM | POA: Diagnosis not present

## 2016-11-18 DIAGNOSIS — Z01818 Encounter for other preprocedural examination: Secondary | ICD-10-CM

## 2016-11-18 DIAGNOSIS — E785 Hyperlipidemia, unspecified: Secondary | ICD-10-CM | POA: Diagnosis not present

## 2016-11-18 DIAGNOSIS — I2510 Atherosclerotic heart disease of native coronary artery without angina pectoris: Secondary | ICD-10-CM

## 2016-11-18 DIAGNOSIS — I739 Peripheral vascular disease, unspecified: Secondary | ICD-10-CM

## 2016-11-18 DIAGNOSIS — Z952 Presence of prosthetic heart valve: Secondary | ICD-10-CM

## 2016-11-18 LAB — LIPID PANEL
Chol/HDL Ratio: 4.8 ratio — ABNORMAL HIGH (ref 0.0–4.4)
Cholesterol, Total: 246 mg/dL — ABNORMAL HIGH (ref 100–199)
HDL: 51 mg/dL (ref >39)
LDL CALC: 176 mg/dL — AB (ref 0–99)
TRIGLYCERIDES: 96 mg/dL (ref 0–149)
VLDL Cholesterol Cal: 19 mg/dL (ref 5–40)

## 2016-11-18 LAB — HEPATIC FUNCTION PANEL
ALT: 8 IU/L (ref 0–32)
AST: 11 IU/L (ref 0–40)
Albumin: 4.5 g/dL (ref 3.6–4.8)
Alkaline Phosphatase: 170 IU/L — ABNORMAL HIGH (ref 39–117)
Bilirubin Total: 0.5 mg/dL (ref 0.0–1.2)
Bilirubin, Direct: 0.11 mg/dL (ref 0.00–0.40)
Total Protein: 7.2 g/dL (ref 6.0–8.5)

## 2016-11-18 NOTE — Patient Instructions (Signed)
Medication Instructions:  Your physician recommends that you continue on your current medications as directed. Please refer to the Current Medication list given to you today.  Labwork: Please have FASTING lab work today (Lipid, hepatic)  Testing/Procedures: Your physician has requested that you have a lexiscan myoview. For further information please visit [email or web address]. Please follow instruction sheet, as given.  Follow-Up: Your physician wants you to follow-up in: 6 MONTHS with Dr. Martinique. You will receive a reminder letter in the mail two months in advance. If you don't receive a letter, please call our office to schedule the follow-up appointment.   Any Other Special Instructions Will Be Listed Below (If Applicable).     If you need a refill on your cardiac medications before your next appointment, please call your pharmacy.

## 2016-11-20 ENCOUNTER — Telehealth: Payer: Self-pay | Admitting: *Deleted

## 2016-11-20 ENCOUNTER — Ambulatory Visit (HOSPITAL_COMMUNITY)
Admission: RE | Admit: 2016-11-20 | Discharge: 2016-11-20 | Disposition: A | Discharge status: Home / Self Care | Payer: Medicaid Other | Source: Ambulatory Visit | Attending: Cardiology | Admitting: Cardiology

## 2016-11-20 DIAGNOSIS — J449 Chronic obstructive pulmonary disease, unspecified: Secondary | ICD-10-CM | POA: Diagnosis not present

## 2016-11-20 DIAGNOSIS — Z79899 Other long term (current) drug therapy: Secondary | ICD-10-CM

## 2016-11-20 DIAGNOSIS — I2510 Atherosclerotic heart disease of native coronary artery without angina pectoris: Secondary | ICD-10-CM | POA: Insufficient documentation

## 2016-11-20 DIAGNOSIS — R911 Solitary pulmonary nodule: Secondary | ICD-10-CM | POA: Insufficient documentation

## 2016-11-20 DIAGNOSIS — Z951 Presence of aortocoronary bypass graft: Secondary | ICD-10-CM | POA: Insufficient documentation

## 2016-11-20 DIAGNOSIS — N200 Calculus of kidney: Secondary | ICD-10-CM | POA: Diagnosis not present

## 2016-11-20 DIAGNOSIS — E785 Hyperlipidemia, unspecified: Secondary | ICD-10-CM

## 2016-11-20 DIAGNOSIS — I700 Atherosclerosis of aorta: Secondary | ICD-10-CM | POA: Diagnosis not present

## 2016-11-20 DIAGNOSIS — Z01818 Encounter for other preprocedural examination: Secondary | ICD-10-CM

## 2016-11-20 LAB — MYOCARDIAL PERFUSION IMAGING
CHL CUP RESTING HR STRESS: 84 {beats}/min
LVDIAVOL: 147 mL (ref 46–106)
LVSYSVOL: 101 mL
NUC STRESS TID: 1.07
Peak HR: 93 {beats}/min
SDS: 2
SRS: 7
SSS: 9

## 2016-11-20 MED ORDER — TECHNETIUM TC 99M TETROFOSMIN IV KIT
10.3000 | PACK | Freq: Once | INTRAVENOUS | Status: AC | PRN
  Administered 2016-11-20: 10.3 via INTRAVENOUS
  Filled 2016-11-20: qty 11

## 2016-11-20 MED ORDER — REGADENOSON 0.4 MG/5ML IV SOLN
0.4000 mg | Freq: Once | INTRAVENOUS | Status: AC
  Administered 2016-11-20: 0.4 mg via INTRAVENOUS

## 2016-11-20 MED ORDER — TECHNETIUM TC 99M TETROFOSMIN IV KIT
32.7000 | PACK | Freq: Once | INTRAVENOUS | Status: AC | PRN
  Administered 2016-11-20: 32.7 via INTRAVENOUS
  Filled 2016-11-20: qty 33

## 2016-11-20 NOTE — Telephone Encounter (Signed)
-----   Message from Erma Heritage, [place] sent at 11/20/2016  2:57 PM EDT ----- Thank you for reviewing this with her. Agree with taking daily and repeat lab work in 6 weeks.

## 2016-11-20 NOTE — Telephone Encounter (Signed)
Results and recommendations discussed with patient, who verbalized understanding and thanks.  

## 2016-11-21 ENCOUNTER — Ambulatory Visit (HOSPITAL_COMMUNITY)
Admission: RE | Admit: 2016-11-21 | Discharge: 2016-11-21 | Disposition: A | Discharge status: Home / Self Care | Payer: Medicaid Other | Source: Ambulatory Visit | Attending: Cardiothoracic Surgery | Admitting: Cardiothoracic Surgery

## 2016-11-21 DIAGNOSIS — R911 Solitary pulmonary nodule: Secondary | ICD-10-CM

## 2016-11-21 LAB — PULMONARY FUNCTION TEST
DL/VA % pred: 56 %
DL/VA: 3.19 ml/min/mmHg/L
DLCO unc % pred: 39 %
DLCO unc: 13.88 ml/min/mmHg
FEF 25-75 Post: 1.38 L/sec
FEF 25-75 Pre: 1.84 L/sec
FEF2575-%Change-Post: -24 %
FEF2575-%Pred-Post: 54 %
FEF2575-%Pred-Pre: 72 %
FEV1-%Change-Post: -2 %
FEV1-%Pred-Post: 74 %
FEV1-%Pred-Pre: 76 %
FEV1-Post: 2.06 L
FEV1-Pre: 2.11 L
FEV1FVC-%Change-Post: -1 %
FEV1FVC-%Pred-Pre: 95 %
FEV6-%Change-Post: 0 %
FEV6-%Pred-Post: 80 %
FEV6-%Pred-Pre: 79 %
FEV6-Post: 2.74 L
FEV6-Pre: 2.73 L
FEV6FVC-%Change-Post: 0 %
FEV6FVC-%Pred-Post: 102 %
FEV6FVC-%Pred-Pre: 103 %
FVC-%Change-Post: 0 %
FVC-%Pred-Post: 78 %
FVC-%Pred-Pre: 79 %
FVC-Post: 2.75 L
FVC-Pre: 2.78 L
Post FEV1/FVC ratio: 75 %
Post FEV6/FVC ratio: 99 %
Pre FEV1/FVC ratio: 76 %
Pre FEV6/FVC Ratio: 100 %
RV % pred: 90 %
RV: 2.23 L
TLC % pred: 81 %
TLC: 5.12 L

## 2016-11-21 LAB — GLUCOSE, CAPILLARY: Glucose-Capillary: 113 mg/dL — ABNORMAL HIGH (ref 65–99)

## 2016-11-21 MED ORDER — ALBUTEROL SULFATE (2.5 MG/3ML) 0.083% IN NEBU
2.5000 mg | INHALATION_SOLUTION | Freq: Once | RESPIRATORY_TRACT | Status: AC
  Administered 2016-11-21: 2.5 mg via RESPIRATORY_TRACT

## 2016-11-21 MED ORDER — FLUDEOXYGLUCOSE F - 18 (FDG) INJECTION
10.0000 | Freq: Once | INTRAVENOUS | Status: AC | PRN
  Administered 2016-11-21: 10 via INTRAVENOUS

## 2016-11-27 ENCOUNTER — Ambulatory Visit (INDEPENDENT_AMBULATORY_CARE_PROVIDER_SITE_OTHER): Payer: Medicaid Other | Admitting: Cardiothoracic Surgery

## 2016-11-27 VITALS — BP 122/80 | HR 70 | Resp 20 | Ht 73.0 in | Wt 177.0 lb

## 2016-11-27 DIAGNOSIS — R911 Solitary pulmonary nodule: Secondary | ICD-10-CM

## 2016-11-27 NOTE — Progress Notes (Signed)
6 Minute Walk Test Results  Patient: Leah [name] [facility] Date:  11/27/2016   Supplemental O2 during test? none      Baseline   End   Time   1658    1703 Heartrate  70    87 Dyspnea  no    no Fatigue  no    Left leg pain O2 sat   98%    99% Blood pressure 122/80    128/87   Patient ambulated at a fast pace for a total distance of 540 feet with 1 stop.  Ambulation was limited primarily due to left leg and hip pain.  Overall the test was tolerated well

## 2016-11-27 NOTE — Progress Notes (Signed)
[address]       [place] [address]             [phone]      Leah [name] [place] Medical Record #[id number] Date of Birth: 1950-05-23  Referring: [place], Peter M, MD Primary Care: Guadalupe Dawn, MD  Chief Complaint:   POST OP FOLLOW UP 12/05/2014  OPERATIVE REPORT PREOPERATIVE DIAGNOSES: 1. Severe mitral insufficiency. 2. Aortic insufficiency. 3. Coronary occlusive disease. POSTOPERATIVE DIAGNOSES: 1. Severe mitral insufficiency. 2. Aortic insufficiency. 3. Coronary occlusive disease. 4. Aortic insufficiency, mild. PROCEDURE PERFORMED: Mitral valve replacement with [facility] pericardial tissue valve, model 7300TFX 29 mm, serial #[id number] and coronary artery bypass grafting x1 with the left internal mammary to the left anterior descending coronary artery. Closure of left atrial appendage. SURGEON: Lanelle Bal, MD.  History of Present Illness:     Patient returns office for follow-up evaluation of a small right upper lobe lung nodule. This nodule is been present for several years but is slowly increasing in size, from 4-5 mm to 11 mm. Follow-up scan from May until now shows fairly stable size but definitely bigger than scans done in 2016. Patient returns today after cardiac clearance , PET scan and PFT's  Patient notes she has no longer smoking. She has become more physically active but is limited by pain in especially her left leg and hip with ambulation. She denies chest pain or angina.    Past Medical History:  Diagnosis Date  . Anemia   . CAD (coronary artery disease)    a. s/p LIMA-LAD in 11/2014  . Carotid artery occlusion   . COPD (chronic obstructive pulmonary disease) ([place])   . GERD (gastroesophageal reflux disease)   . Headache(784.0)   . Hyperlipidemia   . Hypertension   . Leg pain   . Peripheral vascular disease ([place])   . Renal vascular disease 10/26/2014   bilateral stents placed   . Severe  mitral regurgitation    a. s/p MVR in 11/2014 with a pericardial tissue valve  . Shortness of breath dyspnea   . Tobacco abuse      History  Smoking Status  . Current Every Day Smoker  . Packs/day: 0.50  . Years: 35.00  . Types: Cigarettes  . Last attempt to quit: 10/31/2014  Smokeless Tobacco  . Never Used    Comment: has not started wellbutrin yet    History  Alcohol Use No     Allergies  Allergen Reactions  . Lisinopril Swelling    Angioedema 06/10/11  . Aspirin Other (See Comments)    Upset stomach  . Chantix [Varenicline]     insomnia  . Penicillins Hives    Current Outpatient Prescriptions  Medication Sig Dispense Refill  . albuterol (PROVENTIL HFA;VENTOLIN HFA) 108 (90 Base) MCG/ACT inhaler Inhale 2 puffs into the lungs every 6 (six) hours as needed for wheezing or shortness of breath. 1 Inhaler 0  . amLODipine (NORVASC) 10 MG tablet Take 1 tablet (10 mg total) by mouth daily. 90 tablet 3  . aspirin EC 81 MG tablet Take 1 tablet (81 mg total) by mouth daily. 90 tablet 1  . atorvastatin (LIPITOR) 80 MG tablet Take 1 tablet (80 mg total) by mouth daily. 30 tablet 2  . cyclobenzaprine (FLEXERIL) 10 MG tablet Take 1 tablet (10 mg total) by mouth 3 (three) times daily as needed. for muscle spams 70 tablet 2  . metoprolol tartrate (LOPRESSOR) 25 MG  tablet Take 1 tablet (25 mg total) by mouth 2 (two) times daily. 60 tablet 2  . nicotine (NICODERM CQ - DOSED IN MG/24 HOURS) 14 mg/24hr patch Place 1 patch (14 mg total) onto the skin daily. 28 patch 2  . nitroGLYCERIN (NITROSTAT) 0.4 MG SL tablet Place 1 tablet (0.4 mg total) under the tongue every 5 (five) minutes x 3 doses as needed for chest pain. 25 tablet 12  . traMADol (ULTRAM) 50 MG tablet Take 1-2 tablets (50-100 mg total) by mouth every 6 (six) hours as needed. for pain 30 tablet 0  . traZODone (DESYREL) 100 MG tablet Take 1 tablet (100 mg total) by mouth at bedtime as needed for sleep. 30 tablet 2   Current  Facility-Administered Medications  Medication Dose Route Frequency Provider Last Rate Last Dose  . 0.9 %  sodium chloride infusion  500 mL Intravenous Continuous Armbruster, Carlota Raspberry, MD      . 0.9 %  sodium chloride infusion  500 mL Intravenous Continuous Armbruster, Carlota Raspberry, MD           Physical Exam: BP 122/80   Pulse 70   Resp 20   Ht 6\' 1"  (1.854 m)   Wt 177 lb (80.3 kg)   SpO2 98% Comment: RA  BMI 23.35 kg/m   Physical Exam  Vitals reviewed. Constitutional: She is oriented to person, place, and time.  HENT:  Head: Normocephalic and atraumatic.  Mouth/Throat: No oropharyngeal exudate.  Neck: No JVD present. No tracheal deviation present. No thyromegaly present.  Cardiovascular: Normal rate, regular rhythm and intact distal pulses.  Exam reveals no gallop and no friction rub.   No murmur heard. Respiratory: No stridor. No respiratory distress. She has no wheezes. She has no rales. She exhibits no tenderness.  GI: She exhibits no distension. There is no tenderness. There is no rebound.  Musculoskeletal: She exhibits no edema, tenderness or deformity.  Lymphadenopathy:    She has no cervical adenopathy.  Neurological: She is alert and oriented to person, place, and time. No cranial nerve deficit.  Skin: Skin is warm and dry. She is not diaphoretic.  Psychiatric: She has a normal mood and affect. Her behavior is normal. Judgment and thought content normal.  chest incision well healed   Diagnostic Studies & Laboratory data:     Recent Radiology Findings:  Study Result   CLINICAL DATA:  66 year old female with greater than 30 pack-year history of smoking, for initial lung cancer screening  EXAM: CT CHEST WITHOUT CONTRAST LOW-DOSE FOR LUNG CANCER SCREENING  TECHNIQUE: Multidetector CT imaging of the chest was performed following the standard protocol without IV contrast.  COMPARISON:  Multiple prior CTs, most recently 09/21/2015  FINDINGS: Cardiovascular:  The heart is normal in size. No pericardial effusion. Prosthetic mitral valve.  Three vessel coronary atherosclerosis.  No evidence of thoracic aortic aneurysm. Atherosclerotic calcifications of the aortic arch.  Mediastinum/Nodes: No suspicious mediastinal lymphadenopathy.  Visualized thyroid is unremarkable.  Lungs/Pleura: 9.8 mm (volumetric mean) spiculated nodule in the anterior right upper lobe (series 3/ image 65). This has significantly progressed from the prior studies.  Additional scattered small bilateral pulmonary nodules measuring up to 3.8 mm.  3.2 mm calcified granuloma in the right upper lobe.  Underlying mild centrilobular and paraseptal emphysematous changes, upper lobe predominant.  Mild biapical pleural-parenchymal scarring.  Mild linear scarring/ atelectasis in the left lower lobe. No focal consolidation.  No pleural effusion or pneumothorax.  Upper Abdomen: Visualized upper abdomen is unremarkable.  Musculoskeletal: Median sternotomy.  IMPRESSION: 9.8 mm spiculated nodule in the anterior right upper lobe. Lung-RADS Category 4A, suspicious. Follow up low-dose chest CT without contrast in 3 months (please use the following order, "CT CHEST LCS NODULE FOLLOW-UP W/O CM") is recommended. Alternatively, PET may be considered.   Electronically Signed   By: Julian Hy M.D.   On: 07/29/2016 14:24     Ct Chest Wo Contrast  09/21/2015  CLINICAL DATA:  Follow-up pulmonary nodule.  Current smoker. EXAM: CT CHEST WITHOUT CONTRAST TECHNIQUE: Multidetector CT imaging of the chest was performed following the standard protocol without IV contrast. COMPARISON:  06/08/2015 chest CT. FINDINGS: Mediastinum/Nodes: Mild cardiomegaly. No significant pericardial fluid/thickening. Left anterior, left circumflex and right coronary atherosclerosis status post CABG with left internal mammary bypass graft. Mitral valve prosthesis is in place.  Atherosclerotic nonaneurysmal thoracic aorta. Normal caliber pulmonary arteries. No discrete thyroid nodules. Unremarkable esophagus. No pathologically enlarged axillary, mediastinal or gross hilar lymph nodes, noting limited sensitivity for the detection of hilar adenopathy on this noncontrast study. Lungs/Pleura: No pneumothorax. No pleural effusion. Mild centrilobular emphysema and mild diffuse bronchial wall thickening. Apical right upper lobe 3 mm solid pulmonary nodule (series 4/ image 31) has continued to decrease in size, previously 5 mm on 06/08/2015 using similar measurement technique. Additional scattered 2-3 mm centrilobular pulmonary nodules in the upper lungs bilaterally are not appreciably changed and are considered benign. No acute consolidative airspace disease or new significant pulmonary nodules. Stable parenchymal band in the anterior left lower lobe. Upper abdomen: Partially visualized bilateral proximal renal artery stents. Musculoskeletal: No aggressive appearing focal osseous lesions. Sternotomy wires appear aligned and intact. IMPRESSION: 1. Continued reduction in the size of the apical right upper lobe pulmonary nodule, now 3 mm, consistent with resolving inflammatory nodule. 2. Mild centrilobular emphysema and mild diffuse bronchial wall thickening, suggesting COPD. No acute pulmonary disease. 3. Three-vessel coronary atherosclerosis status post CABG. Stable mild cardiomegaly. Electronically Signed   By: Ilona Sorrel M.D.   On: 09/21/2015 11:10   I have independently reviewed the above radiology studies  and reviewed the findings with the patient.   Ct Super D Chest Wo Contrast  06/08/2015  CLINICAL DATA:  Followup of right apical pulmonary nodule. Prior CABG and valve replacement. Ex-smoker. EXAM: CT CHEST WITHOUT CONTRAST TECHNIQUE: Multidetector CT imaging of the chest was performed using thin slice collimation for electromagnetic bronchoscopy planning purposes, without  intravenous contrast. COMPARISON:  03/09/2015 and 10/27/2014. FINDINGS: Mediastinum/Lymph Nodes: Aortic and branch vessel atherosclerosis. Moderate cardiomegaly with mitral valve repair. Prior median sternotomy. No mediastinal or definite hilar adenopathy, given limitations of unenhanced CT. Lungs/Pleura: No pleural fluid.  Mild centrilobular emphysema. The right apical complex nodule is less well-defined today. Solid component measures 4 mm on image 17/series 4 versus 5 mm on the prior. nodule in total measures 8 mm today versus 12 mm on the prior. On sagittal reformats today, 4 mm soft tissue component on image 43 versus 5 mm on the prior. 9 mm overall lesion size today versus 11 mm on the prior (when remeasured). Scarring at the left lung base. Upper abdomen: Normal imaged portions of the liver, spleen, stomach, pancreas, gallbladder, adrenal glands, kidneys. Bilateral renal artery stents. Musculoskeletal: No acute osseous abnormality. IMPRESSION: 1. Decreased size/definition of right apical pulmonary nodule. This suggests resolving infectious or inflammatory etiology. Recommend imaging surveillance with at least 1 more 3- 6 month follow-up. 2.  No acute process in the chest. 3. Cardiomegaly with prior median sternotomy for  CABG and mitral valve repair. Electronically Signed   By: Abigail Miyamoto M.D.   On: 06/08/2015 12:37    Ct Chest Wo Contrast  03/09/2015  CLINICAL DATA:  Followup pulmonary nodule. EXAM: CT CHEST WITHOUT CONTRAST TECHNIQUE: Multidetector CT imaging of the chest was performed following the standard protocol without IV contrast. COMPARISON:  Chest CT 10/27/2014 and 08/25/2014 FINDINGS: Mediastinum/Nodes: No breast masses, supraclavicular or axillary lymphadenopathy. The thyroid gland is grossly normal. The heart is normal in size. No pericardial effusion. Stable tortuosity, ectasia and calcifications involving the thoracic aorta. Prosthetic aortic valve is noted along with coronary artery  bypass grafts. No mediastinal or hilar mass or adenopathy. The esophagus is grossly normal. Lungs/Pleura: The lungs are clear of acute process. Stable emphysematous changes. Again demonstrated is a right upper lobe pulmonary lesion which is partly solid and partly cystic. The solid component measures slightly larger on the coronal images. It measures 7.5 mm on image number 53 of series 601. It previously measured 6 mm. The cystic component has a thicker ram than on the prior examinations. The largest measurement on the axial images of 11.5 mm and was previously 10.5 mm. Could not exclude a slow growing neoplasm. PET-CT may be helpful for further evaluation. It would not be an reasonable to continue to follow this with CT with a repeat CT scan in 4-6 months. No new regions.  No acute pulmonary findings.  No pleural effusion. Upper abdomen: No significant upper abdominal findings. Musculoskeletal: No significant bony findings. IMPRESSION: Slight interval increase in size of the right apical lung lesion as described above. Continued CT follow-up (4-6 months) or PET-CT are both reasonable. No new pulmonary lesions or acute pulmonary findings. Stable emphysematous changes. No mediastinal or hilar mass or adenopathy. Electronically Signed   By: Marijo Sanes M.D.   On: 03/09/2015 13:30       CLINICAL DATA: Re-evaluate right upper lobe pulmonary nodule, prior to possible valve surgery. Initial encounter.  EXAM: CT CHEST WITHOUT CONTRAST  TECHNIQUE: Multidetector CT imaging of the chest was performed following the standard protocol without IV contrast.  COMPARISON: CT of the chest performed 08/25/2014  FINDINGS: The small slightly spiculated nodule at the right lung apex is again seen; there is a small associated cystic component which appears to have increased mildly in size, while the solid component has decreased slightly in size, measuring approximately 5 mm. Given the slight apparent  interval decrease in size of the solid component from the recent prior study, this is thought more likely to be postinfectious in nature, though malignancy cannot be entirely excluded.  No additional pulmonary nodules are seen. The lungs are otherwise clear, aside from mild scarring or atelectasis at the left lung base. No pleural effusion or pneumothorax is seen.  Scattered coronary artery calcifications are noted. The mediastinum is otherwise unremarkable. No mediastinal lymphadenopathy is seen. No pericardial effusion is identified. The great vessels are grossly unremarkable, aside from scattered calcification. Mild calcification is noted along the thoracic aorta. The visualized portions of the thyroid gland are unremarkable. No axillary lymphadenopathy is appreciated.  No acute osseous abnormalities are identified.  IMPRESSION: 1. Small slightly spiculated nodule at the right lung apex is again seen. It has a small cystic component which appears to have increased mildly in size, while the solid component has decreased slightly in size, measuring approximately 5 mm. Given the slight apparent interval decrease in size of the solid component from the recent prior CT, this is thought more likely  to be postinfectious in nature, though malignancy cannot be entirely excluded. As this remains too small to further characterize, would recommend strict follow-up CT of the chest in 3-6 months, to ensure stability. 2. Mild scarring or atelectasis at the left lung base. 3. Scattered coronary artery calcifications seen.   Electronically Signed  By: Garald Balding M.D.  On: 10/27/2014 19:40  Study Result   CLINICAL DATA:  Initial treatment strategy for right upper lobe lung nodule.  EXAM: NUCLEAR MEDICINE PET SKULL BASE TO THIGH  TECHNIQUE: 10.0 mCi F-18 FDG was injected intravenously. Full-ring PET imaging was performed from the skull base to thigh after the radiotracer.  CT data was obtained and used for attenuation correction and anatomic localization.  FASTING BLOOD GLUCOSE:  Value: 113 mg/dl  COMPARISON:  Multiple exams, including chest CT from 11/12/2016  FINDINGS: NECK  No hypermetabolic lymph nodes in the neck. Atherosclerotic calcification of the carotid arteries. Symmetric glottic activity is thought to likely be physiologic.  CHEST  1.0 by 0.7 cm right upper lobe pulmonary nodule on image 18 series 7, maximum SUV 3.4, suspicious for malignancy.  A small right axillary lymph node with fatty hilum, total nodes size 10 mm but with the fatty hilum making up a large portion of this measurement has a maximum SUV of 2.1 and is likely benign. Background mediastinal blood pool activity 2.9.  No hypermetabolic mediastinal or hilar adenopathy.  Coronary, aortic arch, and branch vessel atherosclerotic vascular disease. Mild cardiomegaly.  ABDOMEN/PELVIS  No abnormal hypermetabolic activity within the liver, pancreas, adrenal glands, or spleen. No hypermetabolic lymph nodes in the abdomen or pelvis.  Aortoiliac atherosclerotic vascular disease. Abdominal aortic ectasia at 2.9 cm diameter.  Dependent density in the gallbladder potentially from sludge or gallstones. Suspected 2 mm right kidney lower pole nonobstructive renal calculus. Suspected pelvic floor laxity.  SKELETON  No focal hypermetabolic activity to suggest skeletal metastasis.  IMPRESSION: 1. Hypermetabolic 1.0 cm right upper lobe pulmonary nodule suspicious for malignancy. No hypermetabolic adenopathy or metastatic disease. Assuming non-small cell lung cancer the appearance is most compatible with T1a N0 M0 disease (stage 1A). 2. Aortic Atherosclerosis (ICD10-I70.0). Coronary atherosclerosis with mild cardiomegaly. 3. Mild nonobstructive right nephrolithiasis. 4. Sludge or possibly gallstones in the gallbladder.   Electronically Signed   By: Van Clines M.D.   On: 11/21/2016 14:26       Recent Lab Findings: Lab Results  Component Value Date   WBC 11.1 (H) 01/15/2016   HGB 14.0 01/15/2016   HCT 41.7 01/15/2016   PLT 170 01/15/2016   GLUCOSE 84 01/15/2016   CHOL 246 (H) 11/18/2016   TRIG 96 11/18/2016   HDL 51 11/18/2016   LDLDIRECT 185 (A) 09/09/2011   LDLCALC 176 (H) 11/18/2016   ALT 8 11/18/2016   AST 11 11/18/2016   NA 141 01/15/2016   K 3.7 01/15/2016   CL 107 01/15/2016   CREATININE 0.99 01/15/2016   BUN 12 01/15/2016   CO2 26 01/15/2016   TSH 1.756 11/28/2014   INR 1.8 02/27/2015   HGBA1C 5.6 01/15/2016   Notes recorded by Erma Heritage, PA-C on 11/20/2016 at 5:32 PM EDT Please let the patient know her stress test showed evidence of her prior infarction but no ischemia that would be concerning for a potential blockage. Her EF was reduced, therefore would recommend a limited echo to reassess EF as this can sometimes be read as falsely low on a stress test (EF was 45-50% by echo in 2016). She  does not need to have the echocardiogram performed prior to surgery as this can be scheduled at a later date. Is cleared from a cardiac perspective to proceed with surgery.      Vitals   Height Weight BMI (Calculated)  6\' 1"  (1.854 m) 177 lb (80.3 kg) 23.36  Study Highlights    Nuclear stress EF: 31%. The left ventricular ejection fraction is moderately decreased (30-44%).  Defect 1: There is a medium defect of moderate severity present in the apical anterior, apical septal, apical inferior and apex location.  Findings consistent with prior apical myocardial infarction.  This is a high risk study based on severely reduced LV function . There is no evidence of ischemia     PFT"S fev1 2.11 76%  dlco 13.88  39%   Interpretation: The FVC, FEV1 and FEV1/FVC ratio are reduced, but the FEF25-75% is within normal limits. The airway resistance is normal. Lung volumes are within normal limits. Following  administration of bronchodilators, there is no significant response. The reduced diffusing capacity indicates a severe loss of functional alveolar capillary surface. However, the diffusing capacity was not corrected for the patient's hemoglobin. Conclusions: Although there is airway obstruction and a diffusion defect suggesting emphysema, the absence of overinflation is inconsistent with that diagnosis. In view of the severity of the diffusion defect, studies with exercise would be helpful to evaluate the presence of hypoxemia. Pulmonary Function Diagnosis: Mild Obstructive Airways Disease Severe Diffusion Defect    6 min walk test  6 Minute Walk Test Results  Patient:            [name] Date:                11/27/2016   Supplemental O2 during test?            none                                       Baseline                                 End   Time                            1658                                        1703 Heartrate                     70                                            87 Dyspnea                      no                                            no Fatigue  no                                            Left leg pain O2 sat                          98%                                         99% Blood pressure            122/80                                     128/87   Patient ambulated at a fast pace for a total distance of 540 feet with 1 stop.  Ambulation was limited primarily due to left leg and hip pain.  Overall the test was tolerated well   ECHO: 03/10/2015 Study Conclusions  - Left ventricle: The cavity size was normal. Wall thickness was normal. Systolic function was mildly reduced. The estimated ejection fraction was in the range of 45% to 50%. There is hypokinesis of the basal-mid septal myocardium. - Aortic valve: There was moderate regurgitation. - Mitral valve: A  bioprosthesis was present. - Left atrium: The atrium was mildly dilated.  Impressions:  - Basal septal hypokinesis with mildly reduced LV function; aortic valve not well visualized but probable moderate AI (2+); s/p MVR with no obvious MR; trace TR.  Transthoracic echocardiography. M-mode, complete 2D, spectral Doppler, and color Doppler. Birthdate: Patient birthdate: 1951/03/07. Age: Patient is 66 yr old. Sex: Gender: female. BMI: 23.9 kg/m^2. Blood pressure:   106/64 Patient status: Outpatient. Study date: Study date: 03/10/2015. Study time: 02:26 PM. Location: [facility]  -------------------------------------------------------------------  ------------------------------------------------------------------- Left ventricle: The cavity size was normal. Wall thickness was normal. Systolic function was mildly reduced. The estimated ejection fraction was in the range of 45% to 50%. Regional wall motion abnormalities: There is hypokinesis of the basal-mid septal myocardium.  ------------------------------------------------------------------- Aortic valve: Poorly visualized. Doppler: Transvalvular velocity was within the normal range. There was no stenosis. There was moderate regurgitation.  ------------------------------------------------------------------- Aorta: Aortic root: The aortic root was normal in size.  ------------------------------------------------------------------- Mitral valve: A bioprosthesis was present. Doppler: There was no regurgitation.  Peak gradient (D): 8 mm Hg.  ------------------------------------------------------------------- Left atrium: The atrium was mildly dilated.  ------------------------------------------------------------------- Right ventricle: The cavity size was normal. Systolic function was normal.  ------------------------------------------------------------------- Pulmonic valve:  Doppler:  Transvalvular velocity was within the normal range. There was no evidence for stenosis.  ------------------------------------------------------------------- Tricuspid valve:  Structurally normal valve.  Doppler: Transvalvular velocity was within the normal range. There was trivial regurgitation.  ------------------------------------------------------------------- Pulmonary artery:  Systolic pressure was within the normal range.  ------------------------------------------------------------------- Right atrium: The atrium was normal in size.  ------------------------------------------------------------------- Pericardium: There was no pericardial effusion.  ------------------------------------------------------------------- Systemic veins: Inferior vena cava: The vessel was normal in size.   Assessment / Plan: In June 2016 she was noted to have a very small right upper lobe lung nodule preoperatively a repeat scan was done in August, that showed the solid component to decrease in size. On previous  CT of the chest  right upper lobe mass Decreased size/definition of right apical pulmonary nodule. This suggests resolving infectious or inflammatory etiology  A low dose screening CT was done in May of this year, suggesting that the nodule was now increasing in size , follow up scan scan  shows persistent of the right upper lobe lung lesion now larger than it was in 2016.   With the small size and deep in the upper lobe CT directed needle biopsy may be difficult and or navigation bronchoscopy will be difficult.  Recommend to the patient that we proceed with surgical resection, a preop  PET scan ,pulmonary function studies updated and cardiac clearance for surgery and 6 min walk test have been done . Risks and options of surgical resection were discussed with the patient in detail She is agreeable with proceeding with plan later this week or bronchoscopy right video-assisted  thoracoscopy with lung resection and lymph node dissection if appropriate. The goals risks and alternatives of the planned surgical procedure   have been discussed with the patient in detail. The risks of the procedure including death, infection, stroke, persistent air leak  myocardial infarction, bleeding, blood transfusion have all been discussed specifically.  I have quoted Leah [name] a 2 % of perioperative mortality and a complication rate as high as 30%. The patient's questions have been answered.Leah [name] is willing  to proceed with the planned procedure.  She was reminded of the precautions concerning prosthetic valve and endocarditis.    Leah Isaac MD      [address] [place] [address] Office [phone]   [place]  11/27/2016 4:44 PM

## 2016-11-28 ENCOUNTER — Other Ambulatory Visit: Payer: Self-pay | Admitting: *Deleted

## 2016-11-28 ENCOUNTER — Encounter (HOSPITAL_COMMUNITY): Payer: Self-pay | Admitting: *Deleted

## 2016-11-28 ENCOUNTER — Telehealth: Payer: Self-pay | Admitting: *Deleted

## 2016-11-28 ENCOUNTER — Encounter: Payer: Self-pay | Admitting: Cardiothoracic Surgery

## 2016-11-28 DIAGNOSIS — R911 Solitary pulmonary nodule: Secondary | ICD-10-CM

## 2016-11-28 NOTE — Telephone Encounter (Signed)
Patient is scheduled for lung surgery on Friday 9/7 with Dr. Servando Snare.  Pt will arrive at [facility] at 7am with nothing to eat or drink after midnight.  She understands and has no further questions.    Per Dr. Servando Snare, I did reach out to Dr. Martinique to clarify if patient is cleared for surgery and he has given the green light and said we can proceed.  Dr. Servando Snare made aware.

## 2016-11-29 ENCOUNTER — Inpatient Hospital Stay (HOSPITAL_COMMUNITY): Payer: Medicaid Other | Admitting: Certified Registered Nurse Anesthetist

## 2016-11-29 ENCOUNTER — Encounter (HOSPITAL_COMMUNITY): Payer: Self-pay | Admitting: Urology

## 2016-11-29 ENCOUNTER — Encounter (HOSPITAL_COMMUNITY)
Admission: RE | Disposition: A | Discharge status: Home / Self Care | Payer: Self-pay | Source: Ambulatory Visit | Attending: Cardiothoracic Surgery

## 2016-11-29 ENCOUNTER — Inpatient Hospital Stay (HOSPITAL_COMMUNITY): Payer: Medicaid Other

## 2016-11-29 ENCOUNTER — Inpatient Hospital Stay (HOSPITAL_COMMUNITY)
Admission: RE | Admit: 2016-11-29 | Discharge: 2016-12-03 | DRG: 165 | Disposition: A | Discharge status: Home / Self Care | Payer: Medicaid Other | Source: Ambulatory Visit | Attending: Cardiothoracic Surgery | Admitting: Cardiothoracic Surgery

## 2016-11-29 DIAGNOSIS — R911 Solitary pulmonary nodule: Secondary | ICD-10-CM

## 2016-11-29 DIAGNOSIS — I10 Essential (primary) hypertension: Secondary | ICD-10-CM | POA: Diagnosis present

## 2016-11-29 DIAGNOSIS — I739 Peripheral vascular disease, unspecified: Secondary | ICD-10-CM | POA: Diagnosis present

## 2016-11-29 DIAGNOSIS — C3491 Malignant neoplasm of unspecified part of right bronchus or lung: Secondary | ICD-10-CM

## 2016-11-29 DIAGNOSIS — Z886 Allergy status to analgesic agent status: Secondary | ICD-10-CM

## 2016-11-29 DIAGNOSIS — Z87442 Personal history of urinary calculi: Secondary | ICD-10-CM | POA: Diagnosis not present

## 2016-11-29 DIAGNOSIS — I351 Nonrheumatic aortic (valve) insufficiency: Secondary | ICD-10-CM | POA: Diagnosis present

## 2016-11-29 DIAGNOSIS — I700 Atherosclerosis of aorta: Secondary | ICD-10-CM | POA: Diagnosis present

## 2016-11-29 DIAGNOSIS — Z9689 Presence of other specified functional implants: Secondary | ICD-10-CM

## 2016-11-29 DIAGNOSIS — C3411 Malignant neoplasm of upper lobe, right bronchus or lung: Principal | ICD-10-CM | POA: Diagnosis present

## 2016-11-29 DIAGNOSIS — K219 Gastro-esophageal reflux disease without esophagitis: Secondary | ICD-10-CM | POA: Diagnosis present

## 2016-11-29 DIAGNOSIS — I2510 Atherosclerotic heart disease of native coronary artery without angina pectoris: Secondary | ICD-10-CM | POA: Diagnosis present

## 2016-11-29 DIAGNOSIS — Z880 Allergy status to penicillin: Secondary | ICD-10-CM | POA: Diagnosis not present

## 2016-11-29 DIAGNOSIS — Z7982 Long term (current) use of aspirin: Secondary | ICD-10-CM | POA: Diagnosis not present

## 2016-11-29 DIAGNOSIS — Z955 Presence of coronary angioplasty implant and graft: Secondary | ICD-10-CM | POA: Diagnosis not present

## 2016-11-29 DIAGNOSIS — E785 Hyperlipidemia, unspecified: Secondary | ICD-10-CM | POA: Diagnosis present

## 2016-11-29 DIAGNOSIS — Z952 Presence of prosthetic heart valve: Secondary | ICD-10-CM

## 2016-11-29 DIAGNOSIS — J9811 Atelectasis: Secondary | ICD-10-CM

## 2016-11-29 DIAGNOSIS — Z888 Allergy status to other drugs, medicaments and biological substances status: Secondary | ICD-10-CM | POA: Diagnosis not present

## 2016-11-29 DIAGNOSIS — Z951 Presence of aortocoronary bypass graft: Secondary | ICD-10-CM | POA: Diagnosis not present

## 2016-11-29 DIAGNOSIS — J449 Chronic obstructive pulmonary disease, unspecified: Secondary | ICD-10-CM | POA: Diagnosis present

## 2016-11-29 DIAGNOSIS — E876 Hypokalemia: Secondary | ICD-10-CM | POA: Diagnosis not present

## 2016-11-29 DIAGNOSIS — Z87891 Personal history of nicotine dependence: Secondary | ICD-10-CM | POA: Diagnosis not present

## 2016-11-29 DIAGNOSIS — N200 Calculus of kidney: Secondary | ICD-10-CM | POA: Diagnosis present

## 2016-11-29 DIAGNOSIS — Z09 Encounter for follow-up examination after completed treatment for conditions other than malignant neoplasm: Secondary | ICD-10-CM

## 2016-11-29 DIAGNOSIS — Z902 Acquired absence of lung [part of]: Secondary | ICD-10-CM

## 2016-11-29 HISTORY — DX: Malignant neoplasm of unspecified part of right bronchus or lung: C34.91

## 2016-11-29 HISTORY — PX: VIDEO ASSISTED THORACOSCOPY (VATS)/WEDGE RESECTION: SHX6174

## 2016-11-29 HISTORY — DX: Other complications of anesthesia, initial encounter: T88.59XA

## 2016-11-29 HISTORY — DX: Adverse effect of unspecified anesthetic, initial encounter: T41.45XA

## 2016-11-29 HISTORY — DX: Personal history of urinary calculi: Z87.442

## 2016-11-29 HISTORY — PX: VIDEO BRONCHOSCOPY: SHX5072

## 2016-11-29 LAB — CBC
HCT: 38.8 % (ref 36.0–46.0)
Hemoglobin: 12.9 g/dL (ref 12.0–15.0)
MCH: 26.3 pg (ref 26.0–34.0)
MCHC: 33.2 g/dL (ref 30.0–36.0)
MCV: 79.2 fL (ref 78.0–100.0)
Platelets: 118 10*3/uL — ABNORMAL LOW (ref 150–400)
RBC: 4.9 MIL/uL (ref 3.87–5.11)
RDW: 15.3 % (ref 11.5–15.5)
WBC: 12 10*3/uL — ABNORMAL HIGH (ref 4.0–10.5)

## 2016-11-29 LAB — COMPREHENSIVE METABOLIC PANEL
ALT: 13 U/L — ABNORMAL LOW (ref 14–54)
AST: 17 U/L (ref 15–41)
Albumin: 3.9 g/dL (ref 3.5–5.0)
Alkaline Phosphatase: 151 U/L — ABNORMAL HIGH (ref 38–126)
Anion gap: 13 (ref 5–15)
BUN: 10 mg/dL (ref 6–20)
CO2: 21 mmol/L — ABNORMAL LOW (ref 22–32)
Calcium: 9.3 mg/dL (ref 8.9–10.3)
Chloride: 105 mmol/L (ref 101–111)
Creatinine, Ser: 0.9 mg/dL (ref 0.44–1.00)
GFR calc Af Amer: >60 mL/min (ref >60)
GFR calc non Af Amer: >60 mL/min (ref >60)
Glucose, Bld: 103 mg/dL — ABNORMAL HIGH (ref 65–99)
Potassium: 3.2 mmol/L — ABNORMAL LOW (ref 3.5–5.1)
Sodium: 139 mmol/L (ref 135–145)
Total Bilirubin: 0.7 mg/dL (ref 0.3–1.2)
Total Protein: 7.1 g/dL (ref 6.5–8.1)

## 2016-11-29 LAB — BLOOD GAS, ARTERIAL
Acid-base deficit: 0.6 mmol/L (ref 0.0–2.0)
Bicarbonate: 23.5 mmol/L (ref 20.0–28.0)
Drawn by: 470591
FIO2: 21
O2 Saturation: 98.6 %
Patient temperature: 98.6
pCO2 arterial: 38 mmHg (ref 32.0–48.0)
pH, Arterial: 7.408 (ref 7.350–7.450)
pO2, Arterial: 127 mmHg — ABNORMAL HIGH (ref 83.0–108.0)

## 2016-11-29 LAB — TYPE AND SCREEN
ABO/RH(D): O POS
Antibody Screen: NEGATIVE

## 2016-11-29 LAB — URINALYSIS, ROUTINE W REFLEX MICROSCOPIC
Bilirubin Urine: NEGATIVE
Glucose, UA: NEGATIVE mg/dL
Hgb urine dipstick: NEGATIVE
Ketones, ur: NEGATIVE mg/dL
Leukocytes, UA: NEGATIVE
Nitrite: NEGATIVE
Protein, ur: 30 mg/dL — AB
Specific Gravity, Urine: 1.014 (ref 1.005–1.030)
pH: 6 (ref 5.0–8.0)

## 2016-11-29 LAB — SURGICAL PCR SCREEN
MRSA, PCR: NEGATIVE
Staphylococcus aureus: NEGATIVE

## 2016-11-29 LAB — PROTIME-INR
INR: 1.03
Prothrombin Time: 13.4 seconds (ref 11.4–15.2)

## 2016-11-29 LAB — APTT: aPTT: 30 seconds (ref 24–36)

## 2016-11-29 SURGERY — BRONCHOSCOPY, VIDEO-ASSISTED
Anesthesia: General | Site: Chest | Laterality: Right

## 2016-11-29 MED ORDER — ONDANSETRON HCL 4 MG/2ML IJ SOLN
4.0000 mg | Freq: Four times a day (QID) | INTRAMUSCULAR | Status: DC | PRN
Start: 2016-11-29 — End: 2016-12-03

## 2016-11-29 MED ORDER — MUPIROCIN 2 % EX OINT
1.0000 "application " | TOPICAL_OINTMENT | Freq: Once | CUTANEOUS | Status: AC
  Administered 2016-11-29: 1 via TOPICAL
  Filled 2016-11-29: qty 22

## 2016-11-29 MED ORDER — MIDAZOLAM HCL 2 MG/2ML IJ SOLN
INTRAMUSCULAR | Status: AC
  Administered 2016-11-29: 2 mg via INTRAVENOUS
  Filled 2016-11-29: qty 2

## 2016-11-29 MED ORDER — ONDANSETRON HCL 4 MG/2ML IJ SOLN
INTRAMUSCULAR | Status: AC
  Filled 2016-11-29: qty 4

## 2016-11-29 MED ORDER — DIPHENHYDRAMINE HCL 50 MG/ML IJ SOLN: 12.5000 mg | Freq: Four times a day (QID) | INTRAMUSCULAR | Status: DC | PRN

## 2016-11-29 MED ORDER — ASPIRIN EC 81 MG PO TBEC
81.0000 mg | DELAYED_RELEASE_TABLET | Freq: Every day | ORAL | Status: DC
  Administered 2016-11-30 – 2016-12-03 (×4): 81 mg via ORAL
  Filled 2016-11-29 (×4): qty 1

## 2016-11-29 MED ORDER — FENTANYL CITRATE (PF) 100 MCG/2ML IJ SOLN
INTRAMUSCULAR | Status: AC
  Administered 2016-11-29: 100 ug via INTRAVENOUS
  Filled 2016-11-29: qty 2

## 2016-11-29 MED ORDER — SUGAMMADEX SODIUM 200 MG/2ML IV SOLN
INTRAVENOUS | Status: AC
  Filled 2016-11-29: qty 4

## 2016-11-29 MED ORDER — LACTATED RINGERS IV SOLN
INTRAVENOUS | Status: DC
  Administered 2016-11-29: 07:00:00 via INTRAVENOUS

## 2016-11-29 MED ORDER — ORAL CARE MOUTH RINSE
15.0000 mL | Freq: Two times a day (BID) | OROMUCOSAL | Status: DC
  Administered 2016-11-29 – 2016-12-03 (×6): 15 mL via OROMUCOSAL

## 2016-11-29 MED ORDER — ENOXAPARIN SODIUM 40 MG/0.4ML ~~LOC~~ SOLN
40.0000 mg | SUBCUTANEOUS | Status: DC
  Administered 2016-11-30 – 2016-12-02 (×3): 40 mg via SUBCUTANEOUS
  Filled 2016-11-29 (×3): qty 0.4

## 2016-11-29 MED ORDER — LACTATED RINGERS IV SOLN: INTRAVENOUS | Status: DC

## 2016-11-29 MED ORDER — EPHEDRINE SULFATE 50 MG/ML IJ SOLN
INTRAMUSCULAR | Status: DC | PRN
  Administered 2016-11-29: 10 mg via INTRAVENOUS
  Administered 2016-11-29: 15 mg via INTRAVENOUS
  Administered 2016-11-29: 10 mg via INTRAVENOUS

## 2016-11-29 MED ORDER — NALOXONE HCL 0.4 MG/ML IJ SOLN: 0.4000 mg | INTRAMUSCULAR | Status: DC | PRN

## 2016-11-29 MED ORDER — ONDANSETRON HCL 4 MG/2ML IJ SOLN: 4.0000 mg | Freq: Four times a day (QID) | INTRAMUSCULAR | Status: DC | PRN

## 2016-11-29 MED ORDER — FENTANYL CITRATE (PF) 100 MCG/2ML IJ SOLN
INTRAMUSCULAR | Status: DC | PRN
  Administered 2016-11-29: 50 ug via INTRAVENOUS
  Administered 2016-11-29: 100 ug via INTRAVENOUS
  Administered 2016-11-29: 50 ug via INTRAVENOUS

## 2016-11-29 MED ORDER — ACETAMINOPHEN 160 MG/5ML PO SOLN
1000.0000 mg | Freq: Four times a day (QID) | ORAL | Status: DC
  Administered 2016-11-30 (×3): 1000 mg via ORAL
  Filled 2016-11-29 (×2): qty 40.6

## 2016-11-29 MED ORDER — PROPOFOL 10 MG/ML IV BOLUS
INTRAVENOUS | Status: DC | PRN
  Administered 2016-11-29: 30 mg via INTRAVENOUS
  Administered 2016-11-29: 120 mg via INTRAVENOUS

## 2016-11-29 MED ORDER — HYDROMORPHONE HCL 1 MG/ML IJ SOLN
INTRAMUSCULAR | Status: AC
  Filled 2016-11-29: qty 1

## 2016-11-29 MED ORDER — DEXAMETHASONE SODIUM PHOSPHATE 10 MG/ML IJ SOLN
INTRAMUSCULAR | Status: AC
  Filled 2016-11-29: qty 2

## 2016-11-29 MED ORDER — PROPOFOL 10 MG/ML IV BOLUS
INTRAVENOUS | Status: AC
  Filled 2016-11-29: qty 20

## 2016-11-29 MED ORDER — FENTANYL CITRATE (PF) 250 MCG/5ML IJ SOLN
INTRAMUSCULAR | Status: AC
  Filled 2016-11-29: qty 5

## 2016-11-29 MED ORDER — FENTANYL 40 MCG/ML IV SOLN
INTRAVENOUS | Status: DC
  Administered 2016-11-29: 30 ug via INTRAVENOUS
  Administered 2016-11-29: 2 ug via INTRAVENOUS
  Administered 2016-11-29: 1000 ug via INTRAVENOUS
  Administered 2016-11-30: 10 ug via INTRAVENOUS
  Administered 2016-11-30: 0 ug via INTRAVENOUS
  Administered 2016-11-30: 30 ug via INTRAVENOUS
  Administered 2016-11-30: 50 ug via INTRAVENOUS
  Administered 2016-11-30: 70 ug via INTRAVENOUS
  Administered 2016-11-30: 60 ug via INTRAVENOUS
  Administered 2016-11-30: 40 ug via INTRAVENOUS
  Administered 2016-12-01: 60 ug via INTRAVENOUS
  Administered 2016-12-01: 10 ug via INTRAVENOUS
  Administered 2016-12-01: 70 ug via INTRAVENOUS
  Administered 2016-12-01: 60 ug via INTRAVENOUS
  Administered 2016-12-01: 40 ug via INTRAVENOUS
  Administered 2016-12-02: 60 ug via INTRAVENOUS
  Administered 2016-12-02: 40 ug via INTRAVENOUS
  Filled 2016-11-29 (×3): qty 25

## 2016-11-29 MED ORDER — BISACODYL 5 MG PO TBEC
10.0000 mg | DELAYED_RELEASE_TABLET | Freq: Every day | ORAL | Status: DC
  Administered 2016-11-30 – 2016-12-03 (×4): 10 mg via ORAL
  Filled 2016-11-29 (×4): qty 2

## 2016-11-29 MED ORDER — LIDOCAINE 2% (20 MG/ML) 5 ML SYRINGE
INTRAMUSCULAR | Status: AC
  Filled 2016-11-29: qty 10

## 2016-11-29 MED ORDER — DEXTROSE-NACL 5-0.9 % IV SOLN
INTRAVENOUS | Status: DC
  Administered 2016-11-29 – 2016-12-02 (×2): via INTRAVENOUS

## 2016-11-29 MED ORDER — DIPHENHYDRAMINE HCL 12.5 MG/5ML PO ELIX
12.5000 mg | ORAL_SOLUTION | Freq: Four times a day (QID) | ORAL | Status: DC | PRN
  Filled 2016-11-29: qty 5

## 2016-11-29 MED ORDER — SENNOSIDES-DOCUSATE SODIUM 8.6-50 MG PO TABS
1.0000 | ORAL_TABLET | Freq: Every day | ORAL | Status: DC
  Administered 2016-11-29 – 2016-12-02 (×4): 1 via ORAL
  Filled 2016-11-29 (×4): qty 1

## 2016-11-29 MED ORDER — NITROGLYCERIN 0.4 MG SL SUBL: 0.4000 mg | SUBLINGUAL_TABLET | SUBLINGUAL | Status: DC | PRN

## 2016-11-29 MED ORDER — ONDANSETRON HCL 4 MG/2ML IJ SOLN
INTRAMUSCULAR | Status: DC | PRN
  Administered 2016-11-29: 4 mg via INTRAVENOUS

## 2016-11-29 MED ORDER — LACTATED RINGERS IV SOLN
INTRAVENOUS | Status: DC | PRN
  Administered 2016-11-29: 09:00:00 via INTRAVENOUS

## 2016-11-29 MED ORDER — HYDROMORPHONE HCL 1 MG/ML IJ SOLN
0.2500 mg | INTRAMUSCULAR | Status: DC | PRN
  Administered 2016-11-29 (×2): 0.5 mg via INTRAVENOUS

## 2016-11-29 MED ORDER — VANCOMYCIN HCL IN DEXTROSE 1-5 GM/200ML-% IV SOLN
1000.0000 mg | INTRAVENOUS | Status: AC
  Administered 2016-11-29: 1000 mg via INTRAVENOUS
  Filled 2016-11-29: qty 200

## 2016-11-29 MED ORDER — MEPERIDINE HCL 25 MG/ML IJ SOLN: 6.2500 mg | INTRAMUSCULAR | Status: DC | PRN

## 2016-11-29 MED ORDER — VANCOMYCIN HCL IN DEXTROSE 1-5 GM/200ML-% IV SOLN
1000.0000 mg | Freq: Two times a day (BID) | INTRAVENOUS | Status: AC
  Administered 2016-11-29: 1000 mg via INTRAVENOUS
  Filled 2016-11-29: qty 200

## 2016-11-29 MED ORDER — SODIUM CHLORIDE 0.9% FLUSH: 9.0000 mL | INTRAVENOUS | Status: DC | PRN

## 2016-11-29 MED ORDER — METOPROLOL TARTRATE 25 MG PO TABS
25.0000 mg | ORAL_TABLET | Freq: Two times a day (BID) | ORAL | Status: DC
  Administered 2016-11-30 – 2016-12-03 (×7): 25 mg via ORAL
  Filled 2016-11-29 (×7): qty 1

## 2016-11-29 MED ORDER — 0.9 % SODIUM CHLORIDE (POUR BTL) OPTIME
TOPICAL | Status: DC | PRN
  Administered 2016-11-29: 2000 mL

## 2016-11-29 MED ORDER — PHENYLEPHRINE HCL 10 MG/ML IJ SOLN
INTRAMUSCULAR | Status: DC | PRN
  Administered 2016-11-29: 30 ug/min via INTRAVENOUS

## 2016-11-29 MED ORDER — LIDOCAINE HCL (CARDIAC) 20 MG/ML IV SOLN
INTRAVENOUS | Status: DC | PRN
  Administered 2016-11-29: 80 mg via INTRAVENOUS

## 2016-11-29 MED ORDER — ROCURONIUM BROMIDE 10 MG/ML (PF) SYRINGE
PREFILLED_SYRINGE | INTRAVENOUS | Status: AC
  Filled 2016-11-29: qty 10

## 2016-11-29 MED ORDER — MIDAZOLAM HCL 2 MG/2ML IJ SOLN
INTRAMUSCULAR | Status: AC
  Filled 2016-11-29: qty 2

## 2016-11-29 MED ORDER — DEXAMETHASONE SODIUM PHOSPHATE 10 MG/ML IJ SOLN
INTRAMUSCULAR | Status: DC | PRN
  Administered 2016-11-29: 5 mg via INTRAVENOUS

## 2016-11-29 MED ORDER — ALBUTEROL SULFATE (2.5 MG/3ML) 0.083% IN NEBU: 2.5000 mg | INHALATION_SOLUTION | Freq: Four times a day (QID) | RESPIRATORY_TRACT | Status: DC | PRN

## 2016-11-29 MED ORDER — FENTANYL CITRATE (PF) 100 MCG/2ML IJ SOLN
100.0000 ug | Freq: Once | INTRAMUSCULAR | Status: AC
  Administered 2016-11-29: 100 ug via INTRAVENOUS

## 2016-11-29 MED ORDER — POTASSIUM CHLORIDE 10 MEQ/50ML IV SOLN
10.0000 meq | Freq: Every day | INTRAVENOUS | Status: DC | PRN
  Administered 2016-11-30 (×3): 10 meq via INTRAVENOUS
  Filled 2016-11-29 (×5): qty 50

## 2016-11-29 MED ORDER — PHENYLEPHRINE 40 MCG/ML (10ML) SYRINGE FOR IV PUSH (FOR BLOOD PRESSURE SUPPORT)
PREFILLED_SYRINGE | INTRAVENOUS | Status: AC
  Filled 2016-11-29: qty 10

## 2016-11-29 MED ORDER — ACETAMINOPHEN 500 MG PO TABS
1000.0000 mg | ORAL_TABLET | Freq: Four times a day (QID) | ORAL | Status: DC
  Administered 2016-11-29 – 2016-12-03 (×13): 1000 mg via ORAL
  Filled 2016-11-29 (×15): qty 2

## 2016-11-29 MED ORDER — ATORVASTATIN CALCIUM 80 MG PO TABS
80.0000 mg | ORAL_TABLET | Freq: Every day | ORAL | Status: DC
  Administered 2016-11-30 – 2016-12-03 (×4): 80 mg via ORAL
  Filled 2016-11-29 (×3): qty 1
  Filled 2016-11-29: qty 2

## 2016-11-29 MED ORDER — MIDAZOLAM HCL 2 MG/2ML IJ SOLN
2.0000 mg | Freq: Once | INTRAMUSCULAR | Status: AC
  Administered 2016-11-29: 2 mg via INTRAVENOUS

## 2016-11-29 MED ORDER — ROCURONIUM BROMIDE 100 MG/10ML IV SOLN
INTRAVENOUS | Status: DC | PRN
  Administered 2016-11-29: 50 mg via INTRAVENOUS
  Administered 2016-11-29: 20 mg via INTRAVENOUS

## 2016-11-29 MED ORDER — PHENYLEPHRINE HCL 10 MG/ML IJ SOLN
INTRAMUSCULAR | Status: DC | PRN
  Administered 2016-11-29 (×4): 120 ug via INTRAVENOUS

## 2016-11-29 MED ORDER — AMLODIPINE BESYLATE 10 MG PO TABS
10.0000 mg | ORAL_TABLET | Freq: Every day | ORAL | Status: DC
  Administered 2016-11-30 – 2016-12-03 (×4): 10 mg via ORAL
  Filled 2016-11-29 (×4): qty 1

## 2016-11-29 MED ORDER — PROMETHAZINE HCL 25 MG/ML IJ SOLN: 6.2500 mg | INTRAMUSCULAR | Status: DC | PRN

## 2016-11-29 SURGICAL SUPPLY — 88 items
APPLICATOR TIP COSEAL (VASCULAR PRODUCTS) IMPLANT
APPLICATOR TIP EXT COSEAL (VASCULAR PRODUCTS) IMPLANT
BLADE SURG 11 STRL SS (BLADE) IMPLANT
BRUSH CYTOL CELLEBRITY 1.5X140 (MISCELLANEOUS) IMPLANT
CANISTER SUCT 3000ML PPV (MISCELLANEOUS) ×3 IMPLANT
CATH KIT ON Q 5IN SLV (PAIN MANAGEMENT) IMPLANT
CATH THORACIC 28FR (CATHETERS) ×3 IMPLANT
CATH THORACIC 36FR (CATHETERS) IMPLANT
CATH THORACIC 36FR RT ANG (CATHETERS) IMPLANT
CLIP VESOCCLUDE MED 6/CT (CLIP) IMPLANT
CONN ST 1/4X3/8  BEN (MISCELLANEOUS) ×1
CONN ST 1/4X3/8 BEN (MISCELLANEOUS) ×2 IMPLANT
CONT SPEC 4OZ CLIKSEAL STRL BL (MISCELLANEOUS) ×9 IMPLANT
COVER BACK TABLE 60X90IN (DRAPES) IMPLANT
DERMABOND ADVANCED (GAUZE/BANDAGES/DRESSINGS) ×1
DERMABOND ADVANCED .7 DNX12 (GAUZE/BANDAGES/DRESSINGS) ×2 IMPLANT
DRAIN CHANNEL 28F RND 3/8 FF (WOUND CARE) ×3 IMPLANT
DRAIN CHANNEL 32F RND 10.7 FF (WOUND CARE) IMPLANT
DRAPE LAPAROSCOPIC ABDOMINAL (DRAPES) ×3 IMPLANT
DRAPE SLUSH/WARMER DISC (DRAPES) ×3 IMPLANT
DRAPE WARM FLUID 44X44 (DRAPE) IMPLANT
DRILL BIT 7/64X5 (BIT) IMPLANT
ELECT BLADE 4.0 EZ CLEAN MEGAD (MISCELLANEOUS) ×3
ELECT BLADE 6.5 EXT (BLADE) ×3 IMPLANT
ELECT REM PT RETURN 9FT ADLT (ELECTROSURGICAL) ×3
ELECTRODE BLDE 4.0 EZ CLN MEGD (MISCELLANEOUS) ×2 IMPLANT
ELECTRODE REM PT RTRN 9FT ADLT (ELECTROSURGICAL) ×2 IMPLANT
FORCEPS BIOP RJ4 1.8 (CUTTING FORCEPS) IMPLANT
GAUZE SPONGE 4X4 12PLY STRL (GAUZE/BANDAGES/DRESSINGS) ×3 IMPLANT
GAUZE SPONGE 4X4 12PLY STRL LF (GAUZE/BANDAGES/DRESSINGS) ×3 IMPLANT
GLOVE BIO SURGEON STRL SZ 6.5 (GLOVE) ×15 IMPLANT
GLOVE BIO SURGEON STRL SZ7 (GLOVE) ×12 IMPLANT
GLOVE BIO SURGEON STRL SZ7.5 (GLOVE) ×9 IMPLANT
GLOVE BIOGEL PI IND STRL 7.0 (GLOVE) ×8 IMPLANT
GLOVE BIOGEL PI INDICATOR 7.0 (GLOVE) ×4
GLOVE INDICATOR 6.5 STRL GRN (GLOVE) ×9 IMPLANT
GOWN STRL REUS W/ TWL LRG LVL3 (GOWN DISPOSABLE) ×10 IMPLANT
GOWN STRL REUS W/TWL LRG LVL3 (GOWN DISPOSABLE) ×5
KIT BASIN OR (CUSTOM PROCEDURE TRAY) ×3 IMPLANT
KIT CLEAN ENDO COMPLIANCE (KITS) IMPLANT
KIT ROOM TURNOVER OR (KITS) ×3 IMPLANT
KIT SUCTION CATH 14FR (SUCTIONS) ×6 IMPLANT
MARKER SKIN DUAL TIP RULER LAB (MISCELLANEOUS) IMPLANT
NS IRRIG 1000ML POUR BTL (IV SOLUTION) ×6 IMPLANT
OIL SILICONE PENTAX (PARTS (SERVICE/REPAIRS)) ×3 IMPLANT
PACK CHEST (CUSTOM PROCEDURE TRAY) ×3 IMPLANT
PAD ARMBOARD 7.5X6 YLW CONV (MISCELLANEOUS) ×9 IMPLANT
PASSER SUT SWANSON 36MM LOOP (INSTRUMENTS) IMPLANT
SCISSORS LAP 5X35 DISP (ENDOMECHANICALS) IMPLANT
SEALANT SURG COSEAL 4ML (VASCULAR PRODUCTS) IMPLANT
SEALANT SURG COSEAL 8ML (VASCULAR PRODUCTS) IMPLANT
SOLUTION ANTI FOG 6CC (MISCELLANEOUS) ×3 IMPLANT
STAPLE RELOAD 45MM GOLD (STAPLE) ×18 IMPLANT
STAPLER ECHELON POWERED (MISCELLANEOUS) ×3 IMPLANT
SUT PROLENE 3 0 SH DA (SUTURE) IMPLANT
SUT PROLENE 4 0 RB 1 (SUTURE)
SUT PROLENE 4-0 RB1 .5 CRCL 36 (SUTURE) IMPLANT
SUT SILK  1 MH (SUTURE) ×3
SUT SILK 1 MH (SUTURE) ×6 IMPLANT
SUT SILK 1 TIES 10X30 (SUTURE) IMPLANT
SUT SILK 2 0 SH (SUTURE) IMPLANT
SUT SILK 2 0SH CR/8 30 (SUTURE) IMPLANT
SUT SILK 3 0SH CR/8 30 (SUTURE) IMPLANT
SUT STEEL 1 (SUTURE) IMPLANT
SUT VIC AB 0 CTX 18 (SUTURE) IMPLANT
SUT VIC AB 1 CTX 18 (SUTURE) ×3 IMPLANT
SUT VIC AB 1 CTX 36 (SUTURE)
SUT VIC AB 1 CTX36XBRD ANBCTR (SUTURE) IMPLANT
SUT VIC AB 2-0 CTX 36 (SUTURE) ×3 IMPLANT
SUT VIC AB 2-0 UR6 27 (SUTURE) IMPLANT
SUT VIC AB 3-0 SH 8-18 (SUTURE) IMPLANT
SUT VIC AB 3-0 X1 27 (SUTURE) ×3 IMPLANT
SUT VICRYL 0 UR6 27IN ABS (SUTURE) IMPLANT
SUT VICRYL 2 TP 1 (SUTURE) IMPLANT
SYR 20ML ECCENTRIC (SYRINGE) IMPLANT
SYSTEM SAHARA CHEST DRAIN ATS (WOUND CARE) ×3 IMPLANT
TAPE CLOTH SURG 4X10 WHT LF (GAUZE/BANDAGES/DRESSINGS) ×3 IMPLANT
TAPE UMBILICAL COTTON 1/8X30 (MISCELLANEOUS) IMPLANT
TOWEL GREEN STERILE (TOWEL DISPOSABLE) ×3 IMPLANT
TOWEL GREEN STERILE FF (TOWEL DISPOSABLE) IMPLANT
TOWEL OR 17X24 6PK STRL BLUE (TOWEL DISPOSABLE) ×6 IMPLANT
TOWEL OR 17X26 10 PK STRL BLUE (TOWEL DISPOSABLE) ×6 IMPLANT
TRAP SPECIMEN MUCOUS 40CC (MISCELLANEOUS) IMPLANT
TRAY FOLEY W/METER SILVER 16FR (SET/KITS/TRAYS/PACK) ×3 IMPLANT
TROCAR BLADELESS 12MM (ENDOMECHANICALS) IMPLANT
TUBE CONNECTING 20X1/4 (TUBING) IMPLANT
TUNNELER SHEATH ON-Q 11GX8 DSP (PAIN MANAGEMENT) IMPLANT
WATER STERILE IRR 1000ML POUR (IV SOLUTION) ×6 IMPLANT

## 2016-11-29 NOTE — Progress Notes (Signed)
[address]       [place] [address]             [phone]      Leah [name] [place] Medical Record #[id number] Date of Birth: 04/23/1950  Referring: No ref. provider found Primary Care: Guadalupe Dawn, MD  Chief Complaint:   POST OP FOLLOW UP 12/05/2014  OPERATIVE REPORT PREOPERATIVE DIAGNOSES: 1. Severe mitral insufficiency. 2. Aortic insufficiency. 3. Coronary occlusive disease. POSTOPERATIVE DIAGNOSES: 1. Severe mitral insufficiency. 2. Aortic insufficiency. 3. Coronary occlusive disease. 4. Aortic insufficiency, mild. PROCEDURE PERFORMED: Mitral valve replacement with [facility] pericardial tissue valve, model 7300TFX 29 mm, serial #[id number] and coronary artery bypass grafting x1 with the left internal mammary to the left anterior descending coronary artery. Closure of left atrial appendage. SURGEON: Lanelle Bal, MD.  History of Present Illness:     Patienthas been followed in office for  evaluation of a small right upper lobe lung nodule. This nodule is been present for several years but is slowly increasing in size, from 4-5 mm to 11 mm. Follow-up scan from May until now shows fairly stable size but definitely bigger than scans done in 2016. Patient has had recent  cardiac clearance , PET scan and PFT's  Patient notes she has no longer smoking. She has become more physically active but is limited by pain in especially her left leg and hip with ambulation. She denies chest pain or angina.    Past Medical History:  Diagnosis Date  . Anemia   . CAD (coronary artery disease)    a. s/p LIMA-LAD in 11/2014  . Carotid artery occlusion   . Complication of anesthesia    slow to awaken x 1 maybe 2001  . COPD (chronic obstructive pulmonary disease) ([place])   . GERD (gastroesophageal reflux disease)    "sometimes" takes Copywriter, advertising or drinks gingerale   . Headache(784.0)   . History of kidney stones   . Hyperlipidemia    . Hypertension   . Leg pain   . Peripheral vascular disease ([place])   . Renal vascular disease 10/26/2014   bilateral stents placed   . Severe mitral regurgitation    a. s/p MVR in 11/2014 with a pericardial tissue valve  . Shortness of breath dyspnea   . Tobacco abuse      History  Smoking Status  . Former Smoker  . Packs/day: 0.50  . Years: 35.00  . Types: Cigarettes  . Quit date: 11/25/2016  Smokeless Tobacco  . Never Used    Comment: has not started wellbutrin yet    History  Alcohol Use No     Allergies  Allergen Reactions  . Lisinopril Swelling    Angioedema 06/10/11  . Aspirin Other (See Comments)    Upset stomach  . Chantix [Varenicline]     insomnia  . Penicillins Hives    Has patient had a PCN reaction causing immediate rash, facial/tongue/throat swelling, SOB or lightheadedness with hypotension: Yes Has patient had a PCN reaction causing severe rash involving mucus membranes or skin necrosis: No Has patient had a PCN reaction that required hospitalization: No Has patient had a PCN reaction occurring within the last 10 years: No If all of the above answers are "NO", then may proceed with Cephalosporin use.     Current Facility-Administered Medications  Medication Dose Route Frequency Provider Last Rate Last Dose  . fentaNYL (SUBLIMAZE) 100 MCG/2ML injection           .  lactated ringers infusion   Intravenous Continuous Effie Berkshire, MD 10 mL/hr at 11/29/16 [phone]    . midazolam (VERSED) 2 MG/2ML injection           . vancomycin (VANCOCIN) IVPB 1000 mg/200 mL premix  1,000 mg Intravenous On Call to OR [name], MD           Physical Exam: BP 127/73   Pulse 65   Temp 98 F (36.7 C) (Oral)   Resp 18   Ht 6\' 1"  (1.854 m)   Wt 177 lb (80.3 kg)   SpO2 99%   BMI 23.35 kg/m   Physical Exam  Vitals reviewed. Constitutional: She is oriented to person, place, and time.  HENT:  Head: Normocephalic and atraumatic.  Mouth/Throat: No  oropharyngeal exudate.  Neck: No JVD present. No tracheal deviation present. No thyromegaly present.  Cardiovascular: Normal rate, regular rhythm and intact distal pulses.  Exam reveals no gallop and no friction rub.   No murmur heard. Respiratory: No stridor. No respiratory distress. She has no wheezes. She has no rales. She exhibits no tenderness.  GI: She exhibits no distension. There is no tenderness. There is no rebound.  Musculoskeletal: She exhibits no edema, tenderness or deformity.  Lymphadenopathy:    She has no cervical adenopathy.  Neurological: She is alert and oriented to person, place, and time. No cranial nerve deficit.  Skin: Skin is warm and dry. She is not diaphoretic.  Psychiatric: She has a normal mood and affect. Her behavior is normal. Judgment and thought content normal.  chest incision well healed   Diagnostic Studies & Laboratory data:     Recent Radiology Findings:  Study Result   CLINICAL DATA:  66 year old female with greater than 30 pack-year history of smoking, for initial lung cancer screening  EXAM: CT CHEST WITHOUT CONTRAST LOW-DOSE FOR LUNG CANCER SCREENING  TECHNIQUE: Multidetector CT imaging of the chest was performed following the standard protocol without IV contrast.  COMPARISON:  Multiple prior CTs, most recently 09/21/2015  FINDINGS: Cardiovascular: The heart is normal in size. No pericardial effusion. Prosthetic mitral valve.  Three vessel coronary atherosclerosis.  No evidence of thoracic aortic aneurysm. Atherosclerotic calcifications of the aortic arch.  Mediastinum/Nodes: No suspicious mediastinal lymphadenopathy.  Visualized thyroid is unremarkable.  Lungs/Pleura: 9.8 mm (volumetric mean) spiculated nodule in the anterior right upper lobe (series 3/ image 65). This has significantly progressed from the prior studies.  Additional scattered small bilateral pulmonary nodules measuring up to 3.8 mm.  3.2 mm  calcified granuloma in the right upper lobe.  Underlying mild centrilobular and paraseptal emphysematous changes, upper lobe predominant.  Mild biapical pleural-parenchymal scarring.  Mild linear scarring/ atelectasis in the left lower lobe. No focal consolidation.  No pleural effusion or pneumothorax.  Upper Abdomen: Visualized upper abdomen is unremarkable.  Musculoskeletal: Median sternotomy.  IMPRESSION: 9.8 mm spiculated nodule in the anterior right upper lobe. Lung-RADS Category 4A, suspicious. Follow up low-dose chest CT without contrast in 3 months (please use the following order, "CT CHEST LCS NODULE FOLLOW-UP W/O CM") is recommended. Alternatively, PET may be considered.   Electronically Signed   By: Julian Hy M.D.   On: 07/29/2016 14:24     Ct Chest Wo Contrast  09/21/2015  CLINICAL DATA:  Follow-up pulmonary nodule.  Current smoker. EXAM: CT CHEST WITHOUT CONTRAST TECHNIQUE: Multidetector CT imaging of the chest was performed following the standard protocol without IV contrast. COMPARISON:  06/08/2015 chest CT. FINDINGS: Mediastinum/Nodes: Mild cardiomegaly. No  significant pericardial fluid/thickening. Left anterior, left circumflex and right coronary atherosclerosis status post CABG with left internal mammary bypass graft. Mitral valve prosthesis is in place. Atherosclerotic nonaneurysmal thoracic aorta. Normal caliber pulmonary arteries. No discrete thyroid nodules. Unremarkable esophagus. No pathologically enlarged axillary, mediastinal or gross hilar lymph nodes, noting limited sensitivity for the detection of hilar adenopathy on this noncontrast study. Lungs/Pleura: No pneumothorax. No pleural effusion. Mild centrilobular emphysema and mild diffuse bronchial wall thickening. Apical right upper lobe 3 mm solid pulmonary nodule (series 4/ image 31) has continued to decrease in size, previously 5 mm on 06/08/2015 using similar measurement technique.  Additional scattered 2-3 mm centrilobular pulmonary nodules in the upper lungs bilaterally are not appreciably changed and are considered benign. No acute consolidative airspace disease or new significant pulmonary nodules. Stable parenchymal band in the anterior left lower lobe. Upper abdomen: Partially visualized bilateral proximal renal artery stents. Musculoskeletal: No aggressive appearing focal osseous lesions. Sternotomy wires appear aligned and intact. IMPRESSION: 1. Continued reduction in the size of the apical right upper lobe pulmonary nodule, now 3 mm, consistent with resolving inflammatory nodule. 2. Mild centrilobular emphysema and mild diffuse bronchial wall thickening, suggesting COPD. No acute pulmonary disease. 3. Three-vessel coronary atherosclerosis status post CABG. Stable mild cardiomegaly. Electronically Signed   By: Ilona Sorrel M.D.   On: 09/21/2015 11:10   I have independently reviewed the above radiology studies  and reviewed the findings with the patient.   Ct Super D Chest Wo Contrast  06/08/2015  CLINICAL DATA:  Followup of right apical pulmonary nodule. Prior CABG and valve replacement. Ex-smoker. EXAM: CT CHEST WITHOUT CONTRAST TECHNIQUE: Multidetector CT imaging of the chest was performed using thin slice collimation for electromagnetic bronchoscopy planning purposes, without intravenous contrast. COMPARISON:  03/09/2015 and 10/27/2014. FINDINGS: Mediastinum/Lymph Nodes: Aortic and branch vessel atherosclerosis. Moderate cardiomegaly with mitral valve repair. Prior median sternotomy. No mediastinal or definite hilar adenopathy, given limitations of unenhanced CT. Lungs/Pleura: No pleural fluid.  Mild centrilobular emphysema. The right apical complex nodule is less well-defined today. Solid component measures 4 mm on image 17/series 4 versus 5 mm on the prior. nodule in total measures 8 mm today versus 12 mm on the prior. On sagittal reformats today, 4 mm soft tissue component  on image 43 versus 5 mm on the prior. 9 mm overall lesion size today versus 11 mm on the prior (when remeasured). Scarring at the left lung base. Upper abdomen: Normal imaged portions of the liver, spleen, stomach, pancreas, gallbladder, adrenal glands, kidneys. Bilateral renal artery stents. Musculoskeletal: No acute osseous abnormality. IMPRESSION: 1. Decreased size/definition of right apical pulmonary nodule. This suggests resolving infectious or inflammatory etiology. Recommend imaging surveillance with at least 1 more 3- 6 month follow-up. 2.  No acute process in the chest. 3. Cardiomegaly with prior median sternotomy for CABG and mitral valve repair. Electronically Signed   By: Abigail Miyamoto M.D.   On: 06/08/2015 12:37    Ct Chest Wo Contrast  03/09/2015  CLINICAL DATA:  Followup pulmonary nodule. EXAM: CT CHEST WITHOUT CONTRAST TECHNIQUE: Multidetector CT imaging of the chest was performed following the standard protocol without IV contrast. COMPARISON:  Chest CT 10/27/2014 and 08/25/2014 FINDINGS: Mediastinum/Nodes: No breast masses, supraclavicular or axillary lymphadenopathy. The thyroid gland is grossly normal. The heart is normal in size. No pericardial effusion. Stable tortuosity, ectasia and calcifications involving the thoracic aorta. Prosthetic aortic valve is noted along with coronary artery bypass grafts. No mediastinal or hilar mass or adenopathy. The  esophagus is grossly normal. Lungs/Pleura: The lungs are clear of acute process. Stable emphysematous changes. Again demonstrated is a right upper lobe pulmonary lesion which is partly solid and partly cystic. The solid component measures slightly larger on the coronal images. It measures 7.5 mm on image number 53 of series 601. It previously measured 6 mm. The cystic component has a thicker ram than on the prior examinations. The largest measurement on the axial images of 11.5 mm and was previously 10.5 mm. Could not exclude a slow growing  neoplasm. PET-CT may be helpful for further evaluation. It would not be an reasonable to continue to follow this with CT with a repeat CT scan in 4-6 months. No new regions.  No acute pulmonary findings.  No pleural effusion. Upper abdomen: No significant upper abdominal findings. Musculoskeletal: No significant bony findings. IMPRESSION: Slight interval increase in size of the right apical lung lesion as described above. Continued CT follow-up (4-6 months) or PET-CT are both reasonable. No new pulmonary lesions or acute pulmonary findings. Stable emphysematous changes. No mediastinal or hilar mass or adenopathy. Electronically Signed   By: Marijo Sanes M.D.   On: 03/09/2015 13:30       CLINICAL DATA: Re-evaluate right upper lobe pulmonary nodule, prior to possible valve surgery. Initial encounter.  EXAM: CT CHEST WITHOUT CONTRAST  TECHNIQUE: Multidetector CT imaging of the chest was performed following the standard protocol without IV contrast.  COMPARISON: CT of the chest performed 08/25/2014  FINDINGS: The small slightly spiculated nodule at the right lung apex is again seen; there is a small associated cystic component which appears to have increased mildly in size, while the solid component has decreased slightly in size, measuring approximately 5 mm. Given the slight apparent interval decrease in size of the solid component from the recent prior study, this is thought more likely to be postinfectious in nature, though malignancy cannot be entirely excluded.  No additional pulmonary nodules are seen. The lungs are otherwise clear, aside from mild scarring or atelectasis at the left lung base. No pleural effusion or pneumothorax is seen.  Scattered coronary artery calcifications are noted. The mediastinum is otherwise unremarkable. No mediastinal lymphadenopathy is seen. No pericardial effusion is identified. The great vessels are grossly unremarkable, aside from  scattered calcification. Mild calcification is noted along the thoracic aorta. The visualized portions of the thyroid gland are unremarkable. No axillary lymphadenopathy is appreciated.  No acute osseous abnormalities are identified.  IMPRESSION: 1. Small slightly spiculated nodule at the right lung apex is again seen. It has a small cystic component which appears to have increased mildly in size, while the solid component has decreased slightly in size, measuring approximately 5 mm. Given the slight apparent interval decrease in size of the solid component from the recent prior CT, this is thought more likely to be postinfectious in nature, though malignancy cannot be entirely excluded. As this remains too small to further characterize, would recommend strict follow-up CT of the chest in 3-6 months, to ensure stability. 2. Mild scarring or atelectasis at the left lung base. 3. Scattered coronary artery calcifications seen.   Electronically Signed  By: Garald Balding M.D.  On: 10/27/2014 19:40  Study Result   CLINICAL DATA:  Initial treatment strategy for right upper lobe lung nodule.  EXAM: NUCLEAR MEDICINE PET SKULL BASE TO THIGH  TECHNIQUE: 10.0 mCi F-18 FDG was injected intravenously. Full-ring PET imaging was performed from the skull base to thigh after the radiotracer. CT data was obtained  and used for attenuation correction and anatomic localization.  FASTING BLOOD GLUCOSE:  Value: 113 mg/dl  COMPARISON:  Multiple exams, including chest CT from 11/12/2016  FINDINGS: NECK  No hypermetabolic lymph nodes in the neck. Atherosclerotic calcification of the carotid arteries. Symmetric glottic activity is thought to likely be physiologic.  CHEST  1.0 by 0.7 cm right upper lobe pulmonary nodule on image 18 series 7, maximum SUV 3.4, suspicious for malignancy.  A small right axillary lymph node with fatty hilum, total nodes size 10 mm but with the  fatty hilum making up a large portion of this measurement has a maximum SUV of 2.1 and is likely benign. Background mediastinal blood pool activity 2.9.  No hypermetabolic mediastinal or hilar adenopathy.  Coronary, aortic arch, and branch vessel atherosclerotic vascular disease. Mild cardiomegaly.  ABDOMEN/PELVIS  No abnormal hypermetabolic activity within the liver, pancreas, adrenal glands, or spleen. No hypermetabolic lymph nodes in the abdomen or pelvis.  Aortoiliac atherosclerotic vascular disease. Abdominal aortic ectasia at 2.9 cm diameter.  Dependent density in the gallbladder potentially from sludge or gallstones. Suspected 2 mm right kidney lower pole nonobstructive renal calculus. Suspected pelvic floor laxity.  SKELETON  No focal hypermetabolic activity to suggest skeletal metastasis.  IMPRESSION: 1. Hypermetabolic 1.0 cm right upper lobe pulmonary nodule suspicious for malignancy. No hypermetabolic adenopathy or metastatic disease. Assuming non-small cell lung cancer the appearance is most compatible with T1a N0 M0 disease (stage 1A). 2. Aortic Atherosclerosis (ICD10-I70.0). Coronary atherosclerosis with mild cardiomegaly. 3. Mild nonobstructive right nephrolithiasis. 4. Sludge or possibly gallstones in the gallbladder.   Electronically Signed   By: Van Clines M.D.   On: 11/21/2016 14:26       Recent Lab Findings: Lab Results  Component Value Date   WBC 12.0 (H) 11/29/2016   HGB 12.9 11/29/2016   HCT 38.8 11/29/2016   PLT 118 (L) 11/29/2016   GLUCOSE 103 (H) 11/29/2016   CHOL 246 (H) 11/18/2016   TRIG 96 11/18/2016   HDL 51 11/18/2016   LDLDIRECT 185 (A) 09/09/2011   LDLCALC 176 (H) 11/18/2016   ALT 13 (L) 11/29/2016   AST 17 11/29/2016   NA 139 11/29/2016   K 3.2 (L) 11/29/2016   CL 105 11/29/2016   CREATININE 0.90 11/29/2016   BUN 10 11/29/2016   CO2 21 (L) 11/29/2016   TSH 1.756 11/28/2014   INR 1.03 11/29/2016    HGBA1C 5.6 01/15/2016   Notes recorded by Erma Heritage, PA-C on 11/20/2016 at 5:32 PM EDT Please let the patient know her stress test showed evidence of her prior infarction but no ischemia that would be concerning for a potential blockage. Her EF was reduced, therefore would recommend a limited echo to reassess EF as this can sometimes be read as falsely low on a stress test (EF was 45-50% by echo in 2016). She does not need to have the echocardiogram performed prior to surgery as this can be scheduled at a later date. Is cleared from a cardiac perspective to proceed with surgery.      Vitals   Height Weight BMI (Calculated)  6\' 1"  (1.854 m) 177 lb (80.3 kg) 23.36  Study Highlights    Nuclear stress EF: 31%. The left ventricular ejection fraction is moderately decreased (30-44%).  Defect 1: There is a medium defect of moderate severity present in the apical anterior, apical septal, apical inferior and apex location.  Findings consistent with prior apical myocardial infarction.  This is a high risk study based  on severely reduced LV function . There is no evidence of ischemia     PFT"S fev1 2.11 76%  dlco 13.88  39%   Interpretation: The FVC, FEV1 and FEV1/FVC ratio are reduced, but the FEF25-75% is within normal limits. The airway resistance is normal. Lung volumes are within normal limits. Following administration of bronchodilators, there is no significant response. The reduced diffusing capacity indicates a severe loss of functional alveolar capillary surface. However, the diffusing capacity was not corrected for the patient's hemoglobin. Conclusions: Although there is airway obstruction and a diffusion defect suggesting emphysema, the absence of overinflation is inconsistent with that diagnosis. In view of the severity of the diffusion defect, studies with exercise would be helpful to evaluate the presence of hypoxemia. Pulmonary Function Diagnosis: Mild Obstructive  Airways Disease Severe Diffusion Defect    6 min walk test  6 Minute Walk Test Results  Patient:            Braeley Buskey [facility] Date:                11/27/2016   Supplemental O2 during test?            none                                       Baseline                                 End   Time                            1658                                        1703 Heartrate                     70                                            87 Dyspnea                      no                                            no Fatigue                        no                                            Left leg pain O2 sat                          98%  99% Blood pressure            122/80                                     128/87   Patient ambulated at a fast pace for a total distance of 540 feet with 1 stop.  Ambulation was limited primarily due to left leg and hip pain.  Overall the test was tolerated well   ECHO: 03/10/2015 Study Conclusions  - Left ventricle: The cavity size was normal. Wall thickness was normal. Systolic function was mildly reduced. The estimated ejection fraction was in the range of 45% to 50%. There is hypokinesis of the basal-mid septal myocardium. - Aortic valve: There was moderate regurgitation. - Mitral valve: A bioprosthesis was present. - Left atrium: The atrium was mildly dilated.  Impressions:  - Basal septal hypokinesis with mildly reduced LV function; aortic valve not well visualized but probable moderate AI (2+); s/p MVR with no obvious MR; trace TR.  Transthoracic echocardiography. M-mode, complete 2D, spectral Doppler, and color Doppler. Birthdate: Patient birthdate: 1950-06-29. Age: Patient is 66 yr old. Sex: Gender: female. BMI: 23.9 kg/m^2. Blood pressure:   106/64 Patient status: Outpatient. Study date: Study date: 03/10/2015. Study time: 02:26 PM.  Location: [place] [facility]  -------------------------------------------------------------------  ------------------------------------------------------------------- Left ventricle: The cavity size was normal. Wall thickness was normal. Systolic function was mildly reduced. The estimated ejection fraction was in the range of 45% to 50%. Regional wall motion abnormalities: There is hypokinesis of the basal-mid septal myocardium.  ------------------------------------------------------------------- Aortic valve: Poorly visualized. Doppler: Transvalvular velocity was within the normal range. There was no stenosis. There was moderate regurgitation.  ------------------------------------------------------------------- Aorta: Aortic root: The aortic root was normal in size.  ------------------------------------------------------------------- Mitral valve: A bioprosthesis was present. Doppler: There was no regurgitation.  Peak gradient (D): 8 mm Hg.  ------------------------------------------------------------------- Left atrium: The atrium was mildly dilated.  ------------------------------------------------------------------- Right ventricle: The cavity size was normal. Systolic function was normal.  ------------------------------------------------------------------- Pulmonic valve:  Doppler: Transvalvular velocity was within the normal range. There was no evidence for stenosis.  ------------------------------------------------------------------- Tricuspid valve:  Structurally normal valve.  Doppler: Transvalvular velocity was within the normal range. There was trivial regurgitation.  ------------------------------------------------------------------- Pulmonary artery:  Systolic pressure was within the normal range.  ------------------------------------------------------------------- Right atrium: The atrium was normal in  size.  ------------------------------------------------------------------- Pericardium: There was no pericardial effusion.  ------------------------------------------------------------------- Systemic veins: Inferior vena cava: The vessel was normal in size.   Assessment / Plan: In June 2016 she was noted to have a very small right upper lobe lung nodule preoperatively a repeat scan was done in August, that showed the solid component to decrease in size. On previous  CT of the chest  right upper lobe mass Decreased size/definition of right apical pulmonary nodule. This suggests resolving infectious or inflammatory etiology  A low dose screening CT was done in May of this year, suggesting that the nodule was now increasing in size , follow up scan scan  shows persistent of the right upper lobe lung lesion now larger than it was in 2016.   With the small size and deep in the upper lobe CT directed needle biopsy may be difficult and or navigation bronchoscopy will be difficult.  Recommend to the patient that we proceed with surgical resection, a preop  PET scan ,pulmonary function studies updated and cardiac clearance for surgery and 6  min walk test have been done . Risks and options of surgical resection were discussed with the patient in detail She is agreeable with proceeding with plan later this week or bronchoscopy right video-assisted thoracoscopy with lung resection and lymph node dissection if appropriate.   The goals risks and alternatives of the planned surgical procedure Procedure(s): VIDEO BRONCHOSCOPY (N/A) VIDEO ASSISTED THORACOSCOPY (VATS)/LUNG RESECTION (Right)  have been discussed with the patient in detail. The risks of the procedure including death, infection, stroke, myocardial infarction, bleeding, blood transfusion have all been discussed specifically.  I have quoted [name] a 2 % of perioperative mortality and a complication rate as high as 30  %. The patient's  questions have been answered.Mysty Kielty [place] is willing  to proceed with the planned procedure.  She has  reminded of the precautions concerning prosthetic valve and endocarditis.    [name] MD      [address] [place] [address] Office [phone]   Beeper [phone]  11/29/2016 9:18 AM

## 2016-11-29 NOTE — Anesthesia Preprocedure Evaluation (Addendum)
Anesthesia Evaluation  Patient identified by MRN, date of birth, ID band Patient awake    Reviewed: Allergy & Precautions, NPO status , Patient's Chart, lab work & pertinent test results  Airway Mallampati: I  TM Distance: >3 FB Neck ROM: Full    Dental  (+) Edentulous Upper, Edentulous Lower   Pulmonary COPD, former smoker,    breath sounds clear to auscultation       Cardiovascular hypertension, Pt. on medications and Pt. on home beta blockers + CAD, + CABG, + Peripheral Vascular Disease and +CHF  negative cardio ROS   Rhythm:Regular Rate:Normal     Neuro/Psych  Headaches,  Neuromuscular disease    GI/Hepatic Neg liver ROS, GERD  ,  Endo/Other  negative endocrine ROS  Renal/GU Renal disease     Musculoskeletal negative musculoskeletal ROS (+)   Abdominal   Peds  Hematology negative hematology ROS (+)   Anesthesia Other Findings Day of surgery medications reviewed with the patient.  Reproductive/Obstetrics                            Anesthesia Physical Anesthesia Plan  ASA: III  Anesthesia Plan: General   Post-op Pain Management:    Induction: Intravenous  PONV Risk Score and Plan: 4 or greater and Ondansetron, Dexamethasone, Midazolam, Scopolamine patch - Pre-op and Treatment may vary due to age or medical condition  Airway Management Planned: Oral ETT  Additional Equipment: Arterial line and CVP  Intra-op Plan:   Post-operative Plan: Extubation in OR  Informed Consent: I have reviewed the patients History and Physical, chart, labs and discussed the procedure including the risks, benefits and alternatives for the proposed anesthesia with the patient or authorized representative who has indicated his/her understanding and acceptance.   Dental advisory given  Plan Discussed with: CRNA  Anesthesia Plan Comments:         Anesthesia Quick Evaluation

## 2016-11-29 NOTE — Anesthesia Procedure Notes (Signed)
Procedure Name: Intubation Date/Time: 11/29/2016 9:39 AM Performed by: Salli Quarry Bennye Nix Pre-anesthesia Checklist: Patient identified, Emergency Drugs available, Suction available and Patient being monitored Patient Re-evaluated:Patient Re-evaluated prior to induction Oxygen Delivery Method: Circle System Utilized Preoxygenation: Pre-oxygenation with 100% oxygen Induction Type: IV induction Ventilation: Mask ventilation without difficulty Laryngoscope Size: Mac and 4 Grade View: Grade I Endobronchial tube: Left, Double lumen EBT, EBT position confirmed by auscultation and EBT position confirmed by fiberoptic bronchoscope and 39 Fr Number of attempts: 1 Airway Equipment and Method: Stylet Placement Confirmation: ETT inserted through vocal cords under direct vision,  positive ETCO2 and breath sounds checked- equal and bilateral Tube secured with: Tape Dental Injury: Teeth and Oropharynx as per pre-operative assessment

## 2016-11-29 NOTE — Transfer of Care (Signed)
Immediate Anesthesia Transfer of Care Note  Patient: Leah [name] [facility]  Procedure(s) Performed: Procedure(s): VIDEO BRONCHOSCOPY (N/A) VIDEO ASSISTED THORACOSCOPY (VATS)/ RUL WEDGE RESECTION OF LESION/ NODE SAMPLING (Right)  Patient Location: PACU  Anesthesia Type:General  Level of Consciousness: awake, alert  and patient cooperative  Airway & Oxygen Therapy: Patient Spontanous Breathing and Patient connected to face mask oxygen  Post-op Assessment: Report given to RN and Post -op Vital signs reviewed and stable  Post vital signs: Reviewed and stable  Last Vitals:  Vitals:   11/29/16 1207 11/29/16 1209  BP: (!) 170/67 102/65  Pulse: 77 74  Resp: 18 18  Temp:    SpO2: 96% 97%    Last Pain:  Vitals:   11/29/16 0826  TempSrc: Oral      Patients Stated Pain Goal: 6 (44/92/01 0071)  Complications: No apparent anesthesia complications

## 2016-11-29 NOTE — Anesthesia Postprocedure Evaluation (Signed)
Anesthesia Post Note  Patient: Leah [name] [facility]  Procedure(s) Performed: Procedure(s) (LRB): VIDEO BRONCHOSCOPY (N/A) VIDEO ASSISTED THORACOSCOPY (VATS)/ RUL WEDGE RESECTION OF LESION/ NODE SAMPLING (Right)     Patient location during evaluation: PACU Anesthesia Type: General Level of consciousness: awake and alert Pain management: pain level controlled Vital Signs Assessment: post-procedure vital signs reviewed and stable Respiratory status: spontaneous breathing, nonlabored ventilation, respiratory function stable and patient connected to nasal cannula oxygen Cardiovascular status: blood pressure returned to baseline and stable Postop Assessment: no signs of nausea or vomiting Anesthetic complications: no    Last Vitals:  Vitals:   11/29/16 1407 11/29/16 1419  BP:  109/69  Pulse:  84  Resp: 16 (!) 9  Temp:    SpO2: 93% 91%    Last Pain:  Vitals:   11/29/16 1420  TempSrc:   PainSc: Asleep                 Effie Berkshire

## 2016-11-29 NOTE — Brief Op Note (Addendum)
      [address]       ,[place] [address]             [phone]      11/29/2016  11:43 AM  PATIENT:  Leah [name]  66 y.o. female  PRE-OPERATIVE DIAGNOSIS:  LUNG NODULE  POST-OPERATIVE DIAGNOSIS:  LUNG NODULE- atypical cells, likely inflammatory nodule   PROCEDURE:  Procedure(s): VIDEO BRONCHOSCOPY (N/A) VIDEO ASSISTED THORACOSCOPY (VATS)/ RUL WEDGE RESECTION OF LESION/ NODE SAMPLING (Right)  SURGEON:  Surgeon(s) and Role:    * [name], MD - Primary  PHYSICIAN ASSISTANT: WAYNE GOLD PA-C  ANESTHESIA:   general  EBL:  Total I/O In: -  Out: 485 [Urine:385; Blood:100]  BLOOD ADMINISTERED:none  DRAINS: 2 Chest Tube(s) in the RIGHT HEMITHORAX   LOCAL MEDICATIONS USED:  NONE  SPECIMEN:  Source of Specimen:  RUL WEDGE RESECTION, LN SAMPLES X2  DISPOSITION OF SPECIMEN:  PATHOLOGY  COUNTS:  YES  TOURNIQUET:  * No tourniquets in log *  DICTATION: .Other Dictation: Dictation Number [id number]  PLAN OF CARE: Admit to inpatient   PATIENT DISPOSITION:  PACU - hemodynamically stable.   Delay start of Pharmacological VTE agent (>24hrs) due to surgical blood loss or risk of bleeding: yes

## 2016-11-29 NOTE — H&P (Signed)
[facility] Record #[id number]  Date of Birth: [birth year]     Referring: No ref. provider found  Primary Care: Guadalupe Dawn, MD     Chief Complaint:   POST OP FOLLOW UP  12/05/2014   OPERATIVE REPORT  PREOPERATIVE DIAGNOSES:  1. Severe mitral insufficiency.  2. Aortic insufficiency.  3. Coronary occlusive disease.  POSTOPERATIVE DIAGNOSES:  1. Severe mitral insufficiency.  2. Aortic insufficiency.  3. Coronary occlusive disease.  4. Aortic insufficiency, mild.  PROCEDURE PERFORMED:  Mitral valve replacement with [facility]  pericardial tissue valve, model [id number] 29 mm, serial #[id number] and  coronary artery bypass grafting x1 with the left internal mammary to the  left anterior descending coronary artery.  Closure of left atrial  appendage.  SURGEON:  Lanelle Bal, MD.     History of Present Illness:      Patienthas been followed in office for  evaluation of a small right upper lobe lung nodule. This nodule is been present for several years but is slowly increasing in size, from 4-5 mm to 11 mm. Follow-up scan from May until now shows fairly stable size but definitely bigger than scans done in 2016. Patient has had recent  cardiac clearance , PET scan and PFT's     Patient notes she has no longer smoking. She has become more physically active but is limited by pain in especially her left leg and hip with ambulation. She denies chest pain or angina.                Past Medical History:    Diagnosis   Date    .   Anemia        .   CAD (coronary artery disease)            a. s/p LIMA-LAD in 11/2014    .   Carotid artery occlusion        .   Complication of anesthesia            slow to awaken x 1 maybe 2001    .   COPD (chronic obstructive pulmonary disease) ([place])        .   GERD (gastroesophageal reflux disease)            "sometimes" takes Advertising copywriter or drinks gingerale     .   Headache(784.0)        .   History of kidney stones        .   Hyperlipidemia        .   Hypertension        .   Leg pain        .   Peripheral vascular disease ([place])        .   Renal vascular disease   10/26/2014        bilateral stents placed     .   Severe mitral regurgitation            a. s/p MVR in 11/2014 with a pericardial tissue valve    .   Shortness of breath dyspnea        .   Tobacco abuse                       History    Smoking Status    .   Former Smoker    .   Packs/day:   0.50    .  Years:   35.00    .   Types:   Cigarettes    .   Quit date:   11/25/2016    Smokeless Tobacco    .   Never Used            Comment: has not started wellbutrin yet           History    Alcohol Use   No                   Allergies    Allergen   Reactions    .   Lisinopril   Swelling            Angioedema 06/10/11    .   Aspirin   Other (See Comments)            Upset stomach    .   Chantix [Varenicline]                insomnia    .   Penicillins   Hives            Has patient had a PCN reaction causing immediate rash, facial/tongue/throat swelling, SOB or lightheadedness with hypotension: Yes  Has patient had a PCN reaction causing severe rash involving mucus membranes or skin necrosis: No  Has patient had a PCN reaction that required hospitalization: No  Has patient had a PCN reaction occurring within the last 10 years: No  If all of the above answers are "NO", then may proceed with Cephalosporin use.                       Current Facility-Administered Medications    Medication   Dose   Route   Frequency   Provider   Last Rate   Last Dose    .   fentaNYL (SUBLIMAZE) 100 MCG/2ML injection                             .   lactated ringers infusion       Intravenous   Continuous   Effie Berkshire, MD   10 mL/hr at 11/29/16 [phone]        .   midazolam (VERSED) 2 MG/2ML injection                            .   vancomycin (VANCOCIN) IVPB 1000 mg/200 mL premix    1,000 mg   Intravenous   On Call to OR   [name], MD                           Physical Exam:  BP 127/73   Pulse 65   Temp 98 F (36.7 C) (Oral)   Resp 18   Ht 6\' 1"  (1.854 m)   Wt 177 lb (80.3 kg)   SpO2 99%   BMI 23.35 kg/m      Physical Exam   Vitals reviewed.  Constitutional: She is oriented to person, place, and time.   HENT:   Head: Normocephalic and atraumatic.   Mouth/Throat: No oropharyngeal exudate.   Neck: No JVD present. No tracheal deviation present. No thyromegaly present.   Cardiovascular: Normal rate, regular rhythm and intact distal pulses.  Exam reveals no gallop and no friction rub.    No murmur  heard.  Respiratory: No stridor. No respiratory distress. She has no wheezes. She has no rales. She exhibits no tenderness.   GI: She exhibits no distension. There is no tenderness. There is no rebound.  Musculoskeletal: She exhibits no edema, tenderness or deformity.  Lymphadenopathy:    She has no cervical adenopathy.  Neurological: She is alert and oriented to person, place, and time. No cranial nerve deficit.   Skin: Skin is warm and dry. She is not diaphoretic.  Psychiatric: She has a normal mood and affect. Her behavior is normal. Judgment and thought content normal.   chest incision well healed      Diagnostic Studies & Laboratory data:       Recent Radiology Findings:   Study Result        CLINICAL DATA:  66 year old female with greater than 30 pack-year  history of smoking, for initial lung cancer screening     EXAM:  CT CHEST WITHOUT CONTRAST LOW-DOSE FOR LUNG CANCER SCREENING     TECHNIQUE:  Multidetector CT  imaging of the chest was performed following the  standard protocol without IV contrast.     COMPARISON:  Multiple prior CTs, most recently 09/21/2015     FINDINGS:  Cardiovascular: The heart is normal in size. No pericardial  effusion. Prosthetic mitral valve.     Three vessel coronary atherosclerosis.     No evidence of thoracic aortic aneurysm. Atherosclerotic  calcifications of the aortic arch.     Mediastinum/Nodes: No suspicious mediastinal lymphadenopathy.     Visualized thyroid is unremarkable.     Lungs/Pleura: 9.8 mm (volumetric mean) spiculated nodule in the  anterior right upper lobe (series 3/ image 65). This has  significantly progressed from the prior studies.     Additional scattered small bilateral pulmonary nodules measuring up  to 3.8 mm.     3.2 mm calcified granuloma in the right upper lobe.     Underlying mild centrilobular and paraseptal emphysematous changes,  upper lobe predominant.     Mild biapical pleural-parenchymal scarring.     Mild linear scarring/ atelectasis in the left lower lobe. No focal  consolidation.     No pleural effusion or pneumothorax.     Upper Abdomen: Visualized upper abdomen is unremarkable.     Musculoskeletal: Median sternotomy.     IMPRESSION:  9.8 mm spiculated nodule in the anterior right upper lobe. Lung-RADS  Category 4A, suspicious. Follow up low-dose chest CT without  contrast in 3 months (please use the following order, "CT CHEST LCS  NODULE FOLLOW-UP W/O CM") is recommended. Alternatively, PET may be  considered.        Electronically Signed    By: Julian Hy M.D.    On: 07/29/2016 14:24             Ct Chest Wo Contrast     09/21/2015  CLINICAL DATA:  Follow-up pulmonary nodule.  Current smoker. EXAM: CT CHEST WITHOUT CONTRAST TECHNIQUE: Multidetector CT imaging of the chest was performed following the standard protocol without IV  contrast. COMPARISON:  06/08/2015 chest CT. FINDINGS: Mediastinum/Nodes: Mild cardiomegaly. No significant pericardial fluid/thickening. Left anterior, left circumflex and right coronary atherosclerosis status post CABG with left internal mammary bypass graft. Mitral valve prosthesis is in place. Atherosclerotic nonaneurysmal thoracic aorta. Normal caliber pulmonary arteries. No discrete thyroid nodules. Unremarkable esophagus. No pathologically enlarged axillary, mediastinal or gross hilar lymph nodes, noting limited sensitivity for the detection of hilar adenopathy on this noncontrast study. Lungs/Pleura: No pneumothorax. No  pleural effusion. Mild centrilobular emphysema and mild diffuse bronchial wall thickening. Apical right upper lobe 3 mm solid pulmonary nodule (series 4/ image 31) has continued to decrease in size, previously 5 mm on 06/08/2015 using similar measurement technique. Additional scattered 2-3 mm centrilobular pulmonary nodules in the upper lungs bilaterally are not appreciably changed and are considered benign. No acute consolidative airspace disease or new significant pulmonary nodules. Stable parenchymal band in the anterior left lower lobe. Upper abdomen: Partially visualized bilateral proximal renal artery stents. Musculoskeletal: No aggressive appearing focal osseous lesions. Sternotomy wires appear aligned and intact. IMPRESSION: 1. Continued reduction in the size of the apical right upper lobe pulmonary nodule, now 3 mm, consistent with resolving inflammatory nodule. 2. Mild centrilobular emphysema and mild diffuse bronchial wall thickening, suggesting COPD. No acute pulmonary disease. 3. Three-vessel coronary atherosclerosis status post CABG. Stable mild cardiomegaly. Electronically Signed   By: Ilona Sorrel M.D.   On: 09/21/2015 11:10      I have independently reviewed the above radiology studies  and reviewed the findings with the patient.        Ct Super D Chest Wo  Contrast     06/08/2015  CLINICAL DATA:  Followup of right apical pulmonary nodule. Prior CABG and valve replacement. Ex-smoker. EXAM: CT CHEST WITHOUT CONTRAST TECHNIQUE: Multidetector CT imaging of the chest was performed using thin slice collimation for electromagnetic bronchoscopy planning purposes, without intravenous contrast. COMPARISON:  03/09/2015 and 10/27/2014. FINDINGS: Mediastinum/Lymph Nodes: Aortic and branch vessel atherosclerosis. Moderate cardiomegaly with mitral valve repair. Prior median sternotomy. No mediastinal or definite hilar adenopathy, given limitations of unenhanced CT. Lungs/Pleura: No pleural fluid.  Mild centrilobular emphysema. The right apical complex nodule is less well-defined today. Solid component measures 4 mm on image 17/series 4 versus 5 mm on the prior. nodule in total measures 8 mm today versus 12 mm on the prior. On sagittal reformats today, 4 mm soft tissue component on image 43 versus 5 mm on the prior. 9 mm overall lesion size today versus 11 mm on the prior (when remeasured). Scarring at the left lung base. Upper abdomen: Normal imaged portions of the liver, spleen, stomach, pancreas, gallbladder, adrenal glands, kidneys. Bilateral renal artery stents. Musculoskeletal: No acute osseous abnormality. IMPRESSION: 1. Decreased size/definition of right apical pulmonary nodule. This suggests resolving infectious or inflammatory etiology. Recommend imaging surveillance with at least 1 more 3- 6 month follow-up. 2.  No acute process in the chest. 3. Cardiomegaly with prior median sternotomy for CABG and mitral valve repair. Electronically Signed   By: Abigail Miyamoto M.D.   On: 06/08/2015 12:37         Ct Chest Wo Contrast     03/09/2015  CLINICAL DATA:  Followup pulmonary nodule. EXAM: CT CHEST WITHOUT CONTRAST TECHNIQUE: Multidetector CT imaging of the chest was performed following the standard protocol without IV contrast. COMPARISON:  Chest CT 10/27/2014 and  08/25/2014 FINDINGS: Mediastinum/Nodes: No breast masses, supraclavicular or axillary lymphadenopathy. The thyroid gland is grossly normal. The heart is normal in size. No pericardial effusion. Stable tortuosity, ectasia and calcifications involving the thoracic aorta. Prosthetic aortic valve is noted along with coronary artery bypass grafts. No mediastinal or hilar mass or adenopathy. The esophagus is grossly normal. Lungs/Pleura: The lungs are clear of acute process. Stable emphysematous changes. Again demonstrated is a right upper lobe pulmonary lesion which is partly solid and partly cystic. The solid component measures slightly larger on the coronal images. It measures 7.5 mm on image number  53 of series 601. It previously measured 6 mm. The cystic component has a thicker ram than on the prior examinations. The largest measurement on the axial images of 11.5 mm and was previously 10.5 mm. Could not exclude a slow growing neoplasm. PET-CT may be helpful for further evaluation. It would not be an reasonable to continue to follow this with CT with a repeat CT scan in 4-6 months. No new regions.  No acute pulmonary findings.  No pleural effusion. Upper abdomen: No significant upper abdominal findings. Musculoskeletal: No significant bony findings. IMPRESSION: Slight interval increase in size of the right apical lung lesion as described above. Continued CT follow-up (4-6 months) or PET-CT are both reasonable. No new pulmonary lesions or acute pulmonary findings. Stable emphysematous changes. No mediastinal or hilar mass or adenopathy. Electronically Signed   By: Marijo Sanes M.D.   On: 03/09/2015 13:30                CLINICAL DATA:  Re-evaluate right upper lobe pulmonary nodule, prior  to possible valve surgery. Initial encounter.     EXAM:  CT CHEST WITHOUT CONTRAST     TECHNIQUE:  Multidetector CT imaging of the chest was performed following the  standard protocol without IV  contrast.     COMPARISON:  CT of the chest performed 08/25/2014     FINDINGS:  The small slightly spiculated nodule at the right lung apex is again  seen; there is a small associated cystic component which appears to  have increased mildly in size, while the solid component has  decreased slightly in size, measuring approximately 5 mm. Given the  slight apparent interval decrease in size of the solid component  from the recent prior study, this is thought more likely to be  postinfectious in nature, though malignancy cannot be entirely  excluded.     No additional pulmonary nodules are seen. The lungs are otherwise  clear, aside from mild scarring or atelectasis at the left lung  base. No pleural effusion or pneumothorax is seen.     Scattered coronary artery calcifications are noted. The mediastinum  is otherwise unremarkable. No mediastinal lymphadenopathy is seen.  No pericardial effusion is identified. The great vessels are grossly  unremarkable, aside from scattered calcification. Mild calcification  is noted along the thoracic aorta. The visualized portions of the  thyroid gland are unremarkable. No axillary lymphadenopathy is  appreciated.     No acute osseous abnormalities are identified.     IMPRESSION:  1. Small slightly spiculated nodule at the right lung apex is again  seen. It has a small cystic component which appears to have  increased mildly in size, while the solid component has decreased  slightly in size, measuring approximately 5 mm. Given the slight  apparent interval decrease in size of the solid component from the  recent prior CT, this is thought more likely to be postinfectious in  nature, though malignancy cannot be entirely excluded. As this  remains too small to further characterize, would recommend strict  follow-up CT of the chest in 3-6 months, to ensure stability.  2. Mild scarring or atelectasis at the left  lung base.  3. Scattered coronary artery calcifications seen.        Electronically Signed    By: Garald Balding M.D.    On: 10/27/2014 19:40     Study Result        CLINICAL DATA:  Initial treatment strategy for right upper lobe lung  nodule.     EXAM:  NUCLEAR MEDICINE PET SKULL BASE TO THIGH     TECHNIQUE:  10.0 mCi F-18 FDG was injected intravenously. Full-ring PET imaging  was performed from the skull base to thigh after the radiotracer. CT  data was obtained and used for attenuation correction and anatomic  localization.     FASTING BLOOD GLUCOSE:  Value: 113 mg/dl     COMPARISON:  Multiple exams, including chest CT from 11/12/2016     FINDINGS:  NECK     No hypermetabolic lymph nodes in the neck. Atherosclerotic  calcification of the carotid arteries. Symmetric glottic activity is  thought to likely be physiologic.     CHEST     1.0 by 0.7 cm right upper lobe pulmonary nodule on image 18 series  7, maximum SUV 3.4, suspicious for malignancy.     A small right axillary lymph node with fatty hilum, total nodes size  10 mm but with the fatty hilum making up a large portion of this  measurement has a maximum SUV of 2.1 and is likely benign.  Background mediastinal blood pool activity 2.9.     No hypermetabolic mediastinal or hilar adenopathy.     Coronary, aortic arch, and branch vessel atherosclerotic vascular  disease. Mild cardiomegaly.     ABDOMEN/PELVIS     No abnormal hypermetabolic activity within the liver, pancreas,  adrenal glands, or spleen. No hypermetabolic lymph nodes in the  abdomen or pelvis.     Aortoiliac atherosclerotic vascular disease. Abdominal aortic  ectasia at 2.9 cm diameter.     Dependent density in the gallbladder potentially from sludge or  gallstones. Suspected 2 mm right kidney lower pole nonobstructive  renal calculus. Suspected pelvic floor laxity.      SKELETON     No focal hypermetabolic activity to suggest skeletal metastasis.     IMPRESSION:  1. Hypermetabolic 1.0 cm right upper lobe pulmonary nodule  suspicious for malignancy. No hypermetabolic adenopathy or  metastatic disease. Assuming non-small cell lung cancer the  appearance is most compatible with T1a N0 M0 disease (stage 1A).  2. Aortic Atherosclerosis (ICD10-I70.0). Coronary atherosclerosis  with mild cardiomegaly.  3. Mild nonobstructive right nephrolithiasis.  4. Sludge or possibly gallstones in the gallbladder.        Electronically Signed    By: Van Clines M.D.    On: 11/21/2016 14:26                   Recent Lab Findings:  Recent Labs  Notes recorded by Erma Heritage, PA-C on 11/20/2016 at 5:32 PM EDT Please let the patient know her stress test showed evidence of her prior infarction but no ischemia that would be concerning for a potential blockage. Her EF was reduced, therefore would recommend a limited echo to reassess EF as this can sometimes be read as falsely low on a stress test (EF was 45-50% by echo in 2016). She does not need to have the echocardiogram performed prior to surgery as this can be scheduled at a later date. Is cleared from a cardiac perspective to proceed with surgery.              Vitals        Height    Weight    BMI (Calculated)     6\' 1"  (1.854 m)   177 lb (80.3 kg)   23.36     Study Highlights       .Nuclear stress EF: 31%. The left ventricular ejection fraction is moderately decreased (30-44%).   Marland KitchenDefect 1: There is a medium defect of moderate severity present in the apical anterior, apical septal, apical inferior and apex location.   .Findings consistent with prior apical myocardial infarction.   .This is a high risk study based on severely reduced LV function . There is no evidence of ischemia           PFT"S  fev1 2.11 76%     dlco 13.88  39%      Interpretation: The FVC, FEV1 and FEV1/FVC ratio are reduced, but the FEF25-75% is within normal limits. The airway  resistance is normal. Lung volumes are within normal limits. Following administration of bronchodilators, there is no  significant response. The reduced diffusing capacity indicates a severe loss of functional alveolar capillary surface.  However, the diffusing capacity was not corrected for the patient's hemoglobin.  Conclusions: Although there is airway obstruction and a diffusion defect suggesting emphysema, the absence of  overinflation is inconsistent with that diagnosis. In view of the severity of the diffusion defect, studies with exercise would  be helpful to evaluate the presence of hypoxemia.  Pulmonary Function Diagnosis:  Mild Obstructive Airways Disease  Severe Diffusion Defect         6 min walk test   6 Minute Walk Test Results     Patient:            Brieanna Nau [facility]  Date:                11/27/2016        Supplemental O2 during test?            none                                            Baseline                                 End     Time                            1658  1703  Heartrate                     70                                            87  Dyspnea                      no                                            no  Fatigue                        no                                             Left leg pain  O2 sat                          98%                                         99%  Blood pressure            122/80                                     128/87        Patient ambulated at a fast pace for a total distance of 540 feet with 1 stop.     Ambulation was limited primarily due to left leg and hip pain.     Overall the test was tolerated well        ECHO: 03/10/2015  Study Conclusions  - Left ventricle: The cavity size was normal. Wall thickness was   normal. Systolic function was mildly reduced. The estimated   ejection fraction was in the range of 45% to 50%. There is   hypokinesis of the basal-mid septal myocardium. - Aortic valve: There was moderate regurgitation. - Mitral valve: A bioprosthesis was present. - Left atrium: The atrium was mildly dilated.  Impressions:  - Basal septal hypokinesis with mildly reduced LV function; aortic   valve not well visualized but probable moderate AI (2+); s/p MVR   with no obvious MR; trace TR.  Transthoracic echocardiography.  M-mode, complete 2D, spectral Doppler, and color Doppler.  Birthdate:  Patient birthdate: [birth year].  Age:  Patient is 66 yr old.  Sex:  Gender: female. BMI: 23.9 kg/m^2.  Blood pressure:     106/64  Patient status: Outpatient.  Study date:  Study date: 03/10/2015. Study time: 02:26 PM.  Location:  [facility]  -------------------------------------------------------------------  ------------------------------------------------------------------- Left ventricle:  The cavity size was normal. Wall thickness was normal. Systolic function was mildly reduced. The estimated ejection fraction was in the range of 45% to 50%.  Regional wall motion abnormalities:  There is hypokinesis of the basal-mid  septal myocardium.  ------------------------------------------------------------------- Aortic valve:  Poorly visualized.  Doppler:  Transvalvular  velocity was within the normal range. There was no stenosis. There was moderate regurgitation.  ------------------------------------------------------------------- Aorta:  Aortic root: The aortic root was normal in size.  ------------------------------------------------------------------- Mitral valve:  A bioprosthesis was present.  Doppler:  There was no regurgitation.    Peak gradient (D): 8 mm Hg.  ------------------------------------------------------------------- Left atrium:  The atrium was mildly dilated.  ------------------------------------------------------------------- Right ventricle:  The cavity size was normal. Systolic function was normal.  ------------------------------------------------------------------- Pulmonic valve:    Doppler:  Transvalvular velocity was within the normal range. There was no evidence for stenosis.  ------------------------------------------------------------------- Tricuspid valve:   Structurally normal valve.    Doppler: Transvalvular velocity was within the normal range. There was trivial regurgitation.  ------------------------------------------------------------------- Pulmonary artery:   Systolic pressure was within the normal range.   ------------------------------------------------------------------- Right atrium:  The atrium was normal in size.  ------------------------------------------------------------------- Pericardium:  There was no pericardial effusion.  ------------------------------------------------------------------- Systemic veins: Inferior vena cava: The vessel was normal in size.       Assessment / Plan:  In June 2016 she was noted to have a very small right upper lobe lung nodule preoperatively a repeat scan was done in August, that showed the solid component to decrease in size.  On previous  CT of the chest  right upper lobe mass Decreased size/definition of right apical pulmonary nodule. This suggests  resolving infectious or inflammatory etiology     A low dose screening CT was done in May of this year, suggesting that the nodule was now increasing in size , follow up scan scan  shows persistent of the right upper lobe lung lesion now larger than it was in 2016.      With the small size and deep in the upper lobe CT directed needle biopsy may be difficult and or navigation bronchoscopy will be difficult.     Recommend to the patient that we proceed with surgical resection, a preop  PET scan ,pulmonary function studies updated and cardiac clearance for surgery and 6 min walk test have been done .  Risks and options of surgical resection were discussed with the patient in detail She is agreeable with proceeding with plan later this week or bronchoscopy right video-assisted thoracoscopy with lung resection and lymph node dissection if appropriate.      The goals risks and alternatives of the planned surgical procedure Procedure(s):  VIDEO BRONCHOSCOPY (N/A)  VIDEO ASSISTED THORACOSCOPY (VATS)/LUNG RESECTION (Right)  have been discussed with the patient in detail. The risks of the procedure including death, infection, stroke, myocardial infarction, bleeding, blood transfusion have all been discussed specifically.  I have quoted [name] a 2 % of perioperative mortality and a complication rate as high as 30  %. The patient's questions have been answered.Lovenia Debruler [place] is willing  to proceed with the planned procedure.     She has  reminded of the precautions concerning prosthetic valve and endocarditis.        [name] MD     untitled image      [address]  [place], [address]  Office [phone]                 Murlean Hark [phone]     11/29/2016 9:18 AM                  Electronically signed by [name], MD at 11/29/2016  9:18 AM

## 2016-11-29 NOTE — Anesthesia Procedure Notes (Signed)
Arterial Line Insertion Start/End9/09/2016 9:23 AM Performed by: [name], CRNA  Patient location: Pre-op. Lidocaine 1% used for infiltration and patient sedated Left, radial was placed Catheter size: 20 G Hand hygiene performed  and maximum sterile barriers used   Attempts: 2 Procedure performed without using ultrasound guided technique. Following insertion, dressing applied and Biopatch. Post procedure assessment: normal  Patient tolerated the procedure well with no immediate complications.

## 2016-11-29 NOTE — Anesthesia Procedure Notes (Signed)
Central Venous Catheter Insertion Performed by: Effie Berkshire, anesthesiologist Start/End9/09/2016 9:15 AM, 11/29/2016 9:25 AM Patient location: Pre-op. Preanesthetic checklist: patient identified, IV checked, site marked, risks and benefits discussed, surgical consent, monitors and equipment checked, pre-op evaluation, timeout performed and anesthesia consent Position: Trendelenburg Lidocaine 1% used for infiltration and patient sedated Hand hygiene performed , maximum sterile barriers used  and Seldinger technique used Catheter size: 8 Fr Total catheter length 16. Central line was placed.Double lumen Procedure performed using ultrasound guided technique. Ultrasound Notes:anatomy identified, needle tip was noted to be adjacent to the nerve/plexus identified, no ultrasound evidence of intravascular and/or intraneural injection and image(s) printed for medical record Attempts: 1 Following insertion, dressing applied, line sutured and Biopatch. Post procedure assessment: blood return through all ports  Patient tolerated the procedure well with no immediate complications.

## 2016-11-29 NOTE — Progress Notes (Signed)
      [address]       [place] [address]             [phone]      S/p wedge resection  BP (!) 189/74   Pulse 95   Temp 98.6 F (37 C) (Axillary)   Resp 13   Ht 6\' 1"  (1.854 m)   Wt 188 lb 7.9 oz (85.5 kg)   SpO2 94%   BMI 24.87 kg/m    Intake/Output Summary (Last 24 hours) at 11/29/16 1848 Last data filed at 11/29/16 1800  Gross per 24 hour  Intake             1870 ml  Output             1043 ml  Net              827 ml    Doing well early postop  Remo Lipps C. Roxan Hockey, MD [facility] [phone]

## 2016-11-30 ENCOUNTER — Encounter (HOSPITAL_COMMUNITY): Payer: Self-pay | Admitting: Cardiothoracic Surgery

## 2016-11-30 ENCOUNTER — Inpatient Hospital Stay (HOSPITAL_COMMUNITY): Payer: Medicaid Other

## 2016-11-30 LAB — BASIC METABOLIC PANEL
ANION GAP: 10 (ref 5–15)
Anion gap: 8 (ref 5–15)
BUN: 7 mg/dL (ref 6–20)
BUN: 7 mg/dL (ref 6–20)
CHLORIDE: 101 mmol/L (ref 101–111)
CO2: 25 mmol/L (ref 22–32)
CO2: 26 mmol/L (ref 22–32)
Calcium: 9.1 mg/dL (ref 8.9–10.3)
Calcium: 9.2 mg/dL (ref 8.9–10.3)
Chloride: 102 mmol/L (ref 101–111)
Creatinine, Ser: 0.79 mg/dL (ref 0.44–1.00)
Creatinine, Ser: 0.7 mg/dL (ref 0.44–1.00)
GFR calc Af Amer: >60 mL/min (ref >60)
GFR calc Af Amer: >60 mL/min (ref >60)
GFR calc non Af Amer: >60 mL/min (ref >60)
Glucose, Bld: 166 mg/dL — ABNORMAL HIGH (ref 65–99)
Glucose, Bld: 127 mg/dL — ABNORMAL HIGH (ref 65–99)
POTASSIUM: 2.8 mmol/L — AB (ref 3.5–5.1)
Potassium: 3.4 mmol/L — ABNORMAL LOW (ref 3.5–5.1)
SODIUM: 136 mmol/L (ref 135–145)
Sodium: 136 mmol/L (ref 135–145)

## 2016-11-30 LAB — CBC
HEMATOCRIT: 38.8 % (ref 36.0–46.0)
HEMOGLOBIN: 12.7 g/dL (ref 12.0–15.0)
MCH: 25.8 pg — AB (ref 26.0–34.0)
MCHC: 32.7 g/dL (ref 30.0–36.0)
MCV: 78.9 fL (ref 78.0–100.0)
Platelets: 130 10*3/uL — ABNORMAL LOW (ref 150–400)
RBC: 4.92 MIL/uL (ref 3.87–5.11)
RDW: 15.2 % (ref 11.5–15.5)
WBC: 16.3 10*3/uL — ABNORMAL HIGH (ref 4.0–10.5)

## 2016-11-30 LAB — BLOOD GAS, ARTERIAL
Acid-Base Excess: 4 mmol/L — ABNORMAL HIGH (ref 0.0–2.0)
Bicarbonate: 28 mmol/L (ref 20.0–28.0)
DRAWN BY: 414221
O2 Saturation: 95.5 %
PCO2 ART: 42.3 mmHg (ref 32.0–48.0)
PH ART: 7.436 (ref 7.350–7.450)
Patient temperature: 98.6
pO2, Arterial: 76.5 mmHg — ABNORMAL LOW (ref 83.0–108.0)

## 2016-11-30 MED ORDER — TRAZODONE HCL 100 MG PO TABS
100.0000 mg | ORAL_TABLET | Freq: Every evening | ORAL | Status: DC | PRN
Start: 2016-11-30 — End: 2016-12-03
  Administered 2016-12-01 – 2016-12-02 (×2): 100 mg via ORAL
  Filled 2016-11-30: qty 2
  Filled 2016-11-30: qty 1

## 2016-11-30 MED ORDER — CHLORHEXIDINE GLUCONATE CLOTH 2 % EX PADS
6.0000 | MEDICATED_PAD | Freq: Every day | CUTANEOUS | Status: DC
  Administered 2016-11-30 – 2016-12-02 (×3): 6 via TOPICAL

## 2016-11-30 MED ORDER — POTASSIUM CHLORIDE 10 MEQ/50ML IV SOLN
10.0000 meq | INTRAVENOUS | Status: AC
  Administered 2016-11-30 (×3): 10 meq via INTRAVENOUS
  Filled 2016-11-30: qty 50

## 2016-11-30 MED ORDER — CYCLOBENZAPRINE HCL 10 MG PO TABS
10.0000 mg | ORAL_TABLET | Freq: Three times a day (TID) | ORAL | Status: DC | PRN
Start: 2016-11-30 — End: 2016-12-03
  Administered 2016-12-01 – 2016-12-02 (×3): 10 mg via ORAL
  Filled 2016-11-30 (×3): qty 1

## 2016-11-30 MED ORDER — SODIUM CHLORIDE 0.9% FLUSH
10.0000 mL | Freq: Two times a day (BID) | INTRAVENOUS | Status: DC
  Administered 2016-11-30 – 2016-12-03 (×3): 10 mL

## 2016-11-30 MED ORDER — POTASSIUM CHLORIDE 10 MEQ/50ML IV SOLN: 10.0000 meq | INTRAVENOUS | Status: DC | PRN

## 2016-11-30 MED ORDER — SODIUM CHLORIDE 0.9% FLUSH
10.0000 mL | INTRAVENOUS | Status: DC | PRN
  Administered 2016-12-01: 10 mL
  Filled 2016-11-30: qty 40

## 2016-11-30 NOTE — Progress Notes (Signed)
      [address]       [place], [address]             [phone]      Sitting up in chair  BP 126/80   Pulse 82   Temp 98.1 F (36.7 C) (Oral)   Resp 17   Ht 6\' 1"  (1.854 m)   Wt 176 lb 9.4 oz (80.1 kg)   SpO2 100%   BMI 23.30 kg/m    Intake/Output Summary (Last 24 hours) at 11/30/16 1923 Last data filed at 11/30/16 1840  Gross per 24 hour  Intake          2837.87 ml  Output             3730 ml  Net          -892.13 ml   Doing well POD # 1  Darshay Deupree C. Roxan Hockey, MD [facility] [phone]

## 2016-11-30 NOTE — Plan of Care (Signed)
Problem: Activity: Goal: Risk for activity intolerance will decrease Outcome: Progressing Pt ambulated, up in chair

## 2016-11-30 NOTE — Progress Notes (Signed)
1 Day Post-Op Procedure(s) (LRB): VIDEO BRONCHOSCOPY (N/A) VIDEO ASSISTED THORACOSCOPY (VATS)/ RUL WEDGE RESECTION OF LESION/ NODE SAMPLING (Right) Subjective: Some incisional discomfort, denies nausea  Objective: Vital signs in last 24 hours: Temp:  [97 F (36.1 C)-98.6 F (37 C)] 98.3 F (36.8 C) (09/08 0741) Pulse Rate:  [25-96] 85 (09/08 0700) Cardiac Rhythm: Normal sinus rhythm (09/08 0000) Resp:  [9-27] 16 (09/08 0700) BP: (102-193)/(55-105) 156/84 (09/08 0700) SpO2:  [86 %-100 %] 100 % (09/08 0700) Arterial Line BP: (93-156)/(53-85) 156/65 (09/08 0700) Weight:  [176 lb 9.4 oz (80.1 kg)-188 lb 7.9 oz (85.5 kg)] 176 lb 9.4 oz (80.1 kg) (09/08 0530)  Hemodynamic parameters for last 24 hours:    Intake/Output from previous day: 09/07 0701 - 09/08 0700 In: 3182.9 [P.O.:120; I.V.:2712.9; IV Piggyback:350] Out: 5329 [Urine:3785; Blood:100; Chest Tube:188] Intake/Output this shift: No intake/output data recorded.  General appearance: alert, cooperative and no distress Neurologic: intact Heart: regular rate and rhythm Lungs: clear to auscultation bilaterally Abdomen: normal findings: soft, non-tender  Lab Results:  Recent Labs  11/29/16 0647 11/30/16 0222  WBC 12.0* 16.3*  HGB 12.9 12.7  HCT 38.8 38.8  PLT 118* 130*   BMET:  Recent Labs  11/29/16 0647 11/30/16 0222  NA 139 136  K 3.2* 2.8*  CL 105 101  CO2 21* 25  GLUCOSE 103* 166*  BUN 10 7  CREATININE 0.90 0.79  CALCIUM 9.3 9.1    PT/INR:  Recent Labs  11/29/16 0647  LABPROT 13.4  INR 1.03   ABG    Component Value Date/Time   PHART 7.436 11/30/2016 0406   HCO3 28.0 11/30/2016 0406   TCO2 23 12/06/2014 1632   ACIDBASEDEF 0.6 11/29/2016 0647   O2SAT 95.5 11/30/2016 0406   CBG (last 3)  No results for input(s): GLUCAP in the last 72 hours.  Assessment/Plan: S/P Procedure(s) (LRB): VIDEO BRONCHOSCOPY (N/A) VIDEO ASSISTED THORACOSCOPY (VATS)/ RUL WEDGE RESECTION OF LESION/ NODE SAMPLING  (Right) -CV- in SR with PVCs  Hypertension- on norvasc and metoprolol  RESP- IS  No air leak- CT to water seal  RENAL- creatinine Ok  Advance diet  SCD + enoxaparin for DVT prophylaxis   LOS: 1 day    Melrose Nakayama 11/30/2016

## 2016-12-01 ENCOUNTER — Inpatient Hospital Stay (HOSPITAL_COMMUNITY): Payer: Medicaid Other

## 2016-12-01 LAB — COMPREHENSIVE METABOLIC PANEL
ALBUMIN: 3.1 g/dL — AB (ref 3.5–5.0)
ALK PHOS: 114 U/L (ref 38–126)
ALT: 13 U/L — AB (ref 14–54)
ANION GAP: 8 (ref 5–15)
AST: 18 U/L (ref 15–41)
BUN: 10 mg/dL (ref 6–20)
CO2: 27 mmol/L (ref 22–32)
CREATININE: 0.88 mg/dL (ref 0.44–1.00)
Calcium: 8.7 mg/dL — ABNORMAL LOW (ref 8.9–10.3)
Chloride: 102 mmol/L (ref 101–111)
GFR calc non Af Amer: >60 mL/min (ref >60)
GLUCOSE: 100 mg/dL — AB (ref 65–99)
Potassium: 3.4 mmol/L — ABNORMAL LOW (ref 3.5–5.1)
SODIUM: 137 mmol/L (ref 135–145)
Total Bilirubin: 0.7 mg/dL (ref 0.3–1.2)
Total Protein: 6.1 g/dL — ABNORMAL LOW (ref 6.5–8.1)

## 2016-12-01 LAB — CBC
HEMATOCRIT: 37.1 % (ref 36.0–46.0)
HEMOGLOBIN: 12 g/dL (ref 12.0–15.0)
MCH: 25.9 pg — ABNORMAL LOW (ref 26.0–34.0)
MCHC: 32.3 g/dL (ref 30.0–36.0)
MCV: 80.1 fL (ref 78.0–100.0)
Platelets: 114 10*3/uL — ABNORMAL LOW (ref 150–400)
RBC: 4.63 MIL/uL (ref 3.87–5.11)
RDW: 15 % (ref 11.5–15.5)
WBC: 16.5 10*3/uL — AB (ref 4.0–10.5)

## 2016-12-01 MED ORDER — POTASSIUM CHLORIDE 10 MEQ/50ML IV SOLN
10.0000 meq | INTRAVENOUS | Status: AC
  Administered 2016-12-01 (×2): 10 meq via INTRAVENOUS
  Filled 2016-12-01: qty 50

## 2016-12-01 MED ORDER — POTASSIUM CHLORIDE 10 MEQ/50ML IV SOLN
10.0000 meq | INTRAVENOUS | Status: AC | PRN
  Administered 2016-12-01 (×3): 10 meq via INTRAVENOUS
  Filled 2016-12-01 (×3): qty 50

## 2016-12-01 NOTE — Progress Notes (Signed)
2 Days Post-Op Procedure(s) (LRB): VIDEO BRONCHOSCOPY (N/A) VIDEO ASSISTED THORACOSCOPY (VATS)/ RUL WEDGE RESECTION OF LESION/ NODE SAMPLING (Right) Subjective: Some incisional pain  Objective: Vital signs in last 24 hours: Temp:  [98.1 F (36.7 C)-98.5 F (36.9 C)] 98.5 F (36.9 C) (09/09 0725) Pulse Rate:  [62-87] 77 (09/09 0700) Cardiac Rhythm: Normal sinus rhythm (09/09 0600) Resp:  [13-24] 17 (09/09 0700) BP: (113-178)/(60-95) 164/73 (09/09 0700) SpO2:  [94 %-100 %] 96 % (09/09 0700) Arterial Line BP: (133)/(62) 133/62 (09/08 0900) FiO2 (%):  [95 %-100 %] 100 % (09/08 1600)  Hemodynamic parameters for last 24 hours:    Intake/Output from previous day: 09/08 0701 - 09/09 0700 In: 2620 [P.O.:1200; I.V.:1220; IV Piggyback:200] Out: 1021 [Urine:1775; Chest Tube:40] Intake/Output this shift: Total I/O In: 50 [IV Piggyback:50] Out: -   General appearance: alert, cooperative and no distress Neurologic: intact Heart: regular rate and rhythm Lungs: diminished breath sounds bibasilar Abdomen: normal findings: soft, non-tender no air leak  Lab Results:  Recent Labs  11/30/16 0222 12/01/16 0338  WBC 16.3* 16.5*  HGB 12.7 12.0  HCT 38.8 37.1  PLT 130* 114*   BMET:  Recent Labs  11/30/16 0809 12/01/16 0338  NA 136 137  K 3.4* 3.4*  CL 102 102  CO2 26 27  GLUCOSE 127* 100*  BUN 7 10  CREATININE 0.70 0.88  CALCIUM 9.2 8.7*    PT/INR:  Recent Labs  11/29/16 0647  LABPROT 13.4  INR 1.03   ABG    Component Value Date/Time   PHART 7.436 11/30/2016 0406   HCO3 28.0 11/30/2016 0406   TCO2 23 12/06/2014 1632   ACIDBASEDEF 0.6 11/29/2016 0647   O2SAT 95.5 11/30/2016 0406   CBG (last 3)  No results for input(s): GLUCAP in the last 72 hours.  Assessment/Plan: S/P Procedure(s) (LRB): VIDEO BRONCHOSCOPY (N/A) VIDEO ASSISTED THORACOSCOPY (VATS)/ RUL WEDGE RESECTION OF LESION/ NODE SAMPLING (Right) -Overall doing well Will dc posterior CT today Continue  IS hypokalemia- supplement K Continue ambulation Transfer to 4e   LOS: 2 days    Melrose Nakayama 12/01/2016

## 2016-12-02 ENCOUNTER — Inpatient Hospital Stay (HOSPITAL_COMMUNITY): Payer: Medicaid Other

## 2016-12-02 LAB — BASIC METABOLIC PANEL
Anion gap: 6 (ref 5–15)
BUN: 9 mg/dL (ref 6–20)
CALCIUM: 9.2 mg/dL (ref 8.9–10.3)
CHLORIDE: 106 mmol/L (ref 101–111)
CO2: 27 mmol/L (ref 22–32)
CREATININE: 0.83 mg/dL (ref 0.44–1.00)
GFR calc Af Amer: >60 mL/min (ref >60)
GFR calc non Af Amer: >60 mL/min (ref >60)
GLUCOSE: 100 mg/dL — AB (ref 65–99)
Potassium: 3.6 mmol/L (ref 3.5–5.1)
Sodium: 139 mmol/L (ref 135–145)

## 2016-12-02 LAB — CBC
HEMATOCRIT: 37.2 % (ref 36.0–46.0)
Hemoglobin: 12 g/dL (ref 12.0–15.0)
MCH: 25.8 pg — ABNORMAL LOW (ref 26.0–34.0)
MCHC: 32.3 g/dL (ref 30.0–36.0)
MCV: 79.8 fL (ref 78.0–100.0)
Platelets: 123 10*3/uL — ABNORMAL LOW (ref 150–400)
RBC: 4.66 MIL/uL (ref 3.87–5.11)
RDW: 15.4 % (ref 11.5–15.5)
WBC: 13.9 10*3/uL — ABNORMAL HIGH (ref 4.0–10.5)

## 2016-12-02 NOTE — Progress Notes (Addendum)
[address]       [place] [address]             [phone]      3 Days Post-Op Procedure(s) (LRB): VIDEO BRONCHOSCOPY (N/A) VIDEO ASSISTED THORACOSCOPY (VATS)/ RUL WEDGE RESECTION OF LESION/ NODE SAMPLING (Right) Subjective: C/O soreness  Objective: Vital signs in last 24 hours: Temp:  [97.6 F (36.4 C)-99.2 F (37.3 C)] 98.1 F (36.7 C) (09/10 0434) Pulse Rate:  [66-83] 69 (09/10 0434) Cardiac Rhythm: Normal sinus rhythm (09/09 1900) Resp:  [13-25] 17 (09/10 0434) BP: (121-188)/(62-104) 156/70 (09/10 0434) SpO2:  [91 %-99 %] 93 % (09/10 0434)  Hemodynamic parameters for last 24 hours:    Intake/Output from previous day: 09/09 0701 - 09/10 0700 In: 1250 [P.O.:1200; IV Piggyback:50] Out: 1390 [Urine:1340; Chest Tube:50] Intake/Output this shift: No intake/output data recorded.  General appearance: alert, cooperative and no distress Heart: regular rate and rhythm and extrasystoles Lungs: clear to auscultation bilaterally Abdomen: benign Extremities: no calf tenderness or edema Wound: incis healing well  Lab Results:  Recent Labs  12/01/16 0338 12/02/16 0351  WBC 16.5* 13.9*  HGB 12.0 12.0  HCT 37.1 37.2  PLT 114* 123*   BMET:  Recent Labs  12/01/16 0338 12/02/16 0351  NA 137 139  K 3.4* 3.6  CL 102 106  CO2 27 27  GLUCOSE 100* 100*  BUN 10 9  CREATININE 0.88 0.83  CALCIUM 8.7* 9.2    PT/INR: No results for input(s): LABPROT, INR in the last 72 hours. ABG    Component Value Date/Time   PHART 7.436 11/30/2016 0406   HCO3 28.0 11/30/2016 0406   TCO2 23 12/06/2014 1632   ACIDBASEDEF 0.6 11/29/2016 0647   O2SAT 95.5 11/30/2016 0406   CBG (last 3)  No results for input(s): GLUCAP in the last 72 hours.  Meds Scheduled Meds: . acetaminophen  1,000 mg Oral Q6H   Or  . acetaminophen (TYLENOL) oral liquid 160 mg/5 mL  1,000 mg Oral Q6H  . amLODipine  10 mg Oral Daily  . aspirin EC  81 mg Oral Daily  . atorvastatin   80 mg Oral Daily  . bisacodyl  10 mg Oral Daily  . Chlorhexidine Gluconate Cloth  6 each Topical Daily  . enoxaparin (LOVENOX) injection  40 mg Subcutaneous Q24H  . fentaNYL   Intravenous Q4H  . mouth rinse  15 mL Mouth Rinse BID  . metoprolol tartrate  25 mg Oral BID  . senna-docusate  1 tablet Oral QHS  . sodium chloride flush  10-40 mL Intracatheter Q12H   Continuous Infusions: . dextrose 5 % and 0.9% NaCl 10 mL/hr at 12/01/16 0900  . potassium chloride Stopped (11/30/16 0650)   PRN Meds:.albuterol, cyclobenzaprine, diphenhydrAMINE **OR** diphenhydrAMINE, naloxone **AND** sodium chloride flush, nitroGLYCERIN, ondansetron (ZOFRAN) IV, potassium chloride, sodium chloride flush, traZODone  Xrays Dg Chest Port 1 View  Result Date: 12/01/2016 CLINICAL DATA:  Atelectasis. EXAM: PORTABLE CHEST 1 VIEW COMPARISON:  11/30/2016 FINDINGS: Small right apical pneumothorax again noted. Two chest tubes remain on the right. Right apical airspace disease unchanged. Cardiac enlargement with mitral valve replacement. Negative for heart failure. Negative for pleural effusion. Central line remains in the lower SVC unchanged IMPRESSION: Small right apical pneumothorax unchanged. Two chest tubes on the right remain in place. Right apical airspace disease unchanged following resection. Electronically Signed   By: Franchot Gallo M.D.   On: 12/01/2016 07:11    Assessment/Plan: S/P Procedure(s) (LRB):  VIDEO BRONCHOSCOPY (N/A) VIDEO ASSISTED THORACOSCOPY (VATS)/ RUL WEDGE RESECTION OF LESION/ NODE SAMPLING (Right)  1 doing well 2 No air leak, CXR ok- d/c tube 3 path pending, labs stable , leukocytosis improved 4 d/c pca 5 cont pulm toilet/rehab 6 poss home in am   LOS: 3 days    GOLD,WAYNE E 12/02/2016  Patient doing well, chest tube and central line out today  I have seen and examined Leah [name] and agree with the above assessment  and plan.  [name] MD [facility] [phone] Office  [phone] 12/02/2016 9:41 AM

## 2016-12-02 NOTE — Discharge Instructions (Signed)
Thoracoscopy, Care After °Refer to this sheet in the next few weeks. These instructions provide you with information about caring for yourself after your procedure. Your health care provider may also give you more specific instructions. Your treatment has been planned according to current medical practices, but problems sometimes occur. Call your health care provider if you have any problems or questions after your procedure. °What can I expect after the procedure? °After your procedure, it is common to feel sore for up to two weeks. °Follow these instructions at home: °· There are many different ways to close and cover an incision, including stitches (sutures), skin glue, and adhesive strips. Follow your health care provider's instructions about: °? Incision care. °? Bandage (dressing) changes and removal. °? Incision closure removal. °· Check your incision area every day for signs of infection. Watch for: °? Redness, swelling, or pain. °? Fluid, blood, or pus. °· Take medicines only as directed by your health care provider. °· Try to cough often. Coughing helps to protect against lung infection (pneumonia). It may hurt to cough. If this happens, hold a pillow against your chest when you cough. °· Take deep breaths. This also helps to protect against pneumonia. °· If you were given an incentive spirometer, use it as directed by your health care provider. °· Do not take baths, swim, or use a hot tub until your health care provider approves. You may take showers. °· Avoid lifting until your health care provider approves. °· Avoid driving until your health care provider approves. °· Do not travel by airplane after the chest tube is removed until your health care provider approves. °Contact a health care provider if: °· You have a fever. °· Pain medicines do not ease your pain. °· You have redness, swelling, or increasing pain in your incision area. °· You develop a cough that does not go away, or you are coughing up  mucus that is yellow or green. °Get help right away if: °· You have fluid, blood, or pus coming from your incision. °· There is a bad smell coming from your incision or dressing. °· You develop a rash. °· You have difficulty breathing. °· You cough up blood. °· You develop light-headedness or you feel faint. °· You develop chest pain. °· Your heartbeat feels irregular or very fast. °This information is not intended to replace advice given to you by your health care provider. Make sure you discuss any questions you have with your health care provider. °Document Released: 09/28/2004 Document Revised: 11/12/2015 Document Reviewed: 11/24/2013 °Elsevier Interactive Patient Education © 2018 [employer] ° °

## 2016-12-02 NOTE — Op Note (Signed)
NAME:  KATERA, RYBKA                 ACCOUNT NO.:  MEDICAL RECORD NO.:  [id number]  LOCATION:                                 FACILITY:  PHYSICIAN:  Lanelle Bal, MD         DATE OF BIRTH:  DATE OF PROCEDURE:  11/29/2016 DATE OF DISCHARGE:                              OPERATIVE REPORT   PREOPERATIVE DIAGNOSIS:  Slowly enlarging right upper lobe lung nodule.  POSTOPERATIVE DIAGNOSIS:  Slowly enlarging right upper lobe lung nodule, probably inflammatory nodule.  PROCEDURES PERFORMED:  Bronchoscopy, right video-assisted thoracoscopy with wedge resection of right upper lobe lung lesion and lymph node sampling.  SURGEON:  Lanelle Bal, MD  ASSISTANT:  John Giovanni, PA-C.  BRIEF HISTORY:  The patient is a 66 year old former smoker, who was first seen by me 2 years ago when she presented with severe congestive heart failure.  Had significant coronary artery disease and severe mitral regurgitation.  She underwent mitral valve replacement with pericardial mitral valve and coronary artery bypass grafting.  From a cardiac standpoint, she has done well over the past several years. Because of preexisting small nodules in the lungs, she has been continued to be followed by serial CT scans.  Prior to surgery, the small nodule in the right upper lobe had actually decreased in size. However, earlier in 2018, CT scan suggested that there was a right upper lobe lung nodule, but it had slightly increased in size and became more solid in appearance.  Subsequent CT in August of this year confirmed a very small interval growth.  PET scan showed mild hypermetabolic activity without any other lesions.  Preop clearance by Cardiology was labeled as high risk due to LV dysfunction.  From a functional status, the patient had been doing well, primarily limited __________ 6-minute walk test by left leg pain and not shortness of breath or angina. Because of the slowly increasing size of the  nodule and the patient's previous smoking history, it was recommended to her that we proceed with video-assisted thoracoscopy and wedge resection of the lesion to obtain a definitive diagnosis.  The patient agreed and signed informed consent.  DESCRIPTION OF PROCEDURE:  The patient underwent general endotracheal anesthesia without incident.  A double-lumen endotracheal tube was placed by Anesthesia.  Appropriate time-out was performed, and then, we proceeded with bronchoscopy through the double-lumen endotracheal tube without evidence of endobronchial lesions and good position of the tube. The patient was then turned in the lateral decubitus position with the right side up which had preoperatively been marked.  The right chest was prepped with Betadine and draped in a sterile manner.  The right lung was collapsed.  The patient remained hemodynamically stable with this. In the mid-axillary line, at approximately 4th intercostal space, a small incision was made and entered in the right chest.  A 30-degree video scope was then introduced into the chest and the chest visually surveyed.  There were few adhesions present.  The lesion itself could not be visualized, but with the scope, we made a small incision at approximately 2nd and 3rd intercostal space in the anterior axillary line.  Through the small incision and with  the assistance of the scope, we were able to localize by palpation the nodule in question.  A second incision was made lower on the chest anteriorly as a port site and between the 2 port sites and the small incision.  We were able to wedge out the lesion from the right upper lobe.  This specimen was submitted easily as the lesion was palpable in it.  This was submitted to Pathology.  While waiting for pathology, we also sampled 2R and 4R lymph node.  In general, the lymph nodes were very small and the frozen section on the wedge resection was predominantly inflammatory.  A  "few atypical cells were noted."  Because of this, we also froze the margin of the lesion.  There were no malignancy at the margin and no atypia. We then decided that further resection was not indicated.  Two #28 chest tubes, __________ anteriorly standard 28 and posteriorly a Blake drain were left in place and secured.  The lung was reinflated.  There was no air leak at the completion.  The small incision was closed with interrupted 0 Vicryl and a running 2-0 Vicryl and a 3-0 subcuticular stitch.  Dermabond was applied.  The patient was awakened in the operating room and extubated.  At the completion of procedure, all sponge and needle count was reported as correct.  Blood loss was less than 100 mL.     Lanelle Bal, MD     EG/MEDQ  D:  12/02/2016  T:  12/02/2016  Job:  [id number]

## 2016-12-02 NOTE — Discharge Summary (Signed)
Physician Discharge Summary  Patient ID: Leah [name] MRN: [id number] DOB/AGE: 06-25-1950 66 y.o.  Admit date: 11/29/2016 Discharge date: 12/03/2016  Admission Diagnoses:Right upper lobe lung nodule  Discharge Diagnoses:  Active Problems:   Lung nodule  Patient Active Problem List   Diagnosis Date Noted  . Lung nodule 11/29/2016  . Dysphagia 08/26/2016  . Health care maintenance 04/17/2016  . Dizziness 04/17/2016  . Decreased visual acuity 01/17/2016  . Depressed mood 07/27/2015  . S/P MVR (mitral valve replacement) 12/05/2014  . Moderate aortic regurgitation 10/26/2014  . Renal vascular disease 10/26/2014  . Acute on chronic diastolic congestive heart failure ([place]) 10/21/2014  . Tobacco use 10/21/2014  . Diastolic dysfunction, grade 2 by echo June 2016 10/21/2014  . Carpal tunnel syndrome 10/17/2014  . Pulmonary nodule, right-needs repeat CT in Dec 2016 09/17/2014  . Severe mitral regurgitation 08/25/2014  . Unstable angina ([place]) 08/22/2014  . Low back pain 07/05/2012  . PVC (premature ventricular contraction) 10/02/2011  . History of angioedema with ACE 2013 06/14/2011  . PVD- s/p multiple proceedures 06/04/2011  . Hyperlipidemia 09/28/2008  . Essential hypertension, benign 09/28/2008  . CAD S/P LAD DES 2009 with 70% ISR 08/24/14 09/28/2008   History of Present Illness:     Patient returns office for follow-up evaluation of a small right upper lobe lung nodule. This nodule is been present for several years but is slowly increasing in size, from 4-5 mm to 11 mm. Follow-up scan from May until now shows fairly stable size but definitely bigger than scans done in 2016.   Patient has returned to smoking.  She denies any definite anginal chest pain but has had episodes of "reflux" that radiates to her back.   The patient was admitted this hospitalization for elective resection.    Discharged Condition: good  Hospital Course: The patient was admitted electively  and on 11/29/2016 taken to the operating room where she underwent the below described procedure. She tolerated well was taken to the postanesthesia care unit in stable condition.  Postoperative hospital course:  Patient is overall done well. She is maintained stable hemodynamics. All routine lines, monitors and drainage devices have been discontinued in the standard fashion over time. She has been followed closely with serial chest x-rays. Pathology report  is listed below- adenocarcinoma. The frozen section showed inflammatory changes with some atypia. Incision is noted to be healing well without evidence of infection. She is tolerating diet and gradually increasing activities using standard protocols. Oxygen has been weaned and she maintains good saturations on room air. At time of discharge the patient is felt to be quite stable.  Consults: None  Significant Diagnostic Studies: routine post-op labs/ serial CXR's  Treatments: surgery:        11/29/2016  11:43 AM  PATIENT:  Leah [name]  66 y.o. female  PRE-OPERATIVE DIAGNOSIS:  LUNG NODULE  POST-OPERATIVE DIAGNOSIS:  LUNG NODULE- atypical cells, likely inflammatory nodule   PROCEDURE:  Procedure(s): VIDEO BRONCHOSCOPY (N/A) VIDEO ASSISTED THORACOSCOPY (VATS)/ RUL WEDGE RESECTION OF LESION/ NODE SAMPLING (Right)  SURGEON:  Surgeon(s) and Role:    [name], MD - Primary  PHYSICIAN ASSISTANT: Tyreisha Ungar PA-C  Discharge Exam:  General appearance: alert, cooperative and no distress Heart: regular rate and rhythm Lungs: clear to auscultation bilaterally Abdomen: benign Extremities: no edema or calf tenderness Wound: incis healing well  Disposition: 01-Home or Self Care FINAL DIAGNOSIS Diagnosis 1. Lung, wedge biopsy/resection, right upper lobe - NON SMALL CELL CARCINOMA, MOST  CONSISTENT WITH POORLY DIFFERENTIATED ADENOCARCINOMA, SPANNING 1.0 CM. - THE SURGICAL RESECTION MARGINS ARE NEGATIVE FOR  CARCINOMA. - SEE ONCOLOGY TABLE BELOW. 2. Lymph node, biopsy, 4 R node - ESSENTIALLY ACELLULAR SPECIMEN. - NO LYMPH NODAL TISSUE IDENTIFIED. 3. Lymph node, biopsy, 2 R node - THERE IS NO EVIDENCE OF CARCINOMA IN 1 OF 1 LYMPH NODE (0/1). Microscopic Comment 1. LUNG Specimen, including laterality: Right upper lobe Procedure: Wedge resection Specimen integrity (intact/disrupted): Intact Tumor site: Right upper lobe Tumor focality: Unifocal Maximum tumor size (cm): 1.0 cm Histologic type: Non small cell carcinoma, most consistent with adenocarcinoma Grade: Poorly differentiated Margins: Negative for carcinoma Distance to closest margin (cm): 1.5 cm Visceral pleura invasion: Not identified Tumor extension: Confined to lung parenchyma Treatment effect (if treated with neoadjuvant therapy): N/A Lymph -Vascular invasion: Not identified Lymph nodes: Number examined - 1; Number N1 nodes positive 0; Number N2 nodes positive 0 TNM code: pT1a, pN0 Ancillary Studies: Can be performed upon clinician request Best tumor block for sendout testing: 1C 1 of 3 FINAL for SNOW, [place] ([id number]) Microscopic Comment(continued) Non-neoplastic lung: No significant findings Comments: The specimen reveals a poorly circumscribed 1.0 cm nodule which on histologic evaluation consists of inflammation and fibrosis. However, there are nests of non small cell carcinoma. The tumor cells are positive for TTF-1 and Napsin-A as well as cytokeratin 5/6. The combination of TTF-1 positivity with Napsin-A positivity is most consistent with a poorly differentiated adenocarcinoma. AFB, GMS, and PAS stains are negative for the presence of microorganisms. Dr. Vicente Males has reviewed the case and concurs with this interpretation. Dr. Servando Snare was paged on 12/03/2016. (JBK:gt, 12/03/16) Enid Cutter MD Pathologist, Electronic Signature (Case signed 12/03/2016) Intraoperative Diagnosis 1. RIGHT UPPER LOBE WEDGE  RESECTION, FROZEN SECTION DIAGNOSES A. MARGIN - NO ATYPIA OR MALIGNANCY. [place] WITH FOCAL ATYPIA. (JM) Specimen Gross and Clinical Information Specimen(s) Obtained: 1. Lung, wedge biopsy/resection, right upper lobe 2. Lymph node, biopsy, 4 R node 3. Lymph node, biopsy, 2 R node Specimen Clinical Information 1. lung nodule (kp) Gross 1. Received fresh for rapid intraoperative consult is an 11-gram, 6.3 x 3.2 x 1.7 cm wedge resection of lung. The margin is stapled. The pleura is smooth, tan-pink to anthracotic. There is a 1.0 x 1.0 x 0.9 cm firm, tan-gray subpleural nodule, which has ill-defined borders. The nodule is located 1.5 cm from the nearest stapled margin. The surrounding parenchyma is spongy red-brown. Sections are submitted for frozen section in two blocks. Sections are submitted in a total of five cassettes. A = frozen section of nearest parenchymal margin. B = frozen section of nodule. C, D = sections of nodule. E = uninvolved parenchyma. 2. Received fresh is a 0.2 cm soft tan tissue fragment, which is submitted in toto. 3. Received fresh is a 0.6 x 0.5 x 0.2 cm fragment of soft tan yellow tissue, which is submitted in toto. (GRP:ecj 11/29/2016) Stain(s) used in Diagnosis: The following stain(s) were used in diagnosing the case: *PAS/F Stain, P63, CK 5/6, *Acid Fast Bacillus Stain, Napsin-A, GMS, Thyroid Transcription Factor -1. The control(s) stained appropriately. Disclaimer Some of these immunohistochemical stains may have been developed and the performance characteristics determined by [facility]. Some may not have been cleared or approved by the U.S. Food and Drug Administration. The FDA has determined that such clearance or approval is not necessary. This test is used for clinical purposes. It should not be regarded as investigational or for research. This laboratory is certified under  the Clinical Laboratory Improvement Amendments of 1988  (CLIA-88) as qualified to perform high complexity clinical laboratory testing. 2 of 3 FINAL for ELISE, [place] ([id number]) Report signed out from the following location(s)  Allergies as of 12/03/2016      Reactions   Lisinopril Swelling   Angioedema 06/10/11   Aspirin Other (See Comments)   Upset stomach   Chantix [varenicline]    insomnia   Penicillins Hives   Has patient had a PCN reaction causing immediate rash, facial/tongue/throat swelling, SOB or lightheadedness with hypotension: Yes Has patient had a PCN reaction causing severe rash involving mucus membranes or skin necrosis: No Has patient had a PCN reaction that required hospitalization: No Has patient had a PCN reaction occurring within the last 10 years: No If all of the above answers are "NO", then may proceed with Cephalosporin use.      Medication List    STOP taking these medications   nicotine 14 mg/24hr patch Commonly known as:  NICODERM CQ - dosed in mg/24 hours     TAKE these medications   albuterol 108 (90 Base) MCG/ACT inhaler Commonly known as:  PROVENTIL HFA;VENTOLIN HFA Inhale 2 puffs into the lungs every 6 (six) hours as needed for wheezing or shortness of breath.   amLODipine 10 MG tablet Commonly known as:  NORVASC Take 1 tablet (10 mg total) by mouth daily.   aspirin EC 81 MG tablet Take 1 tablet (81 mg total) by mouth daily.   atorvastatin 80 MG tablet Commonly known as:  LIPITOR Take 1 tablet (80 mg total) by mouth daily.   cyclobenzaprine 10 MG tablet Commonly known as:  FLEXERIL Take 1 tablet (10 mg total) by mouth 3 (three) times daily as needed. for muscle spams   metoprolol tartrate 25 MG tablet Commonly known as:  LOPRESSOR Take 1 tablet (25 mg total) by mouth 2 (two) times daily.   nitroGLYCERIN 0.4 MG SL tablet Commonly known as:  NITROSTAT Place 1 tablet (0.4 mg total) under the tongue every 5 (five) minutes x 3 doses as needed for chest pain.   traMADol 50 MG  tablet Commonly known as:  ULTRAM Take 1-2 tablets (50-100 mg total) by mouth every 6 (six) hours as needed. for pain   traZODone 100 MG tablet Commonly known as:  DESYREL Take 1 tablet (100 mg total) by mouth at bedtime as needed for sleep.            Discharge Care Instructions        Start     Ordered   12/03/16 0000  traMADol (ULTRAM) 50 MG tablet  Every 6 hours PRN    Question:  Supervising Provider  Answer:  [name]   12/03/16 4562     Follow-up Information    [name], MD Follow up.   Specialty:  Cardiothoracic Surgery Why:  Right upper lobe wedge resection appointment to see the surgeon will be arranged, please see discharge paperwork. Obtain a chest x-ray one half hour prior to appointment with surgeon at [facility]. [facility] is in the same office complex Contact information: [address] [place] [address] [phone]           Signed: John Giovanni 12/03/2016, 7:41 AM

## 2016-12-02 NOTE — Care Management Note (Signed)
Case Management Note Marvetta Gibbons RN, BSN Unit 4E-Case Manager [phone]    Patient Details  Name: Leah [name] MRN: [id number] Date of Birth: March 09, 1951  Subjective/Objective:     Pt admitted s/p VATS               Action/Plan: PTA pt lived at home with brother- CM to follow for d/c needs-   Expected Discharge Date:                  Expected Discharge Plan:  Home/Self Care  In-House Referral:  NA  Discharge planning Services  CM Consult  Post Acute Care Choice:    Choice offered to:     DME Arranged:    DME Agency:     HH Arranged:    HH Agency:     Status of Service:  In process, will continue to follow  If discussed at Long Length of Stay Meetings, dates discussed:    Discharge Disposition:   Additional Comments:  Leah Patricia, RN 12/02/2016, 10:04 AM

## 2016-12-02 NOTE — Progress Notes (Signed)
12/02/2016 10:52 AM Chest tube removed, patient tolerated well, will obtained CXR at 1500.

## 2016-12-03 ENCOUNTER — Inpatient Hospital Stay (HOSPITAL_COMMUNITY): Payer: Medicaid Other

## 2016-12-03 ENCOUNTER — Encounter: Payer: Self-pay | Admitting: Cardiothoracic Surgery

## 2016-12-03 ENCOUNTER — Telehealth: Payer: Self-pay | Admitting: *Deleted

## 2016-12-03 DIAGNOSIS — Z01810 Encounter for preprocedural cardiovascular examination: Secondary | ICD-10-CM

## 2016-12-03 DIAGNOSIS — R931 Abnormal findings on diagnostic imaging of heart and coronary circulation: Secondary | ICD-10-CM

## 2016-12-03 DIAGNOSIS — I5189 Other ill-defined heart diseases: Principal | ICD-10-CM

## 2016-12-03 MED ORDER — TRAMADOL HCL 50 MG PO TABS: 50.0000 mg | ORAL_TABLET | Freq: Four times a day (QID) | ORAL | 0 refills | Status: DC | PRN

## 2016-12-03 NOTE — Progress Notes (Signed)
[address]       [place] [address]             [phone]      4 Days Post-Op Procedure(s) (LRB): VIDEO BRONCHOSCOPY (N/A) VIDEO ASSISTED THORACOSCOPY (VATS)/ RUL WEDGE RESECTION OF LESION/ NODE SAMPLING (Right) Subjective: Feels fine, CXR is stable  Objective: Vital signs in last 24 hours: Temp:  [98.7 F (37.1 C)] 98.7 F (37.1 C) (09/11 0400) Pulse Rate:  [26-104] 72 (09/11 0000) Cardiac Rhythm: Other (Comment) (09/11 0504) Resp:  [15-26] 17 (09/10 1800) BP: (152-175)/(65-81) 153/80 (09/11 0000) SpO2:  [58 %-99 %] 92 % (09/11 0000)  Hemodynamic parameters for last 24 hours:    Intake/Output from previous day: 09/10 0701 - 09/11 0700 In: -  Out: 1000 [Urine:1000] Intake/Output this shift: No intake/output data recorded.  General appearance: alert, cooperative and no distress Heart: regular rate and rhythm Lungs: clear to auscultation bilaterally Abdomen: benign Extremities: no edema or calf tenderness Wound: incis healing well  Lab Results:  Recent Labs  12/01/16 0338 12/02/16 0351  WBC 16.5* 13.9*  HGB 12.0 12.0  HCT 37.1 37.2  PLT 114* 123*   BMET:  Recent Labs  12/01/16 0338 12/02/16 0351  NA 137 139  K 3.4* 3.6  CL 102 106  CO2 27 27  GLUCOSE 100* 100*  BUN 10 9  CREATININE 0.88 0.83  CALCIUM 8.7* 9.2    PT/INR: No results for input(s): LABPROT, INR in the last 72 hours. ABG    Component Value Date/Time   PHART 7.436 11/30/2016 0406   HCO3 28.0 11/30/2016 0406   TCO2 23 12/06/2014 1632   ACIDBASEDEF 0.6 11/29/2016 0647   O2SAT 95.5 11/30/2016 0406   CBG (last 3)  No results for input(s): GLUCAP in the last 72 hours.  Meds Scheduled Meds: . acetaminophen  1,000 mg Oral Q6H   Or  . acetaminophen (TYLENOL) oral liquid 160 mg/5 mL  1,000 mg Oral Q6H  . amLODipine  10 mg Oral Daily  . aspirin EC  81 mg Oral Daily  . atorvastatin  80 mg Oral Daily  . bisacodyl  10 mg Oral Daily  . Chlorhexidine  Gluconate Cloth  6 each Topical Daily  . enoxaparin (LOVENOX) injection  40 mg Subcutaneous Q24H  . mouth rinse  15 mL Mouth Rinse BID  . metoprolol tartrate  25 mg Oral BID  . senna-docusate  1 tablet Oral QHS  . sodium chloride flush  10-40 mL Intracatheter Q12H   Continuous Infusions: . dextrose 5 % and 0.9% NaCl 10 mL/hr at 12/02/16 1513  . potassium chloride Stopped (11/30/16 0650)   PRN Meds:.albuterol, cyclobenzaprine, nitroGLYCERIN, ondansetron (ZOFRAN) IV, potassium chloride, sodium chloride flush, traZODone  Xrays Dg Chest Port 1 View  Result Date: 12/02/2016 CLINICAL DATA:  Chest tube removal. EXAM: PORTABLE CHEST 1 VIEW COMPARISON:  12/02/2016 FINDINGS: Right chest tube has been removed. Small right apical pneumothorax is present, estimated to be 5% of lung volume. Surgical clips are noted in the right lung apex. The heart is mildly enlarged. Status post median sternotomy and valve replacement. Right IJ central line tip overlies the region of the superior vena cava. There is minimal bibasilar atelectasis. IMPRESSION: 1. Approximately 5% volume right apical pneumothorax. 2. Stable cardiomegaly. Electronically Signed   By: Nolon Nations M.D.   On: 12/02/2016 15:37   Dg Chest Port 1 View  Result Date: 12/02/2016 CLINICAL DATA:  Status post partial lobectomy. EXAM: PORTABLE  CHEST 1 VIEW COMPARISON:  Chest x-ray from yesterday. FINDINGS: Postsurgical changes related to prior median sternotomy and mitral valve replacement. Interval removal of 1 right-sided chest tube. Right internal jugular central venous catheter and remaining right-sided chest tube are unchanged. Stable cardiomediastinal silhouette. Normal pulmonary vascularity. Small right apical pneumothorax has resolved. Improved aeration in the right upper lobe. The left lung is clear. No pleural effusion. IMPRESSION: 1. No significant residual right apical pneumothorax. 2. Improved right upper lobe aeration. Electronically Signed    By: Titus Dubin M.D.   On: 12/02/2016 07:53    Assessment/Plan: S/P Procedure(s) (LRB): VIDEO BRONCHOSCOPY (N/A) VIDEO ASSISTED THORACOSCOPY (VATS)/ RUL WEDGE RESECTION OF LESION/ NODE SAMPLING (Right)  1 doing well, stable for discharge, path is pending   LOS: 4 days    Osias Resnick E 12/03/2016

## 2016-12-03 NOTE — Progress Notes (Signed)
12/03/2016 12:49 PM Discharge AVS meds taken today and those due this evening reviewed.  Follow-up appointments and when to call md reviewed.  D/C IV and TELE.  Questions and concerns addressed.   D/C home per orders. [place]

## 2016-12-03 NOTE — Telephone Encounter (Signed)
-----   Message from [place], [place] sent at 11/20/2016  5:32 PM EDT ----- Please let the patient know her stress test showed evidence of her prior infarction but no ischemia that would be concerning for a potential blockage. Her EF was reduced, therefore would recommend a limited echo to reassess EF as this can sometimes be read as falsely low on a stress test (EF was 45-50% by echo in 2016). She does not need to have the echocardiogram performed prior to surgery as this can be scheduled at a later date. Is cleared from a cardiac perspective to proceed with surgery.

## 2016-12-03 NOTE — Care Management Note (Signed)
Case Management Note Marvetta Gibbons RN, BSN Unit 4E-Case Manager [phone]    Patient Details  Name: Leah [name] MRN: [id number] Date of Birth: [birth year]  Subjective/Objective:     Pt admitted s/p VATS               Action/Plan: PTA pt lived at home with brother- CM to follow for d/c needs-   Expected Discharge Date:  12/03/16               Expected Discharge Plan:  Home/Self Care  In-House Referral:  NA  Discharge planning Services  CM Consult  Post Acute Care Choice:  NA Choice offered to:  NA  DME Arranged:    DME Agency:     HH Arranged:    [place] Agency:     Status of Service:  Completed, signed off  If discussed at [place] of Stay Meetings, dates discussed:    Discharge Disposition: home/self care   Additional Comments:  12/03/16- 1010- Keaja Reaume RN, CM- pt for d/c home today- no CM needs noted for discharge  Dawayne Patricia, RN 12/03/2016, 10:14 AM

## 2016-12-03 NOTE — Telephone Encounter (Signed)
Results and recommendations discussed with patient, who verbalized understanding and thanks. She informed me OK to go ahead w ordering return echocardiogram and aware we will call to set this up.  msg sent to scheduling.

## 2016-12-05 ENCOUNTER — Telehealth: Payer: Self-pay | Admitting: Cardiothoracic Surgery

## 2016-12-05 ENCOUNTER — Other Ambulatory Visit: Payer: Self-pay | Admitting: *Deleted

## 2016-12-05 DIAGNOSIS — M545 Low back pain: Principal | ICD-10-CM

## 2016-12-05 DIAGNOSIS — G8929 Other chronic pain: Secondary | ICD-10-CM

## 2016-12-05 MED ORDER — CYCLOBENZAPRINE HCL 10 MG PO TABS: 10.0000 mg | ORAL_TABLET | Freq: Three times a day (TID) | ORAL | 2 refills | Status: DC | PRN

## 2016-12-05 MED ORDER — TRAZODONE HCL 100 MG PO TABS: 100.0000 mg | ORAL_TABLET | Freq: Every evening | ORAL | 2 refills | Status: DC | PRN

## 2016-12-05 MED ORDER — AMLODIPINE BESYLATE 10 MG PO TABS: 10.0000 mg | ORAL_TABLET | Freq: Every day | ORAL | 3 refills | Status: DC

## 2016-12-05 MED ORDER — METOPROLOL TARTRATE 25 MG PO TABS: 25.0000 mg | ORAL_TABLET | Freq: Two times a day (BID) | ORAL | 2 refills | Status: DC

## 2016-12-05 MED ORDER — ATORVASTATIN CALCIUM 80 MG PO TABS: 80.0000 mg | ORAL_TABLET | Freq: Every day | ORAL | 2 refills | Status: DC

## 2016-12-05 NOTE — Telephone Encounter (Signed)
Call patient and reviewed updated with her the final path of her lung resection, which did show a small focus of adenocarcinoma. Stage I. I discussed this with her and also told her that the case of been some crusted the multidisciplinary thoracic oncology conference and the consensus was no other further treatment other than observation was necessary.

## 2016-12-10 ENCOUNTER — Ambulatory Visit (INDEPENDENT_AMBULATORY_CARE_PROVIDER_SITE_OTHER): Payer: Self-pay | Admitting: *Deleted

## 2016-12-10 DIAGNOSIS — Z09 Encounter for follow-up examination after completed treatment for conditions other than malignant neoplasm: Secondary | ICD-10-CM

## 2016-12-10 DIAGNOSIS — C3411 Malignant neoplasm of upper lobe, right bronchus or lung: Secondary | ICD-10-CM

## 2016-12-10 DIAGNOSIS — Z4802 Encounter for removal of sutures: Secondary | ICD-10-CM

## 2016-12-10 NOTE — Progress Notes (Signed)
Leah [name] returns for suture removal of two previous chest tube sites s/p R VATS RLLobe Wedge Resection on 11/29/16. Her operative sites are well healed. She is doing well with no problems. She is using Tramadol. She will return as scheduled with a CXR.

## 2016-12-18 ENCOUNTER — Other Ambulatory Visit: Payer: Self-pay | Admitting: Cardiothoracic Surgery

## 2016-12-18 ENCOUNTER — Other Ambulatory Visit (HOSPITAL_COMMUNITY): Payer: Medicaid Other

## 2016-12-18 DIAGNOSIS — C3491 Malignant neoplasm of unspecified part of right bronchus or lung: Secondary | ICD-10-CM

## 2016-12-19 ENCOUNTER — Ambulatory Visit (INDEPENDENT_AMBULATORY_CARE_PROVIDER_SITE_OTHER): Payer: Self-pay | Admitting: Cardiothoracic Surgery

## 2016-12-19 ENCOUNTER — Ambulatory Visit
Admission: RE | Admit: 2016-12-19 | Discharge: 2016-12-19 | Disposition: A | Discharge status: Home / Self Care | Payer: Medicaid Other | Source: Ambulatory Visit | Attending: Cardiothoracic Surgery | Admitting: Cardiothoracic Surgery

## 2016-12-19 ENCOUNTER — Encounter: Payer: Self-pay | Admitting: Cardiothoracic Surgery

## 2016-12-19 VITALS — BP 151/78 | HR 55 | Resp 16 | Ht 73.0 in | Wt 177.0 lb

## 2016-12-19 DIAGNOSIS — Z09 Encounter for follow-up examination after completed treatment for conditions other than malignant neoplasm: Secondary | ICD-10-CM

## 2016-12-19 DIAGNOSIS — C3491 Malignant neoplasm of unspecified part of right bronchus or lung: Secondary | ICD-10-CM

## 2016-12-19 DIAGNOSIS — C3411 Malignant neoplasm of upper lobe, right bronchus or lung: Secondary | ICD-10-CM

## 2016-12-19 MED ORDER — TRAMADOL HCL 50 MG PO TABS: 50.0000 mg | ORAL_TABLET | Freq: Four times a day (QID) | ORAL | 0 refills | Status: DC | PRN

## 2016-12-19 NOTE — Progress Notes (Signed)
[name]       [name] [address]             [phone]      Leah [name] [facility] Medical Record #[id number] Date of Birth: [birth year]  Referring: Martinique, Peter M, MD Primary Care: Guadalupe Dawn, MD  Chief Complaint:   POST OP FOLLOW UP 11/29/2016 DATE OF DISCHARGE:  OPERATIVE REPORT PREOPERATIVE DIAGNOSIS:  Slowly enlarging right upper lobe lung nodule. POSTOPERATIVE DIAGNOSIS:  Slowly enlarging right upper lobe lung nodule, probably inflammatory nodule. PROCEDURES PERFORMED:  Bronchoscopy, right video-assisted thoracoscopy with wedge resection of right upper lobe lung lesion and lymph node sampling.  Cancer Staging Bronchogenic lung cancer, right [facility]) Staging form: Lung, AJCC 8th Edition - Pathologic stage from 12/03/2016: Stage IA1 (pT1a, pN0, cM0) - Signed by [name], MD on 64/01/2017    64/02/2015  OPERATIVE REPORT PREOPERATIVE DIAGNOSES: 1. Severe mitral insufficiency. 2. Aortic insufficiency. 3. Coronary occlusive disease. POSTOPERATIVE DIAGNOSES: 1. Severe mitral insufficiency. 2. Aortic insufficiency. 3. Coronary occlusive disease. 4. Aortic insufficiency, mild. PROCEDURE PERFORMED: Mitral valve replacement with [facility] pericardial tissue valve, model 7300TFX 29 mm, serial #[id number] and coronary artery bypass grafting x1 with the left internal mammary to the left anterior descending coronary artery. Closure of left atrial appendage. SURGEON: Lanelle Bal, MD.  History of Present Illness:     Patient returns office for follow-up evaluation of a small right upper lobe lung nodule resected 11/29/2016. The nodule was present for several years but is slowly increasing in size, from 4-5 mm to 11 mm. Follow-up scan from May until now shows fairly stable size but definitely bigger than scans done in 2016. She underwent wedge resection of the nodule, initial frozen section suggestive of  inflammatory mass final pass showed small focus of poorly differentiated adenocarcinoma. The patient was presented at the multidisciplinary thoracic oncology conference , no other postop treatment was recommended other than close follow-up.  Patient notes that she is smoking but again expresses a desire to stop. Previously she had significant reaction to Wellbutrin .   Past Medical History:  Diagnosis Date  . Anemia   . Bronchogenic lung cancer, right ([place]) 64/09/2016  . CAD (coronary artery disease)    a. s/p LIMA-LAD in 11/2014  . Carotid artery occlusion   . Complication of anesthesia    slow to awaken x 1 maybe 66  . COPD (chronic obstructive pulmonary disease) ([place])   . GERD (gastroesophageal reflux disease)    "sometimes" takes Copywriter, advertising or drinks gingerale   . Headache(784.0)   . History of kidney stones   . Hyperlipidemia   . Hypertension   . Leg pain   . Peripheral vascular disease ([place])   . Renal vascular disease 10/26/2014   bilateral stents placed   . Severe mitral regurgitation    a. s/p MVR in 11/2014 with a pericardial tissue valve  . Shortness of breath dyspnea   . Tobacco abuse      History  Smoking Status  . Former Smoker  . Packs/day: 0.50  . Years: 35.00  . Types: Cigarettes  . Quit date: 11/25/2016  Smokeless Tobacco  . Never Used    Comment: has not started wellbutrin yet    History  Alcohol Use No     Allergies  Allergen Reactions  . Lisinopril Swelling    Angioedema 06/10/11  . Aspirin Other (See Comments)    Upset stomach  . Chantix [Varenicline]  insomnia  . Penicillins Hives    Has patient had a PCN reaction causing immediate rash, facial/tongue/throat swelling, SOB or lightheadedness with hypotension: Yes Has patient had a PCN reaction causing severe rash involving mucus membranes or skin necrosis: No Has patient had a PCN reaction that required hospitalization: No Has patient had a PCN reaction occurring within the last 10  years: No If all of the above answers are "NO", then may proceed with Cephalosporin use.     Current Outpatient Prescriptions  Medication Sig Dispense Refill  . albuterol (PROVENTIL HFA;VENTOLIN HFA) 108 (90 Base) MCG/ACT inhaler Inhale 2 puffs into the lungs every 6 (six) hours as needed for wheezing or shortness of breath. 1 Inhaler 0  . amLODipine (NORVASC) 10 MG tablet Take 1 tablet (10 mg total) by mouth daily. 90 tablet 3  . aspirin EC 81 MG tablet Take 1 tablet (81 mg total) by mouth daily. 90 tablet 1  . atorvastatin (LIPITOR) 80 MG tablet Take 1 tablet (80 mg total) by mouth daily. 30 tablet 2  . cyclobenzaprine (FLEXERIL) 10 MG tablet Take 1 tablet (10 mg total) by mouth 3 (three) times daily as needed. for muscle spams 70 tablet 2  . metoprolol tartrate (LOPRESSOR) 25 MG tablet Take 1 tablet (25 mg total) by mouth 2 (two) times daily. 60 tablet 2  . nitroGLYCERIN (NITROSTAT) 0.4 MG SL tablet Place 1 tablet (0.4 mg total) under the tongue every 5 (five) minutes x 3 doses as needed for chest pain. 25 tablet 12  . traMADol (ULTRAM) 50 MG tablet Take 1 tablet (50 mg total) by mouth every 6 (six) hours as needed for severe pain. for pain 30 tablet 0  . traZODone (DESYREL) 100 MG tablet Take 1 tablet (100 mg total) by mouth at bedtime as needed for sleep. 30 tablet 2   No current facility-administered medications for this visit.        Physical Exam: BP (!) 151/78 (BP Location: Right Arm, Patient Position: Sitting, Cuff Size: Large)   Pulse (!) 55   Resp 16   Ht 6\' 1"  (1.854 m)   Wt 177 lb (80.3 kg)   SpO2 99% Comment: ON RA  BMI 23.35 kg/m   Physical Exam  Vitals reviewed. Constitutional: She is oriented to person, place, and time. No distress.  HENT:  Head: Normocephalic and atraumatic.  Eyes: Left eye exhibits no discharge. No scleral icterus.  Neck: No JVD present. No tracheal deviation present. No thyromegaly present.  Cardiovascular: Normal rate, regular rhythm and  intact distal pulses.  Exam reveals no gallop and no friction rub.   No murmur heard. Respiratory: No stridor. No respiratory distress. She has no wheezes. She has no rales. She exhibits no tenderness.  GI: She exhibits no distension. There is no tenderness. There is no rebound.  Musculoskeletal: She exhibits no edema, tenderness or deformity.  Neurological: She is alert and oriented to person, place, and time. No cranial nerve deficit.  Skin: Skin is warm and dry.  Psychiatric: She has a normal mood and affect. Her behavior is normal. Judgment and thought content normal.  Median sternotomy incision is well-healed Incisions and port sites in the right chest are also well-healed Patient has no cervical or supraclavicular or axillary adenopathy  Diagnostic Studies & Laboratory data:     Recent Radiology Findings:    Dg Chest 2 View  Result Date: 12/19/2016 CLINICAL DATA:  History of right bronchogenic lung cancer. EXAM: CHEST  2 VIEW COMPARISON:  Radiographs of December 03, 2016. FINDINGS: Stable cardiomediastinal silhouette. Status post mitral valve repair. Stable right upper lobe scarring and postoperative changes noted. No pneumothorax or pleural effusion is noted. No acute pulmonary disease is noted. Bony thorax is unremarkable. IMPRESSION: No active cardiopulmonary disease. Electronically Signed   By: Marijo Conception, M.D.   On: 12/19/2016 08:37  I have independently reviewed the above radiology studies  and reviewed the findings with the patient.    CLINICAL DATA:  66 year old female with greater than 30 pack-year history of smoking, for initial lung cancer screening  EXAM: CT CHEST WITHOUT CONTRAST LOW-DOSE FOR LUNG CANCER SCREENING  TECHNIQUE: Multidetector CT imaging of the chest was performed following the standard protocol without IV contrast.  COMPARISON:  Multiple prior CTs, most recently 09/21/2015  FINDINGS: Cardiovascular: The heart is normal in size. No  pericardial effusion. Prosthetic mitral valve.  Three vessel coronary atherosclerosis.  No evidence of thoracic aortic aneurysm. Atherosclerotic calcifications of the aortic arch.  Mediastinum/Nodes: No suspicious mediastinal lymphadenopathy.  Visualized thyroid is unremarkable.  Lungs/Pleura: 9.8 mm (volumetric mean) spiculated nodule in the anterior right upper lobe (series 3/ image 65). This has significantly progressed from the prior studies.  Additional scattered small bilateral pulmonary nodules measuring up to 3.8 mm.  3.2 mm calcified granuloma in the right upper lobe.  Underlying mild centrilobular and paraseptal emphysematous changes, upper lobe predominant.  Mild biapical pleural-parenchymal scarring.  Mild linear scarring/ atelectasis in the left lower lobe. No focal consolidation.  No pleural effusion or pneumothorax.  Upper Abdomen: Visualized upper abdomen is unremarkable.  Musculoskeletal: Median sternotomy.  IMPRESSION: 9.8 mm spiculated nodule in the anterior right upper lobe. Lung-RADS Category 4A, suspicious. Follow up low-dose chest CT without contrast in 3 months (please use the following order, "CT CHEST LCS NODULE FOLLOW-UP W/O CM") is recommended. Alternatively, PET may be considered.   Electronically Signed   By: Julian Hy M.D.   On: 07/29/2016 14:24     Ct Chest Wo Contrast  09/21/2015  CLINICAL DATA:  Follow-up pulmonary nodule.  Current smoker. EXAM: CT CHEST WITHOUT CONTRAST TECHNIQUE: Multidetector CT imaging of the chest was performed following the standard protocol without IV contrast. COMPARISON:  06/08/2015 chest CT. FINDINGS: Mediastinum/Nodes: Mild cardiomegaly. No significant pericardial fluid/thickening. Left anterior, left circumflex and right coronary atherosclerosis status post CABG with left internal mammary bypass graft. Mitral valve prosthesis is in place. Atherosclerotic nonaneurysmal thoracic  aorta. Normal caliber pulmonary arteries. No discrete thyroid nodules. Unremarkable esophagus. No pathologically enlarged axillary, mediastinal or gross hilar lymph nodes, noting limited sensitivity for the detection of hilar adenopathy on this noncontrast study. Lungs/Pleura: No pneumothorax. No pleural effusion. Mild centrilobular emphysema and mild diffuse bronchial wall thickening. Apical right upper lobe 3 mm solid pulmonary nodule (series 4/ image 31) has continued to decrease in size, previously 5 mm on 06/08/2015 using similar measurement technique. Additional scattered 2-3 mm centrilobular pulmonary nodules in the upper lungs bilaterally are not appreciably changed and are considered benign. No acute consolidative airspace disease or new significant pulmonary nodules. Stable parenchymal band in the anterior left lower lobe. Upper abdomen: Partially visualized bilateral proximal renal artery stents. Musculoskeletal: No aggressive appearing focal osseous lesions. Sternotomy wires appear aligned and intact. IMPRESSION: 1. Continued reduction in the size of the apical right upper lobe pulmonary nodule, now 3 mm, consistent with resolving inflammatory nodule. 2. Mild centrilobular emphysema and mild diffuse bronchial wall thickening, suggesting COPD. No acute pulmonary disease. 3. Three-vessel coronary atherosclerosis status post  CABG. Stable mild cardiomegaly. Electronically Signed   By: Ilona Sorrel M.D.   On: 09/21/2015 11:10   I have independently reviewed the above radiology studies  and reviewed the findings with the patient.   Ct Super D Chest Wo Contrast  06/08/2015  CLINICAL DATA:  Followup of right apical pulmonary nodule. Prior CABG and valve replacement. Ex-smoker. EXAM: CT CHEST WITHOUT CONTRAST TECHNIQUE: Multidetector CT imaging of the chest was performed using thin slice collimation for electromagnetic bronchoscopy planning purposes, without intravenous contrast. COMPARISON:  03/09/2015 and  10/27/2014. FINDINGS: Mediastinum/Lymph Nodes: Aortic and branch vessel atherosclerosis. Moderate cardiomegaly with mitral valve repair. Prior median sternotomy. No mediastinal or definite hilar adenopathy, given limitations of unenhanced CT. Lungs/Pleura: No pleural fluid.  Mild centrilobular emphysema. The right apical complex nodule is less well-defined today. Solid component measures 4 mm on image 17/series 4 versus 5 mm on the prior. nodule in total measures 8 mm today versus 12 mm on the prior. On sagittal reformats today, 4 mm soft tissue component on image 43 versus 5 mm on the prior. 9 mm overall lesion size today versus 11 mm on the prior (when remeasured). Scarring at the left lung base. Upper abdomen: Normal imaged portions of the liver, spleen, stomach, pancreas, gallbladder, adrenal glands, kidneys. Bilateral renal artery stents. Musculoskeletal: No acute osseous abnormality. IMPRESSION: 1. Decreased size/definition of right apical pulmonary nodule. This suggests resolving infectious or inflammatory etiology. Recommend imaging surveillance with at least 1 more 3- 6 month follow-up. 2.  No acute process in the chest. 3. Cardiomegaly with prior median sternotomy for CABG and mitral valve repair. Electronically Signed   By: Abigail Miyamoto M.D.   On: 06/08/2015 12:37    Ct Chest Wo Contrast  03/09/2015  CLINICAL DATA:  Followup pulmonary nodule. EXAM: CT CHEST WITHOUT CONTRAST TECHNIQUE: Multidetector CT imaging of the chest was performed following the standard protocol without IV contrast. COMPARISON:  Chest CT 10/27/2014 and 08/25/2014 FINDINGS: Mediastinum/Nodes: No breast masses, supraclavicular or axillary lymphadenopathy. The thyroid gland is grossly normal. The heart is normal in size. No pericardial effusion. Stable tortuosity, ectasia and calcifications involving the thoracic aorta. Prosthetic aortic valve is noted along with coronary artery bypass grafts. No mediastinal or hilar mass or  adenopathy. The esophagus is grossly normal. Lungs/Pleura: The lungs are clear of acute process. Stable emphysematous changes. Again demonstrated is a right upper lobe pulmonary lesion which is partly solid and partly cystic. The solid component measures slightly larger on the coronal images. It measures 7.5 mm on image number 53 of series 601. It previously measured 6 mm. The cystic component has a thicker ram than on the prior examinations. The largest measurement on the axial images of 11.5 mm and was previously 10.5 mm. Could not exclude a slow growing neoplasm. PET-CT may be helpful for further evaluation. It would not be an reasonable to continue to follow this with CT with a repeat CT scan in 4-6 months. No new regions.  No acute pulmonary findings.  No pleural effusion. Upper abdomen: No significant upper abdominal findings. Musculoskeletal: No significant bony findings. IMPRESSION: Slight interval increase in size of the right apical lung lesion as described above. Continued CT follow-up (4-6 months) or PET-CT are both reasonable. No new pulmonary lesions or acute pulmonary findings. Stable emphysematous changes. No mediastinal or hilar mass or adenopathy. Electronically Signed   By: Marijo Sanes M.D.   On: 03/09/2015 13:30       CLINICAL DATA: Re-evaluate right upper lobe  pulmonary nodule, prior to possible valve surgery. Initial encounter.  EXAM: CT CHEST WITHOUT CONTRAST  TECHNIQUE: Multidetector CT imaging of the chest was performed following the standard protocol without IV contrast.  COMPARISON: CT of the chest performed 08/25/2014  FINDINGS: The small slightly spiculated nodule at the right lung apex is again seen; there is a small associated cystic component which appears to have increased mildly in size, while the solid component has decreased slightly in size, measuring approximately 5 mm. Given the slight apparent interval decrease in size of the solid component from  the recent prior study, this is thought more likely to be postinfectious in nature, though malignancy cannot be entirely excluded.  No additional pulmonary nodules are seen. The lungs are otherwise clear, aside from mild scarring or atelectasis at the left lung base. No pleural effusion or pneumothorax is seen.  Scattered coronary artery calcifications are noted. The mediastinum is otherwise unremarkable. No mediastinal lymphadenopathy is seen. No pericardial effusion is identified. The great vessels are grossly unremarkable, aside from scattered calcification. Mild calcification is noted along the thoracic aorta. The visualized portions of the thyroid gland are unremarkable. No axillary lymphadenopathy is appreciated.  No acute osseous abnormalities are identified.  IMPRESSION: 1. Small slightly spiculated nodule at the right lung apex is again seen. It has a small cystic component which appears to have increased mildly in size, while the solid component has decreased slightly in size, measuring approximately 5 mm. Given the slight apparent interval decrease in size of the solid component from the recent prior CT, this is thought more likely to be postinfectious in nature, though malignancy cannot be entirely excluded. As this remains too small to further characterize, would recommend strict follow-up CT of the chest in 3-6 months, to ensure stability. 2. Mild scarring or atelectasis at the left lung base. 3. Scattered coronary artery calcifications seen.   Electronically Signed  By: Garald Balding M.D.  On: 10/27/2014 19:40  Study Result   CLINICAL DATA:  Initial treatment strategy for right upper lobe lung nodule.  EXAM: NUCLEAR MEDICINE PET SKULL BASE TO THIGH  TECHNIQUE: 10.0 mCi F-18 FDG was injected intravenously. Full-ring PET imaging was performed from the skull base to thigh after the radiotracer. CT data was obtained and used for attenuation  correction and anatomic localization.  FASTING BLOOD GLUCOSE:  Value: 113 mg/dl  COMPARISON:  Multiple exams, including chest CT from 11/12/2016  FINDINGS: NECK  No hypermetabolic lymph nodes in the neck. Atherosclerotic calcification of the carotid arteries. Symmetric glottic activity is thought to likely be physiologic.  CHEST  1.0 by 0.7 cm right upper lobe pulmonary nodule on image 18 series 7, maximum SUV 3.4, suspicious for malignancy.  A small right axillary lymph node with fatty hilum, total nodes size 10 mm but with the fatty hilum making up a large portion of this measurement has a maximum SUV of 2.1 and is likely benign. Background mediastinal blood pool activity 2.9.  No hypermetabolic mediastinal or hilar adenopathy.  Coronary, aortic arch, and branch vessel atherosclerotic vascular disease. Mild cardiomegaly.  ABDOMEN/PELVIS  No abnormal hypermetabolic activity within the liver, pancreas, adrenal glands, or spleen. No hypermetabolic lymph nodes in the abdomen or pelvis.  Aortoiliac atherosclerotic vascular disease. Abdominal aortic ectasia at 2.9 cm diameter.  Dependent density in the gallbladder potentially from sludge or gallstones. Suspected 2 mm right kidney lower pole nonobstructive renal calculus. Suspected pelvic floor laxity.  SKELETON  No focal hypermetabolic activity to suggest skeletal metastasis.  IMPRESSION:  1. Hypermetabolic 1.0 cm right upper lobe pulmonary nodule suspicious for malignancy. No hypermetabolic adenopathy or metastatic disease. Assuming non-small cell lung cancer the appearance is most compatible with T1a N0 M0 disease (stage 1A). 2. Aortic Atherosclerosis (ICD10-I70.0). Coronary atherosclerosis with mild cardiomegaly. 3. Mild nonobstructive right nephrolithiasis. 4. Sludge or possibly gallstones in the gallbladder.   Electronically Signed   By: Van Clines M.D.   On: 11/21/2016 14:26        Recent Lab Findings: Lab Results  Component Value Date   WBC 13.9 (H) 12/02/2016   HGB 12.0 12/02/2016   HCT 37.2 12/02/2016   PLT 123 (L) 12/02/2016   GLUCOSE 100 (H) 12/02/2016   CHOL 246 (H) 11/18/2016   TRIG 96 11/18/2016   HDL 51 11/18/2016   LDLDIRECT 185 (A) 09/09/2011   LDLCALC 176 (H) 11/18/2016   ALT 13 (L) 12/01/2016   AST 18 12/01/2016   NA 139 12/02/2016   K 3.6 12/02/2016   CL 106 12/02/2016   CREATININE 0.83 12/02/2016   BUN 9 12/02/2016   CO2 27 12/02/2016   TSH 1.756 11/28/2014   INR 1.03 11/29/2016   HGBA1C 5.6 01/15/2016   Notes recorded by Erma Heritage, PA-C on 11/20/2016 at 5:32 PM EDT Please let the patient know her stress test showed evidence of her prior infarction but no ischemia that would be concerning for a potential blockage. Her EF was reduced, therefore would recommend a limited echo to reassess EF as this can sometimes be read as falsely low on a stress test (EF was 45-50% by echo in 2016). She does not need to have the echocardiogram performed prior to surgery as this can be scheduled at a later date. Is cleared from a cardiac perspective to proceed with surgery.      Vitals   Height Weight BMI (Calculated)  6\' 1"  (1.854 m) 177 lb (80.3 kg) 23.36  Study Highlights    Nuclear stress EF: 31%. The left ventricular ejection fraction is moderately decreased (30-44%).  Defect 1: There is a medium defect of moderate severity present in the apical anterior, apical septal, apical inferior and apex location.  Findings consistent with prior apical myocardial infarction.  This is a high risk study based on severely reduced LV function . There is no evidence of ischemia     PFT"S fev1 2.11 76%  dlco 13.88  39%   Interpretation: The FVC, FEV1 and FEV1/FVC ratio are reduced, but the FEF25-75% is within normal limits. The airway resistance is normal. Lung volumes are within normal limits. Following administration of bronchodilators,  there is no significant response. The reduced diffusing capacity indicates a severe loss of functional alveolar capillary surface. However, the diffusing capacity was not corrected for the patient's hemoglobin. Conclusions: Although there is airway obstruction and a diffusion defect suggesting emphysema, the absence of overinflation is inconsistent with that diagnosis. In view of the severity of the diffusion defect, studies with exercise would be helpful to evaluate the presence of hypoxemia. Pulmonary Function Diagnosis: Mild Obstructive Airways Disease Severe Diffusion Defect    6 min walk test  6 Minute Walk Test Results  Patient:            Leah [name] [facility] Date:                11/27/2016   Supplemental O2 during test?            none  Baseline                                 End   Time                            1658                                        1703 Heartrate                     70                                            87 Dyspnea                      no                                            no Fatigue                        no                                            Left leg pain O2 sat                          98%                                         99% Blood pressure            122/80                                     128/87   Patient ambulated at a fast pace for a total distance of 540 feet with 1 stop.  Ambulation was limited primarily due to left leg and hip pain.  Overall the test was tolerated well   ECHO: 03/10/2015 Study Conclusions  - Left ventricle: The cavity size was normal. Wall thickness was normal. Systolic function was mildly reduced. The estimated ejection fraction was in the range of 45% to 50%. There is hypokinesis of the basal-mid septal myocardium. - Aortic valve: There was moderate regurgitation. - Mitral valve: A bioprosthesis was present. - Left atrium: The  atrium was mildly dilated.  Impressions:  - Basal septal hypokinesis with mildly reduced LV function; aortic valve not well visualized but probable moderate AI (2+); s/p MVR with no obvious MR; trace TR.  Transthoracic echocardiography. M-mode, complete 2D, spectral Doppler, and color Doppler. Birthdate: Patient birthdate: [birth year]. Age: Patient is 66 yr old. Sex: Gender: female. BMI: 23.9 kg/m^2. Blood pressure:   106/64 Patient status: Outpatient. Study date:  Study date: 03/10/2015. Study time: 02:26 PM. Location: [place] [facility]  -------------------------------------------------------------------  ------------------------------------------------------------------- Left ventricle: The cavity size was normal. Wall thickness was normal. Systolic function was mildly reduced. The estimated ejection fraction was in the range of 45% to 50%. Regional wall motion abnormalities: There is hypokinesis of the basal-mid septal myocardium.  ------------------------------------------------------------------- Aortic valve: Poorly visualized. Doppler: Transvalvular velocity was within the normal range. There was no stenosis. There was moderate regurgitation.  ------------------------------------------------------------------- Aorta: Aortic root: The aortic root was normal in size.  ------------------------------------------------------------------- Mitral valve: A bioprosthesis was present. Doppler: There was no regurgitation.  Peak gradient (D): 8 mm Hg.  ------------------------------------------------------------------- Left atrium: The atrium was mildly dilated.  ------------------------------------------------------------------- Right ventricle: The cavity size was normal. Systolic function was normal.  ------------------------------------------------------------------- Pulmonic valve:  Doppler: Transvalvular velocity was within the normal  range. There was no evidence for stenosis.  ------------------------------------------------------------------- Tricuspid valve:  Structurally normal valve.  Doppler: Transvalvular velocity was within the normal range. There was trivial regurgitation.  ------------------------------------------------------------------- Pulmonary artery:  Systolic pressure was within the normal range.  ------------------------------------------------------------------- Right atrium: The atrium was normal in size.  ------------------------------------------------------------------- Pericardium: There was no pericardial effusion.  ------------------------------------------------------------------- Systemic veins: Inferior vena cava: The vessel was normal in size.   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 I reviewed the pathology with the patient again. We'll continue with follow-up chest x-ray in 3 months, CT of the chest in 6 months. The patient again is very motivated to stop smoking She is making good progress following recent surgery.  She was reminded of the precautions concerning prosthetic valve and endocarditis.    [name] MD      [address] [place] [address] Office [phone]   Beeper [phone]  12/19/2016 9:52 AM

## 2016-12-25 ENCOUNTER — Other Ambulatory Visit (HOSPITAL_COMMUNITY): Payer: Medicaid Other

## 2017-01-06 ENCOUNTER — Other Ambulatory Visit: Payer: Self-pay | Admitting: Family Medicine

## 2017-01-06 DIAGNOSIS — M545 Low back pain, unspecified: Secondary | ICD-10-CM

## 2017-01-06 DIAGNOSIS — G8929 Other chronic pain: Secondary | ICD-10-CM

## 2017-01-13 ENCOUNTER — Other Ambulatory Visit: Payer: Self-pay

## 2017-01-13 ENCOUNTER — Ambulatory Visit (HOSPITAL_COMMUNITY): Payer: Medicaid Other | Attending: Cardiology

## 2017-01-13 DIAGNOSIS — Z87891 Personal history of nicotine dependence: Secondary | ICD-10-CM | POA: Insufficient documentation

## 2017-01-13 DIAGNOSIS — Z01810 Encounter for preprocedural cardiovascular examination: Secondary | ICD-10-CM | POA: Diagnosis not present

## 2017-01-13 DIAGNOSIS — R931 Abnormal findings on diagnostic imaging of heart and coronary circulation: Secondary | ICD-10-CM

## 2017-01-13 DIAGNOSIS — I739 Peripheral vascular disease, unspecified: Secondary | ICD-10-CM | POA: Insufficient documentation

## 2017-01-13 DIAGNOSIS — I081 Rheumatic disorders of both mitral and tricuspid valves: Secondary | ICD-10-CM | POA: Diagnosis not present

## 2017-01-13 DIAGNOSIS — I493 Ventricular premature depolarization: Secondary | ICD-10-CM | POA: Insufficient documentation

## 2017-01-13 DIAGNOSIS — I5189 Other ill-defined heart diseases: Secondary | ICD-10-CM | POA: Diagnosis not present

## 2017-01-13 DIAGNOSIS — I509 Heart failure, unspecified: Secondary | ICD-10-CM | POA: Diagnosis not present

## 2017-01-13 DIAGNOSIS — I110 Hypertensive heart disease with heart failure: Secondary | ICD-10-CM | POA: Diagnosis not present

## 2017-01-13 DIAGNOSIS — I2510 Atherosclerotic heart disease of native coronary artery without angina pectoris: Secondary | ICD-10-CM | POA: Insufficient documentation

## 2017-01-14 ENCOUNTER — Other Ambulatory Visit: Payer: Self-pay | Admitting: *Deleted

## 2017-01-14 DIAGNOSIS — G8918 Other acute postprocedural pain: Secondary | ICD-10-CM

## 2017-01-14 MED ORDER — TRAMADOL HCL 50 MG PO TABS: 50.0000 mg | ORAL_TABLET | Freq: Four times a day (QID) | ORAL | 0 refills | Status: DC | PRN

## 2017-02-01 IMAGING — CR DG CHEST 2V
2 series · 2 of 2 positions shown · non-contrast
Comparison: 10/23/2014 and [REDACTED]

CLINICAL DATA: Coronary artery disease.

EXAM:
CHEST  2 VIEW

[w chest pa]
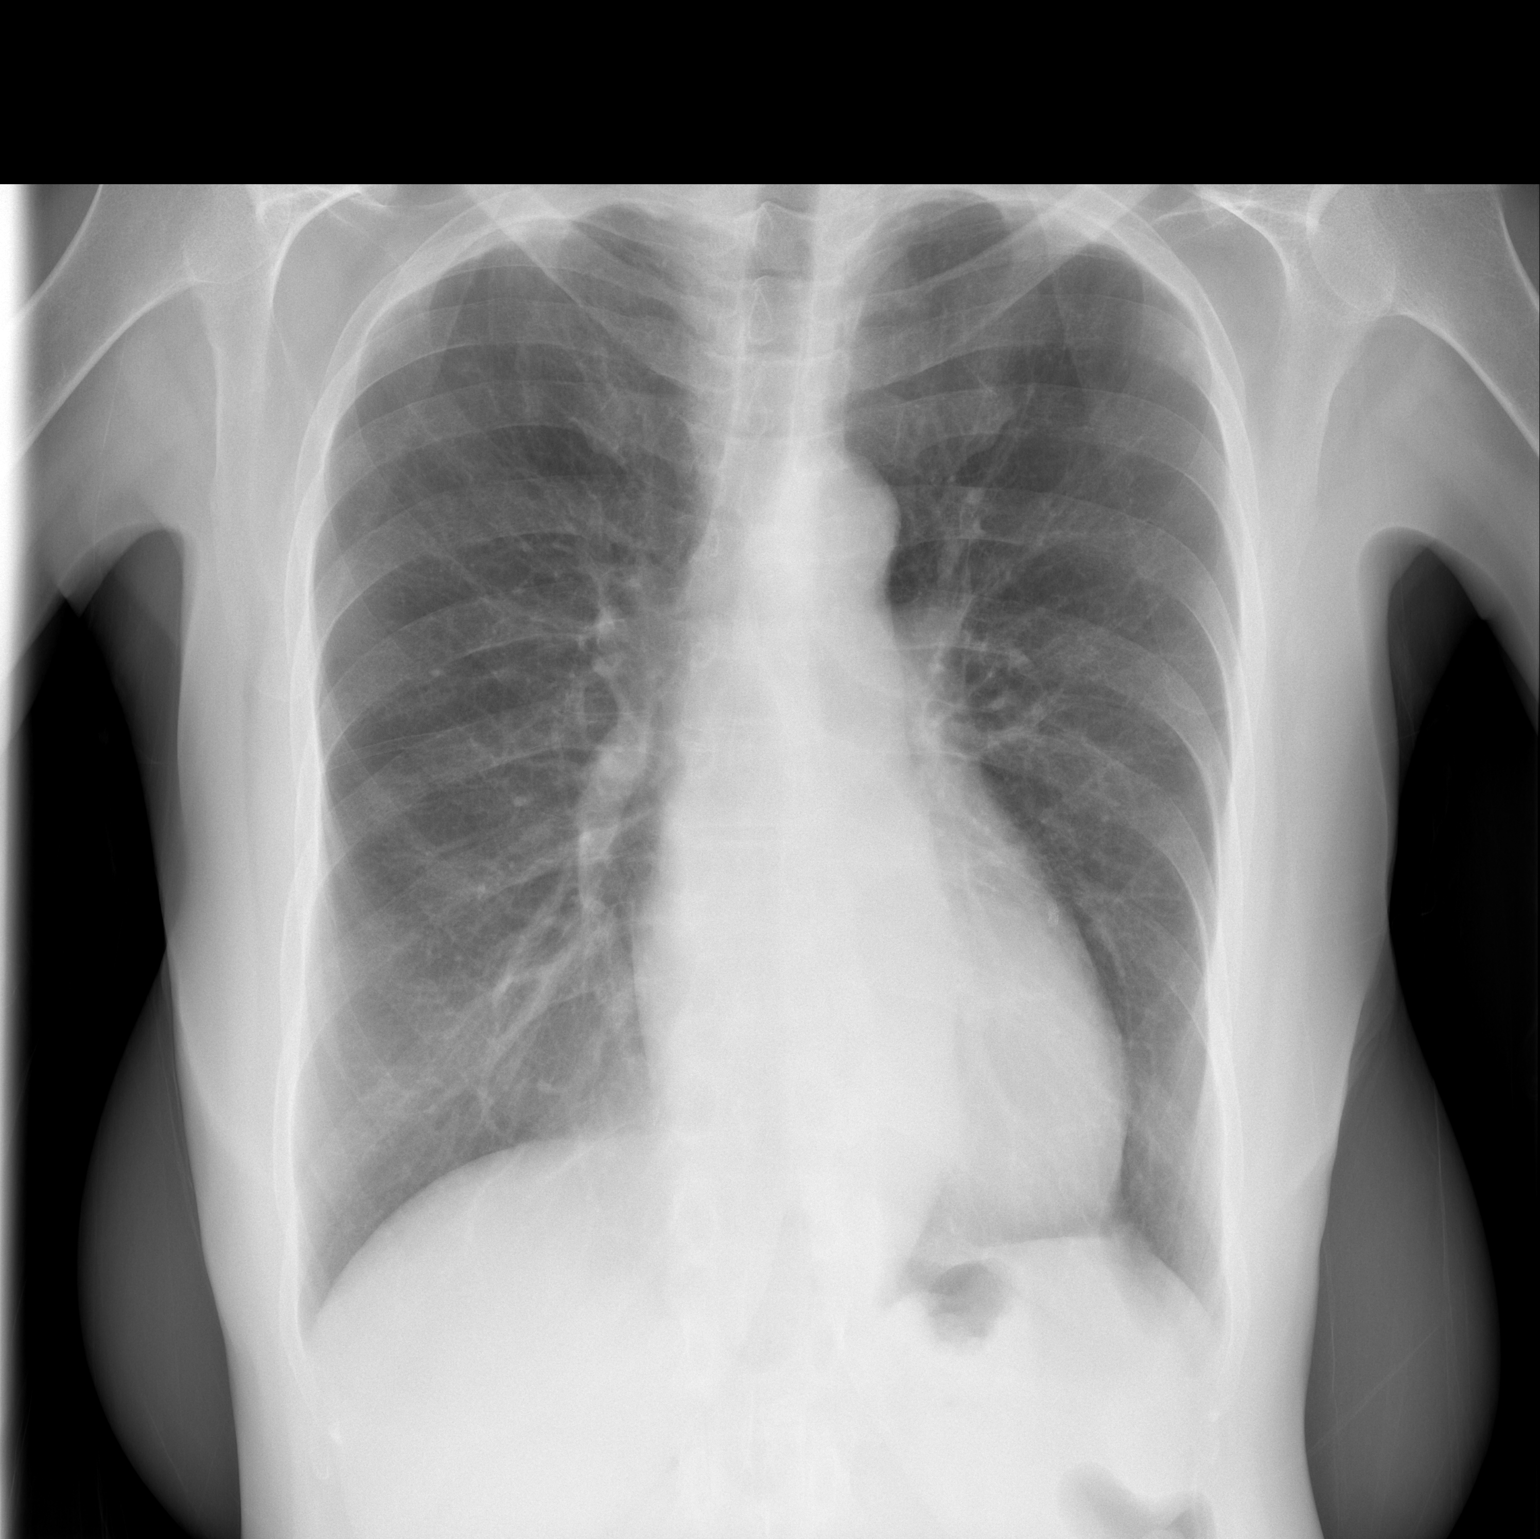

[w chest lat]
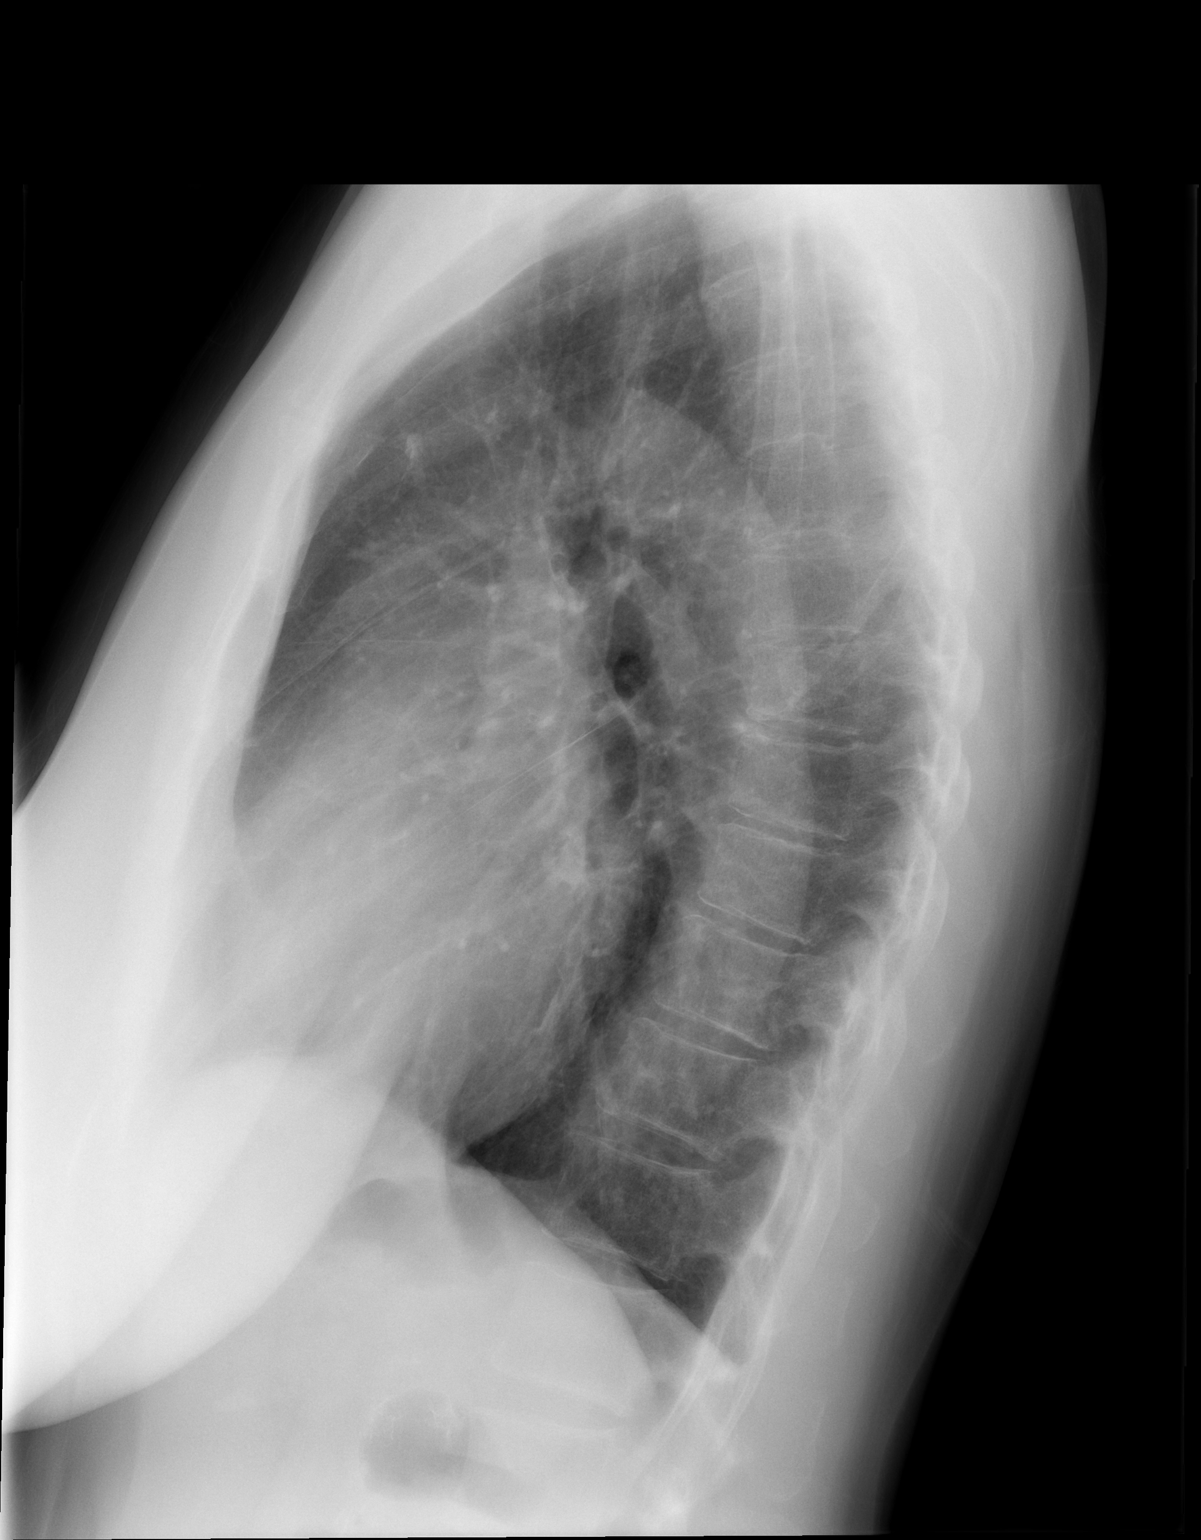

[2 of 2 positions shown; findings below may reference images not displayed]

FINDINGS: Heart size and pulmonary vascularity are normal and the lungs are
clear. Pleural effusions present on the prior exam have resolved. No
osseous abnormality.
IMPRESSION: No active cardiopulmonary disease.

## 2017-02-11 ENCOUNTER — Other Ambulatory Visit: Payer: Self-pay | Admitting: Family Medicine

## 2017-03-19 ENCOUNTER — Other Ambulatory Visit: Payer: Self-pay | Admitting: Cardiothoracic Surgery

## 2017-03-19 DIAGNOSIS — C3491 Malignant neoplasm of unspecified part of right bronchus or lung: Secondary | ICD-10-CM

## 2017-03-20 ENCOUNTER — Ambulatory Visit
Admission: RE | Admit: 2017-03-20 | Discharge: 2017-03-20 | Disposition: A | Discharge status: Home / Self Care | Payer: Medicaid Other | Source: Ambulatory Visit | Attending: Cardiothoracic Surgery | Admitting: Cardiothoracic Surgery

## 2017-03-20 ENCOUNTER — Ambulatory Visit: Payer: Medicaid Other | Admitting: Cardiothoracic Surgery

## 2017-03-20 ENCOUNTER — Other Ambulatory Visit: Payer: Self-pay

## 2017-03-20 ENCOUNTER — Encounter: Payer: Self-pay | Admitting: Cardiothoracic Surgery

## 2017-03-20 VITALS — BP 120/75 | HR 69 | Resp 18 | Ht 73.5 in | Wt 186.4 lb

## 2017-03-20 DIAGNOSIS — C3411 Malignant neoplasm of upper lobe, right bronchus or lung: Secondary | ICD-10-CM

## 2017-03-20 DIAGNOSIS — Z09 Encounter for follow-up examination after completed treatment for conditions other than malignant neoplasm: Secondary | ICD-10-CM

## 2017-03-20 DIAGNOSIS — C3491 Malignant neoplasm of unspecified part of right bronchus or lung: Secondary | ICD-10-CM

## 2017-03-20 NOTE — Progress Notes (Signed)
[address]       [place] [address]             [phone]                  Caroleena F Odonnel [place] Medical Record #[id number] Date of Birth: 1950-04-01  Referring HW:[id number], Edison Nasuti, MD Primary Cardiology:  Peter Martinique, MD Primary Care:Fletcher, Edison Nasuti, MD  Chief Complaint:  Follow Up Visit  Cancer Staging Bronchogenic lung cancer, right [facility]) Staging form: Lung, AJCC 8th Edition - Pathologic stage from 12/03/2016: Stage IA1 (pT1a, pN0, cM0) - Signed by [name], MD on 12/03/2016  11/29/2016 OPERATIVE REPORT PREOPERATIVE DIAGNOSIS:  Slowly enlarging right upper lobe lung nodule. POSTOPERATIVE DIAGNOSIS:  Slowly enlarging right upper lobe lung nodule, probably inflammatory nodule. PROCEDURES PERFORMED:  Bronchoscopy, right video-assisted thoracoscopy with wedge resection of right upper lobe lung lesion and lymph node sampling. SURGEON:  Lanelle Bal, MD  12/05/2014  OPERATIVE REPORT PREOPERATIVE DIAGNOSES: 1. Severe mitral insufficiency. 2. Aortic insufficiency. 3. Coronary occlusive disease. POSTOPERATIVE DIAGNOSES: 1. Severe mitral insufficiency. 2. Aortic insufficiency. 3. Coronary occlusive disease. 4. Aortic insufficiency, mild. PROCEDURE PERFORMED: Mitral valve replacement with [facility] pericardial tissue valve, model [id number] 29 mm, serial #[id number] and coronary artery bypass grafting x1 with the left internal mammary to the left anterior descending coronary artery. Closure of left atrial appendage. SURGEON: Lanelle Bal, MD.   History of Present Illness:      Patient returns office today in follow-up after resection of right upper lobe lung nodule which was found to be stage I non-small cell carcinoma.  The patient is done well after her lung resection.  She has had no respiratory symptoms.  She continues to be smoke free.  Since last seen an echocardiogram has been done.  Her prosthetic valve  appears to be functioning well, ejection fraction is still in the 40% range      Zubrod Score: At the time of surgery this patient's most appropriate activity status/level should be described as: []     0    Normal activity, no symptoms [x]     1    Restricted in physical strenuous activity but ambulatory, able to do out light work []     2    Ambulatory and capable of self care, unable to do work activities, up and about                 >50 % of waking hours                                                                                   []     3    Only limited self care, in bed greater than 50% of waking hours []     4    Completely disabled, no self care, confined to bed or chair []     5    Moribund  Social History   Tobacco Use  Smoking Status Former Smoker  . Packs/day: 0.50  . Years: 64.00  . Pack years: 17.50  . Types: Cigarettes  . Last attempt to quit: 11/25/2016  . Years since quitting: 64.0  Smokeless Tobacco Never Used  Tobacco Comment   has not started wellbutrin yet       Allergies  Allergen Reactions  . Lisinopril Swelling    Angioedema 06/10/11  . Aspirin Other (See Comments)    Upset stomach  . Chantix [Varenicline]     insomnia  . Penicillins Hives    Has patient had a PCN reaction causing immediate rash, facial/tongue/throat swelling, SOB or lightheadedness with hypotension: Yes Has patient had a PCN reaction causing severe rash involving mucus membranes or skin necrosis: No Has patient had a PCN reaction that required hospitalization: No Has patient had a PCN reaction occurring within the last 10 years: No If all of the above answers are "NO", then may proceed with Cephalosporin use.     Current Outpatient Medications  Medication Sig Dispense Refill  . albuterol (PROVENTIL HFA;VENTOLIN HFA) 108 (90 Base) MCG/ACT inhaler Inhale 2 puffs into the lungs every 6 (six) hours as needed for wheezing or shortness of breath. 1 Inhaler 0  . amLODipine (NORVASC) 10  MG tablet Take 1 tablet (10 mg total) by mouth daily. 90 tablet 3  . aspirin EC 81 MG tablet Take 1 tablet (81 mg total) by mouth daily. 90 tablet 1  . atorvastatin (LIPITOR) 80 MG tablet Take 1 tablet (80 mg total) by mouth daily. 30 tablet 2  . cyclobenzaprine (FLEXERIL) 10 MG tablet Take 1 TABLET BY MOUTH THREE TIMES DAILY AS NEEDED for muscle spasms 60 tablet 0  . metoprolol tartrate (LOPRESSOR) 25 MG tablet Take 1 tablet (25 mg total) by mouth 2 (two) times daily. 60 tablet 2  . nitroGLYCERIN (NITROSTAT) 0.4 MG SL tablet Place 1 tablet (0.4 mg total) under the tongue every 5 (five) minutes x 3 doses as needed for chest pain. 25 tablet 12  . traMADol (ULTRAM) 50 MG tablet Take 1 tablet (50 mg total) by mouth every 6 (six) hours as needed for severe pain. for pain 30 tablet 0  . traZODone (DESYREL) 100 MG tablet take 1 TABLET BY MOUTH at bedtime AS NEEDED for sleep 30 tablet 2   No current facility-administered medications for this visit.        Physical Exam: BP 120/75 (BP Location: Left Arm, Patient Position: Sitting, Cuff Size: Large)   Pulse 69   Resp 18   Ht 6' 1.5" (1.867 m)   Wt 186 lb 6.4 oz (84.6 kg)   SpO2 98% Comment: on RA  BMI 24.26 kg/m   General appearance: alert and cooperative Neurologic: intact Heart: regular rate and rhythm, S1, S2 normal, no murmur, click, rub or gallop Lungs: clear to auscultation bilaterally Abdomen: soft, non-tender; bowel sounds normal; no masses,  no organomegaly Extremities: extremities normal, atraumatic, no cyanosis or edema and Homans sign is negative, no sign of DVT Wound: Sternal incision and right VATS incisions are well-healed   Diagnostic Studies & Laboratory data:         Recent Radiology Findings: Dg Chest 2 View  Result Date: 03/20/2017 CLINICAL DATA:  Bronchogenic lung carcinoma EXAM: CHEST  2 VIEW COMPARISON:  12/19/2016 FINDINGS: previous median sternotomy. Mitral valve replacement noted. Stable cardiomegaly without  CHF or edema. Stable parenchymal scarring and postop changes in the right upper lobe. Lungs otherwise clear. No focal pneumonia, collapse or consolidation. No effusion or pneumothorax. Trachea is midline. IMPRESSION: Stable postoperative findings. No interval change or superimposed acute process. Electronically Signed   By: Jerilynn Mages.  Shick M.D.   On: 03/20/2017 08:26  I have independently reviewed the above radiology findings and reviewed findings  with the patient.  Recent Labs: Lab Results  Component Value Date   WBC 13.9 (H) 12/02/2016   HGB 12.0 12/02/2016   HCT 37.2 12/02/2016   PLT 123 (L) 12/02/2016   GLUCOSE 100 (H) 12/02/2016   CHOL 246 (H) 11/18/2016   TRIG 96 11/18/2016   HDL 51 11/18/2016   LDLDIRECT 185 (A) 09/09/2011   LDLCALC 176 (H) 11/18/2016   ALT 13 (L) 12/01/2016   AST 18 12/01/2016   NA 139 12/02/2016   K 3.6 12/02/2016   CL 106 12/02/2016   CREATININE 0.83 12/02/2016   BUN 9 12/02/2016   CO2 27 12/02/2016   TSH 1.756 11/28/2014   INR 1.03 11/29/2016   HGBA1C 5.6 01/15/2016    Echo: 12/2016 Result status: Final result                           *[facility]*                        [address], [place] [address]                            [phone]  ------------------------------------------------------------------- Transthoracic Echocardiography  Patient:    Jurline, Folger MR #:       [id number] Study Date: 01/13/2017 Gender:     F Age:        19 Height:     185.4 cm Weight:     80.3 kg BSA:        2.03 m^2 Pt. Status: Room:   SONOGRAPHER  Wyatt Mage, [place], Outpatient  ATTENDING    Ahmed Prima, Mancel Bale, [place], [place] M  cc:  ------------------------------------------------------------------- LV EF: 40%  ------------------------------------------------------------------- Indications:      Pre-op exam  (Z01.81).  ------------------------------------------------------------------- History:   PMH:   Coronary artery disease.  Congestive heart failure.  Mitral valve disease.  Risk factors:  PVD. PVC. Former tobacco use. Hypertension.  ------------------------------------------------------------------- Study Conclusions  - Left ventricle: The cavity size was normal. Wall thickness was   normal. Systolic function was mildly to moderately reduced. The   estimated ejection fraction was 40%. Diffuse hypokinesis. There   was a reduced contribution of atrial contraction to ventricular   filling, due to increased ventricular diastolic pressure or   atrial contractile dysfunction. Doppler parameters are consistent   with high ventricular filling pressure. - Aortic valve: There was mild regurgitation. - Mitral valve: A bioprosthesis was present. with normal function.   Mean gradient (D): 2 mm Hg. Peak gradient (D): 10 mm Hg. - Left atrium: The atrium was moderately dilated. - Pulmonary arteries: PA peak pressure: 38 mm Hg (S).  ------------------------------------------------------------------- Labs, prior tests, procedures, and surgery: Transthoracic echocardiography (03/10/2015).     EF was 45%.  Valve surgery.     Mitral valve replacement with a bioprosthetic valve.  ------------------------------------------------------------------- Study data:  Comparison was made to the study of 03/10/2015.  Study status:  Routine.  Procedure:  The patient reported no pain pre or post test. Transthoracic echocardiography. Image quality was adequate.  Study completion:  There were no complications. Transthoracic echocardiography.  M-mode, complete 2D, 3D, spectral Doppler, and color Doppler.  Birthdate:  Patient birthdate: [birth year].  Age:  Patient is 66 yr old.  Sex:  Gender: female. BMI: 23.4 kg/m^2.  Blood pressure:     151/78  Patient status: Outpatient.  Study date:  Study date:  01/13/2017. Study time: 09:09 AM.  Location:  [facility]  -------------------------------------------------------------------  ------------------------------------------------------------------- Left ventricle:  The cavity size was normal. Wall thickness was normal. Systolic function was mildly to moderately reduced. The estimated ejection fraction was 40%. Diffuse hypokinesis. There was a reduced contribution of atrial contraction to ventricular filling, due to increased ventricular diastolic pressure or atrial contractile dysfunction. Doppler parameters are consistent with high ventricular filling pressure.  ------------------------------------------------------------------- Aortic valve:   Trileaflet; normal thickness leaflets. Mobility was not restricted.  Doppler:  Transvalvular velocity was within the normal range. There was no stenosis. There was mild regurgitation.   ------------------------------------------------------------------- Aorta:  The aorta was normal, not dilated, and non-diseased. Aortic root: The aortic root was normal in size.  ------------------------------------------------------------------- Mitral valve:  A bioprosthesis was present. with normal function. Mobility was not restricted.  Doppler:  Transvalvular velocity was within the normal range. There was no evidence for stenosis. mean gradient 2 mm Hg. There was no regurgitation.    Valve area by pressure half-time: 2.37 cm^2. Indexed valve area by pressure half-time: 1.17 cm^2/m^2. Valve area by continuity equation (using LVOT flow): 1.97 cm^2. Indexed valve area by continuity equation (using LVOT flow): 0.97 cm^2/m^2.    Mean gradient (D): 2 mm Hg. Peak gradient (D): 10 mm Hg.  ------------------------------------------------------------------- Left atrium:  The atrium was moderately dilated.  ------------------------------------------------------------------- Atrial septum:  The septum  was normal.  ------------------------------------------------------------------- Pulmonary veins: Common pulmonary vein:  The Doppler velocity and flow profile were normal.  ------------------------------------------------------------------- Right ventricle:  The cavity size was normal. Wall thickness was normal. Systolic function was normal.  ------------------------------------------------------------------- Pulmonic valve:    Structurally normal valve.   Cusp separation was normal.  Doppler:  Transvalvular velocity was within the normal range. There was no evidence for stenosis. There was no regurgitation.  ------------------------------------------------------------------- Tricuspid valve:   Structurally normal valve.   Leaflet separation was normal.  Doppler:  Transvalvular velocity was within the normal range. There was mild regurgitation.  ------------------------------------------------------------------- Pulmonary artery:   The main pulmonary artery was normal-sized. Systolic pressure was within the normal range.  ------------------------------------------------------------------- Right atrium:  The atrium was normal in size.  ------------------------------------------------------------------- Pericardium:  The pericardium was normal in appearance. There was no pericardial effusion.  ------------------------------------------------------------------- Systemic veins: Inferior vena cava: The vessel was normal in size. The respirophasic diameter changes were in the normal range (= 50%), consistent with normal central venous pressure.  ------------------------------------------------------------------- Post procedure conclusions Ascending Aorta:  - The aorta was normal, not dilated, and non-diseased.  ------------------------------------------------------------------- Measurements   Left ventricle                            Value          Reference   LV ID, ED, PLAX chordal                   49.6  mm       43 - 52  LV ID, ES, PLAX chordal           (H)     42  mm       23 - 38  LV fx shortening, PLAX chordal    (L)     15    %        >=29  LV PW thickness, ED                       11.1  mm       ---------  IVS/LV PW ratio, ED                       1.02           <=1.3  Stroke volume, 2D                         58    ml       ---------  Stroke volume/bsa, 2D                     29    ml/m^2   ---------  LV e&', lateral                            3.43  cm/s     ---------  LV E/e&', lateral                          47.23          ---------  LV e&', medial                             4.6   cm/s     ---------  LV E/e&', medial                           35.22          ---------  LV e&', average                            4.02  cm/s     ---------  LV E/e&', average                          40.35          ---------    Ventricular septum                        Value          Reference  IVS thickness, ED                         11.3  mm       ---------    LVOT                                      Value          Reference  LVOT ID, S                                20    mm       ---------  LVOT area                                 3.14  cm^2     ---------  LVOT peak velocity, S                     96.4  cm/s     ---------  LVOT mean velocity, S                     57    cm/s     ---------  LVOT VTI, S                               18.6  cm       ---------    Aortic valve                              Value          Reference  Aortic regurg pressure half-time          504   ms       ---------    Aorta                                     Value          Reference  Aortic root ID, ED                        31    mm       ---------  Ascending aorta ID, A-P, S                30    mm       ---------    Left atrium                               Value          Reference  LA ID, A-P, ES                            48    mm       ---------  LA  ID/bsa, A-P                    (H)     2.36  cm/m^2   <=2.2  LA volume, S                              61    ml       ---------  LA volume/bsa, S                          30    ml/m^2   ---------  LA volume, ES, 1-p A4C                    54.6  ml       ---------  LA volume/bsa, ES, 1-p A4C  26.8  ml/m^2   ---------  LA volume, ES, 1-p A2C                    66    ml       ---------  LA volume/bsa, ES, 1-p A2C                32.4  ml/m^2   ---------    Mitral valve                              Value          Reference  Mitral E-wave peak velocity               162   cm/s     ---------  Mitral A-wave peak velocity               60.4  cm/s     ---------  Mitral mean velocity, D                   54.8  cm/s     ---------  Mitral deceleration time          (H)     268   ms       150 - 230  Mitral pressure half-time                 93    ms       ---------  Mitral mean gradient, D                   2     mm Hg    ---------  Mitral peak gradient, D                   10    mm Hg    ---------  Mitral E/A ratio, peak                    2.7            ---------  Mitral valve area, PHT, DP                2.37  cm^2     ---------  Mitral valve area/bsa, PHT, DP            1.17  cm^2/m^2 ---------  Mitral valve area, LVOT                   1.97  cm^2     ---------  continuity  Mitral valve area/bsa, LVOT               0.97  cm^2/m^2 ---------  continuity  Mitral annulus VTI, D                     29.6  cm       ---------    Pulmonary arteries                        Value          Reference  PA pressure, S, DP                (H)     38    mm Hg    <=30    Tricuspid valve  Value          Reference  Tricuspid regurg peak velocity            276   cm/s     ---------  Tricuspid peak RV-RA gradient             30    mm Hg    ---------    Systemic veins                            Value          Reference  Estimated CVP                             8     mm Hg     ---------    Right ventricle                           Value          Reference  TAPSE                                     17    mm       ---------  RV pressure, S, DP                (H)     38    mm Hg    <=30  RV s&', lateral, S                         6.02  cm/s     ---------  Legend: (L)  and  (H)  mark values outside specified reference range.  ------------------------------------------------------------------- Prepared and Electronically Authenticated by  Shirlee More, MD 2018-10-23T12:29:17    Assessment / Plan:   Stable following wedge resection of right upper lobe stage IA non-small cell carcinoma of the lung-we will plan 51-month follow-up CT scan from the time of surgery, 3 months from now Patient is stable after mitral valve replacement and coronary artery bypass grafting in 2016 with stable depressed ejection fraction at 40% by recent echo      [name] 03/20/2017 9:42 AM

## 2017-04-07 ENCOUNTER — Other Ambulatory Visit: Payer: Self-pay | Admitting: *Deleted

## 2017-04-07 DIAGNOSIS — G8918 Other acute postprocedural pain: Secondary | ICD-10-CM

## 2017-04-08 ENCOUNTER — Other Ambulatory Visit: Payer: Self-pay | Admitting: Family Medicine

## 2017-04-10 ENCOUNTER — Other Ambulatory Visit: Payer: Self-pay | Admitting: Family Medicine

## 2017-04-10 DIAGNOSIS — G8918 Other acute postprocedural pain: Secondary | ICD-10-CM

## 2017-04-10 MED ORDER — TRAMADOL HCL 50 MG PO TABS: 50.0000 mg | ORAL_TABLET | Freq: Four times a day (QID) | ORAL | 0 refills | Status: DC | PRN

## 2017-04-10 NOTE — Progress Notes (Signed)
Have refilled prescription and placed under "W" folder at front desk. Please call the patient and let her know this is ready.  Guadalupe Dawn MD PGY-1 Family Medicine Resident

## 2017-04-10 NOTE — Telephone Encounter (Signed)
Family Medicine Progress note  I have placed a printed prescription for tramadol under the "W" folder at the front desk. Please let the patient know this is ready.  Guadalupe Dawn MD PGY-1 Family Medicine Resident

## 2017-04-13 NOTE — Progress Notes (Signed)
Cardiology Office Note    Date:  04/14/2017   ID:  Leah [name], [place] 1950-07-25, MRN [id number]  PCP:  Guadalupe Dawn, MD  Cardiologist: Dr. Martinique  Chief Complaint  Patient presents with  . Shortness of Breath  . Congestive Heart Failure    History of Present Illness:    Leah [name] is a 67 y.o. female with past medical history of CAD (s/p LIMA-LAD in 11/2014), MVR (in 11/2014 with a pericardial tissue valve), HTN, HLD, COPD, and PVD (s/p L CEA and right external iliac stenting), CHF who is seen today for follow up.   In August she had pre op evaluation with a Myoview study that showed no ischemic and reduced EF. She had a right upper lobe lung nodule which had increased in size. She underwent resection in September 2018 with pathology showing adenocarcinoma stage 1. Negative margins. Echo performed in October showed EF 40% with normally functioning Mitral valve prosthesis.   She reports that the does get SOB if she walks for a long period or if she has to go up and down stairs frequently. She sometimes has swelling in her feet. She denies orthopnea, PND, cough. Still has some soreness in right thoracotomy site. She admits she hasn't been taking lipitor regularly. She denies any claudication, TIA, or CVA symptoms. It has been years since she was seen by vascular. She continues to smoke. PFTs in August showed mild COPD with reduced diffusion capacity.    Past Medical History:  Diagnosis Date  . Anemia   . Bronchogenic lung cancer, right ([place]) 11/29/2016  . CAD (coronary artery disease)    a. s/p LIMA-LAD in 11/2014  . Carotid artery occlusion   . Complication of anesthesia    slow to awaken x 1 maybe 2001  . COPD (chronic obstructive pulmonary disease) ([place])   . GERD (gastroesophageal reflux disease)    "sometimes" takes Copywriter, advertising or drinks gingerale   . Headache(784.0)   . History of kidney stones   . Hyperlipidemia   . Hypertension   . Leg pain   .  Peripheral vascular disease ([place])   . Renal vascular disease 10/26/2014   bilateral stents placed   . Severe mitral regurgitation    a. s/p MVR in 11/2014 with a pericardial tissue valve  . Shortness of breath dyspnea   . Tobacco abuse     Past Surgical History:  Procedure Laterality Date  . ABDOMINAL HYSTERECTOMY    . ANGIOPLASTY / STENTING ILIAC  2010   right external iliac by Dr. Irish Lack  . CARDIAC CATHETERIZATION  11/13/11   Left Heart Cath. with Coronary Angiogram  . CARDIAC CATHETERIZATION N/A 08/24/2014   Procedure: Left Heart Cath and Coronary Angiography;  Surgeon: Wellington Hampshire, MD;  Location: [facility];  Service: Cardiovascular;  Laterality: N/A;  . CARDIAC CATHETERIZATION N/A 10/26/2014   Procedure: Right/Left Heart Cath and Coronary Angiography;  Surgeon: Jaspreet Bodner M Martinique, MD; oLAD 70%, mLAD 70% ISR, D2 30%, OFC 30%, RCA 20%, EF nl, low R heart pressures after diuresis, severe MR  . CAROTID ENDARTERECTOMY  11/21/2007   left  . COLONOSCOPY W/ POLYPECTOMY    . CORONARY ANGIOPLASTY WITH STENT PLACEMENT  6/09   LAD 2.5x12 Promus  . CORONARY ARTERY BYPASS GRAFT N/A 12/05/2014   Procedure: CORONARY ARTERY BYPASS GRAFTING (CABG);  Surgeon: [name], MD;  Location: [facility];  Service: Open Heart Surgery;  Laterality: N/A;  Times 1 using left internal mammary  artery to LAD  . FEMORAL-POPLITEAL BYPASS GRAFT  10/12   left Dr. Kellie Simmering  . MITRAL VALVE REPAIR  2016  . MITRAL VALVE REPLACEMENT N/A 12/05/2014   Procedure: MITRAL VALVE (MV) REPLACEMENT;  Surgeon: [name], MD;  Location: [facility];  Service: Open Heart Surgery;  Laterality: N/A;  Closure left atrial appendage  . RENAL ARTERY STENT Bilateral   . TEE WITHOUT CARDIOVERSION N/A 10/24/2014   Procedure: TRANSESOPHAGEAL ECHOCARDIOGRAM (TEE);  Surgeon: Thayer Headings, MD;  Location: [facility];  Service: Cardiovascular;  Laterality: N/A;  . TEE WITHOUT CARDIOVERSION N/A 12/05/2014   Procedure: TRANSESOPHAGEAL  ECHOCARDIOGRAM (TEE);  Surgeon: [name], MD;  Location: [facility];  Service: Open Heart Surgery;  Laterality: N/A;  . VIDEO ASSISTED THORACOSCOPY (VATS)/WEDGE RESECTION Right 11/29/2016   Procedure: VIDEO ASSISTED THORACOSCOPY (VATS)/ RUL WEDGE RESECTION OF LESION/ NODE SAMPLING;  Surgeon: [name], MD;  Location: [facility];  Service: Thoracic;  Laterality: Right;  Marland Kitchen VIDEO BRONCHOSCOPY N/A 11/29/2016   Procedure: VIDEO BRONCHOSCOPY;  Surgeon: [name], MD;  Location: [facility];  Service: Thoracic;  Laterality: N/A;    Current Medications: Outpatient Medications Prior to Visit  Medication Sig Dispense Refill  . albuterol (PROVENTIL HFA;VENTOLIN HFA) 108 (90 Base) MCG/ACT inhaler Inhale 2 puffs into the lungs every 6 (six) hours as needed for wheezing or shortness of breath. 1 Inhaler 0  . aspirin EC 81 MG tablet Take 1 tablet (81 mg total) by mouth daily. 90 tablet 1  . cyclobenzaprine (FLEXERIL) 10 MG tablet Take 1 TABLET BY MOUTH THREE TIMES DAILY AS NEEDED for muscle spasms 60 tablet 0  . nitroGLYCERIN (NITROSTAT) 0.4 MG SL tablet Place 1 tablet (0.4 mg total) under the tongue every 5 (five) minutes x 3 doses as needed for chest pain. 25 tablet 12  . traMADol (ULTRAM) 50 MG tablet Take 1 tablet (50 mg total) by mouth every 6 (six) hours as needed for severe pain. for pain 30 tablet 0  . traZODone (DESYREL) 100 MG tablet take 1 TABLET BY MOUTH at bedtime AS NEEDED for sleep 30 tablet 2  . amLODipine (NORVASC) 10 MG tablet Take 1 tablet (10 mg total) by mouth daily. 90 tablet 3  . atorvastatin (LIPITOR) 80 MG tablet Take 1 TABLET BY MOUTH EVERY DAY 30 tablet 2  . metoprolol tartrate (LOPRESSOR) 25 MG tablet Take 1 tablet (25 mg total) by mouth 2 (two) times daily. 60 tablet 2   No facility-administered medications prior to visit.      Allergies:   Lisinopril; Aspirin; Chantix [varenicline]; and Penicillins   Social History   Socioeconomic History  . Marital status: Divorced      Spouse name: None  . Number of children: 4  . Years of education: None  . Highest education level: None  Social Needs  . Financial resource strain: None  . Food insecurity - worry: None  . Food insecurity - inability: None  . Transportation needs - medical: None  . Transportation needs - non-medical: None  Occupational History  . Occupation: retired  Tobacco Use  . Smoking status: Former Smoker    Packs/day: 0.50    Years: 35.00    Pack years: 17.50    Types: Cigarettes    Last attempt to quit: 11/25/2016    Years since quitting: 0.3  . Smokeless tobacco: Never Used  . Tobacco comment: has not started wellbutrin yet  Substance and Sexual Activity  . Alcohol use: No  Alcohol/week: 0.0 oz  . Drug use: No  . Sexual activity: No  Other Topics Concern  . None  Social History Narrative  . None     Family History:  The patient's family history includes ALS in her brother; Cancer in her mother; Heart disease in her father; Hypertension in her brother, father, and mother; Kidney disease in her father; Prostate cancer in her father; Stroke in her paternal aunt.   Review of Systems:   Please see the history of present illness.     All other systems reviewed and are otherwise negative except as noted above.   Physical Exam:    VS:  BP 118/86   Pulse 68   Ht 6\' 1"  (1.854 m)   Wt 180 lb 4 oz (81.8 kg)   BMI 23.78 kg/m    General: Well developed, well nourished [place] American female appearing in no acute distress. Head: Normocephalic, atraumatic, sclera non-icteric, no xanthomas, nares are without discharge.  Neck: Bilateral carotid bruits. JVD not elevated.  Lungs: Clear Heart: Regular rate and rhythm. No S3 or S4.  No murmur, no rubs, or gallops appreciated. Abdomen: Soft, non-tender, non-distended with normoactive bowel sounds. No hepatomegaly. No rebound/guarding. No obvious abdominal masses. Msk:  Strength and tone appear normal for age. No joint deformities or  effusions. Extremities: No clubbing or cyanosis. No lower extremity edema.  Distal pedal pulses reduced on the left Neuro: Alert and oriented X 3. Moves all extremities spontaneously. No focal deficits noted. Psych:  Responds to questions appropriately with a normal affect. Skin: No rashes or lesions noted  Wt Readings from Last 3 Encounters:  04/14/17 180 lb 4 oz (81.8 kg)  03/20/17 186 lb 6.4 oz (84.6 kg)  12/19/16 177 lb (80.3 kg)     Studies/Labs Reviewed:   EKG:  EKG is not ordered today.    Recent Labs: 12/01/2016: ALT 13 12/02/2016: BUN 9; Creatinine, Ser 0.83; Hemoglobin 12.0; Platelets 123; Potassium 3.6; Sodium 139   Lipid Panel    Component Value Date/Time   CHOL 246 (H) 11/18/2016 0844   TRIG 96 11/18/2016 0844   HDL 51 11/18/2016 0844   CHOLHDL 4.8 (H) 11/18/2016 0844   CHOLHDL 5.6 (H) 01/15/2016 1117   VLDL 24 01/15/2016 1117   LDLCALC 176 (H) 11/18/2016 0844   LDLDIRECT 185 (A) 09/09/2011 0820    Additional studies/ records that were reviewed today include:   Myoview 11/20/16: Study Highlights    Nuclear stress EF: 31%. The left ventricular ejection fraction is moderately decreased (30-44%).  Defect 1: There is a medium defect of moderate severity present in the apical anterior, apical septal, apical inferior and apex location.  Findings consistent with prior apical myocardial infarction.  This is a high risk study based on severely reduced LV function . There is no evidence of ischemia   Echo 01/13/17: Study Conclusions  - Left ventricle: The cavity size was normal. Wall thickness was   normal. Systolic function was mildly to moderately reduced. The   estimated ejection fraction was 40%. Diffuse hypokinesis. There   was a reduced contribution of atrial contraction to ventricular   filling, due to increased ventricular diastolic pressure or   atrial contractile dysfunction. Doppler parameters are consistent   with high ventricular filling pressure. -  Aortic valve: There was mild regurgitation. - Mitral valve: A bioprosthesis was present. with normal function.   Mean gradient (D): 2 mm Hg. Peak gradient (D): 10 mm Hg. - Left atrium: The atrium was  moderately dilated. - Pulmonary arteries: PA peak pressure: 38 mm Hg (S).   Assessment:    1. Chronic systolic CHF (congestive heart failure) ([place])   2. Hyperlipidemia, unspecified hyperlipidemia type   3. S/P MVR (mitral valve replacement)   4. Coronary artery disease involving native coronary artery of native heart without angina pectoris   5. PVD- s/p multiple proceedures   6. Bilateral carotid artery stenosis   7. Essential hypertension, benign      Plan:   In order of problems listed above:  1. CAD - s/p LIMA-LAD in 11/2014.  - she denies any recent chest pain on exertion. Has baseline dyspnea.  -  Lexiscan Myoview in August showed no ischemia but reduced EF   - continue ASA, statin, and BB therapy.   2. S/p MVR - performed in in 11/2014 with a pericardial tissue valve. Echo in August 2018 showed a stable prosthesis with no obvious MR.  - No significant murmur appreciated on examination today.   3. Chronic systolic CHF EF 21%. In review of records she had EF 45-50% post MVR. "normal" EF prior to surgery but I suspect there was latent LV dysfunction in setting of severe MR. She has class 1-2 symptoms. I would like to optimize her medical therapy for CHF. She is not a candidate for ACEi or ARB due to history of angioedema on lisinopril. Will switch amlodipine to Bidil 20/37.5 mg tid. Continue metoprolol. May consider addition of aldactone later. Consider repeat Echo once on optimal therapy. Stressed importance of compliance. I will follow up in 2 months.  4. HTN - BP is well controlled now. Change amlodipine to Bidil as noted above  5. HLD - Lipid Panel in august showed persistently high LDL which reflects noncompliance with Lipitor. Encourage compliance and will repeat fasting  lab in 2 months.   6. PVD -  s/p L CEA and right external iliac stenting. Bilateral carotid bruits. Hasn't seen Vascular surgery in years. Will update Carotid and Lower extremity arterial dopplers.  - continue ASA and statin therapy.   7. Tobacco Use - still smoking 0.5 ppd. Encouraged complete smoking cessation  8. Adenocarcinoma lung. S/p resection. Stage 1.    Signed, Lyrika Souders Martinique, MD  04/14/2017 9:26 AM    [facility] [place], [place], [place]  [address] Phone: [phone]; Fax: [phone]  [address] [place], [place] [address] Phone: [phone]

## 2017-04-14 ENCOUNTER — Encounter: Payer: Self-pay | Admitting: Cardiology

## 2017-04-14 ENCOUNTER — Other Ambulatory Visit: Payer: Self-pay | Admitting: Family Medicine

## 2017-04-14 ENCOUNTER — Ambulatory Visit (INDEPENDENT_AMBULATORY_CARE_PROVIDER_SITE_OTHER): Payer: Medicaid Other | Admitting: Cardiology

## 2017-04-14 VITALS — BP 118/86 | HR 68 | Ht 73.0 in | Wt 180.2 lb

## 2017-04-14 DIAGNOSIS — Z952 Presence of prosthetic heart valve: Secondary | ICD-10-CM | POA: Diagnosis not present

## 2017-04-14 DIAGNOSIS — I5022 Chronic systolic (congestive) heart failure: Secondary | ICD-10-CM

## 2017-04-14 DIAGNOSIS — I10 Essential (primary) hypertension: Secondary | ICD-10-CM | POA: Diagnosis not present

## 2017-04-14 DIAGNOSIS — M545 Low back pain, unspecified: Secondary | ICD-10-CM

## 2017-04-14 DIAGNOSIS — G8929 Other chronic pain: Secondary | ICD-10-CM

## 2017-04-14 DIAGNOSIS — I2510 Atherosclerotic heart disease of native coronary artery without angina pectoris: Secondary | ICD-10-CM | POA: Diagnosis not present

## 2017-04-14 DIAGNOSIS — I6523 Occlusion and stenosis of bilateral carotid arteries: Secondary | ICD-10-CM

## 2017-04-14 DIAGNOSIS — E785 Hyperlipidemia, unspecified: Secondary | ICD-10-CM | POA: Diagnosis not present

## 2017-04-14 DIAGNOSIS — I739 Peripheral vascular disease, unspecified: Secondary | ICD-10-CM

## 2017-04-14 MED ORDER — ISOSORB DINITRATE-HYDRALAZINE 20-37.5 MG PO TABS: 1.0000 | ORAL_TABLET | Freq: Three times a day (TID) | ORAL | 11 refills | Status: DC

## 2017-04-14 MED ORDER — METOPROLOL TARTRATE 25 MG PO TABS: 25.0000 mg | ORAL_TABLET | Freq: Two times a day (BID) | ORAL | 11 refills | Status: DC

## 2017-04-14 MED ORDER — ATORVASTATIN CALCIUM 80 MG PO TABS: 80.0000 mg | ORAL_TABLET | Freq: Every day | ORAL | 11 refills | Status: DC

## 2017-04-14 NOTE — Patient Instructions (Signed)
Stop smoking!!  Stop taking amlodipine  Start Bidil 20/37.5 mg three times a day  Continue your other medication. Be sure to take lipitor daily  We will see you in 2 months with fasting lab work  We will schedule you for lower extremity and carotid dopplers.

## 2017-04-15 ENCOUNTER — Other Ambulatory Visit: Payer: Self-pay | Admitting: *Deleted

## 2017-04-15 DIAGNOSIS — G8918 Other acute postprocedural pain: Secondary | ICD-10-CM

## 2017-04-15 NOTE — Telephone Encounter (Signed)
Error

## 2017-04-16 NOTE — Telephone Encounter (Signed)
Pt already informed that she had to pick up rx, will pick it up tomorrow. Deseree Kennon Holter, CMA

## 2017-04-17 ENCOUNTER — Other Ambulatory Visit: Payer: Self-pay | Admitting: Cardiology

## 2017-04-17 DIAGNOSIS — I739 Peripheral vascular disease, unspecified: Secondary | ICD-10-CM

## 2017-04-17 DIAGNOSIS — I6523 Occlusion and stenosis of bilateral carotid arteries: Secondary | ICD-10-CM

## 2017-04-23 ENCOUNTER — Ambulatory Visit (HOSPITAL_BASED_OUTPATIENT_CLINIC_OR_DEPARTMENT_OTHER)
Admission: RE | Admit: 2017-04-23 | Discharge: 2017-04-23 | Disposition: A | Discharge status: Home / Self Care | Payer: Medicaid Other | Source: Ambulatory Visit | Attending: Cardiovascular Disease | Admitting: Cardiovascular Disease

## 2017-04-23 ENCOUNTER — Ambulatory Visit (HOSPITAL_COMMUNITY)
Admission: RE | Admit: 2017-04-23 | Discharge: 2017-04-23 | Disposition: A | Discharge status: Home / Self Care | Payer: Medicaid Other | Source: Ambulatory Visit | Attending: Cardiovascular Disease | Admitting: Cardiovascular Disease

## 2017-04-23 DIAGNOSIS — I6523 Occlusion and stenosis of bilateral carotid arteries: Secondary | ICD-10-CM

## 2017-04-23 DIAGNOSIS — I739 Peripheral vascular disease, unspecified: Secondary | ICD-10-CM

## 2017-04-23 DIAGNOSIS — I708 Atherosclerosis of other arteries: Secondary | ICD-10-CM | POA: Insufficient documentation

## 2017-04-23 DIAGNOSIS — I700 Atherosclerosis of aorta: Secondary | ICD-10-CM | POA: Diagnosis not present

## 2017-04-24 ENCOUNTER — Other Ambulatory Visit: Payer: Self-pay

## 2017-04-24 DIAGNOSIS — Z9861 Coronary angioplasty status: Principal | ICD-10-CM

## 2017-04-24 DIAGNOSIS — I2510 Atherosclerotic heart disease of native coronary artery without angina pectoris: Secondary | ICD-10-CM

## 2017-04-24 DIAGNOSIS — I5033 Acute on chronic diastolic (congestive) heart failure: Secondary | ICD-10-CM

## 2017-04-24 DIAGNOSIS — I739 Peripheral vascular disease, unspecified: Secondary | ICD-10-CM

## 2017-05-05 ENCOUNTER — Other Ambulatory Visit: Payer: Self-pay | Admitting: Family Medicine

## 2017-05-07 ENCOUNTER — Telehealth: Payer: Self-pay

## 2017-05-07 NOTE — Telephone Encounter (Signed)
Patient left message on nurse line to state that her cardiologist took her off Amlodipine and she wanted Dr Kris Mouton to know. Danley Danker, RN [facility] RN)

## 2017-05-09 ENCOUNTER — Other Ambulatory Visit: Payer: Self-pay | Admitting: Cardiothoracic Surgery

## 2017-05-09 DIAGNOSIS — C3490 Malignant neoplasm of unspecified part of unspecified bronchus or lung: Secondary | ICD-10-CM

## 2017-05-24 ENCOUNTER — Emergency Department (HOSPITAL_COMMUNITY): Payer: Medicaid Other

## 2017-05-24 ENCOUNTER — Emergency Department (HOSPITAL_COMMUNITY)
Admission: EM | Admit: 2017-05-24 | Discharge: 2017-05-24 | Disposition: A | Discharge status: Home / Self Care | Payer: Medicaid Other | Attending: Emergency Medicine | Admitting: Emergency Medicine

## 2017-05-24 ENCOUNTER — Encounter (HOSPITAL_COMMUNITY): Payer: Self-pay | Admitting: Emergency Medicine

## 2017-05-24 DIAGNOSIS — I10 Essential (primary) hypertension: Secondary | ICD-10-CM | POA: Insufficient documentation

## 2017-05-24 DIAGNOSIS — Z7982 Long term (current) use of aspirin: Secondary | ICD-10-CM | POA: Insufficient documentation

## 2017-05-24 DIAGNOSIS — Z85118 Personal history of other malignant neoplasm of bronchus and lung: Secondary | ICD-10-CM | POA: Insufficient documentation

## 2017-05-24 DIAGNOSIS — Z951 Presence of aortocoronary bypass graft: Secondary | ICD-10-CM | POA: Diagnosis not present

## 2017-05-24 DIAGNOSIS — I5043 Acute on chronic combined systolic (congestive) and diastolic (congestive) heart failure: Secondary | ICD-10-CM | POA: Diagnosis not present

## 2017-05-24 DIAGNOSIS — J449 Chronic obstructive pulmonary disease, unspecified: Secondary | ICD-10-CM | POA: Diagnosis not present

## 2017-05-24 DIAGNOSIS — J0100 Acute maxillary sinusitis, unspecified: Secondary | ICD-10-CM | POA: Insufficient documentation

## 2017-05-24 DIAGNOSIS — R0602 Shortness of breath: Secondary | ICD-10-CM | POA: Insufficient documentation

## 2017-05-24 DIAGNOSIS — Z952 Presence of prosthetic heart valve: Secondary | ICD-10-CM | POA: Diagnosis not present

## 2017-05-24 DIAGNOSIS — Z87891 Personal history of nicotine dependence: Secondary | ICD-10-CM | POA: Diagnosis not present

## 2017-05-24 DIAGNOSIS — R05 Cough: Secondary | ICD-10-CM | POA: Diagnosis present

## 2017-05-24 DIAGNOSIS — Z79899 Other long term (current) drug therapy: Secondary | ICD-10-CM | POA: Diagnosis not present

## 2017-05-24 LAB — CBC WITH DIFFERENTIAL/PLATELET
BASOS ABS: 0.1 10*3/uL (ref 0.0–0.1)
BASOS PCT: 1 %
EOS PCT: 2 %
Eosinophils Absolute: 0.3 10*3/uL (ref 0.0–0.7)
HCT: 35.8 % — ABNORMAL LOW (ref 36.0–46.0)
Hemoglobin: 11.5 g/dL — ABNORMAL LOW (ref 12.0–15.0)
Lymphocytes Relative: 26 %
Lymphs Abs: 3 10*3/uL (ref 0.7–4.0)
MCH: 24.9 pg — ABNORMAL LOW (ref 26.0–34.0)
MCHC: 32.1 g/dL (ref 30.0–36.0)
MCV: 77.5 fL — ABNORMAL LOW (ref 78.0–100.0)
MONO ABS: 0.7 10*3/uL (ref 0.1–1.0)
Monocytes Relative: 6 %
NEUTROS ABS: 7.3 10*3/uL (ref 1.7–7.7)
Neutrophils Relative %: 65 %
PLATELETS: 224 10*3/uL (ref 150–400)
RBC: 4.62 MIL/uL (ref 3.87–5.11)
RDW: 16.2 % — AB (ref 11.5–15.5)
WBC: 11.3 10*3/uL — ABNORMAL HIGH (ref 4.0–10.5)

## 2017-05-24 LAB — COMPREHENSIVE METABOLIC PANEL
ALBUMIN: 3.9 g/dL (ref 3.5–5.0)
ALT: 21 U/L (ref 14–54)
AST: 27 U/L (ref 15–41)
Alkaline Phosphatase: 130 U/L — ABNORMAL HIGH (ref 38–126)
Anion gap: 12 (ref 5–15)
BUN: 8 mg/dL (ref 6–20)
CHLORIDE: 106 mmol/L (ref 101–111)
CO2: 24 mmol/L (ref 22–32)
Calcium: 9.2 mg/dL (ref 8.9–10.3)
Creatinine, Ser: 0.97 mg/dL (ref 0.44–1.00)
GFR calc Af Amer: >60 mL/min (ref >60)
GFR calc non Af Amer: >60 mL/min (ref >60)
Glucose, Bld: 99 mg/dL (ref 65–99)
POTASSIUM: 3.1 mmol/L — AB (ref 3.5–5.1)
SODIUM: 142 mmol/L (ref 135–145)
Total Bilirubin: 1.1 mg/dL (ref 0.3–1.2)
Total Protein: 7.2 g/dL (ref 6.5–8.1)

## 2017-05-24 LAB — TROPONIN I: Troponin I: 0.05 ng/mL (ref <0.03)

## 2017-05-24 LAB — BRAIN NATRIURETIC PEPTIDE: B Natriuretic Peptide: 1226.2 pg/mL — ABNORMAL HIGH (ref 0.0–100.0)

## 2017-05-24 MED ORDER — METOPROLOL TARTRATE 25 MG PO TABS
25.0000 mg | ORAL_TABLET | Freq: Once | ORAL | Status: AC
  Administered 2017-05-24: 25 mg via ORAL
  Filled 2017-05-24: qty 1

## 2017-05-24 MED ORDER — FUROSEMIDE 10 MG/ML IJ SOLN
40.0000 mg | Freq: Once | INTRAMUSCULAR | Status: AC
  Administered 2017-05-24: 40 mg via INTRAVENOUS
  Filled 2017-05-24: qty 4

## 2017-05-24 MED ORDER — ISOSORB DINITRATE-HYDRALAZINE 20-37.5 MG PO TABS
1.0000 | ORAL_TABLET | Freq: Once | ORAL | Status: AC
  Administered 2017-05-24: 1 via ORAL
  Filled 2017-05-24: qty 1

## 2017-05-24 MED ORDER — POTASSIUM CHLORIDE CRYS ER 20 MEQ PO TBCR
40.0000 meq | EXTENDED_RELEASE_TABLET | Freq: Once | ORAL | Status: AC
  Administered 2017-05-24: 40 meq via ORAL
  Filled 2017-05-24: qty 2

## 2017-05-24 MED ORDER — FUROSEMIDE 40 MG PO TABS: 40.0000 mg | ORAL_TABLET | Freq: Every day | ORAL | 0 refills | Status: DC

## 2017-05-24 MED ORDER — FUROSEMIDE 20 MG PO TABS: 20.0000 mg | ORAL_TABLET | Freq: Every day | ORAL | 0 refills | Status: DC

## 2017-05-24 MED ORDER — DOXYCYCLINE HYCLATE 100 MG PO CAPS: 100.0000 mg | ORAL_CAPSULE | Freq: Two times a day (BID) | ORAL | 0 refills | Status: DC

## 2017-05-24 MED ORDER — POTASSIUM CHLORIDE CRYS ER 20 MEQ PO TBCR: 40.0000 meq | EXTENDED_RELEASE_TABLET | Freq: Every day | ORAL | 0 refills | Status: DC

## 2017-05-24 MED ORDER — POTASSIUM CHLORIDE CRYS ER 20 MEQ PO TBCR: 20.0000 meq | EXTENDED_RELEASE_TABLET | Freq: Two times a day (BID) | ORAL | 0 refills | Status: DC

## 2017-05-24 MED ORDER — ALBUTEROL SULFATE (2.5 MG/3ML) 0.083% IN NEBU: 5.0000 mg | INHALATION_SOLUTION | Freq: Once | RESPIRATORY_TRACT | Status: DC

## 2017-05-24 MED ORDER — BENZONATATE 100 MG PO CAPS: 100.0000 mg | ORAL_CAPSULE | Freq: Three times a day (TID) | ORAL | 0 refills | Status: DC | PRN

## 2017-05-24 NOTE — ED Triage Notes (Signed)
Patient c/o non productive cough with SOB x10 days. Reports being around other with similar sx. Speaking in full sentences without difficulty. Denies N/V/D.

## 2017-05-24 NOTE — ED Notes (Addendum)
Date and time results received: 05/24/17 6:51 PM   Test: troponin Critical Value: 0.05  Name of Provider Notified: RN Manuela Schwartz made aware  Orders Received? Or Actions Taken?: MD Issacs informed, waiting for orders

## 2017-05-24 NOTE — ED Provider Notes (Signed)
[facility] DEPT Provider Note   CSN: [id number] Arrival date & time: 05/24/17  1315     History   Chief Complaint Chief Complaint  Patient presents with  . Cough  . Shortness of Breath    HPI Leah [name] is a 67 y.o. female.  HPI 67 year old female with past medical history as below including coronary artery disease, mitral regurgitation, congestive heart failure, here with cough.  The patient states that approximately 10 days ago, she developed a mild dry cough with associated runny nose, congestion, and sore throat.  She has multiple sick contacts.  Her symptoms improved over 2-3 days but she is had persistent cough since then.  The cough is dry.  She states that it significantly worse with lying flat.  She is had some mild swelling in her bilateral legs.  No known weight gain or weight loss.  She has had no further fevers.  No sputum production.  No chest pain.  Mild shortness of breath on lying flat and with significant exertion, but no shortness of breath at rest.  She is been taking her medications as prescribed.  No abdominal pain, nausea or vomiting.  Past Medical History:  Diagnosis Date  . Anemia   . Bronchogenic lung cancer, right ([place]) 11/29/2016  . CAD (coronary artery disease)    a. s/p LIMA-LAD in 11/2014  . Carotid artery occlusion   . Complication of anesthesia    slow to awaken x 1 maybe 2001  . COPD (chronic obstructive pulmonary disease) ([place])   . GERD (gastroesophageal reflux disease)    "sometimes" takes Copywriter, advertising or drinks gingerale   . Headache(784.0)   . History of kidney stones   . Hyperlipidemia   . Hypertension   . Leg pain   . Peripheral vascular disease ([place])   . Renal vascular disease 10/26/2014   bilateral stents placed   . Severe mitral regurgitation    a. s/p MVR in 11/2014 with a pericardial tissue valve  . Shortness of breath dyspnea   . Tobacco abuse     Patient Active Problem List   Diagnosis  Date Noted  . Bronchogenic lung cancer, right ([place]) 11/29/2016  . Dysphagia 08/26/2016  . Health care maintenance 04/17/2016  . Dizziness 04/17/2016  . Decreased visual acuity 01/17/2016  . Depressed mood 07/27/2015  . S/P MVR (mitral valve replacement) 12/05/2014  . Moderate aortic regurgitation 10/26/2014  . Renal vascular disease 10/26/2014  . Acute on chronic diastolic congestive heart failure ([place]) 10/21/2014  . Tobacco use 10/21/2014  . Diastolic dysfunction, grade 2 by echo June 2016 10/21/2014  . Carpal tunnel syndrome 10/17/2014  . Severe mitral regurgitation 08/25/2014  . Unstable angina ([place]) 08/22/2014  . Low back pain 07/05/2012  . PVC (premature ventricular contraction) 10/02/2011  . History of angioedema with ACE 2013 06/14/2011  . PVD- s/p multiple proceedures 06/04/2011  . Hyperlipidemia 09/28/2008  . Essential hypertension, benign 09/28/2008  . CAD S/P LAD DES 2009 with 70% ISR 08/24/14 09/28/2008    Past Surgical History:  Procedure Laterality Date  . ABDOMINAL HYSTERECTOMY    . ANGIOPLASTY / STENTING ILIAC  2010   right external iliac by Dr. Irish Lack  . CARDIAC CATHETERIZATION  11/13/11   Left Heart Cath. with Coronary Angiogram  . CARDIAC CATHETERIZATION N/A 08/24/2014   Procedure: Left Heart Cath and Coronary Angiography;  Surgeon: Wellington Hampshire, MD;  Location: [facility];  Service: Cardiovascular;  Laterality: N/A;  . CARDIAC  CATHETERIZATION N/A 10/26/2014   Procedure: Right/Left Heart Cath and Coronary Angiography;  Surgeon: Peter M Martinique, MD; oLAD 70%, mLAD 70% ISR, D2 30%, OFC 30%, RCA 20%, EF nl, low R heart pressures after diuresis, severe MR  . CAROTID ENDARTERECTOMY  11/21/2007   left  . COLONOSCOPY W/ POLYPECTOMY    . CORONARY ANGIOPLASTY WITH STENT PLACEMENT  6/09   LAD 2.5x12 Promus  . CORONARY ARTERY BYPASS GRAFT N/A 12/05/2014   Procedure: CORONARY ARTERY BYPASS GRAFTING (CABG);  Surgeon: [name], MD;  Location: [facility];   Service: Open Heart Surgery;  Laterality: N/A;  Times 1 using left internal mammary artery to LAD  . FEMORAL-POPLITEAL BYPASS GRAFT  10/12   left Dr. Kellie Simmering  . MITRAL VALVE REPAIR  2016  . MITRAL VALVE REPLACEMENT N/A 12/05/2014   Procedure: MITRAL VALVE (MV) REPLACEMENT;  Surgeon: [name], MD;  Location: [facility];  Service: Open Heart Surgery;  Laterality: N/A;  Closure left atrial appendage  . RENAL ARTERY STENT Bilateral   . TEE WITHOUT CARDIOVERSION N/A 10/24/2014   Procedure: TRANSESOPHAGEAL ECHOCARDIOGRAM (TEE);  Surgeon: Thayer Headings, MD;  Location: [facility];  Service: Cardiovascular;  Laterality: N/A;  . TEE WITHOUT CARDIOVERSION N/A 12/05/2014   Procedure: TRANSESOPHAGEAL ECHOCARDIOGRAM (TEE);  Surgeon: [name], MD;  Location: [facility];  Service: Open Heart Surgery;  Laterality: N/A;  . VIDEO ASSISTED THORACOSCOPY (VATS)/WEDGE RESECTION Right 11/29/2016   Procedure: VIDEO ASSISTED THORACOSCOPY (VATS)/ RUL WEDGE RESECTION OF LESION/ NODE SAMPLING;  Surgeon: [name], MD;  Location: [facility];  Service: Thoracic;  Laterality: Right;  Marland Kitchen VIDEO BRONCHOSCOPY N/A 11/29/2016   Procedure: VIDEO BRONCHOSCOPY;  Surgeon: [name], MD;  Location: [facility] OR;  Service: Thoracic;  Laterality: N/A;    OB History    No data available       Home Medications    Prior to Admission medications   Medication Sig Start Date End Date Taking? Authorizing Provider  albuterol (PROVENTIL HFA;VENTOLIN HFA) 108 (90 Base) MCG/ACT inhaler Inhale 2 puffs into the lungs every 6 (six) hours as needed for wheezing or shortness of breath. 02/20/16   Archie Patten, MD  aspirin EC 81 MG tablet Take 1 tablet (81 mg total) by mouth daily. 07/24/16   Archie Patten, MD  atorvastatin (LIPITOR) 80 MG tablet Take 1 tablet (80 mg total) by mouth daily. 04/14/17   Martinique, Peter M, MD  benzonatate (TESSALON) 100 MG capsule Take 1 capsule (100 mg total) by mouth 3 (three) times daily as needed for  cough. 05/24/17   Duffy Bruce, MD  cyclobenzaprine (FLEXERIL) 10 MG tablet Take 1 TABLET BY MOUTH THREE TIMES DAILY AS NEEDED for muscle spasms 01/06/17   Guadalupe Dawn, MD  cyclobenzaprine (FLEXERIL) 10 MG tablet Take 1 tablet (10 mg total) by mouth 3 (three) times daily as needed. for muscle spams 04/15/17   Guadalupe Dawn, MD  doxycycline (VIBRAMYCIN) 100 MG capsule Take 1 capsule (100 mg total) by mouth 2 (two) times daily. 05/24/17   Duffy Bruce, MD  furosemide (LASIX) 20 MG tablet Take 1 tablet (20 mg total) by mouth daily for 3 days. 05/24/17 05/27/17  Duffy Bruce, MD  isosorbide-hydrALAZINE (BIDIL) 20-37.5 MG tablet Take 1 tablet by mouth 3 (three) times daily. 04/14/17   Martinique, Peter M, MD  metoprolol tartrate (LOPRESSOR) 25 MG tablet Take 1 tablet (25 mg total) by mouth 2 (two) times daily. 04/14/17   Martinique, Peter M, MD  nitroGLYCERIN (  NITROSTAT) 0.4 MG SL tablet Place 1 tablet (0.4 mg total) under the tongue every 5 (five) minutes x 3 doses as needed for chest pain. 10/28/14   Barrett, Evelene Croon, PA-C  potassium chloride SA (K-DUR,KLOR-CON) 20 MEQ tablet Take 1 tablet (20 mEq total) by mouth 2 (two) times daily for 3 days. 05/24/17 05/27/17  Duffy Bruce, MD  traMADol (ULTRAM) 50 MG tablet Take 1 tablet (50 mg total) by mouth every 6 (six) hours as needed for severe pain. for pain 04/10/17   Guadalupe Dawn, MD  traZODone (DESYREL) 100 MG tablet take 1 TABLET BY MOUTH at bedtime AS NEEDED for sleep 05/05/17   Guadalupe Dawn, MD  simvastatin (ZOCOR) 40 MG tablet Take 40 mg by mouth at bedtime.  11/24/10 06/14/11  [provider]    Family History Family History  Problem Relation Age of Onset  . Cancer Mother        BRAIN AND LUNG  . Hypertension Mother   . Hypertension Father   . Heart disease Father   . Prostate cancer Father   . Kidney disease Father   . Hypertension Brother   . ALS Brother   . Stroke Paternal Aunt        great aunt  . Heart attack Neg Hx   . Colon  cancer Neg Hx   . Stomach cancer Neg Hx   . Rectal cancer Neg Hx   . Esophageal cancer Neg Hx   . Liver cancer Neg Hx     Social History Social History   Tobacco Use  . Smoking status: Former Smoker    Packs/day: 0.50    Years: 35.00    Pack years: 17.50    Types: Cigarettes    Last attempt to quit: 11/25/2016    Years since quitting: 0.4  . Smokeless tobacco: Never Used  . Tobacco comment: has not started wellbutrin yet  Substance Use Topics  . Alcohol use: No    Alcohol/week: 0.0 oz  . Drug use: No     Allergies   Lisinopril; Aspirin; Chantix [varenicline]; and Penicillins   Review of Systems Review of Systems  Constitutional: Positive for fatigue. Negative for chills and fever.  HENT: Negative for congestion and rhinorrhea.   Eyes: Negative for visual disturbance.  Respiratory: Positive for cough and shortness of breath (Lying flat). Negative for wheezing.   Cardiovascular: Negative for chest pain and leg swelling.  Gastrointestinal: Negative for abdominal pain, diarrhea, nausea and vomiting.  Genitourinary: Negative for dysuria and flank pain.  Musculoskeletal: Negative for neck pain and neck stiffness.  Skin: Negative for rash and wound.  Allergic/Immunologic: Negative for immunocompromised state.  Neurological: Negative for syncope, weakness and headaches.  All other systems reviewed and are negative.    Physical Exam Updated Vital Signs BP (!) 133/91 (BP Location: Left Arm)   Pulse 86   Temp 98.4 F (36.9 C) (Oral)   Resp (!) 26   SpO2 95%   Physical Exam  Constitutional: She is oriented to person, place, and time. She appears well-developed and well-nourished. No distress.  HENT:  Head: Normocephalic and atraumatic.  Mouth/Throat: Oropharynx is clear and moist.  Eyes: Conjunctivae are normal.  Neck: Neck supple.  Cardiovascular: Normal rate, regular rhythm and normal heart sounds. Exam reveals no friction rub.  No murmur heard. Pulmonary/Chest:  Effort normal and breath sounds normal. No respiratory distress. She has no wheezes. She has no rales.  Abdominal: She exhibits no distension.  Musculoskeletal:  Right lower leg: She exhibits edema (1+ pitting).       Left lower leg: She exhibits edema (1+ pitting).  Neurological: She is alert and oriented to person, place, and time. She exhibits normal muscle tone.  Skin: Skin is warm. Capillary refill takes less than 2 seconds.  Psychiatric: She has a normal mood and affect.  Nursing note and vitals reviewed.    ED Treatments / Results  Labs (all labs ordered are listed, but only abnormal results are displayed) Labs Reviewed  CBC WITH DIFFERENTIAL/PLATELET - Abnormal; Notable for the following components:      Result Value   WBC 11.3 (*)    Hemoglobin 11.5 (*)    HCT 35.8 (*)    MCV 77.5 (*)    MCH 24.9 (*)    RDW 16.2 (*)    All other components within normal limits  COMPREHENSIVE METABOLIC PANEL - Abnormal; Notable for the following components:   Potassium 3.1 (*)    Alkaline Phosphatase 130 (*)    All other components within normal limits  BRAIN NATRIURETIC PEPTIDE - Abnormal; Notable for the following components:   B Natriuretic Peptide 1,226.2 (*)    All other components within normal limits  TROPONIN I - Abnormal; Notable for the following components:   Troponin I 0.05 (*)    All other components within normal limits    EKG  EKG Interpretation  Date/Time:  Saturday May 24 2017 21:14:54 EST Ventricular Rate:  86 PR Interval:    QRS Duration: 88 QT Interval:  415 QTC Calculation: 497 R Axis:   98 Text Interpretation:  Sinus rhythm Right atrial enlargement Right axis deviation Consider left ventricular hypertrophy Anterior Q waves, possibly due to LVH Since last EKG, PVCs have resolved Confirmed by Duffy Bruce [phone]) on 05/25/2017 1:26:28 AM       Radiology Dg Chest 2 View  Result Date: 05/24/2017 CLINICAL DATA:  Cough, shortness of breath EXAM:  CHEST  2 VIEW COMPARISON:  03/20/2017 FINDINGS: Right upper lobe wedge resection. No focal consolidation. No pleural effusion or pneumothorax. Cardiomegaly.  Prosthetic valve. Median sternotomy. IMPRESSION: No evidence of acute cardiopulmonary disease. Electronically Signed   By: Julian Hy M.D.   On: 05/24/2017 14:50    Procedures Procedures (including critical care time)  Medications Ordered in ED Medications  furosemide (LASIX) injection 40 mg (40 mg Intravenous Given 05/24/17 1939)  potassium chloride SA (K-DUR,KLOR-CON) CR tablet 40 mEq (40 mEq Oral Given 05/24/17 1939)  metoprolol tartrate (LOPRESSOR) tablet 25 mg (25 mg Oral Given 05/24/17 2025)  isosorbide-hydrALAZINE (BIDIL) 20-37.5 MG per tablet 1 tablet (1 tablet Oral Given 05/24/17 2025)     Initial Impression / Assessment and Plan / ED Course  I have reviewed the triage vital signs and the nursing notes.  Pertinent labs & imaging results that were available during my care of the patient were reviewed by me and considered in my medical decision making (see chart for details).     67 year old female with past medical history as above including CAD, CHF (combined), h/o MVR s/p MV replacement, here with cough and shortness of breath.  Suspect her cough is multifactorial. She does have sinus congestion, known sick contacts with sputum production so will cover for bronchitis, though no PNA on CXR and no signs of sepsis/systemic illness. No wheezing or signs of bronchospasm. No pleurisy, hypoxia, or signs of PE.   Regarding her SOB, pt does have some mild pitting edema to b/l LE and clinically, has  orthpnea suggestive of CHF. While CXR is clear and lungs show no rales, her BNP is significantly elevated and I suspect there's a component of hypervolemia. She is not on a diuretic at this time. Pt was given dose of IV lasix here with excellent diuresis and significant improvement in her sx. Otherwise, she has a non-ischemic EKG and strongly  denies any chest pain, doubt ACS. Trop minimally elevated but pt has h/o chronic elevation in trop dating as far back as several years ago, with no signs of ongoing ischemia today. Doubt ACS.   I discussed pt's labs, presentation with her in detail. She is feeling improved and would like to return home. Will treat her for bronchitis but also give a very short course of low-dose diuretic, along with potassium supplements, given her sx. I've advised her to call her PCP and Cardiologist on Monday to set up early follow-up and discuss her visit. Return precautions given in detail. Pt ambulatory throughout ED without hypoxia, remain well appearing.   .  Patient given Lasix with significant improvement in her symptoms.  Lab work is otherwise reassuring.  EKG initially showed bigeminy which is not necessarily new for patient, but repeat shows resolution of this and she is been ambulatory in the ED without any hypoxia or arrhythmia.  Final Clinical Impressions(s) / ED Diagnoses   Final diagnoses:  Acute non-recurrent maxillary sinusitis  Acute on chronic combined systolic and diastolic congestive heart failure (HCC)     Duffy Bruce, MD 05/25/17 0131

## 2017-05-24 NOTE — ED Notes (Signed)
Ambulated pt to bathroom and back to room.  Lowest 02 was 93% with normal gait.

## 2017-05-24 NOTE — Discharge Instructions (Addendum)
Call your doctor to set up an appointment early this week.  You should have repeat lab work and checkup.

## 2017-05-27 ENCOUNTER — Ambulatory Visit: Payer: Medicaid Other | Admitting: Family Medicine

## 2017-06-05 ENCOUNTER — Encounter (HOSPITAL_COMMUNITY): Payer: Self-pay | Admitting: Emergency Medicine

## 2017-06-05 ENCOUNTER — Emergency Department (HOSPITAL_COMMUNITY): Payer: Medicaid Other

## 2017-06-05 ENCOUNTER — Inpatient Hospital Stay (HOSPITAL_COMMUNITY)
Admission: EM | Admit: 2017-06-05 | Discharge: 2017-06-07 | DRG: 292 | Disposition: A | Discharge status: Home / Self Care | Payer: Medicaid Other | Attending: Cardiovascular Disease | Admitting: Cardiovascular Disease

## 2017-06-05 ENCOUNTER — Other Ambulatory Visit: Payer: Self-pay

## 2017-06-05 DIAGNOSIS — I110 Hypertensive heart disease with heart failure: Principal | ICD-10-CM | POA: Diagnosis present

## 2017-06-05 DIAGNOSIS — Z951 Presence of aortocoronary bypass graft: Secondary | ICD-10-CM

## 2017-06-05 DIAGNOSIS — I10 Essential (primary) hypertension: Secondary | ICD-10-CM | POA: Diagnosis present

## 2017-06-05 DIAGNOSIS — Z23 Encounter for immunization: Secondary | ICD-10-CM

## 2017-06-05 DIAGNOSIS — E785 Hyperlipidemia, unspecified: Secondary | ICD-10-CM | POA: Diagnosis present

## 2017-06-05 DIAGNOSIS — Z9114 Patient's other noncompliance with medication regimen: Secondary | ICD-10-CM

## 2017-06-05 DIAGNOSIS — F17210 Nicotine dependence, cigarettes, uncomplicated: Secondary | ICD-10-CM | POA: Diagnosis present

## 2017-06-05 DIAGNOSIS — I509 Heart failure, unspecified: Secondary | ICD-10-CM

## 2017-06-05 DIAGNOSIS — J449 Chronic obstructive pulmonary disease, unspecified: Secondary | ICD-10-CM | POA: Diagnosis present

## 2017-06-05 DIAGNOSIS — I5043 Acute on chronic combined systolic (congestive) and diastolic (congestive) heart failure: Secondary | ICD-10-CM | POA: Diagnosis not present

## 2017-06-05 DIAGNOSIS — R0602 Shortness of breath: Secondary | ICD-10-CM

## 2017-06-05 DIAGNOSIS — I5023 Acute on chronic systolic (congestive) heart failure: Secondary | ICD-10-CM

## 2017-06-05 DIAGNOSIS — Z953 Presence of xenogenic heart valve: Secondary | ICD-10-CM | POA: Diagnosis not present

## 2017-06-05 DIAGNOSIS — I5082 Biventricular heart failure: Secondary | ICD-10-CM

## 2017-06-05 DIAGNOSIS — Z9861 Coronary angioplasty status: Secondary | ICD-10-CM

## 2017-06-05 DIAGNOSIS — I340 Nonrheumatic mitral (valve) insufficiency: Secondary | ICD-10-CM | POA: Diagnosis present

## 2017-06-05 DIAGNOSIS — I2510 Atherosclerotic heart disease of native coronary artery without angina pectoris: Secondary | ICD-10-CM

## 2017-06-05 DIAGNOSIS — Z955 Presence of coronary angioplasty implant and graft: Secondary | ICD-10-CM

## 2017-06-05 DIAGNOSIS — I5033 Acute on chronic diastolic (congestive) heart failure: Secondary | ICD-10-CM | POA: Diagnosis present

## 2017-06-05 DIAGNOSIS — K219 Gastro-esophageal reflux disease without esophagitis: Secondary | ICD-10-CM | POA: Diagnosis present

## 2017-06-05 DIAGNOSIS — Z952 Presence of prosthetic heart valve: Secondary | ICD-10-CM

## 2017-06-05 DIAGNOSIS — Z79899 Other long term (current) drug therapy: Secondary | ICD-10-CM

## 2017-06-05 DIAGNOSIS — J9811 Atelectasis: Secondary | ICD-10-CM | POA: Diagnosis present

## 2017-06-05 DIAGNOSIS — Z7982 Long term (current) use of aspirin: Secondary | ICD-10-CM

## 2017-06-05 DIAGNOSIS — I739 Peripheral vascular disease, unspecified: Secondary | ICD-10-CM | POA: Diagnosis not present

## 2017-06-05 DIAGNOSIS — Z85118 Personal history of other malignant neoplasm of bronchus and lung: Secondary | ICD-10-CM

## 2017-06-05 LAB — CBC
HCT: 35.4 % — ABNORMAL LOW (ref 36.0–46.0)
HEMATOCRIT: 40.1 % (ref 36.0–46.0)
HEMOGLOBIN: 11.1 g/dL — AB (ref 12.0–15.0)
HEMOGLOBIN: 12.9 g/dL (ref 12.0–15.0)
MCH: 24.4 pg — ABNORMAL LOW (ref 26.0–34.0)
MCH: 25.1 pg — ABNORMAL LOW (ref 26.0–34.0)
MCHC: 31.4 g/dL (ref 30.0–36.0)
MCHC: 32.2 g/dL (ref 30.0–36.0)
MCV: 78 fL (ref 78.0–100.0)
MCV: 78 fL (ref 78.0–100.0)
Platelets: 190 10*3/uL (ref 150–400)
Platelets: 210 10*3/uL (ref 150–400)
RBC: 4.54 MIL/uL (ref 3.87–5.11)
RBC: 5.14 MIL/uL — AB (ref 3.87–5.11)
RDW: 16.9 % — ABNORMAL HIGH (ref 11.5–15.5)
RDW: 17.3 % — ABNORMAL HIGH (ref 11.5–15.5)
WBC: 10.6 10*3/uL — AB (ref 4.0–10.5)
WBC: 11.8 10*3/uL — AB (ref 4.0–10.5)

## 2017-06-05 LAB — BASIC METABOLIC PANEL
ANION GAP: 13 (ref 5–15)
ANION GAP: 15 (ref 5–15)
BUN: 9 mg/dL (ref 6–20)
BUN: 11 mg/dL (ref 6–20)
CALCIUM: 9.2 mg/dL (ref 8.9–10.3)
CO2: 20 mmol/L — ABNORMAL LOW (ref 22–32)
CO2: 25 mmol/L (ref 22–32)
CREATININE: 1.14 mg/dL — AB (ref 0.44–1.00)
Calcium: 9.9 mg/dL (ref 8.9–10.3)
Chloride: 105 mmol/L (ref 101–111)
Chloride: 101 mmol/L (ref 101–111)
Creatinine, Ser: 1.07 mg/dL — ABNORMAL HIGH (ref 0.44–1.00)
GFR calc non Af Amer: 50 mL/min — ABNORMAL LOW (ref >60)
GFR, EST AFRICAN AMERICAN: 58 mL/min — AB (ref >60)
GFR, EST NON AFRICAN AMERICAN: 54 mL/min — AB (ref >60)
Glucose, Bld: 114 mg/dL — ABNORMAL HIGH (ref 65–99)
Glucose, Bld: 127 mg/dL — ABNORMAL HIGH (ref 65–99)
POTASSIUM: 3.1 mmol/L — AB (ref 3.5–5.1)
POTASSIUM: 2.9 mmol/L — AB (ref 3.5–5.1)
SODIUM: 141 mmol/L (ref 135–145)
Sodium: 138 mmol/L (ref 135–145)

## 2017-06-05 LAB — I-STAT TROPONIN, ED: TROPONIN I, POC: 0.05 ng/mL (ref 0.00–0.08)

## 2017-06-05 LAB — BRAIN NATRIURETIC PEPTIDE: B NATRIURETIC PEPTIDE 5: 2704.4 pg/mL — AB (ref 0.0–100.0)

## 2017-06-05 MED ORDER — SODIUM CHLORIDE 0.9 % IV SOLN: 250.0000 mL | INTRAVENOUS | Status: DC | PRN

## 2017-06-05 MED ORDER — PNEUMOCOCCAL VAC POLYVALENT 25 MCG/0.5ML IJ INJ
0.5000 mL | INJECTION | INTRAMUSCULAR | Status: AC
  Administered 2017-06-06: 0.5 mL via INTRAMUSCULAR
  Filled 2017-06-05: qty 0.5

## 2017-06-05 MED ORDER — FUROSEMIDE 10 MG/ML IJ SOLN
80.0000 mg | Freq: Two times a day (BID) | INTRAMUSCULAR | Status: DC
  Administered 2017-06-06 – 2017-06-07 (×3): 80 mg via INTRAVENOUS
  Filled 2017-06-05 (×3): qty 8

## 2017-06-05 MED ORDER — ACETAMINOPHEN 325 MG PO TABS
325.0000 mg | ORAL_TABLET | Freq: Four times a day (QID) | ORAL | Status: DC | PRN
  Administered 2017-06-05: 650 mg via ORAL
  Filled 2017-06-05: qty 2

## 2017-06-05 MED ORDER — SODIUM CHLORIDE 0.9% FLUSH
3.0000 mL | Freq: Two times a day (BID) | INTRAVENOUS | Status: DC
  Administered 2017-06-05 – 2017-06-07 (×4): 3 mL via INTRAVENOUS

## 2017-06-05 MED ORDER — ISOSORB DINITRATE-HYDRALAZINE 20-37.5 MG PO TABS
1.0000 | ORAL_TABLET | Freq: Once | ORAL | Status: AC
  Administered 2017-06-05: 1 via ORAL
  Filled 2017-06-05: qty 1

## 2017-06-05 MED ORDER — ONDANSETRON HCL 4 MG/2ML IJ SOLN: 4.0000 mg | Freq: Four times a day (QID) | INTRAMUSCULAR | Status: DC | PRN

## 2017-06-05 MED ORDER — ISOSORB DINITRATE-HYDRALAZINE 20-37.5 MG PO TABS
1.0000 | ORAL_TABLET | Freq: Three times a day (TID) | ORAL | Status: DC
  Administered 2017-06-05 – 2017-06-07 (×5): 1 via ORAL
  Filled 2017-06-05 (×5): qty 1

## 2017-06-05 MED ORDER — FUROSEMIDE 10 MG/ML IJ SOLN
80.0000 mg | Freq: Once | INTRAMUSCULAR | Status: AC
  Administered 2017-06-05: 80 mg via INTRAVENOUS
  Filled 2017-06-05: qty 8

## 2017-06-05 MED ORDER — ATORVASTATIN CALCIUM 80 MG PO TABS
80.0000 mg | ORAL_TABLET | Freq: Every day | ORAL | Status: DC
  Administered 2017-06-06: 80 mg via ORAL
  Filled 2017-06-05: qty 1

## 2017-06-05 MED ORDER — HEPARIN SODIUM (PORCINE) 5000 UNIT/ML IJ SOLN
5000.0000 [IU] | Freq: Three times a day (TID) | INTRAMUSCULAR | Status: DC
  Administered 2017-06-05 – 2017-06-07 (×5): 5000 [IU] via SUBCUTANEOUS
  Filled 2017-06-05 (×5): qty 1

## 2017-06-05 MED ORDER — INFLUENZA VAC SPLIT QUAD 0.5 ML IM SUSY
0.5000 mL | PREFILLED_SYRINGE | INTRAMUSCULAR | Status: AC
  Administered 2017-06-06: 0.5 mL via INTRAMUSCULAR
  Filled 2017-06-05: qty 0.5

## 2017-06-05 MED ORDER — METOPROLOL TARTRATE 25 MG PO TABS
25.0000 mg | ORAL_TABLET | Freq: Two times a day (BID) | ORAL | Status: DC
  Administered 2017-06-05 – 2017-06-07 (×4): 25 mg via ORAL
  Filled 2017-06-05 (×4): qty 1

## 2017-06-05 MED ORDER — METOPROLOL TARTRATE 25 MG PO TABS
25.0000 mg | ORAL_TABLET | Freq: Once | ORAL | Status: AC
  Administered 2017-06-05: 25 mg via ORAL
  Filled 2017-06-05: qty 1

## 2017-06-05 MED ORDER — ASPIRIN EC 81 MG PO TBEC
81.0000 mg | DELAYED_RELEASE_TABLET | Freq: Every day | ORAL | Status: DC
  Administered 2017-06-05 – 2017-06-07 (×3): 81 mg via ORAL
  Filled 2017-06-05 (×3): qty 1

## 2017-06-05 MED ORDER — POTASSIUM CHLORIDE CRYS ER 20 MEQ PO TBCR
20.0000 meq | EXTENDED_RELEASE_TABLET | Freq: Two times a day (BID) | ORAL | Status: DC
  Administered 2017-06-05 – 2017-06-07 (×4): 20 meq via ORAL
  Filled 2017-06-05 (×4): qty 1

## 2017-06-05 MED ORDER — IOPAMIDOL (ISOVUE-370) INJECTION 76%
INTRAVENOUS | Status: AC
  Administered 2017-06-05: 100 mL
  Filled 2017-06-05: qty 100

## 2017-06-05 MED ORDER — SODIUM CHLORIDE 0.9% FLUSH: 3.0000 mL | INTRAVENOUS | Status: DC | PRN

## 2017-06-05 NOTE — ED Triage Notes (Signed)
Patient complains of increasing shortness of breath over the last few weeks. States she was treated for "fluid on her lungs" two Saturdays ago but does not feel that she has had improvement since then, states that she now has trouble laying flat because she feels even more short of breath. Patient also complains of left lower abdominal pain Patient in no apparent distress at this time.

## 2017-06-05 NOTE — ED Notes (Signed)
No addl blood draw,  Pt moved to inpatient.

## 2017-06-05 NOTE — ED Notes (Signed)
Pt ambulated to RR with steady gait

## 2017-06-05 NOTE — ED Notes (Signed)
ED Provider at bedside. 

## 2017-06-05 NOTE — H&P (Addendum)
Cardiology Consultation:   Patient ID: [name]; [id number]; [birth year]   Admit date: 06/05/2017 Date of Consult: 06/05/2017  Primary Care Provider: Guadalupe Dawn, MD Primary Cardiologist: Dr. Peter Martinique Chief Complaint: Shortness of breath  Patient Profile:   Leah [name] is a 67 y.o. female with a hx of CAD (s/p LIMA-LAD in 11/2014), MVR (in 11/2014 with pericardial tissue valve), HTN, HLD, COPD, PVD (s/p L CEA and right external iliac stenting), and CHF who is being seen today for the evaluation of CHF exacerbation at the request of Dr. Rogene Houston.   History of Present Illness:   Leah [name] is a 67 year old female with a history as stated above presented to [facility] on 06/05/2017 with complaints of shortness of breath over the last several months. She was last seen at [facility] on 05/24/2017 for similar complaints. At that time, she was treated with IV Lasix 40mg  and several days of PO dosing with good response however, states that her breathing is still getting worse and has felt no improvement. She has been having trouble with orthopnea and PND, having to sleep upright at night.  She denies chest pain, palpitations, dizziness or syncope.  In the emergency department, a chest x-ray was completed which shows chronic cardiomegaly and small right pleural effusion, negative for edema or consolidation and postoperative right upper lobe. Given her ongoing symptoms, a chest CT Angio was performed which shows subtle groundglass opacity in the right lower lobe which could reflect pneumonia, but is more likely due to atelectasis or air trapping and mild interstitial thickening, and right small pleural effusion. Her BNP is doubled from her last admission in early March at 2,704. An iTrop level was drawn and is 0.05 and she was hypertensive on arrival. She was given her home blood pressure medications, which is now resolved.  Additionally, she was  given IV Lasix 80 mg and has been urinating, although this has not been measured. Her current BP is 112/71 and she is stable.  Of note, in 10/2016 she had a preop evaluation with a Myoview study that showed no ischemic and reduced EF. She had a right upper lobe lung nodule which had increased in size. She underwent a wedge resection in September 2018 with pathology showing adenocarcinoma stage I with negative margins.  An echocardiogram was performed 12/2016 which showed a decrease in LVEF at 40% with normally functioning mitral valve prosthesis.  She was last seen by Dr. Martinique 04/14/2017. Cardiology will admit and plan is for diuresis.   Past Medical History:  Diagnosis Date  . Anemia   . Bronchogenic lung cancer, right ([place]) 11/29/2016  . CAD (coronary artery disease)    a. s/p LIMA-LAD in 11/2014  . Carotid artery occlusion   . Complication of anesthesia    slow to awaken x 1 maybe 2001  . COPD (chronic obstructive pulmonary disease) ([place])   . GERD (gastroesophageal reflux disease)    "sometimes" takes Copywriter, advertising or drinks gingerale   . Headache(784.0)   . History of kidney stones   . Hyperlipidemia   . Hypertension   . Leg pain   . Peripheral vascular disease ([place])   . Renal vascular disease 10/26/2014   bilateral stents placed   . Severe mitral regurgitation    a. s/p MVR in 11/2014 with a pericardial tissue valve  . Shortness of breath dyspnea   . Tobacco abuse     Past Surgical History:  Procedure Laterality Date  .  ABDOMINAL HYSTERECTOMY    . ANGIOPLASTY / STENTING ILIAC  2010   right external iliac by Dr. Irish Lack  . CARDIAC CATHETERIZATION  11/13/11   Left Heart Cath. with Coronary Angiogram  . CARDIAC CATHETERIZATION N/A 08/24/2014   Procedure: Left Heart Cath and Coronary Angiography;  Surgeon: Wellington Hampshire, MD;  Location: [place] [facility];  Service: Cardiovascular;  Laterality: N/A;  . CARDIAC CATHETERIZATION N/A 10/26/2014   Procedure: Right/Left Heart Cath and  Coronary Angiography;  Surgeon: Peter M Martinique, MD; oLAD 70%, mLAD 70% ISR, D2 30%, OFC 30%, RCA 20%, EF nl, low R heart pressures after diuresis, severe MR  . CAROTID ENDARTERECTOMY  11/21/2007   left  . COLONOSCOPY W/ POLYPECTOMY    . CORONARY ANGIOPLASTY WITH STENT PLACEMENT  6/09   LAD 2.5x12 Promus  . CORONARY ARTERY BYPASS GRAFT N/A 12/05/2014   Procedure: CORONARY ARTERY BYPASS GRAFTING (CABG);  Surgeon: [name], MD;  Location: [place];  Service: Open Heart Surgery;  Laterality: N/A;  Times 1 using left internal mammary artery to LAD  . FEMORAL-POPLITEAL BYPASS GRAFT  10/12   left Dr. Kellie Simmering  . MITRAL VALVE REPAIR  2016  . MITRAL VALVE REPLACEMENT N/A 12/05/2014   Procedure: MITRAL VALVE (MV) REPLACEMENT;  Surgeon: [name], MD;  Location: [place];  Service: Open Heart Surgery;  Laterality: N/A;  Closure left atrial appendage  . RENAL ARTERY STENT Bilateral   . TEE WITHOUT CARDIOVERSION N/A 10/24/2014   Procedure: TRANSESOPHAGEAL ECHOCARDIOGRAM (TEE);  Surgeon: Thayer Headings, MD;  Location: [place];  Service: Cardiovascular;  Laterality: N/A;  . TEE WITHOUT CARDIOVERSION N/A 12/05/2014   Procedure: TRANSESOPHAGEAL ECHOCARDIOGRAM (TEE);  Surgeon: [name], MD;  Location: [place];  Service: Open Heart Surgery;  Laterality: N/A;  . VIDEO ASSISTED THORACOSCOPY (VATS)/WEDGE RESECTION Right 11/29/2016   Procedure: VIDEO ASSISTED THORACOSCOPY (VATS)/ RUL WEDGE RESECTION OF LESION/ NODE SAMPLING;  Surgeon: [name], MD;  Location: [place];  Service: Thoracic;  Laterality: Right;  Marland Kitchen VIDEO BRONCHOSCOPY N/A 11/29/2016   Procedure: VIDEO BRONCHOSCOPY;  Surgeon: [name], MD;  Location: [facility];  Service: Thoracic;  Laterality: N/A;     Prior to Admission medications   Medication Sig Start Date End Date Taking? Authorizing Provider  acetaminophen (TYLENOL) 325 MG tablet Take 325-650 mg by mouth every 6 (six) hours as needed (for headaches).   Yes [provider]  aspirin EC 81 MG tablet Take 1 tablet (81 mg total) by mouth daily. 07/24/16  Yes Archie Patten, MD  Aspirin-Acetaminophen-Caffeine (GOODY HEADACHE PO) Take 1 packet by mouth daily as needed (for headaches).   Yes [provider]  atorvastatin (LIPITOR) 80 MG tablet Take 1 tablet (80 mg total) by mouth daily. 04/14/17  Yes Martinique, Peter M, MD  isosorbide-hydrALAZINE (BIDIL) 20-37.5 MG tablet Take 1 tablet by mouth 3 (three) times daily. 04/14/17  Yes Martinique, Peter M, MD  metoprolol tartrate (LOPRESSOR) 25 MG tablet Take 1 tablet (25 mg total) by mouth 2 (two) times daily. 04/14/17  Yes Martinique, Peter M, MD  traZODone (DESYREL) 100 MG tablet take 1 TABLET BY MOUTH at bedtime AS NEEDED for sleep Patient taking differently: Take 100 mg by mouth at bedtime as needed for sleep 05/05/17  Yes Guadalupe Dawn, MD  albuterol (PROVENTIL HFA;VENTOLIN HFA) 108 (90 Base) MCG/ACT inhaler Inhale 2 puffs into the lungs every 6 (six) hours as needed for wheezing or shortness of breath. Patient not taking: Reported  on 06/05/2017 02/20/16   Archie Patten, MD  benzonatate (TESSALON) 100 MG capsule Take 1 capsule (100 mg total) by mouth 3 (three) times daily as needed for cough. Patient not taking: Reported on 06/05/2017 05/24/17   Duffy Bruce, MD  cyclobenzaprine (FLEXERIL) 10 MG tablet Take 1 TABLET BY MOUTH THREE TIMES DAILY AS NEEDED for muscle spasms Patient not taking: Reported on 06/05/2017 01/06/17   Guadalupe Dawn, MD  cyclobenzaprine (FLEXERIL) 10 MG tablet Take 1 tablet (10 mg total) by mouth 3 (three) times daily as needed. for muscle spams Patient not taking: Reported on 06/05/2017 04/15/17   Guadalupe Dawn, MD  doxycycline (VIBRAMYCIN) 100 MG capsule Take 1 capsule (100 mg total) by mouth 2 (two) times daily. Patient not taking: Reported on 06/05/2017 05/24/17   Duffy Bruce, MD  furosemide (LASIX) 20 MG tablet Take 1 tablet (20 mg total) by mouth daily for 3 days. Patient not  taking: Reported on 06/05/2017 05/24/17 06/05/17  Duffy Bruce, MD  nitroGLYCERIN (NITROSTAT) 0.4 MG SL tablet Place 1 tablet (0.4 mg total) under the tongue every 5 (five) minutes x 3 doses as needed for chest pain. Patient not taking: Reported on 06/05/2017 10/28/14   Barrett, Evelene Croon, PA-C  potassium chloride SA (K-DUR,KLOR-CON) 20 MEQ tablet Take 1 tablet (20 mEq total) by mouth 2 (two) times daily for 3 days. Patient not taking: Reported on 06/05/2017 05/24/17 06/05/17  Duffy Bruce, MD  traMADol (ULTRAM) 50 MG tablet Take 1 tablet (50 mg total) by mouth every 6 (six) hours as needed for severe pain. for pain Patient not taking: Reported on 06/05/2017 04/10/17   Guadalupe Dawn, MD  simvastatin (ZOCOR) 40 MG tablet Take 40 mg by mouth at bedtime.  11/24/10 06/14/11  [provider]    Inpatient Medications: Scheduled Meds:  Continuous Infusions:  PRN Meds:   Allergies:    Allergies  Allergen Reactions  . Lisinopril Swelling    Angioedema 06/10/11  . Chantix [Varenicline] Other (See Comments)    Caused insomnia  . Penicillins Hives    Has patient had a PCN reaction causing immediate rash, facial/tongue/throat swelling, SOB or lightheadedness with hypotension: Yes Has patient had a PCN reaction causing severe rash involving mucus membranes or skin necrosis: No Has patient had a PCN reaction that required hospitalization: No Has patient had a PCN reaction occurring within the last 10 years: No If all of the above answers are "NO", then may proceed with Cephalosporin use.     Social History:   Social History   Socioeconomic History  . Marital status: Divorced    Spouse name: Not on file  . Number of children: 4  . Years of education: Not on file  . Highest education level: Not on file  Social Needs  . Financial resource strain: Not on file  . Food insecurity - worry: Not on file  . Food insecurity - inability: Not on file  . Transportation needs - medical: Not on file    . Transportation needs - non-medical: Not on file  Occupational History  . Occupation: retired  Tobacco Use  . Smoking status: Former Smoker    Packs/day: 0.50    Years: 35.00    Pack years: 17.50    Types: Cigarettes  . Smokeless tobacco: Never Used  . Tobacco comment: has not started wellbutrin yet  Substance and Sexual Activity  . Alcohol use: No    Alcohol/week: 0.0 oz  . Drug use: No  . Sexual activity: No  Other Topics Concern  . Not on file  Social History Narrative  . Not on file    Family History:   Family History  Problem Relation Age of Onset  . Cancer Mother        BRAIN AND LUNG  . Hypertension Mother   . Hypertension Father   . Heart disease Father   . Prostate cancer Father   . Kidney disease Father   . Hypertension Brother   . ALS Brother   . Stroke Paternal Aunt        great aunt  . Heart attack Neg Hx   . Colon cancer Neg Hx   . Stomach cancer Neg Hx   . Rectal cancer Neg Hx   . Esophageal cancer Neg Hx   . Liver cancer Neg Hx    Family Status:  Family Status  Relation Name Status  . Mother  Deceased       cancer  . Father  Deceased  . Brother  Alive  . Brother  Alive  . Brother  Alive  . Brother  Alive  . Brother  Alive  . Brother  Deceased       ALS  . Ethlyn Daniels  (Not Specified)  . Neg Hx  (Not Specified)    ROS:  Please see the history of present illness.  All other ROS reviewed and negative.     Physical Exam/Data:   Vitals:   06/05/17 1445 06/05/17 1515 06/05/17 1530 06/05/17 1630  BP: (!) 111/92 114/78 112/71 (!) 137/96  Pulse: 88 90 89 86  Resp: 15 19 18 19   Temp:      TempSrc:      SpO2: 95% 97% 97% 99%   No intake or output data in the 24 hours ending 06/05/17 1854 There were no vitals filed for this visit. There is no height or weight on file to calculate BMI.   General: Well developed, well nourished, NAD Skin: Warm, dry, intact  Head: Normocephalic, atraumatic,  clear, moist mucus membranes. Neck: Negative  for carotid bruits. No JVD Lungs: Diminished in the right lung field otherwise clear to ausculation bilaterally. No wheezes, rales, or rhonchi. Breathing is unlabored. Cardiovascular: RRR with S1 S2. No murmurs, rubs, or gallops Abdomen: Soft, non-tender, non-distended with normoactive bowel sounds. No hepatomegaly, No rebound/guarding. No obvious abdominal masses. MSK: Strength and tone appear normal for age. 5/5 in all extremities Extremities: No edema. No clubbing or cyanosis. DP/PT pulses 2+ bilaterally Neuro: Alert and oriented. No focal deficits. No facial asymmetry. MAE spontaneously. Psych: Responds to questions appropriately with normal affect.     EKG:  The EKG was personally reviewed and demonstrates: 06/05/2017 NSR, HR 101 with evidence of LVH and T wave abnormalities consistent with previous tracings Telemetry:  Telemetry was personally reviewed and demonstrates: 06/05/2017 normal sinus rhythm with bigeminy at times, frequent PVCs  Relevant CV Studies:  ECHO: 01/13/17 Study Conclusions  - Left ventricle: The cavity size was normal. Wall thickness was   normal. Systolic function was mildly to moderately reduced. The   estimated ejection fraction was 40%. Diffuse hypokinesis. There   was a reduced contribution of atrial contraction to ventricular   filling, due to increased ventricular diastolic pressure or   atrial contractile dysfunction. Doppler parameters are consistent   with high ventricular filling pressure. - Aortic valve: There was mild regurgitation. - Mitral valve: A bioprosthesis was present. with normal function.   Mean gradient (D): 2 mm Hg. Peak gradient (D):  10 mm Hg. - Left atrium: The atrium was moderately dilated. - Pulmonary arteries: PA peak pressure: 38 mm Hg (S).  CATH: 10/26/14   Mid RCA lesion, 20% stenosed.  Ost Cx lesion, 30% stenosed.  Mid Cx lesion, 30% stenosed.  Mid LAD to Dist LAD lesion, 70% stenosed. A drug-eluting stent was placed.  The lesion was previously treated with a drug-eluting stent greater than two years ago.  2nd Diag lesion, 30% stenosed.  Ost LAD lesion, 70% stenosed.   1. Moderate single vessel obstructive CAD. There is a 70% ostial stenosis noted in the RAO caudal view that was not noted on prior angiogram. There is a 70% stenosis in the distal portion of the prior LAD stent 2. Low normal LV function- EF approximately 50%. 3. Moderate to severe MR 3+ 4. Normal right heart pressures. Initial filling pressures were remarkably low due to aggressive diuresis.   Recommendation: Surgical evaluation to consider MV repair/replacement and single vessel CABG to reduce risk of recurrent CHF and worsening LV function.    Indications    Laboratory Data:  Chemistry Recent Labs  Lab 06/05/17 0913  NA 138  K 3.1*  CL 105  CO2 20*  GLUCOSE 114*  BUN 9  CREATININE 1.07*  CALCIUM 9.2  GFRNONAA 54*  GFRAA >60  ANIONGAP 13    Total Protein  Date Value Ref Range Status  05/24/2017 7.2 6.5 - 8.1 g/dL Final  11/18/2016 7.2 6.0 - 8.5 g/dL Final   Albumin  Date Value Ref Range Status  05/24/2017 3.9 3.5 - 5.0 g/dL Final  11/18/2016 4.5 3.6 - 4.8 g/dL Final   AST  Date Value Ref Range Status  05/24/2017 27 15 - 41 U/L Final   ALT  Date Value Ref Range Status  05/24/2017 21 14 - 54 U/L Final   Alkaline Phosphatase  Date Value Ref Range Status  05/24/2017 130 (H) 38 - 126 U/L Final   Total Bilirubin  Date Value Ref Range Status  05/24/2017 1.1 0.3 - 1.2 mg/dL Final   Bilirubin Total  Date Value Ref Range Status  11/18/2016 0.5 0.0 - 1.2 mg/dL Final   Hematology Recent Labs  Lab 06/05/17 0913  WBC 10.6*  RBC 4.54  HGB 11.1*  HCT 35.4*  MCV 78.0  MCH 24.4*  MCHC 31.4  RDW 16.9*  PLT 190   Cardiac EnzymesNo results for input(s): TROPONINI in the last 168 hours.  Recent Labs  Lab 06/05/17 0929  TROPIPOC 0.05    BNP Recent Labs  Lab 06/05/17 0913  BNP 2,704.4*    DDimer No  results for input(s): DDIMER in the last 168 hours. TSH:  Lab Results  Component Value Date   TSH 1.756 11/28/2014   Lipids: Lab Results  Component Value Date   CHOL 246 (H) 11/18/2016   HDL 51 11/18/2016   LDLCALC 176 (H) 11/18/2016   LDLDIRECT 185 (A) 09/09/2011   TRIG 96 11/18/2016   CHOLHDL 4.8 (H) 11/18/2016   HgbA1c: Lab Results  Component Value Date   HGBA1C 5.6 01/15/2016    Radiology/Studies:  Dg Chest 2 View  Result Date: 06/05/2017 CLINICAL DATA:  Shortness of breath EXAM: CHEST - 2 VIEW COMPARISON:  05/24/2017 FINDINGS: Chronic cardiopericardial enlargement. Status post mitral valve replacement. Mild aortic tortuosity that is stable. Small right pleural effusion. No pulmonary edema or air bronchograms. Postoperative right apex. IMPRESSION: 1. Chronic cardiomegaly and small right pleural effusion. Negative for edema or consolidation. 2. Postoperative right upper lobe. Electronically  Signed   By: Monte Fantasia M.D.   On: 06/05/2017 09:36   Ct Angio Chest Pe W/cm &/or Wo Cm  Result Date: 06/05/2017 CLINICAL DATA:  Right-sided chest pain with shortness of breath beginning last night. Associated dry cough. EXAM: CT ANGIOGRAPHY CHEST WITH CONTRAST TECHNIQUE: Multidetector CT imaging of the chest was performed using the standard protocol during bolus administration of intravenous contrast. Multiplanar CT image reconstructions and MIPs were obtained to evaluate the vascular anatomy. CONTRAST:  163mL ISOVUE-370 IOPAMIDOL (ISOVUE-370) INJECTION 76% COMPARISON:  Current chest radiograph.  Chest CT, 11/12/2016. FINDINGS: Cardiovascular: Satisfactory opacification of the pulmonary arteries to the segmental level. No evidence of pulmonary embolism. Heart is mildly enlarged. There changes from previous cardiac surgery and mitral valve replacement. Three-vessel coronary artery calcifications are noted. No pericardial effusion. There is prominence of the descending thoracic aorta which is  stable from prior exam. Aortic atherosclerotic calcifications are noted. Mediastinum/Nodes: No neck base, axillary, mediastinal or hilar masses or adenopathy. Trachea is normal in caliber and widely patent. Esophagus is unremarkable. Lungs/Pleura: Small right pleural effusion. Pulmonary anastomosis staples are noted in the right upper lobe associated with scarring, new since the prior CT, subsequent to resection of the previously seen small right upper lobe nodule. There is peripheral interstitial thickening most evident in the right lung. There is mild dependent subsegmental atelectasis. There is subtle ground-glass type opacity in the right lower lobe which could be due to atelectasis and/or air trapping. Infection is possible. No other evidence of pneumonia. There are changes of mild centrilobular emphysema. No lung masses or suspicious nodules. Upper Abdomen: No acute abnormality. Musculoskeletal: No fracture or acute finding. No osteoblastic or osteolytic lesions. Review of the MIP images confirms the above findings. IMPRESSION: 1. Subtle ground-glass opacity in the right lower lobe could reflect pneumonia but is more likely due to atelectasis/air trapping. No other evidence of pneumonia. 2. Mild interstitial thickening is noted most evident in the right lung, which may reflect mild asymmetric interstitial pulmonary edema from congestive heart failure. 3. Small right pleural effusion. 4. Patient has had a wedge resection of the right upper lobe removing the previously seen pulmonary nodule. 5. Mild centrilobular emphysema. 6. Cardiomegaly and changes from mitral valve replacement. Heart is larger than it was on the prior CT. Aortic Atherosclerosis (ICD10-I70.0) and Emphysema (ICD10-J43.9). Electronically Signed   By: Lajean Manes M.D.   On: 06/05/2017 14:59    Assessment and Plan:   1.  Chronic systolic CHF: -LVEF 96-29% status post MVR with ongoing failure symptoms -Plan per last office note, plan was  to optimize medical therapy. She is not a candidate for ACE I or ARB due to history of angioedema on lisinopril,  TID and metoprolol was continued with consideration of adding Aldactone at a later time  -Given her current symptoms of fluid volume overload, we will diuresis with IV Lasix 80 mg twice daily and monitor response -Given MVR, will monitor with repeat BNP once she is optimal  -Will monitor renal status with BMET in AM -KDUR 20mg  BID secondary to IV Lasix -Weight, 180lb on admission; weight at last office visit 180lb on 04/14/2017 -Daily weight and strict I&O  2.  Known CAD: -Status post LIMA-LAD in 11/2014 -Lexiscan Myoview in August showed no ischemia but reduced EF of 40% -Denies anginal pain  -ASA, statin, BB therapy  3.  Status post MVR: -Performed 11/2014 with pericardial tissue valve -Procedure echocardiogram August 2018 showed stable prosthesis with no obvious MR  4.  HTN: -Stable, -Loaded pain changed to BiDil at last office visit 04/14/2017  5.  HLD: -Elevated, persistently high LDL most likely reflecting noncompliance with Lipitor -Last lipid panel  6.  PVD: -Status post left CEA and right external iliac stenting.  Bilateral carotid bruits -Has not followed with vascular in many years -ASA, statin  7.  Tobacco use: -Current smoker 0.5 PPD -Encouraged smoking sensation  8.  Stage 1 lung adenocarcinoma: -Status post resection, 2018   For questions or updates, please contact [facility] Please consult [email or web address] for contact info under Cardiology/STEMI.   SignedKathyrn Drown NP-C [facility] Pager: [phone] 06/05/2017 6:54 PM  I have seen and examined the patient along with Kathyrn Drown NP-C.  I have reviewed the chart, notes and new data.  I agree with NP's note.  Key new complaints: orthopnea and severe exertional dyspnea, improving with diuretics, never developed edema. Denies angina. Key examination changes: S3 present, no rales, no JVD,  no mitral rumble Key new findings / data: K 3.1, echo and Myoview from recent months reviewed  PLAN: Diuresis. Need to establish dry weight. Physical exam devoid of signs of right heart failure and echo useless for assessment of left heart filling pressures due to MVR. Will have to rely on weight and BNP to guide diuresis.   Sanda Klein, MD, [place] [phone] 06/06/2017, 8:19 AM

## 2017-06-05 NOTE — ED Notes (Signed)
Urine sample saved in mini lab.

## 2017-06-05 NOTE — ED Notes (Signed)
Cardiology at bedside.

## 2017-06-05 NOTE — ED Provider Notes (Signed)
[facility] EMERGENCY DEPARTMENT Provider Note   CSN: [id number] Arrival date & time: 06/05/17  7741     History   Chief Complaint Chief Complaint  Patient presents with  . Shortness of Breath    HPI Leah [name] is a 67 y.o. female.  Patient followed by cardiothoracic surgery and cardiology, Dr. Peter Martinique, patient with about a months complaint of shortness of breath.  Seen at [facility] on March 2 for almost identical complaint is today.  Deemed that she had some component perhaps may be congestive heart failure and was treated with Lasix.  For 3 days.  Patient states that the breathing is still getting worse.  She really felt no improvement.  She is having trouble laying flat because she feels even more short of breath.  Having to sleep all propped up.  Patient able to ambulate without any major distress.  Oxygen saturations do drop down some on room air down to the low 90s.  But do not below below 90%.  Patient followed by cardiology known history of coronary artery disease.  History of chronic systolic congestive heart failure.  Status post mitral valve replacement.  Also known to have peripheral vascular disease multiple procedures.  Bilateral carotid artery stenosis.  And essential hypertension.  Regarding the chronic systolic congestive heart failure ejection fraction is 40%.  Patient denies any chest pain.  Patient blood pressure elevated here upon arrival did not take any of her blood pressure medicines this morning.  Patient also had resection of a right lung nodule.  Which was isolated bronchogenic carcinoma.  Patient former smoker.  She quit November 25, 2016.      Past Medical History:  Diagnosis Date  . Anemia   . Bronchogenic lung cancer, right ([place]) 11/29/2016  . CAD (coronary artery disease)    a. s/p LIMA-LAD in 11/2014  . Carotid artery occlusion   . Complication of anesthesia    slow to awaken x 1 maybe 2001  . COPD (chronic obstructive  pulmonary disease) ([place])   . GERD (gastroesophageal reflux disease)    "sometimes" takes Copywriter, advertising or drinks gingerale   . Headache(784.0)   . History of kidney stones   . Hyperlipidemia   . Hypertension   . Leg pain   . Peripheral vascular disease ([place])   . Renal vascular disease 10/26/2014   bilateral stents placed   . Severe mitral regurgitation    a. s/p MVR in 11/2014 with a pericardial tissue valve  . Shortness of breath dyspnea   . Tobacco abuse     Patient Active Problem List   Diagnosis Date Noted  . Bronchogenic lung cancer, right ([place]) 11/29/2016  . Dysphagia 08/26/2016  . Health care maintenance 04/17/2016  . Dizziness 04/17/2016  . Decreased visual acuity 01/17/2016  . Depressed mood 07/27/2015  . S/P MVR (mitral valve replacement) 12/05/2014  . Moderate aortic regurgitation 10/26/2014  . Renal vascular disease 10/26/2014  . Acute on chronic diastolic congestive heart failure ([place]) 10/21/2014  . Tobacco use 10/21/2014  . Diastolic dysfunction, grade 2 by echo June 2016 10/21/2014  . Carpal tunnel syndrome 10/17/2014  . Severe mitral regurgitation 08/25/2014  . Unstable angina ([employer]) 08/22/2014  . Low back pain 07/05/2012  . PVC (premature ventricular contraction) 10/02/2011  . History of angioedema with ACE 2013 06/14/2011  . PVD- s/p multiple proceedures 06/04/2011  . Hyperlipidemia 09/28/2008  . Essential hypertension, benign 09/28/2008  . CAD S/P LAD DES 2009 with 70%  ISR 08/24/14 09/28/2008    Past Surgical History:  Procedure Laterality Date  . ABDOMINAL HYSTERECTOMY    . ANGIOPLASTY / STENTING ILIAC  2010   right external iliac by Dr. Irish Lack  . CARDIAC CATHETERIZATION  11/13/11   Left Heart Cath. with Coronary Angiogram  . CARDIAC CATHETERIZATION N/A 08/24/2014   Procedure: Left Heart Cath and Coronary Angiography;  Surgeon: Wellington Hampshire, MD;  Location: [facility] CV LAB;  Service: Cardiovascular;  Laterality: N/A;  . CARDIAC CATHETERIZATION  N/A 10/26/2014   Procedure: Right/Left Heart Cath and Coronary Angiography;  Surgeon: Peter M Martinique, MD; oLAD 70%, mLAD 70% ISR, D2 30%, OFC 30%, RCA 20%, EF nl, low R heart pressures after diuresis, severe MR  . CAROTID ENDARTERECTOMY  11/21/2007   left  . COLONOSCOPY W/ POLYPECTOMY    . CORONARY ANGIOPLASTY WITH STENT PLACEMENT  6/09   LAD 2.5x12 Promus  . CORONARY ARTERY BYPASS GRAFT N/A 12/05/2014   Procedure: CORONARY ARTERY BYPASS GRAFTING (CABG);  Surgeon: [name], MD;  Location: [facility];  Service: Open Heart Surgery;  Laterality: N/A;  Times 1 using left internal mammary artery to LAD  . FEMORAL-POPLITEAL BYPASS GRAFT  10/12   left Dr. Kellie Simmering  . MITRAL VALVE REPAIR  2016  . MITRAL VALVE REPLACEMENT N/A 12/05/2014   Procedure: MITRAL VALVE (MV) REPLACEMENT;  Surgeon: [name], MD;  Location: [place];  Service: Open Heart Surgery;  Laterality: N/A;  Closure left atrial appendage  . RENAL ARTERY STENT Bilateral   . TEE WITHOUT CARDIOVERSION N/A 10/24/2014   Procedure: TRANSESOPHAGEAL ECHOCARDIOGRAM (TEE);  Surgeon: Thayer Headings, MD;  Location: [place];  Service: Cardiovascular;  Laterality: N/A;  . TEE WITHOUT CARDIOVERSION N/A 12/05/2014   Procedure: TRANSESOPHAGEAL ECHOCARDIOGRAM (TEE);  Surgeon: [name], MD;  Location: [place];  Service: Open Heart Surgery;  Laterality: N/A;  . VIDEO ASSISTED THORACOSCOPY (VATS)/WEDGE RESECTION Right 11/29/2016   Procedure: VIDEO ASSISTED THORACOSCOPY (VATS)/ RUL WEDGE RESECTION OF LESION/ NODE SAMPLING;  Surgeon: [name], MD;  Location: [facility];  Service: Thoracic;  Laterality: Right;  Marland Kitchen VIDEO BRONCHOSCOPY N/A 11/29/2016   Procedure: VIDEO BRONCHOSCOPY;  Surgeon: [name], MD;  Location: [facility] OR;  Service: Thoracic;  Laterality: N/A;    OB History    No data available       Home Medications    Prior to Admission medications   Medication Sig Start Date End Date Taking? Authorizing Provider  albuterol  (PROVENTIL HFA;VENTOLIN HFA) 108 (90 Base) MCG/ACT inhaler Inhale 2 puffs into the lungs every 6 (six) hours as needed for wheezing or shortness of breath. 02/20/16   Archie Patten, MD  aspirin EC 81 MG tablet Take 1 tablet (81 mg total) by mouth daily. 07/24/16   Archie Patten, MD  atorvastatin (LIPITOR) 80 MG tablet Take 1 tablet (80 mg total) by mouth daily. 04/14/17   Martinique, Peter M, MD  benzonatate (TESSALON) 100 MG capsule Take 1 capsule (100 mg total) by mouth 3 (three) times daily as needed for cough. 05/24/17   Duffy Bruce, MD  cyclobenzaprine (FLEXERIL) 10 MG tablet Take 1 TABLET BY MOUTH THREE TIMES DAILY AS NEEDED for muscle spasms 01/06/17   Guadalupe Dawn, MD  cyclobenzaprine (FLEXERIL) 10 MG tablet Take 1 tablet (10 mg total) by mouth 3 (three) times daily as needed. for muscle spams 04/15/17   Guadalupe Dawn, MD  doxycycline (VIBRAMYCIN) 100 MG capsule Take 1 capsule (100 mg total) by  mouth 2 (two) times daily. 05/24/17   Duffy Bruce, MD  furosemide (LASIX) 20 MG tablet Take 1 tablet (20 mg total) by mouth daily for 3 days. 05/24/17 05/27/17  Duffy Bruce, MD  isosorbide-hydrALAZINE (BIDIL) 20-37.5 MG tablet Take 1 tablet by mouth 3 (three) times daily. 04/14/17   Martinique, Peter M, MD  metoprolol tartrate (LOPRESSOR) 25 MG tablet Take 1 tablet (25 mg total) by mouth 2 (two) times daily. 04/14/17   Martinique, Peter M, MD  nitroGLYCERIN (NITROSTAT) 0.4 MG SL tablet Place 1 tablet (0.4 mg total) under the tongue every 5 (five) minutes x 3 doses as needed for chest pain. 10/28/14   Barrett, Evelene Croon, PA-C  potassium chloride SA (K-DUR,KLOR-CON) 20 MEQ tablet Take 1 tablet (20 mEq total) by mouth 2 (two) times daily for 3 days. 05/24/17 05/27/17  Duffy Bruce, MD  traMADol (ULTRAM) 50 MG tablet Take 1 tablet (50 mg total) by mouth every 6 (six) hours as needed for severe pain. for pain 04/10/17   Guadalupe Dawn, MD  traZODone (DESYREL) 100 MG tablet take 1 TABLET BY MOUTH at bedtime AS  NEEDED for sleep 05/05/17   Guadalupe Dawn, MD  simvastatin (ZOCOR) 40 MG tablet Take 40 mg by mouth at bedtime.  11/24/10 06/14/11  [provider]    Family History Family History  Problem Relation Age of Onset  . Cancer Mother        BRAIN AND LUNG  . Hypertension Mother   . Hypertension Father   . Heart disease Father   . Prostate cancer Father   . Kidney disease Father   . Hypertension Brother   . ALS Brother   . Stroke Paternal Aunt        great aunt  . Heart attack Neg Hx   . Colon cancer Neg Hx   . Stomach cancer Neg Hx   . Rectal cancer Neg Hx   . Esophageal cancer Neg Hx   . Liver cancer Neg Hx     Social History Social History   Tobacco Use  . Smoking status: Former Smoker    Packs/day: 0.50    Years: 35.00    Pack years: 17.50    Types: Cigarettes    Last attempt to quit: 11/25/2016    Years since quitting: 0.5  . Smokeless tobacco: Never Used  . Tobacco comment: has not started wellbutrin yet  Substance Use Topics  . Alcohol use: No    Alcohol/week: 0.0 oz  . Drug use: No     Allergies   Lisinopril; Aspirin; Chantix [varenicline]; and Penicillins   Review of Systems Review of Systems  Constitutional: Negative for fever.  HENT: Negative for congestion.   Eyes: Negative for redness.  Respiratory: Positive for shortness of breath.   Cardiovascular: Positive for leg swelling. Negative for chest pain.  Gastrointestinal: Negative for abdominal pain.  Genitourinary: Negative for dysuria.  Musculoskeletal: Negative for back pain.  Skin: Negative for rash.  Neurological: Negative for headaches.  Hematological: Does not bruise/bleed easily.  Psychiatric/Behavioral: Negative for confusion.     Physical Exam Updated Vital Signs BP 114/78   Pulse 90   Temp 97.6 F (36.4 C) (Oral)   Resp 19   SpO2 97%   Physical Exam  Constitutional: She is oriented to person, place, and time. She appears well-developed and well-nourished. No distress.    HENT:  Head: Normocephalic and atraumatic.  Mouth/Throat: Oropharynx is clear and moist.  Eyes: EOM are normal. Pupils are equal,  round, and reactive to light.  Neck: Neck supple.  Cardiovascular: Normal rate and regular rhythm.  Pulmonary/Chest: Effort normal and breath sounds normal. No respiratory distress. She has no rales.  Abdominal: Soft. Bowel sounds are normal. There is no tenderness.  Musculoskeletal: She exhibits edema.  Neurological: She is alert and oriented to person, place, and time. No cranial nerve deficit or sensory deficit. She exhibits normal muscle tone. Coordination normal.  Skin: Skin is warm.     ED Treatments / Results  Labs (all labs ordered are listed, but only abnormal results are displayed) Labs Reviewed  BASIC METABOLIC PANEL - Abnormal; Notable for the following components:      Result Value   Potassium 3.1 (*)    CO2 20 (*)    Glucose, Bld 114 (*)    Creatinine, Ser 1.07 (*)    GFR calc non Af Amer 54 (*)    All other components within normal limits  CBC - Abnormal; Notable for the following components:   WBC 10.6 (*)    Hemoglobin 11.1 (*)    HCT 35.4 (*)    MCH 24.4 (*)    RDW 16.9 (*)    All other components within normal limits  BRAIN NATRIURETIC PEPTIDE - Abnormal; Notable for the following components:   B Natriuretic Peptide 2,704.4 (*)    All other components within normal limits  I-STAT TROPONIN, ED    EKG  EKG Interpretation  Date/Time:  Thursday June 05 2017 09:07:03 EDT Ventricular Rate:  101 PR Interval:  146 QRS Duration: 84 QT Interval:  382 QTC Calculation: 495 R Axis:   88 Text Interpretation:  Sinus tachycardia Biatrial enlargement Left ventricular hypertrophy Nonspecific T wave abnormality Abnormal ECG Confirmed by Fredia Sorrow [phone]) on 06/05/2017 12:27:07 PM       Radiology Dg Chest 2 View  Result Date: 06/05/2017 CLINICAL DATA:  Shortness of breath EXAM: CHEST - 2 VIEW COMPARISON:  05/24/2017  FINDINGS: Chronic cardiopericardial enlargement. Status post mitral valve replacement. Mild aortic tortuosity that is stable. Small right pleural effusion. No pulmonary edema or air bronchograms. Postoperative right apex. IMPRESSION: 1. Chronic cardiomegaly and small right pleural effusion. Negative for edema or consolidation. 2. Postoperative right upper lobe. Electronically Signed   By: Monte Fantasia M.D.   On: 06/05/2017 09:36   Ct Angio Chest Pe W/cm &/or Wo Cm  Result Date: 06/05/2017 CLINICAL DATA:  Right-sided chest pain with shortness of breath beginning last night. Associated dry cough. EXAM: CT ANGIOGRAPHY CHEST WITH CONTRAST TECHNIQUE: Multidetector CT imaging of the chest was performed using the standard protocol during bolus administration of intravenous contrast. Multiplanar CT image reconstructions and MIPs were obtained to evaluate the vascular anatomy. CONTRAST:  184mL ISOVUE-370 IOPAMIDOL (ISOVUE-370) INJECTION 76% COMPARISON:  Current chest radiograph.  Chest CT, 11/12/2016. FINDINGS: Cardiovascular: Satisfactory opacification of the pulmonary arteries to the segmental level. No evidence of pulmonary embolism. Heart is mildly enlarged. There changes from previous cardiac surgery and mitral valve replacement. Three-vessel coronary artery calcifications are noted. No pericardial effusion. There is prominence of the descending thoracic aorta which is stable from prior exam. Aortic atherosclerotic calcifications are noted. Mediastinum/Nodes: No neck base, axillary, mediastinal or hilar masses or adenopathy. Trachea is normal in caliber and widely patent. Esophagus is unremarkable. Lungs/Pleura: Small right pleural effusion. Pulmonary anastomosis staples are noted in the right upper lobe associated with scarring, new since the prior CT, subsequent to resection of the previously seen small right upper lobe nodule. There is peripheral  interstitial thickening most evident in the right lung. There is  mild dependent subsegmental atelectasis. There is subtle ground-glass type opacity in the right lower lobe which could be due to atelectasis and/or air trapping. Infection is possible. No other evidence of pneumonia. There are changes of mild centrilobular emphysema. No lung masses or suspicious nodules. Upper Abdomen: No acute abnormality. Musculoskeletal: No fracture or acute finding. No osteoblastic or osteolytic lesions. Review of the MIP images confirms the above findings. IMPRESSION: 1. Subtle ground-glass opacity in the right lower lobe could reflect pneumonia but is more likely due to atelectasis/air trapping. No other evidence of pneumonia. 2. Mild interstitial thickening is noted most evident in the right lung, which may reflect mild asymmetric interstitial pulmonary edema from congestive heart failure. 3. Small right pleural effusion. 4. Patient has had a wedge resection of the right upper lobe removing the previously seen pulmonary nodule. 5. Mild centrilobular emphysema. 6. Cardiomegaly and changes from mitral valve replacement. Heart is larger than it was on the prior CT. Aortic Atherosclerosis (ICD10-I70.0) and Emphysema (ICD10-J43.9). Electronically Signed   By: Lajean Manes M.D.   On: 06/05/2017 14:59    Procedures Procedures (including critical care time)  Medications Ordered in ED Medications  isosorbide-hydrALAZINE (BIDIL) 20-37.5 MG per tablet 1 tablet (1 tablet Oral Given 06/05/17 1332)  metoprolol tartrate (LOPRESSOR) tablet 25 mg (25 mg Oral Given 06/05/17 1332)  iopamidol (ISOVUE-370) 76 % injection (100 mLs  Contrast Given 06/05/17 1424)     Initial Impression / Assessment and Plan / ED Course  I have reviewed the triage vital signs and the nursing notes.  Pertinent labs & imaging results that were available during my care of the patient were reviewed by me and considered in my medical decision making (see chart for details).    Patient clinically in no distress.  Able to  ambulate.  The patient's BNP has doubled since the visit on March second.  Patient was given 3-day course of Lasix at that time.  Patient still very symptomatic if not more symptomatic.  Certainly not able to lay flat.  CT angios chest ruled out pulmonary embolus but does raise concerns for some fluid on the lungs and there is a small pleural effusion.  Does raise some question of pneumonia but clinically I do not think patient necessarily has pneumonia no fevers although white count 10.6.  The doubling of the BNP think is the cause of her symptoms.  Will discuss with cardiology.  Patient not normally on Lasix.  Patient was hypertensive when she arrived gave her blood pressure medicines and now it is improved significantly.  Feel patient's symptoms are due to a worsening of the CHF.  Patient given 80 mg of Lasix IV.  Patient could possibly benefit from inpatient admission and being tuned up.   Final Clinical Impressions(s) / ED Diagnoses   Final diagnoses:  Acute on chronic systolic congestive heart failure (HCC)  SOB (shortness of breath)  Essential hypertension    ED Discharge Orders    None       Fredia Sorrow, MD 06/05/17 1559

## 2017-06-05 NOTE — ED Notes (Signed)
Attempted report x1. 

## 2017-06-06 ENCOUNTER — Encounter: Payer: Self-pay | Admitting: Cardiology

## 2017-06-06 DIAGNOSIS — I5043 Acute on chronic combined systolic (congestive) and diastolic (congestive) heart failure: Secondary | ICD-10-CM

## 2017-06-06 DIAGNOSIS — Z7982 Long term (current) use of aspirin: Secondary | ICD-10-CM | POA: Diagnosis not present

## 2017-06-06 DIAGNOSIS — Z23 Encounter for immunization: Secondary | ICD-10-CM | POA: Diagnosis not present

## 2017-06-06 DIAGNOSIS — K219 Gastro-esophageal reflux disease without esophagitis: Secondary | ICD-10-CM | POA: Diagnosis present

## 2017-06-06 DIAGNOSIS — Z951 Presence of aortocoronary bypass graft: Secondary | ICD-10-CM | POA: Diagnosis not present

## 2017-06-06 DIAGNOSIS — R0602 Shortness of breath: Secondary | ICD-10-CM | POA: Diagnosis not present

## 2017-06-06 DIAGNOSIS — J449 Chronic obstructive pulmonary disease, unspecified: Secondary | ICD-10-CM | POA: Diagnosis present

## 2017-06-06 DIAGNOSIS — Z79899 Other long term (current) drug therapy: Secondary | ICD-10-CM | POA: Diagnosis not present

## 2017-06-06 DIAGNOSIS — Z952 Presence of prosthetic heart valve: Secondary | ICD-10-CM | POA: Diagnosis not present

## 2017-06-06 DIAGNOSIS — Z85118 Personal history of other malignant neoplasm of bronchus and lung: Secondary | ICD-10-CM | POA: Diagnosis not present

## 2017-06-06 DIAGNOSIS — Z955 Presence of coronary angioplasty implant and graft: Secondary | ICD-10-CM | POA: Diagnosis not present

## 2017-06-06 DIAGNOSIS — I110 Hypertensive heart disease with heart failure: Secondary | ICD-10-CM | POA: Diagnosis not present

## 2017-06-06 DIAGNOSIS — Z9114 Patient's other noncompliance with medication regimen: Secondary | ICD-10-CM | POA: Diagnosis not present

## 2017-06-06 DIAGNOSIS — I10 Essential (primary) hypertension: Secondary | ICD-10-CM | POA: Diagnosis not present

## 2017-06-06 DIAGNOSIS — J9811 Atelectasis: Secondary | ICD-10-CM | POA: Diagnosis present

## 2017-06-06 DIAGNOSIS — I739 Peripheral vascular disease, unspecified: Secondary | ICD-10-CM | POA: Diagnosis present

## 2017-06-06 DIAGNOSIS — I5023 Acute on chronic systolic (congestive) heart failure: Secondary | ICD-10-CM | POA: Diagnosis not present

## 2017-06-06 DIAGNOSIS — I2510 Atherosclerotic heart disease of native coronary artery without angina pectoris: Secondary | ICD-10-CM | POA: Diagnosis present

## 2017-06-06 DIAGNOSIS — E785 Hyperlipidemia, unspecified: Secondary | ICD-10-CM | POA: Diagnosis present

## 2017-06-06 DIAGNOSIS — F17210 Nicotine dependence, cigarettes, uncomplicated: Secondary | ICD-10-CM | POA: Diagnosis present

## 2017-06-06 LAB — BASIC METABOLIC PANEL
Anion gap: 12 (ref 5–15)
BUN: 13 mg/dL (ref 6–20)
CALCIUM: 9.3 mg/dL (ref 8.9–10.3)
CO2: 23 mmol/L (ref 22–32)
Chloride: 106 mmol/L (ref 101–111)
Creatinine, Ser: 1.12 mg/dL — ABNORMAL HIGH (ref 0.44–1.00)
GFR calc Af Amer: 59 mL/min — ABNORMAL LOW (ref >60)
GFR calc non Af Amer: 51 mL/min — ABNORMAL LOW (ref >60)
GLUCOSE: 83 mg/dL (ref 65–99)
Potassium: 4.7 mmol/L (ref 3.5–5.1)
Sodium: 141 mmol/L (ref 135–145)

## 2017-06-06 LAB — HIV ANTIBODY (ROUTINE TESTING W REFLEX): HIV Screen 4th Generation wRfx: NONREACTIVE

## 2017-06-06 MED ORDER — POTASSIUM CHLORIDE CRYS ER 20 MEQ PO TBCR
40.0000 meq | EXTENDED_RELEASE_TABLET | ORAL | Status: AC
  Administered 2017-06-06 (×2): 40 meq via ORAL
  Filled 2017-06-06 (×2): qty 2

## 2017-06-06 NOTE — Progress Notes (Signed)
Nutrition Brief Note  Patient identified on the Malnutrition Screening Tool (MST) Report. Also received consult for new onset heart failure.  Wt Readings from Last 15 Encounters:  06/06/17 170 lb 8 oz (77.3 kg)  04/14/17 180 lb 4 oz (81.8 kg)  03/20/17 186 lb 6.4 oz (84.6 kg)  12/19/16 177 lb (80.3 kg)  11/30/16 176 lb 9.4 oz (80.1 kg)  11/27/16 177 lb (80.3 kg)  11/20/16 178 lb (80.7 kg)  11/18/16 178 lb (80.7 kg)  11/14/16 178 lb (80.7 kg)  11/01/16 178 lb (80.7 kg)  09/26/16 180 lb (81.6 kg)  08/26/16 183 lb (83 kg)  07/24/16 184 lb 6.4 oz (83.6 kg)  06/05/16 189 lb (85.7 kg)  05/23/16 189 lb (85.7 kg)   Leah [name] is a 67 year old female with a history as stated above presented to [facility] Department on 06/05/2017 with complaints of shortness of breath over the last several months.   Pt admitted with CHF.   Pt sleeping soundly at time of visit. Blankets covering head and pt did not respond to name being called.   Reviewed cardiology notes, who reports inaccurate I/O's and suspects most recent wt inaccurate. Suspect some wt loss related to diuresis.   Pt with good appetite; noted 100% meal completion.   Body mass index is 21.89 kg/m. Patient meets criteria for normal weight range based on current BMI.   Current diet order is Heart Healthy, patient is consuming approximately 100% of meals at this time. Labs and medications reviewed.   No nutrition interventions warranted at this time. If nutrition issues arise, please consult RD.   Leah [name] A. Jimmye Norman, RD, LDN, CDE Pager: [phone] After hours Pager: [phone]

## 2017-06-06 NOTE — Progress Notes (Addendum)
Progress Note  Patient Name: Leah [name] Date of Encounter: 06/06/2017  Primary Cardiologist: Peter Martinique, MD   Subjective   Denies any more SOB, no CP.   Inpatient Medications    Scheduled Meds: . aspirin EC  81 mg Oral Daily  . atorvastatin  80 mg Oral q1800  . furosemide  80 mg Intravenous BID  . heparin  5,000 Units Subcutaneous Q8H  . isosorbide-hydrALAZINE  1 tablet Oral TID  . metoprolol tartrate  25 mg Oral BID  . potassium chloride SA  20 mEq Oral BID  . sodium chloride flush  3 mL Intravenous Q12H   Continuous Infusions: . sodium chloride     PRN Meds: sodium chloride, acetaminophen, ondansetron (ZOFRAN) IV, sodium chloride flush   Vital Signs    Vitals:   06/06/17 0856 06/06/17 1155 06/06/17 1205 06/06/17 1216  BP: (!) 114/98 (!) 81/60 96/67 96/62   Pulse: 78 61 (!) 59   Resp:  18    Temp: (!) 97.5 F (36.4 C) (!) 97.5 F (36.4 C)    TempSrc: Oral Oral    SpO2: 97% 100%    Weight:      Height:        Intake/Output Summary (Last 24 hours) at 06/06/2017 1417 Last data filed at 06/06/2017 1128 Gross per 24 hour  Intake 1267 ml  Output 1100 ml  Net 167 ml   Filed Weights   06/05/17 2134 06/06/17 0455  Weight: 171 lb 3.2 oz (77.7 kg) 170 lb 8 oz (77.3 kg)    Telemetry    NSR without significant ventricular ectopy - Personally Reviewed  ECG    Mild sinus tachycardia, nonspecific changes - Personally Reviewed  Physical Exam   GEN: No acute distress.   Neck: No JVD Cardiac: RRR, no murmurs, rubs, or gallops.  Respiratory: Clear to auscultation bilaterally. GI: Soft, nontender, non-distended  MS: No edema; No deformity. Neuro:  Nonfocal  Psych: Normal affect   Labs    Chemistry Recent Labs  Lab 06/05/17 0913 06/05/17 2228 06/06/17 0727  NA 138 141 141  K 3.1* 2.9* 4.7  CL 105 101 106  CO2 20* 25 23  GLUCOSE 114* 127* 83  BUN 9 11 13   CREATININE 1.07* 1.14* 1.12*  CALCIUM 9.2 9.9 9.3  GFRNONAA 54* 50* 51*  GFRAA >60  58* 59*  ANIONGAP 13 15 12      Hematology Recent Labs  Lab 06/05/17 0913 06/05/17 2228  WBC 10.6* 11.8*  RBC 4.54 5.14*  HGB 11.1* 12.9  HCT 35.4* 40.1  MCV 78.0 78.0  MCH 24.4* 25.1*  MCHC 31.4 32.2  RDW 16.9* 17.3*  PLT 190 210    Cardiac EnzymesNo results for input(s): TROPONINI in the last 168 hours.  Recent Labs  Lab 06/05/17 0929  TROPIPOC 0.05     BNP Recent Labs  Lab 06/05/17 0913  BNP 2,704.4*     DDimer No results for input(s): DDIMER in the last 168 hours.   Radiology    Dg Chest 2 View  Result Date: 06/05/2017 CLINICAL DATA:  Shortness of breath EXAM: CHEST - 2 VIEW COMPARISON:  05/24/2017 FINDINGS: Chronic cardiopericardial enlargement. Status post mitral valve replacement. Mild aortic tortuosity that is stable. Small right pleural effusion. No pulmonary edema or air bronchograms. Postoperative right apex. IMPRESSION: 1. Chronic cardiomegaly and small right pleural effusion. Negative for edema or consolidation. 2. Postoperative right upper lobe. Electronically Signed   By: Monte Fantasia M.D.   On: 06/05/2017 09:36  Ct Angio Chest Pe W/cm &/or Wo Cm  Result Date: 06/05/2017 CLINICAL DATA:  Right-sided chest pain with shortness of breath beginning last night. Associated dry cough. EXAM: CT ANGIOGRAPHY CHEST WITH CONTRAST TECHNIQUE: Multidetector CT imaging of the chest was performed using the standard protocol during bolus administration of intravenous contrast. Multiplanar CT image reconstructions and MIPs were obtained to evaluate the vascular anatomy. CONTRAST:  119mL ISOVUE-370 IOPAMIDOL (ISOVUE-370) INJECTION 76% COMPARISON:  Current chest radiograph.  Chest CT, 11/12/2016. FINDINGS: Cardiovascular: Satisfactory opacification of the pulmonary arteries to the segmental level. No evidence of pulmonary embolism. Heart is mildly enlarged. There changes from previous cardiac surgery and mitral valve replacement. Three-vessel coronary artery calcifications are  noted. No pericardial effusion. There is prominence of the descending thoracic aorta which is stable from prior exam. Aortic atherosclerotic calcifications are noted. Mediastinum/Nodes: No neck base, axillary, mediastinal or hilar masses or adenopathy. Trachea is normal in caliber and widely patent. Esophagus is unremarkable. Lungs/Pleura: Small right pleural effusion. Pulmonary anastomosis staples are noted in the right upper lobe associated with scarring, new since the prior CT, subsequent to resection of the previously seen small right upper lobe nodule. There is peripheral interstitial thickening most evident in the right lung. There is mild dependent subsegmental atelectasis. There is subtle ground-glass type opacity in the right lower lobe which could be due to atelectasis and/or air trapping. Infection is possible. No other evidence of pneumonia. There are changes of mild centrilobular emphysema. No lung masses or suspicious nodules. Upper Abdomen: No acute abnormality. Musculoskeletal: No fracture or acute finding. No osteoblastic or osteolytic lesions. Review of the MIP images confirms the above findings. IMPRESSION: 1. Subtle ground-glass opacity in the right lower lobe could reflect pneumonia but is more likely due to atelectasis/air trapping. No other evidence of pneumonia. 2. Mild interstitial thickening is noted most evident in the right lung, which may reflect mild asymmetric interstitial pulmonary edema from congestive heart failure. 3. Small right pleural effusion. 4. Patient has had a wedge resection of the right upper lobe removing the previously seen pulmonary nodule. 5. Mild centrilobular emphysema. 6. Cardiomegaly and changes from mitral valve replacement. Heart is larger than it was on the prior CT. Aortic Atherosclerosis (ICD10-I70.0) and Emphysema (ICD10-J43.9). Electronically Signed   By: Lajean Manes M.D.   On: 06/05/2017 14:59    Cardiac Studies   Echo 01/13/2017 LV EF:  40%  Study Conclusions  - Left ventricle: The cavity size was normal. Wall thickness was   normal. Systolic function was mildly to moderately reduced. The   estimated ejection fraction was 40%. Diffuse hypokinesis. There   was a reduced contribution of atrial contraction to ventricular   filling, due to increased ventricular diastolic pressure or   atrial contractile dysfunction. Doppler parameters are consistent   with high ventricular filling pressure. - Aortic valve: There was mild regurgitation. - Mitral valve: A bioprosthesis was present. with normal function.   Mean gradient (D): 2 mm Hg. Peak gradient (D): 10 mm Hg. - Left atrium: The atrium was moderately dilated. - Pulmonary arteries: PA peak pressure: 38 mm Hg (S).   Patient Profile     67 y.o. female hx of CAD (s/p LIMA-LAD in 11/2014), MVR (in 11/2014 with pericardial tissue valve), HTN, HLD, COPD, PVD (s/p L CEA and right external iliac stenting), and CHF presented with acute on chronic systolic HF  Assessment & Plan    1. Acute on Chronic systolic HF  - questionable accuracy of I/O, only decreased  by 1 lbs overnight.   - currently on 80mg  BID IV lasix. She dose not look volume overloaded based on physical exam today, likely near euvolemic level  - I am in favor of give her 1 more dose of IV lasix, potentially transition to PO lasix tomorrow. Likely 40mg  daily. Likely discharge tomorrow.   2. CAD s/p LIMA to LAD 11/2014: myoview in 10/2016 no ischemia, EF 40%  3. S/p MVR: 11/2014 with pericardial tissue valve: stable on last echo  4. HTN: continue BiDil, consider switch her metoprolol tartrate to metopolol succinate 50mg  daily given LV dysfunction.   5. HLD: LDL 176 on 11/18/2016, on lipitor 80mg , questionable compliance  6. PAD: Status post left CEA and right external iliac stenting.  7. Lung CA s/p resection 2018.    For questions or updates, please contact [place] Please consult [email or web address] for contact  info under Cardiology/STEMI.      Hilbert Corrigan, PA  06/06/2017, 2:17 PM    I have seen and examined the patient along with Almyra Deforest, PA .  I have reviewed the chart, notes and new data.  I agree with PA/NP's note.  Key new complaints: improved subjectively - no chest pain or dyspnea at rest Key examination changes: no rumble, no AI murmur, clear lungs Key new findings / data: creatinine unchanged  PLAN: I agree that in/out are not accurately recorded (she was in ED using restroom several times when we were evaluating her- output not recorded. Also not sure about her weight. The weight recorded yesterday was just a verbal estimate. Very unsure of her dry weight. Cannot use echo parameters to guide diuresis due to MV replacement. She has never had signs of R heart failure, but dyspnea better with diuretics. I would keep diuresing until her renal function budges.  Sanda Klein, MD, [place] [phone] 06/06/2017, 3:24 PM

## 2017-06-06 NOTE — Progress Notes (Addendum)
Potassium 2.9.  Paged MD Cardiology on call.  PO potassium 20 meq PO given 5 minutes before blood draw.   Verbal order for addition 80 meq potassium with read back given per MD Chakravartti. Will continue to monitor.     Ayaan Shutes, RN

## 2017-06-07 DIAGNOSIS — I10 Essential (primary) hypertension: Secondary | ICD-10-CM

## 2017-06-07 LAB — BASIC METABOLIC PANEL
ANION GAP: 11 (ref 5–15)
BUN: 11 mg/dL (ref 6–20)
CHLORIDE: 101 mmol/L (ref 101–111)
CO2: 27 mmol/L (ref 22–32)
Calcium: 9.2 mg/dL (ref 8.9–10.3)
Creatinine, Ser: 1.09 mg/dL — ABNORMAL HIGH (ref 0.44–1.00)
GFR calc Af Amer: >60 mL/min (ref >60)
GFR, EST NON AFRICAN AMERICAN: 52 mL/min — AB (ref >60)
GLUCOSE: 92 mg/dL (ref 65–99)
POTASSIUM: 3.3 mmol/L — AB (ref 3.5–5.1)
Sodium: 139 mmol/L (ref 135–145)

## 2017-06-07 MED ORDER — METOPROLOL SUCCINATE ER 25 MG PO TB24: 25.0000 mg | ORAL_TABLET | Freq: Every day | ORAL | 3 refills | Status: DC

## 2017-06-07 MED ORDER — METOPROLOL SUCCINATE ER 25 MG PO TB24: 25.0000 mg | ORAL_TABLET | Freq: Every day | ORAL | Status: DC

## 2017-06-07 MED ORDER — FUROSEMIDE 40 MG PO TABS: 40.0000 mg | ORAL_TABLET | Freq: Every day | ORAL | 6 refills | Status: DC

## 2017-06-07 MED ORDER — POTASSIUM CHLORIDE CRYS ER 20 MEQ PO TBCR
40.0000 meq | EXTENDED_RELEASE_TABLET | Freq: Once | ORAL | Status: AC
  Administered 2017-06-07: 40 meq via ORAL
  Filled 2017-06-07: qty 2

## 2017-06-07 MED ORDER — POTASSIUM CHLORIDE CRYS ER 20 MEQ PO TBCR: 40.0000 meq | EXTENDED_RELEASE_TABLET | Freq: Every day | ORAL | 1 refills | Status: DC

## 2017-06-07 MED ORDER — NITROGLYCERIN 0.4 MG SL SUBL: 0.4000 mg | SUBLINGUAL_TABLET | SUBLINGUAL | 12 refills | Status: DC | PRN

## 2017-06-07 NOTE — Care Management Note (Signed)
Case Management Note  Patient Details  Name: Leah [name] MRN: [id number] Date of Birth: 1951/01/30  Subjective/Objective:   CHF                Action/Plan: Patient lives at home with her family; PCP is Dr Kris Mouton with [facility]; has private insurance with Medicaid with prescription drug coverage; pharmacy of choice is [facility] on [address]; DME - none at this time; her family takes her to her apts; she cooks with salt; CM talked to patient about the importance of restricting her salt intake; Nutritional consult placed for educational needs; she plans to go to [facility] to purchase a scale to weigh herself daily;she also stated that she plans to walk more. CM will continue to follow for progression of care.  Expected Discharge Date:  06/07/17 possibly              Expected Discharge Plan:  Home/Self Care  In-House Referral:   Nutritional Consult  Discharge planning Services  CM Consult  Status of Service:  In process, will continue to follow  Sherrilyn Rist [phone] 06/07/2017, 10:51 AM

## 2017-06-07 NOTE — Discharge Summary (Addendum)
Discharge Summary    Patient ID: Leah [name],  MRN: [id number], DOB/AGE: [birth year] 67 y.o.  Admit date: 06/05/2017 Discharge date: 06/07/2017   Primary Care Provider: Guadalupe Dawn Primary Cardiologist: Dr. Martinique  Discharge Diagnoses    Principal Problem:   Acute on chronic diastolic congestive heart failure [facility]) Active Problems:   Hyperlipidemia   Essential hypertension, benign   CAD S/P LAD DES 2009 with 70% ISR 08/24/14   PVD- s/p multiple proceedures   Severe mitral regurgitation   S/P MVR (mitral valve replacement)   CHF (congestive heart failure) (HCC)   Allergies Allergies  Allergen Reactions  . Lisinopril Swelling    Angioedema 06/10/11  . Chantix [Varenicline] Other (See Comments)    Caused insomnia  . Penicillins Hives    Has patient had a PCN reaction causing immediate rash, facial/tongue/throat swelling, SOB or lightheadedness with hypotension: Yes Has patient had a PCN reaction causing severe rash involving mucus membranes or skin necrosis: No Has patient had a PCN reaction that required hospitalization: No Has patient had a PCN reaction occurring within the last 10 years: No If all of the above answers are "NO", then may proceed with Cephalosporin use.      History of Present Illness     Leah [name] is a 67 y.o. female with a hx of CAD (s/p LIMA-LAD in 11/2014), MVR (in 11/2014 with pericardial tissue valve), HTN, HLD, COPD, PVD (s/p L CEA and right external iliac stenting), and CHF who is being seen today for the evaluation of CHF exacerbation.  [name] is a 67 year old female with a history as stated above presented to [facility] on 06/05/2017 with complaints of shortness of breath over the last several months. She was last seen at [facility] on 05/24/2017 for similar complaints. At that time, she was treated with IV Lasix 40mg  and several days of PO dosing with good response however, states that her  breathing is still getting worse and has felt no improvement. She has been having trouble with orthopnea and PND, having to sleep upright at night.  She denies chest pain, palpitations, dizziness or syncope.  In the emergency department, a chest x-ray was completed which shows chronic cardiomegaly and small right pleural effusion, negative for edema or consolidation and postoperative right upper lobe. Given her ongoing symptoms, a chest CT Angio was performed which shows subtle groundglass opacity in the right lower lobe which could reflect pneumonia, but is more likely due to atelectasis or air trapping and mild interstitial thickening, and right small pleural effusion. Her BNP is doubled from her last admission in early March at 2,704. An iTrop level was drawn and is 0.05 and she was hypertensive on arrival. She was given her home blood pressure medications, which is now resolved.  Additionally, she was given IV Lasix 80 mg and has been urinating, although this has not been measured. Her current BP is 112/71 and she is stable.  Of note, in 10/2016 she had a preop evaluation with a Myoview study that showed no ischemic and reduced EF. She had a right upper lobe lung nodule which had increased in size. She underwent a wedge resection in September 2018 with pathology showing adenocarcinoma stage I with negative margins.  An echocardiogram was performed 12/2016 which showed a decrease in LVEF at 40% with normally functioning mitral valve prosthesis.  She was last seen by Dr. Martinique 04/14/2017. Cardiology will admit and plan is for diuresis.  Hospital Course     Consultants: none  Acute on chronic systolic heart failure Echo with LVEF 40%. She was admitted to cardiology and diuresed. She is overall net negative 1L with 2.6 L urine output yesterday. Her discharge weight is 168 lbs, down from 171 lbs on admission. She was discharged on 40 mg lasix daily with Kdur 40 mEq daily, per Dr. Rayann Heman. Discharge  creatinine was 1.09, K was 3.3 on 80mg  lasix IV and 20 mEq Kdur BID. She will need a BMP early next week.  CAD s/p LIMA-LAF 11/2014 Lexiscan myoview 10/2016 without ischemia, but reduced EF of 40%. She denies anginal symptoms. Continue ASA, statin, and BB.  MVR 11/2014 Echo 10/2016 stable valve prosthesis. No change.   HTN Started on bidil 04/14/17.   HLD Pt has been noncompliant on lipitor. FLP this visit:  11/18/2016: Cholesterol, Total 246; HDL 51; LDL Calculated 176; Triglycerides 96  Discharged on lipitor 80 mg and encouraged compliance. Recheck in 3 months.  PAD S/p left CEA and right external iliac stenting. Stable.   Patient seen and examined by Dr. Rayann Heman today and was stable for discharge. All follow up has been arranged.  _____________  Discharge Vitals Blood pressure 91/71, pulse 73, temperature 97.6 F (36.4 C), temperature source Oral, resp. rate 18, height 6\' 2"  (1.88 m), weight 168 lb 6.4 oz (76.4 kg), SpO2 97 %.  Filed Weights   06/05/17 2134 06/06/17 0455 06/07/17 0628  Weight: 171 lb 3.2 oz (77.7 kg) 170 lb 8 oz (77.3 kg) 168 lb 6.4 oz (76.4 kg)    Labs & Radiologic Studies    CBC Recent Labs    06/05/17 0913 06/05/17 2228  WBC 10.6* 11.8*  HGB 11.1* 12.9  HCT 35.4* 40.1  MCV 78.0 78.0  PLT 190 784   Basic Metabolic Panel Recent Labs    06/06/17 0727 06/07/17 0521  NA 141 139  K 4.7 3.3*  CL 106 101  CO2 23 27  GLUCOSE 83 92  BUN 13 11  CREATININE 1.12* 1.09*  CALCIUM 9.3 9.2   Liver Function Tests No results for input(s): AST, ALT, ALKPHOS, BILITOT, PROT, ALBUMIN in the last 72 hours. No results for input(s): LIPASE, AMYLASE in the last 72 hours. Cardiac Enzymes No results for input(s): CKTOTAL, CKMB, CKMBINDEX, TROPONINI in the last 72 hours. BNP Invalid input(s): POCBNP D-Dimer No results for input(s): DDIMER in the last 72 hours. Hemoglobin A1C No results for input(s): HGBA1C in the last 72 hours. Fasting Lipid Panel No results  for input(s): CHOL, HDL, LDLCALC, TRIG, CHOLHDL, LDLDIRECT in the last 72 hours. Thyroid Function Tests No results for input(s): TSH, T4TOTAL, T3FREE, THYROIDAB in the last 72 hours.  Invalid input(s): FREET3 _____________  Dg Chest 2 View  Result Date: 06/05/2017 CLINICAL DATA:  Shortness of breath EXAM: CHEST - 2 VIEW COMPARISON:  05/24/2017 FINDINGS: Chronic cardiopericardial enlargement. Status post mitral valve replacement. Mild aortic tortuosity that is stable. Small right pleural effusion. No pulmonary edema or air bronchograms. Postoperative right apex. IMPRESSION: 1. Chronic cardiomegaly and small right pleural effusion. Negative for edema or consolidation. 2. Postoperative right upper lobe. Electronically Signed   By: Monte Fantasia M.D.   On: 06/05/2017 09:36   Dg Chest 2 View  Result Date: 05/24/2017 CLINICAL DATA:  Cough, shortness of breath EXAM: CHEST  2 VIEW COMPARISON:  03/20/2017 FINDINGS: Right upper lobe wedge resection. No focal consolidation. No pleural effusion or pneumothorax. Cardiomegaly.  Prosthetic valve. Median sternotomy. IMPRESSION: No evidence of  acute cardiopulmonary disease. Electronically Signed   By: Julian Hy M.D.   On: 05/24/2017 14:50   Ct Angio Chest Pe W/cm &/or Wo Cm  Result Date: 06/05/2017 CLINICAL DATA:  Right-sided chest pain with shortness of breath beginning last night. Associated dry cough. EXAM: CT ANGIOGRAPHY CHEST WITH CONTRAST TECHNIQUE: Multidetector CT imaging of the chest was performed using the standard protocol during bolus administration of intravenous contrast. Multiplanar CT image reconstructions and MIPs were obtained to evaluate the vascular anatomy. CONTRAST:  128mL ISOVUE-370 IOPAMIDOL (ISOVUE-370) INJECTION 76% COMPARISON:  Current chest radiograph.  Chest CT, 11/12/2016. FINDINGS: Cardiovascular: Satisfactory opacification of the pulmonary arteries to the segmental level. No evidence of pulmonary embolism. Heart is mildly  enlarged. There changes from previous cardiac surgery and mitral valve replacement. Three-vessel coronary artery calcifications are noted. No pericardial effusion. There is prominence of the descending thoracic aorta which is stable from prior exam. Aortic atherosclerotic calcifications are noted. Mediastinum/Nodes: No neck base, axillary, mediastinal or hilar masses or adenopathy. Trachea is normal in caliber and widely patent. Esophagus is unremarkable. Lungs/Pleura: Small right pleural effusion. Pulmonary anastomosis staples are noted in the right upper lobe associated with scarring, new since the prior CT, subsequent to resection of the previously seen small right upper lobe nodule. There is peripheral interstitial thickening most evident in the right lung. There is mild dependent subsegmental atelectasis. There is subtle ground-glass type opacity in the right lower lobe which could be due to atelectasis and/or air trapping. Infection is possible. No other evidence of pneumonia. There are changes of mild centrilobular emphysema. No lung masses or suspicious nodules. Upper Abdomen: No acute abnormality. Musculoskeletal: No fracture or acute finding. No osteoblastic or osteolytic lesions. Review of the MIP images confirms the above findings. IMPRESSION: 1. Subtle ground-glass opacity in the right lower lobe could reflect pneumonia but is more likely due to atelectasis/air trapping. No other evidence of pneumonia. 2. Mild interstitial thickening is noted most evident in the right lung, which may reflect mild asymmetric interstitial pulmonary edema from congestive heart failure. 3. Small right pleural effusion. 4. Patient has had a wedge resection of the right upper lobe removing the previously seen pulmonary nodule. 5. Mild centrilobular emphysema. 6. Cardiomegaly and changes from mitral valve replacement. Heart is larger than it was on the prior CT. Aortic Atherosclerosis (ICD10-I70.0) and Emphysema (ICD10-J43.9).  Electronically Signed   By: Lajean Manes M.D.   On: 06/05/2017 14:59     Diagnostic Studies/Procedures    Echo 01/13/17: Study Conclusions - Left ventricle: The cavity size was normal. Wall thickness was   normal. Systolic function was mildly to moderately reduced. The   estimated ejection fraction was 40%. Diffuse hypokinesis. There   was a reduced contribution of atrial contraction to ventricular   filling, due to increased ventricular diastolic pressure or   atrial contractile dysfunction. Doppler parameters are consistent   with high ventricular filling pressure. - Aortic valve: There was mild regurgitation. - Mitral valve: A bioprosthesis was present. with normal function.   Mean gradient (D): 2 mm Hg. Peak gradient (D): 10 mm Hg. - Left atrium: The atrium was moderately dilated. - Pulmonary arteries: PA peak pressure: 38 mm Hg (S).   Disposition   Pt is being discharged home today in good condition.  Follow-up Plans & Appointments    Follow-up Information    [facility] Follow up on 06/10/2017.   Specialty:  Cardiology Why:  BMP lab draw Contact information: [address]  [address] [place]       Martinique, Peter M, MD Follow up in 1 week(s).   Specialty:  Cardiology Contact information: [address] [place] [address] [phone]          Discharge Instructions    Diet - low sodium heart healthy   Complete by:  As directed    Increase activity slowly   Complete by:  As directed       Discharge Medications   Allergies as of 06/07/2017      Reactions   Lisinopril Swelling   Angioedema 06/10/11   Chantix [varenicline] Other (See Comments)   Caused insomnia   Penicillins Hives   Has patient had a PCN reaction causing immediate rash, facial/tongue/throat swelling, SOB or lightheadedness with hypotension: Yes Has patient had a PCN reaction causing severe rash involving mucus  membranes or skin necrosis: No Has patient had a PCN reaction that required hospitalization: No Has patient had a PCN reaction occurring within the last 10 years: No If all of the above answers are "NO", then may proceed with Cephalosporin use.      Medication List    STOP taking these medications   GOODY HEADACHE PO   metoprolol tartrate 25 MG tablet Commonly known as:  LOPRESSOR     TAKE these medications   acetaminophen 325 MG tablet Commonly known as:  TYLENOL Take 325-650 mg by mouth every 6 (six) hours as needed (for headaches).   albuterol 108 (90 Base) MCG/ACT inhaler Commonly known as:  PROVENTIL HFA;VENTOLIN HFA Inhale 2 puffs into the lungs every 6 (six) hours as needed for wheezing or shortness of breath.   aspirin EC 81 MG tablet Take 1 tablet (81 mg total) by mouth daily.   atorvastatin 80 MG tablet Commonly known as:  LIPITOR Take 1 tablet (80 mg total) by mouth daily.   benzonatate 100 MG capsule Commonly known as:  TESSALON Take 1 capsule (100 mg total) by mouth 3 (three) times daily as needed for cough.   cyclobenzaprine 10 MG tablet Commonly known as:  FLEXERIL Take 1 TABLET BY MOUTH THREE TIMES DAILY AS NEEDED for muscle spasms What changed:  Another medication with the same name was removed. Continue taking this medication, and follow the directions you see here.   doxycycline 100 MG capsule Commonly known as:  VIBRAMYCIN Take 1 capsule (100 mg total) by mouth 2 (two) times daily.   furosemide 40 MG tablet Commonly known as:  LASIX Take 1 tablet (40 mg total) by mouth daily. What changed:    medication strength  how much to take   isosorbide-hydrALAZINE 20-37.5 MG tablet Commonly known as:  BIDIL Take 1 tablet by mouth 3 (three) times daily.   metoprolol succinate 25 MG 24 hr tablet Commonly known as:  TOPROL-XL Take 1 tablet (25 mg total) by mouth daily.   nitroGLYCERIN 0.4 MG SL tablet Commonly known as:  NITROSTAT Place 1 tablet  (0.4 mg total) under the tongue every 5 (five) minutes x 3 doses as needed for chest pain.   potassium chloride SA 20 MEQ tablet Commonly known as:  K-DUR,KLOR-CON Take 2 tablets (40 mEq total) by mouth daily. What changed:    how much to take  when to take this   traMADol 50 MG tablet Commonly known as:  ULTRAM Take 1 tablet (50 mg total) by mouth every 6 (six) hours as needed for severe pain. for pain   traZODone 100  MG tablet Commonly known as:  DESYREL take 1 TABLET BY MOUTH at bedtime AS NEEDED for sleep What changed:    how much to take  how to take this  when to take this  additional instructions         Outstanding Labs/Studies   BMP next week. Follow up TCM, office will call with appt.  Duration of Discharge Encounter   Greater than 30 minutes including physician time.  Signed, Tami Lin Duke PA-C 06/07/2017, 12:15 PM  I have seen, examined the patient, and reviewed the above assessment and plan.  Changes to above are made where necessary.    Co Sign: Thompson Grayer, MD 06/07/2017 3:38 PM

## 2017-06-07 NOTE — Progress Notes (Signed)
Progress Note  Patient Name: Leah [name] Date of Encounter: 06/07/2017  Primary Cardiologist: Peter Martinique, MD   Subjective   Denies any more SOB, no CP.   Wants to go home  Inpatient Medications    Scheduled Meds: . aspirin EC  81 mg Oral Daily  . atorvastatin  80 mg Oral q1800  . furosemide  80 mg Intravenous BID  . heparin  5,000 Units Subcutaneous Q8H  . isosorbide-hydrALAZINE  1 tablet Oral TID  . metoprolol tartrate  25 mg Oral BID  . potassium chloride SA  20 mEq Oral BID  . potassium chloride  40 mEq Oral Once  . sodium chloride flush  3 mL Intravenous Q12H   Continuous Infusions: . sodium chloride     PRN Meds: sodium chloride, acetaminophen, ondansetron (ZOFRAN) IV, sodium chloride flush   Vital Signs    Vitals:   06/07/17 0029 06/07/17 0628 06/07/17 0937 06/07/17 1130  BP: 120/62 (!) 125/59 111/74 91/71  Pulse: 66 77 72 73  Resp: 18 18  18   Temp: 98.2 F (36.8 C) 97.9 F (36.6 C)  97.6 F (36.4 C)  TempSrc: Oral Oral  Oral  SpO2: 100% 98%  97%  Weight:  168 lb 6.4 oz (76.4 kg)    Height:        Intake/Output Summary (Last 24 hours) at 06/07/2017 1149 Last data filed at 06/07/2017 1131 Gross per 24 hour  Intake 1140 ml  Output 3150 ml  Net -2010 ml   Filed Weights   06/05/17 2134 06/06/17 0455 06/07/17 0628  Weight: 171 lb 3.2 oz (77.7 kg) 170 lb 8 oz (77.3 kg) 168 lb 6.4 oz (76.4 kg)    Telemetry    NSR without significant ventricular ectopy - Personally Reviewed    Physical Exam   GEN: No acute distress.   Neck: No JVD Cardiac: RRR, no murmurs, rubs, or gallops.  Respiratory: Clear to auscultation bilaterally. GI: Soft, nontender, non-distended  MS: No edema; No deformity. Neuro:  Nonfocal  Psych: Normal affect   Labs    Chemistry Recent Labs  Lab 06/05/17 2228 06/06/17 0727 06/07/17 0521  NA 141 141 139  K 2.9* 4.7 3.3*  CL 101 106 101  CO2 25 23 27   GLUCOSE 127* 83 92  BUN 11 13 11   CREATININE 1.14* 1.12*  1.09*  CALCIUM 9.9 9.3 9.2  GFRNONAA 50* 51* 52*  GFRAA 58* 59* >60  ANIONGAP 15 12 11      Hematology Recent Labs  Lab 06/05/17 0913 06/05/17 2228  WBC 10.6* 11.8*  RBC 4.54 5.14*  HGB 11.1* 12.9  HCT 35.4* 40.1  MCV 78.0 78.0  MCH 24.4* 25.1*  MCHC 31.4 32.2  RDW 16.9* 17.3*  PLT 190 210    Cardiac EnzymesNo results for input(s): TROPONINI in the last 168 hours.  Recent Labs  Lab 06/05/17 0929  TROPIPOC 0.05     BNP Recent Labs  Lab 06/05/17 0913  BNP 2,704.4*     DDimer No results for input(s): DDIMER in the last 168 hours.   Radiology    Ct Angio Chest Pe W/cm &/or Wo Cm  Result Date: 06/05/2017 CLINICAL DATA:  Right-sided chest pain with shortness of breath beginning last night. Associated dry cough. EXAM: CT ANGIOGRAPHY CHEST WITH CONTRAST TECHNIQUE: Multidetector CT imaging of the chest was performed using the standard protocol during bolus administration of intravenous contrast. Multiplanar CT image reconstructions and MIPs were obtained to evaluate the vascular anatomy. CONTRAST:  135mL ISOVUE-370 IOPAMIDOL (ISOVUE-370) INJECTION 76% COMPARISON:  Current chest radiograph.  Chest CT, 11/12/2016. FINDINGS: Cardiovascular: Satisfactory opacification of the pulmonary arteries to the segmental level. No evidence of pulmonary embolism. Heart is mildly enlarged. There changes from previous cardiac surgery and mitral valve replacement. Three-vessel coronary artery calcifications are noted. No pericardial effusion. There is prominence of the descending thoracic aorta which is stable from prior exam. Aortic atherosclerotic calcifications are noted. Mediastinum/Nodes: No neck base, axillary, mediastinal or hilar masses or adenopathy. Trachea is normal in caliber and widely patent. Esophagus is unremarkable. Lungs/Pleura: Small right pleural effusion. Pulmonary anastomosis staples are noted in the right upper lobe associated with scarring, new since the prior CT, subsequent to  resection of the previously seen small right upper lobe nodule. There is peripheral interstitial thickening most evident in the right lung. There is mild dependent subsegmental atelectasis. There is subtle ground-glass type opacity in the right lower lobe which could be due to atelectasis and/or air trapping. Infection is possible. No other evidence of pneumonia. There are changes of mild centrilobular emphysema. No lung masses or suspicious nodules. Upper Abdomen: No acute abnormality. Musculoskeletal: No fracture or acute finding. No osteoblastic or osteolytic lesions. Review of the MIP images confirms the above findings. IMPRESSION: 1. Subtle ground-glass opacity in the right lower lobe could reflect pneumonia but is more likely due to atelectasis/air trapping. No other evidence of pneumonia. 2. Mild interstitial thickening is noted most evident in the right lung, which may reflect mild asymmetric interstitial pulmonary edema from congestive heart failure. 3. Small right pleural effusion. 4. Patient has had a wedge resection of the right upper lobe removing the previously seen pulmonary nodule. 5. Mild centrilobular emphysema. 6. Cardiomegaly and changes from mitral valve replacement. Heart is larger than it was on the prior CT. Aortic Atherosclerosis (ICD10-I70.0) and Emphysema (ICD10-J43.9). Electronically Signed   By: Lajean Manes M.D.   On: 06/05/2017 14:59    Cardiac Studies   Echo 01/13/2017 LV EF: 40%  Study Conclusions  - Left ventricle: The cavity size was normal. Wall thickness was   normal. Systolic function was mildly to moderately reduced. The   estimated ejection fraction was 40%. Diffuse hypokinesis. There   was a reduced contribution of atrial contraction to ventricular   filling, due to increased ventricular diastolic pressure or   atrial contractile dysfunction. Doppler parameters are consistent   with high ventricular filling pressure. - Aortic valve: There was mild  regurgitation. - Mitral valve: A bioprosthesis was present. with normal function.   Mean gradient (D): 2 mm Hg. Peak gradient (D): 10 mm Hg. - Left atrium: The atrium was moderately dilated. - Pulmonary arteries: PA peak pressure: 38 mm Hg (S).   Patient Profile     67 y.o. female hx of CAD (s/p LIMA-LAD in 11/2014), MVR (in 11/2014 with pericardial tissue valve), HTN, HLD, COPD, PVD (s/p L CEA and right external iliac stenting), and CHF presented with acute on chronic systolic HF  Assessment & Plan    1. Acute on Chronic systolic HF Clinically improved Wishes to go home Not previously on lasix at home --> will start lasix 40mg  daily,  KDur 40 meq daily Keep on current medicines otherwise, except change metoprolol to metoprolol succinate 25mg  daily  2. CAD No ischemic symptoms  3. HTN Stable No change required today  4. S/p MVR: 11/2014 with pericardial tissue valve: stable on last echo  5. HLD: LDL 176 on 11/18/2016, on lipitor 80mg , questionable compliance  6. PAD: Status post left CEA and right external iliac stenting.  7. Lung CA s/p resection 2018.    DC to home Close outpatient follow-up with Dr Martinique  Ogden Handlin MD, [facility] 06/07/2017 11:51 AM

## 2017-06-07 NOTE — Progress Notes (Signed)
Reviewed all discharge instructions with patient and they stated understanding.  No voiced complaints.  Patient confirmed she has clothing and glasses as personal belongings that she brought to hospital.   Discharged for home via family transport

## 2017-06-09 ENCOUNTER — Other Ambulatory Visit: Payer: Self-pay | Admitting: Family Medicine

## 2017-06-09 ENCOUNTER — Telehealth: Payer: Self-pay | Admitting: Cardiology

## 2017-06-09 DIAGNOSIS — G8918 Other acute postprocedural pain: Secondary | ICD-10-CM

## 2017-06-09 MED ORDER — TRAMADOL HCL 50 MG PO TABS: 50.0000 mg | ORAL_TABLET | Freq: Four times a day (QID) | ORAL | 0 refills | Status: DC | PRN

## 2017-06-09 NOTE — Telephone Encounter (Signed)
Patient contacted regarding discharge from [facility] on 06/07/17.    Patient understands to follow up with provider Dr. Martinique on 06/16/17 at 10:00AM at [facility].  Patient understands discharge instructions? yes  Patient understands medications and regiment? yes  Patient understands to bring all medications to this visit? yes

## 2017-06-09 NOTE — Telephone Encounter (Signed)
As she was just recently seen in the hospital I will refill her tramadol. That being said, this is the last refill I will give without seeing her in the office. Please let her know this is available up front under the folder marked "W".  Guadalupe Dawn MD PGY-1 Family Medicine Resident

## 2017-06-09 NOTE — Telephone Encounter (Signed)
New message     TCM Dr Martinique 06/16/17 10am

## 2017-06-09 NOTE — Telephone Encounter (Signed)
Was discharged from hospital on Saturday. She needs refill on her tramadol.  She would like to pick that up today. Please advise.  [facility]. Please let her know when it is ready for pickup

## 2017-06-13 ENCOUNTER — Other Ambulatory Visit: Payer: Self-pay | Admitting: Family Medicine

## 2017-06-13 DIAGNOSIS — G8918 Other acute postprocedural pain: Secondary | ICD-10-CM

## 2017-06-13 LAB — HEPATIC FUNCTION PANEL
ALK PHOS: 171 IU/L — AB (ref 39–117)
ALT: 21 IU/L (ref 0–32)
AST: 20 IU/L (ref 0–40)
Albumin: 4.7 g/dL (ref 3.6–4.8)
BILIRUBIN TOTAL: 1.1 mg/dL (ref 0.0–1.2)
BILIRUBIN, DIRECT: 0.48 mg/dL — AB (ref 0.00–0.40)
TOTAL PROTEIN: 7.4 g/dL (ref 6.0–8.5)

## 2017-06-13 LAB — BASIC METABOLIC PANEL
BUN/Creatinine Ratio: 11 — ABNORMAL LOW (ref 12–28)
BUN: 14 mg/dL (ref 8–27)
CO2: 23 mmol/L (ref 20–29)
Calcium: 10.1 mg/dL (ref 8.7–10.3)
Chloride: 97 mmol/L (ref 96–106)
Creatinine, Ser: 1.27 mg/dL — ABNORMAL HIGH (ref 0.57–1.00)
GFR calc Af Amer: 52 mL/min/{1.73_m2} — ABNORMAL LOW (ref >59)
GFR, EST NON AFRICAN AMERICAN: 45 mL/min/{1.73_m2} — AB (ref >59)
GLUCOSE: 91 mg/dL (ref 65–99)
POTASSIUM: 4 mmol/L (ref 3.5–5.2)
SODIUM: 139 mmol/L (ref 134–144)

## 2017-06-13 LAB — LIPID PANEL W/O CHOL/HDL RATIO
Cholesterol, Total: 156 mg/dL (ref 100–199)
HDL: 45 mg/dL (ref >39)
LDL Calculated: 91 mg/dL (ref 0–99)
TRIGLYCERIDES: 98 mg/dL (ref 0–149)
VLDL Cholesterol Cal: 20 mg/dL (ref 5–40)

## 2017-06-14 NOTE — Progress Notes (Signed)
Cardiology Office Note    Date:  06/16/2017   ID:  Leah [name], [place] [birth year], MRN [id number]  PCP:  Guadalupe Dawn, MD  Cardiologist: Dr. Martinique  Chief Complaint  Patient presents with  . Follow-up    2 months  . Headache  . Congestive Heart Failure    History of Present Illness:    Leah [name] is a 67 y.o. female with past medical history of CAD (s/p LIMA-LAD in 11/2014), MVR (in 11/2014 with a pericardial tissue valve), HTN, HLD, COPD, and PVD (s/p L CEA and right external iliac stenting), CHF who is seen today for follow up.   In August 2018 she had pre op evaluation with a Myoview study that showed no ischemic and reduced EF. She had a right upper lobe lung nodule which had increased in size. She underwent resection in September 2018 with pathology showing adenocarcinoma stage 1. Negative margins. Echo performed in October showed EF 40% with normally functioning Mitral valve prosthesis.   She was admitted in March 2019 with CHF exacerbation. Responded to IV diuresis. Continued on other medical therapy. CT of the chest showed no PE. Discharge weight 168 lbs.   On follow up today she states her breathing is much better. She never had any LE edema. No orthopnea or PND. Monitoring weight at home and it has been stable. Notes some increased dizziness.     Past Medical History:  Diagnosis Date  . Anemia   . Bronchogenic lung cancer, right ([place]) 11/29/2016  . CAD (coronary artery disease)    a. s/p LIMA-LAD in 11/2014  . Carotid artery occlusion   . Complication of anesthesia    slow to awaken x 1 maybe 2001  . COPD (chronic obstructive pulmonary disease) ([place])   . GERD (gastroesophageal reflux disease)    "sometimes" takes Copywriter, advertising or drinks gingerale   . Headache(784.0)   . History of kidney stones   . Hyperlipidemia   . Hypertension   . Leg pain   . Peripheral vascular disease ([place])   . Renal vascular disease 10/26/2014   bilateral stents placed     . Severe mitral regurgitation    a. s/p MVR in 11/2014 with a pericardial tissue valve  . Shortness of breath dyspnea   . Tobacco abuse     Past Surgical History:  Procedure Laterality Date  . ABDOMINAL HYSTERECTOMY    . ANGIOPLASTY / STENTING ILIAC  2010   right external iliac by Dr. Irish Lack  . CARDIAC CATHETERIZATION  11/13/11   Left Heart Cath. with Coronary Angiogram  . CARDIAC CATHETERIZATION N/A 08/24/2014   Procedure: Left Heart Cath and Coronary Angiography;  Surgeon: Wellington Hampshire, MD;  Location: [facility];  Service: Cardiovascular;  Laterality: N/A;  . CARDIAC CATHETERIZATION N/A 10/26/2014   Procedure: Right/Left Heart Cath and Coronary Angiography;  Surgeon: Lynore Coscia M Martinique, MD; oLAD 70%, mLAD 70% ISR, D2 30%, OFC 30%, RCA 20%, EF nl, low R heart pressures after diuresis, severe MR  . CAROTID ENDARTERECTOMY  11/21/2007   left  . COLONOSCOPY W/ POLYPECTOMY    . CORONARY ANGIOPLASTY WITH STENT PLACEMENT  6/09   LAD 2.5x12 [place]  . CORONARY ARTERY BYPASS GRAFT N/A 12/05/2014   Procedure: CORONARY ARTERY BYPASS GRAFTING (CABG);  Surgeon: [name], MD;  Location: [facility];  Service: Open Heart Surgery;  Laterality: N/A;  Times 1 using left internal mammary artery to LAD  . FEMORAL-POPLITEAL BYPASS GRAFT  10/12  left Dr. Kellie Simmering  . MITRAL VALVE REPAIR  2016  . MITRAL VALVE REPLACEMENT N/A 12/05/2014   Procedure: MITRAL VALVE (MV) REPLACEMENT;  Surgeon: [name], MD;  Location: [facility];  Service: Open Heart Surgery;  Laterality: N/A;  Closure left atrial appendage  . RENAL ARTERY STENT Bilateral   . TEE WITHOUT CARDIOVERSION N/A 10/24/2014   Procedure: TRANSESOPHAGEAL ECHOCARDIOGRAM (TEE);  Surgeon: Thayer Headings, MD;  Location: [facility];  Service: Cardiovascular;  Laterality: N/A;  . TEE WITHOUT CARDIOVERSION N/A 12/05/2014   Procedure: TRANSESOPHAGEAL ECHOCARDIOGRAM (TEE);  Surgeon: [name], MD;  Location: [facility];  Service: Open Heart Surgery;   Laterality: N/A;  . VIDEO ASSISTED THORACOSCOPY (VATS)/WEDGE RESECTION Right 11/29/2016   Procedure: VIDEO ASSISTED THORACOSCOPY (VATS)/ RUL WEDGE RESECTION OF LESION/ NODE SAMPLING;  Surgeon: [name], MD;  Location: [facility];  Service: Thoracic;  Laterality: Right;  Marland Kitchen VIDEO BRONCHOSCOPY N/A 11/29/2016   Procedure: VIDEO BRONCHOSCOPY;  Surgeon: [name], MD;  Location: [facility];  Service: Thoracic;  Laterality: N/A;    Current Medications: Outpatient Medications Prior to Visit  Medication Sig Dispense Refill  . acetaminophen (TYLENOL) 325 MG tablet Take 325-650 mg by mouth every 6 (six) hours as needed (for headaches).    Marland Kitchen albuterol (PROVENTIL HFA;VENTOLIN HFA) 108 (90 Base) MCG/ACT inhaler Inhale 2 puffs into the lungs every 6 (six) hours as needed for wheezing or shortness of breath. 1 Inhaler 0  . aspirin EC 81 MG tablet Take 1 tablet (81 mg total) by mouth daily. 90 tablet 1  . atorvastatin (LIPITOR) 80 MG tablet Take 1 tablet (80 mg total) by mouth daily. 30 tablet 11  . cyclobenzaprine (FLEXERIL) 10 MG tablet Take 1 TABLET BY MOUTH THREE TIMES DAILY AS NEEDED for muscle spasms 60 tablet 0  . isosorbide-hydrALAZINE (BIDIL) 20-37.5 MG tablet Take 1 tablet by mouth 3 (three) times daily. 90 tablet 11  . metoprolol succinate (TOPROL-XL) 25 MG 24 hr tablet Take 1 tablet (25 mg total) by mouth daily. 90 tablet 3  . nitroGLYCERIN (NITROSTAT) 0.4 MG SL tablet Place 1 tablet (0.4 mg total) under the tongue every 5 (five) minutes x 3 doses as needed for chest pain. 25 tablet 12  . traMADol (ULTRAM) 50 MG tablet Take 1 tablet (50 mg total) by mouth every 6 (six) hours as needed for severe pain. for pain 30 tablet 0  . traZODone (DESYREL) 100 MG tablet take 1 TABLET BY MOUTH at bedtime AS NEEDED for sleep (Patient taking differently: Take 100 mg by mouth at bedtime as needed for sleep) 30 tablet 2  . benzonatate (TESSALON) 100 MG capsule Take 1 capsule (100 mg total) by mouth 3 (three)  times daily as needed for cough. 21 capsule 0  . doxycycline (VIBRAMYCIN) 100 MG capsule Take 1 capsule (100 mg total) by mouth 2 (two) times daily. 20 capsule 0  . furosemide (LASIX) 40 MG tablet Take 1 tablet (40 mg total) by mouth daily. 30 tablet 6  . potassium chloride SA (K-DUR,KLOR-CON) 20 MEQ tablet Take 2 tablets (40 mEq total) by mouth daily. 60 tablet 1   No facility-administered medications prior to visit.      Allergies:   Lisinopril; Chantix [varenicline]; and Penicillins   Social History   Socioeconomic History  . Marital status: Divorced    Spouse name: Not on file  . Number of children: 4  . Years of education: Not on file  . Highest education level: Not on  file  Occupational History  . Occupation: retired  Scientific laboratory technician  . Financial resource strain: Not on file  . Food insecurity:    Worry: Not on file    Inability: Not on file  . Transportation needs:    Medical: Not on file    Non-medical: Not on file  Tobacco Use  . Smoking status: Former Smoker    Packs/day: 0.50    Years: 35.00    Pack years: 17.50    Types: Cigarettes  . Smokeless tobacco: Never Used  . Tobacco comment: has not started wellbutrin yet  Substance and Sexual Activity  . Alcohol use: No    Alcohol/week: 0.0 oz  . Drug use: No  . Sexual activity: Never  Lifestyle  . Physical activity:    Days per week: Not on file    Minutes per session: Not on file  . Stress: Not on file  Relationships  . Social connections:    Talks on phone: Not on file    Gets together: Not on file    Attends religious service: Not on file    Active member of club or organization: Not on file    Attends meetings of clubs or organizations: Not on file    Relationship status: Not on file  Other Topics Concern  . Not on file  Social History Narrative  . Not on file     Family History:  The patient's family history includes ALS in her brother; Cancer in her mother; Heart disease in her father; Hypertension  in her brother, father, and mother; Kidney disease in her father; Prostate cancer in her father; Stroke in her paternal aunt.   Review of Systems:   Please see the history of present illness.     All other systems reviewed and are otherwise negative except as noted above.   Physical Exam:    VS:  BP 90/74   Pulse (!) 52   Ht 6\' 1"  (1.854 m)   Wt 167 lb (75.8 kg)   BMI 22.03 kg/m    GENERAL:  Well appearing, thin BF in NAD HEENT:  PERRL, EOMI, sclera are clear. Oropharynx is clear. NECK:  No jugular venous distention, carotid upstroke brisk and symmetric, bilateral bruits, no thyromegaly or adenopathy LUNGS:  Clear to auscultation bilaterally CHEST:  Unremarkable HEART:  RRR,  PMI not displaced or sustained,S1 and S2 within normal limits, no S3, no S4: no clicks, no rubs, no murmurs ABD:  Soft, nontender. BS +, no masses or bruits. No hepatomegaly, no splenomegaly EXT:  Reduced left pedal pulse, no edema, no cyanosis no clubbing SKIN:  Warm and dry.  No rashes NEURO:  Alert and oriented x 3. Cranial nerves II through XII intact. PSYCH:  Cognitively intact    Wt Readings from Last 3 Encounters:  06/16/17 167 lb (75.8 kg)  06/07/17 168 lb 6.4 oz (76.4 kg)  04/14/17 180 lb 4 oz (81.8 kg)     Studies/Labs Reviewed:   EKG:  EKG is not ordered today.    Recent Labs: 06/05/2017: B Natriuretic Peptide 2,704.4; Hemoglobin 12.9; Platelets 210 06/13/2017: ALT 21; BUN 14; Creatinine, Ser 1.27; Potassium 4.0; Sodium 139   Lipid Panel    Component Value Date/Time   CHOL 156 06/13/2017 1104   TRIG 98 06/13/2017 1104   HDL 45 06/13/2017 1104   CHOLHDL 4.8 (H) 11/18/2016 0844   CHOLHDL 5.6 (H) 01/15/2016 1117   VLDL 24 01/15/2016 1117   LDLCALC 91 06/13/2017 1104  LDLDIRECT 185 (A) 09/09/2011 0820    Additional studies/ records that were reviewed today include:   Myoview 11/20/16: Study Highlights    Nuclear stress EF: 31%. The left ventricular ejection fraction is  moderately decreased (30-44%).  Defect 1: There is a medium defect of moderate severity present in the apical anterior, apical septal, apical inferior and apex location.  Findings consistent with prior apical myocardial infarction.  This is a high risk study based on severely reduced LV function . There is no evidence of ischemia   Echo 01/13/17: Study Conclusions  - Left ventricle: The cavity size was normal. Wall thickness was   normal. Systolic function was mildly to moderately reduced. The   estimated ejection fraction was 40%. Diffuse hypokinesis. There   was a reduced contribution of atrial contraction to ventricular   filling, due to increased ventricular diastolic pressure or   atrial contractile dysfunction. Doppler parameters are consistent   with high ventricular filling pressure. - Aortic valve: There was mild regurgitation. - Mitral valve: A bioprosthesis was present. with normal function.   Mean gradient (D): 2 mm Hg. Peak gradient (D): 10 mm Hg. - Left atrium: The atrium was moderately dilated. - Pulmonary arteries: PA peak pressure: 38 mm Hg (S).   Assessment:    1. CAD S/P LAD DES 2009 with 70% ISR 08/24/14   2. S/P MVR (mitral valve replacement)   3. PAD (peripheral artery disease) ([place])   4. Chronic systolic CHF (congestive heart failure) ([place])      Plan:   In order of problems listed above:  1. CAD - s/p LIMA-LAD in 11/2014.  - she denies any recent chest pain on exertion. Has baseline dyspnea.  -  Lexiscan Myoview in August showed no ischemia but reduced EF   - continue ASA, statin, and BB therapy.   2. S/p MVR - performed in in 11/2014 with a pericardial tissue valve. Echo in August 2018 showed a stable prosthesis with no obvious MR.  - No significant murmur appreciated on examination today.   3. Chronic systolic CHF EF 75%. In review of records she had EF 45-50% post MVR. "normal" EF prior to surgery but I suspect there was latent LV dysfunction  in setting of severe MR. Follow up Echo in October 2018 showed EF 40%. Recent admission for CHF exacerbation. She has class 1-2 symptoms. She is not a candidate for ACEi or ARB due to history of angioedema on lisinopril. Continue Bidil 20/37.5 mg tid. and metoprolol. Will reduce lasix to 20 mg daily with increase in creatinine to 1.27 and low BP. Also reduce potassium to 20 meq daily. Will follow up in 3 months with BMET and BNP.   4. HTN - BP is low now. Reduce lasix as noted.  5. HLD - Lipid Panel shows LDL 91 since more compliant with lipitor. Encourage compliance.   6. PVD -  s/p L CEA and right external iliac stenting. Bilateral carotid bruits. Carotid dopplers in January showed moderate bilateral disease. Stable. Follow up in one year. LE dopplers showed patent stent. No change from 2016. No AAA.  - continue ASA and statin therapy.   7. Tobacco Use - still smoking 0.5 ppd. Encouraged complete smoking cessation  8. Adenocarcinoma lung. S/p resection. Stage 1.    Signed, Adilson Grafton Martinique, MD  06/16/2017 10:12 AM    [address], [place], [place]  [address] Phone: [phone]; Fax: [phone]  [address]  [address] [place], [place] [address] Phone: [phone]

## 2017-06-16 ENCOUNTER — Ambulatory Visit (INDEPENDENT_AMBULATORY_CARE_PROVIDER_SITE_OTHER): Payer: Medicaid Other | Admitting: Cardiology

## 2017-06-16 ENCOUNTER — Encounter: Payer: Self-pay | Admitting: Cardiology

## 2017-06-16 VITALS — BP 90/74 | HR 52 | Ht 73.0 in | Wt 167.0 lb

## 2017-06-16 DIAGNOSIS — Z9861 Coronary angioplasty status: Secondary | ICD-10-CM

## 2017-06-16 DIAGNOSIS — I5022 Chronic systolic (congestive) heart failure: Secondary | ICD-10-CM

## 2017-06-16 DIAGNOSIS — I739 Peripheral vascular disease, unspecified: Secondary | ICD-10-CM | POA: Diagnosis not present

## 2017-06-16 DIAGNOSIS — Z952 Presence of prosthetic heart valve: Secondary | ICD-10-CM | POA: Diagnosis not present

## 2017-06-16 DIAGNOSIS — I2510 Atherosclerotic heart disease of native coronary artery without angina pectoris: Secondary | ICD-10-CM

## 2017-06-16 MED ORDER — FUROSEMIDE 40 MG PO TABS: 20.0000 mg | ORAL_TABLET | Freq: Every day | ORAL | 6 refills | Status: DC

## 2017-06-16 MED ORDER — POTASSIUM CHLORIDE CRYS ER 20 MEQ PO TBCR: 20.0000 meq | EXTENDED_RELEASE_TABLET | Freq: Every day | ORAL | 1 refills | Status: DC

## 2017-06-16 NOTE — Patient Instructions (Signed)
Reduce lasix to 20 mg daily. You can take an extra dose if you are gaining weight or getting more short of breath.  Reduce potassium to 20 meq daily- just one tablet  I will see you in 3 months with blood work.

## 2017-06-17 ENCOUNTER — Other Ambulatory Visit: Payer: Self-pay | Admitting: Family Medicine

## 2017-06-17 DIAGNOSIS — G8918 Other acute postprocedural pain: Secondary | ICD-10-CM

## 2017-06-19 ENCOUNTER — Other Ambulatory Visit: Payer: Medicaid Other

## 2017-06-19 ENCOUNTER — Ambulatory Visit: Payer: Medicaid Other | Admitting: Cardiothoracic Surgery

## 2017-07-02 ENCOUNTER — Other Ambulatory Visit: Payer: Self-pay

## 2017-07-02 ENCOUNTER — Ambulatory Visit: Payer: Medicaid Other | Admitting: Family Medicine

## 2017-07-02 ENCOUNTER — Encounter: Payer: Self-pay | Admitting: Family Medicine

## 2017-07-02 VITALS — BP 100/60 | HR 56 | Temp 98.5°F | Ht 73.0 in | Wt 165.0 lb

## 2017-07-02 DIAGNOSIS — G8918 Other acute postprocedural pain: Secondary | ICD-10-CM

## 2017-07-02 DIAGNOSIS — M5432 Sciatica, left side: Secondary | ICD-10-CM

## 2017-07-02 DIAGNOSIS — I5043 Acute on chronic combined systolic (congestive) and diastolic (congestive) heart failure: Secondary | ICD-10-CM | POA: Diagnosis not present

## 2017-07-02 DIAGNOSIS — M5431 Sciatica, right side: Secondary | ICD-10-CM

## 2017-07-02 DIAGNOSIS — M545 Low back pain, unspecified: Secondary | ICD-10-CM

## 2017-07-02 DIAGNOSIS — G8929 Other chronic pain: Secondary | ICD-10-CM | POA: Diagnosis not present

## 2017-07-02 DIAGNOSIS — H6123 Impacted cerumen, bilateral: Secondary | ICD-10-CM | POA: Diagnosis not present

## 2017-07-02 MED ORDER — GABAPENTIN 100 MG PO CAPS: 100.0000 mg | ORAL_CAPSULE | Freq: Three times a day (TID) | ORAL | 0 refills | Status: DC

## 2017-07-02 MED ORDER — CYCLOBENZAPRINE HCL 10 MG PO TABS: 10.0000 mg | ORAL_TABLET | Freq: Three times a day (TID) | ORAL | 0 refills | Status: DC | PRN

## 2017-07-02 MED ORDER — TRAMADOL HCL 50 MG PO TABS: 50.0000 mg | ORAL_TABLET | Freq: Four times a day (QID) | ORAL | 0 refills | Status: DC | PRN

## 2017-07-02 NOTE — Patient Instructions (Signed)
It was great seeing you today! I am glad that things have been going well since you recent hospitalization. I am glad that your congestive heart failure is being cared for by cardiology. Regarding your back pain and burning I feel that it is likely due to sciatica. I will start you on a medication called gabapentin to help with that burning sensation. Regarding you earwax, I would recommend picking up an over the counter medication called debrox. This will help to soften up the wax. Please come back and see me in 2-3 weeks and we can take a look and likely get the rest of the wax out. I gave you refills for tramadol and flexeril.

## 2017-07-03 ENCOUNTER — Other Ambulatory Visit: Payer: Self-pay | Admitting: Family Medicine

## 2017-07-03 ENCOUNTER — Encounter: Payer: Self-pay | Admitting: Family Medicine

## 2017-07-03 DIAGNOSIS — M545 Low back pain: Principal | ICD-10-CM

## 2017-07-03 DIAGNOSIS — G8929 Other chronic pain: Secondary | ICD-10-CM

## 2017-07-03 NOTE — Assessment & Plan Note (Signed)
Well managed on tramadol and flexeril. Gave refill at this appointment.

## 2017-07-03 NOTE — Assessment & Plan Note (Signed)
Patient with positive straight leg test. Pain does not resolve with rest. Most likely cause of pain is sciatica. Other consideration are neurogenic claudication and spinal stenosis. Expect for these to resolve with rest and position change. Will start on very low dose of gabapentin. Will need to be careful as patient ckd stage 3a by last bmp on 3/22. Suspect mostly due to diuresis so will follow bmps. - gabapentin 100mg  tid prn

## 2017-07-03 NOTE — Assessment & Plan Note (Signed)
Patient doing very well on current regimen. Primarily managed by cardiology but no symptoms today, no indication for any changes. - continue lasix 20mg  daily - replete k with kdur 62meq - metoprolol 25mg  daily - bidil (20/37.5) 1 tablet 3 times per day

## 2017-07-03 NOTE — Progress Notes (Signed)
HPI 67 year old who presents to clinic for follow up and medication refills. Patient hospitalized to cardiology service in mid-march for chf exacerbation. She was discharged on 40mg  lasix daily and kdur 1meq daily. Seen in office on 3/25 and lasix reduced to 20mg  daily 2/2 cr increase. kdur switched to 31meq daily. Is also on bidil and metoprolol. Has been doing very well from this standpoint. Very little edema and no shortness of breath since discharge. Has follow up scheduled with cards in 3 months.  Patient also complaining of occasional leg weakness and "burning sensation" in BLE. She states that she will develop an intense burning sensation in one or both lower extremities. This pain will get so bad that she has to rest for a few minutes. Resting does not help it resolve but it "keeps her from falling". Her current pain medication regimen do not help the burning.  Patient also complaining of decreased hearing in both ears. States that she has tried using q-tips to get wax out but has been unable to get much out. Has not tried any otc methods.  CC: medication refills   ROS:  Review of Systems See HPI for ROS.   CC, SH/smoking status, and VS noted  Objective: BP 100/60   Pulse (!) 56   Temp 98.5 F (36.9 C) (Oral)   Ht 6\' 1"  (1.854 m)   Wt 74.8 kg (165 lb)   SpO2 98%   BMI 21.77 kg/m  Gen: NAD, alert, cooperative, and pleasant. AA female resting comfortably in bed. HEENT: NCAT, EOMI, PERRL. Complete occlusion of bilateral ear canal, unable to visual TM. CV: RRR, no murmur. Palpable peripheral pulses. Scant edema in BLE. Resp: CTAB, no wheezes, non-labored Abd: SNTND, BS present, no guarding or organomegaly Ext: No edema, warm Neuro: Alert and oriented, Speech clear, No gross deficits. Positive straight leg raise creating burning pain in BLE   Assessment and plan:  CHF (congestive heart failure) ([place]) Patient doing very well on current regimen. Primarily managed by  cardiology but no symptoms today, no indication for any changes. - continue lasix 20mg  daily - replete k with kdur 66meq - metoprolol 25mg  daily - bidil (20/37.5) 1 tablet 3 times per day  Bilateral sciatica Patient with positive straight leg test. Pain does not resolve with rest. Most likely cause of pain is sciatica. Other consideration are neurogenic claudication and spinal stenosis. Expect for these to resolve with rest and position change. Will start on very low dose of gabapentin. Will need to be careful as patient ckd stage 3a by last bmp on 3/22. Suspect mostly due to diuresis so will follow bmps. - gabapentin 100mg  tid prn  Bilateral hearing loss due to cerumen impaction Patient with earwax likely too impacted to remove. Will ask patient to use debrox ears drops for next 2 weeks to help soften and plan to remove at follow up. - debrox ear drops to soften wax - f/u in 2-3 weeks for cleanout  Low back pain Well managed on tramadol and flexeril. Gave refill at this appointment.   No orders of the defined types were placed in this encounter.   Meds ordered this encounter  Medications  . traMADol (ULTRAM) 50 MG tablet    Sig: Take 1 tablet (50 mg total) by mouth every 6 (six) hours as needed. for pain    Dispense:  60 tablet    Refill:  0  . cyclobenzaprine (FLEXERIL) 10 MG tablet    Sig: Take 1 tablet (  10 mg total) by mouth 3 (three) times daily as needed. for muscle spams    Dispense:  60 tablet    Refill:  0    This prescription was filled on 01/06/2017. Any refills authorized will be placed on file.  . gabapentin (NEURONTIN) 100 MG capsule    Sig: Take 1 capsule (100 mg total) by mouth 3 (three) times daily.    Dispense:  90 capsule    Refill:  0     Guadalupe Dawn MD PGY-1 Family Medicine Resident  07/03/2017 9:51 AM

## 2017-07-03 NOTE — Assessment & Plan Note (Signed)
Patient with earwax likely too impacted to remove. Will ask patient to use debrox ears drops for next 2 weeks to help soften and plan to remove at follow up. - debrox ear drops to soften wax - f/u in 2-3 weeks for cleanout

## 2017-07-28 ENCOUNTER — Ambulatory Visit: Payer: Medicaid Other | Admitting: Family Medicine

## 2017-07-28 ENCOUNTER — Other Ambulatory Visit: Payer: Self-pay

## 2017-07-28 ENCOUNTER — Encounter: Payer: Self-pay | Admitting: Family Medicine

## 2017-07-28 VITALS — BP 110/60 | HR 94 | Temp 97.7°F | Wt 167.2 lb

## 2017-07-28 DIAGNOSIS — J302 Other seasonal allergic rhinitis: Secondary | ICD-10-CM

## 2017-07-28 DIAGNOSIS — H6123 Impacted cerumen, bilateral: Secondary | ICD-10-CM | POA: Diagnosis present

## 2017-07-28 DIAGNOSIS — M545 Low back pain, unspecified: Secondary | ICD-10-CM

## 2017-07-28 DIAGNOSIS — G8918 Other acute postprocedural pain: Secondary | ICD-10-CM | POA: Diagnosis not present

## 2017-07-28 DIAGNOSIS — G8929 Other chronic pain: Secondary | ICD-10-CM | POA: Diagnosis not present

## 2017-07-28 MED ORDER — CETIRIZINE HCL 10 MG PO CHEW: 10.0000 mg | CHEWABLE_TABLET | Freq: Every day | ORAL | 0 refills | Status: DC

## 2017-07-28 MED ORDER — CYCLOBENZAPRINE HCL 10 MG PO TABS: 10.0000 mg | ORAL_TABLET | Freq: Three times a day (TID) | ORAL | 2 refills | Status: DC | PRN

## 2017-07-28 NOTE — Patient Instructions (Addendum)
It was great seeing you again! I think that your nasal and sinus pain is likely due to bad seasonal allergies. For this I will prescribe you  Zyrtec, 10mg  daily. We also cleaned out your earwax today. I was able to get almost all of the wax out of your right ear. I am not sure if I was able to get most of the wax out of your left ear. I would pick up debrox drops and use these routinely to get the rest of the wax out, as well as prevent future build up. I refilled your flexeril today.

## 2017-07-28 NOTE — Telephone Encounter (Signed)
Cetirizine chewable not covered by Medicaid. Please send regular tablet. Patient also requests a refill of Tramadol.  Call back is [phone].  [place] [address] [facility] RN)

## 2017-07-29 ENCOUNTER — Encounter: Payer: Self-pay | Admitting: Family Medicine

## 2017-07-29 MED ORDER — CETIRIZINE HCL 10 MG PO TABS: 10.0000 mg | ORAL_TABLET | Freq: Every day | ORAL | 1 refills | Status: DC

## 2017-07-29 MED ORDER — TRAMADOL HCL 50 MG PO TABS: 50.0000 mg | ORAL_TABLET | Freq: Four times a day (QID) | ORAL | 0 refills | Status: DC | PRN

## 2017-07-29 NOTE — Assessment & Plan Note (Signed)
Refilled tramadol prescrition

## 2017-07-29 NOTE — Telephone Encounter (Signed)
Please let the patient know that her tramadol and the new prescription for her zyrtec tablets have been sent in.  Guadalupe Dawn MD PGY-1 Family Medicine Resident

## 2017-07-29 NOTE — Progress Notes (Signed)
   HPI 67 year old who presents due to a scheduled follow up for bilateral cerumen impaction. She was told to use debrox drops over last 2 weeks and come back. Unfortunately patient did the drops one time and then stopped. Predictably her hearing difficulty has not resolved.  She also presents secondary to a pain in her right sinus. She states that the pain first started a few weeks ago, but that it has gotten worse in the meantime. He has also been having a sore throat, rhinorrhea, eye watering, and slight cough. She has no other complaints, no fevers, swelling, or vision problems.  CC: pain in head, earwax building u   ROS:  Review of Systems See HPI for ROS.   CC, SH/smoking status, and VS noted  Objective: BP 110/60 (BP Location: Left Arm)   Pulse 94   Temp 97.7 F (36.5 C) (Oral)   Wt 75.8 kg (167 lb 3.2 oz)   SpO2 98%   BMI 22.06 kg/m  Gen: NAD, alert, cooperative, and pleasant. AA female resting comfortably in bed. HEENT: complete occlusion of TM bilaterally, slight palpable pressure in right and left sinus, rhinorrhea noted, bilateral conjunctival injections, CV: RRR, no murmur. Palpable peripheral pulses. Scant edema in BLE. Resp: CTAB, no wheezes, non-labored Abd: SNTND, BS present, no guarding or organomegaly Ext: No edema, warm Neuro: Alert and oriented, Speech clear, No gross deficits.    Assessment and plan:  Bilateral hearing loss due to cerumen impaction Was able to get a very large amount of wax out of both ears. Right ear completely clear after irrigation. Left ear with still a small amount of wax. Instructed patient to continue to use debrox drops for the next few weeks to get the remainder of the wax out. Also instructed her to intermittently use the drops to stop future cerumen accumulation.  Low back pain Refilled tramadol prescrition  Seasonal allergies Symptoms consistent with seasonal allergies. Other items on differential are trigeminal neuralgia  given facial pain, and URI. These are both unlikely given history and symptoms. Gave script for zyrtec and goodrx coupon. Return precautions given.   No orders of the defined types were placed in this encounter.   Meds ordered this encounter  Medications  . DISCONTD: cetirizine (ZYRTEC) 10 MG chewable tablet    Sig: Chew 1 tablet (10 mg total) by mouth daily.    Dispense:  30 tablet    Refill:  0  . cyclobenzaprine (FLEXERIL) 10 MG tablet    Sig: Take 1 tablet (10 mg total) by mouth 3 (three) times daily as needed. for muscle spams    Dispense:  60 tablet    Refill:  2    This prescription was filled on 07/03/2017. Any refills authorized will be placed on file.  . traMADol (ULTRAM) 50 MG tablet    Sig: Take 1 tablet (50 mg total) by mouth every 6 (six) hours as needed. for pain    Dispense:  60 tablet    Refill:  0  . cetirizine (ZYRTEC) 10 MG tablet    Sig: Take 1 tablet (10 mg total) by mouth daily.    Dispense:  30 tablet    Refill:  1     Guadalupe Dawn MD PGY-1 Family Medicine Resident  07/29/2017 2:07 PM

## 2017-07-29 NOTE — Assessment & Plan Note (Signed)
Was able to get a very large amount of wax out of both ears. Right ear completely clear after irrigation. Left ear with still a small amount of wax. Instructed patient to continue to use debrox drops for the next few weeks to get the remainder of the wax out. Also instructed her to intermittently use the drops to stop future cerumen accumulation.

## 2017-07-29 NOTE — Assessment & Plan Note (Signed)
Symptoms consistent with seasonal allergies. Other items on differential are trigeminal neuralgia given facial pain, and URI. These are both unlikely given history and symptoms. Gave script for zyrtec and goodrx coupon. Return precautions given.

## 2017-07-30 NOTE — Telephone Encounter (Signed)
Called and informed patient of RX's being called in to her pharmacy.Ozella Almond, CMA

## 2017-08-07 ENCOUNTER — Ambulatory Visit (INDEPENDENT_AMBULATORY_CARE_PROVIDER_SITE_OTHER): Payer: Medicaid Other | Admitting: Cardiothoracic Surgery

## 2017-08-07 ENCOUNTER — Ambulatory Visit
Admission: RE | Admit: 2017-08-07 | Discharge: 2017-08-07 | Disposition: A | Discharge status: Home / Self Care | Payer: Medicaid Other | Source: Ambulatory Visit | Attending: Cardiothoracic Surgery | Admitting: Cardiothoracic Surgery

## 2017-08-07 VITALS — BP 118/66 | HR 88 | Resp 20 | Ht 73.0 in | Wt 166.0 lb

## 2017-08-07 DIAGNOSIS — C3490 Malignant neoplasm of unspecified part of unspecified bronchus or lung: Secondary | ICD-10-CM

## 2017-08-07 DIAGNOSIS — C3411 Malignant neoplasm of upper lobe, right bronchus or lung: Secondary | ICD-10-CM

## 2017-08-07 NOTE — Progress Notes (Signed)
[address]       [place] [address]             [phone]                  Cornelius F Hark Rogers Medical Record #[id number] Date of Birth: [birth year]  Referring EV:[id number], Edison Nasuti, MD Primary Cardiology:  Peter Martinique, MD Primary Care:Fletcher, Edison Nasuti, MD  Chief Complaint:  Follow Up Visit  Cancer Staging Bronchogenic lung cancer, right [facility]) Staging form: Lung, AJCC 8th Edition - Pathologic stage from 12/03/2016: Stage IA1 (pT1a, pN0, cM0) - Signed by [name], MD on 12/03/2016  11/29/2016 OPERATIVE REPORT PREOPERATIVE DIAGNOSIS:  Slowly enlarging right upper lobe lung nodule. POSTOPERATIVE DIAGNOSIS:  Slowly enlarging right upper lobe lung nodule, probably inflammatory nodule. PROCEDURES PERFORMED:  Bronchoscopy, right video-assisted thoracoscopy with wedge resection of right upper lobe lung lesion and lymph node sampling. SURGEON:  Lanelle Bal, MD  12/05/2014  OPERATIVE REPORT PREOPERATIVE DIAGNOSES: 1. Severe mitral insufficiency. 2. Aortic insufficiency. 3. Coronary occlusive disease. POSTOPERATIVE DIAGNOSES: 1. Severe mitral insufficiency. 2. Aortic insufficiency. 3. Coronary occlusive disease. 4. Aortic insufficiency, mild. PROCEDURE PERFORMED: Mitral valve replacement with [facility] pericardial tissue valve, model [id number] 29 mm, serial #[id number] and coronary artery bypass grafting x1 with the left internal mammary to the left anterior descending coronary artery. Closure of left atrial appendage. SURGEON: Lanelle Bal, MD.   History of Present Illness:     Patient returns to the office today in follow-up for her stage I a right upper lobe lung cancer resected 11/2016.  Recent she was admitted to the hospital for congestive heart failure was evaluated and treated by cardiology and discharged home.  At that time a CT scan was performed to rule out PE.  The patient had a repeat scan ordered 6  months ago done today.    Zubrod Score: At the time of surgery this patient's most appropriate activity status/level should be described as: []     0    Normal activity, no symptoms [x]     1    Restricted in physical strenuous activity but ambulatory, able to do out light work []     2    Ambulatory and capable of self care, unable to do work activities, up and about                 >50 % of waking hours                                                                                   []     3    Only limited self care, in bed greater than 50% of waking hours []     4    Completely disabled, no self care, confined to bed or chair []     5    Moribund  Social History   Tobacco Use  Smoking Status Current Every Day Smoker  . Packs/day: 0.25  . Years: 35.00  . Pack years: 8.75  . Types: Cigarettes  Smokeless Tobacco Never Used  Tobacco Comment   working on weaning herself       Allergies  Allergen Reactions  . Lisinopril Swelling    Angioedema 06/10/11  . Chantix [Varenicline] Other (See Comments)    Caused insomnia  . Penicillins Hives    Has patient had a PCN reaction causing immediate rash, facial/tongue/throat swelling, SOB or lightheadedness with hypotension: Yes Has patient had a PCN reaction causing severe rash involving mucus membranes or skin necrosis: No Has patient had a PCN reaction that required hospitalization: No Has patient had a PCN reaction occurring within the last 10 years: No If all of the above answers are "NO", then may proceed with Cephalosporin use.     Current Outpatient Medications  Medication Sig Dispense Refill  . albuterol (PROVENTIL HFA;VENTOLIN HFA) 108 (90 Base) MCG/ACT inhaler Inhale 2 puffs into the lungs every 6 (six) hours as needed for wheezing or shortness of breath. 1 Inhaler 0  . aspirin EC 81 MG tablet Take 1 tablet (81 mg total) by mouth daily. 90 tablet 1  . atorvastatin (LIPITOR) 80 MG tablet Take 1 tablet (80 mg total) by mouth daily.  30 tablet 11  . cetirizine (ZYRTEC) 10 MG tablet Take 1 tablet (10 mg total) by mouth daily. 30 tablet 1  . cyclobenzaprine (FLEXERIL) 10 MG tablet Take 1 tablet (10 mg total) by mouth 3 (three) times daily as needed. for muscle spams 60 tablet 2  . furosemide (LASIX) 40 MG tablet Take 0.5 tablets (20 mg total) by mouth daily. 30 tablet 6  . gabapentin (NEURONTIN) 100 MG capsule Take 1 capsule (100 mg total) by mouth 3 (three) times daily. 90 capsule 0  . isosorbide-hydrALAZINE (BIDIL) 20-37.5 MG tablet Take 1 tablet by mouth 3 (three) times daily. 90 tablet 11  . metoprolol succinate (TOPROL-XL) 25 MG 24 hr tablet Take 1 tablet (25 mg total) by mouth daily. 90 tablet 3  . nitroGLYCERIN (NITROSTAT) 0.4 MG SL tablet Place 1 tablet (0.4 mg total) under the tongue every 5 (five) minutes x 3 doses as needed for chest pain. 25 tablet 12  . potassium chloride SA (K-DUR,KLOR-CON) 20 MEQ tablet Take 1 tablet (20 mEq total) by mouth daily. 60 tablet 1  . traMADol (ULTRAM) 50 MG tablet Take 1 tablet (50 mg total) by mouth every 6 (six) hours as needed. for pain 60 tablet 0  . traZODone (DESYREL) 100 MG tablet take 1 TABLET BY MOUTH at bedtime AS NEEDED for sleep (Patient taking differently: Take 100 mg by mouth at bedtime as needed for sleep) 30 tablet 2   No current facility-administered medications for this visit.        Physical Exam: BP 118/66   Pulse 88   Resp 20   Ht 6\' 1"  (1.854 m)   Wt 166 lb (75.3 kg)   SpO2 98% Comment: RA  BMI 21.90 kg/m  General appearance: alert and cooperative Head: Normocephalic, without obvious abnormality, atraumatic Neck: no adenopathy, no carotid bruit, no JVD, supple, symmetrical, trachea midline and thyroid not enlarged, symmetric, no tenderness/mass/nodules Lymph nodes: Cervical, supraclavicular, and axillary nodes normal. Resp: clear to auscultation bilaterally Back: symmetric, no curvature. ROM normal. No CVA tenderness. Cardio: regular rate and rhythm,  S1, S2 normal, no murmur, click, rub or gallop GI: soft, non-tender; bowel sounds normal; no masses,  no organomegaly Extremities: extremities normal, atraumatic, no cyanosis or edema Neurologic: Grossly normal  Diagnostic Studies & Laboratory data:         Recent Radiology Findings: Ct Chest Wo Contrast  Result Date: 08/07/2017 CLINICAL DATA:  Follow-up lung cancer. Shortness of  breath. Right upper lobe wedge resection. EXAM: CT CHEST WITHOUT CONTRAST TECHNIQUE: Multidetector CT imaging of the chest was performed following the standard protocol without IV contrast. COMPARISON:  CT scans November 12, 2016 and June 05, 2017. FINDINGS: Cardiovascular: The heart is unchanged. Coronary artery calcifications are noted. The thoracic aorta is stable without significant aneurysm. Atherosclerotic changes noted. The central pulmonary artery is normal in caliber. Mediastinum/Nodes: The thyroid and esophagus are normal. No adenopathy in the chest. No effusions. Lungs/Pleura: Central airways are normal. No pneumothorax. Mild emphysematous changes in the upper lobes. A suture line in the right upper lobe is consistent with right upper lobe wedge resection, unchanged. No evidence recurrence at the resection site. No suspicious nodules or masses. No suspicious infiltrates. Upper Abdomen: Nonobstructive stone in the right kidney. No other acute abnormalities in the upper abdomen. Musculoskeletal: No chest wall mass or suspicious bone lesions identified. IMPRESSION: 1. Previous wedge resection in the right upper lobe with no evidence of recurrence. 2. Atherosclerotic changes in the visualized aorta. Coronary artery calcifications. 3. Emphysematous changes particularly in the upper lobes but also the lower lobes. 4. Nonobstructive right renal stone. Aortic Atherosclerosis (ICD10-I70.0) and Emphysema (ICD10-J43.9). Electronically Signed   By: Dorise Bullion III M.D   On: 08/07/2017 17:59    Mild dilation of  ascending aorta   < 4.0 cm   I have independently reviewed the above radiology findings and reviewed findings  with the patient.  Recent Labs: Lab Results  Component Value Date   WBC 11.8 (H) 06/05/2017   HGB 12.9 06/05/2017   HCT 40.1 06/05/2017   PLT 210 06/05/2017   GLUCOSE 91 06/13/2017   CHOL 156 06/13/2017   TRIG 98 06/13/2017   HDL 45 06/13/2017   LDLDIRECT 185 (A) 09/09/2011   LDLCALC 91 06/13/2017   ALT 21 06/13/2017   AST 20 06/13/2017   NA 139 06/13/2017   K 4.0 06/13/2017   CL 97 06/13/2017   CREATININE 1.27 (H) 06/13/2017   BUN 14 06/13/2017   CO2 23 06/13/2017   TSH 1.756 11/28/2014   INR 1.03 11/29/2016   HGBA1C 5.6 01/15/2016    Echo: 12/2016 Result status: Final result                           *[facility]*                        [address] [place], [place] [address]                            [phone]  ------------------------------------------------------------------- Transthoracic Echocardiography  Patient:    Leah [name], Leah [name] MR #:       [id number] Study Date: 01/13/2017 Gender:     F Age:        59 Height:     185.4 cm Weight:     80.3 kg BSA:        2.03 m^2 Pt. Status: Room:   SONOGRAPHER  Wyatt Mage, RDCS  PERFORMING   [facility], Outpatient  ATTENDING    Ahmed Prima Mancel Bale, [place], [place] M  cc:  ------------------------------------------------------------------- LV EF: 40%  ------------------------------------------------------------------- Indications:  Pre-op exam (Z01.81).  ------------------------------------------------------------------- History:   PMH:   Coronary artery disease.  Congestive heart failure.  Mitral valve disease.  Risk factors:  PVD. PVC. Former tobacco use. Hypertension.  ------------------------------------------------------------------- Study Conclusions  - Left ventricle: The cavity size was normal. Wall  thickness was   normal. Systolic function was mildly to moderately reduced. The   estimated ejection fraction was 40%. Diffuse hypokinesis. There   was a reduced contribution of atrial contraction to ventricular   filling, due to increased ventricular diastolic pressure or   atrial contractile dysfunction. Doppler parameters are consistent   with high ventricular filling pressure. - Aortic valve: There was mild regurgitation. - Mitral valve: A bioprosthesis was present. with normal function.   Mean gradient (D): 2 mm Hg. Peak gradient (D): 10 mm Hg. - Left atrium: The atrium was moderately dilated. - Pulmonary arteries: PA peak pressure: 38 mm Hg (S).  ------------------------------------------------------------------- Labs, prior tests, procedures, and surgery: Transthoracic echocardiography (03/10/2015).     EF was 45%.  Valve surgery.     Mitral valve replacement with a bioprosthetic valve.  ------------------------------------------------------------------- Study data:  Comparison was made to the study of 03/10/2015.  Study status:  Routine.  Procedure:  The patient reported no pain pre or post test. Transthoracic echocardiography. Image quality was adequate.  Study completion:  There were no complications. Transthoracic echocardiography.  M-mode, complete 2D, 3D, spectral Doppler, and color Doppler.  Birthdate:  Patient birthdate: 17-Jan-1951.  Age:  Patient is 67 yr old.  Sex:  Gender: female. BMI: 23.4 kg/m^2.  Blood pressure:     151/78  Patient status: Outpatient.  Study date:  Study date: 01/13/2017. Study time: 09:09 AM.  Location:  [facility]  -------------------------------------------------------------------  ------------------------------------------------------------------- Left ventricle:  The cavity size was normal. Wall thickness was normal. Systolic function was mildly to moderately reduced. The estimated ejection fraction was 40%. Diffuse  hypokinesis. There was a reduced contribution of atrial contraction to ventricular filling, due to increased ventricular diastolic pressure or atrial contractile dysfunction. Doppler parameters are consistent with high ventricular filling pressure.  ------------------------------------------------------------------- Aortic valve:   Trileaflet; normal thickness leaflets. Mobility was not restricted.  Doppler:  Transvalvular velocity was within the normal range. There was no stenosis. There was mild regurgitation.   ------------------------------------------------------------------- Aorta:  The aorta was normal, not dilated, and non-diseased. Aortic root: The aortic root was normal in size.  ------------------------------------------------------------------- Mitral valve:  A bioprosthesis was present. with normal function. Mobility was not restricted.  Doppler:  Transvalvular velocity was within the normal range. There was no evidence for stenosis. mean gradient 2 mm Hg. There was no regurgitation.    Valve area by pressure half-time: 2.37 cm^2. Indexed valve area by pressure half-time: 1.17 cm^2/m^2. Valve area by continuity equation (using LVOT flow): 1.97 cm^2. Indexed valve area by continuity equation (using LVOT flow): 0.97 cm^2/m^2.    Mean gradient (D): 2 mm Hg. Peak gradient (D): 10 mm Hg.  ------------------------------------------------------------------- Left atrium:  The atrium was moderately dilated.  ------------------------------------------------------------------- Atrial septum:  The septum was normal.  ------------------------------------------------------------------- Pulmonary veins: Common pulmonary vein:  The Doppler velocity and flow profile were normal.  ------------------------------------------------------------------- Right ventricle:  The cavity size was normal. Wall thickness was normal. Systolic function was  normal.  ------------------------------------------------------------------- Pulmonic valve:    Structurally normal valve.   Cusp separation was normal.  Doppler:  Transvalvular velocity was within the normal range. There was no evidence for stenosis. There was no regurgitation.  ------------------------------------------------------------------- Tricuspid  valve:   Structurally normal valve.   Leaflet separation was normal.  Doppler:  Transvalvular velocity was within the normal range. There was mild regurgitation.  ------------------------------------------------------------------- Pulmonary artery:   The main pulmonary artery was normal-sized. Systolic pressure was within the normal range.  ------------------------------------------------------------------- Right atrium:  The atrium was normal in size.  ------------------------------------------------------------------- Pericardium:  The pericardium was normal in appearance. There was no pericardial effusion.  ------------------------------------------------------------------- Systemic veins: Inferior vena cava: The vessel was normal in size. The respirophasic diameter changes were in the normal range (= 50%), consistent with normal central venous pressure.  ------------------------------------------------------------------- Post procedure conclusions Ascending Aorta:  - The aorta was normal, not dilated, and non-diseased.  ------------------------------------------------------------------- Measurements   Left ventricle                            Value          Reference  LV ID, ED, PLAX chordal                   49.6  mm       43 - 52  LV ID, ES, PLAX chordal           (H)     42    mm       23 - 38  LV fx shortening, PLAX chordal    (L)     15    %        >=29  LV PW thickness, ED                       11.1  mm       ---------  IVS/LV PW ratio, ED                       1.02           <=1.3  Stroke volume, 2D                          58    ml       ---------  Stroke volume/bsa, 2D                     29    ml/m^2   ---------  LV e&', lateral                            3.43  cm/s     ---------  LV E/e&', lateral                          47.23          ---------  LV e&', medial                             4.6   cm/s     ---------  LV E/e&', medial                           35.22          ---------  LV e&', average  4.02  cm/s     ---------  LV E/e&', average                          40.35          ---------    Ventricular septum                        Value          Reference  IVS thickness, ED                         11.3  mm       ---------    LVOT                                      Value          Reference  LVOT ID, S                                20    mm       ---------  LVOT area                                 3.14  cm^2     ---------  LVOT peak velocity, S                     96.4  cm/s     ---------  LVOT mean velocity, S                     57    cm/s     ---------  LVOT VTI, S                               18.6  cm       ---------    Aortic valve                              Value          Reference  Aortic regurg pressure half-time          504   ms       ---------    Aorta                                     Value          Reference  Aortic root ID, ED                        31    mm       ---------  Ascending aorta ID, A-P, S                30    mm       ---------    Left atrium  Value          Reference  LA ID, A-P, ES                            48    mm       ---------  LA ID/bsa, A-P                    (H)     2.36  cm/m^2   <=2.2  LA volume, S                              61    ml       ---------  LA volume/bsa, S                          30    ml/m^2   ---------  LA volume, ES, 1-p A4C                    54.6  ml       ---------  LA volume/bsa, ES, 1-p A4C                26.8  ml/m^2   ---------  LA volume, ES, 1-p  A2C                    66    ml       ---------  LA volume/bsa, ES, 1-p A2C                32.4  ml/m^2   ---------    Mitral valve                              Value          Reference  Mitral E-wave peak velocity               162   cm/s     ---------  Mitral A-wave peak velocity               60.4  cm/s     ---------  Mitral mean velocity, D                   54.8  cm/s     ---------  Mitral deceleration time          (H)     268   ms       150 - 230  Mitral pressure half-time                 93    ms       ---------  Mitral mean gradient, D                   2     mm Hg    ---------  Mitral peak gradient, D                   10    mm Hg    ---------  Mitral E/A ratio, peak                    2.7            ---------  Mitral valve area,  PHT, DP                2.37  cm^2     ---------  Mitral valve area/bsa, PHT, DP            1.17  cm^2/m^2 ---------  Mitral valve area, LVOT                   1.97  cm^2     ---------  continuity  Mitral valve area/bsa, LVOT               0.97  cm^2/m^2 ---------  continuity  Mitral annulus VTI, D                     29.6  cm       ---------    Pulmonary arteries                        Value          Reference  PA pressure, S, DP                (H)     38    mm Hg    <=30    Tricuspid valve                           Value          Reference  Tricuspid regurg peak velocity            276   cm/s     ---------  Tricuspid peak RV-RA gradient             30    mm Hg    ---------    Systemic veins                            Value          Reference  Estimated CVP                             8     mm Hg    ---------    Right ventricle                           Value          Reference  TAPSE                                     17    mm       ---------  RV pressure, S, DP                (H)     38    mm Hg    <=30  RV s&', lateral, S                         6.02  cm/s     ---------  Legend: (L)  and  (H)  mark values outside specified reference  range.  ------------------------------------------------------------------- Prepared and Electronically Authenticated by  Shirlee More, MD 2018-10-23T12:29:17    Assessment / Plan:   Stable ct in follow up of right upper lobe  wedge resection of stage I non small cell ca of the lung , no evidence of recurrance. Follow up ct of chest in 6 months Patient mvr replacement intact- dental prophylaxis again reviewed   I  spent 15 minutes with  the patient face to face and greater then 50% of the time was spent in counseling and coordination of care.   [name] 08/08/2017 7:01 AM

## 2017-08-07 NOTE — Patient Instructions (Signed)

## 2017-08-08 ENCOUNTER — Encounter: Payer: Self-pay | Admitting: Cardiothoracic Surgery

## 2017-08-22 ENCOUNTER — Other Ambulatory Visit: Payer: Self-pay | Admitting: Family Medicine

## 2017-08-22 DIAGNOSIS — G8918 Other acute postprocedural pain: Secondary | ICD-10-CM

## 2017-08-22 MED ORDER — TRAMADOL HCL 50 MG PO TABS: 50.0000 mg | ORAL_TABLET | Freq: Four times a day (QID) | ORAL | 0 refills | Status: DC | PRN

## 2017-08-22 MED ORDER — GABAPENTIN 100 MG PO CAPS: 100.0000 mg | ORAL_CAPSULE | Freq: Three times a day (TID) | ORAL | 0 refills | Status: DC

## 2017-08-22 MED ORDER — ISOSORB DINITRATE-HYDRALAZINE 20-37.5 MG PO TABS: 1.0000 | ORAL_TABLET | Freq: Three times a day (TID) | ORAL | 11 refills | Status: DC

## 2017-08-22 NOTE — Telephone Encounter (Signed)
Pt needs refills on Bidil, Gabapentin, and Tramadol sent to [facility] on [address]. Please advise

## 2017-08-28 ENCOUNTER — Telehealth: Payer: Self-pay | Admitting: *Deleted

## 2017-08-28 NOTE — Telephone Encounter (Signed)
Received call from [place] regarding critical potassium of 2.6 today. Spoke with patient and she is not taking her Potassium. Advised to take one now and will forward to Dr Martinique for review

## 2017-08-28 NOTE — Telephone Encounter (Signed)
Spoke to patient advised to take another Kdur 20 meq tonight.I will call back tomorrow after Dr.Jordan reviews lab.

## 2017-08-29 ENCOUNTER — Other Ambulatory Visit: Payer: Self-pay

## 2017-08-29 DIAGNOSIS — I5033 Acute on chronic diastolic (congestive) heart failure: Secondary | ICD-10-CM

## 2017-08-29 DIAGNOSIS — Z9861 Coronary angioplasty status: Secondary | ICD-10-CM

## 2017-08-29 DIAGNOSIS — I2510 Atherosclerotic heart disease of native coronary artery without angina pectoris: Secondary | ICD-10-CM

## 2017-08-29 LAB — BASIC METABOLIC PANEL
BUN/Creatinine Ratio: 10 — ABNORMAL LOW (ref 12–28)
BUN: 11 mg/dL (ref 8–27)
CALCIUM: 9.7 mg/dL (ref 8.7–10.3)
CO2: 26 mmol/L (ref 20–29)
CREATININE: 1.05 mg/dL — AB (ref 0.57–1.00)
Chloride: 94 mmol/L — ABNORMAL LOW (ref 96–106)
GFR calc Af Amer: 65 mL/min/{1.73_m2} (ref >59)
GFR calc non Af Amer: 56 mL/min/{1.73_m2} — ABNORMAL LOW (ref >59)
GLUCOSE: 101 mg/dL — AB (ref 65–99)
Potassium: 2.6 mmol/L — CL (ref 3.5–5.2)
Sodium: 139 mmol/L (ref 134–144)

## 2017-08-29 LAB — BRAIN NATRIURETIC PEPTIDE: BNP: 395.5 pg/mL — AB (ref 0.0–100.0)

## 2017-08-29 MED ORDER — POTASSIUM CHLORIDE CRYS ER 20 MEQ PO TBCR: EXTENDED_RELEASE_TABLET | ORAL | 6 refills | Status: DC

## 2017-08-29 NOTE — Progress Notes (Signed)
This encounter was created in error - please disregard.

## 2017-08-29 NOTE — Telephone Encounter (Signed)
Returned call to patient lab results given.Dr.Jordan advised to increase Kdur to 40 meq twice a day.Repeat bmet 09/02/17.

## 2017-09-02 NOTE — Progress Notes (Signed)
Cardiology Office Note    Date:  09/04/2017   ID:  Leah [name], DOB 1951/03/02, MRN [id number]  PCP:  Guadalupe Dawn, MD  Cardiologist: Dr. Martinique  Chief Complaint  Patient presents with  . Congestive Heart Failure  . Coronary Artery Disease    History of Present Illness:    Leah [name] is a 67 y.o. female with past medical history of CAD (s/p LIMA-LAD in 11/2014), MVR (in 11/2014 with a pericardial tissue valve), HTN, HLD, COPD, and PVD (s/p L CEA and right external iliac stenting), CHF who is seen today for follow up.   In August 2018 she had pre op evaluation with a Myoview study that showed no ischemic and reduced EF. She had a right upper lobe lung nodule which had increased in size. She underwent resection in September 2018 with pathology showing adenocarcinoma stage 1. Negative margins. Echo performed in October showed EF 40% with normally functioning Mitral valve prosthesis.   She was admitted in March 2019 with CHF exacerbation. Responded to IV diuresis. Continued on other medical therapy. CT of the chest showed no PE. Discharge weight 168 lbs.   On follow up today she reports a chest cold for the past 3 weeks. Seems to be better the last 2 days but states she still has a dry cough and it makes her ribs hurt. No fever. Taking antihistamine. No dyspnea, anginal pain. CT chest last month looked OK with some COPD. She is still smoking occasionally.    Past Medical History:  Diagnosis Date  . Anemia   . Bronchogenic lung cancer, right ([place]) 11/29/2016  . CAD (coronary artery disease)    a. s/p LIMA-LAD in 11/2014  . Carotid artery occlusion   . Complication of anesthesia    slow to awaken x 1 maybe 2001  . COPD (chronic obstructive pulmonary disease) ([place])   . GERD (gastroesophageal reflux disease)    "sometimes" takes Copywriter, advertising or drinks gingerale   . Headache(784.0)   . History of kidney stones   . Hyperlipidemia   . Hypertension   . Leg pain   .  Peripheral vascular disease ([place])   . Renal vascular disease 10/26/2014   bilateral stents placed   . Severe mitral regurgitation    a. s/p MVR in 11/2014 with a pericardial tissue valve  . Shortness of breath dyspnea   . Tobacco abuse     Past Surgical History:  Procedure Laterality Date  . ABDOMINAL HYSTERECTOMY    . ANGIOPLASTY / STENTING ILIAC  2010   right external iliac by Dr. Irish Lack  . CARDIAC CATHETERIZATION  11/13/11   Left Heart Cath. with Coronary Angiogram  . CARDIAC CATHETERIZATION N/A 08/24/2014   Procedure: Left Heart Cath and Coronary Angiography;  Surgeon: Wellington Hampshire, MD;  Location: [facility];  Service: Cardiovascular;  Laterality: N/A;  . CARDIAC CATHETERIZATION N/A 10/26/2014   Procedure: Right/Left Heart Cath and Coronary Angiography;  Surgeon: Rajiv Parlato M Martinique, MD; oLAD 70%, mLAD 70% ISR, D2 30%, OFC 30%, RCA 20%, EF nl, low R heart pressures after diuresis, severe MR  . CAROTID ENDARTERECTOMY  11/21/2007   left  . COLONOSCOPY W/ POLYPECTOMY    . CORONARY ANGIOPLASTY WITH STENT PLACEMENT  6/09   LAD 2.5x12 [place]  . CORONARY ARTERY BYPASS GRAFT N/A 12/05/2014   Procedure: CORONARY ARTERY BYPASS GRAFTING (CABG);  Surgeon: [name], MD;  Location: [facility];  Service: Open Heart Surgery;  Laterality: N/A;  Times  1 using left internal mammary artery to LAD  . FEMORAL-POPLITEAL BYPASS GRAFT  10/12   left Dr. Kellie Simmering  . MITRAL VALVE REPAIR  2016  . MITRAL VALVE REPLACEMENT N/A 12/05/2014   Procedure: MITRAL VALVE (MV) REPLACEMENT;  Surgeon: [name], MD;  Location: [facility];  Service: Open Heart Surgery;  Laterality: N/A;  Closure left atrial appendage  . RENAL ARTERY STENT Bilateral   . TEE WITHOUT CARDIOVERSION N/A 10/24/2014   Procedure: TRANSESOPHAGEAL ECHOCARDIOGRAM (TEE);  Surgeon: Thayer Headings, MD;  Location: [facility];  Service: Cardiovascular;  Laterality: N/A;  . TEE WITHOUT CARDIOVERSION N/A 12/05/2014   Procedure: TRANSESOPHAGEAL  ECHOCARDIOGRAM (TEE);  Surgeon: [name], MD;  Location: [facility];  Service: Open Heart Surgery;  Laterality: N/A;  . VIDEO ASSISTED THORACOSCOPY (VATS)/WEDGE RESECTION Right 11/29/2016   Procedure: VIDEO ASSISTED THORACOSCOPY (VATS)/ RUL WEDGE RESECTION OF LESION/ NODE SAMPLING;  Surgeon: [name], MD;  Location: [facility];  Service: Thoracic;  Laterality: Right;  Marland Kitchen VIDEO BRONCHOSCOPY N/A 11/29/2016   Procedure: VIDEO BRONCHOSCOPY;  Surgeon: [name], MD;  Location: [facility];  Service: Thoracic;  Laterality: N/A;    Current Medications: Outpatient Medications Prior to Visit  Medication Sig Dispense Refill  . albuterol (PROVENTIL HFA;VENTOLIN HFA) 108 (90 Base) MCG/ACT inhaler Inhale 2 puffs into the lungs every 6 (six) hours as needed for wheezing or shortness of breath. 1 Inhaler 0  . aspirin EC 81 MG tablet Take 1 tablet (81 mg total) by mouth daily. 90 tablet 1  . atorvastatin (LIPITOR) 80 MG tablet Take 1 tablet (80 mg total) by mouth daily. 30 tablet 11  . cetirizine (ZYRTEC) 10 MG tablet Take 1 tablet (10 mg total) by mouth daily. 30 tablet 1  . cyclobenzaprine (FLEXERIL) 10 MG tablet Take 1 tablet (10 mg total) by mouth 3 (three) times daily as needed. for muscle spams 60 tablet 2  . furosemide (LASIX) 40 MG tablet Take 0.5 tablets (20 mg total) by mouth daily. 30 tablet 6  . gabapentin (NEURONTIN) 100 MG capsule Take 1 capsule (100 mg total) by mouth 3 (three) times daily. 90 capsule 0  . isosorbide-hydrALAZINE (BIDIL) 20-37.5 MG tablet Take 1 tablet by mouth 3 (three) times daily. 90 tablet 11  . metoprolol succinate (TOPROL-XL) 25 MG 24 hr tablet Take 1 tablet (25 mg total) by mouth daily. 90 tablet 3  . nitroGLYCERIN (NITROSTAT) 0.4 MG SL tablet Place 1 tablet (0.4 mg total) under the tongue every 5 (five) minutes x 3 doses as needed for chest pain. 25 tablet 12  . potassium chloride SA (K-DUR,KLOR-CON) 20 MEQ tablet Take 2 tablets ( 40 meq ) twice a day 120 tablet 6    . traMADol (ULTRAM) 50 MG tablet Take 1 tablet (50 mg total) by mouth every 6 (six) hours as needed. for pain 60 tablet 0  . traZODone (DESYREL) 100 MG tablet take 1 TABLET BY MOUTH at bedtime AS NEEDED for sleep (Patient taking differently: Take 100 mg by mouth at bedtime as needed for sleep) 30 tablet 2   No facility-administered medications prior to visit.      Allergies:   Lisinopril; Chantix [varenicline]; and Penicillins   Social History   Socioeconomic History  . Marital status: Divorced    Spouse name: Not on file  . Number of children: 4  . Years of education: Not on file  . Highest education level: Not on file  Occupational History  . Occupation: retired  Social Needs  . Financial resource strain: Not on file  . Food insecurity:    Worry: Not on file    Inability: Not on file  . Transportation needs:    Medical: Not on file    Non-medical: Not on file  Tobacco Use  . Smoking status: Current Every Day Smoker    Packs/day: 0.25    Years: 35.00    Pack years: 8.75    Types: Cigarettes  . Smokeless tobacco: Never Used  . Tobacco comment: working on weaning herself  Substance and Sexual Activity  . Alcohol use: No    Alcohol/week: 0.0 oz  . Drug use: No  . Sexual activity: Never  Lifestyle  . Physical activity:    Days per week: Not on file    Minutes per session: Not on file  . Stress: Not on file  Relationships  . Social connections:    Talks on phone: Not on file    Gets together: Not on file    Attends religious service: Not on file    Active member of club or organization: Not on file    Attends meetings of clubs or organizations: Not on file    Relationship status: Not on file  Other Topics Concern  . Not on file  Social History Narrative  . Not on file     Family History:  The patient's family history includes ALS in her brother; Cancer in her mother; Heart disease in her father; Hypertension in her brother, father, and mother; Kidney disease in  her father; Prostate cancer in her father; Stroke in her paternal aunt.   Review of Systems:   Please see the history of present illness.     All other systems reviewed and are otherwise negative except as noted above.   Physical Exam:    VS:  BP 140/62   Pulse 84   Ht 6' 0.5" (1.842 m)   Wt 163 lb (73.9 kg)   BMI 21.80 kg/m    GENERAL:  Well appearing, thin BF in NAD HEENT:  PERRL, EOMI, sclera are clear. Oropharynx is clear. NECK:  No jugular venous distention, carotid upstroke brisk and symmetric, no bruits, no thyromegaly or adenopathy LUNGS:  Clear to auscultation bilaterally CHEST:  Unremarkable HEART:  RRR,  PMI not displaced or sustained,S1 and S2 within normal limits, no S3, no S4: no clicks, no rubs, no murmurs ABD:  Soft, nontender. BS +, no masses or bruits. No hepatomegaly, no splenomegaly EXT:  2 + pulses throughout, no edema, no cyanosis no clubbing SKIN:  Warm and dry.  No rashes NEURO:  Alert and oriented x 3. Cranial nerves II through XII intact. PSYCH:  Cognitively intact      Wt Readings from Last 3 Encounters:  09/04/17 163 lb (73.9 kg)  08/07/17 166 lb (75.3 kg)  07/28/17 167 lb 3.2 oz (75.8 kg)     Studies/Labs Reviewed:   EKG:  EKG is not ordered today.    Recent Labs: 06/05/2017: Hemoglobin 12.9; Platelets 210 06/13/2017: ALT 21 08/28/2017: BNP 395.5 09/03/2017: BUN 7; Creatinine, Ser 0.83; Potassium 3.5; Sodium 137   Lipid Panel    Component Value Date/Time   CHOL 156 06/13/2017 1104   TRIG 98 06/13/2017 1104   HDL 45 06/13/2017 1104   CHOLHDL 4.8 (H) 11/18/2016 0844   CHOLHDL 5.6 (H) 01/15/2016 1117   VLDL 24 01/15/2016 1117   LDLCALC 91 06/13/2017 1104   LDLDIRECT 185 (A) 09/09/2011 0820  Additional studies/ records that were reviewed today include:   Myoview 11/20/16: Study Highlights    Nuclear stress EF: 31%. The left ventricular ejection fraction is moderately decreased (30-44%).  Defect 1: There is a medium defect of  moderate severity present in the apical anterior, apical septal, apical inferior and apex location.  Findings consistent with prior apical myocardial infarction.  This is a high risk study based on severely reduced LV function . There is no evidence of ischemia   Echo 01/13/17: Study Conclusions  - Left ventricle: The cavity size was normal. Wall thickness was   normal. Systolic function was mildly to moderately reduced. The   estimated ejection fraction was 40%. Diffuse hypokinesis. There   was a reduced contribution of atrial contraction to ventricular   filling, due to increased ventricular diastolic pressure or   atrial contractile dysfunction. Doppler parameters are consistent   with high ventricular filling pressure. - Aortic valve: There was mild regurgitation. - Mitral valve: A bioprosthesis was present. with normal function.   Mean gradient (D): 2 mm Hg. Peak gradient (D): 10 mm Hg. - Left atrium: The atrium was moderately dilated. - Pulmonary arteries: PA peak pressure: 38 mm Hg (S).   Assessment:    1. Chronic systolic CHF (congestive heart failure) ([place])   2. CAD S/P LAD DES 2009 with 70% ISR 08/24/14   3. S/P MVR (mitral valve replacement)   4. PAD (peripheral artery disease) ([place])   5. Essential hypertension, benign      Plan:   In order of problems listed above:  1. CAD - s/p LIMA-LAD in 11/2014.  - no anginal symptoms. -  Lexiscan Myoview in August 2018 showed no ischemia but reduced EF   - continue ASA, statin, and BB therapy.   2. S/p MVR - performed in in 11/2014 with a pericardial tissue valve. Echo in August 2018 showed a stable prosthesis with no obvious MR.  - No significant murmur appreciated on examination today.   3. Chronic systolic CHF EF 53%. In review of records she had EF 45-50% post MVR. "normal" EF prior to surgery but I suspect there was latent LV dysfunction in setting of severe MR. Follow up Echo in October 2018 showed EF 40%.  She has  stable class 1-2 symptoms. She is not a candidate for ACEi or ARB due to history of angioedema on lisinopril. Continue Bidil 20/37.5 mg tid. and metoprolol. continue current lasix dose. Recent hypokalemia repleted. Will need to stay on higher potassium dose.    4. HTN - BP is well controlled.  5. HLD - Lipid Panel shows LDL 91 since more compliant with lipitor. Encourage compliance.   6. PVD -  s/p L CEA and right external iliac stenting. Bilateral carotid bruits. Carotid dopplers in January showed moderate bilateral disease. Stable. Follow up in one year. LE dopplers showed patent stent. No change from 2016. No AAA.  - continue ASA and statin therapy.   7. Tobacco Use - still smoking 0.5 ppd. Encouraged complete smoking cessation  8. Adenocarcinoma lung. S/p resection. Stage 1. No recurrence by CT.  9. Recent URI. Improving. Recommend cough suppressant and antihistamine.    Signed, Alfretta Pinch Martinique, MD  09/04/2017 8:51 AM    [address], [place], [place]  [address] Phone: [phone]; Fax: [phone]  [address], [place], [place] [address] Phone: [phone]

## 2017-09-03 LAB — BASIC METABOLIC PANEL
BUN / CREAT RATIO: 8 — AB (ref 12–28)
BUN: 7 mg/dL — ABNORMAL LOW (ref 8–27)
CO2: 24 mmol/L (ref 20–29)
CREATININE: 0.83 mg/dL (ref 0.57–1.00)
Calcium: 9.1 mg/dL (ref 8.7–10.3)
Chloride: 98 mmol/L (ref 96–106)
GFR, EST AFRICAN AMERICAN: 86 mL/min/{1.73_m2} (ref >59)
GFR, EST NON AFRICAN AMERICAN: 75 mL/min/{1.73_m2} (ref >59)
Glucose: 75 mg/dL (ref 65–99)
Potassium: 3.5 mmol/L (ref 3.5–5.2)
SODIUM: 137 mmol/L (ref 134–144)

## 2017-09-04 ENCOUNTER — Encounter: Payer: Self-pay | Admitting: Cardiology

## 2017-09-04 ENCOUNTER — Ambulatory Visit: Payer: Medicaid Other | Admitting: Cardiology

## 2017-09-04 VITALS — BP 140/62 | HR 84 | Ht 72.5 in | Wt 163.0 lb

## 2017-09-04 DIAGNOSIS — Z952 Presence of prosthetic heart valve: Secondary | ICD-10-CM

## 2017-09-04 DIAGNOSIS — I739 Peripheral vascular disease, unspecified: Secondary | ICD-10-CM

## 2017-09-04 DIAGNOSIS — I10 Essential (primary) hypertension: Secondary | ICD-10-CM | POA: Diagnosis not present

## 2017-09-04 DIAGNOSIS — I5022 Chronic systolic (congestive) heart failure: Secondary | ICD-10-CM | POA: Diagnosis not present

## 2017-09-04 DIAGNOSIS — Z9861 Coronary angioplasty status: Secondary | ICD-10-CM

## 2017-09-04 DIAGNOSIS — I2510 Atherosclerotic heart disease of native coronary artery without angina pectoris: Secondary | ICD-10-CM | POA: Diagnosis not present

## 2017-09-04 NOTE — Patient Instructions (Signed)
You can take Robitussin DM or Delsym as a cough suppressant.  Continue your current therapy

## 2017-09-22 ENCOUNTER — Other Ambulatory Visit: Payer: Self-pay | Admitting: *Deleted

## 2017-09-22 DIAGNOSIS — G8918 Other acute postprocedural pain: Secondary | ICD-10-CM

## 2017-09-23 MED ORDER — TRAMADOL HCL 50 MG PO TABS: 50.0000 mg | ORAL_TABLET | Freq: Four times a day (QID) | ORAL | 0 refills | Status: DC | PRN

## 2017-09-23 MED ORDER — TRAZODONE HCL 100 MG PO TABS: ORAL_TABLET | ORAL | 2 refills | Status: DC

## 2017-10-23 ENCOUNTER — Other Ambulatory Visit: Payer: Self-pay | Admitting: Family Medicine

## 2017-10-23 DIAGNOSIS — M545 Low back pain, unspecified: Secondary | ICD-10-CM

## 2017-10-23 DIAGNOSIS — G8918 Other acute postprocedural pain: Secondary | ICD-10-CM

## 2017-10-23 DIAGNOSIS — G8929 Other chronic pain: Secondary | ICD-10-CM

## 2017-10-29 ENCOUNTER — Encounter: Payer: Self-pay | Admitting: *Deleted

## 2017-10-29 NOTE — Telephone Encounter (Signed)
This encounter was created in error - please disregard.

## 2017-10-29 NOTE — Telephone Encounter (Signed)
Patient calling to check back on status of tramadol refill request from 10/23/17.  Will route request to Dr. Kris Mouton.  Burna Forts, BSN, RN-BC

## 2017-11-21 ENCOUNTER — Other Ambulatory Visit: Payer: Self-pay

## 2017-11-21 ENCOUNTER — Ambulatory Visit: Payer: Medicaid Other | Admitting: Family Medicine

## 2017-11-21 ENCOUNTER — Encounter: Payer: Self-pay | Admitting: Family Medicine

## 2017-11-21 VITALS — BP 122/70 | HR 68 | Temp 98.7°F | Ht 73.0 in | Wt 158.0 lb

## 2017-11-21 DIAGNOSIS — I739 Peripheral vascular disease, unspecified: Secondary | ICD-10-CM | POA: Diagnosis present

## 2017-11-21 DIAGNOSIS — Z23 Encounter for immunization: Secondary | ICD-10-CM | POA: Diagnosis not present

## 2017-11-21 DIAGNOSIS — M5431 Sciatica, right side: Secondary | ICD-10-CM

## 2017-11-21 DIAGNOSIS — M5432 Sciatica, left side: Secondary | ICD-10-CM | POA: Diagnosis not present

## 2017-11-21 MED ORDER — GABAPENTIN 100 MG PO CAPS: 200.0000 mg | ORAL_CAPSULE | Freq: Three times a day (TID) | ORAL | 3 refills | Status: DC

## 2017-11-21 MED ORDER — DICLOFENAC SODIUM 1 % TD GEL: 4.0000 g | Freq: Four times a day (QID) | TRANSDERMAL | 0 refills | Status: DC

## 2017-11-21 NOTE — Patient Instructions (Addendum)
It was great seeing you today.  Given your known history of arterial vascular disease and the worsening of your leg pain, would like to get an arterial Doppler to check your arteries in your legs.  You are having some symptoms consistent with sciatica on both sides, I would also like to get a lumbar spine x-rays.  I will call you with results.  For your acute pain I will increase her gabapentin dose to 200 mg 3 times per day.  Also given a prescription for Voltaren gel.  He can apply this 4-5 times per day to your affected areas.

## 2017-11-21 NOTE — Assessment & Plan Note (Addendum)
Patient symptoms very suggestive of claudication diagnosis.  Patient did have abnormal ABIs back in January, although on arterial Doppler this was felt to be stable since 2016 femoropopliteal bypass left lower extremity and right iliac stent.  Her symptoms acutely worsened over the last month I am concerned that she has developed more severe arterial disease.  Patient with tremendous set up for arterial disease given her long-standing smoking history.  Schedule bilateral lower extremity ABIs and arterial duplex ultrasound to further evaluate.  Patient almost due for her one-year check regardless -Arterial duplex ABIs, bilateral lower extremity

## 2017-11-21 NOTE — Progress Notes (Signed)
HPI 67 year old female with complex past medical history who presents for bilateral lower extremity pain.  Initially seen by me on 07/02/2017 for bilateral sciatica, she was started on gabapentin 100 mg 3 times daily for the pain.  But this was selected as she had a recent AKI due to diuresis.  She says that since that time both legs have continued to get worse especially over the last month.  She states that when she is at rest she has no pain when she starts walking she does have symptoms of increasing pain essentially diffuse bilateral lower extremities left greater than right.  She had a recent arterial duplex in January 2019.  Her left lower extremity looks stable compared to 2016 she does have a known left femoropopliteal bypass graft she is a right iliac stent, at that time yearly follow-up was recommended.  She states that nothing aside from rest helps her pain.  She has occasional burning her lower extremities will occasionally have a sharper pain.  She does not have any relief of this pain with back flexion.  Does not appear to hurt her knee worse with leg extension.  CC: Bilateral lower extremity pain   ROS:   Review of Systems See HPI for ROS.   CC, SH/smoking status, and VS noted  Objective: BP 122/70   Pulse 68   Temp 98.7 F (37.1 C) (Oral)   Ht 6\' 1"  (1.854 m)   Wt 158 lb (71.7 kg)   SpO2 98%   BMI 20.85 kg/m  Gen: NAD, alert, cooperative, and pleasant.  Well-appearing African-American female in no acute distress CV: RRR, no murmur.  2-second capillary refill bilateral lower extremities, faintly palpable PT DP bilateral lower extremities Resp: CTAB, no wheezes, non-labored Left foot: Faintly palpable PT DP left foot.  Left foot cool to touch, 2-second capillary refill R foot: Right foot cool to touch, faintly palpable PT DP easier to find the left lower extremity foot.  2-second cap refill.  Neuro: Alert and oriented, Speech clear, No gross deficits. Postivie straight  leg test bilateral lower extremity.  No pain with back flexion or extension.   Assessment and plan:  Bilateral sciatica Patient with positive straight leg test bilateral lower extremity, less painful than back in April per patient.  Sciatica due to nerve compression could play some aspect of her pain.  Im worried her symptoms are more consistent with claudication, see note below.  For neuropathic pain will increase her gabapentin to 200 mg 3 times daily, as her kidney function has improved.  Creatinine normal at last check back in June - gabapentin 200mg  tid  Claudication [facility]) Patient symptoms very suggestive of claudication diagnosis.  Patient did have abnormal ABIs back in January, although on arterial Doppler this was felt to be stable since 2016 femoropopliteal bypass left lower extremity and right iliac stent.  Her symptoms acutely worsened over the last month I am concerned that she has developed more severe arterial disease.  Patient with tremendous set up for arterial disease given her long-standing smoking history.  Schedule bilateral lower extremity ABIs and arterial duplex ultrasound to further evaluate.  Patient almost due for her one-year check regardless -Arterial duplex ABIs, bilateral lower extremity   Orders Placed This Encounter  Procedures  . DG Lumbar Spine Complete    Standing Status:   Future    Standing Expiration Date:   01/22/2019    Order Specific Question:   Reason for Exam (SYMPTOM  OR DIAGNOSIS REQUIRED)  Answer:   sciatica eval    Order Specific Question:   Preferred imaging location?    Answer:   [facility]    Order Specific Question:   Radiology Contrast Protocol - do NOT remove file path    Answer:   [email or web address]  . Flu Vaccine QUAD 36+ mos IM    Meds ordered this encounter  Medications  . gabapentin (NEURONTIN) 100 MG capsule    Sig: Take 2 capsules (200 mg total) by mouth 3 (three) times daily.     Dispense:  120 capsule    Refill:  3    Please consider 90 day supplies to promote better adherence  . diclofenac sodium (VOLTAREN) 1 % GEL    Sig: Apply 4 g topically 4 (four) times daily.    Dispense:  100 g    Refill:  0     Guadalupe Dawn MD PGY-2 Family Medicine Resident  11/21/2017 5:35 PM

## 2017-11-21 NOTE — Assessment & Plan Note (Addendum)
Patient with positive straight leg test bilateral lower extremity, less painful than back in April per patient.  Sciatica due to nerve compression could play some aspect of her pain.  Im worried her symptoms are more consistent with claudication, see note below.  For neuropathic pain will increase her gabapentin to 200 mg 3 times daily, as her kidney function has improved.  Creatinine normal at last check back in June - gabapentin 200mg  tid -Voltaren gel as needed

## 2017-11-24 ENCOUNTER — Other Ambulatory Visit: Payer: Self-pay | Admitting: Family Medicine

## 2017-11-24 DIAGNOSIS — G8918 Other acute postprocedural pain: Secondary | ICD-10-CM

## 2017-12-01 ENCOUNTER — Ambulatory Visit (HOSPITAL_BASED_OUTPATIENT_CLINIC_OR_DEPARTMENT_OTHER)
Admission: RE | Admit: 2017-12-01 | Discharge: 2017-12-01 | Disposition: A | Discharge status: Home / Self Care | Payer: Medicaid Other | Source: Ambulatory Visit | Attending: Family Medicine | Admitting: Family Medicine

## 2017-12-01 ENCOUNTER — Ambulatory Visit (HOSPITAL_COMMUNITY)
Admission: RE | Admit: 2017-12-01 | Discharge: 2017-12-01 | Disposition: A | Discharge status: Home / Self Care | Payer: Medicaid Other | Source: Ambulatory Visit | Attending: Family Medicine | Admitting: Family Medicine

## 2017-12-01 DIAGNOSIS — I70202 Unspecified atherosclerosis of native arteries of extremities, left leg: Secondary | ICD-10-CM | POA: Insufficient documentation

## 2017-12-01 DIAGNOSIS — I739 Peripheral vascular disease, unspecified: Secondary | ICD-10-CM | POA: Diagnosis present

## 2017-12-01 DIAGNOSIS — Z95828 Presence of other vascular implants and grafts: Secondary | ICD-10-CM | POA: Diagnosis not present

## 2017-12-01 NOTE — Progress Notes (Signed)
ABI completed   Right: Resting right ankle-brachial index indicates moderate right lower extremity arterial disease.   Left: Resting left ankle-brachial index indicates moderate left lower extremity arterial disease.    Left arterial duplex: 30-49% left ATA stenosis.   Landry Mellow, RDMS, RVT

## 2017-12-04 ENCOUNTER — Telehealth: Payer: Self-pay | Admitting: Family Medicine

## 2017-12-04 DIAGNOSIS — I70202 Unspecified atherosclerosis of native arteries of extremities, left leg: Secondary | ICD-10-CM

## 2017-12-04 NOTE — Telephone Encounter (Signed)
Family medicine telephone note  Call patient to discuss her recent left lower extremity arterial duplex, as well as ABIs.  Patient with ABIs of 0.6 bilateral lower extremities, which fits with her history of left lower extremity claudication with activity.  Inform patient that her left anterior tibial artery has a between 35% and 50% stenosis.  This perfectly corresponds to her claudication pain which typically starts in around the mid knee area and affects her anterior lower leg and foot.  For this problem the patient will need a vascular surgery referral, so this was placed.  She is previously seen Dr. Kellie Simmering for prior stents in the past, so we will route this referral to his office.  Also patient's against alarm symptoms including extreme pain, black foot, and inability to feel foot.  Answered all questions, patient in agreement with plan.  Guadalupe Dawn MD PGY-2 Family Medicine Resident

## 2017-12-23 ENCOUNTER — Other Ambulatory Visit: Payer: Self-pay | Admitting: Family Medicine

## 2017-12-23 DIAGNOSIS — G8918 Other acute postprocedural pain: Secondary | ICD-10-CM

## 2018-01-16 ENCOUNTER — Other Ambulatory Visit: Payer: Self-pay | Admitting: *Deleted

## 2018-01-16 MED ORDER — TRAZODONE HCL 100 MG PO TABS: ORAL_TABLET | ORAL | 2 refills | Status: DC

## 2018-01-26 ENCOUNTER — Other Ambulatory Visit: Payer: Self-pay

## 2018-01-26 ENCOUNTER — Other Ambulatory Visit: Payer: Self-pay | Admitting: *Deleted

## 2018-01-26 DIAGNOSIS — M545 Low back pain, unspecified: Secondary | ICD-10-CM

## 2018-01-26 DIAGNOSIS — G8918 Other acute postprocedural pain: Secondary | ICD-10-CM

## 2018-01-26 DIAGNOSIS — G8929 Other chronic pain: Secondary | ICD-10-CM

## 2018-01-26 MED ORDER — CYCLOBENZAPRINE HCL 10 MG PO TABS: 10.0000 mg | ORAL_TABLET | Freq: Three times a day (TID) | ORAL | 2 refills | Status: DC | PRN

## 2018-01-27 ENCOUNTER — Other Ambulatory Visit: Payer: Self-pay

## 2018-01-27 ENCOUNTER — Encounter: Payer: Self-pay | Admitting: Vascular Surgery

## 2018-01-27 ENCOUNTER — Ambulatory Visit (INDEPENDENT_AMBULATORY_CARE_PROVIDER_SITE_OTHER): Payer: Medicare Other | Admitting: Vascular Surgery

## 2018-01-27 ENCOUNTER — Encounter: Payer: Self-pay | Admitting: *Deleted

## 2018-01-27 VITALS — BP 177/78 | HR 64 | Resp 18 | Ht 73.0 in | Wt 156.0 lb

## 2018-01-27 DIAGNOSIS — I739 Peripheral vascular disease, unspecified: Secondary | ICD-10-CM

## 2018-01-27 MED ORDER — TRAMADOL HCL 50 MG PO TABS: 50.0000 mg | ORAL_TABLET | Freq: Four times a day (QID) | ORAL | 0 refills | Status: DC | PRN

## 2018-01-27 NOTE — Progress Notes (Signed)
Patient name: Leah [name] MRN: [id number] DOB: 07-Jan-1951 Sex: female  REASON FOR CONSULT: Left leg pain s/p remote L fem to BK pop bypass 2012 for claudication HPI: Leah [name] is a 67 y.o. female with multiple medical comorbidities including coronary artery disease status post CABG and mitral valve replacement, heart failure, COPD, tobacco abuse, hypertension, hyperlipidemia, bilateral renal stents for renal artery stenosis, and history of claudication with left common femoral to below-knee pop bypass in 2012 by Dr. Kellie Simmering that presents for evaluation of left leg subjective weakness and pain.  Patient states that this has been ongoing for months now where she feels her left leg is weak on the left side whenever she is up walking or moving around.  She does describe some intermittent charley horses in the back of her left calf as well as some pain in her left fifth toe.  No specific pain in the foot that keeps her awake at night and no new tissue loss.  Appears her bypass was done 2012 by Dr. Kellie Simmering for claudication and states the symptoms had resolved.  Her new symptoms may be similar to her prior symptoms before bypass.  She has been lost to follow-up and has not been seen since 2013.  Left lower extremity duplex was obtained recently that showed that the bypass graft was not visualized.  She had biphasic posterior tibial signal in the left foot with monophasic runoff in the dorsalis pedis.  Past Medical History:  Diagnosis Date  . Anemia   . Bronchogenic lung cancer, right ([place]) 11/29/2016  . CAD (coronary artery disease)    a. s/p LIMA-LAD in 11/2014  . Carotid artery occlusion   . CHF (congestive heart failure) ([place])   . Complication of anesthesia    slow to awaken x 1 maybe 2001  . COPD (chronic obstructive pulmonary disease) ([place])   . GERD (gastroesophageal reflux disease)    "sometimes" takes Copywriter, advertising or drinks gingerale   . Headache(784.0)   . History of kidney  stones   . Hyperlipidemia   . Hypertension   . Leg pain   . Peripheral vascular disease ([place])   . Renal vascular disease 10/26/2014   bilateral stents placed   . Severe mitral regurgitation    a. s/p MVR in 11/2014 with a pericardial tissue valve  . Shortness of breath dyspnea   . Tobacco abuse     Past Surgical History:  Procedure Laterality Date  . ABDOMINAL HYSTERECTOMY    . ANGIOPLASTY / STENTING ILIAC  2010   right external iliac by Dr. Irish Lack  . CARDIAC CATHETERIZATION  11/13/11   Left Heart Cath. with Coronary Angiogram  . CARDIAC CATHETERIZATION N/A 08/24/2014   Procedure: Left Heart Cath and Coronary Angiography;  Surgeon: Wellington Hampshire, MD;  Location: [facility];  Service: Cardiovascular;  Laterality: N/A;  . CARDIAC CATHETERIZATION N/A 10/26/2014   Procedure: Right/Left Heart Cath and Coronary Angiography;  Surgeon: Peter M Martinique, MD; oLAD 70%, mLAD 70% ISR, D2 30%, OFC 30%, RCA 20%, EF nl, low R heart pressures after diuresis, severe MR  . CAROTID ENDARTERECTOMY  11/21/2007   left  . COLONOSCOPY W/ POLYPECTOMY    . CORONARY ANGIOPLASTY WITH STENT PLACEMENT  6/09   LAD 2.5x12 Promus  . CORONARY ARTERY BYPASS GRAFT N/A 12/05/2014   Procedure: CORONARY ARTERY BYPASS GRAFTING (CABG);  Surgeon: [name], MD;  Location: [facility];  Service: Open Heart Surgery;  Laterality: N/A;  Times  1 using left internal mammary artery to LAD  . FEMORAL-POPLITEAL BYPASS GRAFT  10/12   left Dr. Kellie Simmering  . MITRAL VALVE REPAIR  2016  . MITRAL VALVE REPLACEMENT N/A 12/05/2014   Procedure: MITRAL VALVE (MV) REPLACEMENT;  Surgeon: [name], MD;  Location: [facility];  Service: Open Heart Surgery;  Laterality: N/A;  Closure left atrial appendage  . RENAL ARTERY STENT Bilateral   . TEE WITHOUT CARDIOVERSION N/A 10/24/2014   Procedure: TRANSESOPHAGEAL ECHOCARDIOGRAM (TEE);  Surgeon: Thayer Headings, MD;  Location: [facility];  Service: Cardiovascular;  Laterality: N/A;  . TEE WITHOUT  CARDIOVERSION N/A 12/05/2014   Procedure: TRANSESOPHAGEAL ECHOCARDIOGRAM (TEE);  Surgeon: [name], MD;  Location: [facility];  Service: Open Heart Surgery;  Laterality: N/A;  . VIDEO ASSISTED THORACOSCOPY (VATS)/WEDGE RESECTION Right 11/29/2016   Procedure: VIDEO ASSISTED THORACOSCOPY (VATS)/ RUL WEDGE RESECTION OF LESION/ NODE SAMPLING;  Surgeon: [name], MD;  Location: [facility];  Service: Thoracic;  Laterality: Right;  Marland Kitchen VIDEO BRONCHOSCOPY N/A 11/29/2016   Procedure: VIDEO BRONCHOSCOPY;  Surgeon: [name], MD;  Location: [facility];  Service: Thoracic;  Laterality: N/A;    Family History  Problem Relation Age of Onset  . Cancer Mother        BRAIN AND LUNG  . Hypertension Mother   . Hypertension Father   . Heart disease Father   . Prostate cancer Father   . Kidney disease Father   . Hypertension Brother   . ALS Brother   . Stroke Paternal Aunt        great aunt  . Heart attack Neg Hx   . Colon cancer Neg Hx   . Stomach cancer Neg Hx   . Rectal cancer Neg Hx   . Esophageal cancer Neg Hx   . Liver cancer Neg Hx     SOCIAL HISTORY: Social History   Socioeconomic History  . Marital status: Divorced    Spouse name: Not on file  . Number of children: 4  . Years of education: Not on file  . Highest education level: Not on file  Occupational History  . Occupation: retired  Scientific laboratory technician  . Financial resource strain: Not on file  . Food insecurity:    Worry: Not on file    Inability: Not on file  . Transportation needs:    Medical: Not on file    Non-medical: Not on file  Tobacco Use  . Smoking status: Current Every Day Smoker    Packs/day: 0.50    Years: 35.00    Pack years: 17.50    Types: Cigarettes  . Smokeless tobacco: Never Used  Substance and Sexual Activity  . Alcohol use: No    Alcohol/week: 0.0 standard drinks  . Drug use: No  . Sexual activity: Never  Lifestyle  . Physical activity:    Days per week: Not on file    Minutes per session:  Not on file  . Stress: Not on file  Relationships  . Social connections:    Talks on phone: Not on file    Gets together: Not on file    Attends religious service: Not on file    Active member of club or organization: Not on file    Attends meetings of clubs or organizations: Not on file    Relationship status: Not on file  . Intimate partner violence:    Fear of current or ex partner: Not on file    Emotionally abused: Not  on file    Physically abused: Not on file    Forced sexual activity: Not on file  Other Topics Concern  . Not on file  Social History Narrative  . Not on file    Allergies  Allergen Reactions  . Lisinopril Swelling    Angioedema 06/10/11  . Chantix [Varenicline] Other (See Comments)    Caused insomnia  . Penicillins Hives    Has patient had a PCN reaction causing immediate rash, facial/tongue/throat swelling, SOB or lightheadedness with hypotension: Yes Has patient had a PCN reaction causing severe rash involving mucus membranes or skin necrosis: No Has patient had a PCN reaction that required hospitalization: No Has patient had a PCN reaction occurring within the last 10 years: No If all of the above answers are "NO", then may proceed with Cephalosporin use.     Current Outpatient Medications  Medication Sig Dispense Refill  . albuterol (PROVENTIL HFA;VENTOLIN HFA) 108 (90 Base) MCG/ACT inhaler Inhale 2 puffs into the lungs every 6 (six) hours as needed for wheezing or shortness of breath. 1 Inhaler 0  . aspirin EC 81 MG tablet Take 1 tablet (81 mg total) by mouth daily. 90 tablet 1  . cyclobenzaprine (FLEXERIL) 10 MG tablet Take 1 tablet (10 mg total) by mouth 3 (three) times daily as needed. for muscle spams 60 tablet 2  . diclofenac sodium (VOLTAREN) 1 % GEL Apply 4 g topically 4 (four) times daily. 100 g 0  . furosemide (LASIX) 40 MG tablet Take 0.5 tablets (20 mg total) by mouth daily. 30 tablet 6  . gabapentin (NEURONTIN) 100 MG capsule Take 2  capsules (200 mg total) by mouth 3 (three) times daily. 120 capsule 3  . isosorbide-hydrALAZINE (BIDIL) 20-37.5 MG tablet Take 1 tablet by mouth 3 (three) times daily. 90 tablet 11  . metoprolol succinate (TOPROL-XL) 25 MG 24 hr tablet Take 1 tablet (25 mg total) by mouth daily. 90 tablet 3  . nitroGLYCERIN (NITROSTAT) 0.4 MG SL tablet Place 1 tablet (0.4 mg total) under the tongue every 5 (five) minutes x 3 doses as needed for chest pain. 25 tablet 12  . potassium chloride SA (K-DUR,KLOR-CON) 20 MEQ tablet Take 2 tablets ( 40 meq ) twice a day 120 tablet 6  . traMADol (ULTRAM) 50 MG tablet TAKE 1 TABLET BY MOUTH EVERY 6 HOURS AS NEEDED FOR PAIN 60 tablet 0  . traMADol (ULTRAM) 50 MG tablet Take 1 tablet (50 mg total) by mouth every 6 (six) hours as needed. for pain 60 tablet 0  . traZODone (DESYREL) 100 MG tablet Take 100 mg by mouth at bedtime as needed for sleep 30 tablet 2  . atorvastatin (LIPITOR) 80 MG tablet Take 1 tablet (80 mg total) by mouth daily. (Patient not taking: Reported on 01/27/2018) 30 tablet 11  . cetirizine (ZYRTEC) 10 MG tablet Take 1 tablet (10 mg total) by mouth daily. (Patient not taking: Reported on 01/27/2018) 30 tablet 1   No current facility-administered medications for this visit.     REVIEW OF SYSTEMS:  [X]  denotes positive finding, [ ]  denotes negative finding Cardiac  Comments:  Chest pain or chest pressure:    Shortness of breath upon exertion:    Short of breath when lying flat:    Irregular heart rhythm:        Vascular    Pain in calf, thigh, or hip brought on by ambulation: x   Pain in feet at night that wakes you up from your  sleep:     Blood clot in your veins:    Leg swelling:         Pulmonary    Oxygen at home:    Productive cough:     Wheezing:         Neurologic    Sudden weakness in arms or legs:     Sudden numbness in arms or legs:     Sudden onset of difficulty speaking or slurred speech:    Temporary loss of vision in one eye:       Problems with dizziness:         Gastrointestinal    Blood in stool:     Vomited blood:         Genitourinary    Burning when urinating:     Blood in urine:        Psychiatric    Major depression:         Hematologic    Bleeding problems:    Problems with blood clotting too easily:        Skin    Rashes or ulcers:        Constitutional    Fever or chills:      PHYSICAL EXAM: Vitals:   01/27/18 1039  BP: (!) 177/78  Pulse: 64  Resp: 18  SpO2: 98%  Weight: 70.8 kg  Height: 6\' 1"  (1.854 m)    GENERAL: The patient is a well-nourished female, in no acute distress. The vital signs are documented above. CARDIAC: There is a regular rate and rhythm.  VASCULAR:  2+ femoral pulse palpable bilateral groins 2+ palpable popliteal pulse bilaterally No palpable pedal pulses bilateral lower extremities No tissue loss Left foot plantar and dorsal flexion 5/5 strength and normal sensation PULMONARY: There is good air exchange bilaterally without wheezing or rales. ABDOMEN: Soft and non-tender with normal pitched bowel sounds.  MUSCULOSKELETAL: There are no major deformities or cyanosis. NEUROLOGIC: No focal weakness or paresthesias are detected. SKIN: There are no ulcers or rashes noted. PSYCHIATRIC: The patient has a normal affect.  DATA:   Reviewed her noninvasive imaging ABI 0.65 on the right 0.62 on the left.  Biphasic runoff on the left PT.  Bypass not visualized.  Assessment/Plan:  67 year old female with multiple medical comorbidities that previously underwent left common femoral to below-knee popliteal bypass in 2012 for claudication by Dr. Kellie Simmering..  She has been lost to follow-up and is now representing with unusual symptoms in her left leg that include subjective weakness and some intermittent pain in her calf and left fifth toe.  I do not think her symptoms are suggestive of critical limb ischemia however given that her bypass was not visualized on the duplex I am  willing to offer her an arteriogram to further evaluate her runoff in her left leg (affected extremity).  She does have a popliteal pulse appreciable on exam with a good femoral pulse.  Discussed we could evaluate if there is any endovascular options and confirm the state of her bypass - concern it is occluded.    Marty Heck, MD [facility] Office: [phone] Pager: [place]

## 2018-02-04 ENCOUNTER — Other Ambulatory Visit: Payer: Self-pay

## 2018-02-04 ENCOUNTER — Encounter (HOSPITAL_COMMUNITY)
Admission: RE | Disposition: A | Discharge status: Home / Self Care | Payer: Self-pay | Source: Ambulatory Visit | Attending: Vascular Surgery

## 2018-02-04 ENCOUNTER — Ambulatory Visit (HOSPITAL_COMMUNITY)
Admission: RE | Admit: 2018-02-04 | Discharge: 2018-02-04 | Disposition: A | Discharge status: Home / Self Care | Payer: Medicare Other | Source: Ambulatory Visit | Attending: Vascular Surgery | Admitting: Vascular Surgery

## 2018-02-04 DIAGNOSIS — Z955 Presence of coronary angioplasty implant and graft: Secondary | ICD-10-CM | POA: Insufficient documentation

## 2018-02-04 DIAGNOSIS — I110 Hypertensive heart disease with heart failure: Secondary | ICD-10-CM | POA: Insufficient documentation

## 2018-02-04 DIAGNOSIS — F17210 Nicotine dependence, cigarettes, uncomplicated: Secondary | ICD-10-CM | POA: Diagnosis not present

## 2018-02-04 DIAGNOSIS — Z7982 Long term (current) use of aspirin: Secondary | ICD-10-CM | POA: Diagnosis not present

## 2018-02-04 DIAGNOSIS — Z951 Presence of aortocoronary bypass graft: Secondary | ICD-10-CM | POA: Insufficient documentation

## 2018-02-04 DIAGNOSIS — Z888 Allergy status to other drugs, medicaments and biological substances status: Secondary | ICD-10-CM | POA: Insufficient documentation

## 2018-02-04 DIAGNOSIS — I6529 Occlusion and stenosis of unspecified carotid artery: Secondary | ICD-10-CM | POA: Diagnosis not present

## 2018-02-04 DIAGNOSIS — Z79899 Other long term (current) drug therapy: Secondary | ICD-10-CM | POA: Insufficient documentation

## 2018-02-04 DIAGNOSIS — I509 Heart failure, unspecified: Secondary | ICD-10-CM | POA: Diagnosis not present

## 2018-02-04 DIAGNOSIS — Z90710 Acquired absence of both cervix and uterus: Secondary | ICD-10-CM | POA: Diagnosis not present

## 2018-02-04 DIAGNOSIS — K219 Gastro-esophageal reflux disease without esophagitis: Secondary | ICD-10-CM | POA: Insufficient documentation

## 2018-02-04 DIAGNOSIS — Z9889 Other specified postprocedural states: Secondary | ICD-10-CM | POA: Diagnosis not present

## 2018-02-04 DIAGNOSIS — Z8249 Family history of ischemic heart disease and other diseases of the circulatory system: Secondary | ICD-10-CM | POA: Insufficient documentation

## 2018-02-04 DIAGNOSIS — Z880 Allergy status to penicillin: Secondary | ICD-10-CM | POA: Insufficient documentation

## 2018-02-04 DIAGNOSIS — Z95820 Peripheral vascular angioplasty status with implants and grafts: Secondary | ICD-10-CM | POA: Insufficient documentation

## 2018-02-04 DIAGNOSIS — I70212 Atherosclerosis of native arteries of extremities with intermittent claudication, left leg: Secondary | ICD-10-CM | POA: Insufficient documentation

## 2018-02-04 DIAGNOSIS — J449 Chronic obstructive pulmonary disease, unspecified: Secondary | ICD-10-CM | POA: Insufficient documentation

## 2018-02-04 DIAGNOSIS — Z952 Presence of prosthetic heart valve: Secondary | ICD-10-CM | POA: Diagnosis not present

## 2018-02-04 DIAGNOSIS — Z85118 Personal history of other malignant neoplasm of bronchus and lung: Secondary | ICD-10-CM | POA: Diagnosis not present

## 2018-02-04 DIAGNOSIS — Z87442 Personal history of urinary calculi: Secondary | ICD-10-CM | POA: Diagnosis not present

## 2018-02-04 DIAGNOSIS — E785 Hyperlipidemia, unspecified: Secondary | ICD-10-CM | POA: Insufficient documentation

## 2018-02-04 HISTORY — PX: PERIPHERAL VASCULAR INTERVENTION: CATH118257

## 2018-02-04 HISTORY — PX: LOWER EXTREMITY ANGIOGRAPHY: CATH118251

## 2018-02-04 LAB — BASIC METABOLIC PANEL
ANION GAP: 13 (ref 5–15)
Anion gap: 11 (ref 5–15)
BUN: 11 mg/dL (ref 8–23)
BUN: 10 mg/dL (ref 8–23)
CHLORIDE: 100 mmol/L (ref 98–111)
CHLORIDE: 99 mmol/L (ref 98–111)
CO2: 30 mmol/L (ref 22–32)
CO2: 30 mmol/L (ref 22–32)
Calcium: 8.7 mg/dL — ABNORMAL LOW (ref 8.9–10.3)
Calcium: 8.6 mg/dL — ABNORMAL LOW (ref 8.9–10.3)
Creatinine, Ser: 1.2 mg/dL — ABNORMAL HIGH (ref 0.44–1.00)
Creatinine, Ser: 1.06 mg/dL — ABNORMAL HIGH (ref 0.44–1.00)
GFR calc non Af Amer: 46 mL/min — ABNORMAL LOW (ref >60)
GFR calc non Af Amer: 54 mL/min — ABNORMAL LOW (ref >60)
GFR, EST AFRICAN AMERICAN: 54 mL/min — AB (ref >60)
Glucose, Bld: 89 mg/dL (ref 70–99)
Glucose, Bld: 128 mg/dL — ABNORMAL HIGH (ref 70–99)
POTASSIUM: 2 mmol/L — AB (ref 3.5–5.1)
SODIUM: 141 mmol/L (ref 135–145)
SODIUM: 142 mmol/L (ref 135–145)

## 2018-02-04 LAB — POCT ACTIVATED CLOTTING TIME
ACTIVATED CLOTTING TIME: 235 s
Activated Clotting Time: 235 seconds

## 2018-02-04 SURGERY — LOWER EXTREMITY ANGIOGRAPHY
Anesthesia: LOCAL

## 2018-02-04 MED ORDER — POTASSIUM CHLORIDE 10 MEQ/100ML IV SOLN
10.0000 meq | INTRAVENOUS | Status: AC
  Administered 2018-02-04: 10 meq via INTRAVENOUS
  Filled 2018-02-04: qty 100

## 2018-02-04 MED ORDER — LIDOCAINE HCL (PF) 1 % IJ SOLN
INTRAMUSCULAR | Status: AC
  Filled 2018-02-04: qty 30

## 2018-02-04 MED ORDER — HYDRALAZINE HCL 20 MG/ML IJ SOLN
INTRAMUSCULAR | Status: AC
  Filled 2018-02-04: qty 1

## 2018-02-04 MED ORDER — HYDRALAZINE HCL 20 MG/ML IJ SOLN: 5.0000 mg | INTRAMUSCULAR | Status: DC | PRN

## 2018-02-04 MED ORDER — SODIUM CHLORIDE 0.9 % IV SOLN: 250.0000 mL | INTRAVENOUS | Status: DC | PRN

## 2018-02-04 MED ORDER — CLOPIDOGREL BISULFATE 75 MG PO TABS: 75.0000 mg | ORAL_TABLET | Freq: Every day | ORAL | 11 refills | Status: DC

## 2018-02-04 MED ORDER — IODIXANOL 320 MG/ML IV SOLN
INTRAVENOUS | Status: DC | PRN
  Administered 2018-02-04: 110 mL via INTRA_ARTERIAL

## 2018-02-04 MED ORDER — CLOPIDOGREL BISULFATE 75 MG PO TABS
ORAL_TABLET | ORAL | Status: DC | PRN
  Administered 2018-02-04: 150 mg via ORAL

## 2018-02-04 MED ORDER — LABETALOL HCL 5 MG/ML IV SOLN
INTRAVENOUS | Status: DC | PRN
  Administered 2018-02-04: 10 mg via INTRAVENOUS

## 2018-02-04 MED ORDER — HYDRALAZINE HCL 20 MG/ML IJ SOLN
INTRAMUSCULAR | Status: DC | PRN
  Administered 2018-02-04 (×2): 10 mg via INTRAVENOUS

## 2018-02-04 MED ORDER — LIDOCAINE HCL (PF) 1 % IJ SOLN
INTRAMUSCULAR | Status: DC | PRN
  Administered 2018-02-04: 20 mL

## 2018-02-04 MED ORDER — FENTANYL CITRATE (PF) 100 MCG/2ML IJ SOLN
INTRAMUSCULAR | Status: DC | PRN
  Administered 2018-02-04 (×3): 25 ug via INTRAVENOUS

## 2018-02-04 MED ORDER — LABETALOL HCL 5 MG/ML IV SOLN
INTRAVENOUS | Status: AC
  Filled 2018-02-04: qty 4

## 2018-02-04 MED ORDER — MIDAZOLAM HCL 2 MG/2ML IJ SOLN
INTRAMUSCULAR | Status: AC
  Filled 2018-02-04: qty 2

## 2018-02-04 MED ORDER — HEPARIN (PORCINE) IN NACL 1000-0.9 UT/500ML-% IV SOLN
INTRAVENOUS | Status: DC | PRN
  Administered 2018-02-04 (×2): 500 mL

## 2018-02-04 MED ORDER — POTASSIUM CHLORIDE 10 MEQ/100ML IV SOLN
INTRAVENOUS | Status: AC | PRN
  Administered 2018-02-04 (×3): 10 meq via INTRAVENOUS

## 2018-02-04 MED ORDER — SODIUM CHLORIDE 0.9 % IV SOLN
INTRAVENOUS | Status: DC
  Administered 2018-02-04: 09:00:00 via INTRAVENOUS

## 2018-02-04 MED ORDER — ONDANSETRON HCL 4 MG/2ML IJ SOLN: 4.0000 mg | Freq: Four times a day (QID) | INTRAMUSCULAR | Status: DC | PRN

## 2018-02-04 MED ORDER — CLOPIDOGREL BISULFATE 75 MG PO TABS
ORAL_TABLET | ORAL | Status: AC
  Filled 2018-02-04: qty 2

## 2018-02-04 MED ORDER — LABETALOL HCL 5 MG/ML IV SOLN: 10.0000 mg | INTRAVENOUS | Status: DC | PRN

## 2018-02-04 MED ORDER — SODIUM CHLORIDE 0.9 % WEIGHT BASED INFUSION: 1.0000 mL/kg/h | INTRAVENOUS | Status: DC

## 2018-02-04 MED ORDER — POTASSIUM CHLORIDE CRYS ER 20 MEQ PO TBCR
40.0000 meq | EXTENDED_RELEASE_TABLET | Freq: Once | ORAL | Status: AC
  Administered 2018-02-04: 40 meq via ORAL
  Filled 2018-02-04: qty 2

## 2018-02-04 MED ORDER — ACETAMINOPHEN 325 MG PO TABS: 650.0000 mg | ORAL_TABLET | ORAL | Status: DC | PRN

## 2018-02-04 MED ORDER — HEPARIN SODIUM (PORCINE) 1000 UNIT/ML IJ SOLN
INTRAMUSCULAR | Status: AC
  Filled 2018-02-04: qty 1

## 2018-02-04 MED ORDER — POTASSIUM CHLORIDE CRYS ER 20 MEQ PO TBCR
20.0000 meq | EXTENDED_RELEASE_TABLET | Freq: Once | ORAL | Status: AC
  Administered 2018-02-04: 20 meq via ORAL
  Filled 2018-02-04: qty 1

## 2018-02-04 MED ORDER — SODIUM CHLORIDE 0.9% FLUSH: 3.0000 mL | Freq: Two times a day (BID) | INTRAVENOUS | Status: DC

## 2018-02-04 MED ORDER — FENTANYL CITRATE (PF) 100 MCG/2ML IJ SOLN
INTRAMUSCULAR | Status: AC
  Filled 2018-02-04: qty 2

## 2018-02-04 MED ORDER — CLOPIDOGREL BISULFATE 75 MG PO TABS: 150.0000 mg | ORAL_TABLET | Freq: Once | ORAL | Status: DC

## 2018-02-04 MED ORDER — CLOPIDOGREL BISULFATE 75 MG PO TABS: 75.0000 mg | ORAL_TABLET | Freq: Every day | ORAL | Status: DC

## 2018-02-04 MED ORDER — MIDAZOLAM HCL 2 MG/2ML IJ SOLN
INTRAMUSCULAR | Status: DC | PRN
  Administered 2018-02-04: 1 mg via INTRAVENOUS

## 2018-02-04 MED ORDER — HEPARIN SODIUM (PORCINE) 1000 UNIT/ML IJ SOLN
INTRAMUSCULAR | Status: DC | PRN
  Administered 2018-02-04: 2000 [IU] via INTRAVENOUS
  Administered 2018-02-04: 7000 [IU] via INTRAVENOUS

## 2018-02-04 MED ORDER — HEPARIN (PORCINE) IN NACL 1000-0.9 UT/500ML-% IV SOLN
INTRAVENOUS | Status: AC
  Filled 2018-02-04: qty 1000

## 2018-02-04 MED ORDER — SODIUM CHLORIDE 0.9% FLUSH: 3.0000 mL | INTRAVENOUS | Status: DC | PRN

## 2018-02-04 MED ORDER — POTASSIUM CHLORIDE 10 MEQ/100ML IV SOLN
INTRAVENOUS | Status: AC
  Filled 2018-02-04: qty 200

## 2018-02-04 SURGICAL SUPPLY — 23 items
BALLN MUSTANG 5X100X135 (BALLOONS) ×3
BALLOON MUSTANG 5X100X135 (BALLOONS) ×2 IMPLANT
CATH OMNI FLUSH 5F 65CM (CATHETERS) ×3 IMPLANT
CLOSURE MYNX CONTROL 6F/7F (Vascular Products) ×3 IMPLANT
DEVICE TORQUE .025-.038 (MISCELLANEOUS) ×3 IMPLANT
GLIDEWIRE ADV .035X260CM (WIRE) ×3 IMPLANT
KIT ENCORE 26 ADVANTAGE (KITS) ×3 IMPLANT
KIT MICROPUNCTURE NIT STIFF (SHEATH) ×3 IMPLANT
KIT PV (KITS) ×3 IMPLANT
SHEATH FLEX ANSEL ANG 6F 45CM (SHEATH) ×3 IMPLANT
SHEATH PINNACLE 5F 10CM (SHEATH) ×3 IMPLANT
SHEATH PINNACLE 6F 10CM (SHEATH) ×3 IMPLANT
SHEATH PROBE COVER 6X72 (BAG) ×3 IMPLANT
STENT ABSOLUTE PRO 6X30X135 (Permanent Stent) ×3 IMPLANT
STENT INNOVA 6X120X130 (Permanent Stent) ×3 IMPLANT
STOPCOCK MORSE 400PSI 3WAY (MISCELLANEOUS) ×3 IMPLANT
SYR MEDRAD MARK 7 150ML (SYRINGE) ×3 IMPLANT
TAPE VIPERTRACK RADIOPAQ (MISCELLANEOUS) ×2 IMPLANT
TAPE VIPERTRACK RADIOPAQUE (MISCELLANEOUS) ×1
TRANSDUCER W/STOPCOCK (MISCELLANEOUS) ×3 IMPLANT
TRAY PV CATH (CUSTOM PROCEDURE TRAY) ×3 IMPLANT
TUBING CIL FLEX 10 FLL-RA (TUBING) ×3 IMPLANT
WIRE BENTSON .035X145CM (WIRE) ×3 IMPLANT

## 2018-02-04 NOTE — H&P (Signed)
History and Physical Interval Note:  02/04/2018 10:37 AM  Leah [name]  has presented today for surgery, with the diagnosis of claudication  The various methods of treatment have been discussed with the patient and family. After consideration of risks, benefits and other options for treatment, the patient has consented to  Procedure(s): LOWER EXTREMITY ANGIOGRAPHY (N/A) as a surgical intervention .  The patient's history has been reviewed, patient examined, no change in status, stable for surgery.  I have reviewed the patient's chart and labs.  Questions were answered to the patient's satisfaction.     Aortogram, LLE arteriogram.  Marty Heck  Patient name: Leah [name] MRN: [id number] DOB: 07/11/1950 Sex: female  REASON FOR CONSULT: Left leg pain s/p remote L fem to BK pop bypass 2012 for claudication  HPI:  Leah [name] is a 67 y.o. female with multiple medical comorbidities including coronary artery disease status post CABG and mitral valve replacement, heart failure, COPD, tobacco abuse, hypertension, hyperlipidemia, bilateral renal stents for renal artery stenosis, and history of claudication with left common femoral to below-knee pop bypass in 2012 by Dr. Kellie Simmering that presents for evaluation of left leg subjective weakness and pain. Patient states that this has been ongoing for months now where she feels her left leg is weak on the left side whenever she is up walking or moving around. She does describe some intermittent charley horses in the back of her left calf as well as some pain in her left fifth toe. No specific pain in the foot that keeps her awake at night and no new tissue loss. Appears her bypass was done 2012 by Dr. Kellie Simmering for claudication and states the symptoms had resolved. Her new symptoms may be similar to her prior symptoms before bypass. She has been lost to follow-up and has not been seen since 2013. Left lower extremity duplex was obtained recently  that showed that the bypass graft was not visualized. She had biphasic posterior tibial signal in the left foot with monophasic runoff in the dorsalis pedis.      Past Medical History:  Diagnosis Date  . Anemia   . Bronchogenic lung cancer, right ([place]) 11/29/2016  . CAD (coronary artery disease)    a. s/p LIMA-LAD in 11/2014  . Carotid artery occlusion   . CHF (congestive heart failure) ([place])   . Complication of anesthesia    slow to awaken x 1 maybe 2001  . COPD (chronic obstructive pulmonary disease) ([place])   . GERD (gastroesophageal reflux disease)    "sometimes" takes Copywriter, advertising or drinks gingerale   . Headache(784.0)   . History of kidney stones   . Hyperlipidemia   . Hypertension   . Leg pain   . Peripheral vascular disease ([place])   . Renal vascular disease 10/26/2014   bilateral stents placed   . Severe mitral regurgitation    a. s/p MVR in 11/2014 with a pericardial tissue valve  . Shortness of breath dyspnea   . Tobacco abuse         Past Surgical History:  Procedure Laterality Date  . ABDOMINAL HYSTERECTOMY    . ANGIOPLASTY / STENTING ILIAC  2010   right external iliac by Dr. Irish Lack  . CARDIAC CATHETERIZATION  11/13/11   Left Heart Cath. with Coronary Angiogram  . CARDIAC CATHETERIZATION N/A 08/24/2014   Procedure: Left Heart Cath and Coronary Angiography; Surgeon: Wellington Hampshire, MD; Location: [facility]; Service: Cardiovascular; Laterality: N/A;  . CARDIAC  CATHETERIZATION N/A 10/26/2014   Procedure: Right/Left Heart Cath and Coronary Angiography; Surgeon: Peter M Martinique, MD; oLAD 70%, mLAD 70% ISR, D2 30%, OFC 30%, RCA 20%, EF nl, low R heart pressures after diuresis, severe MR  . CAROTID ENDARTERECTOMY  11/21/2007   left  . COLONOSCOPY W/ POLYPECTOMY    . CORONARY ANGIOPLASTY WITH STENT PLACEMENT  6/09   LAD 2.5x12 Prom[place]  . CORONARY ARTERY BYPASS GRAFT N/A 12/05/2014   Procedure: CORONARY ARTERY BYPASS GRAFTING (CABG); Surgeon: [name], MD;  Location: [facility]; Service: Open Heart Surgery; Laterality: N/A; Times 1 using left internal mammary artery to LAD  . FEMORAL-POPLITEAL BYPASS GRAFT  10/12   left Dr. Kellie Simmering  . MITRAL VALVE REPAIR  2016  . MITRAL VALVE REPLACEMENT N/A 12/05/2014   Procedure: MITRAL VALVE (MV) REPLACEMENT; Surgeon: [name], MD; Location: [facility]; Service: Open Heart Surgery; Laterality: N/A; Closure left atrial appendage  . RENAL ARTERY STENT Bilateral   . TEE WITHOUT CARDIOVERSION N/A 10/24/2014   Procedure: TRANSESOPHAGEAL ECHOCARDIOGRAM (TEE); Surgeon: Thayer Headings, MD; Location: [facility]; Service: Cardiovascular; Laterality: N/A;  . TEE WITHOUT CARDIOVERSION N/A 12/05/2014   Procedure: TRANSESOPHAGEAL ECHOCARDIOGRAM (TEE); Surgeon: [name], MD; Location: [facility]; Service: Open Heart Surgery; Laterality: N/A;  . VIDEO ASSISTED THORACOSCOPY (VATS)/WEDGE RESECTION Right 11/29/2016   Procedure: VIDEO ASSISTED THORACOSCOPY (VATS)/ RUL WEDGE RESECTION OF LESION/ NODE SAMPLING; Surgeon: [name], MD; Location: [facility]; Service: Thoracic; Laterality: Right;  Marland Kitchen VIDEO BRONCHOSCOPY N/A 11/29/2016   Procedure: VIDEO BRONCHOSCOPY; Surgeon: [name], MD; Location: [facility]; Service: Thoracic; Laterality: N/A;        Family History  Problem Relation Age of Onset  . Cancer Mother    BRAIN AND LUNG  . Hypertension Mother   . Hypertension Father   . Heart disease Father   . Prostate cancer Father   . Kidney disease Father   . Hypertension Brother   . ALS Brother   . Stroke Paternal Aunt    great aunt  . Heart attack Neg Hx   . Colon cancer Neg Hx   . Stomach cancer Neg Hx   . Rectal cancer Neg Hx   . Esophageal cancer Neg Hx   . Liver cancer Neg Hx    SOCIAL HISTORY:  Social History        Socioeconomic History  . Marital status: Divorced    Spouse name: Not on file  . Number of children: 4  . Years of education: Not on file  . Highest education level: Not on file   Occupational History  . Occupation: retired  Scientific laboratory technician  . Financial resource strain: Not on file  . Food insecurity:    Worry: Not on file    Inability: Not on file  . Transportation needs:    Medical: Not on file    Non-medical: Not on file  Tobacco Use  . Smoking status: Current Every Day Smoker    Packs/day: 0.50    Years: 35.00    Pack years: 17.50    Types: Cigarettes  . Smokeless tobacco: Never Used  Substance and Sexual Activity  . Alcohol use: No    Alcohol/week: 0.0 standard drinks  . Drug use: No  . Sexual activity: Never  Lifestyle  . Physical activity:    Days per week: Not on file    Minutes per session: Not on file  . Stress: Not on file  Relationships  . Social connections:  Talks on phone: Not on file    Gets together: Not on file    Attends religious service: Not on file    Active member of club or organization: Not on file    Attends meetings of clubs or organizations: Not on file    Relationship status: Not on file  . Intimate partner violence:    Fear of current or ex partner: Not on file    Emotionally abused: Not on file    Physically abused: Not on file    Forced sexual activity: Not on file  Other Topics Concern  . Not on file  Social History Narrative  . Not on file        Allergies  Allergen Reactions  . Lisinopril Swelling    Angioedema 06/10/11  . Chantix [Varenicline] Other (See Comments)    Caused insomnia  . Penicillins Hives    Has patient had a PCN reaction causing immediate rash, facial/tongue/throat swelling, SOB or lightheadedness with hypotension: Yes  Has patient had a PCN reaction causing severe rash involving mucus membranes or skin necrosis: No  Has patient had a PCN reaction that required hospitalization: No  Has patient had a PCN reaction occurring within the last 10 years: No  If all of the above answers are "NO", then may proceed with Cephalosporin use.          Current Outpatient Medications  Medication  Sig Dispense Refill  . albuterol (PROVENTIL HFA;VENTOLIN HFA) 108 (90 Base) MCG/ACT inhaler Inhale 2 puffs into the lungs every 6 (six) hours as needed for wheezing or shortness of breath. 1 Inhaler 0  . aspirin EC 81 MG tablet Take 1 tablet (81 mg total) by mouth daily. 90 tablet 1  . cyclobenzaprine (FLEXERIL) 10 MG tablet Take 1 tablet (10 mg total) by mouth 3 (three) times daily as needed. for muscle spams 60 tablet 2  . diclofenac sodium (VOLTAREN) 1 % GEL Apply 4 g topically 4 (four) times daily. 100 g 0  . furosemide (LASIX) 40 MG tablet Take 0.5 tablets (20 mg total) by mouth daily. 30 tablet 6  . gabapentin (NEURONTIN) 100 MG capsule Take 2 capsules (200 mg total) by mouth 3 (three) times daily. 120 capsule 3  . isosorbide-hydrALAZINE (BIDIL) 20-37.5 MG tablet Take 1 tablet by mouth 3 (three) times daily. 90 tablet 11  . metoprolol succinate (TOPROL-XL) 25 MG 24 hr tablet Take 1 tablet (25 mg total) by mouth daily. 90 tablet 3  . nitroGLYCERIN (NITROSTAT) 0.4 MG SL tablet Place 1 tablet (0.4 mg total) under the tongue every 5 (five) minutes x 3 doses as needed for chest pain. 25 tablet 12  . potassium chloride SA (K-DUR,KLOR-CON) 20 MEQ tablet Take 2 tablets ( 40 meq ) twice a day 120 tablet 6  . traMADol (ULTRAM) 50 MG tablet TAKE 1 TABLET BY MOUTH EVERY 6 HOURS AS NEEDED FOR PAIN 60 tablet 0  . traMADol (ULTRAM) 50 MG tablet Take 1 tablet (50 mg total) by mouth every 6 (six) hours as needed. for pain 60 tablet 0  . traZODone (DESYREL) 100 MG tablet Take 100 mg by mouth at bedtime as needed for sleep 30 tablet 2  . atorvastatin (LIPITOR) 80 MG tablet Take 1 tablet (80 mg total) by mouth daily. (Patient not taking: Reported on 01/27/2018) 30 tablet 11  . cetirizine (ZYRTEC) 10 MG tablet Take 1 tablet (10 mg total) by mouth daily. (Patient not taking: Reported on 01/27/2018) 30 tablet 1  No current facility-administered medications for this visit.    REVIEW OF SYSTEMS:  [X]  denotes  positive finding, [ ]  denotes negative finding  Cardiac  Comments:  Chest pain or chest pressure:    Shortness of breath upon exertion:    Short of breath when lying flat:    Irregular heart rhythm:        Vascular    Pain in calf, thigh, or hip brought on by ambulation: x   Pain in feet at night that wakes you up from your sleep:     Blood clot in your veins:    Leg swelling:         Pulmonary    Oxygen at home:    Productive cough:     Wheezing:         Neurologic    Sudden weakness in arms or legs:     Sudden numbness in arms or legs:     Sudden onset of difficulty speaking or slurred speech:    Temporary loss of vision in one eye:     Problems with dizziness:         Gastrointestinal    Blood in stool:     Vomited blood:         Genitourinary    Burning when urinating:     Blood in urine:        Psychiatric    Major depression:         Hematologic    Bleeding problems:    Problems with blood clotting too easily:        Skin    Rashes or ulcers:        Constitutional    Fever or chills:    PHYSICAL EXAM:     Vitals:   01/27/18 1039  BP: (!) 177/78  Pulse: 64  Resp: 18  SpO2: 98%  Weight: 70.8 kg  Height: 6\' 1"  (1.854 m)   GENERAL: The patient is a well-nourished female, in no acute distress. The vital signs are documented above.  CARDIAC: There is a regular rate and rhythm.  VASCULAR:  2+ femoral pulse palpable bilateral groins  2+ palpable popliteal pulse bilaterally  No palpable pedal pulses bilateral lower extremities  No tissue loss  Left foot plantar and dorsal flexion 5/5 strength and normal sensation  PULMONARY: There is good air exchange bilaterally without wheezing or rales.  ABDOMEN: Soft and non-tender with normal pitched bowel sounds.  MUSCULOSKELETAL: There are no major deformities or cyanosis.  NEUROLOGIC: No focal weakness or paresthesias are detected.  SKIN: There are no ulcers or rashes noted.  PSYCHIATRIC: The patient has a  normal affect.  DATA:  Reviewed her noninvasive imaging ABI 0.65 on the right 0.62 on the left. Biphasic runoff on the left PT. Bypass not visualized.  Assessment/Plan:  67 year old female with multiple medical comorbidities that previously underwent left common femoral to below-knee popliteal bypass in 2012 for claudication by Dr. Kellie Simmering.. She has been lost to follow-up and is now representing with unusual symptoms in her left leg that include subjective weakness and some intermittent pain in her calf and left fifth toe. I do not think her symptoms are suggestive of critical limb ischemia however given that her bypass was not visualized on the duplex I am willing to offer her an arteriogram to further evaluate her runoff in her left leg (affected extremity). She does have a popliteal pulse appreciable on exam with a good femoral pulse. Discussed we could  evaluate if there is any endovascular options and confirm the state of her bypass - concern it is occluded.  Marty Heck, MD  [facility]  Office: [phone]  Pager: [phone]

## 2018-02-04 NOTE — Discharge Instructions (Signed)

## 2018-02-04 NOTE — Progress Notes (Addendum)
Notified by lab of critical potassium. RN spoke with Dr. Carlis Abbott. Patient notified RN that she has not been taking her potassium medication. Patient has received 4 IV bags of IV potassium.  Will give oral potassium 40 mg and instructed patient that it is important that she follow up with her primary care Dr. Kris Mouton. Patient is asymptomatic.

## 2018-02-04 NOTE — Progress Notes (Signed)
Spoke with Dr. Carlis Abbott.  He would like patient to have 60 po potassium.  See orders.

## 2018-02-04 NOTE — Op Note (Signed)
Patient name: Leah [name] MRN: [id number] DOB: 1951-02-26 Sex: female  02/04/2018 Pre-operative Diagnosis: Left lower extremity claudication in the setting of remote left common femoral to below-knee popliteal bypass with saphenous vein Post-operative diagnosis:  Same Surgeon:  Marty Heck, MD Procedure Performed: 1.  Ultrasound-guided access of the right common femoral artery 2.  Aortogram with left lower extremity arteriogram 3.  Left external iliac angioplasty with a self-expanding stent placement (5 mm x 100 mm Mustang, 6 mm x 120 mm self expanding Innova, and 6 mm x 30 mm self expanding Absolute Pro) 4.  67 minutes monitored conscious sedation time 5.  Mynx closure right common femoral artery  Indications: Patient is a 67 year old female who was recently seen in clinic with recurrent claudication symptoms in her left lower extremity.  She has a history of a remote left common femoral to below-knee popliteal bypass with saphenous vein by Dr. Kellie Simmering in 2012.  She underwent a lower extremity noninvasive imaging study and they were unable to visualize her bypass.  I subsequently offered her a left lower extremity arteriogram to further evaluate after risks and benefits were discussed.  Findings: Aortogram initially revealed a patent infrarenal aorta as well as a patent right common iliac stent.  Patient additionally has bilateral renal artery stents with a high-grade stenosis of the right renal artery stent greater than 80% proximally and a patent left renal artery stent that has an approximate 50% stenosis.  Her left lower extremity arteriogram revealed a ectatic left common iliac artery with multifocal high-grade stenosis >70% of the proximal left external iliac artery well as a moderate disease throughout the mid external iliac artery artery throughout its course and a normal distal left external iliac artery.  Her left common femoral artery was widely patent as well as her  profunda.  Her left common femoral to below-knee popliteal bypass is widely patent with no evidence of stenosis.  Patient has two-vessel runoff in the left lower extremity with a patent peroneal and posterior tibial artery (the anterior tibial artery appeared occluded).  There did appear to be an approximate 50% stenosis of the proximal peroneal artery that is not flow limiting at this time.   Procedure:  The patient was identified in the holding area and taken to room 8.  The patient was then placed supine on the table and prepped and draped in the usual sterile fashion.  A time out was called.  Ultrasound was used to evaluate the right common femoral artery.  It was patent .  A digital ultrasound image was acquired.  A micropuncture needle was used to access the right common femoral artery under ultrasound guidance.  An 018 wire was advanced without resistance and a micropuncture sheath was placed.  The 018 wire was removed and a benson wire was placed.  The micropuncture sheath was exchanged for a 5 french sheath.  An omniflush catheter was advanced over the wire to the level of L-1.  An abdominal angiogram was obtained.  Next, using the omniflush catheter and a benson wire, the aortic bifurcation was crossed and the catheter was placed into theleft external iliac artery and left runoff was obtained.  Ultimately this revealed that her left common femoral to below-knee popliteal bypass was widely patent with two-vessel runoff via the posterior tibial and peroneal artery.  I thought she had significant inflow disease that is likely contributing to her symptoms given the heavy disease in the proximal to mid left external iliac artery.  Given I had already crossed the left external iliac disease with my Glidewire advantage in order to get the Omni Flush catheter into the distal left external iliac artery. I exchanged for a long 6 [place] Ansell sheath in the right groin that was placed at the aortic bifurcation over  the wire.  The patient was then given 7000 units of IV heparin.  We then obtained some additional pelvic arteriogram imaging using a right anterior oblique.  We elected to treat the proximal to mid left external iliac artery just past the takeoff of the hypogastric artery and initially predilated this with a 5 mm x 100 mm Mustang balloon.  Another hand-injection through the sheath showed heavily calcified diseased artery so we elected to place a stent given residual stenosis greater than 30%.  I initially selected a 6 mm x 120 mm self expanding Innova stent that was deployed from the mid left external iliac artery (just above the femoral head) proximally with a goal of landing the stent just at the takeoff of the left hypogastric.  Unfortunately the stent deployed just short of the lesion in the proximal left external iliac artery.  As a result we selected a second 6 mm x 30 mm self-expanding absolute Pro that was then deployed in the proximal left external iliac artery.  I then postdilated the stent with a 5 mm balloon throughout its course.  A final hand-injection through the sheath showed brisk emptying through the stents into the bypass.  We went down to check the foot and the patient had a palpable posterior tibial pulse in the left foot.  At that point in time we exchanged for a short 6 French sheath in the right groin and then our wires and catheters were removed.  We had to aggressively work on her blood pressure while replacing her potassium in order to get a blood pressure less than 944 systolic to deploy a Mynx closure device in the right groin.  Condition: Stable  Marty Heck, MD [facility] Office: [phone] Pager: [place]

## 2018-02-05 ENCOUNTER — Encounter (HOSPITAL_COMMUNITY): Payer: Self-pay | Admitting: Vascular Surgery

## 2018-02-05 ENCOUNTER — Telehealth: Payer: Self-pay | Admitting: Vascular Surgery

## 2018-02-05 NOTE — Telephone Encounter (Signed)
-----   Message from Mena Goes, RN sent at 02/04/2018  1:50 PM EST ----- Regarding: PA clinic and 2 labs in 1 month    ----- Message ----- From: Marty Heck, MD Sent: 02/04/2018   1:39 PM EST To: [facility], [facility] Charge Pool  Patient name: Leah [name]  MRN: [id number]        DOB: Jun 21, 1950        Sex: female  02/04/2018 Pre-operative Diagnosis: Left lower extremity claudication in the setting of remote left common femoral to below-knee popliteal bypass with saphenous vein Post-operative diagnosis:  Same Surgeon:  Marty Heck, MD Procedure Performed: 1.  Ultrasound-guided access of the right common femoral artery 2.  Aortogram with left lower extremity arteriogram 3.  Left external iliac angioplasty with a self-expanding stent placement (5 mm x 100 mm Mustang, 6 mm x 120 mm self expanding Innova, and 6 mm x 30 mm self expanding Absolute Pro) 4.  67 minutes monitored conscious sedation time 5.  Mynx closure right common femoral artery  #F/U in one month in PA clinic is fine.  Arterial duplex left iliac and ABI.  Thanks,  [place] Stabs

## 2018-02-05 NOTE — Telephone Encounter (Signed)
sch appt lvm mld ltr 03/04/18 1pm ABI 2pm LE Art 230pm p/o PA

## 2018-02-06 MED FILL — Hydralazine HCl Inj 20 MG/ML: INTRAMUSCULAR | Qty: 1 | Status: AC

## 2018-02-09 ENCOUNTER — Other Ambulatory Visit: Payer: Self-pay

## 2018-02-09 DIAGNOSIS — I739 Peripheral vascular disease, unspecified: Secondary | ICD-10-CM

## 2018-02-12 ENCOUNTER — Ambulatory Visit (INDEPENDENT_AMBULATORY_CARE_PROVIDER_SITE_OTHER): Payer: Medicare Other | Admitting: Family Medicine

## 2018-02-12 ENCOUNTER — Encounter: Payer: Self-pay | Admitting: Family Medicine

## 2018-02-12 DIAGNOSIS — Z720 Tobacco use: Secondary | ICD-10-CM | POA: Diagnosis not present

## 2018-02-12 DIAGNOSIS — I739 Peripheral vascular disease, unspecified: Secondary | ICD-10-CM | POA: Diagnosis not present

## 2018-02-12 DIAGNOSIS — E876 Hypokalemia: Secondary | ICD-10-CM | POA: Insufficient documentation

## 2018-02-12 DIAGNOSIS — I6523 Occlusion and stenosis of bilateral carotid arteries: Secondary | ICD-10-CM | POA: Diagnosis not present

## 2018-02-12 NOTE — Progress Notes (Signed)
Subjective  Leah [name] is a 67 y.o. female is presenting with the following  HYPOKALEMIA Was discharged on 11/13 after vascular procedure with apparently a low K.  No DC summary has been done.   Last K value was 2.0.   Has not been having cramps or palpitations or chest pain or shortness of breath   TOBACCO ABUSE Smokes 1/2 ppd.  Feels like they are an old friend and really help with stress.  She knows she needs to stop.  They make her vascular problems worse.  Also she has intermittent cough No chest pain with exertion  PVD Legs feel good.  No pain with walking, no sores  Chief Complaint noted Review of Symptoms - see HPI PMH - Smoking status noted.    Objective Vital Signs reviewed BP 122/75   Pulse 77   Temp 98.1 F (36.7 C) (Oral)   Wt 160 lb (72.6 kg)   SpO2 97%   BMI 21.70 kg/m  Heart - Regular rate and rhythm.  No murmurs, gallops or rubs.    Lungs:  Normal respiratory effort, chest expands symmetrically. Lungs are clear to auscultation, no crackles or wheezes. Legs - warm with good cap refill  Assessments/Plans  See after visit summary for details of patient instuctions  Tobacco use A significant problem given her vascular disease.  Discussed pros cons and approaches   Hypokalemia Was severely low at hospital discharge and has not been checked.  She is taking her K as per her medication list.  Will recheck   PVD- s/p multiple proceedures Intervention seems to have been successful.  Follow up with vascular as scheduled

## 2018-02-12 NOTE — Assessment & Plan Note (Signed)
Was severely low at hospital discharge and has not been checked.  She is taking her K as per her medication list.  Will recheck

## 2018-02-12 NOTE — Assessment & Plan Note (Signed)
A significant problem given her vascular disease.  Discussed pros cons and approaches

## 2018-02-12 NOTE — Patient Instructions (Addendum)
Good to see you today!  Thanks for coming in.  For the Low Potassium - keep taking all the medications as your are - we will contact if you need to make a change - if you have bad cramps or irregular heart beat or chest pain or lightheadness call us or go to the ER  Figure out what is better about not smoking than smoking Save $1000 a year   Make an appointment with Dr Kris Mouton in for a physical - let him know you have a hysterectomy

## 2018-02-12 NOTE — Assessment & Plan Note (Signed)
Intervention seems to have been successful.  Follow up with vascular as scheduled

## 2018-02-13 ENCOUNTER — Encounter: Payer: Self-pay | Admitting: Family Medicine

## 2018-02-13 ENCOUNTER — Telehealth: Payer: Self-pay

## 2018-02-13 LAB — BASIC METABOLIC PANEL
BUN/Creatinine Ratio: 6 — ABNORMAL LOW (ref 12–28)
BUN: 6 mg/dL — AB (ref 8–27)
CO2: 23 mmol/L (ref 20–29)
Calcium: 9.6 mg/dL (ref 8.7–10.3)
Chloride: 105 mmol/L (ref 96–106)
Creatinine, Ser: 1.08 mg/dL — ABNORMAL HIGH (ref 0.57–1.00)
GFR, EST AFRICAN AMERICAN: 62 mL/min/{1.73_m2} (ref >59)
GFR, EST NON AFRICAN AMERICAN: 54 mL/min/{1.73_m2} — AB (ref >59)
Glucose: 69 mg/dL (ref 65–99)
POTASSIUM: 3.2 mmol/L — AB (ref 3.5–5.2)
Sodium: 145 mmol/L — ABNORMAL HIGH (ref 134–144)

## 2018-02-13 NOTE — Telephone Encounter (Signed)
Spoke with her  Let her know I had sent a letter and that all her tests were ok and she should continue all medications as she is  She agrees

## 2018-02-13 NOTE — Telephone Encounter (Signed)
Calling for lab results from 02/12/18  [place], RN [facility] RN)

## 2018-02-23 ENCOUNTER — Other Ambulatory Visit: Payer: Self-pay | Admitting: Family Medicine

## 2018-02-23 DIAGNOSIS — G8918 Other acute postprocedural pain: Secondary | ICD-10-CM

## 2018-02-26 NOTE — Telephone Encounter (Signed)
Pt called to check status. Fleeger, Salome Spotted, CMA

## 2018-03-03 NOTE — Progress Notes (Signed)
Cardiology Office Note    Date:  03/04/2018   ID:  Leah [name], DOB 12-25-1950, MRN [id number]  PCP:  Guadalupe Dawn, MD  Cardiologist: Dr. Martinique  Chief Complaint  Patient presents with  . Congestive Heart Failure  . Coronary Artery Disease    History of Present Illness:    Leah [name] is a 67 y.o. female with past medical history of CAD (s/p LIMA-LAD in 11/2014), MVR (in 11/2014 with a pericardial tissue valve), HTN, HLD, COPD, and PVD (s/p L CEA and right external iliac stenting), CHF who is seen today for follow up.   In August 2018 she had pre op evaluation with a Myoview study that showed no ischemic and reduced EF. She had a right upper lobe lung nodule which had increased in size. She underwent resection in September 2018 with pathology showing adenocarcinoma stage 1. Negative margins. Echo performed in October showed EF 40% with normally functioning Mitral valve prosthesis.   She was admitted in March 2019 with CHF exacerbation. Responded to IV diuresis. Continued on other medical therapy. CT of the chest showed no PE. Discharge weight 168 lbs.   On 02/04/18 she underwent LE angiogram for left leg claudication. She had PTA/stenting of the left external iliac artery by Dr. Fortunato Curling. She was also noted to have an 80% right renal artery stenosis. This was a difficult procedure because she had severe back spasms. Her potassium was also low.   Today she still complains of pain in her back. Weight is down. She has no increased dyspnea or edema. No chest pain. Hasn't walked much due to back pain.     Past Medical History:  Diagnosis Date  . Anemia   . Bronchogenic lung cancer, right ([place]) 11/29/2016  . CAD (coronary artery disease)    a. s/p LIMA-LAD in 11/2014  . Carotid artery occlusion   . CHF (congestive heart failure) ([place])   . Complication of anesthesia    slow to awaken x 1 maybe 2001  . COPD (chronic obstructive pulmonary disease) ([place])   . GERD  (gastroesophageal reflux disease)    "sometimes" takes Copywriter, advertising or drinks gingerale   . Headache(784.0)   . History of kidney stones   . Hyperlipidemia   . Hypertension   . Leg pain   . Peripheral vascular disease ([place])   . Renal vascular disease 10/26/2014   bilateral stents placed   . Severe mitral regurgitation    a. s/p MVR in 11/2014 with a pericardial tissue valve  . Shortness of breath dyspnea   . Tobacco abuse     Past Surgical History:  Procedure Laterality Date  . ABDOMINAL HYSTERECTOMY    . ANGIOPLASTY / STENTING ILIAC  2010   right external iliac by Dr. Irish Lack  . CARDIAC CATHETERIZATION  11/13/11   Left Heart Cath. with Coronary Angiogram  . CARDIAC CATHETERIZATION N/A 08/24/2014   Procedure: Left Heart Cath and Coronary Angiography;  Surgeon: Wellington Hampshire, MD;  Location: [facility];  Service: Cardiovascular;  Laterality: N/A;  . CARDIAC CATHETERIZATION N/A 10/26/2014   Procedure: Right/Left Heart Cath and Coronary Angiography;  Surgeon: Peter M Martinique, MD; oLAD 70%, mLAD 70% ISR, D2 30%, OFC 30%, RCA 20%, EF nl, low R heart pressures after diuresis, severe MR  . CAROTID ENDARTERECTOMY  11/21/2007   left  . COLONOSCOPY W/ POLYPECTOMY    . CORONARY ANGIOPLASTY WITH STENT PLACEMENT  6/09   LAD 2.5x12 [place]  . CORONARY  ARTERY BYPASS GRAFT N/A 12/05/2014   Procedure: CORONARY ARTERY BYPASS GRAFTING (CABG);  Surgeon: [name], MD;  Location: [facility];  Service: Open Heart Surgery;  Laterality: N/A;  Times 1 using left internal mammary artery to LAD  . FEMORAL-POPLITEAL BYPASS GRAFT  10/12   left Dr. Kellie Simmering  . LOWER EXTREMITY ANGIOGRAPHY N/A 02/04/2018   Procedure: LOWER EXTREMITY ANGIOGRAPHY;  Surgeon: Marty Heck, MD;  Location: [facility];  Service: Cardiovascular;  Laterality: N/A;  . MITRAL VALVE REPAIR  2016  . MITRAL VALVE REPLACEMENT N/A 12/05/2014   Procedure: MITRAL VALVE (MV) REPLACEMENT;  Surgeon: [name], MD;   Location: [facility];  Service: Open Heart Surgery;  Laterality: N/A;  Closure left atrial appendage  . PERIPHERAL VASCULAR INTERVENTION Left 02/04/2018   Procedure: PERIPHERAL VASCULAR INTERVENTION;  Surgeon: Marty Heck, MD;  Location: [facility];  Service: Cardiovascular;  Laterality: Left;  external iliac  . RENAL ARTERY STENT Bilateral   . TEE WITHOUT CARDIOVERSION N/A 10/24/2014   Procedure: TRANSESOPHAGEAL ECHOCARDIOGRAM (TEE);  Surgeon: Thayer Headings, MD;  Location: [facility];  Service: Cardiovascular;  Laterality: N/A;  . TEE WITHOUT CARDIOVERSION N/A 12/05/2014   Procedure: TRANSESOPHAGEAL ECHOCARDIOGRAM (TEE);  Surgeon: [name], MD;  Location: [facility];  Service: Open Heart Surgery;  Laterality: N/A;  . VIDEO ASSISTED THORACOSCOPY (VATS)/WEDGE RESECTION Right 11/29/2016   Procedure: VIDEO ASSISTED THORACOSCOPY (VATS)/ RUL WEDGE RESECTION OF LESION/ NODE SAMPLING;  Surgeon: [name], MD;  Location: [facility];  Service: Thoracic;  Laterality: Right;  Marland Kitchen VIDEO BRONCHOSCOPY N/A 11/29/2016   Procedure: VIDEO BRONCHOSCOPY;  Surgeon: [name], MD;  Location: [facility];  Service: Thoracic;  Laterality: N/A;    Current Medications: Outpatient Medications Prior to Visit  Medication Sig Dispense Refill  . albuterol (PROVENTIL HFA;VENTOLIN HFA) 108 (90 Base) MCG/ACT inhaler Inhale 2 puffs into the lungs every 6 (six) hours as needed for wheezing or shortness of breath. 1 Inhaler 0  . aspirin EC 81 MG tablet Take 1 tablet (81 mg total) by mouth daily. 90 tablet 1  . atorvastatin (LIPITOR) 80 MG tablet Take 1 tablet (80 mg total) by mouth daily. (Patient taking differently: Take 80 mg by mouth 2 (two) times a week. ) 30 tablet 11  . clopidogrel (PLAVIX) 75 MG tablet Take 1 tablet (75 mg total) by mouth daily. 30 tablet 11  . cyclobenzaprine (FLEXERIL) 10 MG tablet Take 1 tablet (10 mg total) by mouth 3 (three) times daily as needed. for muscle spams (Patient taking  differently: Take 10 mg by mouth 3 (three) times daily as needed for muscle spasms. ) 60 tablet 2  . diclofenac sodium (VOLTAREN) 1 % GEL Apply 4 g topically 4 (four) times daily. (Patient taking differently: Apply 4 g topically 4 (four) times daily as needed (for back pain.). ) 100 g 0  . furosemide (LASIX) 40 MG tablet Take 0.5 tablets (20 mg total) by mouth daily. 30 tablet 6  . gabapentin (NEURONTIN) 100 MG capsule Take 2 capsules (200 mg total) by mouth 3 (three) times daily. 120 capsule 3  . isosorbide-hydrALAZINE (BIDIL) 20-37.5 MG tablet Take 1 tablet by mouth 3 (three) times daily. 90 tablet 11  . metoprolol succinate (TOPROL-XL) 25 MG 24 hr tablet Take 1 tablet (25 mg total) by mouth daily. (Patient taking differently: Take 25 mg by mouth daily at 3 pm. ) 90 tablet 3  . nitroGLYCERIN (NITROSTAT) 0.4 MG SL tablet Place  1 tablet (0.4 mg total) under the tongue every 5 (five) minutes x 3 doses as needed for chest pain. 25 tablet 12  . potassium chloride SA (K-DUR,KLOR-CON) 20 MEQ tablet Take 2 tablets ( 40 meq ) twice a day (Patient taking differently: Take 20 mEq by mouth 3 (three) times a week. ) 120 tablet 6  . traMADol (ULTRAM) 50 MG tablet TAKE 1 TABLET BY MOUTH EVERY 6 HOURS AS NEEDED FOR PAIN 60 tablet 0  . traZODone (DESYREL) 100 MG tablet Take 100 mg by mouth at bedtime as needed for sleep (Patient taking differently: Take 100 mg by mouth at bedtime. ) 30 tablet 2   No facility-administered medications prior to visit.      Allergies:   Lisinopril; Chantix [varenicline]; and Penicillins   Social History   Socioeconomic History  . Marital status: Divorced    Spouse name: Not on file  . Number of children: 4  . Years of education: Not on file  . Highest education level: Not on file  Occupational History  . Occupation: retired  Scientific laboratory technician  . Financial resource strain: Not on file  . Food insecurity:    Worry: Not on file    Inability: Not on file  . Transportation needs:      Medical: Not on file    Non-medical: Not on file  Tobacco Use  . Smoking status: Current Every Day Smoker    Packs/day: 0.50    Years: 35.00    Pack years: 17.50    Types: Cigarettes  . Smokeless tobacco: Never Used  Substance and Sexual Activity  . Alcohol use: No    Alcohol/week: 0.0 standard drinks  . Drug use: No  . Sexual activity: Never  Lifestyle  . Physical activity:    Days per week: Not on file    Minutes per session: Not on file  . Stress: Not on file  Relationships  . Social connections:    Talks on phone: Not on file    Gets together: Not on file    Attends religious service: Not on file    Active member of club or organization: Not on file    Attends meetings of clubs or organizations: Not on file    Relationship status: Not on file  Other Topics Concern  . Not on file  Social History Narrative  . Not on file     Family History:  The patient's family history includes ALS in her brother; Cancer in her mother; Heart disease in her father; Hypertension in her brother, father, and mother; Kidney disease in her father; Prostate cancer in her father; Stroke in her paternal aunt.   Review of Systems:   Please see the history of present illness.     All other systems reviewed and are otherwise negative except as noted above.   Physical Exam:    VS:  BP (!) 151/67   Pulse 82   Ht 6' 1.5" (1.867 m)   Wt 151 lb 12.8 oz (68.9 kg)   SpO2 97%   BMI 19.76 kg/m    GENERAL:  Well appearing, thin BF in NAD HEENT:  PERRL, EOMI, sclera are clear. Oropharynx is clear. NECK:  No jugular venous distention, carotid upstroke brisk and symmetric, no bruits, no thyromegaly or adenopathy LUNGS:  Clear to auscultation bilaterally CHEST:  Unremarkable HEART:  RRR,  PMI not displaced or sustained,S1 and S2 within normal limits, no S3, no S4: no clicks, no rubs, no murmurs ABD:  Soft, nontender. BS +, no masses or bruits. No hepatomegaly, no splenomegaly EXT:  2 + femoral  pulse, no edema, no cyanosis no clubbing SKIN:  Warm and dry.  No rashes NEURO:  Alert and oriented x 3. Cranial nerves II through XII intact. PSYCH:  Cognitively intact      Wt Readings from Last 3 Encounters:  03/04/18 151 lb 12.8 oz (68.9 kg)  02/12/18 160 lb (72.6 kg)  02/04/18 157 lb (71.2 kg)     Studies/Labs Reviewed:   EKG:  EKG is not ordered today.    Recent Labs: 06/05/2017: Hemoglobin 12.9; Platelets 210 06/13/2017: ALT 21 08/28/2017: BNP 395.5 02/12/2018: BUN 6; Creatinine, Ser 1.08; Potassium 3.2; Sodium 145   Lipid Panel    Component Value Date/Time   CHOL 156 06/13/2017 1104   TRIG 98 06/13/2017 1104   HDL 45 06/13/2017 1104   CHOLHDL 4.8 (H) 11/18/2016 0844   CHOLHDL 5.6 (H) 01/15/2016 1117   VLDL 24 01/15/2016 1117   LDLCALC 91 06/13/2017 1104   LDLDIRECT 185 (A) 09/09/2011 0820    Additional studies/ records that were reviewed today include:   Myoview 11/20/16: Study Highlights    Nuclear stress EF: 31%. The left ventricular ejection fraction is moderately decreased (30-44%).  Defect 1: There is a medium defect of moderate severity present in the apical anterior, apical septal, apical inferior and apex location.  Findings consistent with prior apical myocardial infarction.  This is a high risk study based on severely reduced LV function . There is no evidence of ischemia   Echo 01/13/17: Study Conclusions  - Left ventricle: The cavity size was normal. Wall thickness was   normal. Systolic function was mildly to moderately reduced. The   estimated ejection fraction was 40%. Diffuse hypokinesis. There   was a reduced contribution of atrial contraction to ventricular   filling, due to increased ventricular diastolic pressure or   atrial contractile dysfunction. Doppler parameters are consistent   with high ventricular filling pressure. - Aortic valve: There was mild regurgitation. - Mitral valve: A bioprosthesis was present. with normal  function.   Mean gradient (D): 2 mm Hg. Peak gradient (D): 10 mm Hg. - Left atrium: The atrium was moderately dilated. - Pulmonary arteries: PA peak pressure: 38 mm Hg (S).   Assessment:    1. Chronic systolic CHF (congestive heart failure) ([place])   2. CAD S/P LAD DES 2009 with 70% ISR 08/24/14   3. S/P MVR (mitral valve replacement)   4. PAD (peripheral artery disease) ([place])   5. Essential hypertension, benign   6. PVD- s/p multiple proceedures      Plan:   In order of problems listed above:  1. CAD - s/p LIMA-LAD in 11/2014.  - no anginal symptoms. -  Lexiscan Myoview in August 2018 showed no ischemia but reduced EF   - continue ASA, statin, and BB therapy.   2. S/p MVR - performed in in 11/2014 with a pericardial tissue valve. Echo in August 2018 showed a stable prosthesis with no obvious MR.  - No significant murmur appreciated on examination today.   3. Chronic systolic CHF EF 41%. In review of records she had EF 45-50% post MVR. "normal" EF prior to surgery but I suspect there was latent LV dysfunction in setting of severe MR. Follow up Echo in October 2018 showed EF 40%.  She has stable class 1-2 symptoms. She is not a candidate for ACEi or ARB due to history of angioedema on  lisinopril. Continue Bidil 20/37.5 mg tid. and metoprolol. continue current lasix dose. Recent hypokalemia repleted. Will need to stay on higher potassium dose.    4. HTN - BP is well controlled.  5. HLD - Lipid Panel shows LDL 91 since more compliant with lipitor. Encourage compliance. Ideally would like LDL < 70 given extensive vascular disease. Will order Zetia 10 mg daily. Given Good Rx card to help cover cost.    6. PVD -  s/p L CEA and left fem pop BPG. Bilateral carotid bruits. Carotid dopplers in January showed moderate bilateral disease. No AAA. Recent left external iliac stenting. RAS s/p bilateral stenting- now 80% on right and 50% on the left.  - continue ASA and statin therapy.   7.  Tobacco Use - still smoking 0.5 ppd. Encouraged complete smoking cessation  8. Adenocarcinoma lung. S/p resection. Stage 1. No recurrence by CT.    Signed, Peter Martinique, MD  03/04/2018 10:00 AM    [address], [place], [place]  [address] Phone: [phone]; Fax: [phone]  [address] [place], [place] [address] Phone: [phone]

## 2018-03-04 ENCOUNTER — Ambulatory Visit (INDEPENDENT_AMBULATORY_CARE_PROVIDER_SITE_OTHER): Payer: Medicare Other | Admitting: Cardiology

## 2018-03-04 ENCOUNTER — Ambulatory Visit (INDEPENDENT_AMBULATORY_CARE_PROVIDER_SITE_OTHER): Payer: Medicare Other | Admitting: Physician Assistant

## 2018-03-04 ENCOUNTER — Ambulatory Visit (HOSPITAL_COMMUNITY)
Admission: RE | Admit: 2018-03-04 | Discharge: 2018-03-04 | Disposition: A | Discharge status: Home / Self Care | Payer: Medicare Other | Source: Ambulatory Visit | Attending: Vascular Surgery | Admitting: Vascular Surgery

## 2018-03-04 ENCOUNTER — Encounter: Payer: Self-pay | Admitting: Cardiology

## 2018-03-04 ENCOUNTER — Ambulatory Visit (INDEPENDENT_AMBULATORY_CARE_PROVIDER_SITE_OTHER)
Admission: RE | Admit: 2018-03-04 | Discharge: 2018-03-04 | Disposition: A | Discharge status: Home / Self Care | Payer: Medicare Other | Source: Ambulatory Visit | Attending: Vascular Surgery | Admitting: Vascular Surgery

## 2018-03-04 VITALS — BP 135/65 | HR 69 | Temp 97.0°F | Resp 14 | Ht 73.5 in | Wt 151.0 lb

## 2018-03-04 VITALS — BP 151/67 | HR 82 | Ht 73.5 in | Wt 151.8 lb

## 2018-03-04 DIAGNOSIS — I6523 Occlusion and stenosis of bilateral carotid arteries: Secondary | ICD-10-CM

## 2018-03-04 DIAGNOSIS — I739 Peripheral vascular disease, unspecified: Secondary | ICD-10-CM | POA: Insufficient documentation

## 2018-03-04 DIAGNOSIS — Z9861 Coronary angioplasty status: Secondary | ICD-10-CM | POA: Diagnosis not present

## 2018-03-04 DIAGNOSIS — I5022 Chronic systolic (congestive) heart failure: Secondary | ICD-10-CM

## 2018-03-04 DIAGNOSIS — Z720 Tobacco use: Secondary | ICD-10-CM | POA: Diagnosis not present

## 2018-03-04 DIAGNOSIS — I10 Essential (primary) hypertension: Secondary | ICD-10-CM

## 2018-03-04 DIAGNOSIS — I2510 Atherosclerotic heart disease of native coronary artery without angina pectoris: Secondary | ICD-10-CM | POA: Diagnosis not present

## 2018-03-04 DIAGNOSIS — Z952 Presence of prosthetic heart valve: Secondary | ICD-10-CM

## 2018-03-04 MED ORDER — EZETIMIBE 10 MG PO TABS: 10.0000 mg | ORAL_TABLET | Freq: Every day | ORAL | 3 refills | Status: DC

## 2018-03-04 NOTE — Progress Notes (Signed)
Postoperative Visit ([facility])   History of Present Illness   Leah [name] is a 67 y.o. female who presents to go over vascular studies s/p L EIA stenting by Dr. Carlis Abbott 02/04/18.  This was indicated due to increasing claudication symptoms of left lower extremity as well as a perceived threatened left femoral to popliteal bypass.  She states that her claudication symptoms have improved postoperatively.  She denies any rest pain or tissue loss.  She is taking aspirin in addition to Plavix.  She is also on daily Lipitor.  She remains a current smoker however is working on quitting.  She denies any ongoing pain at her right groin catheterization site.  It should also be noted that patient has bilateral renal artery stents that were seen to have in-stent stenosis on aortogram.  BP today is 135/65.  Patient states she is well controlled on her current antihypertensive regimen.   Current Outpatient Medications  Medication Sig Dispense Refill  . albuterol (PROVENTIL HFA;VENTOLIN HFA) 108 (90 Base) MCG/ACT inhaler Inhale 2 puffs into the lungs every 6 (six) hours as needed for wheezing or shortness of breath. 1 Inhaler 0  . aspirin EC 81 MG tablet Take 1 tablet (81 mg total) by mouth daily. 90 tablet 1  . atorvastatin (LIPITOR) 80 MG tablet Take 1 tablet (80 mg total) by mouth daily. (Patient taking differently: Take 80 mg by mouth 2 (two) times a week. ) 30 tablet 11  . clopidogrel (PLAVIX) 75 MG tablet Take 1 tablet (75 mg total) by mouth daily. 30 tablet 11  . cyclobenzaprine (FLEXERIL) 10 MG tablet Take 1 tablet (10 mg total) by mouth 3 (three) times daily as needed. for muscle spams (Patient taking differently: Take 10 mg by mouth 3 (three) times daily as needed for muscle spasms. ) 60 tablet 2  . diclofenac sodium (VOLTAREN) 1 % GEL Apply 4 g topically 4 (four) times daily. (Patient taking differently: Apply 4 g topically 4 (four) times daily as needed (for back pain.). ) 100 g 0  . ezetimibe  (ZETIA) 10 MG tablet Take 1 tablet (10 mg total) by mouth daily. 90 tablet 3  . furosemide (LASIX) 40 MG tablet Take 0.5 tablets (20 mg total) by mouth daily. 30 tablet 6  . gabapentin (NEURONTIN) 100 MG capsule Take 2 capsules (200 mg total) by mouth 3 (three) times daily. 120 capsule 3  . isosorbide-hydrALAZINE (BIDIL) 20-37.5 MG tablet Take 1 tablet by mouth 3 (three) times daily. 90 tablet 11  . metoprolol succinate (TOPROL-XL) 25 MG 24 hr tablet Take 1 tablet (25 mg total) by mouth daily. (Patient taking differently: Take 25 mg by mouth daily at 3 pm. ) 90 tablet 3  . nitroGLYCERIN (NITROSTAT) 0.4 MG SL tablet Place 1 tablet (0.4 mg total) under the tongue every 5 (five) minutes x 3 doses as needed for chest pain. 25 tablet 12  . potassium chloride SA (K-DUR,KLOR-CON) 20 MEQ tablet Take 2 tablets ( 40 meq ) twice a day (Patient taking differently: Take 20 mEq by mouth 3 (three) times a week. ) 120 tablet 6  . traMADol (ULTRAM) 50 MG tablet TAKE 1 TABLET BY MOUTH EVERY 6 HOURS AS NEEDED FOR PAIN 60 tablet 0  . traZODone (DESYREL) 100 MG tablet Take 100 mg by mouth at bedtime as needed for sleep (Patient taking differently: Take 100 mg by mouth at bedtime. ) 30 tablet 2   No current facility-administered medications for this visit.  ROS: 10 system ROS is negative unless otherwise noted in HPI   For VQI Use Only   PRE-ADM LIVING: Home  AMB STATUS: Ambulatory   Physical Examination   Vitals:   03/04/18 1347  BP: 135/65  Pulse: 69  Resp: 14  Temp: (!) 97 F (36.1 C)  SpO2: 99%  Weight: 151 lb (68.5 kg)  Height: 6' 1.5" (1.867 m)   Body mass index is 19.65 kg/m.  General Alert, O x 3, WD, NAD  Pulmonary Sym exp, good B air movt,  Cardiac RRR, Nl S1, S2,   Vascular Vessel Right Left  Radial Palpable Palpable  Aorta Not palpable N/A  Femoral Palpable Palpable  Popliteal Not palpable Not palpable  PT Dopplerable Faintly palpable    Gastrointestinal soft, non-distended,  non-tender to palpation,   Musculoskeletal M/S 5/5 throughout  , Extremities without ischemic changes  , No edema present,  Neurologic  Pain and light touch intact in extremities , Motor exam as listed above    Medical Decision Making   Leah [name] is a 67 y.o. female who presents s/p left external iliac artery stenting by Dr. Carlis Abbott 02/04/2018.   Resolved claudication symptoms of left lower extremity  Velocity of 258 cm/s in proximal iliac stent however patient has a palpable left PT  Encouraged ambulation  Encouraged smoking cessation  Continue aspirin, Plavix, and statin regimen  Plan will be to recheck aortoiliac duplex and ABIs in 3 months   Leah Ligas PA-C [facility] Office: [phone]

## 2018-03-04 NOTE — Patient Instructions (Addendum)
Continue your current therapy  We will add Zetia 10 mg daily for cholesterol  Follow up in 6 months

## 2018-03-06 ENCOUNTER — Ambulatory Visit: Payer: Medicare Other | Admitting: Family Medicine

## 2018-03-10 ENCOUNTER — Other Ambulatory Visit: Payer: Self-pay

## 2018-03-10 MED ORDER — ISOSORB DINITRATE-HYDRALAZINE 20-37.5 MG PO TABS: 1.0000 | ORAL_TABLET | Freq: Three times a day (TID) | ORAL | 11 refills | Status: DC

## 2018-03-11 ENCOUNTER — Telehealth: Payer: Self-pay | Admitting: *Deleted

## 2018-03-11 NOTE — Telephone Encounter (Signed)
Received call from patient, PA needed on Bidil. Contacted pharmacy to verify and PA is needed.   Clinical questions submitted via Cover My Meds.  Waiting on response, could take up to 72 hours.  Cover My Meds info: Key: [id number]   Saretta Dahlem, Salome Spotted, [place]

## 2018-03-12 NOTE — Telephone Encounter (Signed)
Med approved.   Pharmacy and pt informed. Jameca Chumley, Salome Spotted, CMA

## 2018-03-16 ENCOUNTER — Encounter: Payer: Self-pay | Admitting: Family Medicine

## 2018-03-16 ENCOUNTER — Ambulatory Visit (INDEPENDENT_AMBULATORY_CARE_PROVIDER_SITE_OTHER): Payer: Medicare Other | Admitting: Family Medicine

## 2018-03-16 VITALS — BP 122/60 | HR 76 | Temp 97.6°F | Wt 152.2 lb

## 2018-03-16 DIAGNOSIS — J069 Acute upper respiratory infection, unspecified: Secondary | ICD-10-CM | POA: Insufficient documentation

## 2018-03-16 DIAGNOSIS — I6523 Occlusion and stenosis of bilateral carotid arteries: Secondary | ICD-10-CM | POA: Diagnosis not present

## 2018-03-16 DIAGNOSIS — M545 Low back pain, unspecified: Secondary | ICD-10-CM

## 2018-03-16 DIAGNOSIS — G8929 Other chronic pain: Secondary | ICD-10-CM

## 2018-03-16 NOTE — Assessment & Plan Note (Signed)
Patient with likely viral upper respiratory illness.  Centor criteria 0.  Symptoms been present for at least 72 hours so outside range for Tamiflu to be effective.  Recommend supportive care, return to clinic as needed.

## 2018-03-16 NOTE — Progress Notes (Signed)
   HPI 67 year old female who presents for cough, fever, muscle aches.  She states her symptoms were started on 03/13/2018.  She has had a cough but is not productive.  No sore throat.  Started gradually on Friday and have been sort of waxing and waning in intensity.  She does endorse having fevers and chills and muscle aches.  Patient is also complaining of some lumbar thoracic back pain.  She recently seen by her vascular surgeon who would not give her any pain medication.  She states that the trial of the tramadol that she chronically takes is not working anymore.  The pain is a sharp stabbing pain that is limited to her lumbar thoracic spine on the left side.  CC: Back pain and cold   ROS:   Review of Systems See HPI for ROS.   CC, SH/smoking status, and VS noted  Objective: BP 122/60   Pulse 76   Temp 97.6 F (36.4 C) (Oral)   Wt 152 lb 3.2 oz (69 kg)   SpO2 94%   BMI 19.81 kg/m  Gen: 67 year old African-American female, resting comfortably, no acute distress HEENT: Because membranes, no erythematous tonsils CV: Regular rate rhythm, no M/R/G.  Palpable PT DP bilateral feet Resp: CTAB, no wheezes, non-labored Neuro: Alert and oriented, Speech clear, No gross deficits Back: Some limitation with flexion extension of back.  Tenderness to palpation left back in lumbar and thoracic distribution.   Assessment and plan:  Viral upper respiratory illness Patient with likely viral upper respiratory illness.  Centor criteria 0.  Symptoms been present for at least 72 hours so outside range for Tamiflu to be effective.  Recommend supportive care, return to clinic as needed.  Low back pain States her tramadol is not working more.  Will get lumbar and thoracic spine x-rays to further characterize her pain.  If these are negative then will likely need to have MRI.   Orders Placed This Encounter  Procedures  . DG Lumbar Spine Complete    Standing Status:   Future    Standing  Expiration Date:   05/18/2019    Order Specific Question:   Reason for Exam (SYMPTOM  OR DIAGNOSIS REQUIRED)    Answer:   back pain    Order Specific Question:   Preferred imaging location?    Answer:   [facility]    Order Specific Question:   Radiology Contrast Protocol - do NOT remove file path    Answer:   \\[email or web address]  . DG Thoracic Spine W/Swimmers    Standing Status:   Future    Standing Expiration Date:   05/18/2019    Order Specific Question:   Reason for Exam (SYMPTOM  OR DIAGNOSIS REQUIRED)    Answer:   back pain    Order Specific Question:   Preferred imaging location?    Answer:   [facility]    Order Specific Question:   Radiology Contrast Protocol - do NOT remove file path    Answer:   \\[email or web address]    No orders of the defined types were placed in this encounter.    Guadalupe Dawn MD PGY-2 Family Medicine Resident  03/16/2018 10:29 AM

## 2018-03-16 NOTE — Patient Instructions (Signed)
It was great seeing you again today!  I am sorry you have been having so much trouble with your cough and fevers.  I think it is due to a viral upper respiratory infection.  We discussed that the good news is that you will resolve this on your own and the bad news is that there is really no medication we can give you to make your way.  Continue to hydrate and eat well.  Get plenty of rest.  You can pick up some over-the-counter medications to help alleviate some of the symptoms.  Regarding your lumbar and thoracic back pain.  We will get some lower and mid back x-rays to evaluate possible causes.  You can walk-in to get this done as I already placed the order.  I will call you with the results of that come back.

## 2018-03-16 NOTE — Assessment & Plan Note (Signed)
States her tramadol is not working more.  Will get lumbar and thoracic spine x-rays to further characterize her pain.  If these are negative then will likely need to have MRI.

## 2018-03-17 ENCOUNTER — Other Ambulatory Visit: Payer: Self-pay | Admitting: Family Medicine

## 2018-03-17 DIAGNOSIS — G8918 Other acute postprocedural pain: Secondary | ICD-10-CM

## 2018-04-21 ENCOUNTER — Other Ambulatory Visit: Payer: Self-pay | Admitting: Cardiology

## 2018-04-21 DIAGNOSIS — I6523 Occlusion and stenosis of bilateral carotid arteries: Secondary | ICD-10-CM

## 2018-04-24 ENCOUNTER — Other Ambulatory Visit: Payer: Self-pay

## 2018-04-24 ENCOUNTER — Telehealth: Payer: Self-pay

## 2018-04-24 ENCOUNTER — Ambulatory Visit (HOSPITAL_COMMUNITY)
Admission: RE | Admit: 2018-04-24 | Discharge: 2018-04-24 | Disposition: A | Discharge status: Home / Self Care | Payer: Medicare Other | Source: Ambulatory Visit | Attending: Cardiology | Admitting: Cardiology

## 2018-04-24 ENCOUNTER — Encounter (HOSPITAL_COMMUNITY): Payer: Medicaid Other

## 2018-04-24 DIAGNOSIS — I6523 Occlusion and stenosis of bilateral carotid arteries: Secondary | ICD-10-CM

## 2018-04-24 NOTE — Telephone Encounter (Signed)
Spoke to patient carotid doppler results given.She stated she has been having chest pain.Stated she blacked out x 1 last week.She also has been having a dry cough.Stated she just don't feel good.No chest pain at present.Appointment scheduled with Dr.Jordan 04/28/18 at 3:20 pm.Advised to go to ED if she has any more chest pain or blacks out.

## 2018-04-24 NOTE — Progress Notes (Signed)
ca

## 2018-04-26 ENCOUNTER — Other Ambulatory Visit: Payer: Self-pay | Admitting: Family Medicine

## 2018-04-26 DIAGNOSIS — G8918 Other acute postprocedural pain: Secondary | ICD-10-CM

## 2018-04-26 NOTE — Progress Notes (Signed)
Cardiology Office Note    Date:  04/28/2018   ID:  Leah [name], Alferd Apa 06/13/1950, MRN [id number]  PCP:  Guadalupe Dawn, MD  Cardiologist: Dr. Martinique  No chief complaint on file.   History of Present Illness:    Leah [name] is a 68 y.o. female is seen for evaluation of chest pain and blacking out. She has a past medical history of CAD (s/p LIMA-LAD in 11/2014), MVR (in 11/2014 with a pericardial tissue valve), HTN, HLD, COPD, and PVD (s/p L CEA and right external iliac stenting), CHF.  In August 2018 she had pre op evaluation with a Myoview study that showed no ischemic and reduced EF. She had a right upper lobe lung nodule which had increased in size. She underwent resection in September 2018 with pathology showing adenocarcinoma stage 1. Negative margins. Echo performed in October showed EF 40% with normally functioning Mitral valve prosthesis.   She was admitted in March 2019 with CHF exacerbation. Responded to IV diuresis. Continued on other medical therapy. CT of the chest showed no PE. Discharge weight 168 lbs.   On 02/04/18 she underwent LE angiogram for left leg claudication. She had PTA/stenting of the left external iliac artery by Dr. Fortunato Curling. She was also noted to have an 80% right renal artery stenosis. This was a difficult procedure because she had severe back spasms. Her potassium was also low.   Carotid dopplers on 04/24/18 showed no change in bilateral moderate carotid disease.   On follow up today she states she has not been feeling well. She notes a cold in December. Still has some cough. She notes more palpitations. She is sleeping poorly. Still smoking. Last week she had a spell. She was at her brother's house talking on a sofa. She had hurting in her chest. The next thing she knew she was on her bed at home fully clothed. She states her brother told her she had a panic attack. She threw up in the car. She doesn't remember anything at that time.       Past Medical History:  Diagnosis Date  . Anemia   . Bronchogenic lung cancer, right ([place]) 11/29/2016  . CAD (coronary artery disease)    a. s/p LIMA-LAD in 11/2014  . Carotid artery occlusion   . CHF (congestive heart failure) ([place])   . Complication of anesthesia    slow to awaken x 1 maybe 2001  . COPD (chronic obstructive pulmonary disease) ([place])   . GERD (gastroesophageal reflux disease)    "sometimes" takes Copywriter, advertising or drinks gingerale   . Headache(784.0)   . History of kidney stones   . Hyperlipidemia   . Hypertension   . Leg pain   . Peripheral vascular disease ([place])   . Renal vascular disease 10/26/2014   bilateral stents placed   . Severe mitral regurgitation    a. s/p MVR in 11/2014 with a pericardial tissue valve  . Shortness of breath dyspnea   . Tobacco abuse     Past Surgical History:  Procedure Laterality Date  . ABDOMINAL HYSTERECTOMY    . ANGIOPLASTY / STENTING ILIAC  2010   right external iliac by Dr. Irish Lack  . CARDIAC CATHETERIZATION  11/13/11   Left Heart Cath. with Coronary Angiogram  . CARDIAC CATHETERIZATION N/A 08/24/2014   Procedure: Left Heart Cath and Coronary Angiography;  Surgeon: Wellington Hampshire, MD;  Location: [facility];  Service: Cardiovascular;  Laterality: N/A;  . CARDIAC CATHETERIZATION N/A  10/26/2014   Procedure: Right/Left Heart Cath and Coronary Angiography;  Surgeon: Dawne Casali M Martinique, MD; oLAD 70%, mLAD 70% ISR, D2 30%, OFC 30%, RCA 20%, EF nl, low R heart pressures after diuresis, severe MR  . CAROTID ENDARTERECTOMY  11/21/2007   left  . COLONOSCOPY W/ POLYPECTOMY    . CORONARY ANGIOPLASTY WITH STENT PLACEMENT  6/09   LAD 2.5x12 Promus  . CORONARY ARTERY BYPASS GRAFT N/A 12/05/2014   Procedure: CORONARY ARTERY BYPASS GRAFTING (CABG);  Surgeon: [name], MD;  Location: [facility];  Service: Open Heart Surgery;  Laterality: N/A;  Times 1 using left internal mammary artery to LAD  . FEMORAL-POPLITEAL BYPASS GRAFT  10/12    left Dr. Kellie Simmering  . LOWER EXTREMITY ANGIOGRAPHY N/A 02/04/2018   Procedure: LOWER EXTREMITY ANGIOGRAPHY;  Surgeon: Marty Heck, MD;  Location: [facility];  Service: Cardiovascular;  Laterality: N/A;  . MITRAL VALVE REPAIR  2016  . MITRAL VALVE REPLACEMENT N/A 12/05/2014   Procedure: MITRAL VALVE (MV) REPLACEMENT;  Surgeon: [name], MD;  Location: [facility];  Service: Open Heart Surgery;  Laterality: N/A;  Closure left atrial appendage  . PERIPHERAL VASCULAR INTERVENTION Left 02/04/2018   Procedure: PERIPHERAL VASCULAR INTERVENTION;  Surgeon: Marty Heck, MD;  Location: [facility];  Service: Cardiovascular;  Laterality: Left;  external iliac  . RENAL ARTERY STENT Bilateral   . TEE WITHOUT CARDIOVERSION N/A 10/24/2014   Procedure: TRANSESOPHAGEAL ECHOCARDIOGRAM (TEE);  Surgeon: Thayer Headings, MD;  Location: [facility];  Service: Cardiovascular;  Laterality: N/A;  . TEE WITHOUT CARDIOVERSION N/A 12/05/2014   Procedure: TRANSESOPHAGEAL ECHOCARDIOGRAM (TEE);  Surgeon: [name], MD;  Location: [facility];  Service: Open Heart Surgery;  Laterality: N/A;  . VIDEO ASSISTED THORACOSCOPY (VATS)/WEDGE RESECTION Right 11/29/2016   Procedure: VIDEO ASSISTED THORACOSCOPY (VATS)/ RUL WEDGE RESECTION OF LESION/ NODE SAMPLING;  Surgeon: [name], MD;  Location: [facility];  Service: Thoracic;  Laterality: Right;  Marland Kitchen VIDEO BRONCHOSCOPY N/A 11/29/2016   Procedure: VIDEO BRONCHOSCOPY;  Surgeon: [name], MD;  Location: [facility];  Service: Thoracic;  Laterality: N/A;    Current Medications: Outpatient Medications Prior to Visit  Medication Sig Dispense Refill  . albuterol (PROVENTIL HFA;VENTOLIN HFA) 108 (90 Base) MCG/ACT inhaler Inhale 2 puffs into the lungs every 6 (six) hours as needed for wheezing or shortness of breath. 1 Inhaler 0  . aspirin EC 81 MG tablet Take 1 tablet (81 mg total) by mouth daily. 90 tablet 1  . atorvastatin (LIPITOR) 80 MG tablet Take  1 tablet (80 mg total) by mouth daily. (Patient taking differently: Take 80 mg by mouth 2 (two) times a week. ) 30 tablet 11  . clopidogrel (PLAVIX) 75 MG tablet Take 1 tablet (75 mg total) by mouth daily. 30 tablet 11  . cyclobenzaprine (FLEXERIL) 10 MG tablet Take 1 tablet (10 mg total) by mouth 3 (three) times daily as needed. for muscle spams (Patient taking differently: Take 10 mg by mouth 3 (three) times daily as needed for muscle spasms. ) 60 tablet 2  . diclofenac sodium (VOLTAREN) 1 % GEL Apply 4 g topically 4 (four) times daily. (Patient taking differently: Apply 4 g topically 4 (four) times daily as needed (for back pain.). ) 100 g 0  . ezetimibe (ZETIA) 10 MG tablet Take 1 tablet (10 mg total) by mouth daily. 90 tablet 3  . furosemide (LASIX) 40 MG tablet Take 0.5 tablets (20 mg total) by mouth  daily. 30 tablet 6  . gabapentin (NEURONTIN) 100 MG capsule Take 2 capsules (200 mg total) by mouth 3 (three) times daily. 120 capsule 3  . isosorbide-hydrALAZINE (BIDIL) 20-37.5 MG tablet Take 1 tablet by mouth 3 (three) times daily. 90 tablet 11  . metoprolol succinate (TOPROL-XL) 25 MG 24 hr tablet Take 1 tablet (25 mg total) by mouth daily. 90 tablet 3  . nitroGLYCERIN (NITROSTAT) 0.4 MG SL tablet Place 1 tablet (0.4 mg total) under the tongue every 5 (five) minutes x 3 doses as needed for chest pain. 25 tablet 12  . potassium chloride SA (K-DUR,KLOR-CON) 20 MEQ tablet Take 2 tablets ( 40 meq ) twice a day (Patient taking differently: Take 20 mEq by mouth 3 (three) times a week. ) 120 tablet 6  . traMADol (ULTRAM) 50 MG tablet TAKE 1 TABLET BY MOUTH EVERY 6 HOURS AS NEEDED FOR PAIN 60 tablet 0  . traZODone (DESYREL) 100 MG tablet Take 100 mg by mouth at bedtime as needed for sleep (Patient taking differently: Take 100 mg by mouth at bedtime. ) 30 tablet 2   No facility-administered medications prior to visit.      Allergies:   Lisinopril; Chantix [varenicline]; and Penicillins   Social  History   Socioeconomic History  . Marital status: Divorced    Spouse name: Not on file  . Number of children: 4  . Years of education: Not on file  . Highest education level: Not on file  Occupational History  . Occupation: retired  Scientific laboratory technician  . Financial resource strain: Not on file  . Food insecurity:    Worry: Not on file    Inability: Not on file  . Transportation needs:    Medical: Not on file    Non-medical: Not on file  Tobacco Use  . Smoking status: Current Every Day Smoker    Packs/day: 0.50    Years: 35.00    Pack years: 17.50    Types: Cigarettes  . Smokeless tobacco: Never Used  Substance and Sexual Activity  . Alcohol use: Yes    Alcohol/week: 0.0 standard drinks    Comment: occasion  . Drug use: No  . Sexual activity: Never  Lifestyle  . Physical activity:    Days per week: Not on file    Minutes per session: Not on file  . Stress: Not on file  Relationships  . Social connections:    Talks on phone: Not on file    Gets together: Not on file    Attends religious service: Not on file    Active member of club or organization: Not on file    Attends meetings of clubs or organizations: Not on file    Relationship status: Not on file  Other Topics Concern  . Not on file  Social History Narrative  . Not on file     Family History:  The patient's family history includes ALS in her brother; Cancer in her mother; Heart disease in her father; Hypertension in her brother, father, and mother; Kidney disease in her father; Prostate cancer in her father; Stroke in her paternal aunt.   Review of Systems:   Please see the history of present illness.     All other systems reviewed and are otherwise negative except as noted above.   Physical Exam:    VS:  BP (!) 214/70   Pulse 96   Ht 6' 1.5" (1.867 m)   Wt 147 lb 9.6 oz (67 kg)  BMI 19.21 kg/m    GENERAL:  Well appearing, thin BF in NAD HEENT:  PERRL, EOMI, sclera are clear. Oropharynx is  clear. NECK:  No jugular venous distention, carotid upstroke brisk and symmetric, no bruits, no thyromegaly or adenopathy LUNGS:  Clear to auscultation bilaterally CHEST:  Unremarkable HEART:  RRR,  PMI not displaced or sustained,S1 and S2 within normal limits, no S3, no S4: no clicks, no rubs, soft systolic murmur RUSB ABD:  Soft, nontender. BS +, no masses or bruits. No hepatomegaly, no splenomegaly EXT:  2 + femoral pulse, poor pedal pulses. no edema, no cyanosis no clubbing SKIN:  Warm and dry.  No rashes NEURO:  Alert and oriented x 3. Cranial nerves II through XII intact. PSYCH:  Cognitively intact      Wt Readings from Last 3 Encounters:  04/28/18 147 lb 9.6 oz (67 kg)  03/16/18 152 lb 3.2 oz (69 kg)  03/04/18 151 lb (68.5 kg)     Studies/Labs Reviewed:   EKG:  EKG is  ordered today.  NSR with PACs and PVCs. LVH, old septal infarct. I have personally reviewed and interpreted this study.  Recent Labs: 06/05/2017: Hemoglobin 12.9; Platelets 210 06/13/2017: ALT 21 08/28/2017: BNP 395.5 02/12/2018: BUN 6; Creatinine, Ser 1.08; Potassium 3.2; Sodium 145   Lipid Panel    Component Value Date/Time   CHOL 156 06/13/2017 1104   TRIG 98 06/13/2017 1104   HDL 45 06/13/2017 1104   CHOLHDL 4.8 (H) 11/18/2016 0844   CHOLHDL 5.6 (H) 01/15/2016 1117   VLDL 24 01/15/2016 1117   LDLCALC 91 06/13/2017 1104   LDLDIRECT 185 (A) 09/09/2011 0820    Additional studies/ records that were reviewed today include:   Myoview 11/20/16: Study Highlights    Nuclear stress EF: 31%. The left ventricular ejection fraction is moderately decreased (30-44%).  Defect 1: There is a medium defect of moderate severity present in the apical anterior, apical septal, apical inferior and apex location.  Findings consistent with prior apical myocardial infarction.  This is a high risk study based on severely reduced LV function . There is no evidence of ischemia   Echo 01/13/17: Study Conclusions  -  Left ventricle: The cavity size was normal. Wall thickness was   normal. Systolic function was mildly to moderately reduced. The   estimated ejection fraction was 40%. Diffuse hypokinesis. There   was a reduced contribution of atrial contraction to ventricular   filling, due to increased ventricular diastolic pressure or   atrial contractile dysfunction. Doppler parameters are consistent   with high ventricular filling pressure. - Aortic valve: There was mild regurgitation. - Mitral valve: A bioprosthesis was present. with normal function.   Mean gradient (D): 2 mm Hg. Peak gradient (D): 10 mm Hg. - Left atrium: The atrium was moderately dilated. - Pulmonary arteries: PA peak pressure: 38 mm Hg (S).   Assessment:    1. Palpitations   2. CAD S/P LAD DES 2009 with 70% ISR 08/24/14   3. Chronic systolic CHF (congestive heart failure) ([place])   4. S/P MVR (mitral valve replacement)   5. PAD (peripheral artery disease) ([place])   6. Tobacco abuse      Plan:   In order of problems listed above:  1. CAD - s/p LIMA-LAD in 11/2014.  - no real anginal symptoms. -  Lexiscan Myoview in August 2018 showed no ischemia but reduced EF   - continue ASA, statin, and BB therapy.   2. S/p MVR - performed  in in 11/2014 with a pericardial tissue valve. Echo in August 2018 showed a stable prosthesis with no obvious MR.  - No significant murmur appreciated on examination today.   3. Chronic systolic CHF EF 44%. In review of records she had EF 45-50% post MVR. "normal" EF prior to surgery but I suspect there was latent LV dysfunction in setting of severe MR. Follow up Echo in October 2018 showed EF 40%.  She has stable class 1-2 symptoms. She is not a candidate for ACEi or ARB due to history of angioedema on lisinopril. Continue Bidil 20/37.5 mg tid. and metoprolol. continue current lasix dose. Will check BNP today.   4. HTN - BP is well controlled.  5. HLD - Lipid Panel shows LDL 91 since more  compliant with lipitor. Added Zetia. Will check lipid panel today.   6. PVD -  s/p L CEA and left fem pop BPG. Bilateral carotid bruits. Carotid dopplers in January showed moderate bilateral disease. No AAA. Recent left external iliac stenting. RAS s/p bilateral stenting- now 80% on right and 50% on the left.  - continue ASA and statin therapy.   7. Tobacco Use - still smoking 0.5 ppd. Encouraged complete smoking cessation  8. Adenocarcinoma lung. S/p resection. Stage 1. No recurrence by CT.  9. Palpitations: PVCs and PACs noted on Ecg. Will check potassium. If normal we will place an event monitor.  10. Spell of altered mental state. Difficult to get clear history since patient has no clear recollection of events. No recurrence. Will await results of lab work. No focal neurologic findings.     Signed, Tashawn Laswell Martinique, MD  04/28/2018 3:40 [facility] [place], [place], [place]  [address] Phone: [phone]; Fax: [phone]  [address], [place], [place] [address] Phone: [phone]

## 2018-04-27 ENCOUNTER — Telehealth: Payer: Self-pay

## 2018-04-27 NOTE — Telephone Encounter (Signed)
Pt called nurse line stating [facility] is not carrying tramadol at this time and to call in a new rx to [facility] on [address].   I called [facility] to clarify and canceled the prescription.   [facility] script sent to [facility] on [address] as requested.

## 2018-04-27 NOTE — Telephone Encounter (Signed)
Spoke to patient Dr.Jordan has had a cancellation for this afternoon.Calling to see if you want to come in this afternoon instead of tomorrow.Stated she does not have any transportation.Stated she has not blacked out or had any more chest pain.Stated she has been having frequent palpitations.Advised to keep appointment as planned with Dr.Jordan 2/4 at 3:20 pm.

## 2018-04-28 ENCOUNTER — Encounter: Payer: Self-pay | Admitting: Cardiology

## 2018-04-28 ENCOUNTER — Ambulatory Visit (INDEPENDENT_AMBULATORY_CARE_PROVIDER_SITE_OTHER): Payer: Medicare Other | Admitting: Cardiology

## 2018-04-28 VITALS — BP 214/70 | HR 96 | Ht 73.5 in | Wt 147.6 lb

## 2018-04-28 DIAGNOSIS — I2510 Atherosclerotic heart disease of native coronary artery without angina pectoris: Secondary | ICD-10-CM | POA: Diagnosis not present

## 2018-04-28 DIAGNOSIS — Z952 Presence of prosthetic heart valve: Secondary | ICD-10-CM | POA: Diagnosis not present

## 2018-04-28 DIAGNOSIS — I5022 Chronic systolic (congestive) heart failure: Secondary | ICD-10-CM | POA: Diagnosis not present

## 2018-04-28 DIAGNOSIS — R002 Palpitations: Secondary | ICD-10-CM

## 2018-04-28 DIAGNOSIS — I739 Peripheral vascular disease, unspecified: Secondary | ICD-10-CM | POA: Diagnosis not present

## 2018-04-28 DIAGNOSIS — I6523 Occlusion and stenosis of bilateral carotid arteries: Secondary | ICD-10-CM | POA: Diagnosis not present

## 2018-04-28 DIAGNOSIS — Z720 Tobacco use: Secondary | ICD-10-CM | POA: Diagnosis not present

## 2018-04-28 DIAGNOSIS — Z9861 Coronary angioplasty status: Secondary | ICD-10-CM

## 2018-04-29 LAB — COMPREHENSIVE METABOLIC PANEL
ALK PHOS: 123 IU/L — AB (ref 39–117)
ALT: 8 IU/L (ref 0–32)
AST: 9 IU/L (ref 0–40)
Albumin/Globulin Ratio: 1.5 (ref 1.2–2.2)
Albumin: 4.3 g/dL (ref 3.8–4.8)
BUN/Creatinine Ratio: 14 (ref 12–28)
BUN: 13 mg/dL (ref 8–27)
Bilirubin Total: 0.3 mg/dL (ref 0.0–1.2)
CALCIUM: 9.9 mg/dL (ref 8.7–10.3)
CO2: 24 mmol/L (ref 20–29)
Chloride: 103 mmol/L (ref 96–106)
Creatinine, Ser: 0.93 mg/dL (ref 0.57–1.00)
GFR calc Af Amer: 75 mL/min/{1.73_m2} (ref >59)
GFR, EST NON AFRICAN AMERICAN: 65 mL/min/{1.73_m2} (ref >59)
GLUCOSE: 75 mg/dL (ref 65–99)
Globulin, Total: 2.9 g/dL (ref 1.5–4.5)
Potassium: 3.4 mmol/L — ABNORMAL LOW (ref 3.5–5.2)
Sodium: 142 mmol/L (ref 134–144)
Total Protein: 7.2 g/dL (ref 6.0–8.5)

## 2018-04-29 LAB — CBC WITH DIFFERENTIAL/PLATELET
BASOS: 1 %
Basophils Absolute: 0.1 10*3/uL (ref 0.0–0.2)
EOS (ABSOLUTE): 0.2 10*3/uL (ref 0.0–0.4)
Eos: 2 %
HEMOGLOBIN: 13 g/dL (ref 11.1–15.9)
Hematocrit: 39.3 % (ref 34.0–46.6)
IMMATURE GRANS (ABS): 0 10*3/uL (ref 0.0–0.1)
IMMATURE GRANULOCYTES: 0 %
LYMPHS: 30 %
Lymphocytes Absolute: 4 10*3/uL — ABNORMAL HIGH (ref 0.7–3.1)
MCH: 26 pg — AB (ref 26.6–33.0)
MCHC: 33.1 g/dL (ref 31.5–35.7)
MCV: 79 fL (ref 79–97)
MONOCYTES: 7 %
Monocytes Absolute: 0.9 10*3/uL (ref 0.1–0.9)
NEUTROS PCT: 60 %
Neutrophils Absolute: 8.2 10*3/uL — ABNORMAL HIGH (ref 1.4–7.0)
PLATELETS: 194 10*3/uL (ref 150–450)
RBC: 5 x10E6/uL (ref 3.77–5.28)
RDW: 16.7 % — ABNORMAL HIGH (ref 11.7–15.4)
WBC: 13.5 10*3/uL — AB (ref 3.4–10.8)

## 2018-04-29 LAB — LIPID PANEL
Chol/HDL Ratio: 3.5 ratio (ref 0.0–4.4)
Cholesterol, Total: 168 mg/dL (ref 100–199)
HDL: 48 mg/dL (ref >39)
LDL Calculated: 96 mg/dL (ref 0–99)
Triglycerides: 119 mg/dL (ref 0–149)
VLDL CHOLESTEROL CAL: 24 mg/dL (ref 5–40)

## 2018-04-29 LAB — BRAIN NATRIURETIC PEPTIDE: BNP: 330.4 pg/mL — AB (ref 0.0–100.0)

## 2018-04-30 ENCOUNTER — Other Ambulatory Visit: Payer: Self-pay | Admitting: Family Medicine

## 2018-04-30 DIAGNOSIS — M545 Low back pain: Principal | ICD-10-CM

## 2018-04-30 DIAGNOSIS — G8929 Other chronic pain: Secondary | ICD-10-CM

## 2018-05-01 ENCOUNTER — Other Ambulatory Visit: Payer: Self-pay

## 2018-05-01 ENCOUNTER — Telehealth: Payer: Self-pay | Admitting: Cardiology

## 2018-05-01 DIAGNOSIS — R002 Palpitations: Secondary | ICD-10-CM

## 2018-05-01 MED ORDER — ATORVASTATIN CALCIUM 80 MG PO TABS: 80.0000 mg | ORAL_TABLET | Freq: Every day | ORAL | 6 refills | Status: DC

## 2018-05-01 NOTE — Telephone Encounter (Signed)
Returned call to patient no answer.LMTC. 

## 2018-05-01 NOTE — Telephone Encounter (Signed)
Patient returned your call.

## 2018-05-01 NOTE — Telephone Encounter (Signed)
New message   Patient is returning call for lab results.

## 2018-05-04 NOTE — Telephone Encounter (Signed)
Returned call to patient 05/01/18 lab results given.

## 2018-05-06 ENCOUNTER — Ambulatory Visit (INDEPENDENT_AMBULATORY_CARE_PROVIDER_SITE_OTHER): Payer: Medicare Other

## 2018-05-06 DIAGNOSIS — R002 Palpitations: Secondary | ICD-10-CM

## 2018-05-08 ENCOUNTER — Telehealth: Payer: Self-pay | Admitting: *Deleted

## 2018-05-08 NOTE — Telephone Encounter (Signed)
Received strips from monitor company showing 7 beat run of vtach 05-07-2018 @ 9 pm. Spoke with pt, she reports this usually happens when she first lays down at night. It will last until she gets in her "sleeping position" and then does not bother her anymore. She felt fine during the palpitations and she feels fine today. Results shown to the DOD, dr harding and placed for review by dr Martinique.

## 2018-05-25 ENCOUNTER — Other Ambulatory Visit: Payer: Self-pay | Admitting: Family Medicine

## 2018-05-25 DIAGNOSIS — G8929 Other chronic pain: Secondary | ICD-10-CM

## 2018-05-25 DIAGNOSIS — M545 Low back pain, unspecified: Secondary | ICD-10-CM

## 2018-05-26 ENCOUNTER — Other Ambulatory Visit: Payer: Self-pay | Admitting: Family Medicine

## 2018-05-26 DIAGNOSIS — G8918 Other acute postprocedural pain: Secondary | ICD-10-CM

## 2018-06-01 ENCOUNTER — Other Ambulatory Visit: Payer: Self-pay

## 2018-06-01 DIAGNOSIS — I739 Peripheral vascular disease, unspecified: Secondary | ICD-10-CM

## 2018-06-05 ENCOUNTER — Ambulatory Visit (INDEPENDENT_AMBULATORY_CARE_PROVIDER_SITE_OTHER): Payer: Medicare Other | Admitting: Family

## 2018-06-05 ENCOUNTER — Other Ambulatory Visit: Payer: Self-pay

## 2018-06-05 ENCOUNTER — Ambulatory Visit (HOSPITAL_COMMUNITY)
Admission: RE | Admit: 2018-06-05 | Discharge: 2018-06-05 | Disposition: A | Discharge status: Home / Self Care | Payer: Medicare Other | Source: Ambulatory Visit | Attending: Vascular Surgery | Admitting: Vascular Surgery

## 2018-06-05 ENCOUNTER — Ambulatory Visit (INDEPENDENT_AMBULATORY_CARE_PROVIDER_SITE_OTHER)
Admission: RE | Admit: 2018-06-05 | Discharge: 2018-06-05 | Disposition: A | Discharge status: Home / Self Care | Payer: Medicare Other | Source: Ambulatory Visit | Attending: Vascular Surgery | Admitting: Vascular Surgery

## 2018-06-05 ENCOUNTER — Encounter: Payer: Self-pay | Admitting: Family

## 2018-06-05 VITALS — BP 143/82 | HR 72 | Temp 97.4°F | Resp 14 | Ht 73.0 in | Wt 151.0 lb

## 2018-06-05 DIAGNOSIS — F17200 Nicotine dependence, unspecified, uncomplicated: Secondary | ICD-10-CM | POA: Diagnosis not present

## 2018-06-05 DIAGNOSIS — I739 Peripheral vascular disease, unspecified: Secondary | ICD-10-CM

## 2018-06-05 DIAGNOSIS — Z95828 Presence of other vascular implants and grafts: Secondary | ICD-10-CM | POA: Diagnosis not present

## 2018-06-05 DIAGNOSIS — I779 Disorder of arteries and arterioles, unspecified: Secondary | ICD-10-CM

## 2018-06-05 NOTE — Patient Instructions (Addendum)
Steps to Quit Smoking    Smoking tobacco can be bad for your health. It can also affect almost every organ in your body. Smoking puts you and people around you at risk for many serious long-lasting (chronic) diseases. Quitting smoking is hard, but it is one of the best things that you can do for your health. It is never too late to quit.  What are the benefits of quitting smoking?  When you quit smoking, you lower your risk for getting serious diseases and conditions. They can include:  · Lung cancer or lung disease.  · Heart disease.  · Stroke.  · Heart attack.  · Not being able to have children (infertility).  · Weak bones (osteoporosis) and broken bones (fractures).  If you have coughing, wheezing, and shortness of breath, those symptoms may get better when you quit. You may also get sick less often. If you are pregnant, quitting smoking can help to lower your chances of having a baby of low birth weight.  What can I do to help me quit smoking?  Talk with your doctor about what can help you quit smoking. Some things you can do (strategies) include:  · Quitting smoking totally, instead of slowly cutting back how much you smoke over a period of time.  · Going to in-person counseling. You are more likely to quit if you go to many counseling sessions.  · Using resources and support systems, such as:  ? Online chats with a counselor.  ? Phone quitlines.  ? Printed self-help materials.  ? Support groups or group counseling.  ? Text messaging programs.  ? Mobile phone apps or applications.  · Taking medicines. Some of these medicines may have nicotine in them. If you are pregnant or breastfeeding, do not take any medicines to quit smoking unless your doctor says it is okay. Talk with your doctor about counseling or other things that can help you.  Talk with your doctor about using more than one strategy at the same time, such as taking medicines while you are also going to in-person counseling. This can help make  quitting easier.  What things can I do to make it easier to quit?  Quitting smoking might feel very hard at first, but there is a lot that you can do to make it easier. Take these steps:  · Talk to your family and friends. Ask them to support and encourage you.  · Call phone quitlines, reach out to support groups, or work with a counselor.  · Ask people who smoke to not smoke around you.  · Avoid places that make you want (trigger) to smoke, such as:  ? Bars.  ? Parties.  ? Smoke-break areas at work.  · Spend time with people who do not smoke.  · Lower the stress in your life. Stress can make you want to smoke. Try these things to help your stress:  ? Getting regular exercise.  ? Deep-breathing exercises.  ? Yoga.  ? Meditating.  ? Doing a body scan. To do this, close your eyes, focus on one area of your body at a time from head to toe, and notice which parts of your body are tense. Try to relax the muscles in those areas.  · Download or buy apps on your mobile phone or tablet that can help you stick to your quit plan. There are many free apps, such as QuitGuide from the CDC (Centers for Disease Control and Prevention). You can find more   Document Reviewed: 07/26/2014 Elsevier Interactive Patient Education  2019 [employer].     Peripheral Vascular Disease  Peripheral vascular disease (PVD) is a disease of the blood vessels that are not part of your heart and brain. A simple term for PVD is poor circulation. In most cases, PVD narrows the blood vessels that carry blood from your heart to the rest of your body. This can reduce the supply of blood to your arms, legs, and internal organs, like your stomach or kidneys. However, PVD most  often affects a person's lower legs and feet. Without treatment, PVD tends to get worse. PVD can also lead to acute ischemic limb. This is when an arm or leg suddenly cannot get enough blood. This is a medical emergency. Follow these instructions at home: Lifestyle  Do not use any products that contain nicotine or tobacco, such as cigarettes and e-cigarettes. If you need help quitting, ask your doctor.  Lose weight if you are overweight. Or, stay at a healthy weight as told by your doctor.  Eat a diet that is low in fat and cholesterol. If you need help, ask your doctor.  Exercise regularly. Ask your doctor for activities that are right for you. General instructions  Take over-the-counter and prescription medicines only as told by your doctor.  Take good care of your feet: ? Wear comfortable shoes that fit well. ? Check your feet often for any cuts or sores.  Keep all follow-up visits as told by your doctor This is important. Contact a doctor if:  You have cramps in your legs when you walk.  You have leg pain when you are at rest.  You have coldness in a leg or foot.  Your skin changes.  You are unable to get or have an erection (erectile dysfunction).  You have cuts or sores on your feet that do not heal. Get help right away if:  Your arm or leg turns cold, numb, and blue.  Your arms or legs become red, warm, swollen, painful, or numb.  You have chest pain.  You have trouble breathing.  You suddenly have weakness in your face, arm, or leg.  You become very confused or you cannot speak.  You suddenly have a very bad headache.  You suddenly cannot see. Summary  Peripheral vascular disease (PVD) is a disease of the blood vessels.  A simple term for PVD is poor circulation. Without treatment, PVD tends to get worse.  Treatment may include exercise, low fat and low cholesterol diet, and quitting smoking. This information is not intended to replace advice given to  you by your health care provider. Make sure you discuss any questions you have with your health care provider. Document Released: 06/05/2009 Document Revised: 04/18/2016 Document Reviewed: 04/18/2016 Elsevier Interactive Patient Education  2019 [employer].     Before your next abdominal ultrasound:  Avoid gas forming foods and beverages the day before the test.   Take two Extra-Strength Gas-X capsules at bedtime the night before the test. Take another two Extra-Strength Gas-X capsules in the middle of the night if you get up to the restroom, if not, first thing in the morning with water.  Do not chew gum.

## 2018-06-05 NOTE — Progress Notes (Signed)
[facility] [place]   CC: Follow up peripheral artery occlusive disease  History of Present Illness Leah [name] is a 68 y.o. female who is s/p L EIA stenting by Dr. Carlis Abbott on 02/04/18.  This was indicated due to increasing claudication symptoms of left lower extremity as well as a perceived threatened left femoral to popliteal bypass.    She states that she can walk as far as she wants with no claudication type sx's.  She has some remaining mild numbness at the sole of her left foot. She denies any problems in her right LE.  She denies any rest pain or tissue loss.    She denies any known hx of stroke or TIA.   It should also be noted that patient has bilateral renal artery stents that were seen to have in-stent stenosis on aortogram.  BP today is 143/82. Patient states she is well controlled on her current antihypertensive regimen.  M. Eveland PA-C last evaluated pt on 03-04-18. At that time left lower extremity claudication sx's had resolved. There was a velocity of 258 cm/s in proximal iliac stent however patient had a palpable left PT Ambulation encouraged. Smoking cessation encouraged.  Pt advised to continue aspirin, Plavix, and statin regimen Return in 3 months to recheck aortoiliac duplex and ABIs.  She sees Dr. Martinique for a murmur and mild arrhythmia.  She had a carotid duplex in January 2020 requested by Dr. Martinique; pt also has bilateral carotid bruits. See below for results.    Diabetic: No Tobacco use: smoker  (decreased to 5 cigs/day ppd, started about age 19 yrs)  Pt meds include: Statin :Yes Betablocker: Yes ASA: Yes Other anticoagulants/antiplatelets: Plavix  Past Medical History:  Diagnosis Date  . Anemia   . Bronchogenic lung cancer, right ([place]) 11/29/2016  . CAD (coronary artery disease)    a. s/p LIMA-LAD in 11/2014  . Carotid artery occlusion   . CHF (congestive heart failure) ([place])   . Complication of anesthesia    slow to  awaken x 1 maybe 2001  . COPD (chronic obstructive pulmonary disease) ([place])   . GERD (gastroesophageal reflux disease)    "sometimes" takes Copywriter, advertising or drinks gingerale   . Headache(784.0)   . History of kidney stones   . Hyperlipidemia   . Hypertension   . Leg pain   . Peripheral vascular disease ([place])   . Renal vascular disease 10/26/2014   bilateral stents placed   . Severe mitral regurgitation    a. s/p MVR in 11/2014 with a pericardial tissue valve  . Shortness of breath dyspnea   . Tobacco abuse     Social History Social History   Tobacco Use  . Smoking status: Current Every Day Smoker    Packs/day: 0.50    Years: 35.00    Pack years: 17.50    Types: Cigarettes  . Smokeless tobacco: Never Used  Substance Use Topics  . Alcohol use: Yes    Alcohol/week: 0.0 standard drinks    Comment: occasion  . Drug use: No    Family History Family History  Problem Relation Age of Onset  . Cancer Mother        BRAIN AND LUNG  . Hypertension Mother   . Hypertension Father   . Heart disease Father   . Prostate cancer Father   . Kidney disease Father   . Hypertension Brother   . ALS Brother   . Stroke Paternal Aunt  great aunt  . Heart attack Neg Hx   . Colon cancer Neg Hx   . Stomach cancer Neg Hx   . Rectal cancer Neg Hx   . Esophageal cancer Neg Hx   . Liver cancer Neg Hx     Past Surgical History:  Procedure Laterality Date  . ABDOMINAL HYSTERECTOMY    . ANGIOPLASTY / STENTING ILIAC  2010   right external iliac by Dr. Irish Lack  . CARDIAC CATHETERIZATION  11/13/11   Left Heart Cath. with Coronary Angiogram  . CARDIAC CATHETERIZATION N/A 08/24/2014   Procedure: Left Heart Cath and Coronary Angiography;  Surgeon: Wellington Hampshire, MD;  Location: [facility];  Service: Cardiovascular;  Laterality: N/A;  . CARDIAC CATHETERIZATION N/A 10/26/2014   Procedure: Right/Left Heart Cath and Coronary Angiography;  Surgeon: Peter M Martinique, MD; oLAD 70%, mLAD 70%  ISR, D2 30%, OFC 30%, RCA 20%, EF nl, low R heart pressures after diuresis, severe MR  . CAROTID ENDARTERECTOMY  11/21/2007   left  . COLONOSCOPY W/ POLYPECTOMY    . CORONARY ANGIOPLASTY WITH STENT PLACEMENT  6/09   LAD 2.5x12 Prom[place]  . CORONARY ARTERY BYPASS GRAFT N/A 12/05/2014   Procedure: CORONARY ARTERY BYPASS GRAFTING (CABG);  Surgeon: [name], MD;  Location: [facility];  Service: Open Heart Surgery;  Laterality: N/A;  Times 1 using left internal mammary artery to LAD  . FEMORAL-POPLITEAL BYPASS GRAFT  10/12   left Dr. Kellie Simmering  . LOWER EXTREMITY ANGIOGRAPHY N/A 02/04/2018   Procedure: LOWER EXTREMITY ANGIOGRAPHY;  Surgeon: Marty Heck, MD;  Location: [facility];  Service: Cardiovascular;  Laterality: N/A;  . MITRAL VALVE REPAIR  2016  . MITRAL VALVE REPLACEMENT N/A 12/05/2014   Procedure: MITRAL VALVE (MV) REPLACEMENT;  Surgeon: [name], MD;  Location: [facility];  Service: Open Heart Surgery;  Laterality: N/A;  Closure left atrial appendage  . PERIPHERAL VASCULAR INTERVENTION Left 02/04/2018   Procedure: PERIPHERAL VASCULAR INTERVENTION;  Surgeon: Marty Heck, MD;  Location: [place] [facility];  Service: Cardiovascular;  Laterality: Left;  external iliac  . RENAL ARTERY STENT Bilateral   . TEE WITHOUT CARDIOVERSION N/A 10/24/2014   Procedure: TRANSESOPHAGEAL ECHOCARDIOGRAM (TEE);  Surgeon: Thayer Headings, MD;  Location: [place] [facility];  Service: Cardiovascular;  Laterality: N/A;  . TEE WITHOUT CARDIOVERSION N/A 12/05/2014   Procedure: TRANSESOPHAGEAL ECHOCARDIOGRAM (TEE);  Surgeon: [name], MD;  Location: [place];  Service: Open Heart Surgery;  Laterality: N/A;  . VIDEO ASSISTED THORACOSCOPY (VATS)/WEDGE RESECTION Right 11/29/2016   Procedure: VIDEO ASSISTED THORACOSCOPY (VATS)/ RUL WEDGE RESECTION OF LESION/ NODE SAMPLING;  Surgeon: [name], MD;  Location: [facility];  Service: Thoracic;  Laterality: Right;  Marland Kitchen VIDEO BRONCHOSCOPY N/A 11/29/2016    Procedure: VIDEO BRONCHOSCOPY;  Surgeon: [name], MD;  Location: [facility];  Service: Thoracic;  Laterality: N/A;    Allergies  Allergen Reactions  . Lisinopril Swelling    Angioedema 06/10/11  . Chantix [Varenicline] Other (See Comments)    Caused insomnia  . Penicillins Hives    Has patient had a PCN reaction causing immediate rash, facial/tongue/throat swelling, SOB or lightheadedness with hypotension: Yes Has patient had a PCN reaction causing severe rash involving mucus membranes or skin necrosis: No Has patient had a PCN reaction that required hospitalization: No Has patient had a PCN reaction occurring within the last 10 years: No If all of the above answers are "NO", then may proceed with Cephalosporin use.  Current Outpatient Medications  Medication Sig Dispense Refill  . albuterol (PROVENTIL HFA;VENTOLIN HFA) 108 (90 Base) MCG/ACT inhaler Inhale 2 puffs into the lungs every 6 (six) hours as needed for wheezing or shortness of breath. 1 Inhaler 0  . aspirin EC 81 MG tablet Take 1 tablet (81 mg total) by mouth daily. 90 tablet 1  . atorvastatin (LIPITOR) 80 MG tablet Take 1 tablet (80 mg total) by mouth daily. 30 tablet 6  . clopidogrel (PLAVIX) 75 MG tablet Take 1 tablet (75 mg total) by mouth daily. 30 tablet 11  . cyclobenzaprine (FLEXERIL) 10 MG tablet Take 1 tablet by mouth three times daily as needed for muscle spasm 60 tablet 0  . diclofenac sodium (VOLTAREN) 1 % GEL Apply 4 g topically 4 (four) times daily. (Patient taking differently: Apply 4 g topically 4 (four) times daily as needed (for back pain.). ) 100 g 0  . ezetimibe (ZETIA) 10 MG tablet Take 1 tablet (10 mg total) by mouth daily. 90 tablet 3  . furosemide (LASIX) 40 MG tablet Take 0.5 tablets (20 mg total) by mouth daily. 30 tablet 6  . gabapentin (NEURONTIN) 100 MG capsule TAKE 2 CAPSULES BY MOUTH THREE TIMES DAILY 120 capsule 0  . isosorbide-hydrALAZINE (BIDIL) 20-37.5 MG tablet Take 1 tablet by mouth  3 (three) times daily. 90 tablet 11  . metoprolol succinate (TOPROL-XL) 25 MG 24 hr tablet Take 1 tablet (25 mg total) by mouth daily. 90 tablet 3  . potassium chloride SA (K-DUR,KLOR-CON) 20 MEQ tablet Take 2 tablets ( 40 meq ) twice a day 90 tablet 3  . traMADol (ULTRAM) 50 MG tablet TAKE 1 TABLET BY MOUTH EVERY 6 HOURS AS NEEDED FOR PAIN 60 tablet 0  . traZODone (DESYREL) 100 MG tablet Take 100 mg by mouth at bedtime as needed for sleep (Patient taking differently: Take 100 mg by mouth at bedtime. ) 30 tablet 2  . nitroGLYCERIN (NITROSTAT) 0.4 MG SL tablet Place 1 tablet (0.4 mg total) under the tongue every 5 (five) minutes x 3 doses as needed for chest pain. (Patient not taking: Reported on 06/05/2018) 25 tablet 12   No current facility-administered medications for this visit.     ROS: See HPI for pertinent positives and negatives.   Physical Examination  Vitals:   06/05/18 1007  BP: (!) 143/82  Pulse: 72  Resp: 14  Temp: (!) 97.4 F (36.3 C)  TempSrc: Oral  SpO2: 100%  Weight: 151 lb (68.5 kg)  Height: 6\' 1"  (1.854 m)   Body mass index is 19.92 kg/m.  General: A&O x 3, WDWN, tall slim female. Gait: normal HENT: No gross abnormalities.  Eyes: PERRLA. Pulmonary: Respirations are non labored, CTAB, good air movement in all fields Cardiac: regular rhythm with occasional premature contactions, no detected murmur.         Carotid Bruits Right Left   Positive Positive   Radial pulses are 2+ palpable bilaterally   Adominal aortic pulse is 2-3+ palpable                         VASCULAR EXAM: Extremities without ischemic changes, without Gangrene; without open wounds.  LE Pulses Right Left       FEMORAL  2+ palpable  2+ palpable        POPLITEAL  2+ palpable   1+ palpable       POSTERIOR TIBIAL  faintly palpable   not palpable        DORSALIS PEDIS      ANTERIOR  TIBIAL 1+ palpable  3+ palpable    Abdomen: soft, NT, no palpable masses. Skin: no rashes, no cellulitis, no ulcers noted. Musculoskeletal: no muscle wasting or atrophy.  Neurologic: A&O X 3; appropriate affect, Sensation is normal; MOTOR FUNCTION:  moving all extremities equally, motor strength 5/5 throughout. Speech is fluent/normal. CN 2-12 intact. Psychiatric: Thought content is normal, mood appropriate for clinical situation.     ASSESSMENT: Leah [name] is a 68 y.o. female who is s/p L EIA stenting by Dr. Carlis Abbott on 02/04/18.  This was indicated due to increasing claudication symptoms of left lower extremity as well as a perceived threatened left femoral to popliteal bypass.   It should also be noted that patient has bilateral renal artery stents that were seen to have in-stent stenosis on aortogram.  BP today is 143/82. Patient states she is well controlled on her current antihypertensive regimen.   Very palpable abdominal aortic pulse in this thin female. Dr. Donzetta Matters reviewed angiography images from November 2019, he did not see a AAA.  Increased stenosis distal to left iliac stent, return in 3 months with bilateral aortoiliac duplex and ABI's, see Dr. Carlis Abbott.   Carotid artery stenosis: no hx os stroke or TIA;  Carotid duplex on1-31-20 shows 40-59% bilateral ICA stenosis.   DATA  (06-05-18): Abdominal Aorta Findings: +-------------+-------+----------+----------+--------+--------+--------+ Location     AP (cm)Trans (cm)PSV (cm/s)WaveformThrombusComments +-------------+-------+----------+----------+--------+--------+--------+ Proximal     2.57   2.66 cm                                       +-------------+-------+----------+----------+--------+--------+--------+ Distal                        61                                 +-------------+-------+----------+----------+--------+--------+--------+ RT EIA Distal245.0                      biphasic                  +-------------+-------+----------+----------+--------+--------+--------+    Right Stent(s): +---------------+--------+---------------+--------+--------+ Common iliac   PSV cm/sStenosis       WaveformComments +---------------+--------+---------------+--------+--------+ Prox to Stent  127                    biphasic         +---------------+--------+---------------+--------+--------+ Proximal Stent 127                    biphasic         +---------------+--------+---------------+--------+--------+ Mid Stent      113                    biphasic         +---------------+--------+---------------+--------+--------+ Distal Stent   239     50-99% stenosisbiphasic         +---------------+--------+---------------+--------+--------+ Distal to Stent206  biphasic         +---------------+--------+---------------+--------+--------+   Left Stent(s): +---------------+--------+--------+--------+--------+ External iliac PSV cm/sStenosisWaveformComments +---------------+--------+--------+--------+--------+ Prox to Stent  241             biphasic         +---------------+--------+--------+--------+--------+ Proximal Stent 241             biphasic         +---------------+--------+--------+--------+--------+ Mid Stent      258             biphasic         +---------------+--------+--------+--------+--------+ Distal Stent   359 (was 134 in 12/19)            biphasic         +---------------+--------+--------+--------+--------+ Distal to Stent292             biphasic         +---------------+--------+--------+--------+--------+   Summary: Stenosis: +-------------------+---------------+ Location           Stent           +-------------------+---------------+ Right Common Iliac 50-99% stenosis +-------------------+---------------+ Left External Iliacno stenosis      +-------------------+---------------+   ABI (Date: 06/05/2018): ABI Findings: +---------+------------------+-----+--------+--------+ Right    Rt Pressure (mmHg)IndexWaveformComment  +---------+------------------+-----+--------+--------+ Brachial 149                                     +---------+------------------+-----+--------+--------+ ATA      81                0.54 biphasic         +---------+------------------+-----+--------+--------+ PTA      92                0.62 biphasic         +---------+------------------+-----+--------+--------+ Great Toe66                0.44                  +---------+------------------+-----+--------+--------+  +---------+------------------+-----+----------+-------+ Left     Lt Pressure (mmHg)IndexWaveform  Comment +---------+------------------+-----+----------+-------+ Brachial 117                                      +---------+------------------+-----+----------+-------+ ATA      143               0.96 monophasic        +---------+------------------+-----+----------+-------+ PTA      149               1.00 biphasic          +---------+------------------+-----+----------+-------+ Great Toe99                0.66                   +---------+------------------+-----+----------+-------+  +-------+-----------+-----------+------------+------------+ ABI/TBIToday's ABIToday's TBIPrevious ABIPrevious TBI +-------+-----------+-----------+------------+------------+ Right  0.62       0.44       0.54        0.55         +-------+-----------+-----------+------------+------------+ Left   1.0        0.66       1.02        0.62         +-------+-----------+-----------+------------+------------+  Bilateral ABIs appear essentially unchanged compared to prior  study on 03/04/2018.   Summary: Right: Resting right ankle-brachial index indicates moderate right lower extremity  arterial disease. The right toe-brachial index is abnormal. RT great toe pressure = 66 mmHg.  Left: Resting left ankle-brachial index is within normal range. No evidence of significant left lower extremity arterial disease. The left toe-brachial index is abnormal. LT Great toe pressure = 99 mmHg.   Carotid Duplex (04-24-18) requested by Dr. Martinique: Right Carotid: Velocities in the right ICA are consistent with a 40-59%                stenosis. Hemodynamically significant plaque >50% visualized in                the CCA. The ECA appears >50% stenosed. Left Carotid: Velocities in the left ICA are consistent with a 40-59% stenosis.               Hemodynamically significant plaque >50% visualized in the CCA. The               ECA appears >50% stenosed. Vertebrals:  Right vertebral artery demonstrates antegrade flow. Left vertebral              artery demonstrates bidirectional flow. Subclavians: Left subclavian artery was stenotic. Normal flow hemodynamics were              seen in the right subclavian artery.   PLAN:  Based on the patient's vascular studies and examination, pt will return to clinic in 3 months with ABI's and bilateral iliac stent duplex, see Dr. Carlis Abbott afterward. I advised pt to notify us if she develops concerns re the circulation in her feet or legs.   Over 3 minutes was spent counseling patient re smoking cessation, and patient was given several free resources re smoking cessation.  I discussed in depth with the patient the nature of atherosclerosis, and emphasized the importance of maximal medical management including strict control of blood pressure, blood glucose, and lipid levels, obtaining regular exercise, and cessation of smoking.  The patient is aware that without maximal medical management the underlying atherosclerotic disease process will progress, limiting the benefit of any interventions.  The patient was given information about PAD including signs, symptoms,  treatment, what symptoms should prompt the patient to seek immediate medical care, and risk reduction measures to take.  Clemon Chambers, RN, MSN, FNP-C [facility] Phone: [phone]  Clinic MD: Donzetta Matters  06/05/18 10:27 AM

## 2018-06-23 ENCOUNTER — Other Ambulatory Visit: Payer: Self-pay | Admitting: Cardiothoracic Surgery

## 2018-06-23 DIAGNOSIS — C3491 Malignant neoplasm of unspecified part of right bronchus or lung: Secondary | ICD-10-CM

## 2018-06-24 ENCOUNTER — Other Ambulatory Visit: Payer: Self-pay | Admitting: Family Medicine

## 2018-06-24 ENCOUNTER — Other Ambulatory Visit: Payer: Self-pay | Admitting: Physician Assistant

## 2018-06-24 ENCOUNTER — Other Ambulatory Visit: Payer: Self-pay

## 2018-06-24 DIAGNOSIS — G8929 Other chronic pain: Secondary | ICD-10-CM

## 2018-06-24 DIAGNOSIS — G8918 Other acute postprocedural pain: Secondary | ICD-10-CM

## 2018-06-24 DIAGNOSIS — M545 Low back pain: Principal | ICD-10-CM

## 2018-06-24 MED ORDER — TRAMADOL HCL 50 MG PO TABS: 50.0000 mg | ORAL_TABLET | Freq: Four times a day (QID) | ORAL | 0 refills | Status: DC | PRN

## 2018-06-24 MED ORDER — METOPROLOL SUCCINATE ER 25 MG PO TB24: 25.0000 mg | ORAL_TABLET | Freq: Every day | ORAL | 3 refills | Status: DC

## 2018-06-25 ENCOUNTER — Other Ambulatory Visit: Payer: Self-pay

## 2018-06-25 ENCOUNTER — Telehealth: Payer: Self-pay | Admitting: *Deleted

## 2018-06-25 DIAGNOSIS — G8918 Other acute postprocedural pain: Secondary | ICD-10-CM

## 2018-06-25 MED ORDER — FUROSEMIDE 40 MG PO TABS: 20.0000 mg | ORAL_TABLET | Freq: Every day | ORAL | 6 refills | Status: DC

## 2018-06-25 NOTE — Telephone Encounter (Signed)
Received fax from pharmacy.  The last script says 0 refills but also not to exceed 5 additional refills.   Called pharmacy to clarify that 0 refills were correct. Changed med to match so that it does not happen again.  Christen Bame, CMA

## 2018-06-29 ENCOUNTER — Other Ambulatory Visit: Payer: Self-pay

## 2018-06-29 MED ORDER — FUROSEMIDE 40 MG PO TABS: 20.0000 mg | ORAL_TABLET | Freq: Every day | ORAL | 6 refills | Status: DC

## 2018-07-07 ENCOUNTER — Other Ambulatory Visit: Payer: Self-pay | Admitting: Family Medicine

## 2018-07-21 ENCOUNTER — Other Ambulatory Visit: Payer: Self-pay | Admitting: Family Medicine

## 2018-07-27 ENCOUNTER — Other Ambulatory Visit: Payer: Self-pay | Admitting: Family Medicine

## 2018-07-27 DIAGNOSIS — G8918 Other acute postprocedural pain: Secondary | ICD-10-CM

## 2018-07-28 ENCOUNTER — Other Ambulatory Visit: Payer: Self-pay | Admitting: Family Medicine

## 2018-07-28 DIAGNOSIS — G8918 Other acute postprocedural pain: Secondary | ICD-10-CM

## 2018-08-25 ENCOUNTER — Other Ambulatory Visit: Payer: Self-pay | Admitting: Family Medicine

## 2018-08-25 ENCOUNTER — Other Ambulatory Visit: Payer: Self-pay

## 2018-08-25 DIAGNOSIS — I70219 Atherosclerosis of native arteries of extremities with intermittent claudication, unspecified extremity: Secondary | ICD-10-CM

## 2018-08-25 DIAGNOSIS — G8918 Other acute postprocedural pain: Secondary | ICD-10-CM

## 2018-08-25 DIAGNOSIS — I779 Disorder of arteries and arterioles, unspecified: Secondary | ICD-10-CM

## 2018-08-26 ENCOUNTER — Other Ambulatory Visit: Payer: Self-pay | Admitting: Family Medicine

## 2018-08-31 ENCOUNTER — Telehealth (HOSPITAL_COMMUNITY): Payer: Self-pay | Admitting: Rehabilitation

## 2018-08-31 NOTE — Telephone Encounter (Signed)

## 2018-09-01 ENCOUNTER — Other Ambulatory Visit: Payer: Self-pay

## 2018-09-01 ENCOUNTER — Ambulatory Visit (INDEPENDENT_AMBULATORY_CARE_PROVIDER_SITE_OTHER): Payer: Medicare Other | Admitting: Vascular Surgery

## 2018-09-01 ENCOUNTER — Encounter: Payer: Self-pay | Admitting: Vascular Surgery

## 2018-09-01 ENCOUNTER — Ambulatory Visit (INDEPENDENT_AMBULATORY_CARE_PROVIDER_SITE_OTHER)
Admission: RE | Admit: 2018-09-01 | Discharge: 2018-09-01 | Disposition: A | Discharge status: Home / Self Care | Payer: Medicare Other | Source: Ambulatory Visit | Attending: Family | Admitting: Family

## 2018-09-01 ENCOUNTER — Ambulatory Visit (HOSPITAL_COMMUNITY)
Admission: RE | Admit: 2018-09-01 | Discharge: 2018-09-01 | Disposition: A | Discharge status: Home / Self Care | Payer: Medicare Other | Source: Ambulatory Visit | Attending: Family | Admitting: Family

## 2018-09-01 VITALS — BP 157/74 | HR 63 | Temp 98.1°F | Ht 73.0 in | Wt 160.2 lb

## 2018-09-01 DIAGNOSIS — I70219 Atherosclerosis of native arteries of extremities with intermittent claudication, unspecified extremity: Secondary | ICD-10-CM

## 2018-09-01 DIAGNOSIS — I779 Disorder of arteries and arterioles, unspecified: Secondary | ICD-10-CM

## 2018-09-01 DIAGNOSIS — I739 Peripheral vascular disease, unspecified: Secondary | ICD-10-CM

## 2018-09-01 NOTE — Progress Notes (Signed)
Patient name: Leah [name] MRN: [id number] DOB: Mar 22, 1951 Sex: female  REASON FOR CONSULT: Follow-up L EIA stent with threatened bypass HPI: Leah [name] is a 68 y.o. female with multiple medical comorbidities including coronary artery disease status post CABG and mitral valve replacement, heart failure, COPD, tobacco abuse, hypertension, hyperlipidemia, bilateral renal stents for renal artery stenosis, and history of claudication with left common femoral to below-knee pop bypass in 2012 by Dr. Kellie Simmering that presents for interval 3 month follow-up to discuss elevated velocity proximal to L EIA stent placed 02/04/18 by myself.  She states her legs overall are doing well.  She has noticed walking very very long distances she does get some claudication symptoms in the left calf.  She continues to smoke and states with a COVID issue is been difficult to stop.  Also of note she underwent bilateral renal artery stents by cardiology and 2012.  She has a 6 x 12 Herculink on the right and a 6 x 15 Herculink on the left.  There was evidence of bilateral renal artery stenosis on her last aortogram in November.  Taking aspirin and Plavix.  Past Medical History:  Diagnosis Date  . Anemia   . Bronchogenic lung cancer, right ([place]) 11/29/2016  . CAD (coronary artery disease)    a. s/p LIMA-LAD in 11/2014  . Carotid artery occlusion   . CHF (congestive heart failure) ([place])   . Complication of anesthesia    slow to awaken x 1 maybe 2001  . COPD (chronic obstructive pulmonary disease) ([place])   . GERD (gastroesophageal reflux disease)    "sometimes" takes Copywriter, advertising or drinks gingerale   . Headache(784.0)   . History of kidney stones   . Hyperlipidemia   . Hypertension   . Leg pain   . Peripheral vascular disease ([place])   . Renal vascular disease 10/26/2014   bilateral stents placed   . Severe mitral regurgitation    a. s/p MVR in 11/2014 with a pericardial tissue valve  . Shortness of breath  dyspnea   . Tobacco abuse     Past Surgical History:  Procedure Laterality Date  . ABDOMINAL HYSTERECTOMY    . ANGIOPLASTY / STENTING ILIAC  2010   right external iliac by Dr. Irish Lack  . CARDIAC CATHETERIZATION  11/13/11   Left Heart Cath. with Coronary Angiogram  . CARDIAC CATHETERIZATION N/A 08/24/2014   Procedure: Left Heart Cath and Coronary Angiography;  Surgeon: Wellington Hampshire, MD;  Location: [facility];  Service: Cardiovascular;  Laterality: N/A;  . CARDIAC CATHETERIZATION N/A 10/26/2014   Procedure: Right/Left Heart Cath and Coronary Angiography;  Surgeon: Peter M Martinique, MD; oLAD 70%, mLAD 70% ISR, D2 30%, OFC 30%, RCA 20%, EF nl, low R heart pressures after diuresis, severe MR  . CAROTID ENDARTERECTOMY  11/21/2007   left  . COLONOSCOPY W/ POLYPECTOMY    . CORONARY ANGIOPLASTY WITH STENT PLACEMENT  6/09   LAD 2.5x12 Promus  . CORONARY ARTERY BYPASS GRAFT N/A 12/05/2014   Procedure: CORONARY ARTERY BYPASS GRAFTING (CABG);  Surgeon: [name], MD;  Location: [facility];  Service: Open Heart Surgery;  Laterality: N/A;  Times 1 using left internal mammary artery to LAD  . FEMORAL-POPLITEAL BYPASS GRAFT  10/12   left Dr. Kellie Simmering  . LOWER EXTREMITY ANGIOGRAPHY N/A 02/04/2018   Procedure: LOWER EXTREMITY ANGIOGRAPHY;  Surgeon: Marty Heck, MD;  Location: [facility];  Service: Cardiovascular;  Laterality: N/A;  . MITRAL  VALVE REPAIR  2016  . MITRAL VALVE REPLACEMENT N/A 12/05/2014   Procedure: MITRAL VALVE (MV) REPLACEMENT;  Surgeon: [name], MD;  Location: [facility];  Service: Open Heart Surgery;  Laterality: N/A;  Closure left atrial appendage  . PERIPHERAL VASCULAR INTERVENTION Left 02/04/2018   Procedure: PERIPHERAL VASCULAR INTERVENTION;  Surgeon: Marty Heck, MD;  Location: [facility];  Service: Cardiovascular;  Laterality: Left;  external iliac  . RENAL ARTERY STENT Bilateral   . TEE WITHOUT CARDIOVERSION N/A 10/24/2014   Procedure:  TRANSESOPHAGEAL ECHOCARDIOGRAM (TEE);  Surgeon: Thayer Headings, MD;  Location: [facility];  Service: Cardiovascular;  Laterality: N/A;  . TEE WITHOUT CARDIOVERSION N/A 12/05/2014   Procedure: TRANSESOPHAGEAL ECHOCARDIOGRAM (TEE);  Surgeon: [name], MD;  Location: [facility];  Service: Open Heart Surgery;  Laterality: N/A;  . VIDEO ASSISTED THORACOSCOPY (VATS)/WEDGE RESECTION Right 11/29/2016   Procedure: VIDEO ASSISTED THORACOSCOPY (VATS)/ RUL WEDGE RESECTION OF LESION/ NODE SAMPLING;  Surgeon: [name], MD;  Location: [facility];  Service: Thoracic;  Laterality: Right;  Marland Kitchen VIDEO BRONCHOSCOPY N/A 11/29/2016   Procedure: VIDEO BRONCHOSCOPY;  Surgeon: [name], MD;  [facility];  Service: Thoracic;  Laterality: N/A;    Family History  Problem Relation Age of Onset  . Cancer Mother        BRAIN AND LUNG  . Hypertension Mother   . Hypertension Father   . Heart disease Father   . Prostate cancer Father   . Kidney disease Father   . Hypertension Brother   . ALS Brother   . Stroke Paternal Aunt        great aunt  . Heart attack Neg Hx   . Colon cancer Neg Hx   . Stomach cancer Neg Hx   . Rectal cancer Neg Hx   . Esophageal cancer Neg Hx   . Liver cancer Neg Hx     SOCIAL HISTORY: Social History   Socioeconomic History  . Marital status: Divorced    Spouse name: Not on file  . Number of children: 4  . Years of education: Not on file  . Highest education level: Not on file  Occupational History  . Occupation: retired  Scientific laboratory technician  . Financial resource strain: Not on file  . Food insecurity:    Worry: Not on file    Inability: Not on file  . Transportation needs:    Medical: Not on file    Non-medical: Not on file  Tobacco Use  . Smoking status: Current Every Day Smoker    Packs/day: 0.50    Years: 35.00    Pack years: 17.50    Types: Cigarettes  . Smokeless tobacco: Never Used  Substance and Sexual Activity  . Alcohol use: Yes    Alcohol/week:  0.0 standard drinks    Comment: occasion  . Drug use: No  . Sexual activity: Never  Lifestyle  . Physical activity:    Days per week: Not on file    Minutes per session: Not on file  . Stress: Not on file  Relationships  . Social connections:    Talks on phone: Not on file    Gets together: Not on file    Attends religious service: Not on file    Active member of club or organization: Not on file    Attends meetings of clubs or organizations: Not on file    Relationship status: Not on file  . Intimate partner violence:  Fear of current or ex partner: Not on file    Emotionally abused: Not on file    Physically abused: Not on file    Forced sexual activity: Not on file  Other Topics Concern  . Not on file  Social History Narrative  . Not on file    Allergies  Allergen Reactions  . Lisinopril Swelling    Angioedema 06/10/11  . Chantix [Varenicline] Other (See Comments)    Caused insomnia  . Penicillins Hives    Has patient had a PCN reaction causing immediate rash, facial/tongue/throat swelling, SOB or lightheadedness with hypotension: Yes Has patient had a PCN reaction causing severe rash involving mucus membranes or skin necrosis: No Has patient had a PCN reaction that required hospitalization: No Has patient had a PCN reaction occurring within the last 10 years: No If all of the above answers are "NO", then may proceed with Cephalosporin use.     Current Outpatient Medications  Medication Sig Dispense Refill  . albuterol (PROVENTIL HFA;VENTOLIN HFA) 108 (90 Base) MCG/ACT inhaler Inhale 2 puffs into the lungs every 6 (six) hours as needed for wheezing or shortness of breath. 1 Inhaler 0  . aspirin EC 81 MG tablet Take 1 tablet (81 mg total) by mouth daily. 90 tablet 1  . clopidogrel (PLAVIX) 75 MG tablet Take 1 tablet (75 mg total) by mouth daily. 30 tablet 11  . cyclobenzaprine (FLEXERIL) 10 MG tablet Take 1 tablet by mouth three times daily as needed for muscle  spasm 180 tablet 0  . diclofenac sodium (VOLTAREN) 1 % GEL Apply 4 g topically 4 (four) times daily. (Patient taking differently: Apply 4 g topically 4 (four) times daily as needed (for back pain.). ) 100 g 0  . ezetimibe (ZETIA) 10 MG tablet Take 1 tablet (10 mg total) by mouth daily. 90 tablet 3  . furosemide (LASIX) 40 MG tablet Take 0.5 tablets (20 mg total) by mouth daily. 30 tablet 6  . gabapentin (NEURONTIN) 100 MG capsule TAKE 2 CAPSULES BY MOUTH THREE TIMES DAILY 120 capsule 0  . isosorbide-hydrALAZINE (BIDIL) 20-37.5 MG tablet Take 1 tablet by mouth 3 (three) times daily. 90 tablet 11  . metoprolol succinate (TOPROL-XL) 25 MG 24 hr tablet Take 1 tablet (25 mg total) by mouth daily. 90 tablet 3  . nitroGLYCERIN (NITROSTAT) 0.4 MG SL tablet Place 1 tablet (0.4 mg total) under the tongue every 5 (five) minutes x 3 doses as needed for chest pain. 25 tablet 12  . potassium chloride SA (K-DUR,KLOR-CON) 20 MEQ tablet Take 2 tablets ( 40 meq ) twice a day 90 tablet 3  . traMADol (ULTRAM) 50 MG tablet TAKE 1 TABLET BY MOUTH EVERY 6 HOURS AS NEEDED FOR PAIN 90 tablet 0  . traZODone (DESYREL) 100 MG tablet Take 1 tablet (100 mg total) by mouth at bedtime. 90 tablet 0  . atorvastatin (LIPITOR) 80 MG tablet Take 1 tablet (80 mg total) by mouth daily. 30 tablet 6   No current facility-administered medications for this visit.     REVIEW OF SYSTEMS:  [X]  denotes positive finding, [ ]  denotes negative finding Cardiac  Comments:  Chest pain or chest pressure:    Shortness of breath upon exertion:    Short of breath when lying flat:    Irregular heart rhythm:        Vascular    Pain in calf, thigh, or hip brought on by ambulation: x   Pain in feet at night that wakes  you up from your sleep:     Blood clot in your veins:    Leg swelling:         Pulmonary    Oxygen at home:    Productive cough:     Wheezing:         Neurologic    Sudden weakness in arms or legs:     Sudden numbness in arms  or legs:     Sudden onset of difficulty speaking or slurred speech:    Temporary loss of vision in one eye:     Problems with dizziness:         Gastrointestinal    Blood in stool:     Vomited blood:         Genitourinary    Burning when urinating:     Blood in urine:        Psychiatric    Major depression:         Hematologic    Bleeding problems:    Problems with blood clotting too easily:        Skin    Rashes or ulcers:        Constitutional    Fever or chills:      PHYSICAL EXAM: Vitals:   09/01/18 0937  BP: (!) 157/74  Pulse: 63  Temp: 98.1 F (36.7 C)  TempSrc: Temporal  SpO2: 99%  Weight: 160 lb 3.2 oz (72.7 kg)  Height: 6\' 1"  (1.854 m)    GENERAL: The patient is a well-nourished female, in no acute distress. The vital signs are documented above. CARDIAC: There is a regular rate and rhythm.  VASCULAR:  2+ femoral pulse palpable bilateral groins Left Pt weakly palpable PULMONARY: There is good air exchange bilaterally without wheezing or rales. ABDOMEN: Soft and non-tender with normal pitched bowel sounds.  MUSCULOSKELETAL: There are no major deformities or cyanosis. NEUROLOGIC: No focal weakness or paresthesias are detected.   DATA:   ABI's 0.58 right, 1.04 left, elevated velocity 420 proximal to L EIA stent.  Assessment/Plan:  68 year old female with multiple medical comorbidities that previously underwent left common femoral to below-knee popliteal bypass in 2012 for claudication by Dr. Kellie Simmering.. She had recurrent claudication symptoms and then underwent left external iliac stent by myself on 02/04/2018 with a threatened bypass.  She since recovered well.  She was last seen by the nurse practitioner and scheduled see me at 51-month follow-up.  Her velocity proximal to the left external iliac stent continues to elevate now 400.  I recommended we return for aortogram possible left iliac intervention given that this iliac artery stent serves as inflow to  her bypass.  I think if her stent goes down it could be problematic.  In addition we will get a renal artery duplex to evaluate her renal stents since these look stenotic on her last aortogram and we could also potentially intervene on one or both stents if necessary.  I discussed all this with the patient.  Discussed the importance of smoking cessation given that I think this is largely contributing factor to all of her in-stent restenosis.  Marty Heck, MD [facility] Office: [phone] Pager: [place]

## 2018-09-02 ENCOUNTER — Other Ambulatory Visit: Payer: Self-pay

## 2018-09-02 DIAGNOSIS — I739 Peripheral vascular disease, unspecified: Secondary | ICD-10-CM

## 2018-09-03 ENCOUNTER — Other Ambulatory Visit: Payer: Self-pay

## 2018-09-03 ENCOUNTER — Ambulatory Visit (INDEPENDENT_AMBULATORY_CARE_PROVIDER_SITE_OTHER): Payer: Medicare Other | Admitting: Cardiothoracic Surgery

## 2018-09-03 ENCOUNTER — Ambulatory Visit
Admission: RE | Admit: 2018-09-03 | Discharge: 2018-09-03 | Disposition: A | Discharge status: Home / Self Care | Payer: Medicare Other | Source: Ambulatory Visit | Attending: Cardiothoracic Surgery | Admitting: Cardiothoracic Surgery

## 2018-09-03 VITALS — BP 160/90 | HR 76 | Temp 97.5°F | Resp 20 | Ht 73.0 in | Wt 160.0 lb

## 2018-09-03 DIAGNOSIS — I779 Disorder of arteries and arterioles, unspecified: Secondary | ICD-10-CM | POA: Diagnosis not present

## 2018-09-03 DIAGNOSIS — C3491 Malignant neoplasm of unspecified part of right bronchus or lung: Secondary | ICD-10-CM

## 2018-09-03 DIAGNOSIS — I712 Thoracic aortic aneurysm, without rupture, unspecified: Secondary | ICD-10-CM | POA: Insufficient documentation

## 2018-09-03 DIAGNOSIS — C3411 Malignant neoplasm of upper lobe, right bronchus or lung: Secondary | ICD-10-CM | POA: Diagnosis not present

## 2018-09-03 DIAGNOSIS — I739 Peripheral vascular disease, unspecified: Secondary | ICD-10-CM

## 2018-09-03 NOTE — Progress Notes (Signed)
[address]       [place] [address]             [phone]                  Leah [name] Hardy Medical Record #[id number] Date of Birth: March 09, 1951  Referring PJ:KDTOIZTI, Edison Nasuti, MD Primary Cardiology:  Peter Martinique, MD Primary Care:Fletcher, Edison Nasuti, MD  Chief Complaint:  Follow Up Visit  Cancer Staging Bronchogenic lung cancer, right [facility]) Staging form: Lung, AJCC 8th Edition - Pathologic stage from 12/03/2016: Stage IA1 (pT1a, pN0, cM0) - Signed by [name], MD on 12/03/2016  11/29/2016 OPERATIVE REPORT PREOPERATIVE DIAGNOSIS:  Slowly enlarging right upper lobe lung nodule. POSTOPERATIVE DIAGNOSIS:  Slowly enlarging right upper lobe lung nodule, probably inflammatory nodule. PROCEDURES PERFORMED:  Bronchoscopy, right video-assisted thoracoscopy with wedge resection of right upper lobe lung lesion and lymph node sampling. SURGEON:  Lanelle Bal, MD  12/05/2014  OPERATIVE REPORT PREOPERATIVE DIAGNOSES: 1. Severe mitral insufficiency. 2. Aortic insufficiency. 3. Coronary occlusive disease. POSTOPERATIVE DIAGNOSES: 1. Severe mitral insufficiency. 2. Aortic insufficiency. 3. Coronary occlusive disease. 4. Aortic insufficiency, mild. PROCEDURE PERFORMED: Mitral valve replacement with [employer] pericardial tissue valve, model [id number] 29 mm, serial #[id number] and coronary artery bypass grafting x1 with the left internal mammary to the left anterior descending coronary artery. Closure of left atrial appendage. SURGEON: Lanelle Bal, MD.   History of Present Illness:     Patient returns office today in follow-up CT scanning of the chest for evaluation following wedge resection of right upper lobe for stage I non-small cell lung cancer.  Patient has stable congestive heart failure symptoms stage I-II.  She does get short of breath with significant exertion but is able to do usual daily activities without  difficulty.  Unfortunately she continues to smoke. She notes she is to come in for a procedure on to evaluate her renal arteries where she is previously had stents placed in the coming weeks.    Zubrod Score: At the time of surgery this patients most appropriate activity status/level should be described as: []     0    Normal activity, no symptoms [x]     1    Restricted in physical strenuous activity but ambulatory, able to do out light work []     2    Ambulatory and capable of self care, unable to do work activities, up and about                 >50 % of waking hours                                                                                   []     3    Only limited self care, in bed greater than 50% of waking hours []     4    Completely disabled, no self care, confined to bed or chair []     5    Moribund  Social History   Tobacco Use  Smoking Status Current Every Day Smoker   Packs/day: 0.50   Years: 35.00   Pack years: 17.50  Types: Cigarettes  Smokeless Tobacco Never Used       Allergies  Allergen Reactions   Lisinopril Swelling    Angioedema 06/10/11   Chantix [Varenicline] Other (See Comments)    Caused insomnia   Penicillins Hives    Has patient had a PCN reaction causing immediate rash, facial/tongue/throat swelling, SOB or lightheadedness with hypotension: Yes Has patient had a PCN reaction causing severe rash involving mucus membranes or skin necrosis: No Has patient had a PCN reaction that required hospitalization: No Has patient had a PCN reaction occurring within the last 10 years: No If all of the above answers are "NO", then may proceed with Cephalosporin use.     Current Outpatient Medications  Medication Sig Dispense Refill   albuterol (PROVENTIL HFA;VENTOLIN HFA) 108 (90 Base) MCG/ACT inhaler Inhale 2 puffs into the lungs every 6 (six) hours as needed for wheezing or shortness of breath. 1 Inhaler 0   aspirin EC 81 MG tablet Take 1 tablet  (81 mg total) by mouth daily. 90 tablet 1   atorvastatin (LIPITOR) 80 MG tablet Take 1 tablet (80 mg total) by mouth daily. 30 tablet 6   clopidogrel (PLAVIX) 75 MG tablet Take 1 tablet (75 mg total) by mouth daily. 30 tablet 11   cyclobenzaprine (FLEXERIL) 10 MG tablet Take 1 tablet by mouth three times daily as needed for muscle spasm 180 tablet 0   diclofenac sodium (VOLTAREN) 1 % GEL Apply 4 g topically 4 (four) times daily. (Patient taking differently: Apply 4 g topically 4 (four) times daily as needed (for back pain.). ) 100 g 0   ezetimibe (ZETIA) 10 MG tablet Take 1 tablet (10 mg total) by mouth daily. 90 tablet 3   furosemide (LASIX) 40 MG tablet Take 0.5 tablets (20 mg total) by mouth daily. 30 tablet 6   gabapentin (NEURONTIN) 100 MG capsule TAKE 2 CAPSULES BY MOUTH THREE TIMES DAILY 120 capsule 0   isosorbide-hydrALAZINE (BIDIL) 20-37.5 MG tablet Take 1 tablet by mouth 3 (three) times daily. 90 tablet 11   metoprolol succinate (TOPROL-XL) 25 MG 24 hr tablet Take 1 tablet (25 mg total) by mouth daily. 90 tablet 3   nitroGLYCERIN (NITROSTAT) 0.4 MG SL tablet Place 1 tablet (0.4 mg total) under the tongue every 5 (five) minutes x 3 doses as needed for chest pain. 25 tablet 12   potassium chloride SA (K-DUR,KLOR-CON) 20 MEQ tablet Take 2 tablets ( 40 meq ) twice a day 90 tablet 3   traMADol (ULTRAM) 50 MG tablet TAKE 1 TABLET BY MOUTH EVERY 6 HOURS AS NEEDED FOR PAIN 90 tablet 0   traZODone (DESYREL) 100 MG tablet Take 1 tablet (100 mg total) by mouth at bedtime. 90 tablet 0   No current facility-administered medications for this visit.        Physical Exam: BP (!) 160/90    Pulse 76    Temp (!) 97.5 F (36.4 C) (Skin)    Resp 20    Ht 6\' 1"  (1.854 m)    Wt 160 lb (72.6 kg)    SpO2 94% Comment: RA   BMI 21.11 kg/m  General appearance: alert, cooperative and appears older than stated age Head: Normocephalic, without obvious abnormality, atraumatic Neck: no adenopathy,  no carotid bruit, no JVD, supple, symmetrical, trachea midline and thyroid not enlarged, symmetric, no tenderness/mass/nodules Lymph nodes: Cervical, supraclavicular, and axillary nodes normal. Resp: clear to auscultation bilaterally Back: symmetric, no curvature. ROM normal. No CVA tenderness. GI: soft, non-tender;  bowel sounds normal; no masses,  no organomegaly Extremities: extremities normal, atraumatic, no cyanosis or edema and Homans sign is negative, no sign of DVT Neurologic: Grossly normal Patient has grade 2 diastolic murmur along the left sternal border suggestive of aortic insufficiency sternum is stable and well-healed  Diagnostic Studies & Laboratory data:         Recent Radiology Findings: Ct Chest Wo Contrast  Result Date: 09/03/2018 CLINICAL DATA:  Follow-up right upper lobe lung cancer with VATS. EXAM: CT CHEST WITHOUT CONTRAST TECHNIQUE: Multidetector CT imaging of the chest was performed following the standard protocol without IV contrast. COMPARISON:  08/07/2017 FINDINGS: Cardiovascular: Sternotomy wires are present. Mild cardiomegaly. Mitral valve replacement. Calcified plaque over the left main and 3 vessel coronary arteries. Calcified plaque over the thoracic aorta. Slight left lateral projection from the aortic arch just distal to the takeoff of the left subclavian artery without significant change in may represent of small pseudoaneurysm. Mild dilatation of the descending thoracic aorta measuring 4.2 cm in AP diameter (previously 4 cm). Remaining vascular structures are unremarkable. Mediastinum/Nodes: No significant mediastinal or hilar adenopathy. Remaining mediastinal structures are unremarkable. Lungs/Pleura: Lungs are adequately inflated with postsurgical changes over the right upper lobe compatible with previous right upper lobe wedge resection for lung cancer. 4-5 mm triangular nodular density just inferior to the surgical site over the anterior right upper lobe  slightly more prominent. No additional nodules or masses. Mild-to-moderate centrilobular emphysematous disease is present. No effusion. Minimal linear scarring over the lung bases. Airways are unremarkable. Upper Abdomen: Calcified plaque over the abdominal aorta. Bilateral renal artery stents. Musculoskeletal: Mild degenerative change of the spine. IMPRESSION: Postsurgical change compatible previous right upper lobe wedge resection for lung cancer. 4-5 mm triangular nodular opacity just below the scar slightly more prominent. Recommend attention on follow-up to exclude metastatic nodule. No adenopathy. Aortic Atherosclerosis (ICD10-I70.0) and Emphysema (ICD10-J43.9). Stable contour abnormality over the left lateral aspect of the aortic arch possibly due to small pseudoaneurysm. Mild aneurysmal dilatation of the descending thoracic aorta measuring 4.2 cm in AP diameter (previously 4 cm). Recommend annual imaging followup by CTA or MRA. This recommendation follows 2010 ACCF/AHA/AATS/ACR/ASA/SCA/SCAI/SIR/STS/SVM Guidelines for the Diagnosis and Management of Patients with Thoracic Aortic Disease. Circulation. 2010; 121: E268-T419. Aortic aneurysm NOS (ICD10-I71.9). Mild cardiomegaly. Left main and 3 vessel atherosclerotic coronary artery disease. Mitral valve replacement. Electronically Signed   By: Marin Olp M.D.   On: 09/03/2018 09:41  I have independently reviewed the above radiology studies  and reviewed the findings with the patient.   Vas [place] Burnard Bunting With/wo Tbi  Result Date: 09/01/2018 LOWER EXTREMITY DOPPLER STUDY Indications: Peripheral artery disease. High Risk Factors: Hypertension, hyperlipidemia, current smoker.  Vascular Interventions: Left EIA stents 02/04/18 Prior right common iliac artery                         stent. Performing Technologist: Ronal Fear RVS, RCS  Examination Guidelines: A complete evaluation includes at minimum, Doppler waveform signals and systolic blood pressure reading at  the level of bilateral brachial, anterior tibial, and posterior tibial arteries, when vessel segments are accessible. Bilateral testing is considered an integral part of a complete examination. Photoelectric Plethysmograph (PPG) waveforms and toe systolic pressure readings are included as required and additional duplex testing as needed. Limited examinations for reoccurring indications may be performed as noted.  ABI Findings: +---------+------------------+-----+----------+--------+  Right     Rt Pressure (mmHg) Index Waveform   Comment   +---------+------------------+-----+----------+--------+  Brachial  144                                           +---------+------------------+-----+----------+--------+  PTA       88                 0.58  monophasic           +---------+------------------+-----+----------+--------+  DP        84                 0.56  monophasic           +---------+------------------+-----+----------+--------+  Great Toe 65                 0.43                       +---------+------------------+-----+----------+--------+ +---------+------------------+-----+--------+-------+  Left      Lt Pressure (mmHg) Index Waveform Comment  +---------+------------------+-----+--------+-------+  Brachial  151                                        +---------+------------------+-----+--------+-------+  PTA       155                1.03  biphasic          +---------+------------------+-----+--------+-------+  DP        157                1.04  biphasic          +---------+------------------+-----+--------+-------+  Great Toe 101                0.67                    +---------+------------------+-----+--------+-------+ +-------+-----------+-----------+------------+------------+  ABI/TBI Today's ABI Today's TBI Previous ABI Previous TBI  +-------+-----------+-----------+------------+------------+  Right   0.58        0.43        0.62         0.44           +-------+-----------+-----------+------------+------------+  Left    1.04        0.67        1.0          0.66          +-------+-----------+-----------+------------+------------+  Summary: Right: Resting right ankle-brachial index indicates moderate right lower extremity arterial disease. The right toe-brachial index is abnormal. Left: Resting left ankle-brachial index is within normal range. No evidence of significant left lower extremity arterial disease. The left toe-brachial index is abnormal.  *See table(s) above for measurements and observations.  Electronically signed by Monica Martinez MD on 09/01/2018 at 10:55:09 AM.    Final    Vas US Aorta/ivc/iliacs  Result Date: 09/01/2018 ABDOMINAL AORTA STUDY Indications: Surgery date 02/04/2018. Risk Factors: Hypertension, hyperlipidemia. Vascular Interventions: Left EIA stents 02/04/18 Prior right common iliac                         stent.  Performing Technologist: Ronal Fear RVS, RCS  Examination Guidelines: A complete evaluation includes B-mode imaging, spectral Doppler, color Doppler, and power Doppler as needed of all accessible portions of each vessel. Bilateral testing is considered  an integral part of a complete examination. Limited examinations for reoccurring indications may be performed as noted.  Right Stent(s): +---------------+--------+---------------+----------+--------+  common iliac    PSV cm/s Stenosis        Waveform   Comments  +---------------+--------+---------------+----------+--------+  Prox to Stent   38                       monophasic           +---------------+--------+---------------+----------+--------+  Proximal Stent  108                      biphasic             +---------------+--------+---------------+----------+--------+  Mid Stent       162                      biphasic             +---------------+--------+---------------+----------+--------+  Distal Stent    157                      biphasic              +---------------+--------+---------------+----------+--------+  Distal to Stent 211      50-99% stenosis biphasic             +---------------+--------+---------------+----------+--------+  Left Stent(s): +---------------+--------+---------------+----------+--------+  external iliac  PSV cm/s Stenosis        Waveform   Comments  +---------------+--------+---------------+----------+--------+  Prox to Stent   420      50-99% stenosis monophasic           +---------------+--------+---------------+----------+--------+  Proximal Stent  187                      monophasic           +---------------+--------+---------------+----------+--------+  Mid Stent       166                      monophasic           +---------------+--------+---------------+----------+--------+  Distal Stent    168                      monophasic           +---------------+--------+---------------+----------+--------+  Distal to Stent                          monophasic           +---------------+--------+---------------+----------+--------+  Summary: Stenosis: +------------------+-------------+---------------+  Location           Stenosis      Stent            +------------------+-------------+---------------+  Right Common Iliac               50-99% stenosis  +------------------+-------------+---------------+  Left Common Iliac  >50% stenosis                  +------------------+-------------+---------------+  *See table(s) above for measurements and observations.  Electronically signed by Monica Martinez MD on 09/01/2018 at 10:56:46 AM.    Final    Ct Chest Wo Contrast  Result Date: 08/07/2017 CLINICAL DATA:  Follow-up lung cancer. Shortness of breath. Right upper lobe wedge resection. EXAM: CT CHEST WITHOUT CONTRAST TECHNIQUE: Multidetector CT imaging of the chest was performed following the standard protocol without  IV contrast. COMPARISON:  CT scans November 12, 2016 and June 05, 2017. FINDINGS: Cardiovascular: The heart is unchanged.  Coronary artery calcifications are noted. The thoracic aorta is stable without significant aneurysm. Atherosclerotic changes noted. The central pulmonary artery is normal in caliber. Mediastinum/Nodes: The thyroid and esophagus are normal. No adenopathy in the chest. No effusions. Lungs/Pleura: Central airways are normal. No pneumothorax. Mild emphysematous changes in the upper lobes. A suture line in the right upper lobe is consistent with right upper lobe wedge resection, unchanged. No evidence recurrence at the resection site. No suspicious nodules or masses. No suspicious infiltrates. Upper Abdomen: Nonobstructive stone in the right kidney. No other acute abnormalities in the upper abdomen. Musculoskeletal: No chest wall mass or suspicious bone lesions identified. IMPRESSION: 1. Previous wedge resection in the right upper lobe with no evidence of recurrence. 2. Atherosclerotic changes in the visualized aorta. Coronary artery calcifications. 3. Emphysematous changes particularly in the upper lobes but also the lower lobes. 4. Nonobstructive right renal stone. Aortic Atherosclerosis (ICD10-I70.0) and Emphysema (ICD10-J43.9). Electronically Signed   By: Dorise Bullion III M.D   On: 08/07/2017 17:59    Mild dilation of  ascending aorta  < 4.0 cm    Recent Labs: Lab Results  Component Value Date   WBC 13.5 (H) 04/28/2018   HGB 13.0 04/28/2018   HCT 39.3 04/28/2018   PLT 194 04/28/2018   GLUCOSE 75 04/28/2018   CHOL 168 04/28/2018   TRIG 119 04/28/2018   HDL 48 04/28/2018   LDLDIRECT 185 (A) 09/09/2011   LDLCALC 96 04/28/2018   ALT 8 04/28/2018   AST 9 04/28/2018   NA 142 04/28/2018   K 3.4 (L) 04/28/2018   CL 103 04/28/2018   CREATININE 0.93 04/28/2018   BUN 13 04/28/2018   CO2 24 04/28/2018   TSH 1.756 11/28/2014   INR 1.03 11/29/2016   HGBA1C 5.6 01/15/2016    Echo: 12/2016 Result status: Final result                           *[facility]*                        [address], [place] [address]                            [id number]  ------------------------------------------------------------------- Transthoracic Echocardiography  Patient:    Leah [name], Leah [name] MR #:       [id number] Study Date: 01/13/2017 Gender:     F Age:        69 Height:     185.4 cm Weight:     80.3 kg BSA:        2.03 m^2 Pt. Status: Room:   SONOGRAPHER  Wyatt Mage, [place], Outpatient  ATTENDING    Ahmed Prima, Mancel Bale, [place], [place] M  cc:  ------------------------------------------------------------------- LV EF: 40%  ------------------------------------------------------------------- Indications:      Pre-op exam (Z01.81).  ------------------------------------------------------------------- History:   PMH:   Coronary artery disease.  Congestive heart failure.  Mitral valve disease.  Risk factors:  PVD. PVC. Former tobacco use. Hypertension.  -------------------------------------------------------------------  Study Conclusions  - Left ventricle: The cavity size was normal. Wall thickness was   normal. Systolic function was mildly to moderately reduced. The   estimated ejection fraction was 40%. Diffuse hypokinesis. There   was a reduced contribution of atrial contraction to ventricular   filling, due to increased ventricular diastolic pressure or   atrial contractile dysfunction. Doppler parameters are consistent   with high ventricular filling pressure. - Aortic valve: There was mild regurgitation. - Mitral valve: A bioprosthesis was present. with normal function.   Mean gradient (D): 2 mm Hg. Peak gradient (D): 10 mm Hg. - Left atrium: The atrium was moderately dilated. - Pulmonary arteries: PA peak pressure: 38 mm Hg (S).  ------------------------------------------------------------------- Labs, prior tests, procedures, and  surgery: Transthoracic echocardiography (03/10/2015).     EF was 45%.  Valve surgery.     Mitral valve replacement with a bioprosthetic valve.  ------------------------------------------------------------------- Study data:  Comparison was made to the study of 03/10/2015.  Study status:  Routine.  Procedure:  The patient reported no pain pre or post test. Transthoracic echocardiography. Image quality was adequate.  Study completion:  There were no complications. Transthoracic echocardiography.  M-mode, complete 2D, 3D, spectral Doppler, and color Doppler.  Birthdate:  Patient birthdate: [birth year].  Age:  Patient is 68 yr old.  Sex:  Gender: female. BMI: 23.4 kg/m^2.  Blood pressure:     151/78  Patient status: Outpatient.  Study date:  Study date: 01/13/2017. Study time: 09:09 AM.  Location:  [facility]  -------------------------------------------------------------------  ------------------------------------------------------------------- Left ventricle:  The cavity size was normal. Wall thickness was normal. Systolic function was mildly to moderately reduced. The estimated ejection fraction was 40%. Diffuse hypokinesis. There was a reduced contribution of atrial contraction to ventricular filling, due to increased ventricular diastolic pressure or atrial contractile dysfunction. Doppler parameters are consistent with high ventricular filling pressure.  ------------------------------------------------------------------- Aortic valve:   Trileaflet; normal thickness leaflets. Mobility was not restricted.  Doppler:  Transvalvular velocity was within the normal range. There was no stenosis. There was mild regurgitation.   ------------------------------------------------------------------- Aorta:  The aorta was normal, not dilated, and non-diseased. Aortic root: The aortic root was normal in  size.  ------------------------------------------------------------------- Mitral valve:  A bioprosthesis was present. with normal function. Mobility was not restricted.  Doppler:  Transvalvular velocity was within the normal range. There was no evidence for stenosis. mean gradient 2 mm Hg. There was no regurgitation.    Valve area by pressure half-time: 2.37 cm^2. Indexed valve area by pressure half-time: 1.17 cm^2/m^2. Valve area by continuity equation (using LVOT flow): 1.97 cm^2. Indexed valve area by continuity equation (using LVOT flow): 0.97 cm^2/m^2.    Mean gradient (D): 2 mm Hg. Peak gradient (D): 10 mm Hg.  ------------------------------------------------------------------- Left atrium:  The atrium was moderately dilated.  ------------------------------------------------------------------- Atrial septum:  The septum was normal.  ------------------------------------------------------------------- Pulmonary veins: Common pulmonary vein:  The Doppler velocity and flow profile were normal.  ------------------------------------------------------------------- Right ventricle:  The cavity size was normal. Wall thickness was normal. Systolic function was normal.  ------------------------------------------------------------------- Pulmonic valve:    Structurally normal valve.   Cusp separation was normal.  Doppler:  Transvalvular velocity was within the normal range. There was no evidence for stenosis. There was no regurgitation.  ------------------------------------------------------------------- Tricuspid valve:   Structurally normal valve.   Leaflet separation was normal.  Doppler:  Transvalvular velocity was within the normal range. There was mild regurgitation.  ------------------------------------------------------------------- Pulmonary artery:   The main pulmonary  artery was normal-sized. Systolic pressure was within the normal  range.  ------------------------------------------------------------------- Right atrium:  The atrium was normal in size.  ------------------------------------------------------------------- Pericardium:  The pericardium was normal in appearance. There was no pericardial effusion.  ------------------------------------------------------------------- Systemic veins: Inferior vena cava: The vessel was normal in size. The respirophasic diameter changes were in the normal range (= 50%), consistent with normal central venous pressure.  ------------------------------------------------------------------- Post procedure conclusions Ascending Aorta:  - The aorta was normal, not dilated, and non-diseased.  ------------------------------------------------------------------- Measurements   Left ventricle                            Value          Reference  LV ID, ED, PLAX chordal                   49.6  mm       43 - 52  LV ID, ES, PLAX chordal           (H)     42    mm       23 - 38  LV fx shortening, PLAX chordal    (L)     15    %        >=29  LV PW thickness, ED                       11.1  mm       ---------  IVS/LV PW ratio, ED                       1.02           <=1.3  Stroke volume, 2D                         58    ml       ---------  Stroke volume/bsa, 2D                     29    ml/m^2   ---------  LV e&', lateral                            3.43  cm/s     ---------  LV E/e&', lateral                          47.23          ---------  LV e&', medial                             4.6   cm/s     ---------  LV E/e&', medial                           35.22          ---------  LV e&', average                            4.02  cm/s     ---------  LV E/e&', average  40.35          ---------    Ventricular septum                        Value          Reference  IVS thickness, ED                         11.3  mm       ---------    LVOT                                       Value          Reference  LVOT ID, S                                20    mm       ---------  LVOT area                                 3.14  cm^2     ---------  LVOT peak velocity, S                     96.4  cm/s     ---------  LVOT mean velocity, S                     57    cm/s     ---------  LVOT VTI, S                               18.6  cm       ---------    Aortic valve                              Value          Reference  Aortic regurg pressure half-time          504   ms       ---------    Aorta                                     Value          Reference  Aortic root ID, ED                        31    mm       ---------  Ascending aorta ID, A-P, S                30    mm       ---------    Left atrium                               Value          Reference  LA ID, A-P, ES  48    mm       ---------  LA ID/bsa, A-P                    (H)     2.36  cm/m^2   <=2.2  LA volume, S                              61    ml       ---------  LA volume/bsa, S                          30    ml/m^2   ---------  LA volume, ES, 1-p A4C                    54.6  ml       ---------  LA volume/bsa, ES, 1-p A4C                26.8  ml/m^2   ---------  LA volume, ES, 1-p A2C                    66    ml       ---------  LA volume/bsa, ES, 1-p A2C                32.4  ml/m^2   ---------    Mitral valve                              Value          Reference  Mitral E-wave peak velocity               162   cm/s     ---------  Mitral A-wave peak velocity               60.4  cm/s     ---------  Mitral mean velocity, D                   54.8  cm/s     ---------  Mitral deceleration time          (H)     268   ms       150 - 230  Mitral pressure half-time                 93    ms       ---------  Mitral mean gradient, D                   2     mm Hg    ---------  Mitral peak gradient, D                   10    mm Hg    ---------  Mitral E/A ratio, peak                     2.7            ---------  Mitral valve area, PHT, DP                2.37  cm^2     ---------  Mitral valve area/bsa, PHT, DP            1.17  cm^2/m^2 ---------  Mitral valve area, LVOT                   1.97  cm^2     ---------  continuity  Mitral valve area/bsa, LVOT               0.97  cm^2/m^2 ---------  continuity  Mitral annulus VTI, D                     29.6  cm       ---------    Pulmonary arteries                        Value          Reference  PA pressure, S, DP                (H)     38    mm Hg    <=30    Tricuspid valve                           Value          Reference  Tricuspid regurg peak velocity            276   cm/s     ---------  Tricuspid peak RV-RA gradient             30    mm Hg    ---------    Systemic veins                            Value          Reference  Estimated CVP                             8     mm Hg    ---------    Right ventricle                           Value          Reference  TAPSE                                     17    mm       ---------  RV pressure, S, DP                (H)     38    mm Hg    <=30  RV s&', lateral, S                         6.02  cm/s     ---------  Legend: (L)  and  (H)  mark values outside specified reference range.  ------------------------------------------------------------------- Prepared and Electronically Authenticated by  Shirlee More, MD 2018-10-23T12:29:17    Assessment / Plan:   Stable CT follow-up wedge resection stage I non-small cell lung cancer in September 2018, will repeat CT scan in 6 months Patient to have evaluation of her renal artery stents in the next several weeks Patient is status post mitral valve replacement with tissue valve   [name] 09/03/2018 10:34 AM

## 2018-09-07 ENCOUNTER — Ambulatory Visit (HOSPITAL_COMMUNITY): Payer: Medicare Other

## 2018-09-08 ENCOUNTER — Telehealth (HOSPITAL_COMMUNITY): Payer: Self-pay | Admitting: Rehabilitation

## 2018-09-08 NOTE — Telephone Encounter (Signed)
The above patient or their representative was contacted and gave the following answers to these questions:         Do you have any of the following symptoms? No Fever                    Cough                   Shortness of breath  Do  you have any of the following other symptoms? No  muscle pain         vomiting,        diarrhea        rash         weakness        red eye        abdominal pain         bruising         bleeding              joint pain           severe headache  Have you been in contact with someone who was or has been sick in the past 2 weeks? No Yes                 Unsure                         Unable to assess   Does the person that you were in contact with have any of the following symptoms?  Cough         shortness of breath           muscle pain         vomiting,            diarrhea            rash            weakness           fever            red eye           abdominal pain          bruising  or  bleeding                joint pain                severe headache             Have you  or someone you have been in contact with traveled internationally in the last month?  No      If yes, which countries?  Have you  or someone you have been in contact with traveled outside [place] in the last month?  No      If yes, which state and city?  COMMENTS OR ACTION PLAN FOR THIS PATIENT:    

## 2018-09-09 ENCOUNTER — Ambulatory Visit (HOSPITAL_COMMUNITY)
Admission: RE | Admit: 2018-09-09 | Discharge: 2018-09-09 | Disposition: A | Discharge status: Home / Self Care | Payer: Medicare Other | Source: Ambulatory Visit | Attending: Vascular Surgery | Admitting: Vascular Surgery

## 2018-09-09 DIAGNOSIS — I739 Peripheral vascular disease, unspecified: Secondary | ICD-10-CM | POA: Diagnosis not present

## 2018-09-09 NOTE — Progress Notes (Signed)
Virtual Visit via Video Note   This visit type was conducted due to national recommendations for restrictions regarding the COVID-19 Pandemic (e.g. social distancing) in an effort to limit this patient's exposure and mitigate transmission in our community.  Due to her co-morbid illnesses, this patient is at least at moderate risk for complications without adequate follow up.  This format is felt to be most appropriate for this patient at this time.  All issues noted in this document were discussed and addressed.  A limited physical exam was performed with this format.  Please refer to the patient's chart for her consent to telehealth for [facility].   Date:  09/11/2018   ID:  Leah [name], DOB 1951-03-20, MRN [id number]  Patient Location: Home Provider Location: Office  PCP:  Guadalupe Dawn, MD  Cardiologist:  Peter Martinique, MD  Electrophysiologist:  None   Evaluation Performed:  Follow-Up Visit  Chief Complaint:  Follow up CAD  History of Present Illness:    Leah [name] is a 68 y.o. female with a past medical history of CAD (s/p LIMA-LAD in 11/2014), MVR (in 11/2014 with a pericardial tissue valve), HTN, HLD, COPD, and PVD (s/p L CEA and right external iliac stenting), CHF.  In August 2018 she had pre op evaluation with a Myoview study that showed no ischemic and reduced EF. She had a right upper lobe lung nodule which had increased in size. She underwent resection in September 2018 with pathology showing adenocarcinoma stage 1. Negative margins. Echo performed in October showed EF 40% with normally functioning Mitral valve prosthesis.   She was admitted in March 2019 with CHF exacerbation. Responded to IV diuresis. Continued on other medical therapy. CT of the chest showed no PE. Discharge weight 168 lbs.   On 02/04/18 she underwent LE angiogram for left leg claudication. She had PTA/stenting of the left external iliac artery by Dr. Fortunato Curling. She was also noted  to have an 80% right renal artery stenosis. This was a difficult procedure because she had severe back spasms. Her potassium was also low.   Carotid dopplers on 04/24/18 showed no change in bilateral moderate carotid disease.   On her last visit she had an episode of altered mental status. Not clear if she passed out. Event monitor showed PVCs, bigeminy, and 5 and 7 beat runs of NSVT. No symptoms.   Seen by [facility] recently. Noted increased velocity in left iliac stent. History of left fem-pop BPG. Plan for relook angiogram next week. Also seen by Dr Servando Snare for follow up lung CA. Plan CT chest in 6 months.  She reports she is doing well from a cardiac standpoint. Denies any chest pain. She does have chronic SOB that is unchanged. No increase in edema or weight gain. No palpitations. No dizziness or syncope. Is planning on quitting smoking. Admits she takes lipitor only sporadically.    The patient does not have symptoms concerning for COVID-19 infection (fever, chills, cough, or new shortness of breath).    Past Medical History:  Diagnosis Date  . Anemia   . Bronchogenic lung cancer, right ([place]) 11/29/2016  . CAD (coronary artery disease)    a. s/p LIMA-LAD in 11/2014  . Carotid artery occlusion   . CHF (congestive heart failure) ([place])   . Complication of anesthesia    slow to awaken x 1 maybe 2001  . COPD (chronic obstructive pulmonary disease) ([place])   . GERD (gastroesophageal reflux disease)    "sometimes" takes Alka  Seltzer or drinks gingerale   . Headache(784.0)   . History of kidney stones   . Hyperlipidemia   . Hypertension   . Leg pain   . Peripheral vascular disease ([place])   . Renal vascular disease 10/26/2014   bilateral stents placed   . Severe mitral regurgitation    a. s/p MVR in 11/2014 with a pericardial tissue valve  . Shortness of breath dyspnea   . Tobacco abuse    Past Surgical History:  Procedure Laterality Date  . ABDOMINAL HYSTERECTOMY    . ANGIOPLASTY /  STENTING ILIAC  2010   right external iliac by Dr. Irish Lack  . CARDIAC CATHETERIZATION  11/13/11   Left Heart Cath. with Coronary Angiogram  . CARDIAC CATHETERIZATION N/A 08/24/2014   Procedure: Left Heart Cath and Coronary Angiography;  Surgeon: Wellington Hampshire, MD;  Location: [facility];  Service: Cardiovascular;  Laterality: N/A;  . CARDIAC CATHETERIZATION N/A 10/26/2014   Procedure: Right/Left Heart Cath and Coronary Angiography;  Surgeon: Peter M Martinique, MD; oLAD 70%, mLAD 70% ISR, D2 30%, OFC 30%, RCA 20%, EF nl, low R heart pressures after diuresis, severe MR  . CAROTID ENDARTERECTOMY  11/21/2007   left  . COLONOSCOPY W/ POLYPECTOMY    . CORONARY ANGIOPLASTY WITH STENT PLACEMENT  6/09   LAD 2.5x12 Promus  . CORONARY ARTERY BYPASS GRAFT N/A 12/05/2014   Procedure: CORONARY ARTERY BYPASS GRAFTING (CABG);  Surgeon: [name], MD;  Location: [facility];  Service: Open Heart Surgery;  Laterality: N/A;  Times 1 using left internal mammary artery to LAD  . FEMORAL-POPLITEAL BYPASS GRAFT  10/12   left Dr. Kellie Simmering  . LOWER EXTREMITY ANGIOGRAPHY N/A 02/04/2018   Procedure: LOWER EXTREMITY ANGIOGRAPHY;  Surgeon: Marty Heck, MD;  Location: [facility];  Service: Cardiovascular;  Laterality: N/A;  . MITRAL VALVE REPAIR  2016  . MITRAL VALVE REPLACEMENT N/A 12/05/2014   Procedure: MITRAL VALVE (MV) REPLACEMENT;  Surgeon: [name], MD;  Location: [facility];  Service: Open Heart Surgery;  Laterality: N/A;  Closure left atrial appendage  . PERIPHERAL VASCULAR INTERVENTION Left 02/04/2018   Procedure: PERIPHERAL VASCULAR INTERVENTION;  Surgeon: Marty Heck, MD;  Location: [facility];  Service: Cardiovascular;  Laterality: Left;  external iliac  . RENAL ARTERY STENT Bilateral   . TEE WITHOUT CARDIOVERSION N/A 10/24/2014   Procedure: TRANSESOPHAGEAL ECHOCARDIOGRAM (TEE);  Surgeon: Thayer Headings, MD;  Location: [facility];  Service: Cardiovascular;  Laterality:  N/A;  . TEE WITHOUT CARDIOVERSION N/A 12/05/2014   Procedure: TRANSESOPHAGEAL ECHOCARDIOGRAM (TEE);  Surgeon: [name], MD;  Location: [facility];  Service: Open Heart Surgery;  Laterality: N/A;  . VIDEO ASSISTED THORACOSCOPY (VATS)/WEDGE RESECTION Right 11/29/2016   Procedure: VIDEO ASSISTED THORACOSCOPY (VATS)/ RUL WEDGE RESECTION OF LESION/ NODE SAMPLING;  Surgeon: [name], MD;  Location: [place];  Service: Thoracic;  Laterality: Right;  Marland Kitchen VIDEO BRONCHOSCOPY N/A 11/29/2016   Procedure: VIDEO BRONCHOSCOPY;  Surgeon: [name], MD;  Location: [facility];  Service: Thoracic;  Laterality: N/A;     Current Meds  Medication Sig  . albuterol (PROVENTIL HFA;VENTOLIN HFA) 108 (90 Base) MCG/ACT inhaler Inhale 2 puffs into the lungs every 6 (six) hours as needed for wheezing or shortness of breath.  Marland Kitchen aspirin EC 81 MG tablet Take 1 tablet (81 mg total) by mouth daily.  . clopidogrel (PLAVIX) 75 MG tablet Take 1 tablet (75 mg total) by mouth daily.  Marland Kitchen  cyclobenzaprine (FLEXERIL) 10 MG tablet Take 1 tablet by mouth three times daily as needed for muscle spasm (Patient taking differently: Take 10 mg by mouth 3 (three) times daily as needed for muscle spasms. )  . ezetimibe (ZETIA) 10 MG tablet Take 1 tablet (10 mg total) by mouth daily. (Patient taking differently: Take 10 mg by mouth daily at 3 pm. )  . furosemide (LASIX) 40 MG tablet Take 0.5 tablets (20 mg total) by mouth daily. (Patient taking differently: Take 20 mg by mouth every evening. )  . gabapentin (NEURONTIN) 100 MG capsule TAKE 2 CAPSULES BY MOUTH THREE TIMES DAILY (Patient taking differently: Take 200 mg by mouth 3 (three) times daily. )  . isosorbide-hydrALAZINE (BIDIL) 20-37.5 MG tablet Take 1 tablet by mouth 3 (three) times daily.  . metoprolol succinate (TOPROL-XL) 25 MG 24 hr tablet Take 1 tablet (25 mg total) by mouth daily.  . nitroGLYCERIN (NITROSTAT) 0.4 MG SL tablet Place 1 tablet (0.4 mg total) under the tongue every 5  (five) minutes x 3 doses as needed for chest pain.  . potassium chloride SA (K-DUR,KLOR-CON) 20 MEQ tablet Take 40 mEq by mouth 2 (two) times daily.   . traMADol (ULTRAM) 50 MG tablet TAKE 1 TABLET BY MOUTH EVERY 6 HOURS AS NEEDED FOR PAIN (Patient taking differently: Take 50 mg by mouth every 6 (six) hours as needed for moderate pain. )  . traZODone (DESYREL) 100 MG tablet Take 1 tablet (100 mg total) by mouth at bedtime.     Allergies:   Lisinopril, Chantix [varenicline], and Penicillins   Social History   Tobacco Use  . Smoking status: Current Every Day Smoker    Packs/day: 0.50    Years: 35.00    Pack years: 17.50    Types: Cigarettes  . Smokeless tobacco: Never Used  Substance Use Topics  . Alcohol use: Yes    Alcohol/week: 0.0 standard drinks    Comment: occasion  . Drug use: No     Family Hx: The patient's family history includes ALS in her brother; Cancer in her mother; Heart disease in her father; Hypertension in her brother, father, and mother; Kidney disease in her father; Prostate cancer in her father; Stroke in her paternal aunt. There is no history of Heart attack, Colon cancer, Stomach cancer, Rectal cancer, Esophageal cancer, or Liver cancer.  ROS:   Please see the history of present illness.    All other systems reviewed and are negative.   Prior CV studies:   The following studies were reviewed today:  Myoview 11/20/16: Study Highlights    Nuclear stress EF: 31%. The left ventricular ejection fraction is moderately decreased (30-44%).  Defect 1: There is a medium defect of moderate severity present in the apical anterior, apical septal, apical inferior and apex location.  Findings consistent with prior apical myocardial infarction.  This is a high risk study based on severely reduced LV function . There is no evidence of ischemia   Echo 01/13/17: Study Conclusions  - Left ventricle: The cavity size was normal. Wall thickness was normal. Systolic  function was mildly to moderately reduced. The estimated ejection fraction was 40%. Diffuse hypokinesis. There was a reduced contribution of atrial contraction to ventricular filling, due to increased ventricular diastolic pressure or atrial contractile dysfunction. Doppler parameters are consistent with high ventricular filling pressure. - Aortic valve: There was mild regurgitation. - Mitral valve: A bioprosthesis was present. with normal function. Mean gradient (D): 2 mm Hg. Peak gradient (D):  10 mm Hg. - Left atrium: The atrium was moderately dilated. - Pulmonary arteries: PA peak pressure: 38 mm Hg (S).   Labs/Other Tests and Data Reviewed:    EKG:  No ECG reviewed.  Recent Labs: 04/28/2018: ALT 8; BNP 330.4; BUN 13; Creatinine, Ser 0.93; Hemoglobin 13.0; Platelets 194; Potassium 3.4; Sodium 142   Recent Lipid Panel Lab Results  Component Value Date/Time   CHOL 168 04/28/2018 03:44 PM   TRIG 119 04/28/2018 03:44 PM   HDL 48 04/28/2018 03:44 PM   CHOLHDL 3.5 04/28/2018 03:44 PM   CHOLHDL 5.6 (H) 01/15/2016 11:17 AM   LDLCALC 96 04/28/2018 03:44 PM   LDLDIRECT 185 (A) 09/09/2011 08:20 AM    Wt Readings from Last 3 Encounters:  09/03/18 160 lb (72.6 kg)  09/01/18 160 lb 3.2 oz (72.7 kg)  06/05/18 151 lb (68.5 kg)     Objective:    Vital Signs:  There were no vitals taken for this visit.   VITAL SIGNS:  reviewed  ASSESSMENT & PLAN:    1. CAD - s/p LIMA-LAD in 11/2014.  - no real anginal symptoms. -  Lexiscan Myoview in August 2018 showed no ischemia but reduced EF   - continue ASA, statin, and BB therapy.   2. S/p MVR - performed in in 11/2014 with a pericardial tissue valve. Echo in August 2018 showed a stable prosthesis with no obvious MR.   3. Chronic systolic CHF EF 40%. In review of records she had EF 45-50% post MVR. "normal" EF prior to surgery but I suspect there was latent LV dysfunction in setting of severe MR. Follow up Echo in October  2018 showed EF 40%.  She has stable class 1-2 symptoms. She is not a candidate for ACEi or ARB due to history of angioedema on lisinopril. Continue Bidil 20/37.5 mg tid. and metoprolol. continue current lasix dose. Last BNP improved to 330.   4. HTN - unable to check BP at home  5. HLD - Lipitor has resulted in 50-60% reduction in LDL when she is taking it. Stressed the importance of taking it daily.    6. PVD -  s/p L CEA and left fem pop BPG. Bilateral carotid bruits. Carotid dopplers in January showed moderate bilateral disease. No AAA. S/p  left external iliac stenting November 2019. Marland Kitchen RAS s/p bilateral stenting- now 80% on right and 50% on the left.  - plan for repeat renal and iliac angiography by [facility] next week.  - continue ASA and statin therapy.   7. Tobacco Use - still smoking 0.5 ppd. Encouraged complete smoking cessation  8. Adenocarcinoma lung. S/p resection. Stage 1. No recurrence by CT.  9. Palpitations: PVCs and PACs noted on Ecg.  Take potassium supplement daily.  10. Spell of altered mental state. Difficult to get clear history since patient has no clear recollection of events. No recurrence.    COVID-19 Education: The signs and symptoms of COVID-19 were discussed with the patient and how to seek care for testing (follow up with PCP or arrange E-visit).  The importance of social distancing was discussed today.  Time:   Today, I have spent 15 minutes with the patient with telehealth technology discussing the above problems.     Medication Adjustments/Labs and Tests Ordered: Current medicines are reviewed at length with the patient today.  Concerns regarding medicines are outlined above.   Tests Ordered: No orders of the defined types were placed in this encounter.   Medication Changes: No orders  of the defined types were placed in this encounter.   Follow Up:  In Person in 4 month(s)  Signed, Peter Martinique, MD  09/11/2018 1:49 PM    [facility]

## 2018-09-11 ENCOUNTER — Telehealth (INDEPENDENT_AMBULATORY_CARE_PROVIDER_SITE_OTHER): Payer: Medicare Other | Admitting: Cardiology

## 2018-09-11 ENCOUNTER — Inpatient Hospital Stay (HOSPITAL_COMMUNITY): Admission: RE | Admit: 2018-09-11 | Payer: Medicare Other | Source: Ambulatory Visit

## 2018-09-11 ENCOUNTER — Encounter: Payer: Self-pay | Admitting: Cardiology

## 2018-09-11 DIAGNOSIS — Z720 Tobacco use: Secondary | ICD-10-CM

## 2018-09-11 DIAGNOSIS — I5022 Chronic systolic (congestive) heart failure: Secondary | ICD-10-CM

## 2018-09-11 DIAGNOSIS — Z952 Presence of prosthetic heart valve: Secondary | ICD-10-CM

## 2018-09-11 DIAGNOSIS — E7800 Pure hypercholesterolemia, unspecified: Secondary | ICD-10-CM

## 2018-09-11 DIAGNOSIS — I779 Disorder of arteries and arterioles, unspecified: Secondary | ICD-10-CM

## 2018-09-11 DIAGNOSIS — I2510 Atherosclerotic heart disease of native coronary artery without angina pectoris: Secondary | ICD-10-CM

## 2018-09-11 NOTE — Patient Instructions (Signed)
Medication Instructions:  Continue same medications If you need a refill on your cardiac medications before your next appointment, please call your pharmacy.   Lab work: None ordered   Testing/Procedures: None ordered  Follow-Up: At [facility], you and your health needs are our priority.  As part of our continuing mission to provide you with exceptional heart care, we have created designated Provider Care Teams.  These Care Teams include your primary Cardiologist (physician) and Advanced Practice Providers (APPs -  Physician Assistants and Nurse Practitioners) who all work together to provide you with the care you need, when you need it. . Schedule follow up appointment in 4 months  Call in July to schedule Oct appointment

## 2018-09-12 ENCOUNTER — Other Ambulatory Visit (HOSPITAL_COMMUNITY)
Admission: RE | Admit: 2018-09-12 | Discharge: 2018-09-12 | Disposition: A | Discharge status: Home / Self Care | Payer: Medicare Other | Source: Ambulatory Visit | Attending: Vascular Surgery | Admitting: Vascular Surgery

## 2018-09-12 DIAGNOSIS — Z1159 Encounter for screening for other viral diseases: Secondary | ICD-10-CM | POA: Insufficient documentation

## 2018-09-13 LAB — SARS CORONAVIRUS 2 (TAT 6-24 HRS): SARS Coronavirus 2: NEGATIVE

## 2018-09-16 ENCOUNTER — Other Ambulatory Visit: Payer: Self-pay | Admitting: Vascular Surgery

## 2018-09-16 ENCOUNTER — Encounter (HOSPITAL_COMMUNITY): Payer: Self-pay | Admitting: Vascular Surgery

## 2018-09-16 ENCOUNTER — Other Ambulatory Visit: Payer: Self-pay

## 2018-09-16 ENCOUNTER — Ambulatory Visit (HOSPITAL_COMMUNITY)
Admission: RE | Admit: 2018-09-16 | Discharge: 2018-09-16 | Disposition: A | Discharge status: Home / Self Care | Payer: Medicare Other | Attending: Vascular Surgery | Admitting: Vascular Surgery

## 2018-09-16 ENCOUNTER — Encounter (HOSPITAL_COMMUNITY)
Admission: RE | Disposition: A | Discharge status: Home / Self Care | Payer: Self-pay | Source: Home / Self Care | Attending: Vascular Surgery

## 2018-09-16 DIAGNOSIS — Z95820 Peripheral vascular angioplasty status with implants and grafts: Secondary | ICD-10-CM | POA: Insufficient documentation

## 2018-09-16 DIAGNOSIS — I701 Atherosclerosis of renal artery: Secondary | ICD-10-CM | POA: Diagnosis not present

## 2018-09-16 DIAGNOSIS — Z888 Allergy status to other drugs, medicaments and biological substances status: Secondary | ICD-10-CM | POA: Insufficient documentation

## 2018-09-16 DIAGNOSIS — Z90710 Acquired absence of both cervix and uterus: Secondary | ICD-10-CM | POA: Insufficient documentation

## 2018-09-16 DIAGNOSIS — E785 Hyperlipidemia, unspecified: Secondary | ICD-10-CM | POA: Diagnosis not present

## 2018-09-16 DIAGNOSIS — Z7902 Long term (current) use of antithrombotics/antiplatelets: Secondary | ICD-10-CM | POA: Insufficient documentation

## 2018-09-16 DIAGNOSIS — T82858A Stenosis of vascular prosthetic devices, implants and grafts, initial encounter: Secondary | ICD-10-CM | POA: Insufficient documentation

## 2018-09-16 DIAGNOSIS — J449 Chronic obstructive pulmonary disease, unspecified: Secondary | ICD-10-CM | POA: Insufficient documentation

## 2018-09-16 DIAGNOSIS — Z951 Presence of aortocoronary bypass graft: Secondary | ICD-10-CM | POA: Insufficient documentation

## 2018-09-16 DIAGNOSIS — I509 Heart failure, unspecified: Secondary | ICD-10-CM | POA: Diagnosis not present

## 2018-09-16 DIAGNOSIS — I2510 Atherosclerotic heart disease of native coronary artery without angina pectoris: Secondary | ICD-10-CM | POA: Insufficient documentation

## 2018-09-16 DIAGNOSIS — Z952 Presence of prosthetic heart valve: Secondary | ICD-10-CM | POA: Diagnosis not present

## 2018-09-16 DIAGNOSIS — F17210 Nicotine dependence, cigarettes, uncomplicated: Secondary | ICD-10-CM | POA: Insufficient documentation

## 2018-09-16 DIAGNOSIS — I739 Peripheral vascular disease, unspecified: Secondary | ICD-10-CM | POA: Diagnosis not present

## 2018-09-16 DIAGNOSIS — Z7982 Long term (current) use of aspirin: Secondary | ICD-10-CM | POA: Diagnosis not present

## 2018-09-16 DIAGNOSIS — I340 Nonrheumatic mitral (valve) insufficiency: Secondary | ICD-10-CM | POA: Insufficient documentation

## 2018-09-16 DIAGNOSIS — I110 Hypertensive heart disease with heart failure: Secondary | ICD-10-CM | POA: Diagnosis not present

## 2018-09-16 DIAGNOSIS — Z880 Allergy status to penicillin: Secondary | ICD-10-CM | POA: Insufficient documentation

## 2018-09-16 DIAGNOSIS — Z955 Presence of coronary angioplasty implant and graft: Secondary | ICD-10-CM | POA: Diagnosis not present

## 2018-09-16 DIAGNOSIS — Y831 Surgical operation with implant of artificial internal device as the cause of abnormal reaction of the patient, or of later complication, without mention of misadventure at the time of the procedure: Secondary | ICD-10-CM | POA: Insufficient documentation

## 2018-09-16 DIAGNOSIS — Z8249 Family history of ischemic heart disease and other diseases of the circulatory system: Secondary | ICD-10-CM | POA: Insufficient documentation

## 2018-09-16 DIAGNOSIS — Z823 Family history of stroke: Secondary | ICD-10-CM | POA: Diagnosis not present

## 2018-09-16 DIAGNOSIS — K219 Gastro-esophageal reflux disease without esophagitis: Secondary | ICD-10-CM | POA: Insufficient documentation

## 2018-09-16 DIAGNOSIS — Z79899 Other long term (current) drug therapy: Secondary | ICD-10-CM | POA: Insufficient documentation

## 2018-09-16 DIAGNOSIS — I70212 Atherosclerosis of native arteries of extremities with intermittent claudication, left leg: Secondary | ICD-10-CM | POA: Diagnosis not present

## 2018-09-16 HISTORY — PX: PERIPHERAL VASCULAR BALLOON ANGIOPLASTY: CATH118281

## 2018-09-16 HISTORY — PX: ABDOMINAL AORTOGRAM W/LOWER EXTREMITY: CATH118223

## 2018-09-16 HISTORY — PX: RENAL ANGIOGRAPHY: CATH118260

## 2018-09-16 LAB — POCT I-STAT, CHEM 8
BUN: 14 mg/dL (ref 8–23)
Calcium, Ion: 1.23 mmol/L (ref 1.15–1.40)
Chloride: 109 mmol/L (ref 98–111)
Creatinine, Ser: 1.1 mg/dL — ABNORMAL HIGH (ref 0.44–1.00)
Glucose, Bld: 107 mg/dL — ABNORMAL HIGH (ref 70–99)
HCT: 45 % (ref 36.0–46.0)
Hemoglobin: 15.3 g/dL — ABNORMAL HIGH (ref 12.0–15.0)
Potassium: 3.8 mmol/L (ref 3.5–5.1)
Sodium: 140 mmol/L (ref 135–145)
TCO2: 24 mmol/L (ref 22–32)

## 2018-09-16 SURGERY — ABDOMINAL AORTOGRAM W/LOWER EXTREMITY
Anesthesia: LOCAL | Laterality: Left

## 2018-09-16 MED ORDER — SODIUM CHLORIDE 0.9 % IV SOLN
INTRAVENOUS | Status: DC
  Administered 2018-09-16: 07:00:00 via INTRAVENOUS

## 2018-09-16 MED ORDER — ONDANSETRON HCL 4 MG PO TABS: 4.0000 mg | ORAL_TABLET | Freq: Three times a day (TID) | ORAL | 0 refills | Status: DC | PRN

## 2018-09-16 MED ORDER — FENTANYL CITRATE (PF) 100 MCG/2ML IJ SOLN
INTRAMUSCULAR | Status: AC
  Filled 2018-09-16: qty 2

## 2018-09-16 MED ORDER — HEPARIN (PORCINE) IN NACL 1000-0.9 UT/500ML-% IV SOLN
INTRAVENOUS | Status: DC | PRN
  Administered 2018-09-16 (×2): 500 mL

## 2018-09-16 MED ORDER — ONDANSETRON HCL 4 MG/2ML IJ SOLN: 4.0000 mg | Freq: Four times a day (QID) | INTRAMUSCULAR | Status: DC | PRN

## 2018-09-16 MED ORDER — SODIUM CHLORIDE 0.9% FLUSH: 3.0000 mL | INTRAVENOUS | Status: DC | PRN

## 2018-09-16 MED ORDER — SODIUM CHLORIDE 0.9 % IV SOLN: 250.0000 mL | INTRAVENOUS | Status: DC | PRN

## 2018-09-16 MED ORDER — HYDRALAZINE HCL 20 MG/ML IJ SOLN: 5.0000 mg | INTRAMUSCULAR | Status: DC | PRN

## 2018-09-16 MED ORDER — MIDAZOLAM HCL 2 MG/2ML IJ SOLN
INTRAMUSCULAR | Status: AC
  Filled 2018-09-16: qty 2

## 2018-09-16 MED ORDER — ACETAMINOPHEN 325 MG PO TABS: 650.0000 mg | ORAL_TABLET | ORAL | Status: DC | PRN

## 2018-09-16 MED ORDER — LIDOCAINE HCL (PF) 1 % IJ SOLN
INTRAMUSCULAR | Status: AC
  Filled 2018-09-16: qty 30

## 2018-09-16 MED ORDER — HEPARIN (PORCINE) IN NACL 1000-0.9 UT/500ML-% IV SOLN
INTRAVENOUS | Status: AC
  Filled 2018-09-16: qty 1000

## 2018-09-16 MED ORDER — SODIUM CHLORIDE 0.9 % WEIGHT BASED INFUSION: 1.0000 mL/kg/h | INTRAVENOUS | Status: DC

## 2018-09-16 MED ORDER — HEPARIN SODIUM (PORCINE) 1000 UNIT/ML IJ SOLN
INTRAMUSCULAR | Status: DC | PRN
  Administered 2018-09-16: 7000 [IU] via INTRAVENOUS

## 2018-09-16 MED ORDER — HEPARIN SODIUM (PORCINE) 1000 UNIT/ML IJ SOLN
INTRAMUSCULAR | Status: AC
  Filled 2018-09-16: qty 1

## 2018-09-16 MED ORDER — MIDAZOLAM HCL 2 MG/2ML IJ SOLN
INTRAMUSCULAR | Status: DC | PRN
  Administered 2018-09-16 (×2): 1 mg via INTRAVENOUS

## 2018-09-16 MED ORDER — FENTANYL CITRATE (PF) 100 MCG/2ML IJ SOLN
INTRAMUSCULAR | Status: DC | PRN
  Administered 2018-09-16 (×4): 25 ug via INTRAVENOUS

## 2018-09-16 MED ORDER — LABETALOL HCL 5 MG/ML IV SOLN
10.0000 mg | INTRAVENOUS | Status: DC | PRN
  Administered 2018-09-16: 10 mg via INTRAVENOUS

## 2018-09-16 MED ORDER — LIDOCAINE HCL (PF) 1 % IJ SOLN
INTRAMUSCULAR | Status: DC | PRN
  Administered 2018-09-16: 15 mL

## 2018-09-16 MED ORDER — METOPROLOL TARTRATE 5 MG/5ML IV SOLN
INTRAVENOUS | Status: AC
  Filled 2018-09-16: qty 5

## 2018-09-16 MED ORDER — METOPROLOL TARTRATE 5 MG/5ML IV SOLN
INTRAVENOUS | Status: DC | PRN
  Administered 2018-09-16: 3 mg via INTRAVENOUS

## 2018-09-16 MED ORDER — LABETALOL HCL 5 MG/ML IV SOLN
INTRAVENOUS | Status: AC
  Filled 2018-09-16: qty 4

## 2018-09-16 MED ORDER — IODIXANOL 320 MG/ML IV SOLN
INTRAVENOUS | Status: DC | PRN
  Administered 2018-09-16: 105 mL via INTRA_ARTERIAL

## 2018-09-16 MED ORDER — SODIUM CHLORIDE 0.9% FLUSH: 3.0000 mL | Freq: Two times a day (BID) | INTRAVENOUS | Status: DC

## 2018-09-16 SURGICAL SUPPLY — 30 items
BALLN LUTONIX DCB 6X40X130 (BALLOONS) ×3
BALLN MUSTANG 6.0X20 75 (BALLOONS) ×3
BALLN VIATRAC 4X15X135 (BALLOONS) ×3
BALLN VIATRAC 5X15X135 (BALLOONS) ×3
BALLN VIATRAC 6X15X135 (BALLOONS) ×3
BALLOON LUTONIX DCB 6X40X130 (BALLOONS) ×2 IMPLANT
BALLOON MUSTANG 6.0X20 75 (BALLOONS) ×2 IMPLANT
BALLOON VIATRAC 4X15X135 (BALLOONS) ×2 IMPLANT
BALLOON VIATRAC 5X15X135 (BALLOONS) ×2 IMPLANT
BALLOON VIATRAC 6X15X135 (BALLOONS) ×2 IMPLANT
CATH OMNI FLUSH 5F 65CM (CATHETERS) ×3 IMPLANT
CATH SOS OMNI O 5F 80CM (CATHETERS) ×3 IMPLANT
CLOSURE MYNX CONTROL 6F/7F (Vascular Products) ×3 IMPLANT
GUIDE CATH VISTA IMA 6F (CATHETERS) ×3 IMPLANT
KIT ENCORE 26 ADVANTAGE (KITS) ×3 IMPLANT
KIT HEART LEFT (KITS) ×3 IMPLANT
KIT MICROPUNCTURE NIT STIFF (SHEATH) ×3 IMPLANT
SHEATH FLEX ANSEL ANG 6F 45CM (SHEATH) ×3 IMPLANT
SHEATH PINNACLE 5F 10CM (SHEATH) IMPLANT
SHEATH PINNACLE 6F 10CM (SHEATH) ×3 IMPLANT
SHEATH PROBE COVER 6X72 (BAG) ×3 IMPLANT
STOPCOCK MORSE 400PSI 3WAY (MISCELLANEOUS) ×3 IMPLANT
SYR MEDRAD MARK V 150ML (SYRINGE) ×3 IMPLANT
TRANSDUCER W/STOPCOCK (MISCELLANEOUS) ×3 IMPLANT
TRAY PV CATH (CUSTOM PROCEDURE TRAY) ×3 IMPLANT
TUBE CONN 8.8X1320 FR HP M-F (CONNECTOR) ×3 IMPLANT
TUBING CIL FLEX 10 FLL-RA (TUBING) ×3 IMPLANT
WIRE BENTSON .035X145CM (WIRE) ×3 IMPLANT
WIRE ROSEN-J .035X260CM (WIRE) ×3 IMPLANT
WIRE STABILIZER XS .014X180CM (WIRE) ×6 IMPLANT

## 2018-09-16 NOTE — Discharge Instructions (Signed)
Femoral Site Care This sheet gives you information about how to care for yourself after your procedure. Your health care provider may also give you more specific instructions. If you have problems or questions, contact your health care provider. What can I expect after the procedure? After the procedure, it is common to have:  Bruising that usually fades within 1-2 weeks.  Tenderness at the site. Follow these instructions at home: Wound care  Follow instructions from your health care provider about how to take care of your insertion site. Make sure you: ? Wash your hands with soap and water before you change your bandage (dressing). If soap and water are not available, use hand sanitizer. ? Remove your dressing as told by your health care provider. In 24-48 hours ? Leave stitches (sutures), skin glue, or adhesive strips in place. These skin closures may need to stay in place for 2 weeks or longer. If adhesive strip edges start to loosen and curl up, you may trim the loose edges. Do not remove adhesive strips completely unless your health care provider tells you to do that.  Do not take baths, swim, or use a hot tub until your health care provider approves.  You may shower 24-48 hours after the procedure or as told by your health care provider. ? Gently wash the site with plain soap and water. ? Pat the area dry with a clean towel. ? Do not rub the site. This may cause bleeding.  Do not apply powder or lotion to the site. Keep the site clean and dry.  Check your femoral site every day for signs of infection. Check for: ? Redness, swelling, or pain. ? Fluid or blood. ? Warmth. ? Pus or a bad smell. Activity  For the first 2-3 days after your procedure, or as long as directed: ? Avoid climbing stairs as much as possible. ? Do not squat.  Do not lift anything that is heavier than 10 lb (4.5 kg), or the limit that you are told, until your health care provider says that it is safe. For  5 days  Rest as directed. ? Avoid sitting for a long time without moving. Get up to take short walks every 1-2 hours.  Do not drive for 24 hours if you were given a medicine to help you relax (sedative). General instructions  Take over-the-counter and prescription medicines only as told by your health care provider.  Keep all follow-up visits as told by your health care provider. This is important. Contact a health care provider if you have:  A fever or chills.  You have redness, swelling, or pain around your insertion site. Get help right away if:  The catheter insertion area swells very fast.  You pass out.  You suddenly start to sweat or your skin gets clammy.  The catheter insertion area is bleeding, and the bleeding does not stop when you hold steady pressure on the area.  The area near or just beyond the catheter insertion site becomes pale, cool, tingly, or numb. These symptoms may represent a serious problem that is an emergency. Do not wait to see if the symptoms will go away. Get medical help right away. Call your local emergency services (911 in the [place] Do not drive yourself to the hospital. Summary  After the procedure, it is common to have bruising that usually fades within 1-2 weeks.  Check your femoral site every day for signs of infection.  Do not lift anything that is heavier than  10 lb (4.5 kg), or the limit that you are told, until your health care provider says that it is safe. This information is not intended to replace advice given to you by your health care provider. Make sure you discuss any questions you have with your health care provider. Document Released: 11/12/2013 Document Revised: 03/24/2017 Document Reviewed: 03/24/2017 Elsevier Interactive Patient Education  2019 [employer].

## 2018-09-16 NOTE — Telephone Encounter (Signed)
Patient son called to report nausea and vomiting when she got home after the procedure.  Underwent bilateral renal artery angioplasty and left iliac intervention.  Reports that the groin site is clean dry and intact with no bleeding.  No back pain.  Prescription for Zofran to her pharmacy.  Not sure if this is a reaction to the sedation medications.  We will continue to monitor and discussed if worsening symptoms could come to the ED for evaluation.  Marty Heck, MD [facility] Office: [phone] Pager: [place]

## 2018-09-16 NOTE — Op Note (Signed)
Patient name: Leah [name] MRN: [id number] DOB: 1950-07-22 Sex: female  09/16/2018 Pre-operative Diagnosis:  1.  Bilateral renal artery in-stent restenosis greater than 70% 2.  Left external iliac artery in-stent restenosis greater than 70% Post-operative diagnosis:  Same Surgeon:  Marty Heck, MD Procedure Performed: 1.  Ultrasound-guided access of the right common femoral artery 2.  Aortogram with bilateral renal artery arteriogram 3.  Right renal artery angioplasty (5 mm x 15 mm Viatrac and 6 mm x 15 mm Viatrac) 4.  Left renal artery angioplasty (4 mm x 15 mm Viatrac and 6 mm x 54mm Viatrac) 5.  Left external iliac artery angioplasty (6 mm x 20 mm Mustang and 6 mm x 40 mm drug-coated Lutonix) 6.  Limited left lower extremity arteriogram with selection of second-order branches 7.  Mynx closure of the right common femoral artery  8.  110 minutes of monitored moderate conscious sedation time  Indications: Patient is a 68 year old female well-known to the vascular surgery service.  She was recently seen for follow-up surveillance after left iliac intervention for recurrent claudication in the setting of a left femoral to below-knee popliteal bypass in 2012.  Ultimately on surveillance imaging she had evidence of a greater than 70% stenosis just proximal to her left external iliac stent.  In addition she has bilateral renal artery stents that were placed in 2012 with evidence of >70% stenosis in both stents.  She presents today for a bilateral renal artery intervention in addition to left iliac intervention after risks and benefits were discussed.  Findings:  Aortogram showed a greater than 95% proximal stenosis in the right renal artery stent that was a 6 mm x 12 mm Herculink.  The left renal artery stent had approximately 70% stenosis and this was a 6 mm x 15 mm Herculink.  I initially ballooned the right renal artery stent with a 5 mm and then a 6 mm angioplasty balloon and  there is now less than 10 to 20% residual stenosis.  On the left I could not get a 6 mm  to track and had to use initially a 4 mm ballon and then upsized this to a 6 mm angioplasty balloon.  There was approximately 30% residual stenosis.  Both renal stents and renal arteries remained patent at the end of the case with no dissections or other issues on completion aortogram. Left external iliac stent had a flow-limiting stenosis in its proximal portion at the takeoff of the left hypogastric.  This was treated with a 6 mm angioplasty balloon and then a 6 mm drug-coated Lutonix balloon.  I did not see any reason I needed to extend the stent up into the common iliac artery given the common iliac was widely patent.  I did a limited left lower extremity arteriogram and had preserved runoff down the left femoral to below-knee popliteal bypass. I did a limited right femoral arteriogram just to ensure that I could deploy a mynx closure device in the right common femoral artery.  Also of note the right common iliac stent is patent although it extends above the aortic bifurcation.  Her profunda is the right groinis patent but she has a very small diminuteSFA takeoff.   Procedure:  The patient was identified in the holding area and taken to room 8.  The patient was then placed supine on the table and prepped and draped in the usual sterile fashion.  A time out was called.  Ultrasound was used to evaluate the  right common femoral artery.  It was patent .  A digital ultrasound image was acquired.  A micropuncture needle was used to access the right common femoral artery under ultrasound guidance.  An 018 wire was advanced without resistance and a micropuncture sheath was placed.  The 018 wire was removed and a benson wire was placed.  The micropuncture sheath was exchanged for a 6 french sheath.  An omniflush catheter was advanced over the wire to the level of L-1.  An abdominal angiogram was obtained with pertinent findings  noted above.  Ultimately elected to intervene on the right renal artery in-stent restenosis first given that was the tighter lesion.  I used a 6 [place] IM guide cath through my short 6 French sheath in the right groin with an 014 stabilizer wire and ultimately cannulated the right renal artery stent and wire was able to advance without any resistance.  She was given 7000 units of IV heparin.  Over this platform I then initially used a 5 mm Viatrac balloon and angioplastied the in-stent restenosis with a large amount of waist.  I then upsized to a 6 mm balloon given this is a 6 mm stent and angioplasty to nominal pressure for several minutes.  Hand-injection through the guide cath showed less than 10 to 20% residual stenosis and was very happy with the results.  There is preserved flow distally to the kidney. I then pulled out my wire and guide cath from the right renal artery stent and then went and selected the left renal artery stent again with an attempt at a no touch technique.  Given that the left renal artery stent had more of a downward angle had to use a 5 [place] JR4 catheter within the IM catheter guide cath in order to successfully cannulate the left renal artery stent.  I then took out the 5 French angled catheter and left the IM guide cath in place at the orifice of the stent after it was cannulated.  I attempted to get a 6 mm angioplasty balloon to treat the left renal artery in-stent restenosis but it would not track.  I did use a smaller balloon initially with a 4 mm Viatrac angioplasty balloon within the left renal artery stent and then upsized to a 6 mm balloon and angioplastied to nominal pressure with both.  The balloon kept watermelon seeding but ultimately we were finally able to get the balloon to deploy across the stenosis.  There was approximately 30% residual stenosis but it looked better and there was no evidence of dissection and it was otherwise a very tortuous artery.  I was happy  enough with these results and there was preserved flow distally.  We then turned our attention to the left iliac stent.  A limited left iliac arteriogram was obtained with a right anterior oblique and we saw that the flow-limiting stenosis was in the proximal left external iliac stent.  I then used Omni Flush catheter to cannulate the left common iliac and got my Omni Flush catheter down through the iliac stent with a Bentson wire.  I then exchanged for a Rosen wire and then a long 6 [place] Ansell sheath.  This in-stent restenosis was initially angioplastied with a 6 mm x 20 mm Mustang and then use a 6 mm x 40 mm drug-coated Lutonix.  There was no evidence of residual stenosis and the stent had preserved runoff.  I did do a limited left lower extremity arteriogram after selection of second-order  branch to ensure that my femoral to below-knee popliteal bypass was patent which it was.  At that point in time a final aortogram was obtained with pertinent findings noted above.  A limited right femoral arteriogram was also obtained to ensure that we could safely deployed a mynx.  A mynx closure device was deployed in the right groin after exchanged for a short 6 French sheath and manual pressure was held.  She was taken to PACU in stable condition.  Complications: None  Condition: Stable   Marty Heck, MD [facility] [place] Office: [phone] Pager: [place]

## 2018-09-16 NOTE — H&P (Signed)
History and Physical Interval Note:  09/16/2018 8:25 AM  Leah [name]  has presented today for surgery, with the diagnosis of PVD.  The various methods of treatment have been discussed with the patient and family. After consideration of risks, benefits and other options for treatment, the patient has consented to  Procedure(s): ABDOMINAL AORTOGRAM W/LOWER EXTREMITY (Bilateral) RENAL ANGIOGRAPHY (Bilateral) as a surgical intervention.  The patient's history has been reviewed, patient examined, no change in status, stable for surgery.  I have reviewed the patient's chart and labs.  Questions were answered to the patient's satisfaction.    Possible renal intervention for in-stent restenosis and left iliac intervention for high grade stenosis.  Leah [name]  Patient name: Leah [name] MRN: [id number] DOB: 1950/04/08 Sex: female  REASON FOR CONSULT: Follow-up L EIA stent with threatened bypass  HPI:  Leah [name] is a 68 y.o. female with multiple medical comorbidities including coronary artery disease status post CABG and mitral valve replacement, heart failure, COPD, tobacco abuse, hypertension, hyperlipidemia, bilateral renal stents for renal artery stenosis, and history of claudication with left common femoral to below-knee pop bypass in 2012 by Dr. Kellie Simmering that presents for interval 3 month follow-up to discuss elevated velocity proximal to L EIA stent placed 02/04/18 by myself. She states her legs overall are doing well. She has noticed walking very very long distances she does get some claudication symptoms in the left calf. She continues to smoke and states with a COVID issue is been difficult to stop. Also of note she underwent bilateral renal artery stents by cardiology and 2012. She has a 6 x 12 Herculink on the right and a 6 x 15 Herculink on the left. There was evidence of bilateral renal artery stenosis on her last aortogram in November. Taking aspirin and Plavix.       Past Medical History:  Diagnosis Date  . Anemia   . Bronchogenic lung cancer, right ([place]) 11/29/2016  . CAD (coronary artery disease)    a. s/p LIMA-LAD in 11/2014  . Carotid artery occlusion   . CHF (congestive heart failure) ([place])   . Complication of anesthesia    slow to awaken x 1 maybe 2001  . COPD (chronic obstructive pulmonary disease) ([place])   . GERD (gastroesophageal reflux disease)    "sometimes" takes Copywriter, advertising or drinks gingerale   . Headache(784.0)   . History of kidney [name]   . Hyperlipidemia   . Hypertension   . Leg pain   . Peripheral vascular disease ([place])   . Renal vascular disease 10/26/2014   bilateral stents placed   . Severe mitral regurgitation    a. s/p MVR in 11/2014 with a pericardial tissue valve  . Shortness of breath dyspnea   . Tobacco abuse         Past Surgical History:  Procedure Laterality Date  . ABDOMINAL HYSTERECTOMY    . ANGIOPLASTY / STENTING ILIAC  2010   right external iliac by Dr. Irish Lack  . CARDIAC CATHETERIZATION  11/13/11   Left Heart Cath. with Coronary Angiogram  . CARDIAC CATHETERIZATION N/A 08/24/2014   Procedure: Left Heart Cath and Coronary Angiography; Surgeon: Wellington Hampshire, MD; Location: [facility]; Service: Cardiovascular; Laterality: N/A;  . CARDIAC CATHETERIZATION N/A 10/26/2014   Procedure: Right/Left Heart Cath and Coronary Angiography; Surgeon: Peter M Martinique, MD; oLAD 70%, mLAD 70% ISR, D2 30%, OFC 30%, RCA 20%, EF nl, low R heart pressures after diuresis, severe MR  .  CAROTID ENDARTERECTOMY  11/21/2007   left  . COLONOSCOPY W/ POLYPECTOMY    . CORONARY ANGIOPLASTY WITH STENT PLACEMENT  6/09   LAD 2.5x12 Prom[place]  . CORONARY ARTERY BYPASS GRAFT N/A 12/05/2014   Procedure: CORONARY ARTERY BYPASS GRAFTING (CABG); Surgeon: [name], MD; Location: [facility]; Service: Open Heart Surgery; Laterality: N/A; Times 1 using left internal mammary artery to LAD  . FEMORAL-POPLITEAL BYPASS GRAFT  10/12   left Dr.  Kellie Simmering  . LOWER EXTREMITY ANGIOGRAPHY N/A 02/04/2018   Procedure: LOWER EXTREMITY ANGIOGRAPHY; Surgeon: Leah Heck, MD; Location: [facility]; Service: Cardiovascular; Laterality: N/A;  . MITRAL VALVE REPAIR  2016  . MITRAL VALVE REPLACEMENT N/A 12/05/2014   Procedure: MITRAL VALVE (MV) REPLACEMENT; Surgeon: [name], MD; Location: [facility]; Service: Open Heart Surgery; Laterality: N/A; Closure left atrial appendage  . PERIPHERAL VASCULAR INTERVENTION Left 02/04/2018   Procedure: PERIPHERAL VASCULAR INTERVENTION; Surgeon: Leah Heck, MD; Location: [facility]; Service: Cardiovascular; Laterality: Left; external iliac  . RENAL ARTERY STENT Bilateral   . TEE WITHOUT CARDIOVERSION N/A 10/24/2014   Procedure: TRANSESOPHAGEAL ECHOCARDIOGRAM (TEE); Surgeon: Thayer Headings, MD; Location: [facility]; Service: Cardiovascular; Laterality: N/A;  . TEE WITHOUT CARDIOVERSION N/A 12/05/2014   Procedure: TRANSESOPHAGEAL ECHOCARDIOGRAM (TEE); Surgeon: [name], MD; Location: [facility]; Service: Open Heart Surgery; Laterality: N/A;  . VIDEO ASSISTED THORACOSCOPY (VATS)/WEDGE RESECTION Right 11/29/2016   Procedure: VIDEO ASSISTED THORACOSCOPY (VATS)/ RUL WEDGE RESECTION OF LESION/ NODE SAMPLING; Surgeon: [name], MD; Location: [facility]; Service: Thoracic; Laterality: Right;  Marland Kitchen VIDEO BRONCHOSCOPY N/A 11/29/2016   Procedure: VIDEO BRONCHOSCOPY; Surgeon: [name], MD; Location: [facility] OR; Service: Thoracic; Laterality: N/A;        Family History  Problem Relation Age of Onset  . Cancer Mother    BRAIN AND LUNG  . Hypertension Mother   . Hypertension Father   . Heart disease Father   . Prostate cancer Father   . Kidney disease Father   . Hypertension Brother   . ALS Brother   . Stroke Paternal Aunt    great aunt  . Heart attack Neg Hx   . Colon cancer Neg Hx   . Stomach cancer Neg Hx   . Rectal cancer Neg Hx   . Esophageal cancer Neg Hx   . Liver  cancer Neg Hx   SOCIAL HISTORY:  Social History        Socioeconomic History  . Marital status: Divorced    Spouse name: Not on file  . Number of children: 4  . Years of education: Not on file  . Highest education level: Not on file  Occupational History  . Occupation: retired  Scientific laboratory technician  . Financial resource strain: Not on file  . Food insecurity:    Worry: Not on file    Inability: Not on file  . Transportation needs:    Medical: Not on file    Non-medical: Not on file  Tobacco Use  . Smoking status: Current Every Day Smoker    Packs/day: 0.50    Years: 35.00    Pack years: 17.50    Types: Cigarettes  . Smokeless tobacco: Never Used  Substance and Sexual Activity  . Alcohol use: Yes    Alcohol/week: 0.0 standard drinks    Comment: occasion  . Drug use: No  . Sexual activity: Never  Lifestyle  . Physical activity:    Days per week: Not on file  Minutes per session: Not on file  . Stress: Not on file  Relationships  . Social connections:    Talks on phone: Not on file    Gets together: Not on file    Attends religious service: Not on file    Active member of club or organization: Not on file    Attends meetings of clubs or organizations: Not on file    Relationship status: Not on file  . Intimate partner violence:    Fear of current or ex partner: Not on file    Emotionally abused: Not on file    Physically abused: Not on file    Forced sexual activity: Not on file  Other Topics Concern  . Not on file  Social History Narrative  . Not on file        Allergies  Allergen Reactions  . Lisinopril Swelling    Angioedema 06/10/11  . Chantix [Varenicline] Other (See Comments)    Caused insomnia  . Penicillins Hives    Has patient had a PCN reaction causing immediate rash, facial/tongue/throat swelling, SOB or lightheadedness with hypotension: Yes  Has patient had a PCN reaction causing severe rash involving mucus membranes or skin necrosis: No  Has  patient had a PCN reaction that required hospitalization: No  Has patient had a PCN reaction occurring within the last 10 years: No  If all of the above answers are "NO", then may proceed with Cephalosporin use.          Current Outpatient Medications  Medication Sig Dispense Refill  . albuterol (PROVENTIL HFA;VENTOLIN HFA) 108 (90 Base) MCG/ACT inhaler Inhale 2 puffs into the lungs every 6 (six) hours as needed for wheezing or shortness of breath. 1 Inhaler 0  . aspirin EC 81 MG tablet Take 1 tablet (81 mg total) by mouth daily. 90 tablet 1  . clopidogrel (PLAVIX) 75 MG tablet Take 1 tablet (75 mg total) by mouth daily. 30 tablet 11  . cyclobenzaprine (FLEXERIL) 10 MG tablet Take 1 tablet by mouth three times daily as needed for muscle spasm 180 tablet 0  . diclofenac sodium (VOLTAREN) 1 % GEL Apply 4 g topically 4 (four) times daily. (Patient taking differently: Apply 4 g topically 4 (four) times daily as needed (for back pain.). ) 100 g 0  . ezetimibe (ZETIA) 10 MG tablet Take 1 tablet (10 mg total) by mouth daily. 90 tablet 3  . furosemide (LASIX) 40 MG tablet Take 0.5 tablets (20 mg total) by mouth daily. 30 tablet 6  . gabapentin (NEURONTIN) 100 MG capsule TAKE 2 CAPSULES BY MOUTH THREE TIMES DAILY 120 capsule 0  . isosorbide-hydrALAZINE (BIDIL) 20-37.5 MG tablet Take 1 tablet by mouth 3 (three) times daily. 90 tablet 11  . metoprolol succinate (TOPROL-XL) 25 MG 24 hr tablet Take 1 tablet (25 mg total) by mouth daily. 90 tablet 3  . nitroGLYCERIN (NITROSTAT) 0.4 MG SL tablet Place 1 tablet (0.4 mg total) under the tongue every 5 (five) minutes x 3 doses as needed for chest pain. 25 tablet 12  . potassium chloride SA (K-DUR,KLOR-CON) 20 MEQ tablet Take 2 tablets ( 40 meq ) twice a day 90 tablet 3  . traMADol (ULTRAM) 50 MG tablet TAKE 1 TABLET BY MOUTH EVERY 6 HOURS AS NEEDED FOR PAIN 90 tablet 0  . traZODone (DESYREL) 100 MG tablet Take 1 tablet (100 mg total) by mouth at bedtime. 90 tablet  0  . atorvastatin (LIPITOR) 80 MG tablet Take 1 tablet (  80 mg total) by mouth daily. 30 tablet 6   No current facility-administered medications for this visit.   REVIEW OF SYSTEMS:  [X]  denotes positive finding, [ ]  denotes negative finding  Cardiac  Comments:  Chest pain or chest pressure:    Shortness of breath upon exertion:    Short of breath when lying flat:    Irregular heart rhythm:        Vascular    Pain in calf, thigh, or hip brought on by ambulation: x   Pain in feet at night that wakes you up from your sleep:     Blood clot in your veins:    Leg swelling:         Pulmonary    Oxygen at home:    Productive cough:     Wheezing:         Neurologic    Sudden weakness in arms or legs:     Sudden numbness in arms or legs:     Sudden onset of difficulty speaking or slurred speech:    Temporary loss of vision in one eye:     Problems with dizziness:         Gastrointestinal    Blood in stool:     Vomited blood:         Genitourinary    Burning when urinating:     Blood in urine:        Psychiatric    Major depression:         Hematologic    Bleeding problems:    Problems with blood clotting too easily:        Skin    Rashes or ulcers:        Constitutional    Fever or chills:    PHYSICAL EXAM:     Vitals:   09/01/18 0937  BP: (!) 157/74  Pulse: 63  Temp: 98.1 F (36.7 C)  TempSrc: Temporal  SpO2: 99%  Weight: 160 lb 3.2 oz (72.7 kg)  Height: 6\' 1"  (1.854 m)  GENERAL: The patient is a well-nourished female, in no acute distress. The vital signs are documented above.  CARDIAC: There is a regular rate and rhythm.  VASCULAR:  2+ femoral pulse palpable bilateral groins  Left Pt weakly palpable  PULMONARY: There is good air exchange bilaterally without wheezing or rales.  ABDOMEN: Soft and non-tender with normal pitched bowel sounds.  MUSCULOSKELETAL: There are no major deformities or cyanosis.  NEUROLOGIC: No focal weakness or paresthesias are  detected.  DATA:  ABI's 0.58 right, 1.04 left, elevated velocity 420 proximal to L EIA stent.  Assessment/Plan:  68 year old female with multiple medical comorbidities that previously underwent left common femoral to below-knee popliteal bypass in 2012 for claudication by Dr. Kellie Simmering.. She had recurrent claudication symptoms and then underwent left external iliac stent by myself on 02/04/2018 with a threatened bypass. She since recovered well. She was last seen by the nurse practitioner and scheduled see me at 45-month follow-up. Her velocity proximal to the left external iliac stent continues to elevate now 400. I recommended we return for aortogram possible left iliac intervention given that this iliac artery stent serves as inflow to her bypass. I think if her stent goes down it could be problematic. In addition we will get a renal artery duplex to evaluate her renal stents since these look stenotic on her last aortogram and we could also potentially intervene on one or both stents if necessary. I discussed all  this with the patient. Discussed the importance of smoking cessation given that I think this is largely contributing factor to all of her in-stent restenosis.   Leah Heck, MD  [facility]  Office: [phone]  Pager: [place]

## 2018-09-17 ENCOUNTER — Encounter (HOSPITAL_COMMUNITY): Payer: Self-pay | Admitting: Vascular Surgery

## 2018-09-23 ENCOUNTER — Other Ambulatory Visit: Payer: Self-pay | Admitting: Family Medicine

## 2018-09-23 DIAGNOSIS — G8918 Other acute postprocedural pain: Secondary | ICD-10-CM

## 2018-09-23 DIAGNOSIS — G8929 Other chronic pain: Secondary | ICD-10-CM

## 2018-10-13 ENCOUNTER — Other Ambulatory Visit: Payer: Self-pay

## 2018-10-13 DIAGNOSIS — I739 Peripheral vascular disease, unspecified: Secondary | ICD-10-CM

## 2018-10-13 DIAGNOSIS — I779 Disorder of arteries and arterioles, unspecified: Secondary | ICD-10-CM

## 2018-10-15 ENCOUNTER — Other Ambulatory Visit: Payer: Self-pay | Admitting: Family Medicine

## 2018-10-20 ENCOUNTER — Ambulatory Visit (INDEPENDENT_AMBULATORY_CARE_PROVIDER_SITE_OTHER): Payer: Medicare Other | Admitting: Vascular Surgery

## 2018-10-20 ENCOUNTER — Other Ambulatory Visit: Payer: Self-pay

## 2018-10-20 ENCOUNTER — Ambulatory Visit (HOSPITAL_COMMUNITY)
Admission: RE | Admit: 2018-10-20 | Discharge: 2018-10-20 | Disposition: A | Discharge status: Home / Self Care | Payer: Medicare Other | Source: Ambulatory Visit | Attending: Vascular Surgery | Admitting: Vascular Surgery

## 2018-10-20 ENCOUNTER — Ambulatory Visit (INDEPENDENT_AMBULATORY_CARE_PROVIDER_SITE_OTHER)
Admission: RE | Admit: 2018-10-20 | Discharge: 2018-10-20 | Disposition: A | Discharge status: Home / Self Care | Payer: Medicare Other | Source: Ambulatory Visit | Attending: Vascular Surgery | Admitting: Vascular Surgery

## 2018-10-20 ENCOUNTER — Encounter: Payer: Self-pay | Admitting: Vascular Surgery

## 2018-10-20 VITALS — BP 171/69 | HR 58 | Temp 97.9°F | Resp 14 | Ht 73.5 in | Wt 159.0 lb

## 2018-10-20 DIAGNOSIS — I739 Peripheral vascular disease, unspecified: Secondary | ICD-10-CM

## 2018-10-20 DIAGNOSIS — N2889 Other specified disorders of kidney and ureter: Secondary | ICD-10-CM

## 2018-10-20 DIAGNOSIS — I779 Disorder of arteries and arterioles, unspecified: Secondary | ICD-10-CM

## 2018-10-20 NOTE — Progress Notes (Signed)
Patient name: Leah [name] MRN: [id number] DOB: 02-24-1951 Sex: female  REASON FOR CONSULT: Follow-up after left EIA angioplasty with drug coated balloon and bilateral renal artery angioplasty  HPI: Leah [name] is a 68 y.o. female with multiple medical comorbidities including coronary artery disease status post CABG and mitral valve replacement, heart failure, COPD, tobacco abuse, hypertension, hyperlipidemia, bilateral renal stents for renal artery stenosis, and history of claudication with left common femoral to below-knee pop bypass in 2012 by Dr. Kellie Simmering that presents for follow-up after recent left external iliac artery angioplasty and bilateral renal artery angioplasty all for high grade stenosis noted on surveillance.  She states her left leg is doing great.  No issues with femoral access.  She is walking with no claudication symptoms.  Taken aspirin and Plavix.  She still smoking.  No issues with renal function per patient.    Past Medical History:  Diagnosis Date  . Anemia   . Bronchogenic lung cancer, right ([place]) 11/29/2016  . CAD (coronary artery disease)    a. s/p LIMA-LAD in 11/2014  . Carotid artery occlusion   . CHF (congestive heart failure) ([place])   . Complication of anesthesia    slow to awaken x 1 maybe 2001  . COPD (chronic obstructive pulmonary disease) ([place])   . GERD (gastroesophageal reflux disease)    "sometimes" takes Copywriter, advertising or drinks gingerale   . Headache(784.0)   . History of kidney stones   . Hyperlipidemia   . Hypertension   . Leg pain   . Peripheral vascular disease ([place])   . Renal vascular disease 10/26/2014   bilateral stents placed   . Severe mitral regurgitation    a. s/p MVR in 11/2014 with a pericardial tissue valve  . Shortness of breath dyspnea   . Tobacco abuse     Past Surgical History:  Procedure Laterality Date  . ABDOMINAL AORTOGRAM W/LOWER EXTREMITY Left 09/16/2018   Procedure: ABDOMINAL AORTOGRAM W/LOWER EXTREMITY;   Surgeon: Marty Heck, MD;  Location: [facility];  Service: Cardiovascular;  Laterality: Left;  . ABDOMINAL HYSTERECTOMY    . ANGIOPLASTY / STENTING ILIAC  2010   right external iliac by Dr. Irish Lack  . CARDIAC CATHETERIZATION  11/13/11   Left Heart Cath. with Coronary Angiogram  . CARDIAC CATHETERIZATION N/A 08/24/2014   Procedure: Left Heart Cath and Coronary Angiography;  Surgeon: Wellington Hampshire, MD;  Location: [facility];  Service: Cardiovascular;  Laterality: N/A;  . CARDIAC CATHETERIZATION N/A 10/26/2014   Procedure: Right/Left Heart Cath and Coronary Angiography;  Surgeon: Peter M Martinique, MD; oLAD 70%, mLAD 70% ISR, D2 30%, OFC 30%, RCA 20%, EF nl, low R heart pressures after diuresis, severe MR  . CAROTID ENDARTERECTOMY  11/21/2007   left  . COLONOSCOPY W/ POLYPECTOMY    . CORONARY ANGIOPLASTY WITH STENT PLACEMENT  6/09   LAD 2.5x12 [place]  . CORONARY ARTERY BYPASS GRAFT N/A 12/05/2014   Procedure: CORONARY ARTERY BYPASS GRAFTING (CABG);  Surgeon: [name], MD;  Location: [facility];  Service: Open Heart Surgery;  Laterality: N/A;  Times 1 using left internal mammary artery to LAD  . FEMORAL-POPLITEAL BYPASS GRAFT  10/12   left Dr. Kellie Simmering  . LOWER EXTREMITY ANGIOGRAPHY N/A 02/04/2018   Procedure: LOWER EXTREMITY ANGIOGRAPHY;  Surgeon: Marty Heck, MD;  Location: [facility];  Service: Cardiovascular;  Laterality: N/A;  . MITRAL VALVE REPAIR  2016  . MITRAL VALVE REPLACEMENT N/A 12/05/2014  Procedure: MITRAL VALVE (MV) REPLACEMENT;  Surgeon: [name], MD;  Location: [facility];  Service: Open Heart Surgery;  Laterality: N/A;  Closure left atrial appendage  . PERIPHERAL VASCULAR BALLOON ANGIOPLASTY Bilateral 09/16/2018   Procedure: PERIPHERAL VASCULAR BALLOON ANGIOPLASTY;  Surgeon: Marty Heck, MD;  Location: [facility];  Service: Cardiovascular;  Laterality: Bilateral;  bilateral renal arteries, Left external iliac  .  PERIPHERAL VASCULAR INTERVENTION Left 02/04/2018   Procedure: PERIPHERAL VASCULAR INTERVENTION;  Surgeon: Marty Heck, MD;  Location: [facility];  Service: Cardiovascular;  Laterality: Left;  external iliac  . RENAL ANGIOGRAPHY Bilateral 09/16/2018   Procedure: RENAL ANGIOGRAPHY;  Surgeon: Marty Heck, MD;  Location: [facility];  Service: Cardiovascular;  Laterality: Bilateral;  . RENAL ARTERY STENT Bilateral   . TEE WITHOUT CARDIOVERSION N/A 10/24/2014   Procedure: TRANSESOPHAGEAL ECHOCARDIOGRAM (TEE);  Surgeon: Thayer Headings, MD;  Location: [facility];  Service: Cardiovascular;  Laterality: N/A;  . TEE WITHOUT CARDIOVERSION N/A 12/05/2014   Procedure: TRANSESOPHAGEAL ECHOCARDIOGRAM (TEE);  Surgeon: [name], MD;  Location: [facility];  Service: Open Heart Surgery;  Laterality: N/A;  . VIDEO ASSISTED THORACOSCOPY (VATS)/WEDGE RESECTION Right 11/29/2016   Procedure: VIDEO ASSISTED THORACOSCOPY (VATS)/ RUL WEDGE RESECTION OF LESION/ NODE SAMPLING;  Surgeon: [name], MD;  Location: [facility];  Service: Thoracic;  Laterality: Right;  Marland Kitchen VIDEO BRONCHOSCOPY N/A 11/29/2016   Procedure: VIDEO BRONCHOSCOPY;  Surgeon: [name], MD;  Location: [facility];  Service: Thoracic;  Laterality: N/A;    Family History  Problem Relation Age of Onset  . Cancer Mother        BRAIN AND LUNG  . Hypertension Mother   . Hypertension Father   . Heart disease Father   . Prostate cancer Father   . Kidney disease Father   . Hypertension Brother   . ALS Brother   . Stroke Paternal Aunt        great aunt  . Heart attack Neg Hx   . Colon cancer Neg Hx   . Stomach cancer Neg Hx   . Rectal cancer Neg Hx   . Esophageal cancer Neg Hx   . Liver cancer Neg Hx     SOCIAL HISTORY: Social History   Socioeconomic History  . Marital status: Divorced    Spouse name: Not on file  . Number of children: 4  . Years of education: Not on file  . Highest education level: Not on  file  Occupational History  . Occupation: retired  Scientific laboratory technician  . Financial resource strain: Not on file  . Food insecurity    Worry: Not on file    Inability: Not on file  . Transportation needs    Medical: Not on file    Non-medical: Not on file  Tobacco Use  . Smoking status: Current Every Day Smoker    Packs/day: 0.50    Years: 35.00    Pack years: 17.50    Types: Cigarettes  . Smokeless tobacco: Never Used  Substance and Sexual Activity  . Alcohol use: Yes    Alcohol/week: 0.0 standard drinks    Comment: occasion  . Drug use: No  . Sexual activity: Never  Lifestyle  . Physical activity    Days per week: Not on file    Minutes per session: Not on file  . Stress: Not on file  Relationships  . Social connections    Talks on phone: Not on file  Gets together: Not on file    Attends religious service: Not on file    Active member of club or organization: Not on file    Attends meetings of clubs or organizations: Not on file    Relationship status: Not on file  . Intimate partner violence    Fear of current or ex partner: Not on file    Emotionally abused: Not on file    Physically abused: Not on file    Forced sexual activity: Not on file  Other Topics Concern  . Not on file  Social History Narrative  . Not on file    Allergies  Allergen Reactions  . Lisinopril Swelling    Angioedema 06/10/11  . Chantix [Varenicline] Other (See Comments)    Caused insomnia  . Penicillins Hives    Did it involve swelling of the face/tongue/throat, SOB, or low BP? No Did it involve sudden or severe rash/hives, skin peeling, or any reaction on the inside of your mouth or nose? No Did you need to seek medical attention at a hospital or doctor's office? No When did it last happen?50 Years If all above answers are "NO", may proceed with cephalosporin use.      Current Outpatient Medications  Medication Sig Dispense Refill  . albuterol (PROVENTIL HFA;VENTOLIN HFA)  108 (90 Base) MCG/ACT inhaler Inhale 2 puffs into the lungs every 6 (six) hours as needed for wheezing or shortness of breath. 1 Inhaler 0  . aspirin EC 81 MG tablet Take 1 tablet (81 mg total) by mouth daily. 90 tablet 1  . clopidogrel (PLAVIX) 75 MG tablet Take 1 tablet (75 mg total) by mouth daily. 30 tablet 11  . cyclobenzaprine (FLEXERIL) 10 MG tablet Take 1 tablet by mouth three times daily as needed for muscle spasm 180 tablet 0  . ezetimibe (ZETIA) 10 MG tablet Take 1 tablet (10 mg total) by mouth daily. (Patient taking differently: Take 10 mg by mouth daily at 3 pm. ) 90 tablet 3  . furosemide (LASIX) 40 MG tablet Take 0.5 tablets (20 mg total) by mouth daily. (Patient taking differently: Take 20 mg by mouth every evening. ) 30 tablet 6  . gabapentin (NEURONTIN) 100 MG capsule TAKE 2 CAPSULES BY MOUTH THREE TIMES DAILY 120 capsule 0  . isosorbide-hydrALAZINE (BIDIL) 20-37.5 MG tablet Take 1 tablet by mouth 3 (three) times daily. 90 tablet 11  . metoprolol succinate (TOPROL-XL) 25 MG 24 hr tablet Take 1 tablet (25 mg total) by mouth daily. 90 tablet 3  . ondansetron (ZOFRAN) 4 MG tablet Take 1 tablet (4 mg total) by mouth every 8 (eight) hours as needed for nausea or vomiting. 20 tablet 0  . potassium chloride SA (K-DUR,KLOR-CON) 20 MEQ tablet Take 40 mEq by mouth 2 (two) times daily.  90 tablet 3  . traMADol (ULTRAM) 50 MG tablet TAKE 1 TABLET BY MOUTH EVERY 6 HOURS AS NEEDED FOR PAIN 90 tablet 0  . traZODone (DESYREL) 100 MG tablet TAKE 1 TABLET BY MOUTH AT BEDTIME 90 tablet 0  . atorvastatin (LIPITOR) 80 MG tablet Take 1 tablet (80 mg total) by mouth daily. (Patient not taking: Reported on 09/10/2018) 30 tablet 6  . nitroGLYCERIN (NITROSTAT) 0.4 MG SL tablet Place 1 tablet (0.4 mg total) under the tongue every 5 (five) minutes x 3 doses as needed for chest pain. (Patient not taking: Reported on 10/20/2018) 25 tablet 12   No current facility-administered medications for this visit.      REVIEW  OF SYSTEMS:  [X]  denotes positive finding, [ ]  denotes negative finding Cardiac  Comments:  Chest pain or chest pressure:    Shortness of breath upon exertion:    Short of breath when lying flat:    Irregular heart rhythm:        Vascular    Pain in calf, thigh, or hip brought on by ambulation:    Pain in feet at night that wakes you up from your sleep:     Blood clot in your veins:    Leg swelling:         Pulmonary    Oxygen at home:    Productive cough:     Wheezing:         Neurologic    Sudden weakness in arms or legs:     Sudden numbness in arms or legs:     Sudden onset of difficulty speaking or slurred speech:    Temporary loss of vision in one eye:     Problems with dizziness:         Gastrointestinal    Blood in stool:     Vomited blood:         Genitourinary    Burning when urinating:     Blood in urine:        Psychiatric    Major depression:         Hematologic    Bleeding problems:    Problems with blood clotting too easily:        Skin    Rashes or ulcers:        Constitutional    Fever or chills:      PHYSICAL EXAM: Vitals:   10/20/18 1050  BP: (!) 171/69  Pulse: (!) 58  Resp: 14  Temp: 97.9 F (36.6 C)  TempSrc: Temporal  SpO2: 99%  Weight: 159 lb (72.1 kg)  Height: 6' 1.5" (1.867 m)    GENERAL: The patient is a well-nourished female, in no acute distress. The vital signs are documented above. CARDIAC: There is a regular rate and rhythm.  VASCULAR:  2+ femoral pulse palpable bilateral groins PT pulses palpable bilaterally PULMONARY: There is good air exchange bilaterally without wheezing or rales. ABDOMEN: Soft and non-tender with normal pitched bowel sounds.  MUSCULOSKELETAL: There are no major deformities or cyanosis. NEUROLOGIC: No focal weakness or paresthesias are detected.   DATA:   Velocity proximal to left external iliac stent decreased from 420 to 261  Velocity in the right renal artery origin decreased  from 750 to 256..  Velocity in the left renal artery origin is slightly increased from 204 to 305.  Assessment/Plan:  68 year old female with multiple medical comorbidities that previously underwent left common femoral to below-knee popliteal bypass in 2012 for claudication by Dr. Kellie Simmering.. She had recurrent claudication symptoms and then underwent left external iliac stent by myself on 02/04/2018 with a threatened bypass.  She had an increasing velocity proximal to the left external iliac stent of over 400 and now after angioplasty with drug-coated balloon that is decreased to 261.  She has a palpable left femoral pulse and a posterior tibial pulse in the foot with no claudication.  I think she has done well from an iliac intervention.  As it relates to her renal arteries, I angioplastied both of her existing renal stents that were placed years ago given recurrent high grade stenosis.  She had a very high-grade stenosis on the right with a velocity of 759 now  decreased to 256 with a nice result.  There is really no change on the left renal velocity.  I do not plan on any more renal interventions at this time.  Discusssed aspirin and plavix.  I will see her back in 6 months for ongoing surveillance.    Marty Heck, MD [facility] Office: [phone] Pager: [place]

## 2018-10-27 ENCOUNTER — Other Ambulatory Visit: Payer: Self-pay | Admitting: Family Medicine

## 2018-10-27 DIAGNOSIS — G8918 Other acute postprocedural pain: Secondary | ICD-10-CM

## 2018-11-24 ENCOUNTER — Other Ambulatory Visit: Payer: Self-pay | Admitting: Family Medicine

## 2018-11-24 DIAGNOSIS — G8918 Other acute postprocedural pain: Secondary | ICD-10-CM

## 2018-12-28 ENCOUNTER — Other Ambulatory Visit: Payer: Self-pay | Admitting: Family Medicine

## 2018-12-28 DIAGNOSIS — G8929 Other chronic pain: Secondary | ICD-10-CM

## 2018-12-28 DIAGNOSIS — G8918 Other acute postprocedural pain: Secondary | ICD-10-CM

## 2018-12-29 ENCOUNTER — Telehealth: Payer: Self-pay | Admitting: *Deleted

## 2018-12-29 NOTE — Telephone Encounter (Signed)
Pharmacy called and needs clarification on the Dx code of tramadol.  The most recent one sent was for post-op pain which is acute.  She has had several fills on this and wants to make sure that is still the correct Diagnosis.  Please call MaryGrace back @ [phone]. Christen Bame, CMA

## 2018-12-31 NOTE — Telephone Encounter (Signed)
Gildardo Cranker calling back from pharmacy. Pt is indicating the tramadol is for chronic back pain, not for post op pain. Please call and clarify Dx code. Gildardo Cranker can be reached at [phone]. Ottis Stain, CMA

## 2019-01-01 NOTE — Telephone Encounter (Signed)
Pt calling again to check status. Christen Bame, CMA

## 2019-01-04 NOTE — Telephone Encounter (Signed)
Called and left message for new diagnosis of chronic back pain to be associated with tramadol prescription.  Guadalupe Dawn MD PGY-3 Family Medicine Resident

## 2019-01-27 NOTE — Progress Notes (Signed)
Virtual Visit via Video Note   This visit type was conducted due to national recommendations for restrictions regarding the COVID-19 Pandemic (e.g. social distancing) in an effort to limit this patient's exposure and mitigate transmission in our community.  Due to her co-morbid illnesses, this patient is at least at moderate risk for complications without adequate follow up.  This format is felt to be most appropriate for this patient at this time.  All issues noted in this document were discussed and addressed.  A limited physical exam was performed with this format.  Please refer to the patient's chart for her consent to telehealth for [facility].   Date:  01/29/2019   ID:  Leah [name], DOB 1950/06/02, MRN [id number]  Patient Location: Home Provider Location: Office  PCP:  Guadalupe Dawn, MD  Cardiologist:  Nyonna Hargrove Martinique, MD  Electrophysiologist:  None   Evaluation Performed:  Follow-Up Visit  Chief Complaint:  Follow up CAD  History of Present Illness:    Leah [name] is a 68 y.o. female with a past medical history of CAD (s/p LIMA-LAD in 11/2014), MVR (in 11/2014 with a pericardial tissue valve), HTN, HLD, COPD, and PVD (s/p L CEA and right external iliac stenting), CHF.  In August 2018 she had pre op evaluation with a Myoview study that showed no ischemic and reduced EF. She had a right upper lobe lung nodule which had increased in size. She underwent resection in September 2018 with pathology showing adenocarcinoma stage 1. Negative margins. Echo performed in October showed EF 40% with normally functioning Mitral valve prosthesis.   She was admitted in March 2019 with CHF exacerbation. Responded to IV diuresis. Continued on other medical therapy. CT of the chest showed no PE. Discharge weight 168 lbs.   On 02/04/18 she underwent LE angiogram for left leg claudication. She had PTA/stenting of the left external iliac artery by Dr. Fortunato Curling. She was also noted  to have an 80% right renal artery stenosis. This was a difficult procedure because she had severe back spasms. Her potassium was also low.   Carotid dopplers on 04/24/18 showed no change in bilateral moderate carotid disease.   On her last visit she had an episode of altered mental status. Not clear if she passed out. Event monitor showed PVCs, bigeminy, and 5 and 7 beat runs of NSVT. No symptoms.   In June of this year seen by [facility]. Noted increased velocity in left iliac stent. History of left fem-pop BPG. She had repeat angiogram showing stenosis in the left external iliac artery and in bilateral renal artery stents. This was all treated with balloon angioplasty.   Also followed by Dr Servando Snare for follow up lung CA. Plan CT chest in 6 months.  On follow up today she reports that for the last 2 days she has increased SOB at night and feels like her heart is racing. During the day she does have DOE. No change in weight or edema but symptoms similar to when she was retaining fluid before. No dizziness or syncope. No chest pain  The patient does not have symptoms concerning for COVID-19 infection (fever, chills, cough, or new shortness of breath).    Past Medical History:  Diagnosis Date  . Anemia   . Bronchogenic lung cancer, right ([place]) 11/29/2016  . CAD (coronary artery disease)    a. s/p LIMA-LAD in 11/2014  . Carotid artery occlusion   . CHF (congestive heart failure) ([place])   . Complication of  anesthesia    slow to awaken x 1 maybe 2001  . COPD (chronic obstructive pulmonary disease) ([place])   . GERD (gastroesophageal reflux disease)    "sometimes" takes Copywriter, advertising or drinks gingerale   . Headache(784.0)   . History of kidney stones   . Hyperlipidemia   . Hypertension   . Leg pain   . Peripheral vascular disease ([place])   . Renal vascular disease 10/26/2014   bilateral stents placed   . Severe mitral regurgitation    a. s/p MVR in 11/2014 with a pericardial tissue valve  .  Shortness of breath dyspnea   . Tobacco abuse    Past Surgical History:  Procedure Laterality Date  . ABDOMINAL AORTOGRAM W/LOWER EXTREMITY Left 09/16/2018   Procedure: ABDOMINAL AORTOGRAM W/LOWER EXTREMITY;  Surgeon: Marty Heck, MD;  Location: [place] [facility];  Service: Cardiovascular;  Laterality: Left;  . ABDOMINAL HYSTERECTOMY    . ANGIOPLASTY / STENTING ILIAC  2010   right external iliac by Dr. Irish Lack  . CARDIAC CATHETERIZATION  11/13/11   Left Heart Cath. with Coronary Angiogram  . CARDIAC CATHETERIZATION N/A 08/24/2014   Procedure: Left Heart Cath and Coronary Angiography;  Surgeon: Wellington Hampshire, MD;  Location: [place] [facility];  Service: Cardiovascular;  Laterality: N/A;  . CARDIAC CATHETERIZATION N/A 10/26/2014   Procedure: Right/Left Heart Cath and Coronary Angiography;  Surgeon: Shi Grose M Martinique, MD; oLAD 70%, mLAD 70% ISR, D2 30%, OFC 30%, RCA 20%, EF nl, low R heart pressures after diuresis, severe MR  . CAROTID ENDARTERECTOMY  11/21/2007   left  . COLONOSCOPY W/ POLYPECTOMY    . CORONARY ANGIOPLASTY WITH STENT PLACEMENT  6/09   LAD 2.5x12 Promus  . CORONARY ARTERY BYPASS GRAFT N/A 12/05/2014   Procedure: CORONARY ARTERY BYPASS GRAFTING (CABG);  Surgeon: [name], MD;  Location: [place];  Service: Open Heart Surgery;  Laterality: N/A;  Times 1 using left internal mammary artery to LAD  . FEMORAL-POPLITEAL BYPASS GRAFT  10/12   left Dr. Kellie Simmering  . LOWER EXTREMITY ANGIOGRAPHY N/A 02/04/2018   Procedure: LOWER EXTREMITY ANGIOGRAPHY;  Surgeon: Marty Heck, MD;  Location: [place] [facility];  Service: Cardiovascular;  Laterality: N/A;  . MITRAL VALVE REPAIR  2016  . MITRAL VALVE REPLACEMENT N/A 12/05/2014   Procedure: MITRAL VALVE (MV) REPLACEMENT;  Surgeon: [name], MD;  Location: [place];  Service: Open Heart Surgery;  Laterality: N/A;  Closure left atrial appendage  . PERIPHERAL VASCULAR BALLOON ANGIOPLASTY Bilateral 09/16/2018   Procedure:  PERIPHERAL VASCULAR BALLOON ANGIOPLASTY;  Surgeon: Marty Heck, MD;  Location: [place] [facility];  Service: Cardiovascular;  Laterality: Bilateral;  bilateral renal arteries, Left external iliac  . PERIPHERAL VASCULAR INTERVENTION Left 02/04/2018   Procedure: PERIPHERAL VASCULAR INTERVENTION;  Surgeon: Marty Heck, MD;  Location: [place] [facility];  Service: Cardiovascular;  Laterality: Left;  external iliac  . RENAL ANGIOGRAPHY Bilateral 09/16/2018   Procedure: RENAL ANGIOGRAPHY;  Surgeon: Marty Heck, MD;  Location: [place] [facility];  Service: Cardiovascular;  Laterality: Bilateral;  . RENAL ARTERY STENT Bilateral   . TEE WITHOUT CARDIOVERSION N/A 10/24/2014   Procedure: TRANSESOPHAGEAL ECHOCARDIOGRAM (TEE);  Surgeon: Thayer Headings, MD;  Location: [place];  Service: Cardiovascular;  Laterality: N/A;  . TEE WITHOUT CARDIOVERSION N/A 12/05/2014   Procedure: TRANSESOPHAGEAL ECHOCARDIOGRAM (TEE);  Surgeon: [name], MD;  Location: [place];  Service: Open Heart Surgery;  Laterality: N/A;  . VIDEO ASSISTED  THORACOSCOPY (VATS)/WEDGE RESECTION Right 11/29/2016   Procedure: VIDEO ASSISTED THORACOSCOPY (VATS)/ RUL WEDGE RESECTION OF LESION/ NODE SAMPLING;  Surgeon: [name], MD;  Location: [place];  Service: Thoracic;  Laterality: Right;  Marland Kitchen VIDEO BRONCHOSCOPY N/A 11/29/2016   Procedure: VIDEO BRONCHOSCOPY;  Surgeon: [name], MD;  Location: [facility];  Service: Thoracic;  Laterality: N/A;     Current Meds  Medication Sig  . albuterol (PROVENTIL HFA;VENTOLIN HFA) 108 (90 Base) MCG/ACT inhaler Inhale 2 puffs into the lungs every 6 (six) hours as needed for wheezing or shortness of breath.  Marland Kitchen aspirin EC 81 MG tablet Take 1 tablet (81 mg total) by mouth daily.  . clopidogrel (PLAVIX) 75 MG tablet Take 1 tablet (75 mg total) by mouth daily.  . cyclobenzaprine (FLEXERIL) 10 MG tablet Take 1 tablet by mouth three times daily as needed for muscle spasm  .  ezetimibe (ZETIA) 10 MG tablet Take 1 tablet (10 mg total) by mouth daily. (Patient taking differently: Take 10 mg by mouth daily at 3 pm. )  . furosemide (LASIX) 40 MG tablet Take 0.5 tablets (20 mg total) by mouth daily. (Patient taking differently: Take 20 mg by mouth every evening. )  . gabapentin (NEURONTIN) 100 MG capsule TAKE 2 CAPSULES BY MOUTH THREE TIMES DAILY  . isosorbide-hydrALAZINE (BIDIL) 20-37.5 MG tablet Take 1 tablet by mouth 3 (three) times daily.  . metoprolol succinate (TOPROL-XL) 25 MG 24 hr tablet Take 1 tablet (25 mg total) by mouth daily.  . nitroGLYCERIN (NITROSTAT) 0.4 MG SL tablet Place 1 tablet (0.4 mg total) under the tongue every 5 (five) minutes x 3 doses as needed for chest pain.  Marland Kitchen ondansetron (ZOFRAN) 4 MG tablet Take 1 tablet (4 mg total) by mouth every 8 (eight) hours as needed for nausea or vomiting.  . potassium chloride SA (K-DUR,KLOR-CON) 20 MEQ tablet Take 40 mEq by mouth 2 (two) times daily.   . traMADol (ULTRAM) 50 MG tablet TAKE 1 TABLET BY MOUTH EVERY 6 HOURS AS NEEDED FOR PAIN  . traZODone (DESYREL) 100 MG tablet TAKE 1 TABLET BY MOUTH AT BEDTIME  . [DISCONTINUED] nitroGLYCERIN (NITROSTAT) 0.4 MG SL tablet Place 1 tablet (0.4 mg total) under the tongue every 5 (five) minutes x 3 doses as needed for chest pain.     Allergies:   Lisinopril, Chantix [varenicline], and Penicillins   Social History   Tobacco Use  . Smoking status: Current Every Day Smoker    Packs/day: 0.50    Years: 35.00    Pack years: 17.50    Types: Cigarettes  . Smokeless tobacco: Never Used  Substance Use Topics  . Alcohol use: Yes    Alcohol/week: 0.0 standard drinks    Comment: occasion  . Drug use: No     Family Hx: The patient's family history includes ALS in her brother; Cancer in her mother; Heart disease in her father; Hypertension in her brother, father, and mother; Kidney disease in her father; Prostate cancer in her father; Stroke in her paternal aunt. There is  no history of Heart attack, Colon cancer, Stomach cancer, Rectal cancer, Esophageal cancer, or Liver cancer.  ROS:   Please see the history of present illness.    All other systems reviewed and are negative.   Prior CV studies:   The following studies were reviewed today:  Myoview 11/20/16: Study Highlights    Nuclear stress EF: 31%. The left ventricular ejection fraction is moderately decreased (30-44%).  Defect 1: There  is a medium defect of moderate severity present in the apical anterior, apical septal, apical inferior and apex location.  Findings consistent with prior apical myocardial infarction.  This is a high risk study based on severely reduced LV function . There is no evidence of ischemia   Echo 01/13/17: Study Conclusions  - Left ventricle: The cavity size was normal. Wall thickness was normal. Systolic function was mildly to moderately reduced. The estimated ejection fraction was 40%. Diffuse hypokinesis. There was a reduced contribution of atrial contraction to ventricular filling, due to increased ventricular diastolic pressure or atrial contractile dysfunction. Doppler parameters are consistent with high ventricular filling pressure. - Aortic valve: There was mild regurgitation. - Mitral valve: A bioprosthesis was present. with normal function. Mean gradient (D): 2 mm Hg. Peak gradient (D): 10 mm Hg. - Left atrium: The atrium was moderately dilated. - Pulmonary arteries: PA peak pressure: 38 mm Hg (S).  09/16/2018 Pre-operative Diagnosis:  1.  Bilateral renal artery in-stent restenosis greater than 70% 2.  Left external iliac artery in-stent restenosis greater than 70% Post-operative diagnosis:  Same Surgeon:  Marty Heck, MD Procedure Performed: 1.  Ultrasound-guided access of the right common femoral artery 2.  Aortogram with bilateral renal artery arteriogram 3.  Right renal artery angioplasty (5 mm x 15 mm Viatrac and 6 mm x 15  mm Viatrac) 4.  Left renal artery angioplasty (4 mm x 15 mm Viatrac and 6 mm x 51mm Viatrac) 5.  Left external iliac artery angioplasty (6 mm x 20 mm Mustang and 6 mm x 40 mm drug-coated Lutonix) 6.  Limited left lower extremity arteriogram with selection of second-order branches 7.  Mynx closure of the right common femoral artery  8.  110 minutes of monitored moderate conscious sedation time  Indications: Patient is a 68 year old female well-known to the vascular surgery service.  She was recently seen for follow-up surveillance after left iliac intervention for recurrent claudication in the setting of a left femoral to below-knee popliteal bypass in 2012.  Ultimately on surveillance imaging she had evidence of a greater than 70% stenosis just proximal to her left external iliac stent.  In addition she has bilateral renal artery stents that were placed in 2012 with evidence of >70% stenosis in both stents.  She presents today for a bilateral renal artery intervention in addition to left iliac intervention after risks and benefits were discussed.  Findings:  Aortogram showed a greater than 95% proximal stenosis in the right renal artery stent that was a 6 mm x 12 mm Herculink.  The left renal artery stent had approximately 70% stenosis and this was a 6 mm x 15 mm Herculink.  I initially ballooned the right renal artery stent with a 5 mm and then a 6 mm angioplasty balloon and there is now less than 10 to 20% residual stenosis.  On the left I could not get a 6 mm  to track and had to use initially a 4 mm ballon and then upsized this to a 6 mm angioplasty balloon.  There was approximately 30% residual stenosis.  Both renal stents and renal arteries remained patent at the end of the case with no dissections or other issues on completion aortogram. Left external iliac stent had a flow-limiting stenosis in its proximal portion at the takeoff of the left hypogastric.  This was treated with a 6 mm angioplasty  balloon and then a 6 mm drug-coated Lutonix balloon.  I did not see any reason I  needed to extend the stent up into the common iliac artery given the common iliac was widely patent.  I did a limited left lower extremity arteriogram and had preserved runoff down the left femoral to below-knee popliteal bypass. I did a limited right femoral arteriogram just to ensure that I could deploy a mynx closure device in the right common femoral artery.  Also of note the right common iliac stent is patent although it extends above the aortic bifurcation.  Her profunda is the right groinis patent but she has a very small diminuteSFA takeoff.  Labs/Other Tests and Data Reviewed:    EKG:  No ECG reviewed.  Recent Labs: 04/28/2018: ALT 8; BNP 330.4; Platelets 194 09/16/2018: BUN 14; Creatinine, Ser 1.10; Hemoglobin 15.3; Potassium 3.8; Sodium 140   Recent Lipid Panel Lab Results  Component Value Date/Time   CHOL 168 04/28/2018 03:44 PM   TRIG 119 04/28/2018 03:44 PM   HDL 48 04/28/2018 03:44 PM   CHOLHDL 3.5 04/28/2018 03:44 PM   CHOLHDL 5.6 (H) 01/15/2016 11:17 AM   LDLCALC 96 04/28/2018 03:44 PM   LDLDIRECT 185 (A) 09/09/2011 08:20 AM    Wt Readings from Last 3 Encounters:  10/20/18 159 lb (72.1 kg)  09/16/18 160 lb (72.6 kg)  09/03/18 160 lb (72.6 kg)     Objective:    Vital Signs:  There were no vitals taken for this visit.   VITAL SIGNS:  reviewed  ASSESSMENT & PLAN:    1. CAD - s/p LIMA-LAD in 11/2014.  - no real anginal symptoms. -  Lexiscan Myoview in August 2018 showed no ischemia but reduced EF   - continue ASA, statin, and BB therapy.   2. S/p MVR - performed in in 11/2014 with a pericardial tissue valve. Echo in August 2018 showed a stable prosthesis with no obvious MR.   3. Chronic systolic CHF EF 06%. In review of records she had EF 45-50% post MVR. "normal" EF prior to surgery but I suspect there was latent LV dysfunction in setting of severe MR. Follow up Echo in October  2018 showed EF 40%.  She has class 2 symptoms with some increase in orthopnea and PND at night. She is not a candidate for ACEi or ARB due to history of angioedema on lisinopril. Continue Bidil 20/37.5 mg tid. and metoprolol. Recommend increasing lasix to 40 mg daily.    4. HTN - unable to check BP at home  5. HLD - Lipitor has resulted in 50-60% reduction in LDL when she is taking it. Last LDL 100. She reports compliance with lipitor and Zetia. Will need repeat lab work. Stressed the importance of taking medication.   6. PVD -  s/p L CEA and left fem pop BPG. Bilateral carotid bruits. Carotid dopplers in January showed moderate bilateral disease. No AAA. S/p  left external iliac stenting November 2019. Marland Kitchen RAS s/p bilateral stenting - s/p repeat intervention for bilateral RAS and left iliac stenosis in June.  - very discouraged with progressive vascular disease - stressed importance of smoking cessation.  - continue ASA, Plavix, and statin therapy.   7. Tobacco Use - still smoking 0.5 ppd. Encouraged complete smoking cessation  8. Adenocarcinoma lung. S/p resection. Stage 1. No recurrence by CT.  9. Palpitations: PVCs and PACs noted on Ecg.  Take potassium supplement daily. If symptoms of tachycardia persist may need to consider event monitor.   10. Spell of altered mental state. Difficult to get clear history since patient has no  clear recollection of events. No recurrence.    COVID-19 Education: The signs and symptoms of COVID-19 were discussed with the patient and how to seek care for testing (follow up with PCP or arrange E-visit).  The importance of social distancing was discussed today.  Time:   Today, I have spent 15 minutes with the patient with telehealth technology discussing the above problems.     Medication Adjustments/Labs and Tests Ordered: Current medicines are reviewed at length with the patient today.  Concerns regarding medicines are outlined above.    Tests Ordered: No orders of the defined types were placed in this encounter.   Medication Changes: Meds ordered this encounter  Medications  . nitroGLYCERIN (NITROSTAT) 0.4 MG SL tablet    Sig: Place 1 tablet (0.4 mg total) under the tongue every 5 (five) minutes x 3 doses as needed for chest pain.    Dispense:  25 tablet    Refill:  12    Follow Up:  In Person in one month  Signed, Tonya Wantz Martinique, MD  01/29/2019 8:56 AM    [place]

## 2019-01-29 ENCOUNTER — Telehealth (INDEPENDENT_AMBULATORY_CARE_PROVIDER_SITE_OTHER): Payer: Medicare Other | Admitting: Cardiology

## 2019-01-29 ENCOUNTER — Encounter: Payer: Self-pay | Admitting: Cardiology

## 2019-01-29 ENCOUNTER — Ambulatory Visit: Payer: Medicare Other | Admitting: Cardiology

## 2019-01-29 DIAGNOSIS — I5043 Acute on chronic combined systolic (congestive) and diastolic (congestive) heart failure: Secondary | ICD-10-CM | POA: Diagnosis not present

## 2019-01-29 DIAGNOSIS — Z952 Presence of prosthetic heart valve: Secondary | ICD-10-CM

## 2019-01-29 DIAGNOSIS — I739 Peripheral vascular disease, unspecified: Secondary | ICD-10-CM

## 2019-01-29 DIAGNOSIS — I2510 Atherosclerotic heart disease of native coronary artery without angina pectoris: Secondary | ICD-10-CM

## 2019-01-29 DIAGNOSIS — Z720 Tobacco use: Secondary | ICD-10-CM

## 2019-01-29 DIAGNOSIS — Z9861 Coronary angioplasty status: Secondary | ICD-10-CM

## 2019-01-29 DIAGNOSIS — E7800 Pure hypercholesterolemia, unspecified: Secondary | ICD-10-CM

## 2019-01-29 DIAGNOSIS — R0602 Shortness of breath: Secondary | ICD-10-CM

## 2019-01-29 MED ORDER — NITROGLYCERIN 0.4 MG SL SUBL: 0.4000 mg | SUBLINGUAL_TABLET | SUBLINGUAL | 12 refills | Status: DC | PRN

## 2019-01-29 NOTE — Patient Instructions (Addendum)
Increase lasix to 40 mg daily  Stop smoking!!!  Follow up in one month     Appointment scheduled with Jory Sims DNP Wednesday 03/03/19 at 1:45 pm.  We will order lab work  ( bmet,lipid and hepatic panels,bnp have done next week 11/16 or 11/17 )

## 2019-01-29 NOTE — Addendum Note (Signed)
Addended by: Kathyrn Lass on: 01/29/2019 10:21 AM   Modules accepted: Orders

## 2019-02-01 ENCOUNTER — Other Ambulatory Visit: Payer: Self-pay | Admitting: Cardiology

## 2019-02-01 ENCOUNTER — Other Ambulatory Visit: Payer: Self-pay | Admitting: Family Medicine

## 2019-02-01 DIAGNOSIS — G8918 Other acute postprocedural pain: Secondary | ICD-10-CM

## 2019-02-04 IMAGING — CT CT CHEST SUPER D W/O CM
3 of 4 series · 17 of 30 positions shown, 19 images · non-contrast
Comparison: 07/29/2016

CLINICAL DATA: Right upper lobe pulmonary nodule. Preop evaluation
for bronchoscopy. COPD. Smoker.

EXAM:
CT CHEST WITHOUT CONTRAST
TECHNIQUE: Multidetector CT imaging of the chest was performed using thin slice
collimation for electromagnetic bronchoscopy planning purposes,
without intravenous contrast.

[Series 3: chest w/o · axial · non-contrast · 0.70mm/px · z∈[-249,+8]mm · 7 of 139 slices shown, 9 images]
[im 18/139  mediastinal]
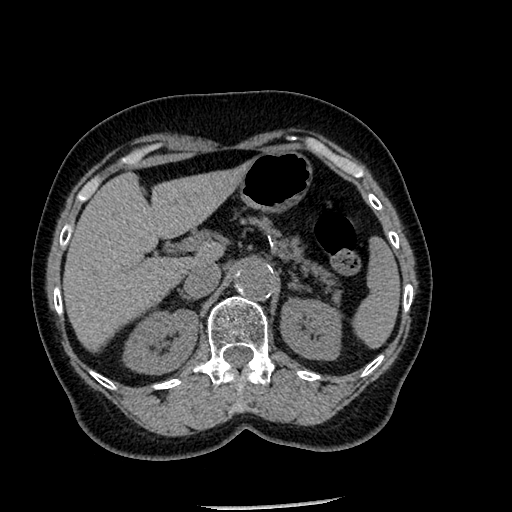
[im 18/139  lung]
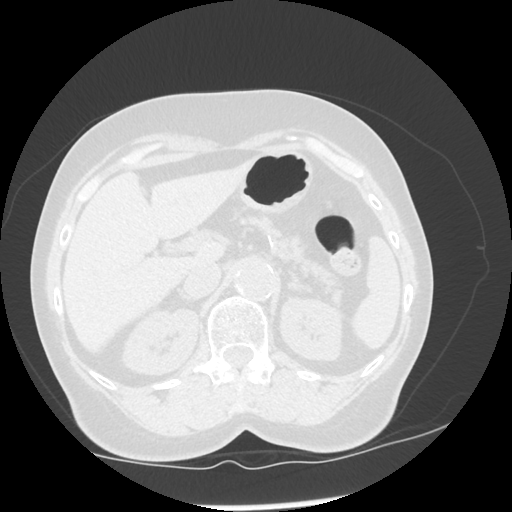
[im 35/139  lung]
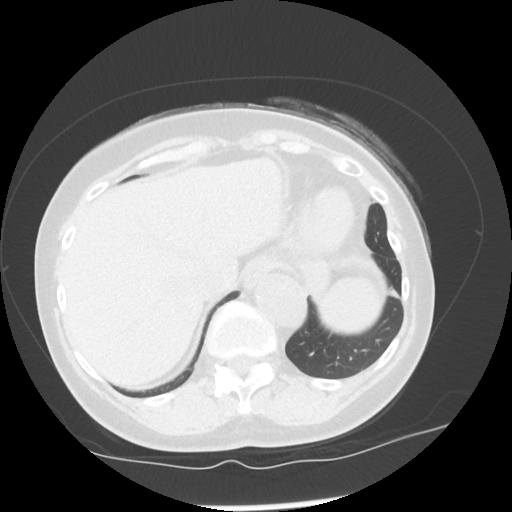
[im 52/139  lung]
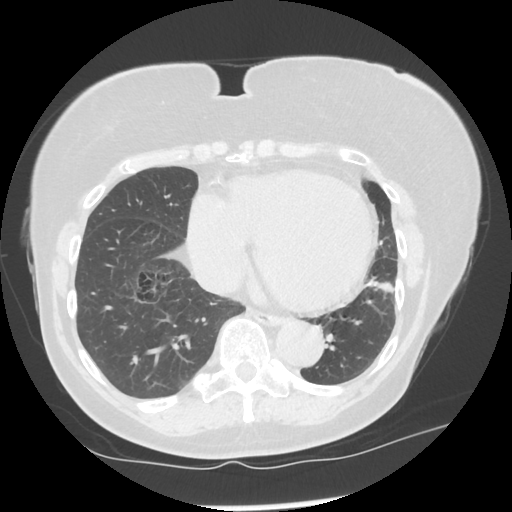
[im 70/139  lung]
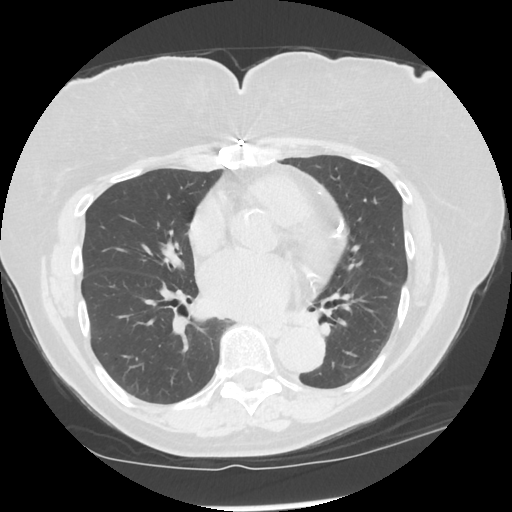
[im 87/139  mediastinal]
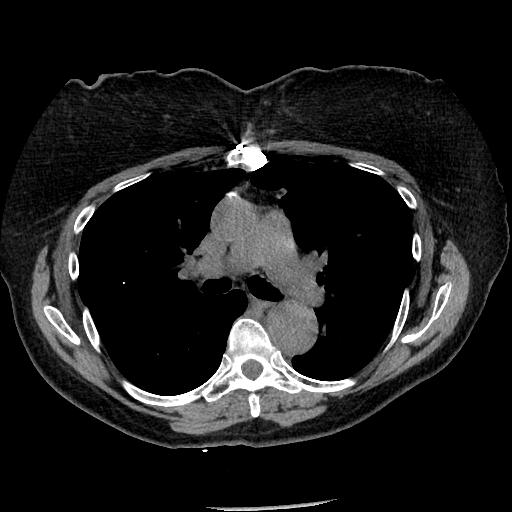
[im 87/139  lung]
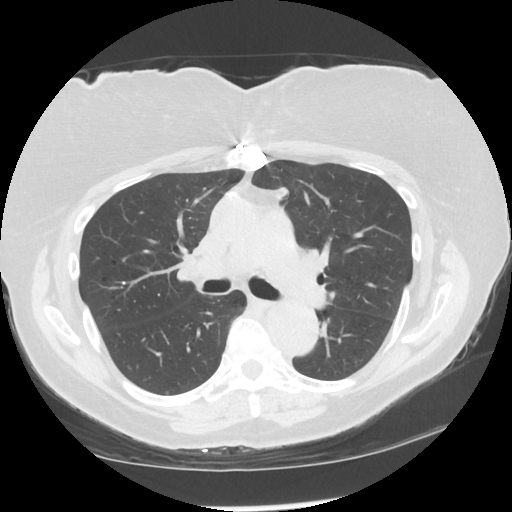
[im 104/139  lung]
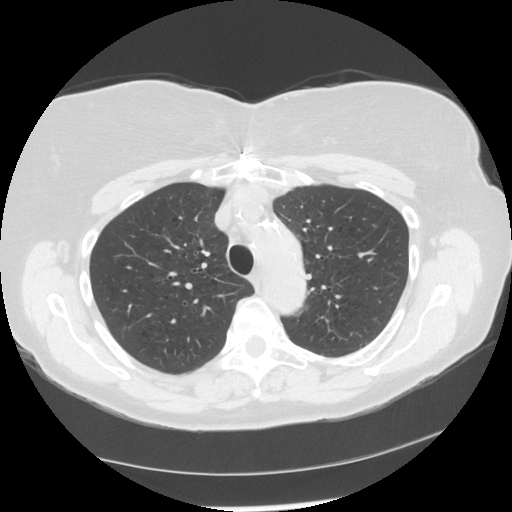
[im 121/139  lung]
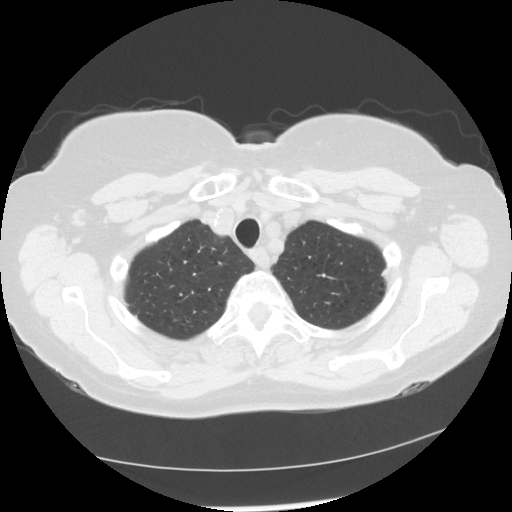

[Series 4: lung windows · axial · 0.70mm/px · z∈[-249,+8]mm · 7 of 139 slices shown]
[im 18/139  lung]
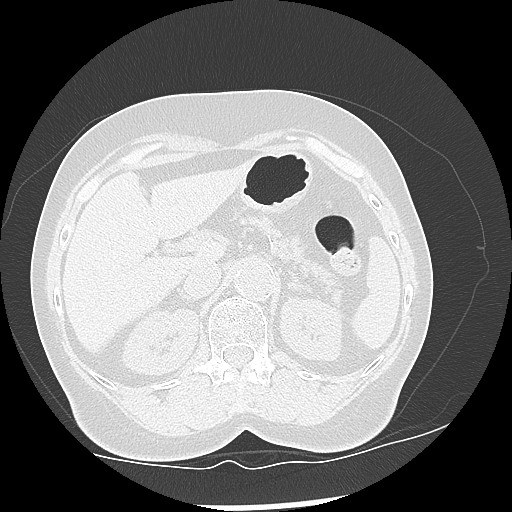
[im 35/139  lung]
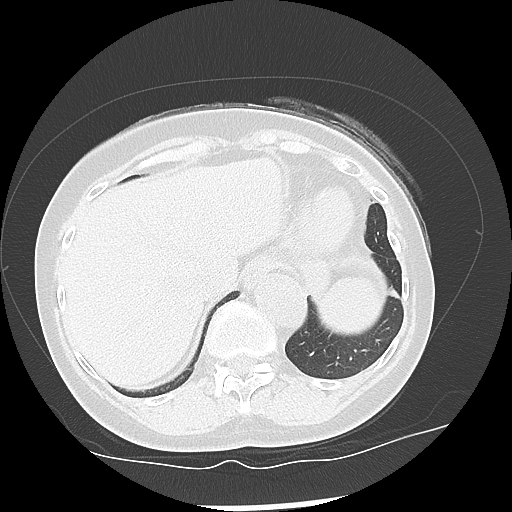
[im 52/139  lung]
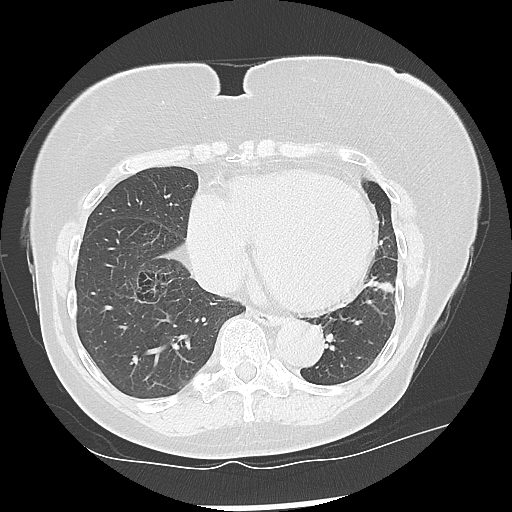
[im 70/139  lung]
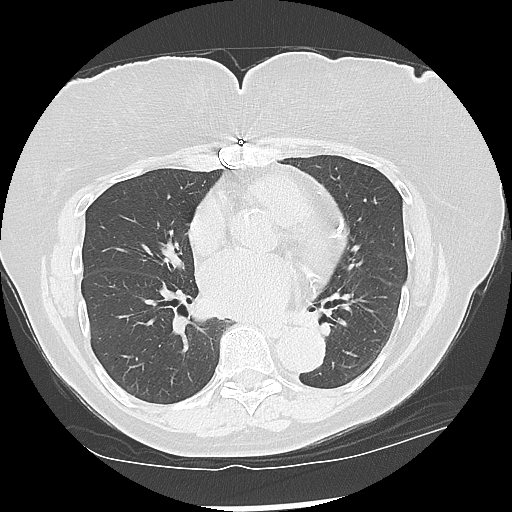
[im 87/139  lung]
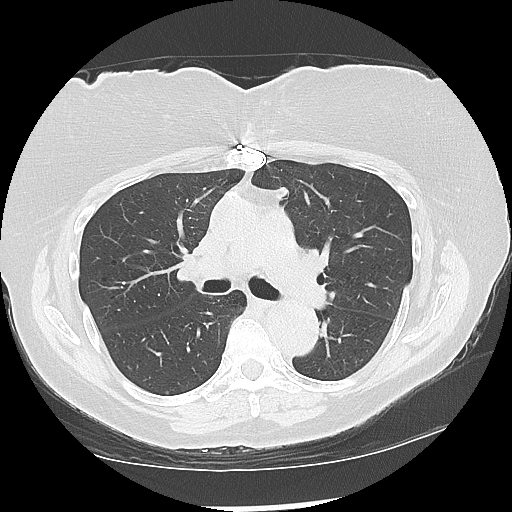
[im 104/139  lung]
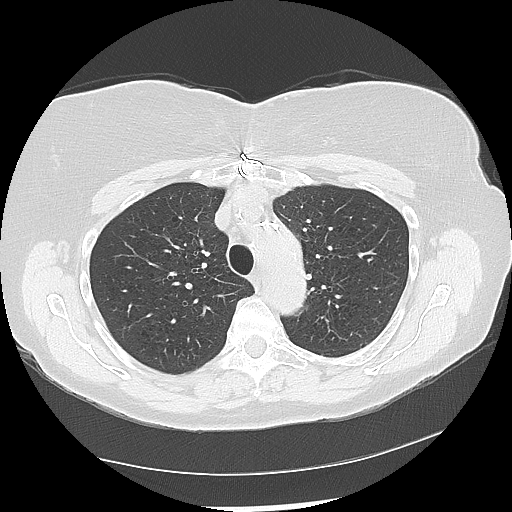
[im 121/139  lung]
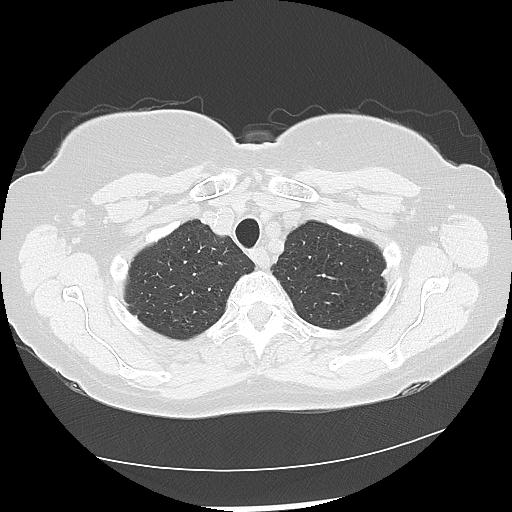

[Series 602: sagittal body · sagittal · 0.70mm/px · 3 of 145 slices shown]
[im 17/145  mediastinal]
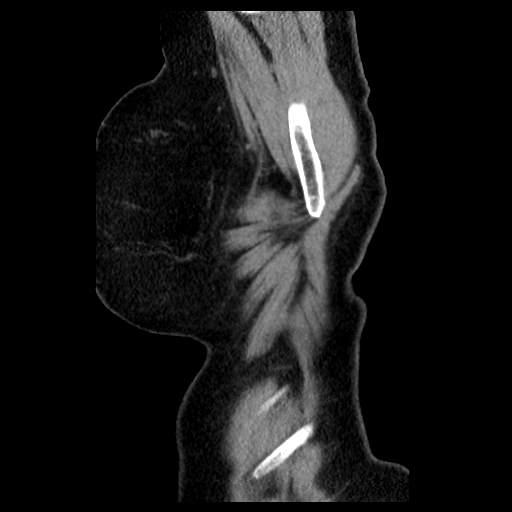
[im 33/145  mediastinal]
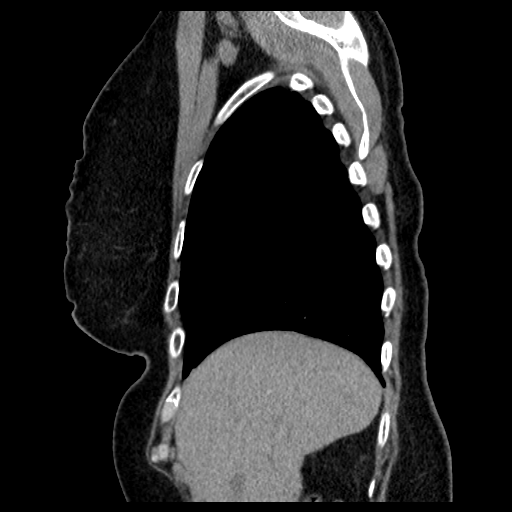
[im 49/145  mediastinal]
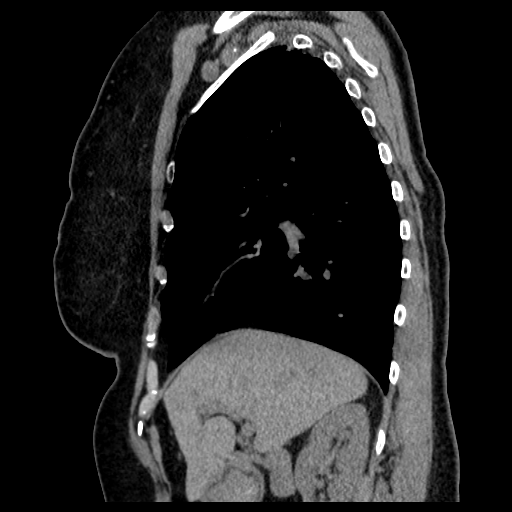

[17 of 30 positions shown; findings below may reference images not displayed]

FINDINGS: Cardiovascular: No acute findings. Aortic and coronary artery
atherosclerosis. Previous CABG and mitral valve replacement.

Mediastinum/Nodes: No masses or lymphadenopathy identified on this
unenhanced exam.

Lungs/Pleura: Persistent 11 mm spiculated nodule in the anterior
right upper lobe, suspicious for primary bronchogenic carcinoma. No
other suspicious pulmonary nodules or masses identified. No evidence
of pulmonary infiltrate or pleural effusion. Mild emphysema again
noted.

Upper Abdomen:  Normal adrenal glands.  Probable cholelithiasis.

Musculoskeletal:  No suspicious bone lesions.
IMPRESSION: Persistent 11 mm spiculated nodule in anterior right upper lobe,
suspicious for primary bronchogenic carcinoma. No evidence of
metastatic disease.

Probable incidental cholelithiasis.

Aortic Atherosclerosis (C0OOS-R6Y.Y) and Emphysema (C0OOS-7KS.Y).

## 2019-02-11 ENCOUNTER — Other Ambulatory Visit: Payer: Self-pay | Admitting: Cardiothoracic Surgery

## 2019-02-11 DIAGNOSIS — C3490 Malignant neoplasm of unspecified part of unspecified bronchus or lung: Secondary | ICD-10-CM

## 2019-02-11 NOTE — Progress Notes (Unsigned)
ct 

## 2019-02-22 ENCOUNTER — Other Ambulatory Visit: Payer: Self-pay | Admitting: Family Medicine

## 2019-02-22 DIAGNOSIS — G8918 Other acute postprocedural pain: Secondary | ICD-10-CM

## 2019-02-24 ENCOUNTER — Other Ambulatory Visit: Payer: Self-pay | Admitting: Family Medicine

## 2019-02-24 DIAGNOSIS — G8918 Other acute postprocedural pain: Secondary | ICD-10-CM

## 2019-02-26 ENCOUNTER — Other Ambulatory Visit: Payer: Self-pay | Admitting: Vascular Surgery

## 2019-02-26 ENCOUNTER — Other Ambulatory Visit: Payer: Self-pay | Admitting: Family Medicine

## 2019-02-26 DIAGNOSIS — G8929 Other chronic pain: Secondary | ICD-10-CM

## 2019-02-26 DIAGNOSIS — M545 Low back pain: Secondary | ICD-10-CM

## 2019-03-02 NOTE — Progress Notes (Signed)
Virtual Visit via Telephone Note   This visit type was conducted due to national recommendations for restrictions regarding the COVID-19 Pandemic (e.g. social distancing) in an effort to limit this patient's exposure and mitigate transmission in our community.  Due to her co-morbid illnesses, this patient is at least at moderate risk for complications without adequate follow up.  This format is felt to be most appropriate for this patient at this time.  The patient did not have access to video technology/had technical difficulties with video requiring transitioning to audio format only (telephone).  All issues noted in this document were discussed and addressed.  No physical exam could be performed with this format.  Please refer to the patient's chart for her  consent to telehealth for [facility]   Date:  03/04/2019   ID:  Leah [name], DOB Aug 16, 1950, MRN [id number]  Patient Location: Home Provider Location: Office  PCP:  Guadalupe Dawn, MD  Cardiologist:  Peter Martinique, MD  Electrophysiologist:  None   Evaluation Performed:  Follow-Up Visit  Chief Complaint:  Follow Up  History of Present Illness:    Leah [name] is a 68 y.o. female we are following for ongoing assessment and management of CAD, status post CABG in 2016 (LIMA to LAD 11/2014), severe mitral valve disease status post pericardial tissue valve at the time of the CABG, hypertension, hyperlipidemia, peripheral vascular disease status post left CEA and right external iliac stenting, CHF, with other history to include COPD.  She did undergo a lower extremity angiogram in November 2019 for left leg claudication.  She had PTA/stenting of the left external iliac artery by Dr. Fortunato Curling.  She was also noted to have renal artery stenosis at 80%, treated with stenting.  She had a repeat angiography of her lower extremity in June 2020 in the setting of left femoropopliteal BPG.  This revealed stenosis in the left  external iliac artery and bilateral renal artery in-stent restenosis.  This was treated with balloon angioplasty by [facility].  She was last seen by Dr. Martinique on 01/29/2019, via virtual medicine.  He noted that on review of her echocardiogram her EF was 45% to 50%, and suspected it was related to late in the LV dysfunction in the setting of severe MR.  She was noted to have New York Heart Association class II symptoms with increased orthopnea and PND at night.    She also noted that she was not a candidate for ACE or ARB due to history of angioedema on lisinopril.  She was continued on BiDil and metoprolol, and Lasix was increased to 40 mg daily.  He did not make any other changes in her medication regimen.  She was also counseled on smoking cessation in the setting of PVD and frequent in-stent restenosis.  She has been seen by Dr. Servando Snare, thoracic surgeon, has been ordered a CT scan of the chest with a diagnosis of malignant neoplasm in the past of the right lung.  She had a right upper lobe wedge resection.  At this time the CT scan has not yet been completed.  She is without any cardiac complaints today.  She did have a chest cold that lasted about 3 days last week but this has resolved.  She denies any hemoptysis, fever, chills, or worsening dyspnea.  She has been medically compliant.  She is due to follow-up with Dr. Servando Snare tomorrow.  She has not had recent labs.  She states that she has some fatigue but  she is able to complete ADLs.  She is medically compliant.  She is unable to take her blood pressure at home.  She depends on blood pressures taken at providers offices.  The patient does not have symptoms concerning for COVID-19 infection (fever, chills, cough, or new shortness of breath).    Past Medical History:  Diagnosis Date  . Anemia   . Bronchogenic lung cancer, right ([place]) 11/29/2016  . CAD (coronary artery disease)    a. s/p LIMA-LAD in 11/2014  . Carotid artery occlusion   . CHF  (congestive heart failure) ([place])   . Complication of anesthesia    slow to awaken x 1 maybe 2001  . COPD (chronic obstructive pulmonary disease) ([place])   . GERD (gastroesophageal reflux disease)    "sometimes" takes Copywriter, advertising or drinks gingerale   . Headache(784.0)   . History of kidney stones   . Hyperlipidemia   . Hypertension   . Leg pain   . Peripheral vascular disease ([place])   . Renal vascular disease 10/26/2014   bilateral stents placed   . Severe mitral regurgitation    a. s/p MVR in 11/2014 with a pericardial tissue valve  . Shortness of breath dyspnea   . Tobacco abuse    Past Surgical History:  Procedure Laterality Date  . ABDOMINAL AORTOGRAM W/LOWER EXTREMITY Left 09/16/2018   Procedure: ABDOMINAL AORTOGRAM W/LOWER EXTREMITY;  Surgeon: Marty Heck, MD;  Location: [place] [facility];  Service: Cardiovascular;  Laterality: Left;  . ABDOMINAL HYSTERECTOMY    . ANGIOPLASTY / STENTING ILIAC  2010   right external iliac by Dr. Irish Lack  . CARDIAC CATHETERIZATION  11/13/11   Left Heart Cath. with Coronary Angiogram  . CARDIAC CATHETERIZATION N/A 08/24/2014   Procedure: Left Heart Cath and Coronary Angiography;  Surgeon: Wellington Hampshire, MD;  Location: [place] [facility];  Service: Cardiovascular;  Laterality: N/A;  . CARDIAC CATHETERIZATION N/A 10/26/2014   Procedure: Right/Left Heart Cath and Coronary Angiography;  Surgeon: Peter M Martinique, MD; oLAD 70%, mLAD 70% ISR, D2 30%, OFC 30%, RCA 20%, EF nl, low R heart pressures after diuresis, severe MR  . CAROTID ENDARTERECTOMY  11/21/2007   left  . COLONOSCOPY W/ POLYPECTOMY    . CORONARY ANGIOPLASTY WITH STENT PLACEMENT  6/09   LAD 2.5x12 [place]  . CORONARY ARTERY BYPASS GRAFT N/A 12/05/2014   Procedure: CORONARY ARTERY BYPASS GRAFTING (CABG);  Surgeon: [name], MD;  Location: [place];  Service: Open Heart Surgery;  Laterality: N/A;  Times 1 using left internal mammary artery to LAD  . FEMORAL-POPLITEAL BYPASS GRAFT   10/12   left Dr. Kellie Simmering  . LOWER EXTREMITY ANGIOGRAPHY N/A 02/04/2018   Procedure: LOWER EXTREMITY ANGIOGRAPHY;  Surgeon: Marty Heck, MD;  Location: [place] [facility];  Service: Cardiovascular;  Laterality: N/A;  . MITRAL VALVE REPAIR  2016  . MITRAL VALVE REPLACEMENT N/A 12/05/2014   Procedure: MITRAL VALVE (MV) REPLACEMENT;  Surgeon: [name], MD;  Location: [place];  Service: Open Heart Surgery;  Laterality: N/A;  Closure left atrial appendage  . PERIPHERAL VASCULAR BALLOON ANGIOPLASTY Bilateral 09/16/2018   Procedure: PERIPHERAL VASCULAR BALLOON ANGIOPLASTY;  Surgeon: Marty Heck, MD;  Location: [place] [facility];  Service: Cardiovascular;  Laterality: Bilateral;  bilateral renal arteries, Left external iliac  . PERIPHERAL VASCULAR INTERVENTION Left 02/04/2018   Procedure: PERIPHERAL VASCULAR INTERVENTION;  Surgeon: Marty Heck, MD;  Location: [place] [facility];  Service: Cardiovascular;  Laterality:  Left;  external iliac  . RENAL ANGIOGRAPHY Bilateral 09/16/2018   Procedure: RENAL ANGIOGRAPHY;  Surgeon: Marty Heck, MD;  Location: [place] [facility];  Service: Cardiovascular;  Laterality: Bilateral;  . RENAL ARTERY STENT Bilateral   . TEE WITHOUT CARDIOVERSION N/A 10/24/2014   Procedure: TRANSESOPHAGEAL ECHOCARDIOGRAM (TEE);  Surgeon: Thayer Headings, MD;  Location: [place];  Service: Cardiovascular;  Laterality: N/A;  . TEE WITHOUT CARDIOVERSION N/A 12/05/2014   Procedure: TRANSESOPHAGEAL ECHOCARDIOGRAM (TEE);  Surgeon: [name], MD;  Location: [place];  Service: Open Heart Surgery;  Laterality: N/A;  . VIDEO ASSISTED THORACOSCOPY (VATS)/WEDGE RESECTION Right 11/29/2016   Procedure: VIDEO ASSISTED THORACOSCOPY (VATS)/ RUL WEDGE RESECTION OF LESION/ NODE SAMPLING;  Surgeon: [name], MD;  Location: [place];  Service: Thoracic;  Laterality: Right;  Marland Kitchen VIDEO BRONCHOSCOPY N/A 11/29/2016   Procedure: VIDEO BRONCHOSCOPY;  Surgeon: [name], MD;  Location: [facility];  Service: Thoracic;  Laterality: N/A;     Current Meds  Medication Sig  . albuterol (PROVENTIL HFA;VENTOLIN HFA) 108 (90 Base) MCG/ACT inhaler Inhale 2 puffs into the lungs every 6 (six) hours as needed for wheezing or shortness of breath.  Marland Kitchen aspirin EC 81 MG tablet Take 1 tablet (81 mg total) by mouth daily.  Marland Kitchen atorvastatin (LIPITOR) 80 MG tablet Take 1 tablet (80 mg total) by mouth daily.  . clopidogrel (PLAVIX) 75 MG tablet Take 1 tablet by mouth once daily  . cyclobenzaprine (FLEXERIL) 10 MG tablet Take 1 tablet by mouth three times daily as needed for muscle spasm  . ezetimibe (ZETIA) 10 MG tablet Take 1 tablet (10 mg total) by mouth daily. (Patient taking differently: Take 10 mg by mouth daily at 3 pm. )  . furosemide (LASIX) 40 MG tablet Take 0.5 tablets (20 mg total) by mouth daily. (Patient taking differently: Take 40 mg by mouth every evening. )  . gabapentin (NEURONTIN) 100 MG capsule TAKE 2 CAPSULES BY MOUTH THREE TIMES DAILY  . isosorbide-hydrALAZINE (BIDIL) 20-37.5 MG tablet Take 1 tablet by mouth 3 (three) times daily.  . metoprolol succinate (TOPROL-XL) 25 MG 24 hr tablet Take 1 tablet (25 mg total) by mouth daily.  . nitroGLYCERIN (NITROSTAT) 0.4 MG SL tablet Place 1 tablet (0.4 mg total) under the tongue every 5 (five) minutes x 3 doses as needed for chest pain.  . potassium chloride SA (KLOR-CON) 20 MEQ tablet Take 2 tablets by mouth twice daily  . traMADol (ULTRAM) 50 MG tablet TAKE 1 TABLET BY MOUTH EVERY 6 HOURS AS NEEDED FOR PAIN  . traZODone (DESYREL) 100 MG tablet TAKE 1 TABLET BY MOUTH AT BEDTIME  . [DISCONTINUED] ondansetron (ZOFRAN) 4 MG tablet Take 1 tablet (4 mg total) by mouth every 8 (eight) hours as needed for nausea or vomiting.     Allergies:   Lisinopril, Chantix [varenicline], and Penicillins   Social History   Tobacco Use  . Smoking status: Current Every Day Smoker    Packs/day: 0.50    Years: 35.00    Pack years:  17.50    Types: Cigarettes  . Smokeless tobacco: Never Used  Substance Use Topics  . Alcohol use: Yes    Alcohol/week: 0.0 standard drinks    Comment: occasion  . Drug use: No     Family Hx: The patient's family history includes ALS in her brother; Cancer in her mother; Heart disease in her father; Hypertension in her brother, father, and mother; Kidney disease in her father; Prostate  cancer in her father; Stroke in her paternal aunt. There is no history of Heart attack, Colon cancer, Stomach cancer, Rectal cancer, Esophageal cancer, or Liver cancer.  ROS:   Please see the history of present illness.   All other systems reviewed and are negative.   Prior CV studies:   The following studies were reviewed today: Myoview 11/20/16: Study Highlights    Nuclear stress EF: 31%. The left ventricular ejection fraction is moderately decreased (30-44%).  Defect 1: There is a medium defect of moderate severity present in the apical anterior, apical septal, apical inferior and apex location.  Findings consistent with prior apical myocardial infarction.  This is a high risk study based on severely reduced LV function . There is no evidence of ischemia   Echo 01/13/17: Study Conclusions  - Left ventricle: The cavity size was normal. Wall thickness was normal. Systolic function was mildly to moderately reduced. The estimated ejection fraction was 40%. Diffuse hypokinesis. There was a reduced contribution of atrial contraction to ventricular filling, due to increased ventricular diastolic pressure or atrial contractile dysfunction. Doppler parameters are consistent with high ventricular filling pressure. - Aortic valve: There was mild regurgitation. - Mitral valve: A bioprosthesis was present. with normal function. Mean gradient (D): 2 mm Hg. Peak gradient (D): 10 mm Hg. - Left atrium: The atrium was moderately dilated. - Pulmonary arteries: PA peak pressure: 38 mm Hg (S).   09/16/2018 Pre-operative Diagnosis: 1. Bilateral renal artery in-stent restenosis greater than 70% 2. Left external iliac artery in-stent restenosis greater than 70% Post-operative diagnosis:Same Surgeon:Christopher Addison Naegeli, MD Procedure Performed: 1.Ultrasound-guided access of the right common femoral artery 2.Aortogramwith bilateral renal artery arteriogram 3.Right renal artery angioplasty (5 mm x 27mm Viatracand 6 mm x 31mm Viatrac) 4.Left renal artery angioplasty (4 mmx15 mmViatracand 6 mm x 24mmViatrac) 5.Left external iliac artery angioplasty (6 mm x 20 mm Mustang and 6 mm x 40 mm drug-coatedLutonix) 6.Limited left lower extremity arteriogram with selection of second-order branches 7.Mynx closure of the right common femoral artery 8.110 minutes of monitored moderate conscioussedationtime   Labs/Other Tests and Data Reviewed:    EKG:  No ECG reviewed.  Recent Labs: 04/28/2018: ALT 8; BNP 330.4; Platelets 194 09/16/2018: BUN 14; Creatinine, Ser 1.10; Hemoglobin 15.3; Potassium 3.8; Sodium 140   Recent Lipid Panel Lab Results  Component Value Date/Time   CHOL 168 04/28/2018 03:44 PM   TRIG 119 04/28/2018 03:44 PM   HDL 48 04/28/2018 03:44 PM   CHOLHDL 3.5 04/28/2018 03:44 PM   CHOLHDL 5.6 (H) 01/15/2016 11:17 AM   LDLCALC 96 04/28/2018 03:44 PM   LDLDIRECT 185 (A) 09/09/2011 08:20 AM    Wt Readings from Last 3 Encounters:  03/04/19 139 lb (63 kg)  10/20/18 159 lb (72.1 kg)  09/16/18 160 lb (72.6 kg)     Objective:    Vital Signs:  Ht 6' 1.5" (1.867 m)   Wt 139 lb (63 kg)   BMI 18.09 kg/m    GEN:  no acute distress PSYCH:  normal affect  ASSESSMENT & PLAN:    1.  Coronary artery disease: History of CABG in 2016, also mitral valve repair with pericardial tissue valve at the time of the CABG.  She is without complaints of chest pain, worsening shortness of breath.  She does have complaints of overall fatigue.  She is able to  complete her ADLs.  I will order labs today as she has not had any drawn recently, this will include a BMET and  a CBC.  2.  PAD: History of CEA and right external iliac stenting.  She denies any pain in her leg or neurologic deficits.  She remains on aspirin and Plavix without any complaints of bleeding or excessive bruising.  No changes in her regimen at this time.  CBC will be drawn.  3.  Hypertension: She is taking several antihypertensive medications to include isosorbide hydralazine 20/37.5 mg, 3 times daily, metoprolol 25 mg daily, as well as 40 mg of Lasix daily.  She is unable to take her blood pressure at home and depends on provider office visits to check it.  May need to consider sending her blood pressure machine so that she may check it at home and monitor this on her own as well.  4.  Hypercholesterolemia: Continues on Lipitor 80 mg daily.  She is also on Zetia 10 mg daily.  Goal of LDL less than 70.  Will need to have follow-up lipids and LFTs on next office visit.  4.  History of bronchogenic lung cancer on the right: She has had a right upper lobe wedge resection.  She is due to follow-up with Dr. Pia Mau tomorrow for ongoing evaluation and management.  COVID-19 Education: The signs and symptoms of COVID-19 were discussed with the patient and how to seek care for testing (follow up with PCP or arrange E-visit).  The importance of social distancing was discussed today.  Time:   Today, I have spent 15 minutes with the patient with telehealth technology discussing the above problems.     Medication Adjustments/Labs and Tests Ordered: Current medicines are reviewed at length with the patient today.  Concerns regarding medicines are outlined above.   Tests Ordered: No orders of the defined types were placed in this encounter.   Medication Changes: No orders of the defined types were placed in this encounter.   Disposition:  Follow up in 6 months  Signed, Phill Myron. West Pugh, ANP, AACC  03/04/2019 9:26 AM    [facility]

## 2019-03-03 ENCOUNTER — Encounter: Payer: Self-pay | Admitting: *Deleted

## 2019-03-03 ENCOUNTER — Ambulatory Visit: Payer: Medicare Other | Admitting: Adult Health

## 2019-03-04 ENCOUNTER — Encounter: Payer: Self-pay | Admitting: Adult Health

## 2019-03-04 ENCOUNTER — Telehealth (INDEPENDENT_AMBULATORY_CARE_PROVIDER_SITE_OTHER): Payer: Medicare Other | Admitting: Adult Health

## 2019-03-04 ENCOUNTER — Ambulatory Visit: Payer: Medicare Other | Admitting: Cardiothoracic Surgery

## 2019-03-04 ENCOUNTER — Ambulatory Visit: Payer: Medicare Other

## 2019-03-04 VITALS — Ht 73.5 in | Wt 139.0 lb

## 2019-03-04 DIAGNOSIS — E7800 Pure hypercholesterolemia, unspecified: Secondary | ICD-10-CM | POA: Diagnosis not present

## 2019-03-04 DIAGNOSIS — Z9862 Peripheral vascular angioplasty status: Secondary | ICD-10-CM

## 2019-03-04 DIAGNOSIS — Z952 Presence of prosthetic heart valve: Secondary | ICD-10-CM

## 2019-03-04 DIAGNOSIS — I10 Essential (primary) hypertension: Secondary | ICD-10-CM | POA: Diagnosis not present

## 2019-03-04 DIAGNOSIS — Z7982 Long term (current) use of aspirin: Secondary | ICD-10-CM

## 2019-03-04 DIAGNOSIS — I739 Peripheral vascular disease, unspecified: Secondary | ICD-10-CM

## 2019-03-04 DIAGNOSIS — I2510 Atherosclerotic heart disease of native coronary artery without angina pectoris: Secondary | ICD-10-CM | POA: Diagnosis not present

## 2019-03-04 DIAGNOSIS — Z85118 Personal history of other malignant neoplasm of bronchus and lung: Secondary | ICD-10-CM

## 2019-03-04 DIAGNOSIS — Z7902 Long term (current) use of antithrombotics/antiplatelets: Secondary | ICD-10-CM

## 2019-03-04 DIAGNOSIS — Z902 Acquired absence of lung [part of]: Secondary | ICD-10-CM

## 2019-03-04 DIAGNOSIS — C3491 Malignant neoplasm of unspecified part of right bronchus or lung: Secondary | ICD-10-CM

## 2019-03-04 DIAGNOSIS — I779 Disorder of arteries and arterioles, unspecified: Secondary | ICD-10-CM

## 2019-03-04 DIAGNOSIS — Z951 Presence of aortocoronary bypass graft: Secondary | ICD-10-CM

## 2019-03-04 NOTE — Progress Notes (Deleted)
ct      [address]       [place] [address]             [phone]                  Cilicia F Lamison Dunn Medical Record #[id number] Date of Birth: 1950-05-13  Referring NI:[id number], Edison Nasuti, MD Primary Cardiology:  Peter Martinique, MD Primary Care:Fletcher, Edison Nasuti, MD  Chief Complaint:  Follow Up Visit  Cancer Staging Bronchogenic lung cancer, right [facility]) Staging form: Lung, AJCC 8th Edition - Pathologic stage from 12/03/2016: Stage IA1 (pT1a, pN0, cM0) - Signed by [name], MD on 12/03/2016  11/29/2016 OPERATIVE REPORT PREOPERATIVE DIAGNOSIS:  Slowly enlarging right upper lobe lung nodule. POSTOPERATIVE DIAGNOSIS:  Slowly enlarging right upper lobe lung nodule, probably inflammatory nodule. PROCEDURES PERFORMED:  Bronchoscopy, right video-assisted thoracoscopy with wedge resection of right upper lobe lung lesion and lymph node sampling. SURGEON:  Lanelle Bal, MD  12/05/2014  OPERATIVE REPORT PREOPERATIVE DIAGNOSES: 1. Severe mitral insufficiency. 2. Aortic insufficiency. 3. Coronary occlusive disease. POSTOPERATIVE DIAGNOSES: 1. Severe mitral insufficiency. 2. Aortic insufficiency. 3. Coronary occlusive disease. 4. Aortic insufficiency, mild. PROCEDURE PERFORMED: Mitral valve replacement with [employer] pericardial tissue valve, model [id number] 29 mm, serial #[id number] and coronary artery bypass grafting x1 with the left internal mammary to the left anterior descending coronary artery. Closure of left atrial appendage. SURGEON: Lanelle Bal, MD.   History of Present Illness:     Patient returns office today in follow-up CT scanning of the chest for evaluation following wedge resection of right upper lobe for stage I non-small cell lung cancer.  Patient has stable congestive heart failure symptoms stage I-II.  She does get short of breath with significant exertion but is able to do usual daily activities without  difficulty.  Unfortunately she continues to smoke. She notes she is to come in for a procedure on to evaluate her renal arteries where she is previously had stents placed in the coming weeks.    Zubrod Score: At the time of surgery this patient's most appropriate activity status/level should be described as: []     0    Normal activity, no symptoms [x]     1    Restricted in physical strenuous activity but ambulatory, able to do out light work []     2    Ambulatory and capable of self care, unable to do work activities, up and about                 >50 % of waking hours                                                                                   []     3    Only limited self care, in bed greater than 50% of waking hours []     4    Completely disabled, no self care, confined to bed or chair []     5    Moribund  Social History   Tobacco Use  Smoking Status Current Every Day Smoker  . Packs/day: 0.50  . Years: 35.00  . Pack years: 17.50  .  Types: Cigarettes  Smokeless Tobacco Never Used       Allergies  Allergen Reactions  . Lisinopril Swelling    Angioedema 06/10/11  . Chantix [Varenicline] Other (See Comments)    Caused insomnia  . Penicillins Hives    Did it involve swelling of the face/tongue/throat, SOB, or low BP? No Did it involve sudden or severe rash/hives, skin peeling, or any reaction on the inside of your mouth or nose? No Did you need to seek medical attention at a hospital or doctor's office? No When did it last happen?50 Years If all above answers are "NO", may proceed with cephalosporin use.      Current Outpatient Medications  Medication Sig Dispense Refill  . albuterol (PROVENTIL HFA;VENTOLIN HFA) 108 (90 Base) MCG/ACT inhaler Inhale 2 puffs into the lungs every 6 (six) hours as needed for wheezing or shortness of breath. 1 Inhaler 0  . aspirin EC 81 MG tablet Take 1 tablet (81 mg total) by mouth daily. 90 tablet 1  . atorvastatin (LIPITOR) 80 MG  tablet Take 1 tablet (80 mg total) by mouth daily. 30 tablet 6  . clopidogrel (PLAVIX) 75 MG tablet Take 1 tablet by mouth once daily 30 tablet 6  . cyclobenzaprine (FLEXERIL) 10 MG tablet Take 1 tablet by mouth three times daily as needed for muscle spasm 180 tablet 0  . ezetimibe (ZETIA) 10 MG tablet Take 1 tablet (10 mg total) by mouth daily. (Patient taking differently: Take 10 mg by mouth daily at 3 pm. ) 90 tablet 3  . furosemide (LASIX) 40 MG tablet Take 0.5 tablets (20 mg total) by mouth daily. (Patient taking differently: Take 40 mg by mouth every evening. ) 30 tablet 6  . gabapentin (NEURONTIN) 100 MG capsule TAKE 2 CAPSULES BY MOUTH THREE TIMES DAILY 120 capsule 0  . isosorbide-hydrALAZINE (BIDIL) 20-37.5 MG tablet Take 1 tablet by mouth 3 (three) times daily. 90 tablet 11  . metoprolol succinate (TOPROL-XL) 25 MG 24 hr tablet Take 1 tablet (25 mg total) by mouth daily. 90 tablet 3  . nitroGLYCERIN (NITROSTAT) 0.4 MG SL tablet Place 1 tablet (0.4 mg total) under the tongue every 5 (five) minutes x 3 doses as needed for chest pain. 25 tablet 12  . potassium chloride SA (KLOR-CON) 20 MEQ tablet Take 2 tablets by mouth twice daily 120 tablet 5  . traMADol (ULTRAM) 50 MG tablet TAKE 1 TABLET BY MOUTH EVERY 6 HOURS AS NEEDED FOR PAIN 90 tablet 0  . traZODone (DESYREL) 100 MG tablet TAKE 1 TABLET BY MOUTH AT BEDTIME 90 tablet 0   No current facility-administered medications for this visit.       Physical Exam: There were no vitals taken for this visit. General appearance: alert, cooperative and appears older than stated age Head: Normocephalic, without obvious abnormality, atraumatic Neck: no adenopathy, no carotid bruit, no JVD, supple, symmetrical, trachea midline and thyroid not enlarged, symmetric, no tenderness/mass/nodules Lymph nodes: Cervical, supraclavicular, and axillary nodes normal. Resp: clear to auscultation bilaterally Back: symmetric, no curvature. ROM normal. No CVA  tenderness. GI: soft, non-tender; bowel sounds normal; no masses,  no organomegaly Extremities: extremities normal, atraumatic, no cyanosis or edema and Homans sign is negative, no sign of DVT Neurologic: Grossly normal Patient has grade 2 diastolic murmur along the left sternal border suggestive of aortic insufficiency sternum is stable and well-healed  Diagnostic Studies & Laboratory data:         Recent Radiology Findings:  No results found. Ct  Chest Wo Contrast  Result Date: 09/03/2018 CLINICAL DATA:  Follow-up right upper lobe lung cancer with VATS. EXAM: CT CHEST WITHOUT CONTRAST TECHNIQUE: Multidetector CT imaging of the chest was performed following the standard protocol without IV contrast. COMPARISON:  08/07/2017 FINDINGS: Cardiovascular: Sternotomy wires are present. Mild cardiomegaly. Mitral valve replacement. Calcified plaque over the left main and 3 vessel coronary arteries. Calcified plaque over the thoracic aorta. Slight left lateral projection from the aortic arch just distal to the takeoff of the left subclavian artery without significant change in may represent of small pseudoaneurysm. Mild dilatation of the descending thoracic aorta measuring 4.2 cm in AP diameter (previously 4 cm). Remaining vascular structures are unremarkable. Mediastinum/Nodes: No significant mediastinal or hilar adenopathy. Remaining mediastinal structures are unremarkable. Lungs/Pleura: Lungs are adequately inflated with postsurgical changes over the right upper lobe compatible with previous right upper lobe wedge resection for lung cancer. 4-5 mm triangular nodular density just inferior to the surgical site over the anterior right upper lobe slightly more prominent. No additional nodules or masses. Mild-to-moderate centrilobular emphysematous disease is present. No effusion. Minimal linear scarring over the lung bases. Airways are unremarkable. Upper Abdomen: Calcified plaque over the abdominal aorta.  Bilateral renal artery stents. Musculoskeletal: Mild degenerative change of the spine. IMPRESSION: Postsurgical change compatible previous right upper lobe wedge resection for lung cancer. 4-5 mm triangular nodular opacity just below the scar slightly more prominent. Recommend attention on follow-up to exclude metastatic nodule. No adenopathy. Aortic Atherosclerosis (ICD10-I70.0) and Emphysema (ICD10-J43.9). Stable contour abnormality over the left lateral aspect of the aortic arch possibly due to small pseudoaneurysm. Mild aneurysmal dilatation of the descending thoracic aorta measuring 4.2 cm in AP diameter (previously 4 cm). Recommend annual imaging followup by CTA or MRA. This recommendation follows 2010 ACCF/AHA/AATS/ACR/ASA/SCA/SCAI/SIR/STS/SVM Guidelines for the Diagnosis and Management of Patients with Thoracic Aortic Disease. Circulation. 2010; 121: V400-Q676. Aortic aneurysm NOS (ICD10-I71.9). Mild cardiomegaly. Left main and 3 vessel atherosclerotic coronary artery disease. Mitral valve replacement. Electronically Signed   By: Marin Olp M.D.   On: 09/03/2018 09:41  I have independently reviewed the above radiology studies  and reviewed the findings with the patient.   Vas [place] Burnard Bunting With/wo Tbi  Result Date: 09/01/2018 LOWER EXTREMITY DOPPLER STUDY Indications: Peripheral artery disease. High Risk Factors: Hypertension, hyperlipidemia, current smoker.  Vascular Interventions: Left EIA stents 02/04/18 Prior right common iliac artery                         stent. Performing Technologist: Ronal Fear RVS, RCS  Examination Guidelines: A complete evaluation includes at minimum, Doppler waveform signals and systolic blood pressure reading at the level of bilateral brachial, anterior tibial, and posterior tibial arteries, when vessel segments are accessible. Bilateral testing is considered an integral part of a complete examination. Photoelectric Plethysmograph (PPG) waveforms and toe systolic pressure  readings are included as required and additional duplex testing as needed. Limited examinations for reoccurring indications may be performed as noted.  ABI Findings: +---------+------------------+-----+----------+--------+ Right    Rt Pressure (mmHg)IndexWaveform  Comment  +---------+------------------+-----+----------+--------+ Brachial 144                                       +---------+------------------+-----+----------+--------+ PTA      88                0.58 monophasic         +---------+------------------+-----+----------+--------+  DP       84                0.56 monophasic         +---------+------------------+-----+----------+--------+ Great Toe65                0.43                    +---------+------------------+-----+----------+--------+ +---------+------------------+-----+--------+-------+ Left     Lt Pressure (mmHg)IndexWaveformComment +---------+------------------+-----+--------+-------+ Brachial 151                                    +---------+------------------+-----+--------+-------+ PTA      155               1.03 biphasic        +---------+------------------+-----+--------+-------+ DP       157               1.04 biphasic        +---------+------------------+-----+--------+-------+ Great Toe101               0.67                 +---------+------------------+-----+--------+-------+ +-------+-----------+-----------+------------+------------+ ABI/TBIToday's ABIToday's TBIPrevious ABIPrevious TBI +-------+-----------+-----------+------------+------------+ Right  0.58       0.43       0.62        0.44         +-------+-----------+-----------+------------+------------+ Left   1.04       0.67       1.0         0.66         +-------+-----------+-----------+------------+------------+  Summary: Right: Resting right ankle-brachial index indicates moderate right lower extremity arterial disease. The right toe-brachial index is  abnormal. Left: Resting left ankle-brachial index is within normal range. No evidence of significant left lower extremity arterial disease. The left toe-brachial index is abnormal.  *See table(s) above for measurements and observations.  Electronically signed by Monica Martinez MD on 09/01/2018 at 10:55:09 AM.    Final    Vas US Aorta/ivc/iliacs  Result Date: 09/01/2018 ABDOMINAL AORTA STUDY Indications: Surgery date 02/04/2018. Risk Factors: Hypertension, hyperlipidemia. Vascular Interventions: Left EIA stents 02/04/18 Prior right common iliac                         stent.  Performing Technologist: Ronal Fear RVS, RCS  Examination Guidelines: A complete evaluation includes B-mode imaging, spectral Doppler, color Doppler, and power Doppler as needed of all accessible portions of each vessel. Bilateral testing is considered an integral part of a complete examination. Limited examinations for reoccurring indications may be performed as noted.  Right Stent(s): +---------------+--------+---------------+----------+--------+ common iliac   PSV cm/sStenosis       Waveform  Comments +---------------+--------+---------------+----------+--------+ Prox to Stent  38                     monophasic         +---------------+--------+---------------+----------+--------+ Proximal Stent 108                    biphasic           +---------------+--------+---------------+----------+--------+ Mid Stent      162                    biphasic           +---------------+--------+---------------+----------+--------+  Distal Stent   157                    biphasic           +---------------+--------+---------------+----------+--------+ Distal to Stent211     50-99% stenosisbiphasic           +---------------+--------+---------------+----------+--------+  Left Stent(s): +---------------+--------+---------------+----------+--------+ external iliac PSV cm/sStenosis       Waveform  Comments  +---------------+--------+---------------+----------+--------+ Prox to Stent  420     50-99% stenosismonophasic         +---------------+--------+---------------+----------+--------+ Proximal Stent 187                    monophasic         +---------------+--------+---------------+----------+--------+ Mid Stent      166                    monophasic         +---------------+--------+---------------+----------+--------+ Distal Stent   168                    monophasic         +---------------+--------+---------------+----------+--------+ Distal to Stent                       monophasic         +---------------+--------+---------------+----------+--------+  Summary: Stenosis: +------------------+-------------+---------------+ Location          Stenosis     Stent           +------------------+-------------+---------------+ Right Common Iliac             50-99% stenosis +------------------+-------------+---------------+ Left Common Iliac >50% stenosis                +------------------+-------------+---------------+  *See table(s) above for measurements and observations.  Electronically signed by Monica Martinez MD on 09/01/2018 at 10:56:46 AM.    Final    Ct Chest Wo Contrast  Result Date: 08/07/2017 CLINICAL DATA:  Follow-up lung cancer. Shortness of breath. Right upper lobe wedge resection. EXAM: CT CHEST WITHOUT CONTRAST TECHNIQUE: Multidetector CT imaging of the chest was performed following the standard protocol without IV contrast. COMPARISON:  CT scans November 12, 2016 and June 05, 2017. FINDINGS: Cardiovascular: The heart is unchanged. Coronary artery calcifications are noted. The thoracic aorta is stable without significant aneurysm. Atherosclerotic changes noted. The central pulmonary artery is normal in caliber. Mediastinum/Nodes: The thyroid and esophagus are normal. No adenopathy in the chest. No effusions. Lungs/Pleura: Central airways are normal. No  pneumothorax. Mild emphysematous changes in the upper lobes. A suture line in the right upper lobe is consistent with right upper lobe wedge resection, unchanged. No evidence recurrence at the resection site. No suspicious nodules or masses. No suspicious infiltrates. Upper Abdomen: Nonobstructive stone in the right kidney. No other acute abnormalities in the upper abdomen. Musculoskeletal: No chest wall mass or suspicious bone lesions identified. IMPRESSION: 1. Previous wedge resection in the right upper lobe with no evidence of recurrence. 2. Atherosclerotic changes in the visualized aorta. Coronary artery calcifications. 3. Emphysematous changes particularly in the upper lobes but also the lower lobes. 4. Nonobstructive right renal stone. Aortic Atherosclerosis (ICD10-I70.0) and Emphysema (ICD10-J43.9). Electronically Signed   By: Dorise Bullion III M.D   On: 08/07/2017 17:59    Mild dilation of  ascending aorta  < 4.0 cm    Recent Labs: Lab Results  Component Value Date   WBC 13.5 (H) 04/28/2018   HGB 15.3 (H)  09/16/2018   HCT 45.0 09/16/2018   PLT 194 04/28/2018   GLUCOSE 107 (H) 09/16/2018   CHOL 168 04/28/2018   TRIG 119 04/28/2018   HDL 48 04/28/2018   LDLDIRECT 185 (A) 09/09/2011   LDLCALC 96 04/28/2018   ALT 8 04/28/2018   AST 9 04/28/2018   NA 140 09/16/2018   K 3.8 09/16/2018   CL 109 09/16/2018   CREATININE 1.10 (H) 09/16/2018   BUN 14 09/16/2018   CO2 24 04/28/2018   TSH 1.756 11/28/2014   INR 1.03 11/29/2016   HGBA1C 5.6 01/15/2016    Echo: 12/2016 Result status: Final result                           *[facility] [facility]*                        [address], [place] [address]                            [phone]  ------------------------------------------------------------------- Transthoracic Echocardiography  Patient:    Ski, Polich MR #:       [id number] Study Date: 01/13/2017 Gender:     F Age:         50 Height:     185.4 cm Weight:     80.3 kg BSA:        2.03 m^2 Pt. Status: Room:   SONOGRAPHER  Wyatt Mage, [place], Outpatient  ATTENDING    Ahmed Prima, Mancel Bale, [place], [place] M  cc:  ------------------------------------------------------------------- LV EF: 40%  ------------------------------------------------------------------- Indications:      Pre-op exam (Z01.81).  ------------------------------------------------------------------- History:   PMH:   Coronary artery disease.  Congestive heart failure.  Mitral valve disease.  Risk factors:  PVD. PVC. Former tobacco use. Hypertension.  ------------------------------------------------------------------- Study Conclusions  - Left ventricle: The cavity size was normal. Wall thickness was   normal. Systolic function was mildly to moderately reduced. The   estimated ejection fraction was 40%. Diffuse hypokinesis. There   was a reduced contribution of atrial contraction to ventricular   filling, due to increased ventricular diastolic pressure or   atrial contractile dysfunction. Doppler parameters are consistent   with high ventricular filling pressure. - Aortic valve: There was mild regurgitation. - Mitral valve: A bioprosthesis was present. with normal function.   Mean gradient (D): 2 mm Hg. Peak gradient (D): 10 mm Hg. - Left atrium: The atrium was moderately dilated. - Pulmonary arteries: PA peak pressure: 38 mm Hg (S).  ------------------------------------------------------------------- Labs, prior tests, procedures, and surgery: Transthoracic echocardiography (03/10/2015).     EF was 45%.  Valve surgery.     Mitral valve replacement with a bioprosthetic valve.  ------------------------------------------------------------------- Study data:  Comparison was made to the study of 03/10/2015.  Study status:  Routine.  Procedure:  The  patient reported no pain pre or post test. Transthoracic echocardiography. Image quality was adequate.  Study completion:  There were no complications. Transthoracic echocardiography.  M-mode, complete 2D, 3D, spectral Doppler, and color Doppler.  Birthdate:  Patient birthdate: 03/08/1951.  Age:  Patient is 68 yr old.  Sex:  Gender: female. BMI: 23.4 kg/m^2.  Blood pressure:     151/78  Patient status: Outpatient.  Study date:  Study date: 01/13/2017. Study time: 09:09 AM.  Location:  [facility]  -------------------------------------------------------------------  ------------------------------------------------------------------- Left ventricle:  The cavity size was normal. Wall thickness was normal. Systolic function was mildly to moderately reduced. The estimated ejection fraction was 40%. Diffuse hypokinesis. There was a reduced contribution of atrial contraction to ventricular filling, due to increased ventricular diastolic pressure or atrial contractile dysfunction. Doppler parameters are consistent with high ventricular filling pressure.  ------------------------------------------------------------------- Aortic valve:   Trileaflet; normal thickness leaflets. Mobility was not restricted.  Doppler:  Transvalvular velocity was within the normal range. There was no stenosis. There was mild regurgitation.   ------------------------------------------------------------------- Aorta:  The aorta was normal, not dilated, and non-diseased. Aortic root: The aortic root was normal in size.  ------------------------------------------------------------------- Mitral valve:  A bioprosthesis was present. with normal function. Mobility was not restricted.  Doppler:  Transvalvular velocity was within the normal range. There was no evidence for stenosis. mean gradient 2 mm Hg. There was no regurgitation.    Valve area by pressure half-time: 2.37 cm^2. Indexed valve area by  pressure half-time: 1.17 cm^2/m^2. Valve area by continuity equation (using LVOT flow): 1.97 cm^2. Indexed valve area by continuity equation (using LVOT flow): 0.97 cm^2/m^2.    Mean gradient (D): 2 mm Hg. Peak gradient (D): 10 mm Hg.  ------------------------------------------------------------------- Left atrium:  The atrium was moderately dilated.  ------------------------------------------------------------------- Atrial septum:  The septum was normal.  ------------------------------------------------------------------- Pulmonary veins: Common pulmonary vein:  The Doppler velocity and flow profile were normal.  ------------------------------------------------------------------- Right ventricle:  The cavity size was normal. Wall thickness was normal. Systolic function was normal.  ------------------------------------------------------------------- Pulmonic valve:    Structurally normal valve.   Cusp separation was normal.  Doppler:  Transvalvular velocity was within the normal range. There was no evidence for stenosis. There was no regurgitation.  ------------------------------------------------------------------- Tricuspid valve:   Structurally normal valve.   Leaflet separation was normal.  Doppler:  Transvalvular velocity was within the normal range. There was mild regurgitation.  ------------------------------------------------------------------- Pulmonary artery:   The main pulmonary artery was normal-sized. Systolic pressure was within the normal range.  ------------------------------------------------------------------- Right atrium:  The atrium was normal in size.  ------------------------------------------------------------------- Pericardium:  The pericardium was normal in appearance. There was no pericardial effusion.  ------------------------------------------------------------------- Systemic veins: Inferior vena cava: The vessel was normal in  size. The respirophasic diameter changes were in the normal range (= 50%), consistent with normal central venous pressure.  ------------------------------------------------------------------- Post procedure conclusions Ascending Aorta:  - The aorta was normal, not dilated, and non-diseased.  ------------------------------------------------------------------- Measurements   Left ventricle                            Value          Reference  LV ID, ED, PLAX chordal                   49.6  mm       43 - 52  LV ID, ES, PLAX chordal           (H)     42    mm       23 - 38  LV fx shortening, PLAX chordal    (L)     15    %        >=29  LV PW thickness, ED                       11.1  mm       ---------  IVS/LV PW ratio, ED                       1.02           <=1.3  Stroke volume, 2D                         58    ml       ---------  Stroke volume/bsa, 2D                     29    ml/m^2   ---------  LV e&', lateral                            3.43  cm/s     ---------  LV E/e&', lateral                          47.23          ---------  LV e&', medial                             4.6   cm/s     ---------  LV E/e&', medial                           35.22          ---------  LV e&', average                            4.02  cm/s     ---------  LV E/e&', average                          40.35          ---------    Ventricular septum                        Value          Reference  IVS thickness, ED                         11.3  mm       ---------    LVOT                                      Value          Reference  LVOT ID, S                                20    mm       ---------  LVOT area                                 3.14  cm^2     ---------  LVOT peak velocity, S                     96.4  cm/s     ---------  LVOT mean velocity, S                     57    cm/s     ---------  LVOT VTI, S                               18.6  cm       ---------    Aortic valve                               Value          Reference  Aortic regurg pressure half-time          504   ms       ---------    Aorta                                     Value          Reference  Aortic root ID, ED                        31    mm       ---------  Ascending aorta ID, A-P, S                30    mm       ---------    Left atrium                               Value          Reference  LA ID, A-P, ES                            48    mm       ---------  LA ID/bsa, A-P                    (H)     2.36  cm/m^2   <=2.2  LA volume, S                              61    ml       ---------  LA volume/bsa, S                          30    ml/m^2   ---------  LA volume, ES, 1-p A4C                    54.6  ml       ---------  LA volume/bsa, ES, 1-p A4C                26.8  ml/m^2   ---------  LA volume, ES, 1-p A2C  66    ml       ---------  LA volume/bsa, ES, 1-p A2C                32.4  ml/m^2   ---------    Mitral valve                              Value          Reference  Mitral E-wave peak velocity               162   cm/s     ---------  Mitral A-wave peak velocity               60.4  cm/s     ---------  Mitral mean velocity, D                   54.8  cm/s     ---------  Mitral deceleration time          (H)     268   ms       150 - 230  Mitral pressure half-time                 93    ms       ---------  Mitral mean gradient, D                   2     mm Hg    ---------  Mitral peak gradient, D                   10    mm Hg    ---------  Mitral E/A ratio, peak                    2.7            ---------  Mitral valve area, PHT, DP                2.37  cm^2     ---------  Mitral valve area/bsa, PHT, DP            1.17  cm^2/m^2 ---------  Mitral valve area, LVOT                   1.97  cm^2     ---------  continuity  Mitral valve area/bsa, LVOT               0.97  cm^2/m^2 ---------  continuity  Mitral annulus VTI, D                     29.6  cm       ---------    Pulmonary arteries                         Value          Reference  PA pressure, S, DP                (H)     38    mm Hg    <=30    Tricuspid valve                           Value          Reference  Tricuspid regurg  peak velocity            276   cm/s     ---------  Tricuspid peak RV-RA gradient             30    mm Hg    ---------    Systemic veins                            Value          Reference  Estimated CVP                             8     mm Hg    ---------    Right ventricle                           Value          Reference  TAPSE                                     17    mm       ---------  RV pressure, S, DP                (H)     38    mm Hg    <=30  RV s&', lateral, S                         6.02  cm/s     ---------  Legend: (L)  and  (H)  mark values outside specified reference range.  ------------------------------------------------------------------- Prepared and Electronically Authenticated by  Shirlee More, MD 2018-10-23T12:29:17    Assessment / Plan:        [name] 03/04/2019 2:14 PM

## 2019-03-04 NOTE — Patient Instructions (Addendum)
Medication Instructions:  Continue current medications  If you need a refill on your cardiac medications before your next appointment, please call your pharmacy.  Labwork: BMP and CBC  Testing/Procedures: None Ordered  Follow-Up: IN 6 months Please call our office 2 months in advance,  to schedule this  appointment. In Person Peter Martinique, MD.    At [facility], you and your health needs are our priority.  As part of our continuing mission to provide you with exceptional heart care, we have created designated Provider Care Teams.  These Care Teams include your primary Cardiologist (physician) and Advanced Practice Providers (APPs -  Physician Assistants and Nurse Practitioners) who all work together to provide you with the care you need, when you need it.  Thank you for choosing [facility] at [facility]!!       Happy Holidays!!

## 2019-03-15 ENCOUNTER — Ambulatory Visit
Admission: RE | Admit: 2019-03-15 | Discharge: 2019-03-15 | Disposition: A | Discharge status: Home / Self Care | Payer: Medicare Other | Source: Ambulatory Visit | Attending: Cardiothoracic Surgery | Admitting: Cardiothoracic Surgery

## 2019-03-15 DIAGNOSIS — C3490 Malignant neoplasm of unspecified part of unspecified bronchus or lung: Secondary | ICD-10-CM

## 2019-03-16 ENCOUNTER — Telehealth: Payer: Self-pay | Admitting: Cardiothoracic Surgery

## 2019-03-16 NOTE — Telephone Encounter (Signed)
Call patient and reviewed CT scan results that were done yesterday Scan appears stable no new findings. Patient has follow-up appointment in early January

## 2019-04-01 ENCOUNTER — Ambulatory Visit (INDEPENDENT_AMBULATORY_CARE_PROVIDER_SITE_OTHER): Payer: Medicare Other | Admitting: Cardiothoracic Surgery

## 2019-04-01 ENCOUNTER — Other Ambulatory Visit: Payer: Self-pay

## 2019-04-01 ENCOUNTER — Encounter: Payer: Self-pay | Admitting: Cardiothoracic Surgery

## 2019-04-01 VITALS — BP 95/64 | HR 74 | Temp 97.6°F | Resp 16 | Ht 73.0 in | Wt 149.1 lb

## 2019-04-01 DIAGNOSIS — C3411 Malignant neoplasm of upper lobe, right bronchus or lung: Secondary | ICD-10-CM

## 2019-04-01 DIAGNOSIS — Z09 Encounter for follow-up examination after completed treatment for conditions other than malignant neoplasm: Secondary | ICD-10-CM | POA: Diagnosis not present

## 2019-04-01 NOTE — Progress Notes (Signed)
ct      [address]       [place], [id number]             [phone]                  Laycee F Seefeld  Medical Record #[id number] Date of Birth: 1950/08/20  Referring VE:[id number], Edison Nasuti, MD Primary Cardiology:  Peter Martinique, MD Primary Care:Fletcher, Edison Nasuti, MD  Chief Complaint:  Follow Up Visit  Cancer Staging Bronchogenic lung cancer, right [facility]) Staging form: Lung, AJCC 8th Edition - Pathologic stage from 12/03/2016: Stage IA1 (pT1a, pN0, cM0) - Signed by [name], MD on 12/03/2016  11/29/2016 OPERATIVE REPORT PREOPERATIVE DIAGNOSIS:  Slowly enlarging right upper lobe lung nodule. POSTOPERATIVE DIAGNOSIS:  Slowly enlarging right upper lobe lung nodule, probably inflammatory nodule. PROCEDURES PERFORMED:  Bronchoscopy, right video-assisted thoracoscopy with wedge resection of right upper lobe lung lesion and lymph node sampling. SURGEON:  Lanelle Bal, MD  12/05/2014  OPERATIVE REPORT PREOPERATIVE DIAGNOSES: 1. Severe mitral insufficiency. 2. Aortic insufficiency. 3. Coronary occlusive disease. POSTOPERATIVE DIAGNOSES: 1. Severe mitral insufficiency. 2. Aortic insufficiency. 3. Coronary occlusive disease. 4. Aortic insufficiency, mild. PROCEDURE PERFORMED: Mitral valve replacement with [facility] pericardial tissue valve, model [id number] 29 mm, serial #[id number] and coronary artery bypass grafting x1 with the left internal mammary to the left anterior descending coronary artery. Closure of left atrial appendage. SURGEON: Lanelle Bal, MD.   History of Present Illness:     Patient returns to the office today after a follow-up CT scan done several weeks ago.  This is done as a follow-up evaluation for wedge resection of right upper lobe stage I non-small cell carcinoma of the lung.  Patient has stable congestive heart failure symptoms stage II.  She does get short of breath with exertion but is able to do  her daily activities.  She has noted some lower extremity weakness, but no rest pain.  Unfortunately she continues to smoke   Last echocardiogram was in 2018  Zubrod Score: At the time of surgery this patient's most appropriate activity status/level should be described as: []     0    Normal activity, no symptoms [x]     1    Restricted in physical strenuous activity but ambulatory, able to do out light work []     2    Ambulatory and capable of self care, unable to do work activities, up and about                 >50 % of waking hours                                                                                   []     3    Only limited self care, in bed greater than 50% of waking hours []     4    Completely disabled, no self care, confined to bed or chair []     5    Moribund  Social History   Tobacco Use  Smoking Status Current Every Day Smoker  . Packs/day: 0.50  . Years: 35.00  . Pack years: 17.50  .  Types: Cigarettes  Smokeless Tobacco Never Used       Allergies  Allergen Reactions  . Lisinopril Swelling    Angioedema 06/10/11  . Chantix [Varenicline] Other (See Comments)    Caused insomnia  . Penicillins Hives    Did it involve swelling of the face/tongue/throat, SOB, or low BP? No Did it involve sudden or severe rash/hives, skin peeling, or any reaction on the inside of your mouth or nose? No Did you need to seek medical attention at a hospital or doctor's office? No When did it last happen?50 Years If all above answers are "NO", may proceed with cephalosporin use.      Current Outpatient Medications  Medication Sig Dispense Refill  . albuterol (PROVENTIL HFA;VENTOLIN HFA) 108 (90 Base) MCG/ACT inhaler Inhale 2 puffs into the lungs every 6 (six) hours as needed for wheezing or shortness of breath. 1 Inhaler 0  . aspirin EC 81 MG tablet Take 1 tablet (81 mg total) by mouth daily. 90 tablet 1  . atorvastatin (LIPITOR) 80 MG tablet Take 1 tablet (80 mg total)  by mouth daily. 30 tablet 6  . clopidogrel (PLAVIX) 75 MG tablet Take 1 tablet by mouth once daily 30 tablet 6  . cyclobenzaprine (FLEXERIL) 10 MG tablet Take 1 tablet by mouth three times daily as needed for muscle spasm 180 tablet 0  . ezetimibe (ZETIA) 10 MG tablet Take 1 tablet (10 mg total) by mouth daily. (Patient taking differently: Take 10 mg by mouth daily at 3 pm. ) 90 tablet 3  . furosemide (LASIX) 40 MG tablet Take 0.5 tablets (20 mg total) by mouth daily. (Patient taking differently: Take 40 mg by mouth every evening. ) 30 tablet 6  . gabapentin (NEURONTIN) 100 MG capsule TAKE 2 CAPSULES BY MOUTH THREE TIMES DAILY 120 capsule 0  . isosorbide-hydrALAZINE (BIDIL) 20-37.5 MG tablet Take 1 tablet by mouth 3 (three) times daily. 90 tablet 11  . metoprolol succinate (TOPROL-XL) 25 MG 24 hr tablet Take 1 tablet (25 mg total) by mouth daily. 90 tablet 3  . nitroGLYCERIN (NITROSTAT) 0.4 MG SL tablet Place 1 tablet (0.4 mg total) under the tongue every 5 (five) minutes x 3 doses as needed for chest pain. 25 tablet 12  . potassium chloride SA (KLOR-CON) 20 MEQ tablet Take 2 tablets by mouth twice daily 120 tablet 5  . traMADol (ULTRAM) 50 MG tablet TAKE 1 TABLET BY MOUTH EVERY 6 HOURS AS NEEDED FOR PAIN 90 tablet 0  . traZODone (DESYREL) 100 MG tablet TAKE 1 TABLET BY MOUTH AT BEDTIME 90 tablet 0   No current facility-administered medications for this visit.       Physical Exam: BP 95/64 (BP Location: Left Arm, Patient Position: Sitting, Cuff Size: Normal)   Pulse 74   Temp 97.6 F (36.4 C)   Resp 16   Ht 6\' 1"  (1.854 m)   Wt 67.6 kg   SpO2 97% Comment: RA  BMI 19.67 kg/m  General appearance: alert, cooperative and no distress Head: Normocephalic, without obvious abnormality, atraumatic Neck: no adenopathy, no carotid bruit, no JVD, supple, symmetrical, trachea midline and thyroid not enlarged, symmetric, no tenderness/mass/nodules Lymph nodes: Cervical, supraclavicular, and  axillary nodes normal. Resp: clear to auscultation bilaterally Cardio: regular rate and rhythm, S1, S2 normal, no murmur, click, rub or gallop GI: soft, non-tender; bowel sounds normal; no masses,  no organomegaly Extremities: Patient has reasonable strength in both lower extremities is able to ambulate, no pedal edema, he has  palpable posterior tibial pulses bilaterally I do not feel dorsalis pedis pulses bilaterally Neurologic: Grossly normal   Diagnostic Studies & Laboratory data:         Recent Radiology Findings:  CT CHEST WO CONTRAST  Result Date: 03/15/2019 CLINICAL DATA:  Small cell lung cancer, follow-up study EXAM: CT CHEST WITHOUT CONTRAST TECHNIQUE: Multidetector CT imaging of the chest was performed following the standard protocol without IV contrast. COMPARISON:  09/03/2018 FINDINGS: Cardiovascular: Calcified atherosclerotic plaque throughout the thoracic aorta with 4 cm dilation of the proximal portion of the descending thoracic aorta is unchanged. Irregularity of the arch with mild contour bulging along the left lateral aspect of the arch is similar to the previous exam. Extensive coronary artery disease and changes of mitral valve replacement as before. No signs of pericardial effusion. Central pulmonary artery stable at approximately 3.1 cm. Vascular structures not well assessed given lack of intravenous contrast. Changes of LIMA grafting along the left heart border are similar to the prior exam. Mediastinum/Nodes: Scattered small lymph nodes in the chest unchanged from previous exam. Lungs/Pleura: Signs of pulmonary emphysema and signs of moderate to marked pulmonary emphysema worse at the lung apices. Partial lung resection in the right upper lobe with chain sutures showing no interval change. Respiratory motion limits assessment of the lung bases. No signs of consolidation or pleural effusion. No new mass or nodule. Upper Abdomen: Incidental imaging of upper abdominal contents is  unremarkable. Musculoskeletal: Post sternotomy without acute or destructive bone findings. IMPRESSION: 1. Stable CT appearance of the chest without evidence of recurrent or metastatic disease. 2. Signs of pulmonary emphysema and signs of right upper lobe partial lung resection as before. 3. Stable 4 cm dilation of the proximal descending thoracic aorta. Stable lobular contour of the aortic arch may represent penetrating ulcer or asymmetric dilation and atherosclerotic changes of the thoracic aorta. Attention on follow-up, not changed in greater than 1 year. Fall. 4. Coronary artery disease, post LIMA grafting and changes of mitral valve replacement as before. Aortic Atherosclerosis (ICD10-I70.0) and Emphysema (ICD10-J43.9). Electronically Signed   By: Zetta Bills M.D.   On: 03/15/2019 13:18   I have independently reviewed the above radiology studies  and reviewed the findings with the patient.   Ct Chest Wo Contrast  Result Date: 09/03/2018 CLINICAL DATA:  Follow-up right upper lobe lung cancer with VATS. EXAM: CT CHEST WITHOUT CONTRAST TECHNIQUE: Multidetector CT imaging of the chest was performed following the standard protocol without IV contrast. COMPARISON:  08/07/2017 FINDINGS: Cardiovascular: Sternotomy wires are present. Mild cardiomegaly. Mitral valve replacement. Calcified plaque over the left main and 3 vessel coronary arteries. Calcified plaque over the thoracic aorta. Slight left lateral projection from the aortic arch just distal to the takeoff of the left subclavian artery without significant change in may represent of small pseudoaneurysm. Mild dilatation of the descending thoracic aorta measuring 4.2 cm in AP diameter (previously 4 cm). Remaining vascular structures are unremarkable. Mediastinum/Nodes: No significant mediastinal or hilar adenopathy. Remaining mediastinal structures are unremarkable. Lungs/Pleura: Lungs are adequately inflated with postsurgical changes over the right upper  lobe compatible with previous right upper lobe wedge resection for lung cancer. 4-5 mm triangular nodular density just inferior to the surgical site over the anterior right upper lobe slightly more prominent. No additional nodules or masses. Mild-to-moderate centrilobular emphysematous disease is present. No effusion. Minimal linear scarring over the lung bases. Airways are unremarkable. Upper Abdomen: Calcified plaque over the abdominal aorta. Bilateral renal artery stents. Musculoskeletal: Mild degenerative  change of the spine. IMPRESSION: Postsurgical change compatible previous right upper lobe wedge resection for lung cancer. 4-5 mm triangular nodular opacity just below the scar slightly more prominent. Recommend attention on follow-up to exclude metastatic nodule. No adenopathy. Aortic Atherosclerosis (ICD10-I70.0) and Emphysema (ICD10-J43.9). Stable contour abnormality over the left lateral aspect of the aortic arch possibly due to small pseudoaneurysm. Mild aneurysmal dilatation of the descending thoracic aorta measuring 4.2 cm in AP diameter (previously 4 cm). Recommend annual imaging followup by CTA or MRA. This recommendation follows 2010 ACCF/AHA/AATS/ACR/ASA/SCA/SCAI/SIR/STS/SVM Guidelines for the Diagnosis and Management of Patients with Thoracic Aortic Disease. Circulation. 2010; 121: W263-Z858. Aortic aneurysm NOS (ICD10-I71.9). Mild cardiomegaly. Left main and 3 vessel atherosclerotic coronary artery disease. Mitral valve replacement. Electronically Signed   By: Marin Olp M.D.   On: 09/03/2018 09:41  I have independently reviewed the above radiology studies  and reviewed the findings with the patient.   Vas [place] Burnard Bunting With/wo Tbi  Ct Chest Wo Contrast  Result Date: 08/07/2017 CLINICAL DATA:  Follow-up lung cancer. Shortness of breath. Right upper lobe wedge resection. EXAM: CT CHEST WITHOUT CONTRAST TECHNIQUE: Multidetector CT imaging of the chest was performed following the standard protocol  without IV contrast. COMPARISON:  CT scans November 12, 2016 and June 05, 2017. FINDINGS: Cardiovascular: The heart is unchanged. Coronary artery calcifications are noted. The thoracic aorta is stable without significant aneurysm. Atherosclerotic changes noted. The central pulmonary artery is normal in caliber. Mediastinum/Nodes: The thyroid and esophagus are normal. No adenopathy in the chest. No effusions. Lungs/Pleura: Central airways are normal. No pneumothorax. Mild emphysematous changes in the upper lobes. A suture line in the right upper lobe is consistent with right upper lobe wedge resection, unchanged. No evidence recurrence at the resection site. No suspicious nodules or masses. No suspicious infiltrates. Upper Abdomen: Nonobstructive stone in the right kidney. No other acute abnormalities in the upper abdomen. Musculoskeletal: No chest wall mass or suspicious bone lesions identified. IMPRESSION: 1. Previous wedge resection in the right upper lobe with no evidence of recurrence. 2. Atherosclerotic changes in the visualized aorta. Coronary artery calcifications. 3. Emphysematous changes particularly in the upper lobes but also the lower lobes. 4. Nonobstructive right renal stone. Aortic Atherosclerosis (ICD10-I70.0) and Emphysema (ICD10-J43.9). Electronically Signed   By: Dorise Bullion III M.D   On: 08/07/2017 17:59    Mild dilation of  ascending aorta  < 4.0 cm    Recent Labs: Lab Results  Component Value Date   WBC 13.5 (H) 04/28/2018   HGB 15.3 (H) 09/16/2018   HCT 45.0 09/16/2018   PLT 194 04/28/2018   GLUCOSE 107 (H) 09/16/2018   CHOL 168 04/28/2018   TRIG 119 04/28/2018   HDL 48 04/28/2018   LDLDIRECT 185 (A) 09/09/2011   LDLCALC 96 04/28/2018   ALT 8 04/28/2018   AST 9 04/28/2018   NA 140 09/16/2018   K 3.8 09/16/2018   CL 109 09/16/2018   CREATININE 1.10 (H) 09/16/2018   BUN 14 09/16/2018   CO2 24 04/28/2018   TSH 1.756 11/28/2014   INR 1.03 11/29/2016   HGBA1C 5.6  01/15/2016       Assessment / Plan:    Patient on CT scan shows no evidence of recurrent carcinoma of the lung following previous wedge resection of the right upper lobe non-small cell lung cancer stage I. The patient continues to be followed in vascular clinic for peripheral vascular disease and cardiology Last echocardiogram was 2018 And see her back in 1  year with a follow-up CT of the chest for evaluation of her lung resection    [name] 04/01/2019 12:32 PM

## 2019-04-12 ENCOUNTER — Encounter: Payer: Self-pay | Admitting: Family Medicine

## 2019-04-12 ENCOUNTER — Other Ambulatory Visit: Payer: Self-pay

## 2019-04-12 ENCOUNTER — Ambulatory Visit (INDEPENDENT_AMBULATORY_CARE_PROVIDER_SITE_OTHER): Payer: Medicare Other | Admitting: Family Medicine

## 2019-04-12 DIAGNOSIS — R0602 Shortness of breath: Secondary | ICD-10-CM | POA: Diagnosis not present

## 2019-04-12 MED ORDER — FLOVENT HFA 44 MCG/ACT IN AERO: 1.0000 | INHALATION_SPRAY | Freq: Every day | RESPIRATORY_TRACT | 0 refills | Status: AC
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
## 2019-04-15 DIAGNOSIS — R0600 Dyspnea, unspecified: Secondary | ICD-10-CM | POA: Insufficient documentation

## 2019-04-15 NOTE — Progress Notes (Signed)
   HPI 69 year old female who presents for increased shortness of breath. She states that she shortness of breath occurs on exertion such as going up stairs and walking for long distances. She states that the shortness of breath is always accompanied by wheezing. She has been using her albuterol more often recently, stating that she has been using it almost daily. No other symptoms. The difficulty with breathing resolves with rest and with wheezing. She has continued smoking.  CC: increased shortness of breath   ROS:   Review of Systems See HPI for ROS.   CC, SH/smoking status, and VS noted  Objective: BP 118/78   Pulse 67   Wt 143 lb (64.9 kg)   SpO2 99%   BMI 18.87 kg/m   Gen: 69 year old AA female, no acute distress, comfortable CV: rrr, no m/r/g. Resp: lungs clear to auscultation bilaterally aside from very mild wheezing, no accessory muscle use Abd: SNTND, BS present, no guarding or organomegaly Ext: No edema, warm Neuro: Alert and oriented, Speech clear, No gross deficits   Assessment and plan:  Shortness of breath More wheezing and shortness of breath especially at night. Likely consistent with copd worsening due to weather changes and continued smoking. No PFTs to make formal diagnosis. Will add on flovent inhaler, can continue albuterol inhaler. - flovent 2mcg 1 puff daily - albuterol 2 puffs q 6 hours as needed - could consider formal pfts   No orders of the defined types were placed in this encounter.   Meds ordered this encounter  Medications  . fluticasone (FLOVENT HFA) 44 MCG/ACT inhaler    Sig: Inhale 1 puff into the lungs daily.    Dispense:  2 Inhaler    Refill:  0   Guadalupe Dawn MD PGY-3 Family Medicine Resident  04/15/2019 12:51 PM

## 2019-04-15 NOTE — Assessment & Plan Note (Signed)
More wheezing and shortness of breath especially at night. Likely consistent with copd worsening due to weather changes and continued smoking. No PFTs to make formal diagnosis. Will add on flovent inhaler, can continue albuterol inhaler. - flovent 49mcg 1 puff daily - albuterol 2 puffs q 6 hours as needed - could consider formal pfts

## 2019-04-26 ENCOUNTER — Ambulatory Visit (HOSPITAL_COMMUNITY)
Admission: RE | Admit: 2019-04-26 | Discharge: 2019-04-26 | Disposition: A | Discharge status: Home / Self Care | Payer: Medicare Other | Source: Ambulatory Visit | Attending: Cardiology | Admitting: Cardiology

## 2019-04-26 ENCOUNTER — Other Ambulatory Visit: Payer: Self-pay | Admitting: Cardiology

## 2019-04-26 ENCOUNTER — Other Ambulatory Visit: Payer: Self-pay

## 2019-04-26 DIAGNOSIS — I6523 Occlusion and stenosis of bilateral carotid arteries: Secondary | ICD-10-CM

## 2019-04-26 DIAGNOSIS — Z9889 Other specified postprocedural states: Secondary | ICD-10-CM

## 2019-04-28 NOTE — Progress Notes (Signed)
Date:  04/29/2019   ID:  SALLIE STARON, DOB 10-31-1950, MRN [id number]   PCP:  Guadalupe Dawn, MD  Cardiologist:  Brysun Eschmann Martinique, MD  Electrophysiologist:  None   Evaluation Performed:  Follow-Up Visit  Chief Complaint:  Follow up CAD  History of Present Illness:    Leah [name] is a 69 y.o. female with a past medical history of CAD (s/p LIMA-LAD in 11/2014), MVR (in 11/2014 with a pericardial tissue valve), HTN, HLD, COPD, and PVD (s/p L CEA and right external iliac stenting), CHF.  In August 2018 she had pre op evaluation with a Myoview study that showed no ischemic and reduced EF. She had a right upper lobe lung nodule which had increased in size. She underwent resection in September 2018 with pathology showing adenocarcinoma stage 1. Negative margins. Echo performed in October showed EF 40% with normally functioning Mitral valve prosthesis.   She was admitted in March 2019 with CHF exacerbation. Responded to IV diuresis. Continued on other medical therapy. CT of the chest showed no PE. Discharge weight 168 lbs.   On 02/04/18 she underwent LE angiogram for left leg claudication. She had PTA/stenting of the left external iliac artery by Dr. Fortunato Curling. She was also noted to have an 80% right renal artery stenosis. This was a difficult procedure because she had severe back spasms. Her potassium was also low.   Carotid dopplers on 04/24/18 showed no change in bilateral moderate carotid disease.   On her last visit she had an episode of altered mental status. Not clear if she passed out. Event monitor showed PVCs, bigeminy, and 5 and 7 beat runs of NSVT. No symptoms.   In June of this year seen by [facility]. Noted increased velocity in left iliac stent. History of left fem-pop BPG. She had repeat angiogram showing stenosis in the left external iliac artery and in bilateral renal artery stents. This was all treated with balloon angioplasty.   Also followed by Dr Servando Snare for  follow up lung CA. Plan CT chest in 6 months.  On follow up today she reports that 2 weeks ago she became very ill. Had N/V and difficulty swallowing. Was SOB, states she was hallucinating and couldn't walk. Arms and legs were cold. She doesn't know what was wrong but now is feeling much better. She does have some left flank pain. Has lost 5 lbs. No edema. No chest pain. No cough.    Past Medical History:  Diagnosis Date   Anemia    Bronchogenic lung cancer, right ([place]) 11/29/2016   CAD (coronary artery disease)    a. s/p LIMA-LAD in 11/2014   Carotid artery occlusion    CHF (congestive heart failure) (HCC)    Complication of anesthesia    slow to awaken x 1 maybe 2001   COPD (chronic obstructive pulmonary disease) ([facility])    GERD (gastroesophageal reflux disease)    "sometimes" takes Alka Seltzer or drinks gingerale    Headache(784.0)    History of kidney stones    Hyperlipidemia    Hypertension    Leg pain    Peripheral vascular disease ([place])    Renal vascular disease 10/26/2014   bilateral stents placed    Severe mitral regurgitation    a. s/p MVR in 11/2014 with a pericardial tissue valve   Shortness of breath dyspnea    Tobacco abuse    Past Surgical History:  Procedure Laterality Date   ABDOMINAL AORTOGRAM W/LOWER EXTREMITY Left 09/16/2018  Procedure: ABDOMINAL AORTOGRAM W/LOWER EXTREMITY;  Surgeon: Marty Heck, MD;  Location: [facility];  Service: Cardiovascular;  Laterality: Left;   ABDOMINAL HYSTERECTOMY     ANGIOPLASTY / STENTING ILIAC  2010   right external iliac by Dr. Irish Lack   CARDIAC CATHETERIZATION  11/13/11   Left Heart Cath. with Coronary Angiogram   CARDIAC CATHETERIZATION N/A 08/24/2014   Procedure: Left Heart Cath and Coronary Angiography;  Surgeon: Wellington Hampshire, MD;  Location: [facility];  Service: Cardiovascular;  Laterality: N/A;   CARDIAC CATHETERIZATION N/A 10/26/2014   Procedure: Right/Left Heart Cath  and Coronary Angiography;  Surgeon: Blimy Napoleon M Martinique, MD; oLAD 70%, mLAD 70% ISR, D2 30%, OFC 30%, RCA 20%, EF nl, low R heart pressures after diuresis, severe MR   CAROTID ENDARTERECTOMY  11/21/2007   left   COLONOSCOPY W/ POLYPECTOMY     CORONARY ANGIOPLASTY WITH STENT PLACEMENT  6/09   LAD 2.5x12 Promus   CORONARY ARTERY BYPASS GRAFT N/A 12/05/2014   Procedure: CORONARY ARTERY BYPASS GRAFTING (CABG);  Surgeon: [name], MD;  Location: [facility];  Service: Open Heart Surgery;  Laterality: N/A;  Times 1 using left internal mammary artery to LAD   FEMORAL-POPLITEAL BYPASS GRAFT  10/12   left Dr. Kellie Simmering   LOWER EXTREMITY ANGIOGRAPHY N/A 02/04/2018   Procedure: LOWER EXTREMITY ANGIOGRAPHY;  Surgeon: Marty Heck, MD;  Location: [facility];  Service: Cardiovascular;  Laterality: N/A;   MITRAL VALVE REPAIR  2016   MITRAL VALVE REPLACEMENT N/A 12/05/2014   Procedure: MITRAL VALVE (MV) REPLACEMENT;  Surgeon: [name], MD;  Location: [facility];  Service: Open Heart Surgery;  Laterality: N/A;  Closure left atrial appendage   PERIPHERAL VASCULAR BALLOON ANGIOPLASTY Bilateral 09/16/2018   Procedure: PERIPHERAL VASCULAR BALLOON ANGIOPLASTY;  Surgeon: Marty Heck, MD;  Location: [facility];  Service: Cardiovascular;  Laterality: Bilateral;  bilateral renal arteries, Left external iliac   PERIPHERAL VASCULAR INTERVENTION Left 02/04/2018   Procedure: PERIPHERAL VASCULAR INTERVENTION;  Surgeon: Marty Heck, MD;  Location: [facility];  Service: Cardiovascular;  Laterality: Left;  external iliac   RENAL ANGIOGRAPHY Bilateral 09/16/2018   Procedure: RENAL ANGIOGRAPHY;  Surgeon: Marty Heck, MD;  Location: [facility];  Service: Cardiovascular;  Laterality: Bilateral;   RENAL ARTERY STENT Bilateral    TEE WITHOUT CARDIOVERSION N/A 10/24/2014   Procedure: TRANSESOPHAGEAL ECHOCARDIOGRAM (TEE);  Surgeon: Thayer Headings, MD;  Location:  [facility];  Service: Cardiovascular;  Laterality: N/A;   TEE WITHOUT CARDIOVERSION N/A 12/05/2014   Procedure: TRANSESOPHAGEAL ECHOCARDIOGRAM (TEE);  Surgeon: [name], MD;  Location: [facility];  Service: Open Heart Surgery;  Laterality: N/A;   VIDEO ASSISTED THORACOSCOPY (VATS)/WEDGE RESECTION Right 11/29/2016   Procedure: VIDEO ASSISTED THORACOSCOPY (VATS)/ RUL WEDGE RESECTION OF LESION/ NODE SAMPLING;  Surgeon: [name], MD;  Location: [facility];  Service: Thoracic;  Laterality: Right;   VIDEO BRONCHOSCOPY N/A 11/29/2016   Procedure: VIDEO BRONCHOSCOPY;  Surgeon: [name], MD;  Location: [facility];  Service: Thoracic;  Laterality: N/A;     Current Meds  Medication Sig   albuterol (PROVENTIL HFA;VENTOLIN HFA) 108 (90 Base) MCG/ACT inhaler Inhale 2 puffs into the lungs every 6 (six) hours as needed for wheezing or shortness of breath.   aspirin EC 81 MG tablet Take 1 tablet (81 mg total) by mouth daily.   clopidogrel (PLAVIX) 75 MG tablet Take 1 tablet by mouth once  daily   cyclobenzaprine (FLEXERIL) 10 MG tablet Take 1 tablet by mouth three times daily as needed for muscle spasm   fluticasone (FLOVENT HFA) 44 MCG/ACT inhaler Inhale 1 puff into the lungs daily.   furosemide (LASIX) 40 MG tablet Take 0.5 tablets (20 mg total) by mouth daily. (Patient taking differently: Take 40 mg by mouth every evening. )   gabapentin (NEURONTIN) 100 MG capsule TAKE 2 CAPSULES BY MOUTH THREE TIMES DAILY   isosorbide-hydrALAZINE (BIDIL) 20-37.5 MG tablet Take 1 tablet by mouth 3 (three) times daily.   metoprolol succinate (TOPROL-XL) 25 MG 24 hr tablet Take 1 tablet (25 mg total) by mouth daily.   nitroGLYCERIN (NITROSTAT) 0.4 MG SL tablet Place 1 tablet (0.4 mg total) under the tongue every 5 (five) minutes x 3 doses as needed for chest pain.   potassium chloride SA (KLOR-CON) 20 MEQ tablet Take 2 tablets by mouth twice daily   traMADol (ULTRAM) 50 MG tablet TAKE 1 TABLET BY MOUTH  EVERY 6 HOURS AS NEEDED FOR PAIN   traZODone (DESYREL) 100 MG tablet TAKE 1 TABLET BY MOUTH AT BEDTIME     Allergies:   Lisinopril, Chantix [varenicline], and Penicillins   Social History   Tobacco Use   Smoking status: Current Every Day Smoker    Packs/day: 0.50    Years: 35.00    Pack years: 17.50    Types: Cigarettes   Smokeless tobacco: Never Used  Substance Use Topics   Alcohol use: Yes    Alcohol/week: 0.0 standard drinks    Comment: occasion   Drug use: No     Family Hx: The patient's family history includes ALS in her brother; Cancer in her mother; Heart disease in her father; Hypertension in her brother, father, and mother; Kidney disease in her father; Prostate cancer in her father; Stroke in her paternal aunt. There is no history of Heart attack, Colon cancer, Stomach cancer, Rectal cancer, Esophageal cancer, or Liver cancer.  ROS:   Please see the history of present illness.    All other systems reviewed and are negative.   Prior CV studies:   The following studies were reviewed today:  Myoview 11/20/16: Study Highlights    Nuclear stress EF: 31%. The left ventricular ejection fraction is moderately decreased (30-44%).  Defect 1: There is a medium defect of moderate severity present in the apical anterior, apical septal, apical inferior and apex location.  Findings consistent with prior apical myocardial infarction.  This is a high risk study based on severely reduced LV function . There is no evidence of ischemia   Echo 01/13/17: Study Conclusions  - Left ventricle: The cavity size was normal. Wall thickness was normal. Systolic function was mildly to moderately reduced. The estimated ejection fraction was 40%. Diffuse hypokinesis. There was a reduced contribution of atrial contraction to ventricular filling, due to increased ventricular diastolic pressure or atrial contractile dysfunction. Doppler parameters are consistent with high  ventricular filling pressure. - Aortic valve: There was mild regurgitation. - Mitral valve: A bioprosthesis was present. with normal function. Mean gradient (D): 2 mm Hg. Peak gradient (D): 10 mm Hg. - Left atrium: The atrium was moderately dilated. - Pulmonary arteries: PA peak pressure: 38 mm Hg (S).  09/16/2018 Pre-operative Diagnosis:  1.  Bilateral renal artery in-stent restenosis greater than 70% 2.  Left external iliac artery in-stent restenosis greater than 70% Post-operative diagnosis:  Same Surgeon:  Marty Heck, MD Procedure Performed: 1.  Ultrasound-guided access of the right  common femoral artery 2.  Aortogram with bilateral renal artery arteriogram 3.  Right renal artery angioplasty (5 mm x 15 mm Viatrac and 6 mm x 15 mm Viatrac) 4.  Left renal artery angioplasty (4 mm x 15 mm Viatrac and 6 mm x 15mm Viatrac) 5.  Left external iliac artery angioplasty (6 mm x 20 mm Mustang and 6 mm x 40 mm drug-coated Lutonix) 6.  Limited left lower extremity arteriogram with selection of second-order branches 7.  Mynx closure of the right common femoral artery  8.  110 minutes of monitored moderate conscious sedation time  Indications: Patient is a 69 year old female well-known to the vascular surgery service.  She was recently seen for follow-up surveillance after left iliac intervention for recurrent claudication in the setting of a left femoral to below-knee popliteal bypass in 2012.  Ultimately on surveillance imaging she had evidence of a greater than 70% stenosis just proximal to her left external iliac stent.  In addition she has bilateral renal artery stents that were placed in 2012 with evidence of >70% stenosis in both stents.  She presents today for a bilateral renal artery intervention in addition to left iliac intervention after risks and benefits were discussed.  Findings:  Aortogram showed a greater than 95% proximal stenosis in the right renal artery stent that was  a 6 mm x 12 mm Herculink.  The left renal artery stent had approximately 70% stenosis and this was a 6 mm x 15 mm Herculink.  I initially ballooned the right renal artery stent with a 5 mm and then a 6 mm angioplasty balloon and there is now less than 10 to 20% residual stenosis.  On the left I could not get a 6 mm  to track and had to use initially a 4 mm ballon and then upsized this to a 6 mm angioplasty balloon.  There was approximately 30% residual stenosis.  Both renal stents and renal arteries remained patent at the end of the case with no dissections or other issues on completion aortogram. Left external iliac stent had a flow-limiting stenosis in its proximal portion at the takeoff of the left hypogastric.  This was treated with a 6 mm angioplasty balloon and then a 6 mm drug-coated Lutonix balloon.  I did not see any reason I needed to extend the stent up into the common iliac artery given the common iliac was widely patent.  I did a limited left lower extremity arteriogram and had preserved runoff down the left femoral to below-knee popliteal bypass. I did a limited right femoral arteriogram just to ensure that I could deploy a mynx closure device in the right common femoral artery.  Also of note the right common iliac stent is patent although it extends above the aortic bifurcation.  Her profunda is the right groinis patent but she has a very small diminuteSFA takeoff.  EXAM: CT CHEST WITHOUT CONTRAST  TECHNIQUE: Multidetector CT imaging of the chest was performed following the standard protocol without IV contrast.  COMPARISON:  09/03/2018  FINDINGS: Cardiovascular: Calcified atherosclerotic plaque throughout the thoracic aorta with 4 cm dilation of the proximal portion of the descending thoracic aorta is unchanged. Irregularity of the arch with mild contour bulging along the left lateral aspect of the arch is similar to the previous exam. Extensive coronary artery disease and  changes of mitral valve replacement as before. No signs of pericardial effusion. Central pulmonary artery stable at approximately 3.1 cm. Vascular structures not well assessed given lack  of intravenous contrast.  Changes of LIMA grafting along the left heart border are similar to the prior exam.  Mediastinum/Nodes: Scattered small lymph nodes in the chest unchanged from previous exam.  Lungs/Pleura: Signs of pulmonary emphysema and signs of moderate to marked pulmonary emphysema worse at the lung apices. Partial lung resection in the right upper lobe with chain sutures showing no interval change. Respiratory motion limits assessment of the lung bases.  No signs of consolidation or pleural effusion. No new mass or nodule.  Upper Abdomen: Incidental imaging of upper abdominal contents is unremarkable.  Musculoskeletal: Post sternotomy without acute or destructive bone findings.  IMPRESSION: 1. Stable CT appearance of the chest without evidence of recurrent or metastatic disease. 2. Signs of pulmonary emphysema and signs of right upper lobe partial lung resection as before. 3. Stable 4 cm dilation of the proximal descending thoracic aorta. Stable lobular contour of the aortic arch may represent penetrating ulcer or asymmetric dilation and atherosclerotic changes of the thoracic aorta. Attention on follow-up, not changed in greater than 1 year. Fall. 4. Coronary artery disease, post LIMA grafting and changes of mitral valve replacement as before.  Aortic Atherosclerosis (ICD10-I70.0) and Emphysema (ICD10-J43.9).   Electronically Signed   By: Zetta Bills M.D.   On: 03/15/2019 13:18   Labs/Other Tests and Data Reviewed:    EKG:  No ECG reviewed.  Recent Labs: 09/16/2018: BUN 14; Creatinine, Ser 1.10; Hemoglobin 15.3; Potassium 3.8; Sodium 140   Recent Lipid Panel Lab Results  Component Value Date/Time   CHOL 168 04/28/2018 03:44 PM   TRIG 119  04/28/2018 03:44 PM   HDL 48 04/28/2018 03:44 PM   CHOLHDL 3.5 04/28/2018 03:44 PM   CHOLHDL 5.6 (H) 01/15/2016 11:17 AM   LDLCALC 96 04/28/2018 03:44 PM   LDLDIRECT 185 (A) 09/09/2011 08:20 AM    Wt Readings from Last 3 Encounters:  04/29/19 138 lb (62.6 kg)  04/12/19 143 lb (64.9 kg)  04/01/19 149 lb 1 oz (67.6 kg)     Objective:    Vital Signs:  BP 138/72    Pulse 92    Temp (!) 96.3 F (35.7 C)    Ht 6\' 1"  (1.854 m)    Wt 138 lb (62.6 kg)    SpO2 100%    BMI 18.21 kg/m   GENERAL:  Well appearing, thin BF in NAD HEENT:  PERRL, EOMI, sclera are clear. Oropharynx is clear. NECK:  No jugular venous distention, carotid upstroke brisk and symmetric, no bruits, no thyromegaly or adenopathy LUNGS:  Clear to auscultation bilaterally CHEST:  Unremarkable HEART:  RRR,  PMI not displaced or sustained,S1 and S2 within normal limits, no S3, no S4: no clicks, no rubs, soft systolic murmur RUSB ABD:  Soft, nontender. BS +, no masses or bruits. No hepatomegaly, no splenomegaly EXT:  2 + femoral pulse, poor pedal pulses. no edema, no cyanosis no clubbing SKIN:  Warm and dry.  No rashes NEURO:  Alert and oriented x 3. Cranial nerves II through XII intact. PSYCH:  Cognitively intact VITAL SIGNS:  reviewed  ASSESSMENT & PLAN:    1. CAD - s/p LIMA-LAD in 11/2014.  - no significant angina -  Lexiscan Myoview in August 2018 showed no ischemia but reduced EF   - continue ASA, statin, and BB therapy.   2. S/p MVR - performed in in 11/2014 with a pericardial tissue valve. Echo in August 2018 showed a stable prosthesis with no obvious MR.   3. Chronic systolic  CHF EF 40%. In review of records she had EF 45-50% post MVR. "normal" EF prior to surgery but I suspect there was latent LV dysfunction in setting of severe MR. Follow up Echo in October 2018 showed EF 40%.  She has class 2 symptoms.  She is not a candidate for ACEi or ARB due to history of angioedema on lisinopril. Continue Bidil 20/37.5  mg tid. and metoprolol. On  lasix to 40 mg daily.    4. HTN - controlled.  5. HLD - Lipitor has resulted in 50-60% reduction in LDL when she is taking it. Last LDL 100. She reports compliance with lipitor and Zetia. Update lab work today   6. PVD -  s/p L CEA and left fem pop BPG. Bilateral carotid bruits. Carotid dopplers in January showed moderate bilateral disease. No AAA. S/p  left external iliac stenting November 2019. Marland Kitchen RAS s/p bilateral stenting - s/p repeat intervention for bilateral RAS and left iliac stenosis in June.  - very discouraged with progressive vascular disease - stressed importance of smoking cessation.  - continue ASA, Plavix, and statin therapy.   7. Tobacco Use - still smoking 0.5 ppd. Encouraged complete smoking cessation  8. Adenocarcinoma lung. S/p resection. Stage 1. No recurrence by CT.  9. Palpitations: PVCs and PACs noted on Ecg.  Take potassium supplement daily. If symptoms of tachycardia persist may need to consider event monitor.   10. Acute illness 2 weeks ago of uncertain etiology. Afebrile today. Will check CBC, UA and chemistries.       Follow Up:  In Person in 3 months   Signed, Thao Vanover Martinique, MD  04/29/2019 3:25 PM    [place]

## 2019-04-29 ENCOUNTER — Ambulatory Visit (INDEPENDENT_AMBULATORY_CARE_PROVIDER_SITE_OTHER): Payer: Medicare Other | Admitting: Cardiology

## 2019-04-29 ENCOUNTER — Other Ambulatory Visit: Payer: Self-pay

## 2019-04-29 ENCOUNTER — Encounter: Payer: Self-pay | Admitting: Cardiology

## 2019-04-29 ENCOUNTER — Other Ambulatory Visit: Payer: Self-pay | Admitting: Family Medicine

## 2019-04-29 VITALS — BP 138/72 | HR 92 | Temp 96.3°F | Ht 73.0 in | Wt 138.0 lb

## 2019-04-29 DIAGNOSIS — G8918 Other acute postprocedural pain: Secondary | ICD-10-CM

## 2019-04-29 DIAGNOSIS — Z952 Presence of prosthetic heart valve: Secondary | ICD-10-CM | POA: Diagnosis not present

## 2019-04-29 DIAGNOSIS — R109 Unspecified abdominal pain: Secondary | ICD-10-CM | POA: Diagnosis not present

## 2019-04-29 DIAGNOSIS — I6523 Occlusion and stenosis of bilateral carotid arteries: Secondary | ICD-10-CM | POA: Diagnosis not present

## 2019-04-29 DIAGNOSIS — Z951 Presence of aortocoronary bypass graft: Secondary | ICD-10-CM | POA: Diagnosis not present

## 2019-04-29 DIAGNOSIS — I5042 Chronic combined systolic (congestive) and diastolic (congestive) heart failure: Secondary | ICD-10-CM | POA: Diagnosis not present

## 2019-04-29 DIAGNOSIS — G8929 Other chronic pain: Secondary | ICD-10-CM

## 2019-04-29 DIAGNOSIS — I739 Peripheral vascular disease, unspecified: Secondary | ICD-10-CM | POA: Diagnosis not present

## 2019-04-30 ENCOUNTER — Telehealth: Payer: Self-pay

## 2019-04-30 ENCOUNTER — Other Ambulatory Visit: Payer: Self-pay

## 2019-04-30 DIAGNOSIS — R109 Unspecified abdominal pain: Secondary | ICD-10-CM

## 2019-04-30 DIAGNOSIS — I10 Essential (primary) hypertension: Secondary | ICD-10-CM

## 2019-04-30 LAB — BASIC METABOLIC PANEL
BUN/Creatinine Ratio: 16 (ref 12–28)
BUN: 29 mg/dL — ABNORMAL HIGH (ref 8–27)
CO2: 22 mmol/L (ref 20–29)
Calcium: 9.4 mg/dL (ref 8.7–10.3)
Chloride: 91 mmol/L — ABNORMAL LOW (ref 96–106)
Creatinine, Ser: 1.83 mg/dL — ABNORMAL HIGH (ref 0.57–1.00)
GFR calc Af Amer: 33 mL/min/{1.73_m2} — ABNORMAL LOW (ref >59)
GFR calc non Af Amer: 28 mL/min/{1.73_m2} — ABNORMAL LOW (ref >59)
Glucose: 126 mg/dL — ABNORMAL HIGH (ref 65–99)
Potassium: 2.8 mmol/L — CL (ref 3.5–5.2)
Sodium: 134 mmol/L (ref 134–144)

## 2019-04-30 LAB — LIPID PANEL
Chol/HDL Ratio: 4.4 ratio (ref 0.0–4.4)
Cholesterol, Total: 146 mg/dL (ref 100–199)
HDL: 33 mg/dL — ABNORMAL LOW (ref >39)
LDL Chol Calc (NIH): 94 mg/dL (ref 0–99)
Triglycerides: 99 mg/dL (ref 0–149)
VLDL Cholesterol Cal: 19 mg/dL (ref 5–40)

## 2019-04-30 LAB — HEPATIC FUNCTION PANEL
ALT: 17 IU/L (ref 0–32)
AST: 15 IU/L (ref 0–40)
Albumin: 3.9 g/dL (ref 3.8–4.8)
Alkaline Phosphatase: 121 IU/L — ABNORMAL HIGH (ref 39–117)
Bilirubin Total: 1.6 mg/dL — ABNORMAL HIGH (ref 0.0–1.2)
Bilirubin, Direct: 0.89 mg/dL — ABNORMAL HIGH (ref 0.00–0.40)
Total Protein: 6.9 g/dL (ref 6.0–8.5)

## 2019-04-30 LAB — CBC WITH DIFFERENTIAL/PLATELET
Basophils Absolute: 0.1 10*3/uL (ref 0.0–0.2)
Basos: 1 %
EOS (ABSOLUTE): 0.1 10*3/uL (ref 0.0–0.4)
Eos: 1 %
Hematocrit: 43.2 % (ref 34.0–46.6)
Hemoglobin: 14 g/dL (ref 11.1–15.9)
Immature Grans (Abs): 0.1 10*3/uL (ref 0.0–0.1)
Immature Granulocytes: 1 %
Lymphocytes Absolute: 2.3 10*3/uL (ref 0.7–3.1)
Lymphs: 23 %
MCH: 25.4 pg — ABNORMAL LOW (ref 26.6–33.0)
MCHC: 32.4 g/dL (ref 31.5–35.7)
MCV: 78 fL — ABNORMAL LOW (ref 79–97)
Monocytes Absolute: 0.7 10*3/uL (ref 0.1–0.9)
Monocytes: 7 %
Neutrophils Absolute: 6.8 10*3/uL (ref 1.4–7.0)
Neutrophils: 67 %
Platelets: 187 10*3/uL (ref 150–450)
RBC: 5.52 x10E6/uL — ABNORMAL HIGH (ref 3.77–5.28)
RDW: 19.6 % — ABNORMAL HIGH (ref 11.7–15.4)
WBC: 10 10*3/uL (ref 3.4–10.8)

## 2019-04-30 NOTE — Telephone Encounter (Signed)
Already spoke to patient.She knows to take potassium 20 meq 2 tablets twice a day.She will crush tablets and take with applesauce.She will have repeat bmet 05/05/19.

## 2019-04-30 NOTE — Progress Notes (Signed)
bmet  

## 2019-04-30 NOTE — Telephone Encounter (Signed)
Received call from [place] of critical results on potassium- 2.8. will route to MD to make aware.

## 2019-05-06 DIAGNOSIS — I10 Essential (primary) hypertension: Secondary | ICD-10-CM | POA: Diagnosis not present

## 2019-05-06 DIAGNOSIS — R109 Unspecified abdominal pain: Secondary | ICD-10-CM | POA: Diagnosis not present

## 2019-05-07 ENCOUNTER — Telehealth: Payer: Self-pay | Admitting: Internal Medicine

## 2019-05-07 ENCOUNTER — Other Ambulatory Visit: Payer: Self-pay

## 2019-05-07 DIAGNOSIS — I10 Essential (primary) hypertension: Secondary | ICD-10-CM | POA: Diagnosis not present

## 2019-05-07 DIAGNOSIS — R109 Unspecified abdominal pain: Secondary | ICD-10-CM | POA: Diagnosis not present

## 2019-05-07 DIAGNOSIS — E876 Hypokalemia: Secondary | ICD-10-CM

## 2019-05-07 LAB — BASIC METABOLIC PANEL
BUN/Creatinine Ratio: 16 (ref 12–28)
BUN: 28 mg/dL — ABNORMAL HIGH (ref 8–27)
CO2: 18 mmol/L — ABNORMAL LOW (ref 20–29)
Calcium: 9.5 mg/dL (ref 8.7–10.3)
Chloride: 95 mmol/L — ABNORMAL LOW (ref 96–106)
Creatinine, Ser: 1.78 mg/dL — ABNORMAL HIGH (ref 0.57–1.00)
GFR calc Af Amer: 34 mL/min/{1.73_m2} — ABNORMAL LOW (ref >59)
GFR calc non Af Amer: 29 mL/min/{1.73_m2} — ABNORMAL LOW (ref >59)
Glucose: 88 mg/dL (ref 65–99)
Potassium: 2.7 mmol/L — CL (ref 3.5–5.2)
Sodium: 138 mmol/L (ref 134–144)

## 2019-05-07 LAB — URINALYSIS
Bilirubin, UA: NEGATIVE
Glucose, UA: NEGATIVE
Nitrite, UA: NEGATIVE
RBC, UA: NEGATIVE
Specific Gravity, UA: 1.018 (ref 1.005–1.030)
Urobilinogen, Ur: 1 mg/dL (ref 0.2–1.0)
pH, UA: 6 (ref 5.0–7.5)

## 2019-05-07 NOTE — Telephone Encounter (Signed)
Cardiology Moonlighter Note  Returned page from [place]. K 2.7. Routing to Dr. Martinique for follow up during daytime.   Marcie Mowers, MD Cardiology Fellow, PGY-7

## 2019-05-07 NOTE — Telephone Encounter (Signed)
Already spoke to patient.Kdur increased to 40 meq 4 times a day.Hold Lasix the the next 2 days.Repeat bmet in 1 week.

## 2019-05-18 ENCOUNTER — Emergency Department (HOSPITAL_BASED_OUTPATIENT_CLINIC_OR_DEPARTMENT_OTHER): Payer: Medicare Other

## 2019-05-18 ENCOUNTER — Other Ambulatory Visit: Payer: Self-pay

## 2019-05-18 ENCOUNTER — Emergency Department (HOSPITAL_COMMUNITY): Payer: Medicare Other

## 2019-05-18 ENCOUNTER — Encounter (HOSPITAL_COMMUNITY): Payer: Self-pay | Admitting: Emergency Medicine

## 2019-05-18 ENCOUNTER — Inpatient Hospital Stay (HOSPITAL_COMMUNITY): Payer: Medicare Other

## 2019-05-18 ENCOUNTER — Inpatient Hospital Stay (HOSPITAL_COMMUNITY)
Admission: EM | Admit: 2019-05-18 | Discharge: 2019-06-03 | DRG: 314 | Disposition: A | Discharge status: Skilled Nursing Facility | Payer: Medicare Other | Attending: Internal Medicine | Admitting: Internal Medicine

## 2019-05-18 DIAGNOSIS — R7989 Other specified abnormal findings of blood chemistry: Secondary | ICD-10-CM

## 2019-05-18 DIAGNOSIS — C3491 Malignant neoplasm of unspecified part of right bronchus or lung: Secondary | ICD-10-CM | POA: Diagnosis not present

## 2019-05-18 DIAGNOSIS — R42 Dizziness and giddiness: Secondary | ICD-10-CM | POA: Diagnosis not present

## 2019-05-18 DIAGNOSIS — R1312 Dysphagia, oropharyngeal phase: Secondary | ICD-10-CM | POA: Diagnosis not present

## 2019-05-18 DIAGNOSIS — I409 Acute myocarditis, unspecified: Secondary | ICD-10-CM | POA: Diagnosis present

## 2019-05-18 DIAGNOSIS — Z452 Encounter for adjustment and management of vascular access device: Secondary | ICD-10-CM | POA: Diagnosis not present

## 2019-05-18 DIAGNOSIS — I5082 Biventricular heart failure: Secondary | ICD-10-CM | POA: Diagnosis not present

## 2019-05-18 DIAGNOSIS — I509 Heart failure, unspecified: Secondary | ICD-10-CM

## 2019-05-18 DIAGNOSIS — K5730 Diverticulosis of large intestine without perforation or abscess without bleeding: Secondary | ICD-10-CM | POA: Diagnosis not present

## 2019-05-18 DIAGNOSIS — Z90710 Acquired absence of both cervix and uterus: Secondary | ICD-10-CM

## 2019-05-18 DIAGNOSIS — E871 Hypo-osmolality and hyponatremia: Secondary | ICD-10-CM | POA: Diagnosis not present

## 2019-05-18 DIAGNOSIS — I400 Infective myocarditis: Principal | ICD-10-CM | POA: Diagnosis present

## 2019-05-18 DIAGNOSIS — I5042 Chronic combined systolic (congestive) and diastolic (congestive) heart failure: Secondary | ICD-10-CM | POA: Diagnosis present

## 2019-05-18 DIAGNOSIS — I10 Essential (primary) hypertension: Secondary | ICD-10-CM | POA: Diagnosis not present

## 2019-05-18 DIAGNOSIS — K219 Gastro-esophageal reflux disease without esophagitis: Secondary | ICD-10-CM | POA: Diagnosis present

## 2019-05-18 DIAGNOSIS — I493 Ventricular premature depolarization: Secondary | ICD-10-CM | POA: Diagnosis present

## 2019-05-18 DIAGNOSIS — Z7401 Bed confinement status: Secondary | ICD-10-CM | POA: Diagnosis not present

## 2019-05-18 DIAGNOSIS — I708 Atherosclerosis of other arteries: Secondary | ICD-10-CM | POA: Diagnosis present

## 2019-05-18 DIAGNOSIS — Z66 Do not resuscitate: Secondary | ICD-10-CM | POA: Diagnosis not present

## 2019-05-18 DIAGNOSIS — M6281 Muscle weakness (generalized): Secondary | ICD-10-CM | POA: Diagnosis not present

## 2019-05-18 DIAGNOSIS — Y92230 Patient room in hospital as the place of occurrence of the external cause: Secondary | ICD-10-CM | POA: Diagnosis not present

## 2019-05-18 DIAGNOSIS — A419 Sepsis, unspecified organism: Secondary | ICD-10-CM

## 2019-05-18 DIAGNOSIS — Z841 Family history of disorders of kidney and ureter: Secondary | ICD-10-CM

## 2019-05-18 DIAGNOSIS — Z79899 Other long term (current) drug therapy: Secondary | ICD-10-CM

## 2019-05-18 DIAGNOSIS — Z87442 Personal history of urinary calculi: Secondary | ICD-10-CM

## 2019-05-18 DIAGNOSIS — N2889 Other specified disorders of kidney and ureter: Secondary | ICD-10-CM | POA: Diagnosis present

## 2019-05-18 DIAGNOSIS — I5022 Chronic systolic (congestive) heart failure: Secondary | ICD-10-CM

## 2019-05-18 DIAGNOSIS — Z952 Presence of prosthetic heart valve: Secondary | ICD-10-CM | POA: Diagnosis not present

## 2019-05-18 DIAGNOSIS — R0789 Other chest pain: Secondary | ICD-10-CM | POA: Diagnosis not present

## 2019-05-18 DIAGNOSIS — Z8249 Family history of ischemic heart disease and other diseases of the circulatory system: Secondary | ICD-10-CM

## 2019-05-18 DIAGNOSIS — Z880 Allergy status to penicillin: Secondary | ICD-10-CM

## 2019-05-18 DIAGNOSIS — E43 Unspecified severe protein-calorie malnutrition: Secondary | ICD-10-CM | POA: Diagnosis not present

## 2019-05-18 DIAGNOSIS — M2550 Pain in unspecified joint: Secondary | ICD-10-CM | POA: Diagnosis not present

## 2019-05-18 DIAGNOSIS — F17210 Nicotine dependence, cigarettes, uncomplicated: Secondary | ICD-10-CM | POA: Diagnosis present

## 2019-05-18 DIAGNOSIS — I340 Nonrheumatic mitral (valve) insufficiency: Secondary | ICD-10-CM | POA: Diagnosis present

## 2019-05-18 DIAGNOSIS — I959 Hypotension, unspecified: Secondary | ICD-10-CM | POA: Diagnosis not present

## 2019-05-18 DIAGNOSIS — E785 Hyperlipidemia, unspecified: Secondary | ICD-10-CM | POA: Diagnosis present

## 2019-05-18 DIAGNOSIS — Z515 Encounter for palliative care: Secondary | ICD-10-CM | POA: Diagnosis not present

## 2019-05-18 DIAGNOSIS — I351 Nonrheumatic aortic (valve) insufficiency: Secondary | ICD-10-CM | POA: Diagnosis present

## 2019-05-18 DIAGNOSIS — R000 Tachycardia, unspecified: Secondary | ICD-10-CM | POA: Diagnosis not present

## 2019-05-18 DIAGNOSIS — H9193 Unspecified hearing loss, bilateral: Secondary | ICD-10-CM | POA: Diagnosis present

## 2019-05-18 DIAGNOSIS — Z20822 Contact with and (suspected) exposure to covid-19: Secondary | ICD-10-CM | POA: Diagnosis present

## 2019-05-18 DIAGNOSIS — I429 Cardiomyopathy, unspecified: Secondary | ICD-10-CM | POA: Diagnosis present

## 2019-05-18 DIAGNOSIS — I5043 Acute on chronic combined systolic (congestive) and diastolic (congestive) heart failure: Secondary | ICD-10-CM

## 2019-05-18 DIAGNOSIS — R570 Cardiogenic shock: Secondary | ICD-10-CM | POA: Diagnosis not present

## 2019-05-18 DIAGNOSIS — R64 Cachexia: Secondary | ICD-10-CM | POA: Diagnosis not present

## 2019-05-18 DIAGNOSIS — I130 Hypertensive heart and chronic kidney disease with heart failure and stage 1 through stage 4 chronic kidney disease, or unspecified chronic kidney disease: Secondary | ICD-10-CM | POA: Diagnosis not present

## 2019-05-18 DIAGNOSIS — I5020 Unspecified systolic (congestive) heart failure: Secondary | ICD-10-CM

## 2019-05-18 DIAGNOSIS — R0602 Shortness of breath: Secondary | ICD-10-CM | POA: Diagnosis not present

## 2019-05-18 DIAGNOSIS — Z8719 Personal history of other diseases of the digestive system: Secondary | ICD-10-CM

## 2019-05-18 DIAGNOSIS — N179 Acute kidney failure, unspecified: Secondary | ICD-10-CM | POA: Diagnosis not present

## 2019-05-18 DIAGNOSIS — N1831 Chronic kidney disease, stage 3a: Secondary | ICD-10-CM | POA: Diagnosis not present

## 2019-05-18 DIAGNOSIS — D6959 Other secondary thrombocytopenia: Secondary | ICD-10-CM | POA: Diagnosis not present

## 2019-05-18 DIAGNOSIS — R609 Edema, unspecified: Secondary | ICD-10-CM

## 2019-05-18 DIAGNOSIS — N390 Urinary tract infection, site not specified: Secondary | ICD-10-CM | POA: Diagnosis not present

## 2019-05-18 DIAGNOSIS — W19XXXA Unspecified fall, initial encounter: Secondary | ICD-10-CM | POA: Diagnosis not present

## 2019-05-18 DIAGNOSIS — E869 Volume depletion, unspecified: Secondary | ICD-10-CM | POA: Diagnosis present

## 2019-05-18 DIAGNOSIS — Z955 Presence of coronary angioplasty implant and graft: Secondary | ICD-10-CM

## 2019-05-18 DIAGNOSIS — R4182 Altered mental status, unspecified: Secondary | ICD-10-CM | POA: Diagnosis not present

## 2019-05-18 DIAGNOSIS — I701 Atherosclerosis of renal artery: Secondary | ICD-10-CM | POA: Diagnosis present

## 2019-05-18 DIAGNOSIS — E872 Acidosis: Secondary | ICD-10-CM | POA: Diagnosis present

## 2019-05-18 DIAGNOSIS — I214 Non-ST elevation (NSTEMI) myocardial infarction: Secondary | ICD-10-CM

## 2019-05-18 DIAGNOSIS — R1314 Dysphagia, pharyngoesophageal phase: Secondary | ICD-10-CM | POA: Diagnosis present

## 2019-05-18 DIAGNOSIS — J449 Chronic obstructive pulmonary disease, unspecified: Secondary | ICD-10-CM | POA: Diagnosis present

## 2019-05-18 DIAGNOSIS — L8990 Pressure ulcer of unspecified site, unspecified stage: Secondary | ICD-10-CM | POA: Insufficient documentation

## 2019-05-18 DIAGNOSIS — R278 Other lack of coordination: Secondary | ICD-10-CM | POA: Diagnosis not present

## 2019-05-18 DIAGNOSIS — I739 Peripheral vascular disease, unspecified: Secondary | ICD-10-CM | POA: Diagnosis not present

## 2019-05-18 DIAGNOSIS — R197 Diarrhea, unspecified: Secondary | ICD-10-CM | POA: Diagnosis not present

## 2019-05-18 DIAGNOSIS — D509 Iron deficiency anemia, unspecified: Secondary | ICD-10-CM | POA: Diagnosis present

## 2019-05-18 DIAGNOSIS — G9340 Encephalopathy, unspecified: Secondary | ICD-10-CM | POA: Diagnosis not present

## 2019-05-18 DIAGNOSIS — Z7189 Other specified counseling: Secondary | ICD-10-CM | POA: Diagnosis not present

## 2019-05-18 DIAGNOSIS — R2689 Other abnormalities of gait and mobility: Secondary | ICD-10-CM | POA: Diagnosis not present

## 2019-05-18 DIAGNOSIS — Z681 Body mass index (BMI) 19 or less, adult: Secondary | ICD-10-CM | POA: Diagnosis not present

## 2019-05-18 DIAGNOSIS — Z888 Allergy status to other drugs, medicaments and biological substances status: Secondary | ICD-10-CM

## 2019-05-18 DIAGNOSIS — Z7902 Long term (current) use of antithrombotics/antiplatelets: Secondary | ICD-10-CM

## 2019-05-18 DIAGNOSIS — I2510 Atherosclerotic heart disease of native coronary artery without angina pectoris: Secondary | ICD-10-CM | POA: Diagnosis not present

## 2019-05-18 DIAGNOSIS — Z951 Presence of aortocoronary bypass graft: Secondary | ICD-10-CM

## 2019-05-18 DIAGNOSIS — R079 Chest pain, unspecified: Secondary | ICD-10-CM | POA: Diagnosis not present

## 2019-05-18 DIAGNOSIS — R41841 Cognitive communication deficit: Secondary | ICD-10-CM | POA: Diagnosis not present

## 2019-05-18 DIAGNOSIS — E876 Hypokalemia: Secondary | ICD-10-CM | POA: Diagnosis present

## 2019-05-18 DIAGNOSIS — Z7982 Long term (current) use of aspirin: Secondary | ICD-10-CM

## 2019-05-18 DIAGNOSIS — J432 Centrilobular emphysema: Secondary | ICD-10-CM | POA: Diagnosis not present

## 2019-05-18 DIAGNOSIS — I472 Ventricular tachycardia: Secondary | ICD-10-CM | POA: Diagnosis not present

## 2019-05-18 DIAGNOSIS — M545 Low back pain: Secondary | ICD-10-CM | POA: Diagnosis not present

## 2019-05-18 DIAGNOSIS — R2681 Unsteadiness on feet: Secondary | ICD-10-CM | POA: Diagnosis not present

## 2019-05-18 DIAGNOSIS — N170 Acute kidney failure with tubular necrosis: Secondary | ICD-10-CM | POA: Diagnosis not present

## 2019-05-18 DIAGNOSIS — Z85118 Personal history of other malignant neoplasm of bronchus and lung: Secondary | ICD-10-CM

## 2019-05-18 DIAGNOSIS — K5900 Constipation, unspecified: Secondary | ICD-10-CM | POA: Diagnosis not present

## 2019-05-18 DIAGNOSIS — D631 Anemia in chronic kidney disease: Secondary | ICD-10-CM | POA: Diagnosis present

## 2019-05-18 DIAGNOSIS — I361 Nonrheumatic tricuspid (valve) insufficiency: Secondary | ICD-10-CM

## 2019-05-18 LAB — POCT I-STAT 7, (LYTES, BLD GAS, ICA,H+H)
Acid-base deficit: 10 mmol/L — ABNORMAL HIGH (ref 0.0–2.0)
Acid-base deficit: 3 mmol/L — ABNORMAL HIGH (ref 0.0–2.0)
Bicarbonate: 16.2 mmol/L — ABNORMAL LOW (ref 20.0–28.0)
Bicarbonate: 22.4 mmol/L (ref 20.0–28.0)
Calcium, Ion: 1.12 mmol/L — ABNORMAL LOW (ref 1.15–1.40)
Calcium, Ion: 1.1 mmol/L — ABNORMAL LOW (ref 1.15–1.40)
HCT: 44 % (ref 36.0–46.0)
HCT: 42 % (ref 36.0–46.0)
Hemoglobin: 15 g/dL (ref 12.0–15.0)
Hemoglobin: 14.3 g/dL (ref 12.0–15.0)
O2 Saturation: 100 %
O2 Saturation: 99 %
Patient temperature: 36.4
Patient temperature: 35.8
Potassium: 3.6 mmol/L (ref 3.5–5.1)
Potassium: 2.9 mmol/L — ABNORMAL LOW (ref 3.5–5.1)
Sodium: 136 mmol/L (ref 135–145)
Sodium: 139 mmol/L (ref 135–145)
TCO2: 17 mmol/L — ABNORMAL LOW (ref 22–32)
TCO2: 24 mmol/L (ref 22–32)
pCO2 arterial: 35.9 mmHg (ref 32.0–48.0)
pCO2 arterial: 37 mmHg (ref 32.0–48.0)
pH, Arterial: 7.259 — ABNORMAL LOW (ref 7.350–7.450)
pH, Arterial: 7.386 (ref 7.350–7.450)
pO2, Arterial: 213 mmHg — ABNORMAL HIGH (ref 83.0–108.0)
pO2, Arterial: 149 mmHg — ABNORMAL HIGH (ref 83.0–108.0)

## 2019-05-18 LAB — CBC
HCT: 43 % (ref 36.0–46.0)
HCT: 38.1 % (ref 36.0–46.0)
Hemoglobin: 13.6 g/dL (ref 12.0–15.0)
Hemoglobin: 12.7 g/dL (ref 12.0–15.0)
MCH: 25.4 pg — ABNORMAL LOW (ref 26.0–34.0)
MCH: 25.9 pg — ABNORMAL LOW (ref 26.0–34.0)
MCHC: 31.6 g/dL (ref 30.0–36.0)
MCHC: 33.3 g/dL (ref 30.0–36.0)
MCV: 80.4 fL (ref 80.0–100.0)
MCV: 77.8 fL — ABNORMAL LOW (ref 80.0–100.0)
Platelets: 90 10*3/uL — ABNORMAL LOW (ref 150–400)
Platelets: 74 10*3/uL — ABNORMAL LOW (ref 150–400)
RBC: 5.35 MIL/uL — ABNORMAL HIGH (ref 3.87–5.11)
RBC: 4.9 MIL/uL (ref 3.87–5.11)
RDW: 21.5 % — ABNORMAL HIGH (ref 11.5–15.5)
RDW: 21.2 % — ABNORMAL HIGH (ref 11.5–15.5)
WBC: 9.4 10*3/uL (ref 4.0–10.5)
WBC: 9.3 10*3/uL (ref 4.0–10.5)
nRBC: 1.6 % — ABNORMAL HIGH (ref 0.0–0.2)
nRBC: 1.9 % — ABNORMAL HIGH (ref 0.0–0.2)

## 2019-05-18 LAB — URINALYSIS, ROUTINE W REFLEX MICROSCOPIC
Bacteria, UA: NONE SEEN
Glucose, UA: NEGATIVE mg/dL
Hgb urine dipstick: NEGATIVE
Ketones, ur: NEGATIVE mg/dL
Leukocytes,Ua: NEGATIVE
Nitrite: NEGATIVE
Protein, ur: 30 mg/dL — AB
Specific Gravity, Urine: 1.017 (ref 1.005–1.030)
pH: 5 (ref 5.0–8.0)

## 2019-05-18 LAB — BASIC METABOLIC PANEL
Anion gap: 19 — ABNORMAL HIGH (ref 5–15)
BUN: 31 mg/dL — ABNORMAL HIGH (ref 8–23)
CO2: 17 mmol/L — ABNORMAL LOW (ref 22–32)
Calcium: 9.6 mg/dL (ref 8.9–10.3)
Chloride: 99 mmol/L (ref 98–111)
Creatinine, Ser: 2.33 mg/dL — ABNORMAL HIGH (ref 0.44–1.00)
GFR calc Af Amer: 24 mL/min — ABNORMAL LOW (ref >60)
GFR calc non Af Amer: 21 mL/min — ABNORMAL LOW (ref >60)
Glucose, Bld: 114 mg/dL — ABNORMAL HIGH (ref 70–99)
Potassium: 3.7 mmol/L (ref 3.5–5.1)
Sodium: 135 mmol/L (ref 135–145)

## 2019-05-18 LAB — CREATININE, SERUM
Creatinine, Ser: 2.23 mg/dL — ABNORMAL HIGH (ref 0.44–1.00)
GFR calc Af Amer: 26 mL/min — ABNORMAL LOW (ref >60)
GFR calc non Af Amer: 22 mL/min — ABNORMAL LOW (ref >60)

## 2019-05-18 LAB — ETHANOL: Alcohol, Ethyl (B): <10 mg/dL (ref <10)

## 2019-05-18 LAB — COOXEMETRY PANEL
Carboxyhemoglobin: 0.8 % (ref 0.5–1.5)
Carboxyhemoglobin: 1.3 % (ref 0.5–1.5)
Methemoglobin: 1.1 % (ref 0.0–1.5)
Methemoglobin: 1.1 % (ref 0.0–1.5)
O2 Saturation: 39.6 %
O2 Saturation: 72.6 %
Total hemoglobin: 12.6 g/dL (ref 12.0–16.0)
Total hemoglobin: 12.7 g/dL (ref 12.0–16.0)

## 2019-05-18 LAB — HIV ANTIBODY (ROUTINE TESTING W REFLEX): HIV Screen 4th Generation wRfx: NONREACTIVE

## 2019-05-18 LAB — RAPID URINE DRUG SCREEN, HOSP PERFORMED
Amphetamines: NOT DETECTED
Barbiturates: NOT DETECTED
Benzodiazepines: NOT DETECTED
Cocaine: NOT DETECTED
Opiates: NOT DETECTED
Tetrahydrocannabinol: NOT DETECTED

## 2019-05-18 LAB — BRAIN NATRIURETIC PEPTIDE: B Natriuretic Peptide: >4500 pg/mL — ABNORMAL HIGH (ref 0.0–100.0)

## 2019-05-18 LAB — RESPIRATORY PANEL BY RT PCR (FLU A&B, COVID)
Influenza A by PCR: NEGATIVE
Influenza B by PCR: NEGATIVE
SARS Coronavirus 2 by RT PCR: NEGATIVE

## 2019-05-18 LAB — ECHOCARDIOGRAM COMPLETE

## 2019-05-18 LAB — AMMONIA: Ammonia: 18 umol/L (ref 9–35)

## 2019-05-18 LAB — TROPONIN I (HIGH SENSITIVITY)
Troponin I (High Sensitivity): 407 ng/L (ref <18)
Troponin I (High Sensitivity): 387 ng/L (ref <18)
Troponin I (High Sensitivity): 492 ng/L (ref <18)

## 2019-05-18 LAB — SODIUM, URINE, RANDOM: Sodium, Ur: <10 mmol/L

## 2019-05-18 LAB — POC SARS CORONAVIRUS 2 AG -  ED: SARS Coronavirus 2 Ag: NEGATIVE

## 2019-05-18 LAB — HEMOGLOBIN A1C
Hgb A1c MFr Bld: 6.2 % — ABNORMAL HIGH (ref 4.8–5.6)
Mean Plasma Glucose: 131.24 mg/dL

## 2019-05-18 LAB — T4, FREE: Free T4: 1.05 ng/dL (ref 0.61–1.12)

## 2019-05-18 LAB — OSMOLALITY, URINE: Osmolality, Ur: 464 mOsm/kg (ref 300–900)

## 2019-05-18 LAB — LACTIC ACID, PLASMA
Lactic Acid, Venous: 6.7 mmol/L (ref 0.5–1.9)
Lactic Acid, Venous: 5.4 mmol/L (ref 0.5–1.9)

## 2019-05-18 LAB — TSH
TSH: 2.149 u[IU]/mL (ref 0.350–4.500)
TSH: 1.79 u[IU]/mL (ref 0.350–4.500)

## 2019-05-18 LAB — MAGNESIUM: Magnesium: 1.8 mg/dL (ref 1.7–2.4)

## 2019-05-18 MED ORDER — SODIUM CHLORIDE 0.9% FLUSH: 3.0000 mL | Freq: Once | INTRAVENOUS | Status: DC

## 2019-05-18 MED ORDER — SODIUM BICARBONATE 8.4 % IV SOLN
INTRAVENOUS | Status: AC
  Administered 2019-05-18: 21:00:00 100 meq via INTRAVENOUS
  Filled 2019-05-18: qty 100

## 2019-05-18 MED ORDER — FLUTICASONE PROPIONATE HFA 44 MCG/ACT IN AERO
1.0000 | INHALATION_SPRAY | Freq: Every day | RESPIRATORY_TRACT | Status: DC
  Administered 2019-05-20 – 2019-06-03 (×13): 1 via RESPIRATORY_TRACT
  Filled 2019-05-18: qty 10.6

## 2019-05-18 MED ORDER — SODIUM BICARBONATE-DEXTROSE 150-5 MEQ/L-% IV SOLN
150.0000 meq | INTRAVENOUS | Status: DC
  Administered 2019-05-18: 23:00:00 150 meq via INTRAVENOUS
  Filled 2019-05-18 (×3): qty 1000

## 2019-05-18 MED ORDER — SODIUM BICARBONATE 8.4 % IV SOLN: 100.0000 meq | Freq: Once | INTRAVENOUS | Status: AC

## 2019-05-18 MED ORDER — ACETAMINOPHEN 325 MG PO TABS: 650.0000 mg | ORAL_TABLET | Freq: Four times a day (QID) | ORAL | Status: DC | PRN

## 2019-05-18 MED ORDER — MIDAZOLAM HCL 2 MG/2ML IJ SOLN
INTRAMUSCULAR | Status: AC
  Administered 2019-05-18: 21:00:00 2 mg
  Filled 2019-05-18: qty 2

## 2019-05-18 MED ORDER — ENOXAPARIN SODIUM 30 MG/0.3ML ~~LOC~~ SOLN
30.0000 mg | SUBCUTANEOUS | Status: DC
  Administered 2019-05-18 – 2019-05-31 (×14): 30 mg via SUBCUTANEOUS
  Filled 2019-05-18 (×14): qty 0.3

## 2019-05-18 MED ORDER — LIDOCAINE HCL (PF) 1 % IJ SOLN
INTRAMUSCULAR | Status: AC
  Filled 2019-05-18: qty 30

## 2019-05-18 MED ORDER — SODIUM CHLORIDE 0.9% FLUSH
3.0000 mL | Freq: Two times a day (BID) | INTRAVENOUS | Status: DC
  Administered 2019-05-18 – 2019-05-21 (×4): 3 mL via INTRAVENOUS

## 2019-05-18 MED ORDER — POTASSIUM CHLORIDE CRYS ER 20 MEQ PO TBCR
40.0000 meq | EXTENDED_RELEASE_TABLET | ORAL | Status: AC
  Administered 2019-05-18 – 2019-05-19 (×2): 40 meq via ORAL
  Filled 2019-05-18 (×2): qty 2

## 2019-05-18 MED ORDER — FENTANYL CITRATE (PF) 100 MCG/2ML IJ SOLN
INTRAMUSCULAR | Status: AC
  Administered 2019-05-18: 21:00:00 50 ug
  Filled 2019-05-18: qty 2

## 2019-05-18 MED ORDER — EZETIMIBE 10 MG PO TABS
10.0000 mg | ORAL_TABLET | Freq: Every day | ORAL | Status: DC
  Administered 2019-05-18 – 2019-05-29 (×12): 10 mg via ORAL
  Filled 2019-05-18 (×12): qty 1

## 2019-05-18 MED ORDER — POLYETHYLENE GLYCOL 3350 17 G PO PACK
17.0000 g | PACK | Freq: Every day | ORAL | Status: DC | PRN
  Administered 2019-05-26: 11:00:00 17 g via ORAL
  Filled 2019-05-18: qty 1

## 2019-05-18 MED ORDER — SODIUM CHLORIDE 0.9% FLUSH: 3.0000 mL | INTRAVENOUS | Status: DC | PRN

## 2019-05-18 MED ORDER — ASPIRIN EC 81 MG PO TBEC
81.0000 mg | DELAYED_RELEASE_TABLET | Freq: Every day | ORAL | Status: DC
  Administered 2019-05-18 – 2019-05-19 (×2): 81 mg via ORAL
  Filled 2019-05-18 (×2): qty 1

## 2019-05-18 MED ORDER — ONDANSETRON HCL 4 MG/2ML IJ SOLN: 4.0000 mg | Freq: Four times a day (QID) | INTRAMUSCULAR | Status: DC | PRN

## 2019-05-18 MED ORDER — METOPROLOL SUCCINATE ER 25 MG PO TB24
25.0000 mg | ORAL_TABLET | Freq: Every day | ORAL | Status: DC
  Administered 2019-05-18: 25 mg via ORAL
  Filled 2019-05-18: qty 1

## 2019-05-18 MED ORDER — MILRINONE LACTATE IN DEXTROSE 20-5 MG/100ML-% IV SOLN
0.3750 ug/kg/min | INTRAVENOUS | Status: DC
  Administered 2019-05-18 (×2): 0.375 ug/kg/min via INTRAVENOUS
  Administered 2019-05-19 – 2019-05-20 (×4): 0.5 ug/kg/min via INTRAVENOUS
  Administered 2019-05-21: 18:00:00 0.375 ug/kg/min via INTRAVENOUS
  Administered 2019-05-21: 06:00:00 0.5 ug/kg/min via INTRAVENOUS
  Filled 2019-05-18 (×5): qty 100

## 2019-05-18 MED ORDER — ZOLPIDEM TARTRATE 5 MG PO TABS: 5.0000 mg | ORAL_TABLET | Freq: Every evening | ORAL | Status: DC | PRN

## 2019-05-18 MED ORDER — ALBUTEROL SULFATE HFA 108 (90 BASE) MCG/ACT IN AERS
2.0000 | INHALATION_SPRAY | Freq: Four times a day (QID) | RESPIRATORY_TRACT | Status: DC | PRN
  Administered 2019-05-20: 09:00:00 2 via RESPIRATORY_TRACT
  Filled 2019-05-18: qty 6.7

## 2019-05-18 MED ORDER — CYCLOBENZAPRINE HCL 10 MG PO TABS
10.0000 mg | ORAL_TABLET | Freq: Three times a day (TID) | ORAL | Status: DC | PRN
  Administered 2019-05-26 – 2019-05-27 (×3): 10 mg via ORAL
  Filled 2019-05-18 (×3): qty 1

## 2019-05-18 MED ORDER — ALPRAZOLAM 0.25 MG PO TABS: 0.2500 mg | ORAL_TABLET | Freq: Two times a day (BID) | ORAL | Status: DC | PRN

## 2019-05-18 MED ORDER — GABAPENTIN 100 MG PO CAPS
200.0000 mg | ORAL_CAPSULE | Freq: Three times a day (TID) | ORAL | Status: DC
  Administered 2019-05-18 – 2019-05-31 (×38): 200 mg via ORAL
  Filled 2019-05-18 (×39): qty 2

## 2019-05-18 MED ORDER — SODIUM CHLORIDE 0.9 % IV BOLUS
500.0000 mL | Freq: Once | INTRAVENOUS | Status: AC
  Administered 2019-05-18: 16:00:00 500 mL via INTRAVENOUS

## 2019-05-18 MED ORDER — ACETAMINOPHEN 650 MG RE SUPP: 650.0000 mg | Freq: Four times a day (QID) | RECTAL | Status: DC | PRN

## 2019-05-18 MED ORDER — HEPARIN BOLUS VIA INFUSION
4000.0000 [IU] | Freq: Once | INTRAVENOUS | Status: DC
  Filled 2019-05-18: qty 4000

## 2019-05-18 MED ORDER — ASPIRIN EC 81 MG PO TBEC: 81.0000 mg | DELAYED_RELEASE_TABLET | Freq: Every day | ORAL | Status: DC

## 2019-05-18 MED ORDER — ATORVASTATIN CALCIUM 80 MG PO TABS
80.0000 mg | ORAL_TABLET | Freq: Every day | ORAL | Status: DC
  Administered 2019-05-19 – 2019-05-29 (×11): 80 mg via ORAL
  Filled 2019-05-18 (×11): qty 1

## 2019-05-18 MED ORDER — NOREPINEPHRINE 4 MG/250ML-% IV SOLN
0.0000 ug/min | INTRAVENOUS | Status: DC
  Filled 2019-05-18: qty 250

## 2019-05-18 MED ORDER — HEPARIN (PORCINE) 25000 UT/250ML-% IV SOLN: 800.0000 [IU]/h | INTRAVENOUS | Status: DC

## 2019-05-18 MED ORDER — CLOPIDOGREL BISULFATE 75 MG PO TABS
75.0000 mg | ORAL_TABLET | Freq: Every day | ORAL | Status: DC
  Administered 2019-05-18 – 2019-06-03 (×17): 75 mg via ORAL
  Filled 2019-05-18 (×17): qty 1

## 2019-05-18 MED ORDER — ACETAMINOPHEN 325 MG PO TABS
650.0000 mg | ORAL_TABLET | ORAL | Status: DC | PRN
  Administered 2019-05-26 – 2019-05-28 (×4): 650 mg via ORAL
  Filled 2019-05-18 (×4): qty 2

## 2019-05-18 MED ORDER — SODIUM CHLORIDE 0.9 % IV SOLN
250.0000 mL | INTRAVENOUS | Status: DC | PRN
  Administered 2019-05-18: 21:00:00 250 mL via INTRAVENOUS
  Administered 2019-05-19: 17:00:00 1000 mL via INTRAVENOUS

## 2019-05-18 NOTE — Progress Notes (Signed)
  Swan placed and radial arterial line placed (see procedure notes)  Swan numbers done personally with nursing staff  RA 16 PA 47/35 (40) PCWP 26 Thermo CO/CI 1.49/0.84 SVR 4132 PVR 8.4 WU CPO 0.30 PAPI 0.75 Co-ox 39%  ABG 7.25/36/213/100%   Patient hypothermic with body temp 94 degrees. All c/w with profound cardiogenic shock.   Have started milrinone and bicarb gtt. Warming blankets placed. Thyroid panels sent.   Recheck ABG, Co-ox and lactate in 1 hour and adjust.   I updated her son and brought him to bedside personally.   Additional CCT 45 minutes not including procedural time.   Glori Bickers, MD  9:59 PM

## 2019-05-18 NOTE — ED Notes (Signed)
Daughter- Nicolette Bang- [phone] like pt updates.

## 2019-05-18 NOTE — Progress Notes (Signed)
Bilateral lower extremity venous duplex complete.  Preliminary results given to [place] @ 18:00. Please see CV Proc tab for preliminary results. Lita Mains- RDMS, RVT 5:58 PM  05/18/2019

## 2019-05-18 NOTE — ED Notes (Signed)
Patient transported to CT 

## 2019-05-18 NOTE — ED Notes (Signed)
Pt in CT will draw labs when pt returns.

## 2019-05-18 NOTE — ED Notes (Signed)
Pt on assessment, unable to answer questions, falls asleep during conversationunable to follow commands.  Slurred speech with cool extremities.

## 2019-05-18 NOTE — H&P (Signed)
[facility] Admission History and Physical Service Pager: [phone]  Patient name: Leah [name] [facility] Medical record number: [id number] Date of birth: 1951-03-24 Age: 69 y.o. Gender: female  Primary Care Provider: Guadalupe Dawn, MD Consultants: Cardiology Code Status: Full Code Preferred Emergency Contact: [facility] [address], need to find contact number  Chief Complaint: Altered Mental Status  Assessment and Plan: Leah [name] is a 69 y.o. female presenting with altered mental status . PMH is significant for CAD, HTN, HLD, PVD, HFpEF, CKD, MV replacement, Hx bronchogenic lung cancer, and tobacco use   Altered Mental Status Acute, improving. Patient presents with last known normal yesterday. Her [name] contacted the ED after finding her today still sitting in her chair from yesterday. Patient did have chest pain at that time but no longer has chest pain now. She is on 4L of O2 [place] without need for oxygen at home, cold extremities. Etiology remains broad but leading differential includes unstable aortic aneursym/aortic dissection, NSTEMI, worsening heart failure with acute exacerbation, PE/DVT, stroke, intoxication, alcohol/substance use, and withdrawal. Unlikely to be infectious as she is afebrile with no WBC, lactic acid is elevated but likely from poor perfusion. No evidence of infectious source. Unlikely to be withdrawal as patient mental status is improving as she can now tell me who she is, where she is, and correctly knows the year. Patient was not able to do this before. However, she is still unclear about what happened. She only remembers she had chest pain and fatigue yesterday and sat down in her chair and woke up in the hospital. CT head non-con was negative for any acute abnormality and she has no visible gross neurological deficit but full neuro exam difficult to complete as she is not able to fully participate. U/S bilateral of lower extremities  were negative for DVT. Could not give contrast with chest CT due to AKI but PE still a possibility. Could consider a V/Q scan. Chest CT showed stable aortic anuersym and vitals are stable with good MAPs so unlikely to be aortic dissection. NSTEMI leading to acute heart failure exacerbation is leading diagnosis at this point. EKG shows sinus tachycardia with right atrial enlargement and left axis deviation. Twave abnormalities and lateral ischemia changes that are new and Troponin's are elevated at 407 > 387. BNP also signifcantly elevated above baseline at >4,500; last BNP was 2,704.4. She does not appear fluid overloaded at this time. CXR showed no pulmonary edema. She was not able to receive contrast due to an AKI. No history of substance abuse so intoxication and withdrawal less likely. Ethanol level was <10. Ammonia level normal. UDS pending. TSH normal. COVID-19 negative.  - Admit to Step down, attending Dr. McDiarmid - Cardiac telemetry continuous - Vitals per floor with continuous pulse ox - Cont supplmental O2 - F/u UDS - Consider brain MRI if neurological changes are seen - Cardiology consulted; recommended Heparin gtt and STAT Echo, appreciate their recs - Trending troponin - Repeat EKG in am - Checking Magnesium - am CBC and CMP - Heparin gtt per pharm with close monitoring due to platlets 90k  AKI on CKD Acute on chronic. Baseline appears to be 1.0-1.3. Elevated to 2.33 on admission. Most likely from cardiopulmonary syndrome and intravascular depletion. - Urine osmolality, sodium, and creatnine; calculate FENa - F/u on echo and give fluids if able - Avoiding nephrotoxic agents  CAD w/hx of open heart surgery Home medications include Bildil, Lasix, Nitrostat, Metoprolol, ASA, Plavix. - Holding home Bidil  and Lasix due to concern for possible overall  - Cont Metop - Cont ASA and Plavix - On Heparin gtt  HLD On home Zetia and Lipitor - Cont home medications    FEN/GI: NPO  except sips with meds Prophylaxis: Heparin gtt  Disposition: Step down, Cardiac Tele  History of Present Illness:  Leah [name] is a 69 y.o. female presenting with altered mental status last known normal was yesterday. Her son found her today largely unresponsive and lethargic slumped in her recliner. It was where she was last seen normal the night before. She was taken to ED and she has difficulty relating what happened. She does not remember much expect she sat down in her chair last night due to being tired and having chest pain. She states then she just remembers being in the hospital. She appears frail and weak. She is unable to give any additional history. She did give me permission to contact her [name] to get more information. During my exam she is aware of who she is, where she is, and what year it is.  Review Of Systems: Per HPI with the following additions:   Review of Systems  Constitutional: Negative for chills and fever.  Respiratory: Negative for cough, shortness of breath and wheezing.   Cardiovascular: Negative for chest pain, palpitations, orthopnea and leg swelling.  Gastrointestinal: Negative for abdominal pain, diarrhea, nausea and vomiting.  Genitourinary: Negative for dysuria, frequency and urgency.  Musculoskeletal: Negative for myalgias.  Neurological: Negative for dizziness, sensory change, speech change, focal weakness, seizures, loss of consciousness, weakness and headaches.    Patient Active Problem List   Diagnosis Date Noted  . Shortness of breath 04/15/2019  . Thoracic aortic aneurysm without rupture ([place]) 09/03/2018  . Viral upper respiratory illness 03/16/2018  . Hypokalemia 02/12/2018  . Claudication ([place]) 11/21/2017  . Seasonal allergies 07/28/2017  . Bilateral sciatica 07/02/2017  . Bilateral hearing loss due to cerumen impaction 07/02/2017  . CHF (congestive heart failure) ([place]) 06/05/2017  . Bronchogenic lung cancer, right ([place])  11/29/2016  . Dysphagia 08/26/2016  . Health care maintenance 04/17/2016  . Dizziness 04/17/2016  . Decreased visual acuity 01/17/2016  . Depressed mood 07/27/2015  . S/P MVR (mitral valve replacement) 12/05/2014  . Moderate aortic regurgitation 10/26/2014  . Renal vascular disease 10/26/2014  . Acute on chronic diastolic congestive heart failure ([place]) 10/21/2014  . Tobacco use 10/21/2014  . Diastolic dysfunction, grade 2 by echo June 2016 10/21/2014  . Carpal tunnel syndrome 10/17/2014  . Severe mitral regurgitation 08/25/2014  . Unstable angina ([place]) 08/22/2014  . Low back pain 07/05/2012  . PVC (premature ventricular contraction) 10/02/2011  . History of angioedema with ACE 2013 06/14/2011  . PVD- s/p multiple proceedures 06/04/2011  . Hyperlipidemia 09/28/2008  . Essential hypertension, benign 09/28/2008  . CAD S/P LAD DES 2009 with 70% ISR 08/24/14 09/28/2008    Past Medical History: Past Medical History:  Diagnosis Date  . Anemia   . Bronchogenic lung cancer, right ([place]) 11/29/2016  . CAD (coronary artery disease)    a. s/p LIMA-LAD in 11/2014  . Carotid artery occlusion   . CHF (congestive heart failure) ([place])   . Complication of anesthesia    slow to awaken x 1 maybe 2001  . COPD (chronic obstructive pulmonary disease) ([place])   . GERD (gastroesophageal reflux disease)    "sometimes" takes Copywriter, advertising or drinks gingerale   . Headache(784.0)   . History of kidney stones   .  Hyperlipidemia   . Hypertension   . Leg pain   . Peripheral vascular disease ([place])   . Renal vascular disease 10/26/2014   bilateral stents placed   . Severe mitral regurgitation    a. s/p MVR in 11/2014 with a pericardial tissue valve  . Shortness of breath dyspnea   . Tobacco abuse     Past Surgical History: Past Surgical History:  Procedure Laterality Date  . ABDOMINAL AORTOGRAM W/LOWER EXTREMITY Left 09/16/2018   Procedure: ABDOMINAL AORTOGRAM W/LOWER EXTREMITY;  Surgeon: Marty Heck, MD;  Location: [place] [facility];  Service: Cardiovascular;  Laterality: Left;  . ABDOMINAL HYSTERECTOMY    . ANGIOPLASTY / STENTING ILIAC  2010   right external iliac by Dr. Irish Lack  . CARDIAC CATHETERIZATION  11/13/11   Left Heart Cath. with Coronary Angiogram  . CARDIAC CATHETERIZATION N/A 08/24/2014   Procedure: Left Heart Cath and Coronary Angiography;  Surgeon: Wellington Hampshire, MD;  Location: [place] [facility];  Service: Cardiovascular;  Laterality: N/A;  . CARDIAC CATHETERIZATION N/A 10/26/2014   Procedure: Right/Left Heart Cath and Coronary Angiography;  Surgeon: Peter M Martinique, MD; oLAD 70%, mLAD 70% ISR, D2 30%, OFC 30%, RCA 20%, EF nl, low R heart pressures after diuresis, severe MR  . CAROTID ENDARTERECTOMY  11/21/2007   left  . COLONOSCOPY W/ POLYPECTOMY    . CORONARY ANGIOPLASTY WITH STENT PLACEMENT  6/09   LAD 2.5x12 [place]  . CORONARY ARTERY BYPASS GRAFT N/A 12/05/2014   Procedure: CORONARY ARTERY BYPASS GRAFTING (CABG);  Surgeon: [name], MD;  Location: [place];  Service: Open Heart Surgery;  Laterality: N/A;  Times 1 using left internal mammary artery to LAD  . FEMORAL-POPLITEAL BYPASS GRAFT  10/12   left Dr. Kellie Simmering  . LOWER EXTREMITY ANGIOGRAPHY N/A 02/04/2018   Procedure: LOWER EXTREMITY ANGIOGRAPHY;  Surgeon: Marty Heck, MD;  Location: [place] [facility];  Service: Cardiovascular;  Laterality: N/A;  . MITRAL VALVE REPAIR  2016  . MITRAL VALVE REPLACEMENT N/A 12/05/2014   Procedure: MITRAL VALVE (MV) REPLACEMENT;  Surgeon: [name], MD;  Location: [place];  Service: Open Heart Surgery;  Laterality: N/A;  Closure left atrial appendage  . PERIPHERAL VASCULAR BALLOON ANGIOPLASTY Bilateral 09/16/2018   Procedure: PERIPHERAL VASCULAR BALLOON ANGIOPLASTY;  Surgeon: Marty Heck, MD;  Location: [place] [facility];  Service: Cardiovascular;  Laterality: Bilateral;  bilateral renal arteries, Left external iliac  . PERIPHERAL VASCULAR  INTERVENTION Left 02/04/2018   Procedure: PERIPHERAL VASCULAR INTERVENTION;  Surgeon: Marty Heck, MD;  Location: [place] [facility];  Service: Cardiovascular;  Laterality: Left;  external iliac  . RENAL ANGIOGRAPHY Bilateral 09/16/2018   Procedure: RENAL ANGIOGRAPHY;  Surgeon: Marty Heck, MD;  Location: [place] [facility];  Service: Cardiovascular;  Laterality: Bilateral;  . RENAL ARTERY STENT Bilateral   . TEE WITHOUT CARDIOVERSION N/A 10/24/2014   Procedure: TRANSESOPHAGEAL ECHOCARDIOGRAM (TEE);  Surgeon: Thayer Headings, MD;  Location: [place];  Service: Cardiovascular;  Laterality: N/A;  . TEE WITHOUT CARDIOVERSION N/A 12/05/2014   Procedure: TRANSESOPHAGEAL ECHOCARDIOGRAM (TEE);  Surgeon: [name], MD;  Location: [place];  Service: Open Heart Surgery;  Laterality: N/A;  . VIDEO ASSISTED THORACOSCOPY (VATS)/WEDGE RESECTION Right 11/29/2016   Procedure: VIDEO ASSISTED THORACOSCOPY (VATS)/ RUL WEDGE RESECTION OF LESION/ NODE SAMPLING;  Surgeon: [name], MD;  Location: [place];  Service: Thoracic;  Laterality: Right;  Marland Kitchen VIDEO BRONCHOSCOPY N/A 11/29/2016   Procedure: VIDEO BRONCHOSCOPY;  Surgeon: [name], MD;  Location[facility];  Service: Thoracic;  Laterality: N/A;    Social History: Social History   Tobacco Use  . Smoking status: Current Every Day Smoker    Packs/day: 0.50    Years: 35.00    Pack years: 17.50    Types: Cigarettes  . Smokeless tobacco: Never Used  Substance Use Topics  . Alcohol use: Yes    Alcohol/week: 0.0 standard drinks    Comment: occasion  . Drug use: No   Additional social history: none Please also refer to relevant sections of EMR.  Family History: Family History  Problem Relation Age of Onset  . Cancer Mother        BRAIN AND LUNG  . Hypertension Mother   . Hypertension Father   . Heart disease Father   . Prostate cancer Father   . Kidney disease Father   . Hypertension Brother   . ALS Brother   .  Stroke Paternal Aunt        great aunt  . Heart attack Neg Hx   . Colon cancer Neg Hx   . Stomach cancer Neg Hx   . Rectal cancer Neg Hx   . Esophageal cancer Neg Hx   . Liver cancer Neg Hx     Allergies and Medications: Allergies  Allergen Reactions  . Lisinopril Swelling    Angioedema 06/10/11  . Chantix [Varenicline] Other (See Comments)    Caused insomnia  . Penicillins Hives    Did it involve swelling of the face/tongue/throat, SOB, or low BP? No Did it involve sudden or severe rash/hives, skin peeling, or any reaction on the inside of your mouth or nose? No Did you need to seek medical attention at a hospital or doctor's office? No When did it last happen?50 Years If all above answers are "NO", may proceed with cephalosporin use.     No current facility-administered medications on file prior to encounter.   Current Outpatient Medications on File Prior to Encounter  Medication Sig Dispense Refill  . cyclobenzaprine (FLEXERIL) 10 MG tablet Take 1 tablet by mouth three times daily as needed for muscle spasm 180 tablet 0  . traMADol (ULTRAM) 50 MG tablet TAKE 1 TABLET BY MOUTH EVERY 6 HOURS AS NEEDED FOR PAIN 90 tablet 0  . albuterol (PROVENTIL HFA;VENTOLIN HFA) 108 (90 Base) MCG/ACT inhaler Inhale 2 puffs into the lungs every 6 (six) hours as needed for wheezing or shortness of breath. 1 Inhaler 0  . aspirin EC 81 MG tablet Take 1 tablet (81 mg total) by mouth daily. 90 tablet 1  . atorvastatin (LIPITOR) 80 MG tablet Take 1 tablet (80 mg total) by mouth daily. 30 tablet 6  . clopidogrel (PLAVIX) 75 MG tablet Take 1 tablet by mouth once daily 30 tablet 6  . ezetimibe (ZETIA) 10 MG tablet Take 1 tablet (10 mg total) by mouth daily. (Patient taking differently: Take 10 mg by mouth daily at 3 pm. ) 90 tablet 3  . fluticasone (FLOVENT HFA) 44 MCG/ACT inhaler Inhale 1 puff into the lungs daily. 2 Inhaler 0  . furosemide (LASIX) 40 MG tablet Take 0.5 tablets (20 mg total) by  mouth daily. (Patient taking differently: Take 40 mg by mouth every evening. ) 30 tablet 6  . gabapentin (NEURONTIN) 100 MG capsule TAKE 2 CAPSULES BY MOUTH THREE TIMES DAILY 120 capsule 0  . isosorbide-hydrALAZINE (BIDIL) 20-37.5 MG tablet Take 1 tablet by mouth 3 (three) times daily. 90 tablet  11  . metoprolol succinate (TOPROL-XL) 25 MG 24 hr tablet Take 1 tablet (25 mg total) by mouth daily. 90 tablet 3  . nitroGLYCERIN (NITROSTAT) 0.4 MG SL tablet Place 1 tablet (0.4 mg total) under the tongue every 5 (five) minutes x 3 doses as needed for chest pain. 25 tablet 12  . potassium chloride SA (KLOR-CON) 20 MEQ tablet Take 2 tablets ( 40 meq ) 4 times a day. 90 tablet 3  . traZODone (DESYREL) 100 MG tablet TAKE 1 TABLET BY MOUTH AT BEDTIME 90 tablet 0    Objective: BP 131/89   Pulse (!) 116   Temp 97.6 F (36.4 C) (Oral)   Resp 19   SpO2 100%  Exam: Gen: Alert and Oriented x 3, NAD CV: RRR, systolic murmur heard best of LSB Resp: CTAB, no wheezing, rales, or rhonchi, comfortable work of breathing Abd: non-distended, non-tender, soft, +bs in all four quadrants MSK: Moves all four extremities Ext: no clubbing, cyanosis, or edema Neuro: No gross deficits, 3/5 strength bilaterally UE and LE, unable to participate fully as she has difficulty following commands Skin: warm, very dry, intact, no rashes  Labs and Imaging: CBC BMET  Recent Labs  Lab 05/18/19 1343  WBC 9.4  HGB 13.6  HCT 43.0  PLT 90*   Recent Labs  Lab 05/18/19 1343  NA 135  K 3.7  CL 99  CO2 17*  BUN 31*  CREATININE 2.33*  GLUCOSE 114*  CALCIUM 9.6     EKG: LAD, RA enlargement, lateral ischemia and t-wave abnormalities DG Chest: Cardiac enlargement with mitral valve replacement. Negative for heart failure. Postsurgical changes the right upper lobe for lobectomy. No acute infiltrate or mass. CT Head: no acute intracranial abnormalities CT Chest: No significant change in the focal ectasia of the intrathoracic  descending aorta measuring up to 4 cm. This is unchanged dating back to 2019. CT Abd&Pelvis: 3. Centrilobular emphysematous changes and postsurgical changes of a partial right upper lobectomy. Diverticulosis without diverticulitis. LE Vascular U/S: No evidence of DVT Echo: Pending  Nuala Alpha, DO 05/18/2019, 6:15 PM PGY-3, [place] Intern pager: [phone], text pages welcome

## 2019-05-18 NOTE — CV Procedure (Signed)
Radial arterial line placement.    Indication: Cardiogenic shock.  Emergent consent assumed. The left wrist was prepped and draped in the routine sterile fashion a single lumen radial arterial catheter was placed in the left radial artery using a modified Seldinger technique and u/s guidance. Good blood flow and wave forms. A dressing was placed.     Glori Bickers, MD  9:50 PM

## 2019-05-18 NOTE — CV Procedure (Signed)
Procedure: PA catheter placement  Indication: Cardiogenic shock   The risks and indication of the procedure were explained to her brother. Consent was signed and placed on the chart. An appropriate timeout was taken prior to the procedure.   The rightt neck was prepped and draped in the routine sterile fashion and anesthetized with 1% local lidocaine. A 7 FR venous sheath was placed in the right internal jugular vein using a modified Seldinger technique and u/s guidance. A standard Swan-Ganz catheter was used for the procedure. The distal tip of the PA cath was maneuvered into the right pulmonary artery using pressure waveform guidance and adjusted with serial CXRs.   The catheter was sutured in place.   No apparent complications.  CXR confirmed good placement. No PTX.  Glori Bickers, MD  9:51 PM

## 2019-05-18 NOTE — Consult Note (Addendum)
Advanced Heart Failure Team Consult Note   Primary Physician: Guadalupe Dawn, MD PCP-Cardiologist:  Peter Martinique, MD  Reason for Consultation: Cardiogenic shock   HPI:    Leah [name] is a 69 y.o. female with a hx of CAD (s/p LIMA to LAD in 11/2014), MVR 11/2014 with pericardial tissue valve, HTN, HLD, COPD, tobaco use, lung adenocarcinoma stage 1 s/p resection, and PVD (s/p L CEA and R external iliac stenting who is being seen today for cardiogenic shock at the request of Dr. Gardiner Rhyme.   Patient unable to provide meaningful history due to altered mental status. History gleaned from chart.   Ms. Luse is followed by Dr. Martinique for the above cardiac issues. Patient underwent CABG and MVR in 11/2014. In August 2018 she had pre-op eval with Myoview study shoing no ischemia and reduced EF. On CT it was noted she had a right upper lobe nodule which had increased in size and therefore she underwent resection in 11/2016 with pathology showing adenocarcinoma stage 1. Echo in October 2018 showed EF 40% with normal mitral valve prosthesis.  The patient presented to the ED 05/18/19 for AMS. Patient lives with her 2 sons. The sons noted that the patient was acting different for the last day or two. She was sitting all day and not responding as her usual self. Also noted some slurred speech. They called EMS when things did not get better.   Saw Dr. Martinique on 04/29/19 and reported that 2 weeks prior she became very ill. Had N/V and difficulty swallowing. Was SOB, states she was hallucinating and couldn't walk. Arms and legs were cold. She didn't know what was wrong but was  feeling much better. Had lost 5 lbs.   In the ED B/P 133/95, pulse 116, temp 97.6, RR 20, 100% O2. Labs showed potassium 3.7, creatinine 2.33 (1.1 in 2/20, 1.8 on 04/28/19), glucose 114, anion gap 19. WBC 9.4, Hgb 13.6. platelets 90. HS troponin 407. CXR with cardiac enlargement, no HF, no acute infiltrate. CT head ordered. Given  IVF bolus. EKG showed sinus tach with LVH and ST depression in V6. Cardiology was consulted for elevated troponin. She was seen by Dr. Gardiner Rhyme who was concerned about cardiogenic shock due to lethargy and cool extremities. Lactate 6.7.   Echo done stat in ER:  EF 10% with severe RV dysfunction. Moderate to severe AI  Situation d/w family and family desired full code and aggressive measures. COVID testing negative.    At baseline able to do ADLs but not very active.    Review of Systems: [y] = yes, [ ]  = no  - unable to provide due to AMS  Home Medications Prior to Admission medications   Medication Sig Start Date End Date Taking? Authorizing Provider  cyclobenzaprine (FLEXERIL) 10 MG tablet Take 1 tablet by mouth three times daily as needed for muscle spasm 04/30/19   Guadalupe Dawn, MD  traMADol (ULTRAM) 50 MG tablet TAKE 1 TABLET BY MOUTH EVERY 6 HOURS AS NEEDED FOR PAIN 04/30/19   Guadalupe Dawn, MD  albuterol (PROVENTIL HFA;VENTOLIN HFA) 108 (90 Base) MCG/ACT inhaler Inhale 2 puffs into the lungs every 6 (six) hours as needed for wheezing or shortness of breath. 02/20/16   Archie Patten, MD  aspirin EC 81 MG tablet Take 1 tablet (81 mg total) by mouth daily. 07/24/16   Archie Patten, MD  atorvastatin (LIPITOR) 80 MG tablet Take 1 tablet (80 mg total) by mouth daily. 05/01/18 04/01/19  Martinique, Peter M, MD  clopidogrel (PLAVIX) 75 MG tablet Take 1 tablet by mouth once daily 02/26/19   Marty Heck, MD  ezetimibe (ZETIA) 10 MG tablet Take 1 tablet (10 mg total) by mouth daily. Patient taking differently: Take 10 mg by mouth daily at 3 pm.  03/04/18 04/01/19  Martinique, Peter M, MD  fluticasone (FLOVENT HFA) 44 MCG/ACT inhaler Inhale 1 puff into the lungs daily. 04/12/19   Guadalupe Dawn, MD  furosemide (LASIX) 40 MG tablet Take 0.5 tablets (20 mg total) by mouth daily. Patient taking differently: Take 40 mg by mouth every evening.  06/29/18   Duke, Tami Lin, PA  gabapentin (NEURONTIN)  100 MG capsule TAKE 2 CAPSULES BY MOUTH THREE TIMES DAILY 02/28/19   Guadalupe Dawn, MD  isosorbide-hydrALAZINE (BIDIL) 20-37.5 MG tablet Take 1 tablet by mouth 3 (three) times daily. 03/10/18   Guadalupe Dawn, MD  metoprolol succinate (TOPROL-XL) 25 MG 24 hr tablet Take 1 tablet (25 mg total) by mouth daily. 06/24/18   Martinique, Peter M, MD  nitroGLYCERIN (NITROSTAT) 0.4 MG SL tablet Place 1 tablet (0.4 mg total) under the tongue every 5 (five) minutes x 3 doses as needed for chest pain. 01/29/19   Martinique, Peter M, MD  potassium chloride SA (KLOR-CON) 20 MEQ tablet Take 2 tablets ( 40 meq ) 4 times a day. 05/07/19   Martinique, Peter M, MD  traZODone (DESYREL) 100 MG tablet TAKE 1 TABLET BY MOUTH AT BEDTIME 02/23/19   Guadalupe Dawn, MD    Past Medical History: Past Medical History:  Diagnosis Date  . Anemia   . Bronchogenic lung cancer, right ([place]) 11/29/2016  . CAD (coronary artery disease)    a. s/p LIMA-LAD in 11/2014  . Carotid artery occlusion   . CHF (congestive heart failure) ([place])   . Complication of anesthesia    slow to awaken x 1 maybe 2001  . COPD (chronic obstructive pulmonary disease) ([place])   . GERD (gastroesophageal reflux disease)    "sometimes" takes Copywriter, advertising or drinks gingerale   . Headache(784.0)   . History of kidney stones   . Hyperlipidemia   . Hypertension   . Leg pain   . Peripheral vascular disease ([place])   . Renal vascular disease 10/26/2014   bilateral stents placed   . Severe mitral regurgitation    a. s/p MVR in 11/2014 with a pericardial tissue valve  . Shortness of breath dyspnea   . Tobacco abuse     Past Surgical History: Past Surgical History:  Procedure Laterality Date  . ABDOMINAL AORTOGRAM W/LOWER EXTREMITY Left 09/16/2018   Procedure: ABDOMINAL AORTOGRAM W/LOWER EXTREMITY;  Surgeon: Marty Heck, MD;  Location: [facility];  Service: Cardiovascular;  Laterality: Left;  . ABDOMINAL HYSTERECTOMY    . ANGIOPLASTY / STENTING ILIAC   2010   right external iliac by Dr. Irish Lack  . CARDIAC CATHETERIZATION  11/13/11   Left Heart Cath. with Coronary Angiogram  . CARDIAC CATHETERIZATION N/A 08/24/2014   Procedure: Left Heart Cath and Coronary Angiography;  Surgeon: Wellington Hampshire, MD;  Location: [place] [facility];  Service: Cardiovascular;  Laterality: N/A;  . CARDIAC CATHETERIZATION N/A 10/26/2014   Procedure: Right/Left Heart Cath and Coronary Angiography;  Surgeon: Peter M Martinique, MD; oLAD 70%, mLAD 70% ISR, D2 30%, OFC 30%, RCA 20%, EF nl, low R heart pressures after diuresis, severe MR  . CAROTID ENDARTERECTOMY  11/21/2007   left  . COLONOSCOPY W/ POLYPECTOMY    .  CORONARY ANGIOPLASTY WITH STENT PLACEMENT  6/09   LAD 2.5x12 Promus  . CORONARY ARTERY BYPASS GRAFT N/A 12/05/2014   Procedure: CORONARY ARTERY BYPASS GRAFTING (CABG);  Surgeon: [name], MD;  Location: [facility];  Service: Open Heart Surgery;  Laterality: N/A;  Times 1 using left internal mammary artery to LAD  . FEMORAL-POPLITEAL BYPASS GRAFT  10/12   left Dr. Kellie Simmering  . LOWER EXTREMITY ANGIOGRAPHY N/A 02/04/2018   Procedure: LOWER EXTREMITY ANGIOGRAPHY;  Surgeon: Marty Heck, MD;  Location: [place] [facility];  Service: Cardiovascular;  Laterality: N/A;  . MITRAL VALVE REPAIR  2016  . MITRAL VALVE REPLACEMENT N/A 12/05/2014   Procedure: MITRAL VALVE (MV) REPLACEMENT;  Surgeon: [name], MD;  Location: [place];  Service: Open Heart Surgery;  Laterality: N/A;  Closure left atrial appendage  . PERIPHERAL VASCULAR BALLOON ANGIOPLASTY Bilateral 09/16/2018   Procedure: PERIPHERAL VASCULAR BALLOON ANGIOPLASTY;  Surgeon: Marty Heck, MD;  Location: [facility];  Service: Cardiovascular;  Laterality: Bilateral;  bilateral renal arteries, Left external iliac  . PERIPHERAL VASCULAR INTERVENTION Left 02/04/2018   Procedure: PERIPHERAL VASCULAR INTERVENTION;  Surgeon: Marty Heck, MD;  Location: [place] [facility];  Service:  Cardiovascular;  Laterality: Left;  external iliac  . RENAL ANGIOGRAPHY Bilateral 09/16/2018   Procedure: RENAL ANGIOGRAPHY;  Surgeon: Marty Heck, MD;  Location: [place] [facility];  Service: Cardiovascular;  Laterality: Bilateral;  . RENAL ARTERY STENT Bilateral   . TEE WITHOUT CARDIOVERSION N/A 10/24/2014   Procedure: TRANSESOPHAGEAL ECHOCARDIOGRAM (TEE);  Surgeon: Thayer Headings, MD;  Location: [place];  Service: Cardiovascular;  Laterality: N/A;  . TEE WITHOUT CARDIOVERSION N/A 12/05/2014   Procedure: TRANSESOPHAGEAL ECHOCARDIOGRAM (TEE);  Surgeon: [name], MD;  Location: [place];  Service: Open Heart Surgery;  Laterality: N/A;  . VIDEO ASSISTED THORACOSCOPY (VATS)/WEDGE RESECTION Right 11/29/2016   Procedure: VIDEO ASSISTED THORACOSCOPY (VATS)/ RUL WEDGE RESECTION OF LESION/ NODE SAMPLING;  Surgeon: [name], MD;  Location: [place];  Service: Thoracic;  Laterality: Right;  Marland Kitchen VIDEO BRONCHOSCOPY N/A 11/29/2016   Procedure: VIDEO BRONCHOSCOPY;  Surgeon: [name], MD;  Location: [facility];  Service: Thoracic;  Laterality: N/A;    Family History: Family History  Problem Relation Age of Onset  . Cancer Mother        BRAIN AND LUNG  . Hypertension Mother   . Hypertension Father   . Heart disease Father   . Prostate cancer Father   . Kidney disease Father   . Hypertension Brother   . ALS Brother   . Stroke Paternal Aunt        great aunt  . Heart attack Neg Hx   . Colon cancer Neg Hx   . Stomach cancer Neg Hx   . Rectal cancer Neg Hx   . Esophageal cancer Neg Hx   . Liver cancer Neg Hx     Social History: Social History   Socioeconomic History  . Marital status: Divorced    Spouse name: Not on file  . Number of children: 4  . Years of education: Not on file  . Highest education level: Not on file  Occupational History  . Occupation: retired  Tobacco Use  . Smoking status: Current Every Day Smoker    Packs/day: 0.50    Years: 35.00    Pack  years: 17.50    Types: Cigarettes  . Smokeless tobacco: Never Used  Substance and Sexual Activity  .  Alcohol use: Yes    Alcohol/week: 0.0 standard drinks    Comment: occasion  . Drug use: No  . Sexual activity: Never  Other Topics Concern  . Not on file  Social History Narrative  . Not on file   Social Determinants of Health   Financial Resource Strain:   . Difficulty of Paying Living Expenses: Not on file  Food Insecurity:   . Worried About Charity fundraiser in the Last Year: Not on file  . Ran Out of Food in the Last Year: Not on file  Transportation Needs:   . Lack of Transportation (Medical): Not on file  . Lack of Transportation (Non-Medical): Not on file  Physical Activity:   . Days of Exercise per Week: Not on file  . Minutes of Exercise per Session: Not on file  Stress:   . Feeling of Stress : Not on file  Social Connections:   . Frequency of Communication with Friends and Family: Not on file  . Frequency of Social Gatherings with Friends and Family: Not on file  . Attends Religious Services: Not on file  . Active Member of Clubs or Organizations: Not on file  . Attends Archivist Meetings: Not on file  . Marital Status: Not on file    Allergies:  Allergies  Allergen Reactions  . Lisinopril Swelling    Angioedema 06/10/11  . Chantix [Varenicline] Other (See Comments)    Caused insomnia  . Penicillins Hives    Did it involve swelling of the face/tongue/throat, SOB, or low BP? No Did it involve sudden or severe rash/hives, skin peeling, or any reaction on the inside of your mouth or nose? No Did you need to seek medical attention at a hospital or doctor's office? No When did it last happen?50 Years If all above answers are "NO", may proceed with cephalosporin use.      Objective:    Vital Signs:   Temp:  [97.6 F (36.4 C)] 97.6 F (36.4 C) (02/23 1332) Pulse Rate:  [87-116] 87 (02/23 1830) Resp:  [12-27] 13 (02/23 1915) BP:  (124-141)/(83-99) 131/90 (02/23 1915) SpO2:  [83 %-100 %] 83 % (02/23 1830) Weight:  [67.4 kg] 67.4 kg (02/23 1845)    Weight change: Filed Weights   05/18/19 1845  Weight: 67.4 kg    Intake/Output:   Intake/Output Summary (Last 24 hours) at 05/18/2019 1950 Last data filed at 05/18/2019 1600 Gross per 24 hour  Intake 500 ml  Output --  Net 500 ml      Physical Exam    General:  Lethargic critically ill appearing cachetic HEENT: normal Neck: supple. No obvious . Carotids 2+ bilat; no bruits. No lymphadenopathy or thyromegaly appreciated. Cor: PMI nondisplaced. Tachy regular +s3 2/6 sem RUSB Lungs: clear with decreased BS Abdomen: soft, nontender, nondistended. No hepatosplenomegaly. No bruits or masses. Good bowel sounds. Extremities: no cyanosis, clubbing, rash, edema. Ice cold Neuro: alert & orientedx2, moves all  4 extremities. letharigc   Telemetry   Sinus tach 110 Personally reviewed   EKG    sinus tach with LVH and some ST depression in V6 (Personally reviewed)  Labs   Basic Metabolic Panel: Recent Labs  Lab 05/18/19 1343  NA 135  K 3.7  CL 99  CO2 17*  GLUCOSE 114*  BUN 31*  CREATININE 2.33*  CALCIUM 9.6    Liver Function Tests: No results for input(s): AST, ALT, ALKPHOS, BILITOT, PROT, ALBUMIN in the last 168 hours. No results for  input(s): LIPASE, AMYLASE in the last 168 hours. Recent Labs  Lab 05/18/19 1544  AMMONIA 18    CBC: Recent Labs  Lab 05/18/19 1343  WBC 9.4  HGB 13.6  HCT 43.0  MCV 80.4  PLT 90*    Cardiac Enzymes: No results for input(s): CKTOTAL, CKMB, CKMBINDEX, TROPONINI in the last 168 hours.  BNP: BNP (last 3 results) Recent Labs    05/18/19 1707  BNP >4,500.0*    ProBNP (last 3 results) No results for input(s): PROBNP in the last 8760 hours.   CBG: No results for input(s): GLUCAP in the last 168 hours.  Coagulation Studies: No results for input(s): LABPROT, INR in the last 72 hours.   Imaging    CT ABDOMEN PELVIS WO CONTRAST  Result Date: 05/18/2019 CLINICAL DATA:  Shortness of breath abdominal and back pain EXAM: CT CHEST, abdomen, and pelvis WITHOUT CONTRAST TECHNIQUE: Multidetector CT imaging of the chest, abdomen, and pelvis was performed following the standard protocol without IV contrast. COMPARISON:  March 15, 2019 FINDINGS: Cardiovascular: Again noted is moderate cardiomegaly. Prosthetic mitral valve is seen. Coronary artery calcifications are noted. There is a tortuous descending intrathoracic aorta with a focal area of ectasia, measuring up to 4 cm, not significantly changed since the prior exam. Intrathoracic aortic calcifications are noted. Mediastinum/Nodes: There are no enlarged mediastinal, hilar or axillary lymph nodes. The thyroid gland, trachea and esophagus demonstrate no significant findings. Lungs/Pleura: Centrilobular emphysematous changes are seen at both lung apices. There is been a prior partial right upper lobectomy with chain suture. No large airspace consolidation or pleural effusion. Upper abdomen: The visualized portion of the upper abdomen is unremarkable. Musculoskeletal/Chest wall: There is no chest wall mass or suspicious osseous finding. No acute osseous abnormality overlying median sternotomy wires. Abdomen/pelvis: Hepatobiliary: Although limited due to the lack of intravenous contrast, normal in appearance without gross focal abnormality. No evidence of calcified gallstones or biliary ductal dilatation. Pancreas:  Unremarkable.  No surrounding inflammatory changes. Spleen: Normal in size. Although limited due to the lack of intravenous contrast, normal in appearance. Adrenals/Urinary Tract: Both adrenal glands appear normal. The kidneys and collecting system appear normal without evidence of urinary tract calculus or hydronephrosis. Bladder is unremarkable. Stomach/Bowel: The stomach, small bowel, are normal in appearance. There is scattered colonic diverticula.  Mildly prominent air-filled rectum with stool is seen. No focal inflammatory changes. Fall Vascular/Lymphatic: There are no enlarged abdominal or pelvic lymph nodes. Bilateral renal arterial stents are seen. There are dense vascular calcifications seen throughout. Reproductive: The patient is status post hysterectomy. No adnexal masses or collections seen. Other: No evidence of abdominal wall mass or hernia. Musculoskeletal: No acute or significant osseous findings. IMPRESSION: 1. Moderate cardiomegaly as on prior exam. 2. No significant change in the focal ectasia of the intrathoracic descending aorta measuring up to 4 cm. This is unchanged dating back to 2019. 3. Centrilobular emphysematous changes and postsurgical changes of a partial right upper lobectomy. 4. Diverticulosis without diverticulitis. 5.  Aortic Atherosclerosis (ICD10-I70.0). Electronically Signed   By: Prudencio Pair M.D.   On: 05/18/2019 17:50   DG Chest 2 View  Result Date: 05/18/2019 CLINICAL DATA:  Chest pain.  History of lung cancer with lobectomy EXAM: CHEST - 2 VIEW COMPARISON:  CT chest 03/15/2019.  Chest two-view 06/05/2017 FINDINGS: Median sternotomy with mitral valve replacement. Cardiac enlargement without heart failure. No significant effusion. Negative for pneumonia Lobectomy right upper lobe with surgical clips. Mild prominence of the right hilum however no mass  is seen in this area on recent CT. Findings may be postsurgical. There is scarring in the right upper lobe. IMPRESSION: Cardiac enlargement with mitral valve replacement. Negative for heart failure Postsurgical changes the right upper lobe for lobectomy. No acute infiltrate or mass. Electronically Signed   By: Franchot Gallo M.D.   On: 05/18/2019 14:29   CT Head Wo Contrast  Result Date: 05/18/2019 CLINICAL DATA:  Encephalopathy EXAM: CT HEAD WITHOUT CONTRAST TECHNIQUE: Contiguous axial images were obtained from the base of the skull through the vertex without  intravenous contrast. COMPARISON:  November 28, 2014 FINDINGS: Brain: No evidence of acute territorial infarction, hemorrhage, hydrocephalus,extra-axial collection or mass lesion/mass effect. There is dilatation the ventricles and sulci consistent with age-related atrophy. Low-attenuation changes in the deep white matter consistent with small vessel ischemia. Vascular: No hyperdense vessel or unexpected calcification. Skull: The skull is intact. No fracture or focal lesion identified. Sinuses/Orbits: The visualized paranasal sinuses and mastoid air cells are clear. The orbits and globes intact. Other: None IMPRESSION: No acute intracranial abnormality. Findings consistent with age related atrophy and chronic small vessel ischemia Electronically Signed   By: Prudencio Pair M.D.   On: 05/18/2019 16:55   CT Chest Wo Contrast  Result Date: 05/18/2019 CLINICAL DATA:  Shortness of breath abdominal and back pain EXAM: CT CHEST, abdomen, and pelvis WITHOUT CONTRAST TECHNIQUE: Multidetector CT imaging of the chest, abdomen, and pelvis was performed following the standard protocol without IV contrast. COMPARISON:  March 15, 2019 FINDINGS: Cardiovascular: Again noted is moderate cardiomegaly. Prosthetic mitral valve is seen. Coronary artery calcifications are noted. There is a tortuous descending intrathoracic aorta with a focal area of ectasia, measuring up to 4 cm, not significantly changed since the prior exam. Intrathoracic aortic calcifications are noted. Mediastinum/Nodes: There are no enlarged mediastinal, hilar or axillary lymph nodes. The thyroid gland, trachea and esophagus demonstrate no significant findings. Lungs/Pleura: Centrilobular emphysematous changes are seen at both lung apices. There is been a prior partial right upper lobectomy with chain suture. No large airspace consolidation or pleural effusion. Upper abdomen: The visualized portion of the upper abdomen is unremarkable. Musculoskeletal/Chest wall:  There is no chest wall mass or suspicious osseous finding. No acute osseous abnormality overlying median sternotomy wires. Abdomen/pelvis: Hepatobiliary: Although limited due to the lack of intravenous contrast, normal in appearance without gross focal abnormality. No evidence of calcified gallstones or biliary ductal dilatation. Pancreas:  Unremarkable.  No surrounding inflammatory changes. Spleen: Normal in size. Although limited due to the lack of intravenous contrast, normal in appearance. Adrenals/Urinary Tract: Both adrenal glands appear normal. The kidneys and collecting system appear normal without evidence of urinary tract calculus or hydronephrosis. Bladder is unremarkable. Stomach/Bowel: The stomach, small bowel, are normal in appearance. There is scattered colonic diverticula. Mildly prominent air-filled rectum with stool is seen. No focal inflammatory changes. Fall Vascular/Lymphatic: There are no enlarged abdominal or pelvic lymph nodes. Bilateral renal arterial stents are seen. There are dense vascular calcifications seen throughout. Reproductive: The patient is status post hysterectomy. No adnexal masses or collections seen. Other: No evidence of abdominal wall mass or hernia. Musculoskeletal: No acute or significant osseous findings. IMPRESSION: 1. Moderate cardiomegaly as on prior exam. 2. No significant change in the focal ectasia of the intrathoracic descending aorta measuring up to 4 cm. This is unchanged dating back to 2019. 3. Centrilobular emphysematous changes and postsurgical changes of a partial right upper lobectomy. 4. Diverticulosis without diverticulitis. 5.  Aortic Atherosclerosis (ICD10-I70.0). Electronically Signed  By: Prudencio Pair M.D.   On: 05/18/2019 17:50   VAS [place] LOWER EXTREMITY VENOUS (DVT) (ONLY [facility] & [facility])  Result Date: 05/18/2019  Lower Venous DVTStudy Indications: Edema.  Limitations: Poor ultrasound/tissue interface and atherosclerosis, patient position and  cooperation. Performing Technologist: Antonieta Pert RDMS, RVT  Examination Guidelines: A complete evaluation includes B-mode imaging, spectral Doppler, color Doppler, and power Doppler as needed of all accessible portions of each vessel. Bilateral testing is considered an integral part of a complete examination. Limited examinations for reoccurring indications may be performed as noted. The reflux portion of the exam is performed with the patient in reverse Trendelenburg.  +---------+---------------+---------+-----------+----------+------------------+ RIGHT    CompressibilityPhasicitySpontaneityPropertiesThrombus Aging     +---------+---------------+---------+-----------+----------+------------------+ CFV      Full           Yes      Yes                                     +---------+---------------+---------+-----------+----------+------------------+ SFJ      Full                                                            +---------+---------------+---------+-----------+----------+------------------+ FV Prox  Full                                                            +---------+---------------+---------+-----------+----------+------------------+ FV Mid   Full                                                            +---------+---------------+---------+-----------+----------+------------------+ FV DistalFull                                                            +---------+---------------+---------+-----------+----------+------------------+ PFV      Full                                                            +---------+---------------+---------+-----------+----------+------------------+ POP      Full           Yes      Yes                                     +---------+---------------+---------+-----------+----------+------------------+ PTV      Full  poor visualization  +---------+---------------+---------+-----------+----------+------------------+ PERO     Full                                         poor visualization +---------+---------------+---------+-----------+----------+------------------+ GSV      Full                                                            +---------+---------------+---------+-----------+----------+------------------+   +---------+---------------+---------+-----------+----------+------------------+ LEFT     CompressibilityPhasicitySpontaneityPropertiesThrombus Aging     +---------+---------------+---------+-----------+----------+------------------+ CFV      Full           Yes      Yes                                     +---------+---------------+---------+-----------+----------+------------------+ SFJ      Full                                                            +---------+---------------+---------+-----------+----------+------------------+ FV Prox  Full                                                            +---------+---------------+---------+-----------+----------+------------------+ FV Mid   Full                                                            +---------+---------------+---------+-----------+----------+------------------+ FV DistalFull                                                            +---------+---------------+---------+-----------+----------+------------------+ PFV      Full                                                            +---------+---------------+---------+-----------+----------+------------------+ POP      Full           Yes      Yes                                     +---------+---------------+---------+-----------+----------+------------------+ PTV      Full  poor visualization +---------+---------------+---------+-----------+----------+------------------+ PERO     Full                                          poor visualization +---------+---------------+---------+-----------+----------+------------------+ GSV      Full                                                            +---------+---------------+---------+-----------+----------+------------------+     Summary: RIGHT: - There is no evidence of deep vein thrombosis in the lower extremity. However, portions of this examination were limited- see technologist comments above.  - No cystic structure found in the popliteal fossa.  LEFT: - There is no evidence of deep vein thrombosis in the lower extremity. However, portions of this examination were limited- see technologist comments above.  - No cystic structure found in the popliteal fossa.  *See table(s) above for measurements and observations. Electronically signed by Servando Snare MD on 05/18/2019 at 6:06:39 PM.    Final       Medications:     Current Medications: . aspirin EC  81 mg Oral Daily  . atorvastatin  80 mg Oral Daily  . clopidogrel  75 mg Oral Daily  . ezetimibe  10 mg Oral Daily  . fluticasone  1 puff Inhalation Daily  . gabapentin  200 mg Oral TID  . heparin  4,000 Units Intravenous Once  . metoprolol succinate  25 mg Oral Daily  . sodium chloride flush  3 mL Intravenous Once     Infusions: . heparin         Assessment/Plan   1. Cardiogenic shock with sever biventricular dysfunction - Previous EF 40% in 10/18 - Echo in ER EF 10% with severe RV dysfunction. Moderate to severe AI - Suspect acute viral myocarditis - she appears cold and dry - lactate > 6.7 - place swan to guide inotropes - not candidate for mechanical support with PAD and severe AI  2. CAD - s/p CABG with LIMA to LAD and MVR (bioprosthetic) - Trop 407 -> 387 - doubt ACS  3. Severe AI - consider TEE. If/when she recovers. - check BCx  4. Lung cancer s/p resection - stable  5. Severe PAD - s/p left fem-pop and L CEA - bilateral RAS s/p stentig  6.  AKI - due to shock  7. AMS - likely due to shock   CRITICAL CARE Performed by: Glori Bickers  Total critical care time: 55 minutes  Critical care time was exclusive of separately billable procedures and treating other patients.  Critical care was necessary to treat or prevent imminent or life-threatening deterioration.  Critical care was time spent personally by me (independent of midlevel providers or residents) on the following activities: development of treatment plan with patient and/or surrogate as well as nursing, discussions with consultants, evaluation of patient's response to treatment, examination of patient, obtaining history from patient or surrogate, ordering and performing treatments and interventions, ordering and review of laboratory studies, ordering and review of radiographic studies, pulse oximetry and re-evaluation of patient's condition.   Length of Stay: 0  Glori Bickers, MD  05/18/2019, 7:50 PM  Advanced Heart Failure Team Pager [phone] (M-F; 7a - 4p)  Please contact [facility] for night-coverage after hours (4p -7a ) and weekends on [email or web address]

## 2019-05-18 NOTE — Progress Notes (Signed)
  Echocardiogram 2D Echocardiogram has been performed.  Leah [name] 05/18/2019, 7:30 PM

## 2019-05-18 NOTE — ED Provider Notes (Addendum)
[facility] EMERGENCY DEPARTMENT Provider Note   CSN: [id number] Arrival date & time: 05/18/19  1327   History Chief Complaint  Patient presents with  . Chest Pain   KARLEA MCKIBBIN is a 69 y.o. female with extensive past medical history presents for evaluation of chest pain.  Patient altered on arrival.  Will answer intermittent questions however falls asleep.  She only states she has had pain to the middle of her chest.  She is unable to describe this or tell me when this started.  She is on oxygen on nasal cannula which she states she is on at home however does not know how much oxygen she is on. ( Notes in Epic show no O2 use at home)  She is able to move all 4 extremities spontaneously.  Level 5 Caveat- AMS  Attempted to contact [name] brother in [place] at [phone] to no voice mail and [phone]- Person states no [name] at that number.  Nursing states family member Maureen Ralphs (son) called. States patient AMS since yesterday. Not at her baseline. Complained of ABD pain and CP this morning unknown time. States patient last seen in chair yesterday evening. Family awoke this morning and patient was still in her chair non responsive. She is taking Flexeril to sleep at night for pain.  Followed by Dr. Martinique with Cardiology   Per family Patient is a FULL CODE  HPI     Past Medical History:  Diagnosis Date  . Anemia   . Bronchogenic lung cancer, right ([place]) 11/29/2016  . CAD (coronary artery disease)    a. s/p LIMA-LAD in 11/2014  . Carotid artery occlusion   . CHF (congestive heart failure) ([place])   . Complication of anesthesia    slow to awaken x 1 maybe 2001  . COPD (chronic obstructive pulmonary disease) ([place])   . GERD (gastroesophageal reflux disease)    "sometimes" takes Copywriter, advertising or drinks gingerale   . Headache(784.0)   . History of kidney stones   . Hyperlipidemia   . Hypertension   . Leg pain   . Peripheral  vascular disease ([place])   . Renal vascular disease 10/26/2014   bilateral stents placed   . Severe mitral regurgitation    a. s/p MVR in 11/2014 with a pericardial tissue valve  . Shortness of breath dyspnea   . Tobacco abuse     Patient Active Problem List   Diagnosis Date Noted  . AMS (altered mental status) 05/18/2019  . Cardiogenic shock ([place]) 05/18/2019  . Shortness of breath 04/15/2019  . Thoracic aortic aneurysm without rupture ([place]) 09/03/2018  . Viral upper respiratory illness 03/16/2018  . Hypokalemia 02/12/2018  . Claudication ([place]) 11/21/2017  . Seasonal allergies 07/28/2017  . Bilateral sciatica 07/02/2017  . Bilateral hearing loss due to cerumen impaction 07/02/2017  . CHF (congestive heart failure) ([place]) 06/05/2017  . Bronchogenic lung cancer, right ([place]) 11/29/2016  . Dysphagia 08/26/2016  . Health care maintenance 04/17/2016  . Dizziness 04/17/2016  . Decreased visual acuity 01/17/2016  . Depressed mood 07/27/2015  . S/P MVR (mitral valve replacement) 12/05/2014  . Moderate aortic regurgitation 10/26/2014  . Renal vascular disease 10/26/2014  . Acute on chronic diastolic congestive heart failure ([place]) 10/21/2014  . Tobacco use 10/21/2014  . Diastolic dysfunction, grade 2 by echo June 2016 10/21/2014  . Carpal tunnel syndrome 10/17/2014  . Severe mitral regurgitation 08/25/2014  . Unstable angina ([place]) 08/22/2014  . Low back pain 07/05/2012  .  PVC (premature ventricular contraction) 10/02/2011  . History of angioedema with ACE 2013 06/14/2011  . PVD- s/p multiple proceedures 06/04/2011  . Hyperlipidemia 09/28/2008  . Essential hypertension, benign 09/28/2008  . CAD S/P LAD DES 2009 with 70% ISR 08/24/14 09/28/2008    Past Surgical History:  Procedure Laterality Date  . ABDOMINAL AORTOGRAM W/LOWER EXTREMITY Left 09/16/2018   Procedure: ABDOMINAL AORTOGRAM W/LOWER EXTREMITY;  Surgeon: Marty Heck, MD;  Location: [facility];  Service:  Cardiovascular;  Laterality: Left;  . ABDOMINAL HYSTERECTOMY    . ANGIOPLASTY / STENTING ILIAC  2010   right external iliac by Dr. Irish Lack  . CARDIAC CATHETERIZATION  11/13/11   Left Heart Cath. with Coronary Angiogram  . CARDIAC CATHETERIZATION N/A 08/24/2014   Procedure: Left Heart Cath and Coronary Angiography;  Surgeon: Wellington Hampshire, MD;  Location: [facility];  Service: Cardiovascular;  Laterality: N/A;  . CARDIAC CATHETERIZATION N/A 10/26/2014   Procedure: Right/Left Heart Cath and Coronary Angiography;  Surgeon: Peter M Martinique, MD; oLAD 70%, mLAD 70% ISR, D2 30%, OFC 30%, RCA 20%, EF nl, low R heart pressures after diuresis, severe MR  . CAROTID ENDARTERECTOMY  11/21/2007   left  . COLONOSCOPY W/ POLYPECTOMY    . CORONARY ANGIOPLASTY WITH STENT PLACEMENT  6/09   LAD 2.5x12 [place]  . CORONARY ARTERY BYPASS GRAFT N/A 12/05/2014   Procedure: CORONARY ARTERY BYPASS GRAFTING (CABG);  Surgeon: [name], MD;  Location: [facility];  Service: Open Heart Surgery;  Laterality: N/A;  Times 1 using left internal mammary artery to LAD  . FEMORAL-POPLITEAL BYPASS GRAFT  10/12   left Dr. Kellie Simmering  . LOWER EXTREMITY ANGIOGRAPHY N/A 02/04/2018   Procedure: LOWER EXTREMITY ANGIOGRAPHY;  Surgeon: Marty Heck, MD;  Location: [facility];  Service: Cardiovascular;  Laterality: N/A;  . MITRAL VALVE REPAIR  2016  . MITRAL VALVE REPLACEMENT N/A 12/05/2014   Procedure: MITRAL VALVE (MV) REPLACEMENT;  Surgeon: [name], MD;  Location: [facility];  Service: Open Heart Surgery;  Laterality: N/A;  Closure left atrial appendage  . PERIPHERAL VASCULAR BALLOON ANGIOPLASTY Bilateral 09/16/2018   Procedure: PERIPHERAL VASCULAR BALLOON ANGIOPLASTY;  Surgeon: Marty Heck, MD;  Location: [facility];  Service: Cardiovascular;  Laterality: Bilateral;  bilateral renal arteries, Left external iliac  . PERIPHERAL VASCULAR INTERVENTION Left 02/04/2018   Procedure: PERIPHERAL VASCULAR  INTERVENTION;  Surgeon: Marty Heck, MD;  Location: [facility];  Service: Cardiovascular;  Laterality: Left;  external iliac  . RENAL ANGIOGRAPHY Bilateral 09/16/2018   Procedure: RENAL ANGIOGRAPHY;  Surgeon: Marty Heck, MD;  Location: [facility];  Service: Cardiovascular;  Laterality: Bilateral;  . RENAL ARTERY STENT Bilateral   . TEE WITHOUT CARDIOVERSION N/A 10/24/2014   Procedure: TRANSESOPHAGEAL ECHOCARDIOGRAM (TEE);  Surgeon: Thayer Headings, MD;  Location: [facility];  Service: Cardiovascular;  Laterality: N/A;  . TEE WITHOUT CARDIOVERSION N/A 12/05/2014   Procedure: TRANSESOPHAGEAL ECHOCARDIOGRAM (TEE);  Surgeon: [name], MD;  Location: [facility];  Service: Open Heart Surgery;  Laterality: N/A;  . VIDEO ASSISTED THORACOSCOPY (VATS)/WEDGE RESECTION Right 11/29/2016   Procedure: VIDEO ASSISTED THORACOSCOPY (VATS)/ RUL WEDGE RESECTION OF LESION/ NODE SAMPLING;  Surgeon: [name], MD;  Location: [facility];  Service: Thoracic;  Laterality: Right;  Marland Kitchen VIDEO BRONCHOSCOPY N/A 11/29/2016   Procedure: VIDEO BRONCHOSCOPY;  Surgeon: [name], MD;  Location: [facility];  Service: Thoracic;  Laterality: N/A;  OB History   No obstetric history on file.     Family History  Problem Relation Age of Onset  . Cancer Mother        BRAIN AND LUNG  . Hypertension Mother   . Hypertension Father   . Heart disease Father   . Prostate cancer Father   . Kidney disease Father   . Hypertension Brother   . ALS Brother   . Stroke Paternal Aunt        great aunt  . Heart attack Neg Hx   . Colon cancer Neg Hx   . Stomach cancer Neg Hx   . Rectal cancer Neg Hx   . Esophageal cancer Neg Hx   . Liver cancer Neg Hx     Social History   Tobacco Use  . Smoking status: Current Every Day Smoker    Packs/day: 0.50    Years: 35.00    Pack years: 17.50    Types: Cigarettes  . Smokeless tobacco: Never Used  Substance Use Topics  . Alcohol use: Yes     Alcohol/week: 0.0 standard drinks    Comment: occasion  . Drug use: No    Home Medications Prior to Admission medications   Medication Sig Start Date End Date Taking? Authorizing Provider  cyclobenzaprine (FLEXERIL) 10 MG tablet Take 1 tablet by mouth three times daily as needed for muscle spasm 04/30/19   Guadalupe Dawn, MD  traMADol (ULTRAM) 50 MG tablet TAKE 1 TABLET BY MOUTH EVERY 6 HOURS AS NEEDED FOR PAIN 04/30/19   Guadalupe Dawn, MD  albuterol (PROVENTIL HFA;VENTOLIN HFA) 108 (90 Base) MCG/ACT inhaler Inhale 2 puffs into the lungs every 6 (six) hours as needed for wheezing or shortness of breath. 02/20/16   Archie Patten, MD  aspirin EC 81 MG tablet Take 1 tablet (81 mg total) by mouth daily. 07/24/16   Archie Patten, MD  atorvastatin (LIPITOR) 80 MG tablet Take 1 tablet (80 mg total) by mouth daily. 05/01/18 04/01/19  Martinique, Peter M, MD  clopidogrel (PLAVIX) 75 MG tablet Take 1 tablet by mouth once daily 02/26/19   Marty Heck, MD  ezetimibe (ZETIA) 10 MG tablet Take 1 tablet (10 mg total) by mouth daily. Patient taking differently: Take 10 mg by mouth daily at 3 pm.  03/04/18 04/01/19  Martinique, Peter M, MD  fluticasone (FLOVENT HFA) 44 MCG/ACT inhaler Inhale 1 puff into the lungs daily. 04/12/19   Guadalupe Dawn, MD  furosemide (LASIX) 40 MG tablet Take 0.5 tablets (20 mg total) by mouth daily. Patient taking differently: Take 40 mg by mouth every evening.  06/29/18   Duke, Tami Lin, PA  gabapentin (NEURONTIN) 100 MG capsule TAKE 2 CAPSULES BY MOUTH THREE TIMES DAILY 02/28/19   Guadalupe Dawn, MD  isosorbide-hydrALAZINE (BIDIL) 20-37.5 MG tablet Take 1 tablet by mouth 3 (three) times daily. 03/10/18   Guadalupe Dawn, MD  metoprolol succinate (TOPROL-XL) 25 MG 24 hr tablet Take 1 tablet (25 mg total) by mouth daily. 06/24/18   Martinique, Peter M, MD  nitroGLYCERIN (NITROSTAT) 0.4 MG SL tablet Place 1 tablet (0.4 mg total) under the tongue every 5 (five) minutes x 3 doses as needed  for chest pain. 01/29/19   Martinique, Peter M, MD  potassium chloride SA (KLOR-CON) 20 MEQ tablet Take 2 tablets ( 40 meq ) 4 times a day. 05/07/19   Martinique, Peter M, MD  traZODone (DESYREL) 100 MG tablet TAKE 1 TABLET BY MOUTH AT BEDTIME 02/23/19  Guadalupe Dawn, MD    Allergies    Lisinopril, Chantix [varenicline], and Penicillins  Review of Systems   Review of Systems  Unable to perform ROS: Mental status change  Cardiovascular: Positive for chest pain.   Physical Exam Updated Vital Signs BP 131/90   Pulse 87   Temp 97.6 F (36.4 C) (Oral)   Resp 13   Ht 6\' 1"  (1.854 m)   Wt 67.4 kg   SpO2 (!) 83%   BMI 19.61 kg/m   Physical Exam Vitals and nursing note reviewed.  Constitutional:      General: She is not in acute distress.    Appearance: She is well-developed. She is ill-appearing (Chronically ill). She is not toxic-appearing.  HENT:     Head: Atraumatic.  Eyes:     Pupils: Pupils are equal, round, and reactive to light.  Cardiovascular:     Rate and Rhythm: Tachycardia present.     Pulses:          Radial pulses are 2+ on the right side and 2+ on the left side.       Dorsalis pedis pulses are 2+ on the right side and 2+ on the left side.     Heart sounds: Normal heart sounds.  Pulmonary:     Effort: Pulmonary effort is normal. No respiratory distress.     Comments: 3 L [place] Chest:     Comments: Old midline scar to chest without infectious changes Abdominal:     General: Bowel sounds are normal. There is no distension.     Palpations: Abdomen is soft.     Tenderness: There is no abdominal tenderness.     Comments: Soft, non tender  Musculoskeletal:        General: Normal range of motion.     Cervical back: Normal range of motion.     Comments: Moves all 4 extremities without difficulty  Skin:    General: Skin is warm and dry.     Capillary Refill: Capillary refill takes 2 to 3 seconds.     Comments: Tactile temp to extremities. Old scars to extremities    Neurological:     Mental Status: She is disoriented and confused.     Comments: Moves extremities without difficulty. Tracks with bilateral eyes. Tongue midline. No obvious facial droop. Able to hold up bilateral upper and lower extremities without difficulty.  Vented to person and place however not birthdate and year.    ED Results / Procedures / Treatments   Labs (all labs ordered are listed, but only abnormal results are displayed) Labs Reviewed  BASIC METABOLIC PANEL - Abnormal; Notable for the following components:      Result Value   CO2 17 (*)    Glucose, Bld 114 (*)    BUN 31 (*)    Creatinine, Ser 2.33 (*)    GFR calc non Af Amer 21 (*)    GFR calc Af Amer 24 (*)    Anion gap 19 (*)    All other components within normal limits  CBC - Abnormal; Notable for the following components:   RBC 5.35 (*)    MCH 25.4 (*)    RDW 21.5 (*)    Platelets 90 (*)    nRBC 1.6 (*)    All other components within normal limits  URINALYSIS, ROUTINE W REFLEX MICROSCOPIC - Abnormal; Notable for the following components:   Color, Urine AMBER (*)    Bilirubin Urine SMALL (*)    Protein,  ur 30 (*)    All other components within normal limits  LACTIC ACID, PLASMA - Abnormal; Notable for the following components:   Lactic Acid, Venous 6.7 (*)    All other components within normal limits  BRAIN NATRIURETIC PEPTIDE - Abnormal; Notable for the following components:   B Natriuretic Peptide >4,500.0 (*)    All other components within normal limits  TROPONIN I (HIGH SENSITIVITY) - Abnormal; Notable for the following components:   Troponin I (High Sensitivity) 407 (*)    All other components within normal limits  TROPONIN I (HIGH SENSITIVITY) - Abnormal; Notable for the following components:   Troponin I (High Sensitivity) 387 (*)    All other components within normal limits  RESPIRATORY PANEL BY RT PCR (FLU A&B, COVID)  URINE CULTURE  CULTURE, BLOOD (ROUTINE X 2)  CULTURE, BLOOD (ROUTINE X 2)   TSH  ETHANOL  AMMONIA  RAPID URINE DRUG SCREEN, HOSP PERFORMED  LACTIC ACID, PLASMA  HIV ANTIBODY (ROUTINE TESTING W REFLEX)  MAGNESIUM  HEMOGLOBIN A1C  COMPREHENSIVE METABOLIC PANEL  CBC  PROTIME-INR  HEPARIN LEVEL (UNFRACTIONATED)  SODIUM, URINE, RANDOM  OSMOLALITY, URINE  CALCIUM / CREATININE RATIO, URINE  POC SARS CORONAVIRUS 2 AG -  ED  TROPONIN I (HIGH SENSITIVITY)    EKG EKG Interpretation  Date/Time:  Tuesday May 18 2019 13:26:46 EST Ventricular Rate:  118 PR Interval:  148 QRS Duration: 96 QT Interval:  346 QTC Calculation: 484 R Axis:   -63 Text Interpretation: Sinus tachycardia with Fusion complexes Right atrial enlargement Left axis deviation Left ventricular hypertrophy Cannot rule out Septal infarct , age undetermined T wave abnormality, consider lateral ischemia Abnormal ECG No STEMI Confirmed by Nanda Quinton [id number]) on 05/18/2019 3:08:27 PM   Radiology CT ABDOMEN PELVIS WO CONTRAST  Result Date: 05/18/2019 CLINICAL DATA:  Shortness of breath abdominal and back pain EXAM: CT CHEST, abdomen, and pelvis WITHOUT CONTRAST TECHNIQUE: Multidetector CT imaging of the chest, abdomen, and pelvis was performed following the standard protocol without IV contrast. COMPARISON:  March 15, 2019 FINDINGS: Cardiovascular: Again noted is moderate cardiomegaly. Prosthetic mitral valve is seen. Coronary artery calcifications are noted. There is a tortuous descending intrathoracic aorta with a focal area of ectasia, measuring up to 4 cm, not significantly changed since the prior exam. Intrathoracic aortic calcifications are noted. Mediastinum/Nodes: There are no enlarged mediastinal, hilar or axillary lymph nodes. The thyroid gland, trachea and esophagus demonstrate no significant findings. Lungs/Pleura: Centrilobular emphysematous changes are seen at both lung apices. There is been a prior partial right upper lobectomy with chain suture. No large airspace consolidation or  pleural effusion. Upper abdomen: The visualized portion of the upper abdomen is unremarkable. Musculoskeletal/Chest wall: There is no chest wall mass or suspicious osseous finding. No acute osseous abnormality overlying median sternotomy wires. Abdomen/pelvis: Hepatobiliary: Although limited due to the lack of intravenous contrast, normal in appearance without gross focal abnormality. No evidence of calcified gallstones or biliary ductal dilatation. Pancreas:  Unremarkable.  No surrounding inflammatory changes. Spleen: Normal in size. Although limited due to the lack of intravenous contrast, normal in appearance. Adrenals/Urinary Tract: Both adrenal glands appear normal. The kidneys and collecting system appear normal without evidence of urinary tract calculus or hydronephrosis. Bladder is unremarkable. Stomach/Bowel: The stomach, small bowel, are normal in appearance. There is scattered colonic diverticula. Mildly prominent air-filled rectum with stool is seen. No focal inflammatory changes. Fall Vascular/Lymphatic: There are no enlarged abdominal or pelvic lymph nodes. Bilateral renal arterial stents are seen.  There are dense vascular calcifications seen throughout. Reproductive: The patient is status post hysterectomy. No adnexal masses or collections seen. Other: No evidence of abdominal wall mass or hernia. Musculoskeletal: No acute or significant osseous findings. IMPRESSION: 1. Moderate cardiomegaly as on prior exam. 2. No significant change in the focal ectasia of the intrathoracic descending aorta measuring up to 4 cm. This is unchanged dating back to 2019. 3. Centrilobular emphysematous changes and postsurgical changes of a partial right upper lobectomy. 4. Diverticulosis without diverticulitis. 5.  Aortic Atherosclerosis (ICD10-I70.0). Electronically Signed   By: Prudencio Pair M.D.   On: 05/18/2019 17:50   DG Chest 2 View  Result Date: 05/18/2019 CLINICAL DATA:  Chest pain.  History of lung cancer with  lobectomy EXAM: CHEST - 2 VIEW COMPARISON:  CT chest 03/15/2019.  Chest two-view 06/05/2017 FINDINGS: Median sternotomy with mitral valve replacement. Cardiac enlargement without heart failure. No significant effusion. Negative for pneumonia Lobectomy right upper lobe with surgical clips. Mild prominence of the right hilum however no mass is seen in this area on recent CT. Findings may be postsurgical. There is scarring in the right upper lobe. IMPRESSION: Cardiac enlargement with mitral valve replacement. Negative for heart failure Postsurgical changes the right upper lobe for lobectomy. No acute infiltrate or mass. Electronically Signed   By: Franchot Gallo M.D.   On: 05/18/2019 14:29   CT Head Wo Contrast  Result Date: 05/18/2019 CLINICAL DATA:  Encephalopathy EXAM: CT HEAD WITHOUT CONTRAST TECHNIQUE: Contiguous axial images were obtained from the base of the skull through the vertex without intravenous contrast. COMPARISON:  November 28, 2014 FINDINGS: Brain: No evidence of acute territorial infarction, hemorrhage, hydrocephalus,extra-axial collection or mass lesion/mass effect. There is dilatation the ventricles and sulci consistent with age-related atrophy. Low-attenuation changes in the deep white matter consistent with small vessel ischemia. Vascular: No hyperdense vessel or unexpected calcification. Skull: The skull is intact. No fracture or focal lesion identified. Sinuses/Orbits: The visualized paranasal sinuses and mastoid air cells are clear. The orbits and globes intact. Other: None IMPRESSION: No acute intracranial abnormality. Findings consistent with age related atrophy and chronic small vessel ischemia Electronically Signed   By: Prudencio Pair M.D.   On: 05/18/2019 16:55   CT Chest Wo Contrast  Result Date: 05/18/2019 CLINICAL DATA:  Shortness of breath abdominal and back pain EXAM: CT CHEST, abdomen, and pelvis WITHOUT CONTRAST TECHNIQUE: Multidetector CT imaging of the chest, abdomen, and  pelvis was performed following the standard protocol without IV contrast. COMPARISON:  March 15, 2019 FINDINGS: Cardiovascular: Again noted is moderate cardiomegaly. Prosthetic mitral valve is seen. Coronary artery calcifications are noted. There is a tortuous descending intrathoracic aorta with a focal area of ectasia, measuring up to 4 cm, not significantly changed since the prior exam. Intrathoracic aortic calcifications are noted. Mediastinum/Nodes: There are no enlarged mediastinal, hilar or axillary lymph nodes. The thyroid gland, trachea and esophagus demonstrate no significant findings. Lungs/Pleura: Centrilobular emphysematous changes are seen at both lung apices. There is been a prior partial right upper lobectomy with chain suture. No large airspace consolidation or pleural effusion. Upper abdomen: The visualized portion of the upper abdomen is unremarkable. Musculoskeletal/Chest wall: There is no chest wall mass or suspicious osseous finding. No acute osseous abnormality overlying median sternotomy wires. Abdomen/pelvis: Hepatobiliary: Although limited due to the lack of intravenous contrast, normal in appearance without gross focal abnormality. No evidence of calcified gallstones or biliary ductal dilatation. Pancreas:  Unremarkable.  No surrounding inflammatory changes. Spleen: Normal in size.  Although limited due to the lack of intravenous contrast, normal in appearance. Adrenals/Urinary Tract: Both adrenal glands appear normal. The kidneys and collecting system appear normal without evidence of urinary tract calculus or hydronephrosis. Bladder is unremarkable. Stomach/Bowel: The stomach, small bowel, are normal in appearance. There is scattered colonic diverticula. Mildly prominent air-filled rectum with stool is seen. No focal inflammatory changes. Fall Vascular/Lymphatic: There are no enlarged abdominal or pelvic lymph nodes. Bilateral renal arterial stents are seen. There are dense vascular  calcifications seen throughout. Reproductive: The patient is status post hysterectomy. No adnexal masses or collections seen. Other: No evidence of abdominal wall mass or hernia. Musculoskeletal: No acute or significant osseous findings. IMPRESSION: 1. Moderate cardiomegaly as on prior exam. 2. No significant change in the focal ectasia of the intrathoracic descending aorta measuring up to 4 cm. This is unchanged dating back to 2019. 3. Centrilobular emphysematous changes and postsurgical changes of a partial right upper lobectomy. 4. Diverticulosis without diverticulitis. 5.  Aortic Atherosclerosis (ICD10-I70.0). Electronically Signed   By: Prudencio Pair M.D.   On: 05/18/2019 17:50   VAS [place] LOWER EXTREMITY VENOUS (DVT) (ONLY [facility] & [facility])  Result Date: 05/18/2019  Lower Venous DVTStudy Indications: Edema.  Limitations: Poor ultrasound/tissue interface and atherosclerosis, patient position and cooperation. Performing Technologist: Antonieta Pert RDMS, RVT  Examination Guidelines: A complete evaluation includes B-mode imaging, spectral Doppler, color Doppler, and power Doppler as needed of all accessible portions of each vessel. Bilateral testing is considered an integral part of a complete examination. Limited examinations for reoccurring indications may be performed as noted. The reflux portion of the exam is performed with the patient in reverse Trendelenburg.  +---------+---------------+---------+-----------+----------+------------------+ RIGHT    CompressibilityPhasicitySpontaneityPropertiesThrombus Aging     +---------+---------------+---------+-----------+----------+------------------+ CFV      Full           Yes      Yes                                     +---------+---------------+---------+-----------+----------+------------------+ SFJ      Full                                                            +---------+---------------+---------+-----------+----------+------------------+ FV  Prox  Full                                                            +---------+---------------+---------+-----------+----------+------------------+ FV Mid   Full                                                            +---------+---------------+---------+-----------+----------+------------------+ FV DistalFull                                                            +---------+---------------+---------+-----------+----------+------------------+  PFV      Full                                                            +---------+---------------+---------+-----------+----------+------------------+ POP      Full           Yes      Yes                                     +---------+---------------+---------+-----------+----------+------------------+ PTV      Full                                         poor visualization +---------+---------------+---------+-----------+----------+------------------+ PERO     Full                                         poor visualization +---------+---------------+---------+-----------+----------+------------------+ GSV      Full                                                            +---------+---------------+---------+-----------+----------+------------------+   +---------+---------------+---------+-----------+----------+------------------+ LEFT     CompressibilityPhasicitySpontaneityPropertiesThrombus Aging     +---------+---------------+---------+-----------+----------+------------------+ CFV      Full           Yes      Yes                                     +---------+---------------+---------+-----------+----------+------------------+ SFJ      Full                                                            +---------+---------------+---------+-----------+----------+------------------+ FV Prox  Full                                                             +---------+---------------+---------+-----------+----------+------------------+ FV Mid   Full                                                            +---------+---------------+---------+-----------+----------+------------------+ FV DistalFull                                                            +---------+---------------+---------+-----------+----------+------------------+  PFV      Full                                                            +---------+---------------+---------+-----------+----------+------------------+ POP      Full           Yes      Yes                                     +---------+---------------+---------+-----------+----------+------------------+ PTV      Full                                         poor visualization +---------+---------------+---------+-----------+----------+------------------+ PERO     Full                                         poor visualization +---------+---------------+---------+-----------+----------+------------------+ GSV      Full                                                            +---------+---------------+---------+-----------+----------+------------------+     Summary: RIGHT: - There is no evidence of deep vein thrombosis in the lower extremity. However, portions of this examination were limited- see technologist comments above.  - No cystic structure found in the popliteal fossa.  LEFT: - There is no evidence of deep vein thrombosis in the lower extremity. However, portions of this examination were limited- see technologist comments above.  - No cystic structure found in the popliteal fossa.  *See table(s) above for measurements and observations. Electronically signed by Servando Snare MD on 05/18/2019 at 6:06:39 PM.    Final    Procedures .Critical Care Performed by: Nettie Elm, PA-C Authorized by: Nettie Elm, PA-C   Critical care provider statement:    Critical care  time (minutes):  61   Critical care was necessary to treat or prevent imminent or life-threatening deterioration of the following conditions:  Cardiac failure and circulatory failure   Critical care was time spent personally by me on the following activities:  Discussions with consultants, evaluation of patient's response to treatment, examination of patient, ordering and performing treatments and interventions, ordering and review of laboratory studies, ordering and review of radiographic studies, pulse oximetry, re-evaluation of patient's condition, obtaining history from patient or surrogate and review of old charts   (including critical care time)  Medications Ordered in ED Medications  sodium chloride flush (NS) 0.9 % injection 3 mL (has no administration in time range)  aspirin EC tablet 81 mg (has no administration in time range)  atorvastatin (LIPITOR) tablet 80 mg (has no administration in time range)  ezetimibe (ZETIA) tablet 10 mg (has no administration in time range)  metoprolol succinate (TOPROL-XL) 24 hr tablet 25 mg (has no administration in time range)  clopidogrel (PLAVIX) tablet 75 mg (has no  administration in time range)  cyclobenzaprine (FLEXERIL) tablet 10 mg (has no administration in time range)  gabapentin (NEURONTIN) capsule 200 mg (has no administration in time range)  albuterol (VENTOLIN HFA) 108 (90 Base) MCG/ACT inhaler 2 puff (has no administration in time range)  fluticasone (FLOVENT HFA) 44 MCG/ACT inhaler 1 puff (has no administration in time range)  acetaminophen (TYLENOL) tablet 650 mg (has no administration in time range)    Or  acetaminophen (TYLENOL) suppository 650 mg (has no administration in time range)  polyethylene glycol (MIRALAX / GLYCOLAX) packet 17 g (has no administration in time range)  heparin bolus via infusion 4,000 Units (has no administration in time range)  heparin ADULT infusion 100 units/mL (25000 units/27mL sodium chloride 0.45%) (has  no administration in time range)  sodium chloride 0.9 % bolus 500 mL (0 mLs Intravenous Stopped 05/18/19 1600)   ED Course  I have reviewed the triage vital signs and the nursing notes.  Pertinent labs & imaging results that were available during my care of the patient were reviewed by me and considered in my medical decision making (see chart for details).  53 old patient with extensive past medical history presents for evaluation of chest pain.  Patient altered on arrival.  She is able to follow some commands however does not know her date of birth or the year.  Unknown her baseline as I try to call her contact information, one number bring to no voicemail other number for contact person the answer and said they had the right number.  Use all 4 extremities without difficulty.  Withdrawals to pain.  She is protecting her airway.  She is on 3 L oxygen via nasal cannula.  She can only tell me that she has had some central chest pain however cannot describe this or tell me when this started.  He is intermittently tachycardic in the room and has had a couple 3 episode beats of nonsustained V. Tach.  Labs obtained from triage which show an AKI and elevated troponin to 407.  Given she is altered we will add on CT head.  Plan on consulting cardiology given critically elevated troponin in setting of chest pain.  Will likely need to start on heparin however will wait until head CT results.  Unable to perform CTA chest to assess for PE given her new AKI and low GFR.   CONSULT with Cardiology Trish. Cards will consult.  Labs and imaging personally reviewed and interpreted.  Unfortunate unable to do dissection study due to patient's creatinine.  CT chest and abdomen without show a stable 4 cm dilated aorta.  No acute findings.  CT head negative. Cardiology recommendations will start on heparin admit to hospitalist service.  Cardiology has assessed patient.  Concern for NSTEMI as well as possible cardiogenic  shock.  They have called in stat echo.  States family practice may admit at this time however if in shock they will admit to CCU.  Notify admitting team if change in placement.  CONSULT with Family med teaching who will evaluate patient for admission.  However patient has pending echo.  Considered ACS, PE, dissection, mesenteric ischemia, bacterial infection, CVA.   Patient critically ill.  Will be admitted for further testing and treatment.  Patient discussed with attending physician, Dr. Laverta Baltimore who agrees with the treatment, plan and disposition.  Up dated [name] as well as daughter Nicolette Bang at [phone]  ADDEND: ECHO with significantly reduced EF, patient will be transferred to the  critical care team with cardiology Dr. Haroldine Laws admitting.  Prior admitting team (Family medicine) notified    MDM Rules/Calculators/A&P                       Final Clinical Impression(s) / ED Diagnoses Final diagnoses:  NSTEMI (non-ST elevated myocardial infarction) ([place])  Altered mental status, unspecified altered mental status type  AKI (acute kidney injury) ([place])  Elevated brain natriuretic peptide (BNP) level  Cardiogenic shock [facility])    Rx / DC Orders ED Discharge Orders    None       Kameryn Davern A, PA-C 05/18/19 1925    Nettie Elm, PA-C 05/18/19 1954    Margette Fast, MD 05/19/19 1436

## 2019-05-18 NOTE — Consult Note (Addendum)
Cardiology Consultation:   Patient ID: Leah [name] MRN: [id number]; DOB: 1950-08-14  Admit date: 05/18/2019 Date of Consult: 05/18/2019  Primary Care Provider: Guadalupe Dawn, MD Primary Cardiologist: Peter Martinique, MD  Primary Electrophysiologist:  None    Patient Profile:   Leah [name] is a 69 y.o. 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 Laverta Baltimore.  History of Present Illness:   History was obtained through chart review given AMS.   Leah [name] is followed by Dr. Martinique for the above cardiac issues. Patient underwent CABG and MVR in 11/2014. In August 2018 she had pre-op eval with Myoview study shoing no ischemia and reduced EF. On CT it was noted she had a right upper lobe nodule which had increased in size and therefore she underwent resection in 11/2016 with pathology showing adenocarcinoma stage 1. Echo in October 2018 showed EF 40% with normal mitral valve prosthesis. On 01/2018 she underwent Le angiogram for left leg claudication. She had PTA/stenting of the left external iliac artery. Also noted to have right renal artery stenosis of 80%, treated with stenting. She had a repeat angiography of her lower extremity in June 2020 in the settin gof left femoropopliteal BPG which revealed stenosis in the left external iliac artery and bilateral renal artery in-stent restenosis. This was treated with balloon angioplasty. In 04/2018 the patient reported AMS and a heart monitor was ordered which showed NSR with occasional PVCs with bigeminy, 2 episodes of NSVT 5 and 7 beats. The patient was last seen 04/29/19 and had reported recent N/V with hallucinations. No chest pain or sob. Labs were ordered showing worsening kidney function and  hypokalemia. Potassium was prescribed.   The patient presented to the ED 05/18/19 for AMS. Patient lives with her 2 sons. The sons noted that the patient was acting different for the last day. She was sitting all day and not responding as her usual self. Also noted some slurred speech. They called EMS when things did not get better. It's also possible the patient had some chest pain as well. On my exam patient said she had some CP earlier in the day but is currently chest pain free.   In the ED B/P 133/95, pulse 116, temp 97.6, RR 20, 100% O2. Labs showed potassium 3.7, creatinine 2.33, glucose 114, anion gap 19. WBC 9.4, Hgb 13.6. platelets 90. HS troponin 407. CXR with cardiac enlargement, no HF, no acute infiltrate. CT head ordered. Given IVF bolus. EKG showed sinus tach with LVH and ST depression in V6. Cardiology was consulted for elevated troponin.   Heart Pathway Score:     Past Medical History:  Diagnosis Date  . Anemia   . Bronchogenic lung cancer, right ([place]) 11/29/2016  . CAD (coronary artery disease)    a. s/p LIMA-LAD in 11/2014  . Carotid artery occlusion   . CHF (congestive heart failure) ([place])   . Complication of anesthesia    slow to awaken x 1 maybe 2001  . COPD (chronic obstructive pulmonary disease) ([place])   . GERD (gastroesophageal reflux disease)    "sometimes" takes Copywriter, advertising or drinks gingerale   . Headache(784.0)   . History of kidney stones   . Hyperlipidemia   . Hypertension   .  Leg pain   . Peripheral vascular disease ([place])   . Renal vascular disease 10/26/2014   bilateral stents placed   . Severe mitral regurgitation    a. s/p MVR in 11/2014 with a pericardial tissue valve  . Shortness of breath dyspnea   . Tobacco abuse     Past Surgical History:  Procedure Laterality Date  . ABDOMINAL AORTOGRAM W/LOWER EXTREMITY Left 09/16/2018   Procedure: ABDOMINAL AORTOGRAM W/LOWER EXTREMITY;  Surgeon: Marty Heck, MD;  Location: [facility];   Service: Cardiovascular;  Laterality: Left;  . ABDOMINAL HYSTERECTOMY    . ANGIOPLASTY / STENTING ILIAC  2010   right external iliac by Dr. Irish Lack  . CARDIAC CATHETERIZATION  11/13/11   Left Heart Cath. with Coronary Angiogram  . CARDIAC CATHETERIZATION N/A 08/24/2014   Procedure: Left Heart Cath and Coronary Angiography;  Surgeon: Wellington Hampshire, MD;  Location: [facility];  Service: Cardiovascular;  Laterality: N/A;  . CARDIAC CATHETERIZATION N/A 10/26/2014   Procedure: Right/Left Heart Cath and Coronary Angiography;  Surgeon: Peter M Martinique, MD; oLAD 70%, mLAD 70% ISR, D2 30%, OFC 30%, RCA 20%, EF nl, low R heart pressures after diuresis, severe MR  . CAROTID ENDARTERECTOMY  11/21/2007   left  . COLONOSCOPY W/ POLYPECTOMY    . CORONARY ANGIOPLASTY WITH STENT PLACEMENT  6/09   LAD 2.5x12 [place]  . CORONARY ARTERY BYPASS GRAFT N/A 12/05/2014   Procedure: CORONARY ARTERY BYPASS GRAFTING (CABG);  Surgeon: [name], MD;  Location: [place];  Service: Open Heart Surgery;  Laterality: N/A;  Times 1 using left internal mammary artery to LAD  . FEMORAL-POPLITEAL BYPASS GRAFT  10/12   left Dr. Kellie Simmering  . LOWER EXTREMITY ANGIOGRAPHY N/A 02/04/2018   Procedure: LOWER EXTREMITY ANGIOGRAPHY;  Surgeon: Marty Heck, MD;  Location: [facility];  Service: Cardiovascular;  Laterality: N/A;  . MITRAL VALVE REPAIR  2016  . MITRAL VALVE REPLACEMENT N/A 12/05/2014   Procedure: MITRAL VALVE (MV) REPLACEMENT;  Surgeon: [name], MD;  Location: [facility];  Service: Open Heart Surgery;  Laterality: N/A;  Closure left atrial appendage  . PERIPHERAL VASCULAR BALLOON ANGIOPLASTY Bilateral 09/16/2018   Procedure: PERIPHERAL VASCULAR BALLOON ANGIOPLASTY;  Surgeon: Marty Heck, MD;  Location: [facility];  Service: Cardiovascular;  Laterality: Bilateral;  bilateral renal arteries, Left external iliac  . PERIPHERAL VASCULAR INTERVENTION Left 02/04/2018   Procedure: PERIPHERAL  VASCULAR INTERVENTION;  Surgeon: Marty Heck, MD;  Location: [facility];  Service: Cardiovascular;  Laterality: Left;  external iliac  . RENAL ANGIOGRAPHY Bilateral 09/16/2018   Procedure: RENAL ANGIOGRAPHY;  Surgeon: Marty Heck, MD;  Location: [facility];  Service: Cardiovascular;  Laterality: Bilateral;  . RENAL ARTERY STENT Bilateral   . TEE WITHOUT CARDIOVERSION N/A 10/24/2014   Procedure: TRANSESOPHAGEAL ECHOCARDIOGRAM (TEE);  Surgeon: Thayer Headings, MD;  Location: [facility];  Service: Cardiovascular;  Laterality: N/A;  . TEE WITHOUT CARDIOVERSION N/A 12/05/2014   Procedure: TRANSESOPHAGEAL ECHOCARDIOGRAM (TEE);  Surgeon: [name], MD;  Location: [facility];  Service: Open Heart Surgery;  Laterality: N/A;  . VIDEO ASSISTED THORACOSCOPY (VATS)/WEDGE RESECTION Right 11/29/2016   Procedure: VIDEO ASSISTED THORACOSCOPY (VATS)/ RUL WEDGE RESECTION OF LESION/ NODE SAMPLING;  Surgeon: [name], MD;  Location: [facility];  Service: Thoracic;  Laterality: Right;  Marland Kitchen VIDEO BRONCHOSCOPY N/A 11/29/2016   Procedure: VIDEO BRONCHOSCOPY;  Surgeon: [name], MD;  Location: [facility];  Service: Thoracic;  Laterality: N/A;     Home Medications:  Prior to Admission medications   Medication Sig Start Date End Date Taking? Authorizing Provider  cyclobenzaprine (FLEXERIL) 10 MG tablet Take 1 tablet by mouth three times daily as needed for muscle spasm 04/30/19   Guadalupe Dawn, MD  traMADol (ULTRAM) 50 MG tablet TAKE 1 TABLET BY MOUTH EVERY 6 HOURS AS NEEDED FOR PAIN 04/30/19   Guadalupe Dawn, MD  albuterol (PROVENTIL HFA;VENTOLIN HFA) 108 (90 Base) MCG/ACT inhaler Inhale 2 puffs into the lungs every 6 (six) hours as needed for wheezing or shortness of breath. 02/20/16   Archie Patten, MD  aspirin EC 81 MG tablet Take 1 tablet (81 mg total) by mouth daily. 07/24/16   Archie Patten, MD  atorvastatin (LIPITOR) 80 MG tablet Take 1 tablet (80 mg total) by mouth  daily. 05/01/18 04/01/19  Martinique, Peter M, MD  clopidogrel (PLAVIX) 75 MG tablet Take 1 tablet by mouth once daily 02/26/19   Marty Heck, MD  ezetimibe (ZETIA) 10 MG tablet Take 1 tablet (10 mg total) by mouth daily. Patient taking differently: Take 10 mg by mouth daily at 3 pm.  03/04/18 04/01/19  Martinique, Peter M, MD  fluticasone (FLOVENT HFA) 44 MCG/ACT inhaler Inhale 1 puff into the lungs daily. 04/12/19   Guadalupe Dawn, MD  furosemide (LASIX) 40 MG tablet Take 0.5 tablets (20 mg total) by mouth daily. Patient taking differently: Take 40 mg by mouth every evening.  06/29/18   Duke, Tami Lin, PA  gabapentin (NEURONTIN) 100 MG capsule TAKE 2 CAPSULES BY MOUTH THREE TIMES DAILY 02/28/19   Guadalupe Dawn, MD  isosorbide-hydrALAZINE (BIDIL) 20-37.5 MG tablet Take 1 tablet by mouth 3 (three) times daily. 03/10/18   Guadalupe Dawn, MD  metoprolol succinate (TOPROL-XL) 25 MG 24 hr tablet Take 1 tablet (25 mg total) by mouth daily. 06/24/18   Martinique, Peter M, MD  nitroGLYCERIN (NITROSTAT) 0.4 MG SL tablet Place 1 tablet (0.4 mg total) under the tongue every 5 (five) minutes x 3 doses as needed for chest pain. 01/29/19   Martinique, Peter M, MD  potassium chloride SA (KLOR-CON) 20 MEQ tablet Take 2 tablets ( 40 meq ) 4 times a day. 05/07/19   Martinique, Peter M, MD  traZODone (DESYREL) 100 MG tablet TAKE 1 TABLET BY MOUTH AT BEDTIME 02/23/19   Guadalupe Dawn, MD    Inpatient Medications: Scheduled Meds: . sodium chloride flush  3 mL Intravenous Once   Continuous Infusions:  PRN Meds:   Allergies:    Allergies  Allergen Reactions  . Lisinopril Swelling    Angioedema 06/10/11  . Chantix [Varenicline] Other (See Comments)    Caused insomnia  . Penicillins Hives    Did it involve swelling of the face/tongue/throat, SOB, or low BP? No Did it involve sudden or severe rash/hives, skin peeling, or any reaction on the inside of your mouth or nose? No Did you need to seek medical attention at a hospital  or doctor's office? No When did it last happen?50 Years If all above answers are "NO", may proceed with cephalosporin use.      Social History:   Social History   Socioeconomic History  . Marital status: Divorced    Spouse name: Not on file  . Number of children: 4  . Years of education: Not on file  . Highest education level: Not on file  Occupational History  . Occupation: retired  Tobacco Use  . Smoking status:  Current Every Day Smoker    Packs/day: 0.50    Years: 35.00    Pack years: 17.50    Types: Cigarettes  . Smokeless tobacco: Never Used  Substance and Sexual Activity  . Alcohol use: Yes    Alcohol/week: 0.0 standard drinks    Comment: occasion  . Drug use: No  . Sexual activity: Never  Other Topics Concern  . Not on file  Social History Narrative  . Not on file   Social Determinants of Health   Financial Resource Strain:   . Difficulty of Paying Living Expenses: Not on file  Food Insecurity:   . Worried About Charity fundraiser in the Last Year: Not on file  . Ran Out of Food in the Last Year: Not on file  Transportation Needs:   . Lack of Transportation (Medical): Not on file  . Lack of Transportation (Non-Medical): Not on file  Physical Activity:   . Days of Exercise per Week: Not on file  . Minutes of Exercise per Session: Not on file  Stress:   . Feeling of Stress : Not on file  Social Connections:   . Frequency of Communication with Friends and Family: Not on file  . Frequency of Social Gatherings with Friends and Family: Not on file  . Attends Religious Services: Not on file  . Active Member of Clubs or Organizations: Not on file  . Attends Archivist Meetings: Not on file  . Marital Status: Not on file  Intimate Partner Violence:   . Fear of Current or Ex-Partner: Not on file  . Emotionally Abused: Not on file  . Physically Abused: Not on file  . Sexually Abused: Not on file    Family History:   Family History    Problem Relation Age of Onset  . Cancer Mother        BRAIN AND LUNG  . Hypertension Mother   . Hypertension Father   . Heart disease Father   . Prostate cancer Father   . Kidney disease Father   . Hypertension Brother   . ALS Brother   . Stroke Paternal Aunt        great aunt  . Heart attack Neg Hx   . Colon cancer Neg Hx   . Stomach cancer Neg Hx   . Rectal cancer Neg Hx   . Esophageal cancer Neg Hx   . Liver cancer Neg Hx      ROS:  Please see the history of present illness.  All other ROS reviewed and negative.     Physical Exam/Data:   Vitals:   05/18/19 1332 05/18/19 1507  BP: (!) 133/95   Pulse: (!) 116   Resp: 20   Temp: 97.6 F (36.4 C)   TempSrc: Oral   SpO2: 100% 100%   No intake or output data in the 24 hours ending 05/18/19 1544 Last 3 Weights 04/29/2019 04/12/2019 04/01/2019  Weight (lbs) 138 lb 143 lb 149 lb 1 oz  Weight (kg) 62.596 kg 64.864 kg 67.614 kg     There is no height or weight on file to calculate BMI.  General:  AAF, appears acutely ill HEENT: normal Lymph: no adenopathy Neck: +JVD Endocrine:  No thryomegaly Vascular: No carotid bruits Cardiac:  normal S1, S2; RRR; no murmur  Lungs:  Diffusely diminished breath sounds, no wheezing, rhonchi or rales; 2-3L O2 Abd: soft, nontender Ext: no edema Musculoskeletal:  No deformities, BUE and BLE strength normal and equal Skin:  cold Neuro: Somnolent, oriented x 2 Psych:  Normal affect   EKG:  The EKG was personally reviewed and demonstrates:   sinus tach with LVH and some ST depression in V6  Telemetry:  Telemetry was personally reviewed and demonstrates:  Sinus tachycardia HR 100-120, PACs and PVCs  Relevant CV Studies:  Echo 2018 Study Conclusions   - Left ventricle: The cavity size was normal. Wall thickness was  normal. Systolic function was mildly to moderately reduced. The  estimated ejection fraction was 40%. Diffuse hypokinesis. There  was a reduced contribution of atrial  contraction to ventricular  filling, due to increased ventricular diastolic pressure or  atrial contractile dysfunction. Doppler parameters are consistent  with high ventricular filling pressure.  - Aortic valve: There was mild regurgitation.  - Mitral valve: A bioprosthesis was present. with normal function.  Mean gradient (D): 2 mm Hg. Peak gradient (D): 10 mm Hg.  - Left atrium: The atrium was moderately dilated.  - Pulmonary arteries: PA peak pressure: 38 mm Hg (S).    Cardiac Cath 2016  Mid RCA lesion, 20% stenosed.  Ost Cx lesion, 30% stenosed.  Mid Cx lesion, 30% stenosed.  Mid LAD to Dist LAD lesion, 70% stenosed. A drug-eluting stent was placed. The lesion was previously treated with a drug-eluting stent greater than two years ago.  2nd Diag lesion, 30% stenosed.  Ost LAD lesion, 70% stenosed.   1. Moderate single vessel obstructive CAD. There is a 70% ostial stenosis noted in the RAO caudal view that was not noted on prior angiogram. There is a 70% stenosis in the distal portion of the prior LAD stent 2. Low normal LV function- EF approximately 50%. 3. Moderate to severe MR 3+ 4. Normal right heart pressures. Initial filling pressures were remarkably low due to aggressive diuresis.   Recommendation: Surgical evaluation to consider MV repair/replacement and single vessel CABG to reduce risk of recurrent CHF and worsening LV function  Laboratory Data:  High Sensitivity Troponin:   Recent Labs  Lab 05/18/19 1343  TROPONINIHS 407*     Chemistry Recent Labs  Lab 05/18/19 1343  NA 135  K 3.7  CL 99  CO2 17*  GLUCOSE 114*  BUN 31*  CREATININE 2.33*  CALCIUM 9.6  GFRNONAA 21*  GFRAA 24*  ANIONGAP 19*    No results for input(s): PROT, ALBUMIN, AST, ALT, ALKPHOS, BILITOT in the last 168 hours. Hematology Recent Labs  Lab 05/18/19 1343  WBC 9.4  RBC 5.35*  HGB 13.6  HCT 43.0  MCV 80.4  MCH 25.4*  MCHC 31.6  RDW 21.5*  PLT 90*   BNPNo  results for input(s): BNP, PROBNP in the last 168 hours.  DDimer No results for input(s): DDIMER in the last 168 hours.   Radiology/Studies:  DG Chest 2 View  Result Date: 05/18/2019 CLINICAL DATA:  Chest pain.  History of lung cancer with lobectomy EXAM: CHEST - 2 VIEW COMPARISON:  CT chest 03/15/2019.  Chest two-view 06/05/2017 FINDINGS: Median sternotomy with mitral valve replacement. Cardiac enlargement without heart failure. No significant effusion. Negative for pneumonia Lobectomy right upper lobe with surgical clips. Mild prominence of the right hilum however no mass is seen in this area on recent CT. Findings may be postsurgical. There is scarring in the right upper lobe. IMPRESSION: Cardiac enlargement with mitral valve replacement. Negative for heart failure Postsurgical changes the right upper lobe for lobectomy. No acute infiltrate or mass. Electronically Signed   By: Franchot Gallo M.D.  On: 05/18/2019 14:29     Assessment and Plan:   Cardiogenic shock: presents with AMS, AKI, elevated lactate (6.7). Cold and dry on exam.  Stat TTE ordered, shows severe LV/RV systolic dysfunction.  Discussed with Dr Haroldine Laws and will admit to CCU on heart failure service for inotropic support.    For questions or updates, please contact [facility] Please consult [email or web address] for contact info under     Signed, Cadence Arlyss Repress  05/18/2019 3:44 PM   Donato Heinz, MD

## 2019-05-18 NOTE — Progress Notes (Signed)
ANTICOAGULATION CONSULT NOTE - Initial Consult  Pharmacy Consult for Heparin Indication: chest pain/ACS  Allergies  Allergen Reactions  . Lisinopril Swelling    Angioedema 06/10/11  . Chantix [Varenicline] Other (See Comments)    Caused insomnia  . Penicillins Hives    Did it involve swelling of the face/tongue/throat, SOB, or low BP? No Did it involve sudden or severe rash/hives, skin peeling, or any reaction on the inside of your mouth or nose? No Did you need to seek medical attention at a hospital or doctor's office? No When did it last happen?50 Years If all above answers are "NO", may proceed with cephalosporin use.      Patient Measurements: Height: 6\' 1"  (185.4 cm) Weight: 148 lb 9.6 oz (67.4 kg) IBW/kg (Calculated) : 75.4 Heparin Dosing Weight: 67.4 k  Vital Signs: Temp: 97.6 F (36.4 C) (02/23 1332) Temp Source: Oral (02/23 1332) BP: 133/84 (02/23 1845) Pulse Rate: 87 (02/23 1830)  Labs: Recent Labs    05/18/19 1343 05/18/19 1536  HGB 13.6  --   HCT 43.0  --   PLT 90*  --   CREATININE 2.33*  --   TROPONINIHS 407* 387*    Estimated Creatinine Clearance: 25.3 mL/min (A) (by C-G formula based on SCr of 2.33 mg/dL (H)).   Medical History: Past Medical History:  Diagnosis Date  . Anemia   . Bronchogenic lung cancer, right ([place]) 11/29/2016  . CAD (coronary artery disease)    a. s/p LIMA-LAD in 11/2014  . Carotid artery occlusion   . CHF (congestive heart failure) ([place])   . Complication of anesthesia    slow to awaken x 1 maybe 2001  . COPD (chronic obstructive pulmonary disease) ([place])   . GERD (gastroesophageal reflux disease)    "sometimes" takes Copywriter, advertising or drinks gingerale   . Headache(784.0)   . History of kidney stones   . Hyperlipidemia   . Hypertension   . Leg pain   . Peripheral vascular disease ([place])   . Renal vascular disease 10/26/2014   bilateral stents placed   . Severe mitral regurgitation    a. s/p MVR in 11/2014 with a  pericardial tissue valve  . Shortness of breath dyspnea   . Tobacco abuse     Medications:  Scheduled:  . aspirin EC  81 mg Oral Daily  . atorvastatin  80 mg Oral Daily  . clopidogrel  75 mg Oral Daily  . ezetimibe  10 mg Oral Daily  . fluticasone  1 puff Inhalation Daily  . gabapentin  200 mg Oral TID  . heparin  4,000 Units Intravenous Once  . metoprolol succinate  25 mg Oral Daily  . sodium chloride flush  3 mL Intravenous Once    Assessment: Patient is a 81 yof that is being admitted for AMS. The patient has a Hx of CAD, HTN, HF, PVD, and MV replacement. The patient does not appear to be on any anticoagulation at home. Pharmacy has been asked to dose heparin for ACS at this time.   Goal of Therapy:  Heparin level 0.3-0.7 units/ml Monitor platelets by anticoagulation protocol: Yes   Plan:  - Heparin bolus of 4000 units IV x 1 dose  - Followed by Heparin Drip @ 800units/hr  - Heparin level in 6 hours  - Platelets today are 90 significantly lower than baseline, monitor closely. - Monitor patient for s/s of bleeding and cbc while on heparin  Duanne Limerick PharmD. BCPS  05/18/2019,7:14 PM

## 2019-05-18 NOTE — ED Triage Notes (Signed)
Pt arrives to ED with [facility] from home with complaints of substernal chest pain starting yesterday that has worsened today. Patient states the chest pain has not gone away and that she's also had some mild abdominal pain all this month. Per EMS patient family thought she was "off" this morning so they felt like she needed to get evaluated.

## 2019-05-19 ENCOUNTER — Inpatient Hospital Stay (HOSPITAL_COMMUNITY): Payer: Medicare Other

## 2019-05-19 DIAGNOSIS — R570 Cardiogenic shock: Secondary | ICD-10-CM

## 2019-05-19 DIAGNOSIS — E43 Unspecified severe protein-calorie malnutrition: Secondary | ICD-10-CM | POA: Insufficient documentation

## 2019-05-19 DIAGNOSIS — N179 Acute kidney failure, unspecified: Secondary | ICD-10-CM

## 2019-05-19 DIAGNOSIS — R4182 Altered mental status, unspecified: Secondary | ICD-10-CM

## 2019-05-19 LAB — POCT I-STAT 7, (LYTES, BLD GAS, ICA,H+H)
Acid-Base Excess: 3 mmol/L — ABNORMAL HIGH (ref 0.0–2.0)
Bicarbonate: 27.2 mmol/L (ref 20.0–28.0)
Calcium, Ion: 1.08 mmol/L — ABNORMAL LOW (ref 1.15–1.40)
HCT: 40 % (ref 36.0–46.0)
Hemoglobin: 13.6 g/dL (ref 12.0–15.0)
O2 Saturation: 97 %
Patient temperature: 37.1
Potassium: 3.9 mmol/L (ref 3.5–5.1)
Sodium: 140 mmol/L (ref 135–145)
TCO2: 28 mmol/L (ref 22–32)
pCO2 arterial: 38.3 mmHg (ref 32.0–48.0)
pH, Arterial: 7.46 — ABNORMAL HIGH (ref 7.350–7.450)
pO2, Arterial: 85 mmHg (ref 83.0–108.0)

## 2019-05-19 LAB — POCT I-STAT, CHEM 8
BUN: 32 mg/dL — ABNORMAL HIGH (ref 8–23)
Calcium, Ion: 1.07 mmol/L — ABNORMAL LOW (ref 1.15–1.40)
Chloride: 100 mmol/L (ref 98–111)
Creatinine, Ser: 1.9 mg/dL — ABNORMAL HIGH (ref 0.44–1.00)
Glucose, Bld: 174 mg/dL — ABNORMAL HIGH (ref 70–99)
HCT: 42 % (ref 36.0–46.0)
Hemoglobin: 14.3 g/dL (ref 12.0–15.0)
Potassium: 2.8 mmol/L — ABNORMAL LOW (ref 3.5–5.1)
Sodium: 138 mmol/L (ref 135–145)
TCO2: 23 mmol/L (ref 22–32)

## 2019-05-19 LAB — CBC
HCT: 36.7 % (ref 36.0–46.0)
Hemoglobin: 12.1 g/dL (ref 12.0–15.0)
MCH: 25.4 pg — ABNORMAL LOW (ref 26.0–34.0)
MCHC: 33 g/dL (ref 30.0–36.0)
MCV: 77.1 fL — ABNORMAL LOW (ref 80.0–100.0)
Platelets: 73 10*3/uL — ABNORMAL LOW (ref 150–400)
RBC: 4.76 MIL/uL (ref 3.87–5.11)
RDW: 20.6 % — ABNORMAL HIGH (ref 11.5–15.5)
WBC: 10.8 10*3/uL — ABNORMAL HIGH (ref 4.0–10.5)
nRBC: 1.2 % — ABNORMAL HIGH (ref 0.0–0.2)

## 2019-05-19 LAB — COMPREHENSIVE METABOLIC PANEL
ALT: 24 U/L (ref 0–44)
AST: 31 U/L (ref 15–41)
Albumin: 2.9 g/dL — ABNORMAL LOW (ref 3.5–5.0)
Alkaline Phosphatase: 75 U/L (ref 38–126)
Anion gap: 14 (ref 5–15)
BUN: 35 mg/dL — ABNORMAL HIGH (ref 8–23)
CO2: 24 mmol/L (ref 22–32)
Calcium: 8.4 mg/dL — ABNORMAL LOW (ref 8.9–10.3)
Chloride: 101 mmol/L (ref 98–111)
Creatinine, Ser: 2.17 mg/dL — ABNORMAL HIGH (ref 0.44–1.00)
GFR calc Af Amer: 27 mL/min — ABNORMAL LOW (ref >60)
GFR calc non Af Amer: 23 mL/min — ABNORMAL LOW (ref >60)
Glucose, Bld: 182 mg/dL — ABNORMAL HIGH (ref 70–99)
Potassium: 2.8 mmol/L — ABNORMAL LOW (ref 3.5–5.1)
Sodium: 139 mmol/L (ref 135–145)
Total Bilirubin: 3.5 mg/dL — ABNORMAL HIGH (ref 0.3–1.2)
Total Protein: 5.2 g/dL — ABNORMAL LOW (ref 6.5–8.1)

## 2019-05-19 LAB — LACTIC ACID, PLASMA
Lactic Acid, Venous: 1.2 mmol/L (ref 0.5–1.9)
Lactic Acid, Venous: 1.5 mmol/L (ref 0.5–1.9)
Lactic Acid, Venous: 5 mmol/L (ref 0.5–1.9)

## 2019-05-19 LAB — BASIC METABOLIC PANEL
Anion gap: 10 (ref 5–15)
BUN: 34 mg/dL — ABNORMAL HIGH (ref 8–23)
CO2: 26 mmol/L (ref 22–32)
Calcium: 8.6 mg/dL — ABNORMAL LOW (ref 8.9–10.3)
Chloride: 100 mmol/L (ref 98–111)
Creatinine, Ser: 1.86 mg/dL — ABNORMAL HIGH (ref 0.44–1.00)
GFR calc Af Amer: 32 mL/min — ABNORMAL LOW (ref >60)
GFR calc non Af Amer: 28 mL/min — ABNORMAL LOW (ref >60)
Glucose, Bld: 105 mg/dL — ABNORMAL HIGH (ref 70–99)
Potassium: 3.4 mmol/L — ABNORMAL LOW (ref 3.5–5.1)
Sodium: 136 mmol/L (ref 135–145)

## 2019-05-19 LAB — POTASSIUM: Potassium: 2.9 mmol/L — ABNORMAL LOW (ref 3.5–5.1)

## 2019-05-19 LAB — COOXEMETRY PANEL
Carboxyhemoglobin: 1.4 % (ref 0.5–1.5)
Methemoglobin: 1.1 % (ref 0.0–1.5)
O2 Saturation: 65.4 %
Total hemoglobin: 11.9 g/dL — ABNORMAL LOW (ref 12.0–16.0)

## 2019-05-19 LAB — GLUCOSE, CAPILLARY: Glucose-Capillary: 124 mg/dL — ABNORMAL HIGH (ref 70–99)

## 2019-05-19 LAB — PROTIME-INR
INR: 1.8 — ABNORMAL HIGH (ref 0.8–1.2)
Prothrombin Time: 20.3 seconds — ABNORMAL HIGH (ref 11.4–15.2)

## 2019-05-19 LAB — MAGNESIUM: Magnesium: 1.5 mg/dL — ABNORMAL LOW (ref 1.7–2.4)

## 2019-05-19 LAB — URINE CULTURE

## 2019-05-19 LAB — MRSA PCR SCREENING: MRSA by PCR: NEGATIVE

## 2019-05-19 MED ORDER — ADULT MULTIVITAMIN W/MINERALS CH
1.0000 | ORAL_TABLET | Freq: Every day | ORAL | Status: DC
  Administered 2019-05-20 – 2019-06-03 (×14): 1 via ORAL
  Filled 2019-05-19 (×15): qty 1

## 2019-05-19 MED ORDER — MAGNESIUM SULFATE 4 GM/100ML IV SOLN
4.0000 g | Freq: Once | INTRAVENOUS | Status: DC
  Filled 2019-05-19: qty 100

## 2019-05-19 MED ORDER — SODIUM CHLORIDE 0.9% FLUSH: 10.0000 mL | INTRAVENOUS | Status: DC | PRN

## 2019-05-19 MED ORDER — FUROSEMIDE 10 MG/ML IJ SOLN
80.0000 mg | Freq: Once | INTRAMUSCULAR | Status: AC
  Administered 2019-05-19: 08:00:00 80 mg via INTRAVENOUS
  Filled 2019-05-19: qty 8

## 2019-05-19 MED ORDER — POTASSIUM CHLORIDE CRYS ER 20 MEQ PO TBCR: 20.0000 meq | EXTENDED_RELEASE_TABLET | Freq: Once | ORAL | Status: DC

## 2019-05-19 MED ORDER — POTASSIUM CHLORIDE 10 MEQ/50ML IV SOLN
10.0000 meq | INTRAVENOUS | Status: AC
  Administered 2019-05-19 (×4): 10 meq via INTRAVENOUS
  Filled 2019-05-19 (×5): qty 50

## 2019-05-19 MED ORDER — SODIUM CHLORIDE 0.9% FLUSH
10.0000 mL | Freq: Two times a day (BID) | INTRAVENOUS | Status: DC
  Administered 2019-05-19 – 2019-05-21 (×2): 10 mL

## 2019-05-19 MED ORDER — POTASSIUM CHLORIDE 10 MEQ/50ML IV SOLN
10.0000 meq | INTRAVENOUS | Status: AC
  Administered 2019-05-19 (×4): 10 meq via INTRAVENOUS
  Filled 2019-05-19 (×4): qty 50

## 2019-05-19 MED ORDER — CHLORHEXIDINE GLUCONATE CLOTH 2 % EX PADS
6.0000 | MEDICATED_PAD | Freq: Every day | CUTANEOUS | Status: DC
  Administered 2019-05-19 – 2019-06-03 (×14): 6 via TOPICAL

## 2019-05-19 MED ORDER — ENSURE ENLIVE PO LIQD
237.0000 mL | Freq: Three times a day (TID) | ORAL | Status: DC
  Administered 2019-05-20 – 2019-06-02 (×29): 237 mL via ORAL

## 2019-05-19 MED ORDER — COSYNTROPIN 0.25 MG IJ SOLR
0.2500 mg | Freq: Once | INTRAMUSCULAR | Status: DC
  Filled 2019-05-19: qty 0.25

## 2019-05-19 MED ORDER — ASPIRIN 81 MG PO CHEW
81.0000 mg | CHEWABLE_TABLET | Freq: Every day | ORAL | Status: DC
  Administered 2019-05-20 – 2019-06-03 (×15): 81 mg via ORAL
  Filled 2019-05-19 (×15): qty 1

## 2019-05-19 NOTE — Evaluation (Signed)
Clinical/Bedside Swallow Evaluation Patient Details  Name: Leah [name] MRN: [id number] Date of Birth: 01-16-1951  Today's Date: 05/19/2019 Time: SLP Start Time (ACUTE ONLY): 7035 SLP Stop Time (ACUTE ONLY): 1313 SLP Time Calculation (min) (ACUTE ONLY): 15 min  Past Medical History:  Past Medical History:  Diagnosis Date  . Anemia   . Bronchogenic lung cancer, right ([place]) 11/29/2016  . CAD (coronary artery disease)    a. s/p LIMA-LAD in 11/2014  . Carotid artery occlusion   . CHF (congestive heart failure) ([place])   . Complication of anesthesia    slow to awaken x 1 maybe 2001  . COPD (chronic obstructive pulmonary disease) ([place])   . GERD (gastroesophageal reflux disease)    "sometimes" takes Copywriter, advertising or drinks gingerale   . Headache(784.0)   . History of kidney stones   . Hyperlipidemia   . Hypertension   . Leg pain   . Peripheral vascular disease ([place])   . Renal vascular disease 10/26/2014   bilateral stents placed   . Severe mitral regurgitation    a. s/p MVR in 11/2014 with a pericardial tissue valve  . Shortness of breath dyspnea   . Tobacco abuse    Past Surgical History:  Past Surgical History:  Procedure Laterality Date  . ABDOMINAL AORTOGRAM W/LOWER EXTREMITY Left 09/16/2018   Procedure: ABDOMINAL AORTOGRAM W/LOWER EXTREMITY;  Surgeon: Marty Heck, MD;  Location: [facility];  Service: Cardiovascular;  Laterality: Left;  . ABDOMINAL HYSTERECTOMY    . ANGIOPLASTY / STENTING ILIAC  2010   right external iliac by Dr. Irish Lack  . CARDIAC CATHETERIZATION  11/13/11   Left Heart Cath. with Coronary Angiogram  . CARDIAC CATHETERIZATION N/A 08/24/2014   Procedure: Left Heart Cath and Coronary Angiography;  Surgeon: Wellington Hampshire, MD;  Location: [facility];  Service: Cardiovascular;  Laterality: N/A;  . CARDIAC CATHETERIZATION N/A 10/26/2014   Procedure: Right/Left Heart Cath and Coronary Angiography;  Surgeon: Peter M Martinique, MD; oLAD 70%, mLAD  70% ISR, D2 30%, OFC 30%, RCA 20%, EF nl, low R heart pressures after diuresis, severe MR  . CAROTID ENDARTERECTOMY  11/21/2007   left  . COLONOSCOPY W/ POLYPECTOMY    . CORONARY ANGIOPLASTY WITH STENT PLACEMENT  6/09   LAD 2.5x12 [place]  . CORONARY ARTERY BYPASS GRAFT N/A 12/05/2014   Procedure: CORONARY ARTERY BYPASS GRAFTING (CABG);  Surgeon: [name], MD;  Location: [facility];  Service: Open Heart Surgery;  Laterality: N/A;  Times 1 using left internal mammary artery to LAD  . FEMORAL-POPLITEAL BYPASS GRAFT  10/12   left Dr. Kellie Simmering  . LOWER EXTREMITY ANGIOGRAPHY N/A 02/04/2018   Procedure: LOWER EXTREMITY ANGIOGRAPHY;  Surgeon: Marty Heck, MD;  Location: [facility];  Service: Cardiovascular;  Laterality: N/A;  . MITRAL VALVE REPAIR  2016  . MITRAL VALVE REPLACEMENT N/A 12/05/2014   Procedure: MITRAL VALVE (MV) REPLACEMENT;  Surgeon: [name], MD;  Location: [facility];  Service: Open Heart Surgery;  Laterality: N/A;  Closure left atrial appendage  . PERIPHERAL VASCULAR BALLOON ANGIOPLASTY Bilateral 09/16/2018   Procedure: PERIPHERAL VASCULAR BALLOON ANGIOPLASTY;  Surgeon: Marty Heck, MD;  Location: [facility];  Service: Cardiovascular;  Laterality: Bilateral;  bilateral renal arteries, Left external iliac  . PERIPHERAL VASCULAR INTERVENTION Left 02/04/2018   Procedure: PERIPHERAL VASCULAR INTERVENTION;  Surgeon: Marty Heck, MD;  Location: [facility];  Service: Cardiovascular;  Laterality: Left;  external iliac  .  RENAL ANGIOGRAPHY Bilateral 09/16/2018   Procedure: RENAL ANGIOGRAPHY;  Surgeon: Marty Heck, MD;  Location: [facility];  Service: Cardiovascular;  Laterality: Bilateral;  . RENAL ARTERY STENT Bilateral   . TEE WITHOUT CARDIOVERSION N/A 10/24/2014   Procedure: TRANSESOPHAGEAL ECHOCARDIOGRAM (TEE);  Surgeon: Thayer Headings, MD;  Location: [facility];  Service: Cardiovascular;  Laterality: N/A;  . TEE  WITHOUT CARDIOVERSION N/A 12/05/2014   Procedure: TRANSESOPHAGEAL ECHOCARDIOGRAM (TEE);  Surgeon: [name], MD;  Location: [facility];  Service: Open Heart Surgery;  Laterality: N/A;  . VIDEO ASSISTED THORACOSCOPY (VATS)/WEDGE RESECTION Right 11/29/2016   Procedure: VIDEO ASSISTED THORACOSCOPY (VATS)/ RUL WEDGE RESECTION OF LESION/ NODE SAMPLING;  Surgeon: [name], MD;  Location: [facility];  Service: Thoracic;  Laterality: Right;  Marland Kitchen VIDEO BRONCHOSCOPY N/A 11/29/2016   Procedure: VIDEO BRONCHOSCOPY;  Surgeon: [name], MD;  Location: [facility];  Service: Thoracic;  Laterality: N/A;   HPI:  Pt is a 69 y.o. female who presented with altered mental status. PMH is significant for CAD, HTN, HLD, PVD, HFpEF, CKD, MV replacement, Hx bronchogenic lung cancer, and tobacco use. Chest x-ray 2/24: Mild congestive heart failure, slightly worsened.    Assessment / Plan / Recommendation Clinical Impression  Pt was seen for bedside swallow evaluation and she denied a history of oropharyngeal dysphagia. She stated that she has difficulty with mastication of regular texture solids due to the condition of her dentures. Oral mechanism exam was limited due to pt's difficulty following some commands; however, oral motor strength and ROM appeared grossly WFL. She was edentulous and per RN, pt's son will be bringing her dentures to the hospital. Mastication time was increased with dysphagia 3 solids secondary to edentulous status. She tolerated all solids and liquids without s/sx of aspiration or other pharyngeal symptoms. SLP will follow to ensure diet tolerance and to determine her ability to safely tolerate a more advanced diet.  SLP Visit Diagnosis: Dysphagia, unspecified (R13.10)    Aspiration Risk  Mild aspiration risk    Diet Recommendation Dysphagia 1 (Puree);Thin liquid   Liquid Administration via: Cup;Straw Medication Administration: Crushed with puree Supervision: Staff to assist with self  feeding Compensations: Slow rate;Small sips/bites Postural Changes: Seated upright at 90 degrees;Remain upright for at least 30 minutes after po intake    Other  Recommendations Oral Care Recommendations: Oral care BID   Follow up Recommendations None      Frequency and Duration min 2x/week  2 weeks       Prognosis Prognosis for Safe Diet Advancement: Good      Swallow Study   General Date of Onset: 05/18/19 HPI: Pt is a 69 y.o. female who presented with altered mental status. PMH is significant for CAD, HTN, HLD, PVD, HFpEF, CKD, MV replacement, Hx bronchogenic lung cancer, and tobacco use. Chest x-ray 2/24: Mild congestive heart failure, slightly worsened.  Type of Study: Bedside Swallow Evaluation Previous Swallow Assessment: None Diet Prior to this Study: NPO;Dysphagia 3 (soft);Thin liquids(P.o. diet has been held) Temperature Spikes Noted: No Respiratory Status: Nasal cannula History of Recent Intubation: No Behavior/Cognition: Alert;Cooperative Oral Cavity Assessment: Within Functional Limits Oral Care Completed by SLP: No Oral Cavity - Dentition: Edentulous Vision: Functional for self-feeding Self-Feeding Abilities: Able to feed self Patient Positioning: Upright in bed;Postural control adequate for testing Baseline Vocal Quality: Normal Volitional Swallow: Unable to elicit    Oral/Motor/Sensory Function Overall Oral Motor/Sensory Function: Within functional limits   Ice Chips Ice chips: Within functional limits  Presentation: Spoon   Thin Liquid Thin Liquid: Within functional limits Presentation: Cup;Straw    Nectar Thick Nectar Thick Liquid: Not tested   Honey Thick Honey Thick Liquid: Not tested   Puree Puree: Within functional limits Presentation: Spoon   Solid     Solid: Impaired Presentation: Spoon Oral Phase Impairments: Impaired mastication     Mariusz Jubb I. Hardin Negus, [place], [place] Office number [phone] Pager  [phone]  Horton Marshall 05/19/2019,2:43 PM

## 2019-05-19 NOTE — Progress Notes (Addendum)
Entered in error. See AHF progress note from Dr. Haroldine Laws

## 2019-05-19 NOTE — Progress Notes (Signed)
Initial Nutrition Assessment  DOCUMENTATION CODES:   Severe malnutrition in context of chronic illness  INTERVENTION:   Ensure Enlive po TID, each supplement provides 350 kcal and 20 grams of protein  Recommend downgrading to Dysphagia III (easy to chew/mechanical soft) diet as pt without teeth and dentures are at home; further recommendations regarding diet consistency to follow from SLP  MVI with [place]  NUTRITION DIAGNOSIS:   Severe Malnutrition related to chronic illness as evidenced by severe fat depletion, severe muscle depletion.  GOAL:   Patient will meet greater than or equal to 90% of their needs  MONITOR:   PO intake, Supplement acceptance, Labs, Weight trends  REASON FOR ASSESSMENT:   Consult Diet education  ASSESSMENT:   69 yo female admitted with hypothermia and profound cardiogenic shock with severe biventricular dysfuction, ECHO in ER with EF 10%, AKI, AMS. PMH includes CAD s/p CABG with LIMA to LAD and MVR, lung cancer s/p resection, severe PAD   Currently on milrinone  2/23 Swan placed  SLP consulted but unable to assess as pt has been obtunded. No recorded po intake, pt not alert enough for breakfast this AM. On assessment later however, pt arouseable and talking. Pt able to answer questions, asking for water.   Pt reports she has not been eating well at home lately and has experienced weight loss.  Pt reports she does like Ensure/Boost, unable to tell writer how much she eats  Pt reports UBW around 180 pounds; reports height of 6 feet 1 inches tall. Current weight 65.7 kg (145 pounds). No recent weight loss per weight encounters; Noted weight of 72 kg (158 pounds)  mid-2020. Unsure when pt last weight 180 pounds.   Pt is edentulous; does have dentures but left at home. Possible that son may be able to bring in for pt.   Pt not appropriate for diet education today regarding heart failure; RD to follow and provide verbal education as able  Labs:  potassium 3.9 (wdl), Creatinine 2.17, BUN 35 Meds: reviewed  NUTRITION - FOCUSED PHYSICAL EXAM:    Most Recent Value  Orbital Region  Moderate depletion  Upper Arm Region  Severe depletion  Thoracic and Lumbar Region  Severe depletion  Buccal Region  Moderate depletion  Temple Region  Severe depletion  Clavicle Bone Region  Moderate depletion  Clavicle and Acromion Bone Region  Severe depletion  Scapular Bone Region  Severe depletion  Dorsal Hand  Mild depletion  Patellar Region  Severe depletion  Anterior Thigh Region  Severe depletion  Posterior Calf Region  Severe depletion  Edema (RD Assessment)  None       Diet Order:   Diet Order            Diet 2 gram sodium Room service appropriate? Yes; Fluid consistency: Thin  Diet effective now              EDUCATION NEEDS:   Not appropriate for education at this time  Skin:  Skin Assessment: Reviewed RN Assessment  Last BM:  no documented BM  Height:   Ht Readings from Last 1 Encounters:  05/18/19 6\' 1"  (1.854 m)    Weight:   Wt Readings from Last 1 Encounters:  05/19/19 65.7 kg    BMI:  Body mass index is 19.11 kg/m.  Estimated Nutritional Needs:   Kcal:  8657-8469 kcals  Protein:  100-115 g  Fluid:  >/= 2 L    Kerman Passey MS, RDN, LDN, CNSC RD Pager Number  and Weekend/On-Call After Hours Pager Located in [place]

## 2019-05-19 NOTE — Progress Notes (Signed)
Advanced Heart Failure Rounding Note   Subjective:    Admitted overnight with hypothermia and profound cardiogenic shock.   Now on milrinone 0.4  More alert but still lethargic. Lactic acid has cleared (1.5).  Co-ox 65%  Denies CP or SOB. Renal function coming down.   ABG 7.46/38/85/97%  Swan #s CVP 9 PA 45/38 (40) PCWP 37  Thermo 2.1/1.1 Co-ox 65%   Objective:   Weight Range:  Vital Signs:   Temp:  [96.4 F (35.8 C)-98.8 F (37.1 C)] 97.2 F (36.2 C) (02/24 0853) Pulse Rate:  [38-121] 87 (02/23 2130) Resp:  [9-27] 21 (02/24 0853) BP: (106-168)/(68-99) 113/74 (02/24 0600) SpO2:  [68 %-100 %] 100 % (02/24 0752) Arterial Line BP: (93-150)/(53-103) 103/57 (02/24 0853) Weight:  [65.7 kg-67.4 kg] 65.7 kg (02/24 0500)    Weight change: Filed Weights   05/18/19 1845 05/19/19 0500  Weight: 67.4 kg 65.7 kg    Intake/Output:   Intake/Output Summary (Last 24 hours) at 05/19/2019 0857 Last data filed at 05/19/2019 3546 Gross per 24 hour  Intake 1506.79 ml  Output 590 ml  Net 916.79 ml     Physical Exam: General:  Cachetic Weak. lethargic HEENT: normal Neck: supple. RIJ swan. Carotids 2+ bilat; no bruits. No lymphadenopathy or thryomegaly appreciated. Cor: PMI nondisplaced. Regular rate & rhythm. 2/6 SEm +s3 Lungs: clear Abdomen: soft, nontender, nondistended. No hepatosplenomegaly. No bruits or masses. Good bowel sounds. Extremities: no cyanosis, clubbing, rash, edema Neuro: alert & orientedx3, cranial nerves grossly intact. moves all 4 extremities w/o difficulty. Affect pleasant  Telemetry: NSR 80-90 Personally reviewed   Labs: Basic Metabolic Panel: Recent Labs  Lab 05/18/19 1343 05/18/19 1343 05/18/19 2125 05/18/19 2126 05/18/19 2133 05/18/19 2133 05/18/19 2320 05/19/19 0103 05/19/19 0204 05/19/19 0331 05/19/19 0547  NA 135   < >  --   --  136  --  139 138 139  --  140  K 3.7   < >  --   --  3.6   < > 2.9* 2.8* 2.8* 2.9* 3.9  CL 99  --    --   --   --   --   --  100 101  --   --   CO2 17*  --   --   --   --   --   --   --  24  --   --   GLUCOSE 114*  --   --   --   --   --   --  174* 182*  --   --   BUN 31*  --   --   --   --   --   --  32* 35*  --   --   CREATININE 2.33*  --   --  2.23*  --   --   --  1.90* 2.17*  --   --   CALCIUM 9.6  --   --   --   --   --   --   --  8.4*  --   --   MG  --   --  1.8  --   --   --   --   --   --   --   --    < > = values in this interval not displayed.    Liver Function Tests: Recent Labs  Lab 05/19/19 0204  AST 31  ALT 24  ALKPHOS 75  BILITOT 3.5*  PROT  5.2*  ALBUMIN 2.9*   No results for input(s): LIPASE, AMYLASE in the last 168 hours. Recent Labs  Lab 05/18/19 1544  AMMONIA 18    CBC: Recent Labs  Lab 05/18/19 1343 05/18/19 1343 05/18/19 2126 05/18/19 2126 05/18/19 2133 05/18/19 2320 05/19/19 0103 05/19/19 0204 05/19/19 0547  WBC 9.4  --  9.3  --   --   --   --  10.8*  --   HGB 13.6   < > 12.7   < > 15.0 14.3 14.3 12.1 13.6  HCT 43.0   < > 38.1   < > 44.0 42.0 42.0 36.7 40.0  MCV 80.4  --  77.8*  --   --   --   --  77.1*  --   PLT 90*  --  74*  --   --   --   --  73*  --    < > = values in this interval not displayed.    Cardiac Enzymes: No results for input(s): CKTOTAL, CKMB, CKMBINDEX, TROPONINI in the last 168 hours.  BNP: BNP (last 3 results) Recent Labs    05/18/19 1707  BNP >4,500.0*    ProBNP (last 3 results) No results for input(s): PROBNP in the last 8760 hours.    Other results:  Imaging: CT ABDOMEN PELVIS WO CONTRAST  Result Date: 05/18/2019 CLINICAL DATA:  Shortness of breath abdominal and back pain EXAM: CT CHEST, abdomen, and pelvis WITHOUT CONTRAST TECHNIQUE: Multidetector CT imaging of the chest, abdomen, and pelvis was performed following the standard protocol without IV contrast. COMPARISON:  March 15, 2019 FINDINGS: Cardiovascular: Again noted is moderate cardiomegaly. Prosthetic mitral valve is seen. Coronary artery  calcifications are noted. There is a tortuous descending intrathoracic aorta with a focal area of ectasia, measuring up to 4 cm, not significantly changed since the prior exam. Intrathoracic aortic calcifications are noted. Mediastinum/Nodes: There are no enlarged mediastinal, hilar or axillary lymph nodes. The thyroid gland, trachea and esophagus demonstrate no significant findings. Lungs/Pleura: Centrilobular emphysematous changes are seen at both lung apices. There is been a prior partial right upper lobectomy with chain suture. No large airspace consolidation or pleural effusion. Upper abdomen: The visualized portion of the upper abdomen is unremarkable. Musculoskeletal/Chest wall: There is no chest wall mass or suspicious osseous finding. No acute osseous abnormality overlying median sternotomy wires. Abdomen/pelvis: Hepatobiliary: Although limited due to the lack of intravenous contrast, normal in appearance without gross focal abnormality. No evidence of calcified gallstones or biliary ductal dilatation. Pancreas:  Unremarkable.  No surrounding inflammatory changes. Spleen: Normal in size. Although limited due to the lack of intravenous contrast, normal in appearance. Adrenals/Urinary Tract: Both adrenal glands appear normal. The kidneys and collecting system appear normal without evidence of urinary tract calculus or hydronephrosis. Bladder is unremarkable. Stomach/Bowel: The stomach, small bowel, are normal in appearance. There is scattered colonic diverticula. Mildly prominent air-filled rectum with stool is seen. No focal inflammatory changes. Fall Vascular/Lymphatic: There are no enlarged abdominal or pelvic lymph nodes. Bilateral renal arterial stents are seen. There are dense vascular calcifications seen throughout. Reproductive: The patient is status post hysterectomy. No adnexal masses or collections seen. Other: No evidence of abdominal wall mass or hernia. Musculoskeletal: No acute or significant  osseous findings. IMPRESSION: 1. Moderate cardiomegaly as on prior exam. 2. No significant change in the focal ectasia of the intrathoracic descending aorta measuring up to 4 cm. This is unchanged dating back to 2019. 3. Centrilobular emphysematous changes and postsurgical changes  of a partial right upper lobectomy. 4. Diverticulosis without diverticulitis. 5.  Aortic Atherosclerosis (ICD10-I70.0). Electronically Signed   By: Prudencio Pair M.D.   On: 05/18/2019 17:50   DG Chest 2 View  Result Date: 05/18/2019 CLINICAL DATA:  Chest pain.  History of lung cancer with lobectomy EXAM: CHEST - 2 VIEW COMPARISON:  CT chest 03/15/2019.  Chest two-view 06/05/2017 FINDINGS: Median sternotomy with mitral valve replacement. Cardiac enlargement without heart failure. No significant effusion. Negative for pneumonia Lobectomy right upper lobe with surgical clips. Mild prominence of the right hilum however no mass is seen in this area on recent CT. Findings may be postsurgical. There is scarring in the right upper lobe. IMPRESSION: Cardiac enlargement with mitral valve replacement. Negative for heart failure Postsurgical changes the right upper lobe for lobectomy. No acute infiltrate or mass. Electronically Signed   By: Franchot Gallo M.D.   On: 05/18/2019 14:29   CT Head Wo Contrast  Result Date: 05/18/2019 CLINICAL DATA:  Encephalopathy EXAM: CT HEAD WITHOUT CONTRAST TECHNIQUE: Contiguous axial images were obtained from the base of the skull through the vertex without intravenous contrast. COMPARISON:  November 28, 2014 FINDINGS: Brain: No evidence of acute territorial infarction, hemorrhage, hydrocephalus,extra-axial collection or mass lesion/mass effect. There is dilatation the ventricles and sulci consistent with age-related atrophy. Low-attenuation changes in the deep white matter consistent with small vessel ischemia. Vascular: No hyperdense vessel or unexpected calcification. Skull: The skull is intact. No fracture  or focal lesion identified. Sinuses/Orbits: The visualized paranasal sinuses and mastoid air cells are clear. The orbits and globes intact. Other: None IMPRESSION: No acute intracranial abnormality. Findings consistent with age related atrophy and chronic small vessel ischemia Electronically Signed   By: Prudencio Pair M.D.   On: 05/18/2019 16:55   CT Chest Wo Contrast  Result Date: 05/18/2019 CLINICAL DATA:  Shortness of breath abdominal and back pain EXAM: CT CHEST, abdomen, and pelvis WITHOUT CONTRAST TECHNIQUE: Multidetector CT imaging of the chest, abdomen, and pelvis was performed following the standard protocol without IV contrast. COMPARISON:  March 15, 2019 FINDINGS: Cardiovascular: Again noted is moderate cardiomegaly. Prosthetic mitral valve is seen. Coronary artery calcifications are noted. There is a tortuous descending intrathoracic aorta with a focal area of ectasia, measuring up to 4 cm, not significantly changed since the prior exam. Intrathoracic aortic calcifications are noted. Mediastinum/Nodes: There are no enlarged mediastinal, hilar or axillary lymph nodes. The thyroid gland, trachea and esophagus demonstrate no significant findings. Lungs/Pleura: Centrilobular emphysematous changes are seen at both lung apices. There is been a prior partial right upper lobectomy with chain suture. No large airspace consolidation or pleural effusion. Upper abdomen: The visualized portion of the upper abdomen is unremarkable. Musculoskeletal/Chest wall: There is no chest wall mass or suspicious osseous finding. No acute osseous abnormality overlying median sternotomy wires. Abdomen/pelvis: Hepatobiliary: Although limited due to the lack of intravenous contrast, normal in appearance without gross focal abnormality. No evidence of calcified gallstones or biliary ductal dilatation. Pancreas:  Unremarkable.  No surrounding inflammatory changes. Spleen: Normal in size. Although limited due to the lack of  intravenous contrast, normal in appearance. Adrenals/Urinary Tract: Both adrenal glands appear normal. The kidneys and collecting system appear normal without evidence of urinary tract calculus or hydronephrosis. Bladder is unremarkable. Stomach/Bowel: The stomach, small bowel, are normal in appearance. There is scattered colonic diverticula. Mildly prominent air-filled rectum with stool is seen. No focal inflammatory changes. Fall Vascular/Lymphatic: There are no enlarged abdominal or pelvic lymph nodes. Bilateral renal  arterial stents are seen. There are dense vascular calcifications seen throughout. Reproductive: The patient is status post hysterectomy. No adnexal masses or collections seen. Other: No evidence of abdominal wall mass or hernia. Musculoskeletal: No acute or significant osseous findings. IMPRESSION: 1. Moderate cardiomegaly as on prior exam. 2. No significant change in the focal ectasia of the intrathoracic descending aorta measuring up to 4 cm. This is unchanged dating back to 2019. 3. Centrilobular emphysematous changes and postsurgical changes of a partial right upper lobectomy. 4. Diverticulosis without diverticulitis. 5.  Aortic Atherosclerosis (ICD10-I70.0). Electronically Signed   By: Prudencio Pair M.D.   On: 05/18/2019 17:50   DG CHEST PORT 1 VIEW  Result Date: 05/18/2019 CLINICAL DATA:  69 year old female with central line placement. EXAM: PORTABLE CHEST 1 VIEW COMPARISON:  Chest radiograph dated 05/18/2019. FINDINGS: Right IJ Swan-Ganz with tip over the right hilum likely in the central right middle or right lower lobe pulmonary artery. There is cardiomegaly, slightly improved since the prior radiograph. No focal consolidation, pleural effusion, pneumothorax. Right upper lobe surgical sutures. Atherosclerotic calcification of the aorta. No acute osseous pathology. Median sternotomy wires and mechanical heart valve. IMPRESSION: Swan-Ganz with tip over the right hilum. Electronically  Signed   By: Anner Crete M.D.   On: 05/18/2019 21:20   ECHOCARDIOGRAM COMPLETE  Result Date: 05/18/2019    ECHOCARDIOGRAM REPORT   Patient Name:   Leah [name] Date of Exam: 05/18/2019 Medical Rec #:  [id number]          Height:       73.0 in Accession #:    [id number]         Weight:       138.0 lb Date of Birth:  Aug 02, 1950         BSA:          1.838 m Patient Age:    69 years           BP:           131/89 mmHg Patient Gender: F                  HR:           113 bpm. Exam Location:  Inpatient Procedure: 2D Echo STAT ECHO Indications:    chest pain 786.50  History:        Patient has prior history of Echocardiogram examinations, most                 recent 01/13/2017. CHF, CAD; Risk Factors:Hypertension.                  Mitral Valve: bioprosthetic valve valve is present in the mitral                 position.  Sonographer:    Johny Chess Referring Phys: [address] [place]  1. Left ventricular ejection fraction, by estimation, is <20%. The left ventricle has severely decreased function. The left ventricle demonstrates global hypokinesis. The left ventricular internal cavity size was mildly dilated. There is mild left ventricular hypertrophy.  2. Right ventricular systolic function is severely reduced. The right ventricular size is normal. There is moderately elevated pulmonary artery systolic pressure. The estimated right ventricular systolic pressure is 52.8 mmHg.  3. Left atrial size was mildly dilated.  4. The mitral valve has been repaired/replaced. No evidence of mitral valve regurgitation. There is a bioprosthetic valve present in the mitral position. Mean gradient 7 mmHg  5. Tricuspid valve regurgitation is moderate.  6. The aortic valve is tricuspid. Aortic valve regurgitation is moderate to severe. Mild aortic valve sclerosis is present, with no evidence of aortic valve stenosis.  7. The inferior vena cava is dilated in size with <50% respiratory variability,  suggesting right atrial pressure of 15 mmHg. FINDINGS  Left Ventricle: Left ventricular ejection fraction, by estimation, is <20%. The left ventricle has severely decreased function. The left ventricle demonstrates global hypokinesis. The left ventricular internal cavity size was mildly dilated. There is mild left ventricular hypertrophy. Left ventricular diastolic function could not be evaluated. Right Ventricle: The right ventricular size is normal. No increase in right ventricular wall thickness. Right ventricular systolic function is severely reduced. There is moderately elevated pulmonary artery systolic pressure. The tricuspid regurgitant velocity is 3.38 m/s, and with an assumed right atrial pressure of 15 mmHg, the estimated right ventricular systolic pressure is 37.9 mmHg. Left Atrium: Left atrial size was mildly dilated. Right Atrium: Right atrial size was normal in size. Pericardium: Trivial pericardial effusion is present. Mitral Valve: The mitral valve has been repaired/replaced. No evidence of mitral valve regurgitation. There is a bioprosthetic valve present in the mitral position. MV peak gradient, 11.2 mmHg. The mean mitral valve gradient is 6.3 mmHg. Tricuspid Valve: The tricuspid valve is normal in structure. Tricuspid valve regurgitation is moderate. Aortic Valve: The aortic valve is tricuspid. Aortic valve regurgitation is moderate to severe. Aortic regurgitation PHT measures 210 msec. Mild aortic valve sclerosis is present, with no evidence of aortic valve stenosis. Pulmonic Valve: The pulmonic valve was not well visualized. Pulmonic valve regurgitation is trivial. Aorta: The aortic root and ascending aorta are structurally normal, with no evidence of dilitation. Venous: The inferior vena cava is dilated in size with less than 50% respiratory variability, suggesting right atrial pressure of 15 mmHg. IAS/Shunts: The interatrial septum was not well visualized.  LEFT VENTRICLE PLAX 2D LVIDd:          5.60 cm LVIDs:         5.30 cm LV PW:         1.10 cm LV IVS:        0.90 cm LVOT diam:     1.80 cm LV SV:         20 LV SV Index:   11 LVOT Area:     2.54 cm  RIGHT VENTRICLE TAPSE (M-mode): 0.8 cm LEFT ATRIUM             Index       RIGHT ATRIUM           Index LA diam:        4.10 cm 2.23 cm/m  RA Area:     16.90 cm LA Vol (A2C):   75.6 ml 41.14 ml/m RA Volume:   42.70 ml  23.24 ml/m LA Vol (A4C):   63.2 ml 34.39 ml/m LA Biplane Vol: 72.8 ml 39.61 ml/m  AORTIC VALVE LVOT Vmax:   63.90 cm/s LVOT Vmean:  39.600 cm/s LVOT VTI:    0.080 m AI PHT:      210 msec  AORTA Ao Root diam: 2.60 cm Ao Asc diam:  2.50 cm MITRAL VALVE             TRICUSPID VALVE MV Peak grad: 11.2 mmHg  TR Peak grad:   45.7 mmHg MV Mean grad: 6.3 mmHg   TR Vmax:        338.00 cm/s MV Vmax:  1.67 m/s MV Vmean:     116.3 cm/s SHUNTS                          Systemic VTI:  0.08 m                          Systemic Diam: 1.80 cm Oswaldo Milian MD Electronically signed by Oswaldo Milian MD Signature Date/Time: 05/18/2019/9:03:59 PM    Final    VAS [place] LOWER EXTREMITY VENOUS (DVT) (ONLY [facility] & [facility])  Result Date: 05/18/2019  Lower Venous DVTStudy Indications: Edema.  Limitations: Poor ultrasound/tissue interface and atherosclerosis, patient position and cooperation. Performing Technologist: Antonieta Pert RDMS, RVT  Examination Guidelines: A complete evaluation includes B-mode imaging, spectral Doppler, color Doppler, and power Doppler as needed of all accessible portions of each vessel. Bilateral testing is considered an integral part of a complete examination. Limited examinations for reoccurring indications may be performed as noted. The reflux portion of the exam is performed with the patient in reverse Trendelenburg.  +---------+---------------+---------+-----------+----------+------------------+ RIGHT    CompressibilityPhasicitySpontaneityPropertiesThrombus Aging      +---------+---------------+---------+-----------+----------+------------------+ CFV      Full           Yes      Yes                                     +---------+---------------+---------+-----------+----------+------------------+ SFJ      Full                                                            +---------+---------------+---------+-----------+----------+------------------+ FV Prox  Full                                                            +---------+---------------+---------+-----------+----------+------------------+ FV Mid   Full                                                            +---------+---------------+---------+-----------+----------+------------------+ FV DistalFull                                                            +---------+---------------+---------+-----------+----------+------------------+ PFV      Full                                                            +---------+---------------+---------+-----------+----------+------------------+ POP      Full  Yes      Yes                                     +---------+---------------+---------+-----------+----------+------------------+ PTV      Full                                         poor visualization +---------+---------------+---------+-----------+----------+------------------+ PERO     Full                                         poor visualization +---------+---------------+---------+-----------+----------+------------------+ GSV      Full                                                            +---------+---------------+---------+-----------+----------+------------------+   +---------+---------------+---------+-----------+----------+------------------+ LEFT     CompressibilityPhasicitySpontaneityPropertiesThrombus Aging     +---------+---------------+---------+-----------+----------+------------------+ CFV      Full            Yes      Yes                                     +---------+---------------+---------+-----------+----------+------------------+ SFJ      Full                                                            +---------+---------------+---------+-----------+----------+------------------+ FV Prox  Full                                                            +---------+---------------+---------+-----------+----------+------------------+ FV Mid   Full                                                            +---------+---------------+---------+-----------+----------+------------------+ FV DistalFull                                                            +---------+---------------+---------+-----------+----------+------------------+ PFV      Full                                                            +---------+---------------+---------+-----------+----------+------------------+  POP      Full           Yes      Yes                                     +---------+---------------+---------+-----------+----------+------------------+ PTV      Full                                         poor visualization +---------+---------------+---------+-----------+----------+------------------+ PERO     Full                                         poor visualization +---------+---------------+---------+-----------+----------+------------------+ GSV      Full                                                            +---------+---------------+---------+-----------+----------+------------------+     Summary: RIGHT: - There is no evidence of deep vein thrombosis in the lower extremity. However, portions of this examination were limited- see technologist comments above.  - No cystic structure found in the popliteal fossa.  LEFT: - There is no evidence of deep vein thrombosis in the lower extremity. However, portions of this examination were limited- see technologist  comments above.  - No cystic structure found in the popliteal fossa.  *See table(s) above for measurements and observations. Electronically signed by Servando Snare MD on 05/18/2019 at 6:06:39 PM.    Final       Medications:     Scheduled Medications: . aspirin EC  81 mg Oral Daily  . atorvastatin  80 mg Oral Daily  . Chlorhexidine Gluconate Cloth  6 each Topical Daily  . clopidogrel  75 mg Oral Daily  . enoxaparin (LOVENOX) injection  30 mg Subcutaneous Q24H  . ezetimibe  10 mg Oral Daily  . fluticasone  1 puff Inhalation Daily  . gabapentin  200 mg Oral TID  . metoprolol succinate  25 mg Oral Daily  . sodium chloride flush  10-40 mL Intracatheter Q12H  . sodium chloride flush  3 mL Intravenous Once  . sodium chloride flush  3 mL Intravenous Q12H     Infusions: . sodium chloride Stopped (05/19/19 0736)  . milrinone 0.5 mcg/kg/min (05/19/19 0812)  . norepinephrine (LEVOPHED) Adult infusion Stopped (05/18/19 2352)     PRN Medications:  sodium chloride, acetaminophen, albuterol, ALPRAZolam, cyclobenzaprine, ondansetron (ZOFRAN) IV, polyethylene glycol, sodium chloride flush, sodium chloride flush, zolpidem   Assessment/Plan:    1. Cardiogenic shock with sever biventricular dysfunction - Previous EF 40% in 10/18 - Echo in ER EF 10% with severe RV dysfunction. Moderate to severe AI - admitted with profound CGS. Suspect acute viral myocarditis - Now on milrinone 0.4 - CO still low by thermo but ok by Fick, Volume up - lactate > 6.7 -> 1.5  - Increase mirlinone to 0.5. Diurese - not candidate for mechanical support with PAD and severe AI - Consider eventual coronary angio if improving  2. CAD - s/p CABG with LIMA to LAD and  MVR (bioprosthetic) - Trop 407 -> 387 - doubt ACS - Consider eventual coronary angio if improving  3. Severe AI - consider TEE. If/when she recovers. - follow [place].  - doubt sepsis  4. Lung cancer s/p resection - stable  5. Severe PAD -  s/p left fem-pop and L CEA - bilateral RAS s/p stentig  6. AKI - due to shock  7. AMS - likely due to shock  - improving  8. AMS  likely due to shock - improving slowly - have consulted CCM for help with management  9. Hypokalemia - supp    CRITICAL CARE Performed by: Glori Bickers  Total critical care time: 45 minutes  Critical care time was exclusive of separately billable procedures and treating other patients.  Critical care was necessary to treat or prevent imminent or life-threatening deterioration.  Critical care was time spent personally by me (independent of midlevel providers or residents) on the following activities: development of treatment plan with patient and/or surrogate as well as nursing, discussions with consultants, evaluation of patient's response to treatment, examination of patient, obtaining history from patient or surrogate, ordering and performing treatments and interventions, ordering and review of laboratory studies, ordering and review of radiographic studies, pulse oximetry and re-evaluation of patient's condition.   Length of Stay: 1   Glori Bickers MD 05/19/2019, 8:57 AM  Advanced Heart Failure Team Pager [phone] (M-F; 7a - 4p)  Please contact [facility] for night-coverage after hours (4p -7a ) and weekends on [email or web address]

## 2019-05-19 NOTE — Progress Notes (Addendum)
NAME:  Leah [name], MRN:  [id number], DOB:  Feb 22, 1951, LOS: 1 ADMISSION DATE:  05/18/2019, CHIEF COMPLAINT:  Cardiogenic Shock   Brief History   Leah [name] is a 69 yo F w/ PMHx of CAD s/p CABG and MV replacement in 2016, HTN, HLD, PVD s/p left fem-pop and L CEA and s/p bilateral RAS stenting, HFpEF, CKD, and bronchogenic lung cancer s/p resection who presented on 2/23 with AMS.  Her last known normal was 2/22.  She was found by her son lethargic and largely unresponsive on 2/23 and was subsequently brought to the ER.  She later remembered sitting down b/c she was tired and having chest pain.  Patient was found to be in cardiogenic shock and started on milrinone.  Past Medical History   Past Medical History:  Diagnosis Date  . Anemia   . Bronchogenic lung cancer, right ([place]) 11/29/2016  . CAD (coronary artery disease)    a. s/p LIMA-LAD in 11/2014  . Carotid artery occlusion   . CHF (congestive heart failure) ([place])   . Complication of anesthesia    slow to awaken x 1 maybe 2001  . COPD (chronic obstructive pulmonary disease) ([place])   . GERD (gastroesophageal reflux disease)    "sometimes" takes Copywriter, advertising or drinks gingerale   . Headache(784.0)   . History of kidney stones   . Hyperlipidemia   . Hypertension   . Leg pain   . Peripheral vascular disease ([place])   . Renal vascular disease 10/26/2014   bilateral stents placed   . Severe mitral regurgitation    a. s/p MVR in 11/2014 with a pericardial tissue valve  . Shortness of breath dyspnea   . Tobacco abuse      Significant Hospital Events   2/23 Admission  Consults:  Heart failure  Procedures:    Significant Diagnostic Tests:  2/23 CT head - no acute findings 2/23 CT chest/abd/pelvis - no change in AAA 2/23 CXR - cardiomegaly 2/23 EKG - sinus tachy, R atrial enlargment, L axis deviation, L ventricular hypertrophy, T wave abnormality, consider lateral ischemia, can't rule out septal infarct 2/23 BNP -  >4500  Micro Data:  2/24 MRSA neg 2/23 UA neg for nitrites, leuk est 2/23 neg for covid, influenza A &B  Antimicrobials:  None   Interim history/subjective:  O/N patient was tachycardic, hypothermic.  Patient was very difficult to rouse this morning.  Nurse had to shake and yell at her to get just a brief opening of eyes.  Now sitting up in bed and answering questions.  Objective   Blood pressure (!) 134/96, pulse 87, temperature (!) 96.8 F (36 C), resp. rate (!) 22, height 6\' 1"  (1.854 m), weight 65.7 kg, SpO2 100 %. PAP: (13-83)/(9-62) 64/50 CVP:  [12 mmHg-15 mmHg] 13 mmHg PCWP:  [26 mmHg] 26 mmHg CO:  [3.3 L/min] 3.3 L/min CI:  [1.8 L/min/m2] 1.8 L/min/m2      Intake/Output Summary (Last 24 hours) at 05/19/2019 1138 Last data filed at 05/19/2019 1117 Gross per 24 hour  Intake 1562.44 ml  Output 940 ml  Net 622.44 ml   Filed Weights   05/18/19 1845 05/19/19 0500  Weight: 67.4 kg 65.7 kg    Examination: General: cachetic, frail woman lying in bed HENT: trachea midline, MMM Lungs: CTAB  Cardiovascular: irregular rate and rhythm Abdomen: soft, non-distended Extremities: warm and dry Neuro: extremely difficult to rouse, mostly nonresponsive Skin: no rashes  Resolved Hospital Problem list     Assessment &  Plan:  Cardiogenic Shock 2/2 Severe Ventricular Dysfunction: Echo in ER with EF of 10% (from 40% in 12/2016) and severe RV dysfunction.  Swan in place.  Not a candidate for mechanical support with PAD and severe AI. - continue milrinone - consider eventual coronary angio if improving  Aortic Insuffiency: moderate to severe on Echo in ER - per HF, may consider TEE if patient improving - f/u blood cultures  Dysphagia: Pt reports difficulty swallowing and weight loss (65.7kg on admission from 72.1kg in July 2020). - start with swallow study  AoCKD: Last BMP prior to admission was 1.78 on 2/11.  AKI likely 2/2 shock. - daily BMP  Hypokalemia: -replete  electrolytes  Altered Mental Status: Likely 2/2 severe shock given significant improvement on milrinone.  Head CT on 2/23 showed no acute findings.  No signs of infection (UA neg, CXR shows no signs of consolidation, afebrile, WBC only mildly elevated).  Ammonia level also WNL.  CTM. - delirium precautions  Lung Cancer s/p Resection: Stable Severe PAD: s/p left fem-pop and L CEA, bilateral RAS s/p stenting  Best practice:  Diet: Dysphagia I diet Pain/Anxiety/Delirium protocol (if indicated): alprazolam .25mg  bid prn VAP protocol (if indicated): n/a DVT prophylaxis: lovenox + SCDs GI prophylaxis: n/a Glucose control: daily BMP Mobility: bed rest Code Status: Full Family Communication: son was updated this morning (2/24) Disposition: ICU  Labs   CBC: Recent Labs  Lab 05/18/19 1343 05/18/19 1343 05/18/19 2126 05/18/19 2126 05/18/19 2133 05/18/19 2320 05/19/19 0103 05/19/19 0204 05/19/19 0547  WBC 9.4  --  9.3  --   --   --   --  10.8*  --   HGB 13.6   < > 12.7   < > 15.0 14.3 14.3 12.1 13.6  HCT 43.0   < > 38.1   < > 44.0 42.0 42.0 36.7 40.0  MCV 80.4  --  77.8*  --   --   --   --  77.1*  --   PLT 90*  --  74*  --   --   --   --  73*  --    < > = values in this interval not displayed.    Basic Metabolic Panel: Recent Labs  Lab 05/18/19 1343 05/18/19 1343 05/18/19 2125 05/18/19 2126 05/18/19 2133 05/18/19 2133 05/18/19 2320 05/19/19 0103 05/19/19 0204 05/19/19 0331 05/19/19 0547  NA 135   < >  --   --  136  --  139 138 139  --  140  K 3.7   < >  --   --  3.6   < > 2.9* 2.8* 2.8* 2.9* 3.9  CL 99  --   --   --   --   --   --  100 101  --   --   CO2 17*  --   --   --   --   --   --   --  24  --   --   GLUCOSE 114*  --   --   --   --   --   --  174* 182*  --   --   BUN 31*  --   --   --   --   --   --  32* 35*  --   --   CREATININE 2.33*  --   --  2.23*  --   --   --  1.90* 2.17*  --   --  CALCIUM 9.6  --   --   --   --   --   --   --  8.4*  --   --   MG  --    --  1.8  --   --   --   --   --   --   --   --    < > = values in this interval not displayed.   GFR: Estimated Creatinine Clearance: 26.4 mL/min (A) (by C-G formula based on SCr of 2.17 mg/dL (H)). Recent Labs  Lab 05/18/19 1343 05/18/19 1628 05/18/19 2126 05/18/19 2330 05/19/19 0204 05/19/19 0723 05/19/19 1013  WBC 9.4  --  9.3  --  10.8*  --   --   LATICACIDVEN  --    < > 5.4* 5.0*  --  1.5 1.2   < > = values in this interval not displayed.    Liver Function Tests: Recent Labs  Lab 05/19/19 0204  AST 31  ALT 24  ALKPHOS 75  BILITOT 3.5*  PROT 5.2*  ALBUMIN 2.9*   No results for input(s): LIPASE, AMYLASE in the last 168 hours. Recent Labs  Lab 05/18/19 1544  AMMONIA 18    ABG    Component Value Date/Time   PHART 7.460 (H) 05/19/2019 0547   PCO2ART 38.3 05/19/2019 0547   PO2ART 85.0 05/19/2019 0547   HCO3 27.2 05/19/2019 0547   TCO2 28 05/19/2019 0547   ACIDBASEDEF 3.0 (H) 05/18/2019 2320   O2SAT 97.0 05/19/2019 0547     Coagulation Profile: Recent Labs  Lab 05/19/19 0204  INR 1.8*    Cardiac Enzymes: No results for input(s): CKTOTAL, CKMB, CKMBINDEX, TROPONINI in the last 168 hours.  HbA1C: Hemoglobin A1C  Date/Time Value Ref Range Status  01/15/2016 11:15 AM 5.6  Final   Hgb A1c MFr Bld  Date/Time Value Ref Range Status  05/18/2019 09:26 PM 6.2 (H) 4.8 - 5.6 % Final    Comment:    (NOTE) Pre diabetes:          5.7%-6.4% Diabetes:              >6.4% Glycemic control for   <7.0% adults with diabetes   12/02/2014 12:54 PM 6.3 (H) 4.8 - 5.6 % Final    Comment:    (NOTE)         Pre-diabetes: 5.7 - 6.4         Diabetes: >6.4         Glycemic control for adults with diabetes: <7.0     CBG: Recent Labs  Lab 05/19/19 0632  GLUCAP 124*    Past Medical History  She,  has a past medical history of Anemia, Bronchogenic lung cancer, right ([place]) (11/29/2016), CAD (coronary artery disease), Carotid artery occlusion, CHF (congestive heart  failure) ([place]), Complication of anesthesia, COPD (chronic obstructive pulmonary disease) ([place]), GERD (gastroesophageal reflux disease), Headache(784.0), History of kidney stones, Hyperlipidemia, Hypertension, Leg pain, Peripheral vascular disease ([place]), Renal vascular disease (10/26/2014), Severe mitral regurgitation, Shortness of breath dyspnea, and Tobacco abuse.   Surgical History    Past Surgical History:  Procedure Laterality Date  . ABDOMINAL AORTOGRAM W/LOWER EXTREMITY Left 09/16/2018   Procedure: ABDOMINAL AORTOGRAM W/LOWER EXTREMITY;  Surgeon: Marty Heck, MD;  Location: [facility];  Service: Cardiovascular;  Laterality: Left;  . ABDOMINAL HYSTERECTOMY    . ANGIOPLASTY / STENTING ILIAC  2010   right external iliac by Dr. Irish Lack  . CARDIAC CATHETERIZATION  11/13/11  Left Heart Cath. with Coronary Angiogram  . CARDIAC CATHETERIZATION N/A 08/24/2014   Procedure: Left Heart Cath and Coronary Angiography;  Surgeon: Wellington Hampshire, MD;  Location: [facility];  Service: Cardiovascular;  Laterality: N/A;  . CARDIAC CATHETERIZATION N/A 10/26/2014   Procedure: Right/Left Heart Cath and Coronary Angiography;  Surgeon: Peter M Martinique, MD; oLAD 70%, mLAD 70% ISR, D2 30%, OFC 30%, RCA 20%, EF nl, low R heart pressures after diuresis, severe MR  . CAROTID ENDARTERECTOMY  11/21/2007   left  . COLONOSCOPY W/ POLYPECTOMY    . CORONARY ANGIOPLASTY WITH STENT PLACEMENT  6/09   LAD 2.5x12 [place]  . CORONARY ARTERY BYPASS GRAFT N/A 12/05/2014   Procedure: CORONARY ARTERY BYPASS GRAFTING (CABG);  Surgeon: [name], MD;  Location: [facility];  Service: Open Heart Surgery;  Laterality: N/A;  Times 1 using left internal mammary artery to LAD  . FEMORAL-POPLITEAL BYPASS GRAFT  10/12   left Dr. Kellie Simmering  . LOWER EXTREMITY ANGIOGRAPHY N/A 02/04/2018   Procedure: LOWER EXTREMITY ANGIOGRAPHY;  Surgeon: Marty Heck, MD;  Location: [facility];  Service: Cardiovascular;   Laterality: N/A;  . MITRAL VALVE REPAIR  2016  . MITRAL VALVE REPLACEMENT N/A 12/05/2014   Procedure: MITRAL VALVE (MV) REPLACEMENT;  Surgeon: [name], MD;  Location: [facility];  Service: Open Heart Surgery;  Laterality: N/A;  Closure left atrial appendage  . PERIPHERAL VASCULAR BALLOON ANGIOPLASTY Bilateral 09/16/2018   Procedure: PERIPHERAL VASCULAR BALLOON ANGIOPLASTY;  Surgeon: Marty Heck, MD;  Location: [facility];  Service: Cardiovascular;  Laterality: Bilateral;  bilateral renal arteries, Left external iliac  . PERIPHERAL VASCULAR INTERVENTION Left 02/04/2018   Procedure: PERIPHERAL VASCULAR INTERVENTION;  Surgeon: Marty Heck, MD;  Location: [facility];  Service: Cardiovascular;  Laterality: Left;  external iliac  . RENAL ANGIOGRAPHY Bilateral 09/16/2018   Procedure: RENAL ANGIOGRAPHY;  Surgeon: Marty Heck, MD;  Location: [facility];  Service: Cardiovascular;  Laterality: Bilateral;  . RENAL ARTERY STENT Bilateral   . TEE WITHOUT CARDIOVERSION N/A 10/24/2014   Procedure: TRANSESOPHAGEAL ECHOCARDIOGRAM (TEE);  Surgeon: Thayer Headings, MD;  Location: [facility];  Service: Cardiovascular;  Laterality: N/A;  . TEE WITHOUT CARDIOVERSION N/A 12/05/2014   Procedure: TRANSESOPHAGEAL ECHOCARDIOGRAM (TEE);  Surgeon: [name], MD;  Location: [facility];  Service: Open Heart Surgery;  Laterality: N/A;  . VIDEO ASSISTED THORACOSCOPY (VATS)/WEDGE RESECTION Right 11/29/2016   Procedure: VIDEO ASSISTED THORACOSCOPY (VATS)/ RUL WEDGE RESECTION OF LESION/ NODE SAMPLING;  Surgeon: [name], MD;  Location: [facility];  Service: Thoracic;  Laterality: Right;  Marland Kitchen VIDEO BRONCHOSCOPY N/A 11/29/2016   Procedure: VIDEO BRONCHOSCOPY;  Surgeon: [name], MD;  Location: [facility];  Service: Thoracic;  Laterality: N/A;     Social History   reports that she has been smoking cigarettes. She has a 17.50 pack-year smoking history. She has never used  smokeless tobacco. She reports current alcohol use. She reports that she does not use drugs.   Family History   Her family history includes ALS in her brother; Cancer in her mother; Heart disease in her father; Hypertension in her brother, father, and mother; Kidney disease in her father; Prostate cancer in her father; Stroke in her paternal aunt. There is no history of Heart attack, Colon cancer, Stomach cancer, Rectal cancer, Esophageal cancer, or Liver cancer.   Allergies Allergies  Allergen Reactions  . Lisinopril Swelling    Angioedema 06/10/11  .  Chantix [Varenicline] Other (See Comments)    Caused insomnia  . Penicillins Hives    Did it involve swelling of the face/tongue/throat, SOB, or low BP? No Did it involve sudden or severe rash/hives, skin peeling, or any reaction on the inside of your mouth or nose? No Did you need to seek medical attention at a hospital or doctor's office? No When did it last happen?50 Years If all above answers are "NO", may proceed with cephalosporin use.       Home Medications  Prior to Admission medications   Medication Sig Start Date End Date Taking? Authorizing Provider  cyclobenzaprine (FLEXERIL) 10 MG tablet Take 1 tablet by mouth three times daily as needed for muscle spasm 04/30/19   Guadalupe Dawn, MD  traMADol (ULTRAM) 50 MG tablet TAKE 1 TABLET BY MOUTH EVERY 6 HOURS AS NEEDED FOR PAIN 04/30/19   Guadalupe Dawn, MD  albuterol (PROVENTIL HFA;VENTOLIN HFA) 108 (90 Base) MCG/ACT inhaler Inhale 2 puffs into the lungs every 6 (six) hours as needed for wheezing or shortness of breath. 02/20/16   Archie Patten, MD  aspirin EC 81 MG tablet Take 1 tablet (81 mg total) by mouth daily. 07/24/16   Archie Patten, MD  atorvastatin (LIPITOR) 80 MG tablet Take 1 tablet (80 mg total) by mouth daily. 05/01/18 04/01/19  Martinique, Peter M, MD  clopidogrel (PLAVIX) 75 MG tablet Take 1 tablet by mouth once daily 02/26/19   Marty Heck, MD  ezetimibe  (ZETIA) 10 MG tablet Take 1 tablet (10 mg total) by mouth daily. Patient taking differently: Take 10 mg by mouth daily at 3 pm.  03/04/18 04/01/19  Martinique, Peter M, MD  fluticasone (FLOVENT HFA) 44 MCG/ACT inhaler Inhale 1 puff into the lungs daily. 04/12/19   Guadalupe Dawn, MD  furosemide (LASIX) 40 MG tablet Take 0.5 tablets (20 mg total) by mouth daily. Patient taking differently: Take 40 mg by mouth every evening.  06/29/18   Duke, Tami Lin, PA  gabapentin (NEURONTIN) 100 MG capsule TAKE 2 CAPSULES BY MOUTH THREE TIMES DAILY 02/28/19   Guadalupe Dawn, MD  isosorbide-hydrALAZINE (BIDIL) 20-37.5 MG tablet Take 1 tablet by mouth 3 (three) times daily. 03/10/18   Guadalupe Dawn, MD  metoprolol succinate (TOPROL-XL) 25 MG 24 hr tablet Take 1 tablet (25 mg total) by mouth daily. 06/24/18   Martinique, Peter M, MD  nitroGLYCERIN (NITROSTAT) 0.4 MG SL tablet Place 1 tablet (0.4 mg total) under the tongue every 5 (five) minutes x 3 doses as needed for chest pain. 01/29/19   Martinique, Peter M, MD  potassium chloride SA (KLOR-CON) 20 MEQ tablet Take 2 tablets ( 40 meq ) 4 times a day. 05/07/19   Martinique, Peter M, MD  traZODone (DESYREL) 100 MG tablet TAKE 1 TABLET BY MOUTH AT BEDTIME 02/23/19   Guadalupe Dawn, MD     Critical care time: 50 minutes    Mallie Darting, MS4

## 2019-05-19 NOTE — Progress Notes (Signed)
Patient's Mag level 1.5 and K+ 3.4.  Received verbal order from Dr. Haroldine Laws to give 4 mg mag sulfate and 4 runs of iv kcl.

## 2019-05-19 NOTE — Progress Notes (Signed)
FPTS Interim Progress Note FPTS Continues to follow Ms. Leah [name] socially.  We will gladly receive her when she is able to return to floor status. Until then we appreciate the Heart Failure team and the care they are providing to our mutual patient.  Gerlene Fee, DO 05/19/2019, 8:03 AM PGY-1, [facility] Service pager [phone]

## 2019-05-19 NOTE — Progress Notes (Signed)
Spoke to patient's son who confirmed that patient has had swallowing difficulties 'for a while' and has not been eating or drinking.

## 2019-05-19 NOTE — Progress Notes (Signed)
PT Cancellation Note  Patient Details Name: Leah [name] MRN: [id number] DOB: [birth year]   Cancelled Treatment:    Reason Eval/Treat Not Completed: Fatigue/lethargy limiting ability to participate;Medical issues which prohibited therapy. Per discussion with RN patient is obtunded this morning and unable to follow any commands. Pt also remains with active bedrest orders in at this time. Pt will hold until the patient is more alert and medically appropriate to participate in PT evaluation.   Zenaida Niece 05/19/2019, 8:33 AM

## 2019-05-19 NOTE — Progress Notes (Signed)
OT Cancellation Note  Patient Details Name: Leah [name] MRN: [id number] DOB: 1950-10-25   Cancelled Treatment:    Reason Eval/Treat Not Completed: Active bedrest order;Fatigue/lethargy limiting ability to participate(Pt not following commands and remains obtunded.) Pt also remains with active bedrest orders in at this time. Pt will hold until the patient is more alert and medically appropriate to participate in OT evaluation.   Jefferey Pica, OTR/L Acute Rehabilitation Services Pager: [phone] Office: [phone]  Jefferey Pica 05/19/2019, 9:37 AM

## 2019-05-19 NOTE — Progress Notes (Signed)
SLP Cancellation Note  Patient Details Name: VANASSA PENNIMAN MRN: [id number] DOB: January 07, 1951   Cancelled treatment:       Reason Eval/Treat Not Completed: Patient not medically ready (RN reported that the pt has been obtunded during this shift. She was approached by this SLP and she opened her eyes following verbal and tactile stimulation but was unable to maintain an adequate level of alertness. SLP will follow up on subsequent date unless contacted by medical team about an improvement in status today).   Jeriel Vivanco I. Hardin Negus, [place], [place] Office number [phone] Pager [place] 05/19/2019, 9:09 AM

## 2019-05-19 NOTE — Progress Notes (Signed)
I was called for a second opinion regarding Ms. Edmundson's neuro exam. Due to no true baseline since admission, LSW unknown.  Ms. Hegeman is lethargic, slow to answer questions and intermittently answers questions appropriately. Negative drift on bilateral upper extremities and bilateral lower extremities are equally weak and will not resist gravity. It appears Ms. Bouwens has a right facial droop but when asked to smile, puff out her cheeks and squint her eyes close her face is symmetrical with no droop. She is edentulous which could account for her protruding right lower lip. She has some dysarthria which is consistent with her original presentation 2/23 about 2 pm. She was able to identify objects correctly and answer processing questions like what's 2+2 correctly with adequate time to answer. She does have delayed responses also since admission. She did not answer month or age correctly. Sensation, vision and gaze were without deficit. Ataxia not tested. CTH yesterday showed no acute abnormality.

## 2019-05-20 ENCOUNTER — Inpatient Hospital Stay: Payer: Self-pay

## 2019-05-20 LAB — BASIC METABOLIC PANEL
Anion gap: 12 (ref 5–15)
Anion gap: 10 (ref 5–15)
BUN: 35 mg/dL — ABNORMAL HIGH (ref 8–23)
BUN: 37 mg/dL — ABNORMAL HIGH (ref 8–23)
CO2: 26 mmol/L (ref 22–32)
CO2: 26 mmol/L (ref 22–32)
Calcium: 8.6 mg/dL — ABNORMAL LOW (ref 8.9–10.3)
Calcium: 8.3 mg/dL — ABNORMAL LOW (ref 8.9–10.3)
Chloride: 101 mmol/L (ref 98–111)
Chloride: 99 mmol/L (ref 98–111)
Creatinine, Ser: 1.97 mg/dL — ABNORMAL HIGH (ref 0.44–1.00)
Creatinine, Ser: 1.84 mg/dL — ABNORMAL HIGH (ref 0.44–1.00)
GFR calc Af Amer: 30 mL/min — ABNORMAL LOW (ref >60)
GFR calc Af Amer: 33 mL/min — ABNORMAL LOW (ref >60)
GFR calc non Af Amer: 26 mL/min — ABNORMAL LOW (ref >60)
GFR calc non Af Amer: 28 mL/min — ABNORMAL LOW (ref >60)
Glucose, Bld: 105 mg/dL — ABNORMAL HIGH (ref 70–99)
Glucose, Bld: 141 mg/dL — ABNORMAL HIGH (ref 70–99)
Potassium: 3.8 mmol/L (ref 3.5–5.1)
Potassium: 4 mmol/L (ref 3.5–5.1)
Sodium: 139 mmol/L (ref 135–145)
Sodium: 135 mmol/L (ref 135–145)

## 2019-05-20 LAB — C-REACTIVE PROTEIN: CRP: 2.6 mg/dL — ABNORMAL HIGH (ref <1.0)

## 2019-05-20 LAB — CALCIUM / CREATININE RATIO, URINE
Calcium, Ur: 1 mg/dL
Calcium/Creat.Ratio: 5 mg/g{creat} — ABNORMAL LOW (ref 29–442)
Creatinine, Urine: 191.3 mg/dL

## 2019-05-20 LAB — CBC
HCT: 38.1 % (ref 36.0–46.0)
Hemoglobin: 12.2 g/dL (ref 12.0–15.0)
MCH: 24.9 pg — ABNORMAL LOW (ref 26.0–34.0)
MCHC: 32 g/dL (ref 30.0–36.0)
MCV: 77.8 fL — ABNORMAL LOW (ref 80.0–100.0)
Platelets: 66 10*3/uL — ABNORMAL LOW (ref 150–400)
RBC: 4.9 MIL/uL (ref 3.87–5.11)
RDW: 21.3 % — ABNORMAL HIGH (ref 11.5–15.5)
WBC: 11.1 10*3/uL — ABNORMAL HIGH (ref 4.0–10.5)
nRBC: 1.2 % — ABNORMAL HIGH (ref 0.0–0.2)

## 2019-05-20 LAB — COOXEMETRY PANEL
Carboxyhemoglobin: 0.9 % (ref 0.5–1.5)
Methemoglobin: 0.7 % (ref 0.0–1.5)
O2 Saturation: 64.5 %
Total hemoglobin: 12.6 g/dL (ref 12.0–16.0)

## 2019-05-20 LAB — SEDIMENTATION RATE: Sed Rate: 3 mm/hr (ref 0–22)

## 2019-05-20 LAB — MAGNESIUM
Magnesium: 2.4 mg/dL (ref 1.7–2.4)
Magnesium: 2 mg/dL (ref 1.7–2.4)

## 2019-05-20 LAB — T3, FREE: T3, Free: 1.9 pg/mL — ABNORMAL LOW (ref 2.0–4.4)

## 2019-05-20 MED ORDER — SODIUM CHLORIDE 0.9% FLUSH
10.0000 mL | Freq: Two times a day (BID) | INTRAVENOUS | Status: DC
  Administered 2019-05-20: 21:00:00 20 mL
  Administered 2019-05-21: 10:00:00 10 mL

## 2019-05-20 MED ORDER — ISOSORB DINITRATE-HYDRALAZINE 20-37.5 MG PO TABS
0.5000 | ORAL_TABLET | Freq: Three times a day (TID) | ORAL | Status: DC
  Administered 2019-05-20 (×3): 0.5 via ORAL
  Filled 2019-05-20 (×3): qty 1

## 2019-05-20 MED ORDER — POTASSIUM CHLORIDE CRYS ER 20 MEQ PO TBCR
20.0000 meq | EXTENDED_RELEASE_TABLET | Freq: Once | ORAL | Status: AC
  Administered 2019-05-20: 10:00:00 20 meq via ORAL
  Filled 2019-05-20: qty 1

## 2019-05-20 MED ORDER — SODIUM CHLORIDE 0.9% FLUSH: 10.0000 mL | INTRAVENOUS | Status: DC | PRN

## 2019-05-20 NOTE — Evaluation (Addendum)
Physical Therapy Evaluation Patient Details Name: Leah [name] MRN: [id number] DOB: 1951-01-16 Today's Date: 05/20/2019   History of Present Illness  69 y.o. 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 Pt brought to the ED for AMS, now with concern for cardiogenic shock.  Clinical Impression  *Verbal orders provided by Dr. Jeffie Pollock to mobilize patient despite bed rest orders as most current activity orders in chart* Pt presents to PT with deficits in functional mobility, gait, balance, endurance, strength, power, cognition. PT evaluation limited due to Gordy Councilman catheter. Pt requiring minA to transfer and mobilize in bed, needing more significant assistance to maintain dynamic standing balance during pre-gait marching. Pt will benefit from aggressive gait training and mobilization upon removal of swan ganz catheter to aide in a return to independent mobility.    Follow Up Recommendations Home health PT;Supervision/Assistance - 24 hour    Equipment Recommendations  Rolling walker with 5" wheels    Recommendations for Other Services       Precautions / Restrictions Precautions Precautions: Fall Precaution Comments: Gordy Councilman Restrictions Weight Bearing Restrictions: No      Mobility  Bed Mobility Overal bed mobility: Needs Assistance Bed Mobility: Supine to Sit;Sit to Supine     Supine to sit: Min assist Sit to supine: Min assist      Transfers Overall transfer level: Needs assistance   Transfers: Sit to/from Stand Sit to Stand: Min assist            Ambulation/Gait Ambulation/Gait assistance: Mod assist Gait Distance (Feet): 0 Feet Assistive device: 2 person hand held assist Gait Pattern/deviations: Step-to pattern Gait velocity: 0 Gait velocity interpretation: <1.31 ft/sec, indicative  of household ambulator General Gait Details: pt marching in place, posterior trunk lean requiring modA to correct  Stairs            Wheelchair Mobility    Modified Rankin (Stroke Patients Only)       Balance Overall balance assessment: Needs assistance Sitting-balance support: Single extremity supported;Feet supported Sitting balance-[name]: Fair Sitting balance - Comments: minG at edge of bed   Standing balance support: Bilateral upper extremity supported Standing balance-[name]: Fair Standing balance comment: minA static standing balance                             Pertinent Vitals/Pain Pain Assessment: No/denies pain    Home Living Family/patient expects to be discharged to:: Private residence Living Arrangements: Children Available Help at Discharge: Family;Available 24 hours/day Type of Home: Apartment Home Access: Stairs to enter Entrance Stairs-Rails: Right Entrance Stairs-Number of Steps: flight, no elevator Home Layout: One level Home Equipment: None      Prior Function Level of Independence: Independent         Comments: completing BADL independently as well as IADL. Ambulating without equipment     Hand Dominance   Dominant Hand: Right    Extremity/Trunk Assessment   Upper Extremity Assessment Upper Extremity Assessment: Defer to OT evaluation    Lower Extremity Assessment Lower Extremity Assessment: Generalized weakness    Cervical / Trunk Assessment Cervical / Trunk Assessment: Normal  Communication   Communication: No difficulties  Cognition Arousal/Alertness: Awake/alert Behavior During Therapy: WFL for tasks assessed/performed Overall Cognitive Status: Impaired/Different from baseline Area of Impairment:  Orientation                 Orientation Level: Time             General Comments: cues to get to accurate date      General Comments General comments (skin integrity, edema, etc.): VSS  on 3L [place], swan ganz    Exercises     Assessment/Plan    PT Assessment Patient needs continued PT services  PT Problem List Decreased strength;Decreased activity tolerance;Decreased balance;Decreased mobility;Decreased knowledge of use of DME;Decreased safety awareness;Decreased knowledge of precautions;Cardiopulmonary status limiting activity       PT Treatment Interventions DME instruction;Gait training;Stair training;Functional mobility training;Therapeutic activities;Therapeutic exercise;Balance training;Neuromuscular re-education;Patient/family education    PT Goals (Current goals can be found in the Care Plan section)  Acute Rehab PT Goals Patient Stated Goal: To return to independent mobility PT Goal Formulation: With patient Time For Goal Achievement: 06/03/19 Potential to Achieve Goals: Good    Frequency Min 3X/week   Barriers to discharge        Co-evaluation PT/OT/SLP Co-Evaluation/Treatment: Yes Reason for Co-Treatment: Complexity of the patient's impairments (multi-system involvement);Necessary to address cognition/behavior during functional activity;For patient/therapist safety;To address functional/ADL transfers PT goals addressed during session: Mobility/safety with mobility;Balance;Strengthening/ROM         AM-PAC PT "6 Clicks" Mobility  Outcome Measure Help needed turning from your back to your side while in a flat bed without using bedrails?: A Little Help needed moving from lying on your back to sitting on the side of a flat bed without using bedrails?: A Little Help needed moving to and from a bed to a chair (including a wheelchair)?: A Little Help needed standing up from a chair using your arms (e.g., wheelchair or bedside chair)?: A Little Help needed to walk in hospital room?: A Lot Help needed climbing 3-5 steps with a railing? : Total 6 Click Score: 15    End of Session Equipment Utilized During Treatment: Oxygen Activity Tolerance: Patient  tolerated treatment well Patient left: in bed;with call bell/phone within reach;with bed alarm set Nurse Communication: Mobility status PT Visit Diagnosis: Unsteadiness on feet (R26.81)    Time: 6440-3474 PT Time Calculation (min) (ACUTE ONLY): 28 min   Charges:   PT Evaluation $PT Eval Moderate Complexity: 1 Mod          Zenaida Niece, PT, DPT Acute Rehabilitation Pager: [phone]   Zenaida Niece 05/20/2019, 12:38 PM

## 2019-05-20 NOTE — Progress Notes (Signed)
NAME:  CARIME DINKEL, MRN:  [id number], DOB:  08/12/1950, LOS: 2 ADMISSION DATE:  05/18/2019, CHIEF COMPLAINT:  Cardiogenic Shock   Brief History   Takiera Mayo is a 69 yo F w/ PMHx of CAD s/p CABG and MV replacement in 2016, HTN, HLD, PVD s/p left fem-pop and L CEA and s/p bilateral RAS stenting, HFpEF, CKD, and bronchogenic lung cancer s/p resection who presented on 2/23 with AMS.  Her last known normal was 2/22.  She was found by her son lethargic and largely unresponsive on 2/23 and was subsequently brought to the ER.  She later remembered sitting down b/c she was tired and having chest pain.  Patient was found to be in cardiogenic shock and started on milrinone.  Past Medical History   Past Medical History:  Diagnosis Date  . Anemia   . Bronchogenic lung cancer, right ([place]) 11/29/2016  . CAD (coronary artery disease)    a. s/p LIMA-LAD in 11/2014  . Carotid artery occlusion   . CHF (congestive heart failure) ([place])   . Complication of anesthesia    slow to awaken x 1 maybe 2001  . COPD (chronic obstructive pulmonary disease) ([place])   . GERD (gastroesophageal reflux disease)    "sometimes" takes Copywriter, advertising or drinks gingerale   . Headache(784.0)   . History of kidney stones   . Hyperlipidemia   . Hypertension   . Leg pain   . Peripheral vascular disease ([place])   . Renal vascular disease 10/26/2014   bilateral stents placed   . Severe mitral regurgitation    a. s/p MVR in 11/2014 with a pericardial tissue valve  . Shortness of breath dyspnea   . Tobacco abuse      Significant Hospital Events   2/23 Admission  Consults:  Heart failure Speech and Language PT/OT  Procedures:    Significant Diagnostic Tests:  2/23 CT head - no acute findings 2/23 CT chest/abd/pelvis - no change in AAA 2/23 CXR - cardiomegaly 2/23 EKG - sinus tachy, R atrial enlargment, L axis deviation, L ventricular hypertrophy, T wave abnormality, consider lateral ischemia, can't rule out  septal infarct 2/23 BNP - >4500  Micro Data:  2/24 MRSA neg 2/23 UA neg for nitrites, leuk est 2/23 neg for covid, influenza A &B 2/23 blood cultures NGTD (<24 hours)  Antimicrobials:  None   Interim history/subjective:  NAEON.  Patient much more alert this morning on exam.  Oriented to person, place, and year, but not day of the week.  Remains on Milrinone .32mcg.  Objective   Blood pressure 92/62, pulse (!) 104, temperature (!) 97.2 F (36.2 C), resp. rate 20, height 6\' 1"  (1.854 m), weight 64.8 kg, SpO2 98 %. PAP: (33-73)/(23-51) 33/23 CVP:  [9 mmHg-13 mmHg] 9 mmHg CO:  [2.7 L/min-3.2 L/min] 3 L/min CI:  [1.6 L/min/m2-1.7 L/min/m2] 1.6 L/min/m2      Intake/Output Summary (Last 24 hours) at 05/20/2019 0804 Last data filed at 05/20/2019 0600 Gross per 24 hour  Intake 743.42 ml  Output 1280 ml  Net -536.58 ml   Filed Weights   05/18/19 1845 05/19/19 0500 05/20/19 0500  Weight: 67.4 kg 65.7 kg 64.8 kg    Examination: General: chronically ill-appearing woman lying in bed, in NAD HENT: trachea midline, MMM Lungs: CTAB  Cardiovascular: irregular rate and rhythm, tachycardic Abdomen: soft, non-distended Extremities: warm and dry  Neuro: alert and oriented to person, place, and year, but not day of the week Skin: no rashes  Resolved  Hospital Problem list     Assessment & Plan:  Cardiogenic Shock 2/2 Severe Ventricular Dysfunction: Echo in ER with EF of 10% (from 40% in 12/2016) and severe RV dysfunction.  Swan in place.  Not a candidate for mechanical support with PAD and severe AI.  1.3L in UOP after 80 IV Lasix yesterday (2/25) - Cr up slightly (1.97 on 2/25). - continue milrinone - start IV amio if ventricular ectopy worsens - consider eventual coronary angio if improving - palliative care consult to discuss [place] w/ family - goal K+ >4 given ventricular ectopy  Aortic Insuffiency: moderate to severe on Echo in ER.  Blood cultures NGTD (<24 hours).   - per HF, may  consider TEE if patient improving - f/u blood cultures  Dysphagia: Pt reports difficulty swallowing and weight loss (65.7kg on admission from 72.1kg in July 2020).  Patient denies history of radiation with lung cancer.  Swallow study on 2/24 indicated likely esophageal dysphagia.  Oral motor strength and ROM appeared grossly within functional limits.  She was edentulous at time of exam and son will bring dentures soon.  She tolerated all solids and liquids without s/sx of aspiration or other pharyngeal symptoms.   - consider EGD when patient is stronger  AoCKD: Last BMP prior to admission was 1.78 on 2/11.  AKI likely 2/2 shock.  Creatinine slightly up today (2/25) following diuresis yesterday.  - daily BMP - hold diuresis  Hypokalemia: -replete electrolytes - goal K+ > 4.0  Altered Mental Status: Likely 2/2 severe shock given significant improvement on milrinone.  Significantly improved today (2/25). - delirium precautions  Lung Cancer s/p Resection (no radiation per pt): Stable.   Severe PAD: s/p left fem-pop and L CEA, bilateral RAS s/p stenting  Best practice:  Diet: Dysphagia I diet Pain/Anxiety/Delirium protocol (if indicated): alprazolam .25mg  bid prn VAP protocol (if indicated): n/a DVT prophylaxis: lovenox + SCDs GI prophylaxis: n/a Glucose control: daily BMP Mobility: bed rest Code Status: Full Family Communication: son was updated this morning (2/24) Disposition: ICU  Labs   CBC: Recent Labs  Lab 05/18/19 1343 05/18/19 1343 05/18/19 2126 05/18/19 2126 05/18/19 2133 05/18/19 2320 05/19/19 0103 05/19/19 0204 05/19/19 0547  WBC 9.4  --  9.3  --   --   --   --  10.8*  --   HGB 13.6   < > 12.7   < > 15.0 14.3 14.3 12.1 13.6  HCT 43.0   < > 38.1   < > 44.0 42.0 42.0 36.7 40.0  MCV 80.4  --  77.8*  --   --   --   --  77.1*  --   PLT 90*  --  74*  --   --   --   --  73*  --    < > = values in this interval not displayed.    Basic Metabolic Panel: Recent Labs    Lab 05/18/19 1343 05/18/19 1343 05/18/19 2125 05/18/19 2126 05/18/19 2133 05/19/19 0103 05/19/19 0103 05/19/19 0204 05/19/19 0331 05/19/19 0547 05/19/19 1510 05/19/19 1559 05/20/19 0227  NA 135  --   --   --    < > 138  --  139  --  140  --  136 139  K 3.7  --   --   --    < > 2.8*   < > 2.8* 2.9* 3.9  --  3.4* 3.8  CL 99  --   --   --   --  100  --  101  --   --   --  100 101  CO2 17*  --   --   --   --   --   --  24  --   --   --  26 26  GLUCOSE 114*  --   --   --   --  174*  --  182*  --   --   --  105* 105*  BUN 31*  --   --   --   --  32*  --  35*  --   --   --  34* 35*  CREATININE 2.33*   < >  --  2.23*  --  1.90*  --  2.17*  --   --   --  1.86* 1.97*  CALCIUM 9.6  --   --   --   --   --   --  8.4*  --   --   --  8.6* 8.6*  MG  --   --  1.8  --   --   --   --   --   --   --  1.5*  --  2.4   < > = values in this interval not displayed.   GFR: Estimated Creatinine Clearance: 28.7 mL/min (A) (by C-G formula based on SCr of 1.97 mg/dL (H)). Recent Labs  Lab 05/18/19 1343 05/18/19 1628 05/18/19 2126 05/18/19 2330 05/19/19 0204 05/19/19 0723 05/19/19 1013  WBC 9.4  --  9.3  --  10.8*  --   --   LATICACIDVEN  --    < > 5.4* 5.0*  --  1.5 1.2   < > = values in this interval not displayed.    Liver Function Tests: Recent Labs  Lab 05/19/19 0204  AST 31  ALT 24  ALKPHOS 75  BILITOT 3.5*  PROT 5.2*  ALBUMIN 2.9*   No results for input(s): LIPASE, AMYLASE in the last 168 hours. Recent Labs  Lab 05/18/19 1544  AMMONIA 18    ABG    Component Value Date/Time   PHART 7.460 (H) 05/19/2019 0547   PCO2ART 38.3 05/19/2019 0547   PO2ART 85.0 05/19/2019 0547   HCO3 27.2 05/19/2019 0547   TCO2 28 05/19/2019 0547   ACIDBASEDEF 3.0 (H) 05/18/2019 2320   O2SAT 64.5 05/20/2019 0535     Coagulation Profile: Recent Labs  Lab 05/19/19 0204  INR 1.8*    Cardiac Enzymes: No results for input(s): CKTOTAL, CKMB, CKMBINDEX, TROPONINI in the last 168  hours.  HbA1C: Hemoglobin A1C  Date/Time Value Ref Range Status  01/15/2016 11:15 AM 5.6  Final   Hgb A1c MFr Bld  Date/Time Value Ref Range Status  05/18/2019 09:26 PM 6.2 (H) 4.8 - 5.6 % Final    Comment:    (NOTE) Pre diabetes:          5.7%-6.4% Diabetes:              >6.4% Glycemic control for   <7.0% adults with diabetes   12/02/2014 12:54 PM 6.3 (H) 4.8 - 5.6 % Final    Comment:    (NOTE)         Pre-diabetes: 5.7 - 6.4         Diabetes: >6.4         Glycemic control for adults with diabetes: <7.0     CBG: Recent Labs  Lab 05/19/19 0632  GLUCAP 124*    Past  Medical History  She,  has a past medical history of Anemia, Bronchogenic lung cancer, right ([place]) (11/29/2016), CAD (coronary artery disease), Carotid artery occlusion, CHF (congestive heart failure) ([place]), Complication of anesthesia, COPD (chronic obstructive pulmonary disease) ([place]), GERD (gastroesophageal reflux disease), Headache(784.0), History of kidney stones, Hyperlipidemia, Hypertension, Leg pain, Peripheral vascular disease ([place]), Renal vascular disease (10/26/2014), Severe mitral regurgitation, Shortness of breath dyspnea, and Tobacco abuse.   Surgical History    Past Surgical History:  Procedure Laterality Date  . ABDOMINAL AORTOGRAM W/LOWER EXTREMITY Left 09/16/2018   Procedure: ABDOMINAL AORTOGRAM W/LOWER EXTREMITY;  Surgeon: Marty Heck, MD;  Location: [facility];  Service: Cardiovascular;  Laterality: Left;  . ABDOMINAL HYSTERECTOMY    . ANGIOPLASTY / STENTING ILIAC  2010   right external iliac by Dr. Irish Lack  . CARDIAC CATHETERIZATION  11/13/11   Left Heart Cath. with Coronary Angiogram  . CARDIAC CATHETERIZATION N/A 08/24/2014   Procedure: Left Heart Cath and Coronary Angiography;  Surgeon: Wellington Hampshire, MD;  Location: [facility];  Service: Cardiovascular;  Laterality: N/A;  . CARDIAC CATHETERIZATION N/A 10/26/2014   Procedure: Right/Left Heart Cath and Coronary  Angiography;  Surgeon: Peter M Martinique, MD; oLAD 70%, mLAD 70% ISR, D2 30%, OFC 30%, RCA 20%, EF nl, low R heart pressures after diuresis, severe MR  . CAROTID ENDARTERECTOMY  11/21/2007   left  . COLONOSCOPY W/ POLYPECTOMY    . CORONARY ANGIOPLASTY WITH STENT PLACEMENT  6/09   LAD 2.5x12 [place]  . CORONARY ARTERY BYPASS GRAFT N/A 12/05/2014   Procedure: CORONARY ARTERY BYPASS GRAFTING (CABG);  Surgeon: [name], MD;  Location: [facility];  Service: Open Heart Surgery;  Laterality: N/A;  Times 1 using left internal mammary artery to LAD  . FEMORAL-POPLITEAL BYPASS GRAFT  10/12   left Dr. Kellie Simmering  . LOWER EXTREMITY ANGIOGRAPHY N/A 02/04/2018   Procedure: LOWER EXTREMITY ANGIOGRAPHY;  Surgeon: Marty Heck, MD;  Location: [facility];  Service: Cardiovascular;  Laterality: N/A;  . MITRAL VALVE REPAIR  2016  . MITRAL VALVE REPLACEMENT N/A 12/05/2014   Procedure: MITRAL VALVE (MV) REPLACEMENT;  Surgeon: [name], MD;  Location: [facility];  Service: Open Heart Surgery;  Laterality: N/A;  Closure left atrial appendage  . PERIPHERAL VASCULAR BALLOON ANGIOPLASTY Bilateral 09/16/2018   Procedure: PERIPHERAL VASCULAR BALLOON ANGIOPLASTY;  Surgeon: Marty Heck, MD;  Location: [facility];  Service: Cardiovascular;  Laterality: Bilateral;  bilateral renal arteries, Left external iliac  . PERIPHERAL VASCULAR INTERVENTION Left 02/04/2018   Procedure: PERIPHERAL VASCULAR INTERVENTION;  Surgeon: Marty Heck, MD;  Location: [facility];  Service: Cardiovascular;  Laterality: Left;  external iliac  . RENAL ANGIOGRAPHY Bilateral 09/16/2018   Procedure: RENAL ANGIOGRAPHY;  Surgeon: Marty Heck, MD;  Location: [facility];  Service: Cardiovascular;  Laterality: Bilateral;  . RENAL ARTERY STENT Bilateral   . TEE WITHOUT CARDIOVERSION N/A 10/24/2014   Procedure: TRANSESOPHAGEAL ECHOCARDIOGRAM (TEE);  Surgeon: Thayer Headings, MD;  Location: [facility];   Service: Cardiovascular;  Laterality: N/A;  . TEE WITHOUT CARDIOVERSION N/A 12/05/2014   Procedure: TRANSESOPHAGEAL ECHOCARDIOGRAM (TEE);  Surgeon: [name], MD;  Location: [facility];  Service: Open Heart Surgery;  Laterality: N/A;  . VIDEO ASSISTED THORACOSCOPY (VATS)/WEDGE RESECTION Right 11/29/2016   Procedure: VIDEO ASSISTED THORACOSCOPY (VATS)/ RUL WEDGE RESECTION OF LESION/ NODE SAMPLING;  Surgeon: [name], MD;  Location: [facility];  Service: Thoracic;  Laterality: Right;  .  VIDEO BRONCHOSCOPY N/A 11/29/2016   Procedure: VIDEO BRONCHOSCOPY;  Surgeon: [name], MD;  Location: [facility];  Service: Thoracic;  Laterality: N/A;     Social History   reports that she has been smoking cigarettes. She has a 17.50 pack-year smoking history. She has never used smokeless tobacco. She reports current alcohol use. She reports that she does not use drugs.   Family History   Her family history includes ALS in her brother; Cancer in her mother; Heart disease in her father; Hypertension in her brother, father, and mother; Kidney disease in her father; Prostate cancer in her father; Stroke in her paternal aunt. There is no history of Heart attack, Colon cancer, Stomach cancer, Rectal cancer, Esophageal cancer, or Liver cancer.   Allergies Allergies  Allergen Reactions  . Lisinopril Swelling    Angioedema 06/10/11  . Chantix [Varenicline] Other (See Comments)    Caused insomnia  . Penicillins Hives    Did it involve swelling of the face/tongue/throat, SOB, or low BP? No Did it involve sudden or severe rash/hives, skin peeling, or any reaction on the inside of your mouth or nose? No Did you need to seek medical attention at a hospital or doctor's office? No When did it last happen?50 Years If all above answers are "NO", may proceed with cephalosporin use.       Home Medications  Prior to Admission medications   Medication Sig Start Date End Date Taking? Authorizing Provider   cyclobenzaprine (FLEXERIL) 10 MG tablet Take 1 tablet by mouth three times daily as needed for muscle spasm 04/30/19   Guadalupe Dawn, MD  traMADol (ULTRAM) 50 MG tablet TAKE 1 TABLET BY MOUTH EVERY 6 HOURS AS NEEDED FOR PAIN 04/30/19   Guadalupe Dawn, MD  albuterol (PROVENTIL HFA;VENTOLIN HFA) 108 (90 Base) MCG/ACT inhaler Inhale 2 puffs into the lungs every 6 (six) hours as needed for wheezing or shortness of breath. 02/20/16   Archie Patten, MD  aspirin EC 81 MG tablet Take 1 tablet (81 mg total) by mouth daily. 07/24/16   Archie Patten, MD  atorvastatin (LIPITOR) 80 MG tablet Take 1 tablet (80 mg total) by mouth daily. 05/01/18 04/01/19  Martinique, Peter M, MD  clopidogrel (PLAVIX) 75 MG tablet Take 1 tablet by mouth once daily 02/26/19   Marty Heck, MD  ezetimibe (ZETIA) 10 MG tablet Take 1 tablet (10 mg total) by mouth daily. Patient taking differently: Take 10 mg by mouth daily at 3 pm.  03/04/18 04/01/19  Martinique, Peter M, MD  fluticasone (FLOVENT HFA) 44 MCG/ACT inhaler Inhale 1 puff into the lungs daily. 04/12/19   Guadalupe Dawn, MD  furosemide (LASIX) 40 MG tablet Take 0.5 tablets (20 mg total) by mouth daily. Patient taking differently: Take 40 mg by mouth every evening.  06/29/18   Duke, Tami Lin, PA  gabapentin (NEURONTIN) 100 MG capsule TAKE 2 CAPSULES BY MOUTH THREE TIMES DAILY 02/28/19   Guadalupe Dawn, MD  isosorbide-hydrALAZINE (BIDIL) 20-37.5 MG tablet Take 1 tablet by mouth 3 (three) times daily. 03/10/18   Guadalupe Dawn, MD  metoprolol succinate (TOPROL-XL) 25 MG 24 hr tablet Take 1 tablet (25 mg total) by mouth daily. 06/24/18   Martinique, Peter M, MD  nitroGLYCERIN (NITROSTAT) 0.4 MG SL tablet Place 1 tablet (0.4 mg total) under the tongue every 5 (five) minutes x 3 doses as needed for chest pain. 01/29/19   Martinique, Peter M, MD  potassium chloride SA (KLOR-CON) 20 MEQ tablet Take 2  tablets ( 40 meq ) 4 times a day. 05/07/19   Martinique, Peter M, MD  traZODone (DESYREL) 100 MG  tablet TAKE 1 TABLET BY MOUTH AT BEDTIME 02/23/19   Guadalupe Dawn, MD     Critical care time: 35 minutes    Mallie Darting, MS4

## 2019-05-20 NOTE — Progress Notes (Signed)
FPTS Interim Progress Note FPTS Continues to follow Ms. Lauderdale socially.  We will gladly receive her when she is able to return to floor status. Until then we appreciate the Heart Failure team and the care they are providing to our mutual patient.  Gerlene Fee, DO 05/20/2019, 8:07 AM PGY-1, [facility] pager [phone]

## 2019-05-20 NOTE — Plan of Care (Signed)
  Problem: Clinical Measurements: Goal: Ability to maintain clinical measurements within normal limits will improve Outcome: Progressing Goal: Respiratory complications will improve Outcome: Progressing   Problem: Nutrition: Goal: Adequate nutrition will be maintained Outcome: Not Progressing   Problem: Coping: Goal: Level of anxiety will decrease Outcome: Progressing   Problem: Elimination: Goal: Will not experience complications related to urinary retention Outcome: Progressing   Problem: Pain Managment: Goal: General experience of comfort will improve Outcome: Progressing   Problem: Safety: Goal: Ability to remain free from injury will improve Outcome: Progressing   Problem: Skin Integrity: Goal: Risk for impaired skin integrity will decrease Outcome: Progressing

## 2019-05-20 NOTE — Progress Notes (Signed)
  Speech Language Pathology Treatment: Dysphagia  Patient Details Name: Leah [name] MRN: [id number] DOB: 23-Oct-1950 Today's Date: 05/20/2019 Time: 4431-5400 SLP Time Calculation (min) (ACUTE ONLY): 25 min  Assessment / Plan / Recommendation Clinical Impression  Pt was seen for dysphagia treatment and was alert throughout the session with intermittent verbal stimulation to maintain alertness. A dysphagia 1 diet was recommended following bedside swallow evaluation and ordered by SLP on 05/19/19. However, it appears that she was subsequently placed on a regular consistency diet when dietary restrictions were inputted to the system. Pt and nursing reported that the pt has been tolerating the current diet without s/sx of aspiration. She was seen during lunch and consumed a meal of mashed sweet potato, collard greens, roast pork loin, and thin liquids via straw. Mastication time was moderately increased with regular texture solids secondary to edentulous status and, per pt's request, meats were ultimately removed from her mouth due to difficulty with mastication. Mastication time was [facility] with dysphagia 2 and dysphagia 3 solids. No significant oral residue was noted with dysphagia 2/3 solids. It is recommended that the pt's diet be downgraded to dysphagia 3 and SLP will follow to ensure diet tolerance.    HPI HPI: Pt is a 69 y.o. female who presented with altered mental status. PMH is significant for CAD, HTN, HLD, PVD, HFpEF, CKD, MV replacement, Hx bronchogenic lung cancer, and tobacco use. Chest x-ray 2/24: Mild congestive heart failure, slightly worsened.       SLP Plan  Continue with current plan of care       Recommendations  Diet recommendations: Dysphagia 3 (mechanical soft);Thin liquid Liquids provided via: Cup;Straw Medication Administration: Crushed with puree Supervision: Intermittent supervision to cue for compensatory strategies Compensations: Slow rate;Small sips/bites              Oral Care Recommendations: Oral care BID Follow up Recommendations: None SLP Visit Diagnosis: Dysphagia, unspecified (R13.10) Plan: Continue with current plan of care       Carolie Mcilrath I. Hardin Negus, [place], [place] Office number [phone] Pager [place] 05/20/2019, 12:07 PM

## 2019-05-20 NOTE — Evaluation (Addendum)
Occupational Therapy Evaluation Patient Details Name: Leah [name] MRN: [id number] DOB: 1950-12-12 Today's Date: 05/20/2019    History of Present Illness 69 y.o. 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 Pt brought to the ED for AMS, now with concern for cardiogenic shock.   Clinical Impression   PTA pt living at home with children, independent for BADL/IADL. At time of eval, pt able to complete bed mobility at min A level and sit <> stand x3 with min A +2 for line management. Pt able to don socks with set up A while long sitting in bed. She will benefit from continued BADL training to progress endurance and activity tolerance. Recommend HHOT at d/c for continued progression of BADL in home environment. Will continue to follow per POC listed below.   Verbal orders provided by Dr. Jeffie Pollock to mobilize patient despite bed rest orders as most current activity orders in chart    Follow Up Recommendations  Home health OT;Supervision/Assistance - 24 hour    Equipment Recommendations  3 in 1 bedside commode    Recommendations for Other Services       Precautions / Restrictions Precautions Precautions: Fall Precaution Comments: [place] Restrictions Weight Bearing Restrictions: No      Mobility Bed Mobility Overal bed mobility: Needs Assistance Bed Mobility: Supine to Sit;Sit to Supine     Supine to sit: Min assist Sit to supine: Min assist   General bed mobility comments: min a for light LE and trunk support to come up to EOB  Transfers Overall transfer level: Needs assistance Equipment used: 2 person hand held assist Transfers: Sit to/from Stand Sit to Stand: Min assist;+2 safety/equipment         General transfer comment: min A to rise and steady, secon person needed for line management     Balance Overall balance assessment: Needs assistance Sitting-balance support: Single extremity supported;Feet supported Sitting balance-[name]: Fair Sitting balance - Comments: min guard for safety   Standing balance support: Bilateral upper extremity supported Standing balance-[name]: Fair Standing balance comment: min A for static standing                           ADL either performed or assessed with clinical judgement   ADL Overall ADL's : Needs assistance/impaired Eating/Feeding: Set up;Sitting Eating/Feeding Details (indicate cue type and reason): cues for pacing Grooming: Set up;Sitting   Upper Body Bathing: Sitting;Set up   Lower Body Bathing: Set up;Sitting/lateral leans;Sit to/from stand   Upper Body Dressing : Set up;Sitting   Lower Body Dressing: Set up;Sitting/lateral leans;Sit to/from stand Lower Body Dressing Details (indicate cue type and reason): sitting on bed, able to cross legs up to don socks without assist Toilet Transfer: Minimal assistance;Ambulation;Regular Toilet;Grab bars;RW;+2 for safety/equipment   Toileting- Clothing Manipulation and Hygiene: Set up;Sit to/from stand   Tub/ Shower Transfer: Minimal assistance;Shower seat;Ambulation;+2 for physical assistance;+2 for safety/equipment   Functional mobility during ADLs: Minimal assistance;+2 for physical assistance;+2 for safety/equipment General ADL Comments: pt overall dconditioned for BADL progression     Vision Patient Visual Report: No change from baseline       Perception     Praxis      Pertinent Vitals/Pain Pain Assessment: No/denies pain     Hand Dominance Right  Extremity/Trunk Assessment Upper Extremity Assessment Upper Extremity Assessment: Generalized weakness   Lower Extremity Assessment Lower Extremity Assessment: Defer to PT evaluation   Cervical / Trunk Assessment Cervical / Trunk Assessment: Normal   Communication  Communication Communication: No difficulties   Cognition Arousal/Alertness: Awake/alert Behavior During Therapy: WFL for tasks assessed/performed Overall Cognitive Status: Impaired/Different from baseline Area of Impairment: Orientation                 Orientation Level: Time             General Comments: cues to get to accurate date, some decreased awareness but pt drowsy at time   General Comments  VSS on 3L [place], swan ganz    Exercises     Shoulder Instructions      Home Living Family/patient expects to be discharged to:: Private residence Living Arrangements: Children Available Help at Discharge: Family;Available 24 hours/day Type of Home: Apartment Home Access: Stairs to enter Entrance Stairs-Number of Steps: flight, no Theatre manager Stairs-Rails: Right Home Layout: One level     Bathroom Shower/Tub: Occupational psychologist: Standard     Home Equipment: None          Prior Functioning/Environment Level of Independence: Independent        Comments: completing BADL independently as well as IADL. Ambulating without equipment        OT Problem List: Decreased strength;Decreased knowledge of use of DME or AE;Decreased knowledge of precautions;Decreased activity tolerance;Cardiopulmonary status limiting activity;Impaired balance (sitting and/or standing)      OT Treatment/Interventions: Self-care/ADL training;Therapeutic exercise;Patient/family education;Balance training;Energy conservation;Therapeutic activities;DME and/or AE instruction    OT Goals(Current goals can be found in the care plan section) Acute Rehab OT Goals Patient Stated Goal: To return to independent PLOF OT Goal Formulation: With patient Time For Goal Achievement: 06/03/19 Potential to Achieve Goals: Good  OT Frequency: Min 2X/week   Barriers to D/C:            Co-evaluation PT/OT/SLP Co-Evaluation/Treatment: Yes Reason for Co-Treatment: Complexity of the  patient's impairments (multi-system involvement);For patient/therapist safety;To address functional/ADL transfers PT goals addressed during session: Mobility/safety with mobility;Balance;Proper use of DME OT goals addressed during session: ADL's and self-care      AM-PAC OT "6 Clicks" Daily Activity     Outcome Measure Help from another person eating meals?: None Help from another person taking care of personal grooming?: None Help from another person toileting, which includes using toliet, bedpan, or urinal?: A Little Help from another person bathing (including washing, rinsing, drying)?: A Little Help from another person to put on and taking off regular upper body clothing?: A Little Help from another person to put on and taking off regular lower body clothing?: A Little 6 Click Score: 20   End of Session Equipment Utilized During Treatment: Oxygen Nurse Communication: Mobility status  Activity Tolerance: Patient tolerated treatment well Patient left: in bed;with call bell/phone within reach  OT Visit Diagnosis: Unsteadiness on feet (R26.81);Other abnormalities of gait and mobility (R26.89);Muscle weakness (generalized) (M62.81)                Time: 6767-2094 OT Time Calculation (min): 28 min Charges:  OT General Charges $OT Visit: 1 Visit OT Evaluation $OT Eval Moderate Complexity: [place], MSOT, OTR/L [facility] Office Number: [phone] Pager: [phone]  Zenovia Jarred 05/20/2019, 1:15 PM

## 2019-05-20 NOTE — Progress Notes (Addendum)
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  Admitted for profound cardiogenic shock. EF now 10% with severe RV dysfunction. Moderate to severe AI. Lactic acid 6.7 on admit. Initial Co-ox 39%. CI 0.84. Placed on milrinone + bicarb gtt.   Initial Swan #s 2/23 RA 16 PA 47/35 (40) PCWP 26 Thermo CO/CI 1.49/0.84 SVR 4132 PVR 8.4 WU CPO 0.30 PAPI 0.75 Co-ox 39%  Remains on  Milrinone 0.5 mcg.  Co-ox improved to 65%. Lactic acid level normalized, 6.7>>5.4>>5.0>>1.5>>1.2. Bicarb gtt off. AMS improving, less confused today.   -1.3L in UOP yesterday after IV Lasix 80 mg x 1. SCr up slightly, 1.86>>1.97. CVP 9  Swan #s today CVP 9 PAP 39/28 CO 3.56 CI 1.89 (Fick calculation 1.99) SVR 1700   Objective:   Weight Range: 64.8 kg Body mass index is 18.85 kg/m.   Vital Signs:   Temp:  [96.6 F (35.9 C)-97.5 F (36.4 C)] 97 F (36.1 C) (02/25 0800) Pulse Rate:  [104] 104 (02/24 1800) Resp:  [13-27] 19 (02/25 0800) BP: (92-134)/(61-100) 105/72 (02/25 0800) SpO2:  [97 %-100 %] 100 % (02/25 0834) Arterial Line BP: (88-128)/(52-83) 99/65 (02/25 0800) Weight:  [64.8 kg] 64.8 kg (02/25 0500) Last BM Date: (UTA)  Weight change: Filed Weights   05/18/19 1845 05/19/19 0500 05/20/19 0500  Weight: 67.4 kg 65.7 kg 64.8 kg    Intake/Output:   Intake/Output Summary (Last 24 hours) at 05/20/2019 0900 Last data filed at 05/20/2019 0800 Gross per 24 hour  Intake 712.26 ml  Output 1355 ml  Net -642.74 ml      Physical Exam    General:  Chronically ill/ cachetic appearing AAF, looks much older than actual age. On [place] but no increased resp difficulty.  HEENT:  Normal Neck: Supple. Elevated JVP .+ Rt IJ swan ganz cath. Carotids 2+ bilat; no bruits. No lymphadenopathy or thyromegaly appreciated. Cor: PMI nondisplaced. Irregular rhythm (PVCs), tachy rate. 2/6 murmur Lungs: Clear Abdomen: Soft, nontender, nondistended. No hepatosplenomegaly. No bruits or masses. Good bowel sounds. Extremities: No cyanosis, clubbing, rash, edema Neuro: A&Ox3    Telemetry   Sinus tach 110s w/ frequent PVCs and brief runs of NSVT   EKG    No new EKG to review   Labs    CBC Recent Labs    05/18/19 2126 05/18/19 2133 05/19/19 0204 05/19/19 0547  WBC 9.3  --  10.8*  --   HGB 12.7   < > 12.1 13.6  HCT 38.1   < > 36.7 40.0  MCV 77.8*  --  77.1*  --   PLT 74*  --  73*  --    < > = values in this interval not displayed.   Basic Metabolic Panel Recent Labs    05/19/19 0204 05/19/19 1510 05/19/19 1559 05/20/19 0227  NA   < >  --  136 139  K   < >  --  3.4* 3.8  CL   < >  --  100 101  CO2   < >  --  26 26  GLUCOSE   < >  --  105* 105*  BUN   < >  --  34* 35*  CREATININE   < >  --  1.86* 1.97*  CALCIUM   < >  --  8.6* 8.6*  MG  --  1.5*  --  2.4   < > = values in this interval not displayed.   Liver Function Tests Recent Labs    05/19/19 0204  AST 31  ALT 24  ALKPHOS 75  BILITOT 3.5*  PROT 5.2*  ALBUMIN 2.9*   No results for input(s): LIPASE, AMYLASE in the last 72 hours. Cardiac Enzymes No results for input(s): CKTOTAL, CKMB, CKMBINDEX, TROPONINI in the last 72 hours.  BNP: BNP (last 3 results) Recent Labs    05/18/19 1707  BNP >4,500.0*    ProBNP (last 3 results) No results for input(s): PROBNP in the last 8760 hours.   D-Dimer No results for input(s): DDIMER in the last 72 hours. Hemoglobin A1C Recent Labs    05/18/19 2126  HGBA1C 6.2*   Fasting Lipid Panel No results for input(s): CHOL, HDL, LDLCALC, TRIG, CHOLHDL, LDLDIRECT in the last 72 hours. Thyroid Function Tests Recent Labs    05/18/19 2126  TSH 1.790    T3FREE 1.9*    Other results:   Imaging    No results found.   Medications:     Scheduled Medications: . aspirin  81 mg Oral Daily  . atorvastatin  80 mg Oral Daily  . Chlorhexidine Gluconate Cloth  6 each Topical Daily  . clopidogrel  75 mg Oral Daily  . cosyntropin  0.25 mg Intravenous Once  . enoxaparin (LOVENOX) injection  30 mg Subcutaneous Q24H  . ezetimibe  10 mg Oral Daily  . feeding supplement (ENSURE ENLIVE)  237 mL Oral TID BM  . fluticasone  1 puff Inhalation Daily  . gabapentin  200 mg Oral TID  . multivitamin with minerals  1 tablet Oral Daily  . sodium chloride flush  10-40 mL Intracatheter Q12H  . sodium chloride flush  3 mL Intravenous Once  . sodium chloride flush  3 mL Intravenous Q12H    Infusions: . sodium chloride Stopped (05/19/19 1732)  . magnesium sulfate bolus IVPB Stopped (05/19/19 1903)  . milrinone 0.5 mcg/kg/min (05/20/19 0800)  . norepinephrine (LEVOPHED) Adult infusion Stopped (05/18/19 2352)    PRN Medications: sodium chloride, acetaminophen, albuterol, ALPRAZolam, cyclobenzaprine, ondansetron (ZOFRAN) IV, polyethylene glycol, sodium chloride flush, sodium chloride flush, zolpidem    Assessment/Plan   1. Cardiogenic shock with sever biventricular dysfunction - Previous EF 40% in 10/18 - Echo in ER EF 10% with severe RV dysfunction. Moderate to severe AI - Suspect acute viral myocarditis (recent viral infection) - initial lactate > 6.7 - initial swan #s w/ low output and elevated filling pressures: CO/CI 1.49/0.84, RA 16, PCWP 26, PAPi 0.74. Co-ox 39% - on Milrinone 0.5 mcg. Co-ox improved to 65%. CI 1.89 by Thermo/ 1.99 by Fick  - Lactic acid now down to 1.2. bicarb gtt off - 1.3L in UOP after 80 IV Lasix x 1. SCr up slightly, 1.86>>1.97 - CVP 5. Hold lasix today  - Continue milrinone 0.5 mcg and follow co-ox - add Bidil 1/2 tablet tid  - Will remove swan ganz cath and place PICC line - Monitor on tele w/ milrinone and  start IV amio if ventricular ectopy worsens - not candidate for mechanical support with PAD and severe AI - will need to involve palliative care to discuss [place] w/ family. Currently Full Code.   2. CAD - s/p CABG with LIMA to LAD and  MVR (bioprosthetic) - Trop 407 -> 387->492. Denies CP - doubt ACS - continue asa, Plavix + statin   3. Severe AI - consider TEE - BCx NGTD  4. Lung cancer s/p resection - stable  5. Severe PAD - s/p left fem-pop and L CEA - bilateral RAS s/p stentig  6. AKI - due to shock - SCr trend 1.9>>2.17>>1.86>>1.97 - Continue milrinone.   7. AMS - likely due to shock  - improving. More alert today. Ox3  8. Hypokalemia:  - improved. K 3.8 today - will supp to keep > 4.0 given frequent ventricular ectopy on milrinone  Length of Stay: 2  Brittainy Simmons, PA-C  05/20/2019, 9:00 AM  Advanced Heart Failure Team Pager [phone] (M-F; [place])  Please contact [facility] for night-coverage after hours (4p -7a ) and weekends on [email or web address]  Agree with above.   Remains on milrinone 0.5. Much more alert and interactive today. Luiz Blare numbers reviewed personally. Cardiac output improved but still quite marginal. SVR 1700. CVP 5. Frequent PVCs  General:  Elderly frail. More alert. . No resp difficulty HEENT: normal Neck: supple. RIJ swan Carotids 2+ bilat; no bruits. No lymphadenopathy or thryomegaly appreciated. Cor: PMI nondisplaced. Regular rate & rhythm. + s3 Lungs: clear Abdomen: soft, nontender, nondistended. No hepatosplenomegaly. No bruits or masses. Good bowel sounds. Extremities: no cyanosis, clubbing, rash, edema Neuro: alert & orientedx3, cranial nerves grossly intact. moves all 4 extremities w/o difficulty. Affect pleasant  She remains quite tenuous but improved on milrinone. Suspect severe viral CM. Continue milrinone. Add low-dose Bidil. Keep K. 4.0 Mg 2.0. If ectopy persists can add amio. Ideally would get cath and cMRI but renal  failure may prohibit. Can pull Swan. Place PICC (not HD candidate). Leave Foley one more day.   CRITICAL CARE Performed by: Glori Bickers  Total critical care time: 35 minutes  Critical care time was exclusive of separately billable procedures and treating other patients.  Critical care was necessary to treat or prevent imminent or life-threatening deterioration.  Critical care was time spent personally by me (independent of midlevel providers or residents) on the following activities: development of treatment plan with patient and/or surrogate as well as nursing, discussions with consultants, evaluation of patient's response to treatment, examination of patient, obtaining history from patient or surrogate, ordering and performing treatments and interventions, ordering and review of laboratory studies, ordering and review of radiographic studies, pulse oximetry and re-evaluation of patient's condition.  Glori Bickers, MD  10:28 AM

## 2019-05-20 NOTE — Progress Notes (Signed)
Dr. Linna Hoff would like to leave foley in one more day to monitor kidney function.

## 2019-05-20 NOTE — Progress Notes (Signed)
Peripherally Inserted Central Catheter/Midline Placement  The IV Nurse has discussed with the patient and/or persons authorized to consent for the patient, the purpose of this procedure and the potential benefits and risks involved with this procedure.  The benefits include less needle sticks, lab draws from the catheter, and the patient may be discharged home with the catheter. Risks include, but not limited to, infection, bleeding, blood clot (thrombus formation), and puncture of an artery; nerve damage and irregular heartbeat and possibility to perform a PICC exchange if needed/ordered by physician.  Alternatives to this procedure were also discussed.  Bard Power PICC patient education guide, fact sheet on infection prevention and patient information card has been provided to patient /or left at bedside.    PICC/Midline Placement Documentation  PICC Double Lumen 08/65/78 PICC Left Basilic 44 cm 0 cm (Active)  Indication for Insertion or Continuance of Line Prolonged intravenous therapies 05/20/19 1700  Exposed Catheter (cm) 0 cm 05/20/19 1700  Site Assessment Clean;Dry;Intact 05/20/19 1700  Lumen #1 Status Flushed;Blood return noted 05/20/19 1700  Lumen #2 Status Flushed;Blood return noted 05/20/19 1700  Dressing Type Transparent 05/20/19 1700  Dressing Status Clean;Dry;Intact;Antimicrobial disc in place 05/20/19 1700  Dressing Change Due 05/27/19 05/20/19 1700       Jule Economy Horton 05/20/2019, 5:13 PM

## 2019-05-21 LAB — COOXEMETRY PANEL
Carboxyhemoglobin: 1.4 % (ref 0.5–1.5)
Methemoglobin: 1 % (ref 0.0–1.5)
O2 Saturation: 57 %
Total hemoglobin: 10.8 g/dL — ABNORMAL LOW (ref 12.0–16.0)

## 2019-05-21 LAB — ACTH STIMULATION, 3 TIME POINTS
Cortisol, 30 Min: 37.4 ug/dL
Cortisol, 60 Min: 53.4 ug/dL
Cortisol, Base: 27.4 ug/dL

## 2019-05-21 LAB — CBC
HCT: 36.2 % (ref 36.0–46.0)
Hemoglobin: 12 g/dL (ref 12.0–15.0)
MCH: 25.5 pg — ABNORMAL LOW (ref 26.0–34.0)
MCHC: 33.1 g/dL (ref 30.0–36.0)
MCV: 76.9 fL — ABNORMAL LOW (ref 80.0–100.0)
Platelets: 57 10*3/uL — ABNORMAL LOW (ref 150–400)
RBC: 4.71 MIL/uL (ref 3.87–5.11)
RDW: 21.2 % — ABNORMAL HIGH (ref 11.5–15.5)
WBC: 11.6 10*3/uL — ABNORMAL HIGH (ref 4.0–10.5)
nRBC: 1.2 % — ABNORMAL HIGH (ref 0.0–0.2)

## 2019-05-21 LAB — BASIC METABOLIC PANEL
Anion gap: 12 (ref 5–15)
BUN: 36 mg/dL — ABNORMAL HIGH (ref 8–23)
CO2: 27 mmol/L (ref 22–32)
Calcium: 8.8 mg/dL — ABNORMAL LOW (ref 8.9–10.3)
Chloride: 97 mmol/L — ABNORMAL LOW (ref 98–111)
Creatinine, Ser: 1.71 mg/dL — ABNORMAL HIGH (ref 0.44–1.00)
GFR calc Af Amer: 36 mL/min — ABNORMAL LOW (ref >60)
GFR calc non Af Amer: 31 mL/min — ABNORMAL LOW (ref >60)
Glucose, Bld: 103 mg/dL — ABNORMAL HIGH (ref 70–99)
Potassium: 4.2 mmol/L (ref 3.5–5.1)
Sodium: 136 mmol/L (ref 135–145)

## 2019-05-21 LAB — MAGNESIUM: Magnesium: 1.8 mg/dL (ref 1.7–2.4)

## 2019-05-21 LAB — DIC (DISSEMINATED INTRAVASCULAR COAGULATION)PANEL
D-Dimer, Quant: 13.76 ug/mL-FEU — ABNORMAL HIGH (ref 0.00–0.50)
Fibrinogen: 264 mg/dL (ref 210–475)
INR: 1.3 — ABNORMAL HIGH (ref 0.8–1.2)
Platelets: 57 10*3/uL — ABNORMAL LOW (ref 150–400)
Prothrombin Time: 15.7 seconds — ABNORMAL HIGH (ref 11.4–15.2)
Smear Review: NONE SEEN
aPTT: 40 seconds — ABNORMAL HIGH (ref 24–36)

## 2019-05-21 LAB — BRAIN NATRIURETIC PEPTIDE: B Natriuretic Peptide: 1490.6 pg/mL — ABNORMAL HIGH (ref 0.0–100.0)

## 2019-05-21 MED ORDER — MAGNESIUM SULFATE 2 GM/50ML IV SOLN
2.0000 g | Freq: Once | INTRAVENOUS | Status: AC
  Administered 2019-05-21: 16:00:00 2 g via INTRAVENOUS
  Filled 2019-05-21: qty 50

## 2019-05-21 MED ORDER — AMIODARONE HCL IN DEXTROSE 360-4.14 MG/200ML-% IV SOLN
60.0000 mg/h | INTRAVENOUS | Status: DC
  Administered 2019-05-21 (×2): 60 mg/h via INTRAVENOUS
  Filled 2019-05-21: qty 200

## 2019-05-21 MED ORDER — AMIODARONE LOAD VIA INFUSION
150.0000 mg | Freq: Once | INTRAVENOUS | Status: AC
  Administered 2019-05-21: 10:00:00 150 mg via INTRAVENOUS
  Filled 2019-05-21: qty 83.34

## 2019-05-21 MED ORDER — ISOSORB DINITRATE-HYDRALAZINE 20-37.5 MG PO TABS: 1.0000 | ORAL_TABLET | Freq: Three times a day (TID) | ORAL | Status: DC

## 2019-05-21 MED ORDER — AMIODARONE HCL IN DEXTROSE 360-4.14 MG/200ML-% IV SOLN
30.0000 mg/h | INTRAVENOUS | Status: DC
  Administered 2019-05-21 – 2019-05-25 (×8): 30 mg/h via INTRAVENOUS
  Filled 2019-05-21 (×11): qty 200

## 2019-05-21 MED ORDER — COSYNTROPIN 0.25 MG IJ SOLR
0.2500 mg | Freq: Once | INTRAMUSCULAR | Status: AC
  Administered 2019-05-21: 07:00:00 0.25 mg via INTRAVENOUS
  Filled 2019-05-21: qty 0.25

## 2019-05-21 MED ORDER — ISOSORB DINITRATE-HYDRALAZINE 20-37.5 MG PO TABS
0.5000 | ORAL_TABLET | Freq: Three times a day (TID) | ORAL | Status: DC
  Administered 2019-05-21: 0.5 via ORAL
  Filled 2019-05-21: qty 1

## 2019-05-21 MED ORDER — ISOSORB DINITRATE-HYDRALAZINE 20-37.5 MG PO TABS
1.0000 | ORAL_TABLET | Freq: Three times a day (TID) | ORAL | Status: DC
  Administered 2019-05-21 (×2): 1 via ORAL
  Filled 2019-05-21 (×2): qty 1

## 2019-05-21 MED ORDER — ISOSORB DINITRATE-HYDRALAZINE 20-37.5 MG PO TABS
0.5000 | ORAL_TABLET | Freq: Once | ORAL | Status: DC
  Filled 2019-05-21: qty 1

## 2019-05-21 NOTE — Consult Note (Signed)
Consultation Note Date: 05/21/2019   Patient Name: Leah [name]  DOB: 10-Dec-1950  MRN: [id number]  Age / Sex: 69 y.o., female  PCP: Guadalupe Dawn, MD Referring Physician: Jolaine Artist, MD  Reason for Consultation: Establishing goals of care  HPI/Patient Profile: 69 y.o. female  with past medical history of systolic CHF (EF 22% 4825), CAD s/p stenting 2016, mitral valve replacement 2016, HTN, HLD, COPD, tobacco use, lung adenocarcinoma stage 1 s/p resection 2018, PVD/PAD admitted on 05/18/2019 with cardiogenic shock and EF decreased to 10%, severe RV dysfunction, severe aortic insufficiency as well with potential acute viral myocarditis or even giant cell myocarditis per heart failure team. Required ICU admission with milrinone and bicarb infusion.   Clinical Assessment and Goals of Care: I have reviewed records for Ms. [place]. I came to her bedside and she is sleeping. She does arouse to voice. She is able to tell me that she is at "[facility]" and that her son made her come. She is unable to tell me any details about her health situation. She struggles to stay awake and speech is delayed. She gives me permission to call her children.   I called and spoke with son, [name]. [name] has been coming to visit with her in the evenings and understands that her heart function has declined severely. He tells me that he was told when she came in her chances were "50/50." We discussed the importance of considering having a conversation to discuss scenarios and how we should best care for his mother if she were to worsen. He agrees and I will meet with Ms. Topete's 4 adult children tomorrow 05/22/19 at noon.   All questions/concerns addressed. Emotional support provided.   Primary Decision Maker NEXT OF KIN 4 adult children    SUMMARY OF RECOMMENDATIONS   - [place] meeting with family 05/22/19 1200  pm.   Code Status/Advance Care Planning:  Full code   Symptom Management:   Per heart failure and PCCM.   Palliative Prophylaxis:   Aspiration, Bowel Regimen, Delirium Protocol, Frequent Pain Assessment, Oral Care and Turn Reposition   Psycho-social/Spiritual:   Desire for further Chaplaincy support:no  Prognosis:   Overall prognosis poor with severe functional decline and new decline EF 10%.   Discharge Planning: To Be Determined      Primary Diagnoses: Present on Admission: . AMS (altered mental status) . Cardiogenic shock ([place]) . Moderate aortic regurgitation   I have reviewed the medical record, interviewed the patient and family, and examined the patient. The following aspects are pertinent.  Past Medical History:  Diagnosis Date  . Anemia   . Bronchogenic lung cancer, right ([place]) 11/29/2016  . CAD (coronary artery disease)    a. s/p LIMA-LAD in 11/2014  . Carotid artery occlusion   . CHF (congestive heart failure) ([place])   . Complication of anesthesia    slow to awaken x 1 maybe 2001  . COPD (chronic obstructive pulmonary disease) ([place])   . GERD (gastroesophageal reflux disease)    "sometimes"  takes Copywriter, advertising or drinks gingerale   . Headache(784.0)   . History of kidney stones   . Hyperlipidemia   . Hypertension   . Leg pain   . Peripheral vascular disease ([place])   . Renal vascular disease 10/26/2014   bilateral stents placed   . Severe mitral regurgitation    a. s/p MVR in 11/2014 with a pericardial tissue valve  . Shortness of breath dyspnea   . Tobacco abuse    Social History   Socioeconomic History  . Marital status: Divorced    Spouse name: Not on file  . Number of children: 4  . Years of education: Not on file  . Highest education level: Not on file  Occupational History  . Occupation: retired  Tobacco Use  . Smoking status: Current Every Day Smoker    Packs/day: 0.50    Years: 35.00    Pack years: 17.50    Types: Cigarettes  .  Smokeless tobacco: Never Used  Substance and Sexual Activity  . Alcohol use: Yes    Alcohol/week: 0.0 standard drinks    Comment: occasion  . Drug use: No  . Sexual activity: Never  Other Topics Concern  . Not on file  Social History Narrative  . Not on file   Social Determinants of Health   Financial Resource Strain:   . Difficulty of Paying Living Expenses: Not on file  Food Insecurity:   . Worried About Charity fundraiser in the Last Year: Not on file  . Ran Out of Food in the Last Year: Not on file  Transportation Needs:   . Lack of Transportation (Medical): Not on file  . Lack of Transportation (Non-Medical): Not on file  Physical Activity:   . Days of Exercise per Week: Not on file  . Minutes of Exercise per Session: Not on file  Stress:   . Feeling of Stress : Not on file  Social Connections:   . Frequency of Communication with Friends and Family: Not on file  . Frequency of Social Gatherings with Friends and Family: Not on file  . Attends Religious Services: Not on file  . Active Member of Clubs or Organizations: Not on file  . Attends Archivist Meetings: Not on file  . Marital Status: Not on file   Family History  Problem Relation Age of Onset  . Cancer Mother        BRAIN AND LUNG  . Hypertension Mother   . Hypertension Father   . Heart disease Father   . Prostate cancer Father   . Kidney disease Father   . Hypertension Brother   . ALS Brother   . Stroke Paternal Aunt        great aunt  . Heart attack Neg Hx   . Colon cancer Neg Hx   . Stomach cancer Neg Hx   . Rectal cancer Neg Hx   . Esophageal cancer Neg Hx   . Liver cancer Neg Hx    Scheduled Meds: . aspirin  81 mg Oral Daily  . atorvastatin  80 mg Oral Daily  . Chlorhexidine Gluconate Cloth  6 each Topical Daily  . clopidogrel  75 mg Oral Daily  . enoxaparin (LOVENOX) injection  30 mg Subcutaneous Q24H  . ezetimibe  10 mg Oral Daily  . feeding supplement (ENSURE ENLIVE)  237 mL  Oral TID BM  . fluticasone  1 puff Inhalation Daily  . gabapentin  200 mg Oral TID  .  isosorbide-hydrALAZINE  0.5 tablet Oral Once  . isosorbide-hydrALAZINE  1 tablet Oral TID  . multivitamin with minerals  1 tablet Oral Daily  . sodium chloride flush  10-40 mL Intracatheter Q12H  . sodium chloride flush  10-40 mL Intracatheter Q12H  . sodium chloride flush  3 mL Intravenous Once  . sodium chloride flush  3 mL Intravenous Q12H   Continuous Infusions: . sodium chloride Stopped (05/19/19 1732)  . amiodarone 60 mg/hr (05/21/19 0086)   Followed by  . amiodarone    . magnesium sulfate bolus IVPB Stopped (05/19/19 1903)  . milrinone 0.375 mcg/kg/min (05/21/19 1013)  . norepinephrine (LEVOPHED) Adult infusion Stopped (05/18/19 2352)   PRN Meds:.sodium chloride, acetaminophen, albuterol, ALPRAZolam, cyclobenzaprine, ondansetron (ZOFRAN) IV, polyethylene glycol, sodium chloride flush, sodium chloride flush, sodium chloride flush, zolpidem Allergies  Allergen Reactions  . Lisinopril Swelling    Angioedema 06/10/11  . Chantix [Varenicline] Other (See Comments)    Caused insomnia  . Penicillins Hives    Did it involve swelling of the face/tongue/throat, SOB, or low BP? No Did it involve sudden or severe rash/hives, skin peeling, or any reaction on the inside of your mouth or nose? No Did you need to seek medical attention at a hospital or doctor's office? No When did it last happen?50 Years If all above answers are "NO", may proceed with cephalosporin use.     Review of Systems  Unable to perform ROS: Acuity of condition    Physical Exam Vitals and nursing note reviewed.  Constitutional:      Appearance: She is cachectic. She is ill-appearing.     Comments: Thin, frail.   Cardiovascular:     Rate and Rhythm: Tachycardia present.  Pulmonary:     Effort: Pulmonary effort is normal. No tachypnea, accessory muscle usage or respiratory distress.  Abdominal:     General: Abdomen  is flat.     Palpations: Abdomen is soft.  Neurological:     Mental Status: She is alert and easily aroused.     Comments: Oriented to person and place but not to situation     Vital Signs: BP 126/70   Pulse (!) 126   Temp (!) 97.4 F (36.3 C) (Oral)   Resp 14   Ht 6\' 1"  (1.854 m)   Wt 61.5 kg   SpO2 99%   BMI 17.89 kg/m  Pain Scale: 0-10   Pain Score: Asleep   SpO2: SpO2: 99 % O2 Device:SpO2: 99 % O2 Flow Rate: .O2 Flow Rate (L/min): 4 L/min  IO: Intake/output summary:   Intake/Output Summary (Last 24 hours) at 05/21/2019 1311 Last data filed at 05/21/2019 0700 Gross per 24 hour  Intake 241.2 ml  Output 345 ml  Net -103.8 ml    LBM: Last BM Date: (UTA) Baseline Weight: Weight: 67.4 kg Most recent weight: Weight: 61.5 kg     Palliative Assessment/Data:     Time In: 1400 Time Out: 1440 Time Total: 40 min Greater than 50%  of this time was spent counseling and coordinating care related to the above assessment and plan.  Signed by: Vinie Sill, NP Palliative Medicine Team Pager # [phone] (M-F 8a-5p) Team Phone # [phone] (Nights/Weekends)

## 2019-05-21 NOTE — Plan of Care (Signed)
  Problem: Nutrition: Goal: Adequate nutrition will be maintained Outcome: Not Progressing  Pt with minimal PO intake. On supplements Problem: Pain Managment: Goal: General experience of comfort will improve Outcome: Progressing  Pt denies pain

## 2019-05-21 NOTE — Progress Notes (Signed)
  Amiodarone Drug - Drug Interaction Consult Note  Recommendations: None  Amiodarone is metabolized by the cytochrome P450 system and therefore has the potential to cause many drug interactions. Amiodarone has an average plasma half-life of 50 days (range 20 to 100 days).   There is potential for drug interactions to occur several weeks or months after stopping treatment and the onset of drug interactions may be slow after initiating amiodarone.   [x]  Statins: Increased risk of myopathy. Simvastatin- restrict dose to 20mg  daily. Other statins: counsel patients to report any muscle pain or weakness immediately.  [x]  Anticoagulants: Amiodarone can increase anticoagulant effect. Consider warfarin dose reduction. Patients should be monitored closely and the dose of anticoagulant altered accordingly, remembering that amiodarone levels take several weeks to stabilize.  []  Antiepileptics: Amiodarone can increase plasma concentration of phenytoin, the dose should be reduced. Note that small changes in phenytoin dose can result in large changes in levels. Monitor patient and counsel on signs of toxicity.  []  Beta blockers: increased risk of bradycardia, AV block and myocardial depression. Sotalol - avoid concomitant use.  []   Calcium channel blockers (diltiazem and verapamil): increased risk of bradycardia, AV block and myocardial depression.  []   Cyclosporine: Amiodarone increases levels of cyclosporine. Reduced dose of cyclosporine is recommended.  []  Digoxin dose should be halved when amiodarone is started.  []  Diuretics: increased risk of cardiotoxicity if hypokalemia occurs.  []  Oral hypoglycemic agents (glyburide, glipizide, glimepiride): increased risk of hypoglycemia. Patient's glucose levels should be monitored closely when initiating amiodarone therapy.   []  Drugs that prolong the QT interval:  Torsades de pointes risk may be increased with concurrent use - avoid if possible.  Monitor  QTc, also keep magnesium/potassium WNL if concurrent therapy can't be avoided. Marland Kitchen Antibiotics: e.g. fluoroquinolones, erythromycin. . Antiarrhythmics: e.g. quinidine, procainamide, disopyramide, sotalol. . Antipsychotics: e.g. phenothiazines, haloperidol.  . Lithium, tricyclic antidepressants, and methadone. Thank You,  Leah [name]  05/21/2019 9:15 AM

## 2019-05-21 NOTE — Plan of Care (Signed)

## 2019-05-21 NOTE — Progress Notes (Signed)
Physical Therapy Treatment Patient Details Name: Leah [name] MRN: [id number] DOB: 02/07/1951 Today's Date: 05/21/2019    History of Present Illness 69 y.o. 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 Pt brought to the ED for AMS, now with concern for cardiogenic shock.    PT Comments    Pt tolerated treatment well despite some fatigue. Pt with knee buckling during standing and ambulation, requiring minA to correct. Pt ambulating slowly and requiring physical assistance at this time to prevent falls. Pt will continue to benefit from aggressive mobilization as HR and vital signs will allow, tachy and with 2 brief runs of Vtach during this session. If patient continues to progress slowly over the weekend PT may need to consider changing recommendations to SNF.   Follow Up Recommendations  Home health PT;Supervision/Assistance - 24 hour(may need SNF if progress remains slow)     Equipment Recommendations  Rolling walker with 5" wheels    Recommendations for Other Services       Precautions / Restrictions Precautions Precautions: Fall Precaution Comments: tachy Restrictions Weight Bearing Restrictions: No    Mobility  Bed Mobility                  Transfers Overall transfer level: Needs assistance Equipment used: Rolling walker (2 wheeled) Transfers: Sit to/from Stand Sit to Stand: Mod assist;Min assist         General transfer comment: modA on first attempt with knee buckle, minA for 2nd and 3rd attempts from recliner, PT cues for hand placement and increased width of BOS  Ambulation/Gait Ambulation/Gait assistance: Min assist Gait Distance (Feet): 15 Feet(additional trials of 10 and 8') Assistive device: Rolling walker (2 wheeled) Gait Pattern/deviations: Step-to pattern;Trunk  flexed Gait velocity: reduced Gait velocity interpretation: <1.8 ft/sec, indicate of risk for recurrent falls General Gait Details: intermittent knee buckling, short step to gait, pt with head down toward ground throughout ambulation   Stairs             Wheelchair Mobility    Modified Rankin (Stroke Patients Only)       Balance Overall balance assessment: Needs assistance Sitting-balance support: Bilateral upper extremity supported;Feet supported Sitting balance-[name]: Fair Sitting balance - Comments: close supervision   Standing balance support: Bilateral upper extremity supported Standing balance-[name]: Poor Standing balance comment: minA, intermittent knee buckling                            Cognition Arousal/Alertness: Awake/alert Behavior During Therapy: WFL for tasks assessed/performed Overall Cognitive Status: Impaired/Different from baseline Area of Impairment: Attention;Problem solving                   Current Attention Level: Sustained         Problem Solving: Slow processing        Exercises      General Comments General comments (skin integrity, edema, etc.): Pt tachy from 110s to 139 with mobility, A-fib and 2 brief runs of V-tach noted, pt asymptomatic. Pt on 4L [place] during activity      Pertinent Vitals/Pain Pain Assessment: No/denies pain    Home Living                      Prior Function  PT Goals (current goals can now be found in the care plan section) Acute Rehab PT Goals Patient Stated Goal: To return to independent PLOF Progress towards PT goals: Progressing toward goals(slowly)    Frequency    Min 3X/week      PT Plan Current plan remains appropriate    Co-evaluation              AM-PAC PT "6 Clicks" Mobility   Outcome Measure  Help needed turning from your back to your side while in a flat bed without using bedrails?: A Little Help needed moving from  lying on your back to sitting on the side of a flat bed without using bedrails?: A Little Help needed moving to and from a bed to a chair (including a wheelchair)?: A Little Help needed standing up from a chair using your arms (e.g., wheelchair or bedside chair)?: A Lot Help needed to walk in hospital room?: A Little Help needed climbing 3-5 steps with a railing? : Total 6 Click Score: 15    End of Session Equipment Utilized During Treatment: Oxygen Activity Tolerance: Patient tolerated treatment well Patient left: in chair;with call bell/phone within reach Nurse Communication: Mobility status PT Visit Diagnosis: Unsteadiness on feet (R26.81)     Time: 8250-5397 PT Time Calculation (min) (ACUTE ONLY): 27 min  Charges:  $Gait Training: 8-22 mins $Therapeutic Activity: 8-22 mins                     Zenaida Niece, PT, DPT Acute Rehabilitation Pager: [phone]    Zenaida Niece 05/21/2019, 9:49 AM

## 2019-05-21 NOTE — Progress Notes (Signed)
FPTS Interim Progress Note FPTS Continues to follow [name] socially.  We will gladly receive her when she is able to return to floor status. Until then we appreciate the Heart Failure team and the care they are providing to our mutual patient.  Leah Fee, DO 05/21/2019, 9:36 AM PGY-1, [place] [facility] Service pager [phone]

## 2019-05-21 NOTE — Progress Notes (Addendum)
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  Admitted for profound cardiogenic shock. EF now 10% with severe RV dysfunction. Moderate to severe AI. Lactic acid 6.7 on admit. Initial Co-ox 39%. CI 0.84. Placed on milrinone + bicarb gtt.   Initial Swan #s 2/23 RA 16 PA 47/35 (40) PCWP 26 Thermo CO/CI 1.49/0.84 SVR 4132 PVR 8.4 WU CPO 0.30 PAPI 0.75 Co-ox 39%  Remains on milrinone 0.5 mcg. CO-OX 57%. CVP 5   Denies SOB. Denies CP. Complaining of pain in her bottom.    Objective:   Weight Range: 61.5 kg Body mass index is 17.89 kg/m.   Vital Signs:   Temp:  [96.8 F (36 C)-97.5 F (36.4 C)] 97.5 F (36.4 C) (02/26 0753) Pulse Rate:  [126] 126 (02/26 0803) Resp:  [14-26] 14 (02/26 0803) BP: (70-147)/(52-88) 126/70 (02/26 0500) SpO2:  [98 %-99 %] 99 % (02/25 2000) Arterial Line BP: (100)/(58) 100/58 (02/25 0900) FiO2 (%):  [94 %] 94 % (02/26 0803) Weight:  [61.5 kg] 61.5 kg (02/26 0800) Last BM Date: (UTA)  Weight change: Filed Weights   05/19/19 0500 05/20/19 0500 05/21/19 0800  Weight: 65.7 kg 64.8 kg 61.5 kg    Intake/Output:   Intake/Output Summary (Last 24 hours) at 05/21/2019 0852 Last data filed at 05/21/2019 0700 Gross per 24 hour  Intake 711.82 ml  Output 510 ml  Net 201.82 ml      Physical Exam   CVP 5-6  General: Sitting in the chair.  No resp difficulty HEENT: normal Neck: supple. no JVD. Carotids 2+ bilat; no bruits. No lymphadenopathy or thryomegaly appreciated. Cor: PMI nondisplaced. Irregular rate & rhythm. No rubs, or murmurs. +S3 Lungs: clear Abdomen: soft, nontender, nondistended. No hepatosplenomegaly. No bruits  or masses. Good bowel sounds. Extremities: no cyanosis, clubbing, rash, edema. RUE PICC  Neuro: alert & orientedx3, cranial nerves grossly intact. moves all 4 extremities w/o difficulty. Affect pleasant   Telemetry   Sinus Tach 100s frequent PVCs.    EKG    No new EKG to review   Labs    CBC Recent Labs    05/20/19 0927 05/21/19 0624  WBC 11.1* 11.6*  HGB 12.2 12.0  HCT 38.1 36.2  MCV 77.8* 76.9*  PLT 66* 57*   Basic Metabolic Panel Recent Labs    05/20/19 0227 05/20/19 0227 05/20/19 1407 05/21/19 0624  NA 139   < > 135 136  K 3.8   < > 4.0 4.2  CL 101   < > 99 97*  CO2 26   < > 26 27  GLUCOSE 105*   < > 141* 103*  BUN 35*   < > 37* 36*  CREATININE 1.97*   < > 1.84* 1.71*  CALCIUM 8.6*   < > 8.3* 8.8*  MG 2.4  --  2.0  --    < > = values in this interval not displayed.   Liver Function Tests Recent Labs    05/19/19 0204  AST 31  ALT 24  ALKPHOS 75  BILITOT 3.5*  PROT 5.2*  ALBUMIN 2.9*   No results for input(s): LIPASE, AMYLASE in the last 72 hours. Cardiac  Enzymes No results for input(s): CKTOTAL, CKMB, CKMBINDEX, TROPONINI in the last 72 hours.  BNP: BNP (last 3 results) Recent Labs    05/18/19 1707  BNP >4,500.0*    ProBNP (last 3 results) No results for input(s): PROBNP in the last 8760 hours.   D-Dimer No results for input(s): DDIMER in the last 72 hours. Hemoglobin A1C Recent Labs    05/18/19 2126  HGBA1C 6.2*   Fasting Lipid Panel No results for input(s): CHOL, HDL, LDLCALC, TRIG, CHOLHDL, LDLDIRECT in the last 72 hours. Thyroid Function Tests Recent Labs    05/18/19 2126  TSH 1.790  T3FREE 1.9*    Other results:   Imaging    [place] EKG SITE RITE  Result Date: 05/20/2019 If Site Rite image not attached, placement could not be confirmed due to current cardiac rhythm.    Medications:     Scheduled Medications: . aspirin  81 mg Oral Daily  . atorvastatin  80 mg Oral Daily  . Chlorhexidine Gluconate Cloth  6  each Topical Daily  . clopidogrel  75 mg Oral Daily  . enoxaparin (LOVENOX) injection  30 mg Subcutaneous Q24H  . ezetimibe  10 mg Oral Daily  . feeding supplement (ENSURE ENLIVE)  237 mL Oral TID BM  . fluticasone  1 puff Inhalation Daily  . gabapentin  200 mg Oral TID  . isosorbide-hydrALAZINE  0.5 tablet Oral TID  . multivitamin with minerals  1 tablet Oral Daily  . sodium chloride flush  10-40 mL Intracatheter Q12H  . sodium chloride flush  10-40 mL Intracatheter Q12H  . sodium chloride flush  3 mL Intravenous Once  . sodium chloride flush  3 mL Intravenous Q12H    Infusions: . sodium chloride Stopped (05/19/19 1732)  . magnesium sulfate bolus IVPB Stopped (05/19/19 1903)  . milrinone 0.5 mcg/kg/min (05/21/19 0700)  . norepinephrine (LEVOPHED) Adult infusion Stopped (05/18/19 2352)    PRN Medications: sodium chloride, acetaminophen, albuterol, ALPRAZolam, cyclobenzaprine, ondansetron (ZOFRAN) IV, polyethylene glycol, sodium chloride flush, sodium chloride flush, sodium chloride flush, zolpidem    Assessment/Plan   1. Cardiogenic shock with sever biventricular dysfunction - Previous EF 40% in 10/18 - Echo in ER EF 10% with severe RV dysfunction. Moderate to severe AI - Suspect acute viral myocarditis (recent viral infection) - initial lactate > 6.7 - initial swan #s w/ low output and elevated filling pressures: CO/CI 1.49/0.84, RA 16, PCWP 26, PAPi 0.74. Co-ox 39% - Lactic acid down to 1.2.  - Suspect PVCs playing a role. Add amio  - CVP 5. Volume status stable.  - on Milrinone 0.5 mcg. Co-ox improved to 57%. Cut back milrinone 0.375 mcg.  - Increase Bidil to 1 tablet tid  - not candidate for mechanical support with PAD and severe AI - will need to involve palliative care to discuss [place] w/ family. Currently Full Code.   2. CAD - s/p CABG with LIMA to LAD and MVR (bioprosthetic) - Trop 407 -> 387->492.  - doubt ACS  - No chest pain.  - continue asa, Plavix + statin     3. Severe AI - consider TEE - BCx NGTD  4. Lung cancer s/p resection - stable  5. Severe PAD - s/p left fem-pop and L CEA - bilateral RAS s/p stentig  6. AKI - due to shock - SCr trend 1.9>>2.17>>1.86>>1.97> 1.7  - Continue milrinone.   7. AMS - likely due to shock  - improving. More alert today. Ox3  8. Hypokalemia:  -  improved. K 4.2 today  - will supp to keep > 4.0 given frequent ventricular ectopy on milrinone  9. PVCs - Frequent PVCs. Add amio drip to suppress PVCs.  - K 4.2. Check Mag today.   PT/OT appreciated with recommendations for HHPT/OT.   Length of Stay: 3  Amy Clegg, NP  05/21/2019, 8:52 AM  Advanced Heart Failure Team Pager [phone] (M-F; 7a - 4p)  Please contact [facility] for night-coverage after hours (4p -7a ) and weekends on [email or web address]  Agree with above.    She remains tenuous on milrinone 0.5. Co-ox 57%. Having very frequent PVCs. CVP 5. Very weak. Up to chair. Creatinine improved slightly to 1.7  Elderly weak/frail HEENT: normal Neck: supple. no JVD. Carotids 2+ bilat; no bruits. No lymphadenopathy or thryomegaly appreciated. Cor: PMI nondisplaced. Irregular rate & rhythm. No rubs, gallops or murmurs. Lungs: clear Abdomen: soft, nontender, nondistended. No hepatosplenomegaly. No bruits or masses. Good bowel sounds. Extremities: no cyanosis, clubbing, rash, edema cachetic  Neuro: alert & orientedx3, cranial nerves grossly intact. moves all 4 extremities w/o difficulty. Affect pleasant  Shock improved with milrinone but still very tenuous. By history and troponins, I suspect likely viral myocarditis. With high burden of PVCs, I worry about possible giant cell myocarditis but has not had VT or atrial arrhythmias. Will wean milrinone to 0.375. Increase Bidil. Supp K and mag. PT/OT input appreciated.   Ideally will need coronary angio and cMRI +/- TEE to reassess AI but will need to see what her clinical course does first. May be able  to go to [facility] soon.   Glori Bickers, MD  10:26 AM

## 2019-05-21 NOTE — Progress Notes (Signed)
NAME:  Leah [name], MRN:  [id number], DOB:  08/27/1950, LOS: 3 ADMISSION DATE:  05/18/2019, CHIEF COMPLAINT:  Cardiogenic Shock   Brief History   Leah [name] is a 69 yo F w/ PMHx of CAD s/p CABG and MV replacement in 2016, HTN, HLD, PVD s/p left fem-pop and L CEA and s/p bilateral RAS stenting, HFpEF, CKD, and bronchogenic lung cancer s/p resection who presented on 2/23 with AMS.  Her last known normal was 2/22.  She was found by her son lethargic and largely unresponsive on 2/23 and was subsequently brought to the ER.  She later remembered sitting down b/c she was tired and having chest pain.  Patient was found to be in cardiogenic shock and started on milrinone.  AMS has improved.  Past Medical History   Past Medical History:  Diagnosis Date  . Anemia   . Bronchogenic lung cancer, right ([place]) 11/29/2016  . CAD (coronary artery disease)    a. s/p LIMA-LAD in 11/2014  . Carotid artery occlusion   . CHF (congestive heart failure) ([place])   . Complication of anesthesia    slow to awaken x 1 maybe 2001  . COPD (chronic obstructive pulmonary disease) ([place])   . GERD (gastroesophageal reflux disease)    "sometimes" takes Copywriter, advertising or drinks gingerale   . Headache(784.0)   . History of kidney stones   . Hyperlipidemia   . Hypertension   . Leg pain   . Peripheral vascular disease ([place])   . Renal vascular disease 10/26/2014   bilateral stents placed   . Severe mitral regurgitation    a. s/p MVR in 11/2014 with a pericardial tissue valve  . Shortness of breath dyspnea   . Tobacco abuse      Significant Hospital Events   2/23 Admission  Consults:  Heart failure Speech and Language PT/OT  Procedures:    Significant Diagnostic Tests:  2/23 CT head - no acute findings 2/23 CT chest/abd/pelvis - no change in AAA 2/23 CXR - cardiomegaly 2/23 EKG - sinus tachy, R atrial enlargment, L axis deviation, L ventricular hypertrophy, T wave abnormality, consider lateral ischemia,  can't rule out septal infarct 2/23 BNP - >4500  Micro Data:  2/24 MRSA neg 2/23 UA neg for nitrites, leuk est 2/23 neg for covid, influenza A &B 2/23 blood cultures NGTD (<24 hours)  Antimicrobials:  None   Interim history/subjective:  Patient reports that she is feeling more sluggish this morning.  Remains on Milrinone .31mcg.  Objective   Blood pressure 126/70, pulse (!) 126, temperature (!) 97.5 F (36.4 C), temperature source Oral, resp. rate 14, height 6\' 1"  (1.854 m), weight 61.5 kg, SpO2 99 %. PAP: (25-47)/(17-38) 47/38 CVP:  [3 mmHg-4 mmHg] 4 mmHg CO:  [4.1 L/min] 4.1 L/min CI:  [2.2 L/min/m2] 2.2 L/min/m2  FiO2 (%):  [94 %] 94 %   Intake/Output Summary (Last 24 hours) at 05/21/2019 0951 Last data filed at 05/21/2019 0700 Gross per 24 hour  Intake 401.78 ml  Output 435 ml  Net -33.22 ml   Filed Weights   05/19/19 0500 05/20/19 0500 05/21/19 0800  Weight: 65.7 kg 64.8 kg 61.5 kg    Examination: General: chronically ill-appearing woman sitting up in chair, in NAD HENT: trachea midline, MMM Lungs: CTAB  Cardiovascular: irregular rate and rhythm, tachycardic Abdomen: soft, non-distended, non-tender Extremities: warm and dry, no edema Neuro: alert and oriented Skin: no rashes  Resolved Hospital Problem list   AMS Hypokalemia  Assessment & Plan:  Cardiogenic Shock 2/2 Severe Ventricular Dysfunction: Echo in ER with EF of 10% (from 40% in 12/2016) and severe RV dysfunction.  Swan in place.  Not a candidate for mechanical support with PAD and severe AI.   - continue milrinone - start IV amio given continued ventricular ectopy - start bidil - consider eventual coronary angio if improving - palliative care consult to discuss [place] w/ family - goal K+ >4  Aortic Insuffiency: moderate to severe on Echo in ER.  Blood cultures NGTD (2 days).   - per HF, may consider TEE if patient improving  Dysphagia: Pt reports difficulty swallowing and weight loss (65.7kg on  admission from 72.1kg in July 2020).  Patient denies history of radiation with lung cancer. - Dysphagia 3 Diet - consider EGD when patient is stronger  Thrombocytopenia: Platelets continue to downtrend to 57 today (2/26) from 90 on admission (2/23) and normal platelets prior to admission (187 on 04/29/19).  Likely thrombocytopenia 2/2 critical illness.   - DIC panel  AoCKD: Last BMP prior to admission was 1.78 on 2/11.  AKI likely 2/2 shock.  Creatinine down-trending.  - daily BMP  Lung Cancer s/p Resection (no radiation per pt): Stable.   Severe PAD: s/p left fem-pop and L CEA, bilateral RAS s/p stenting  Best practice:  Diet: Dysphagia 3 diet Pain/Anxiety/Delirium protocol (if indicated): alprazolam .25mg  bid prn VAP protocol (if indicated): n/a DVT prophylaxis: lovenox + SCDs GI prophylaxis: n/a Glucose control: daily BMP Mobility: bed rest Code Status: Full Family Communication: To be contacted Disposition: ICU  Labs   CBC: Recent Labs  Lab 05/18/19 1343 05/18/19 1343 05/18/19 2126 05/18/19 2133 05/19/19 0103 05/19/19 0204 05/19/19 0547 05/20/19 0927 05/21/19 0624  WBC 9.4  --  9.3  --   --  10.8*  --  11.1* 11.6*  HGB 13.6   < > 12.7   < > 14.3 12.1 13.6 12.2 12.0  HCT 43.0   < > 38.1   < > 42.0 36.7 40.0 38.1 36.2  MCV 80.4  --  77.8*  --   --  77.1*  --  77.8* 76.9*  PLT 90*  --  74*  --   --  73*  --  66* 57*   < > = values in this interval not displayed.    Basic Metabolic Panel: Recent Labs  Lab 05/18/19 2125 05/18/19 2126 05/19/19 0204 05/19/19 0331 05/19/19 0547 05/19/19 1510 05/19/19 1559 05/20/19 0227 05/20/19 1407 05/21/19 0624  NA  --    < > 139   < > 140  --  136 139 135 136  K  --    < > 2.8*   < > 3.9  --  3.4* 3.8 4.0 4.2  CL  --    < > 101  --   --   --  100 101 99 97*  CO2  --   --  24  --   --   --  26 26 26 27   GLUCOSE  --    < > 182*  --   --   --  105* 105* 141* 103*  BUN  --    < > 35*  --   --   --  34* 35* 37* 36*  CREATININE   --    < > 2.17*  --   --   --  1.86* 1.97* 1.84* 1.71*  CALCIUM  --   --  8.4*  --   --   --  8.6* 8.6* 8.3* 8.8*  MG 1.8  --   --   --   --  1.5*  --  2.4 2.0  --    < > = values in this interval not displayed.   GFR: Estimated Creatinine Clearance: 31.4 mL/min (A) (by C-G formula based on SCr of 1.71 mg/dL (H)). Recent Labs  Lab 05/18/19 2126 05/18/19 2330 05/19/19 0204 05/19/19 0723 05/19/19 1013 05/20/19 0927 05/21/19 0624  WBC 9.3  --  10.8*  --   --  11.1* 11.6*  LATICACIDVEN 5.4* 5.0*  --  1.5 1.2  --   --     Liver Function Tests: Recent Labs  Lab 05/19/19 0204  AST 31  ALT 24  ALKPHOS 75  BILITOT 3.5*  PROT 5.2*  ALBUMIN 2.9*   No results for input(s): LIPASE, AMYLASE in the last 168 hours. Recent Labs  Lab 05/18/19 1544  AMMONIA 18    ABG    Component Value Date/Time   PHART 7.460 (H) 05/19/2019 0547   PCO2ART 38.3 05/19/2019 0547   PO2ART 85.0 05/19/2019 0547   HCO3 27.2 05/19/2019 0547   TCO2 28 05/19/2019 0547   ACIDBASEDEF 3.0 (H) 05/18/2019 2320   O2SAT 57.0 05/21/2019 0818     Coagulation Profile: Recent Labs  Lab 05/19/19 0204  INR 1.8*    Cardiac Enzymes: No results for input(s): CKTOTAL, CKMB, CKMBINDEX, TROPONINI in the last 168 hours.  HbA1C: Hemoglobin A1C  Date/Time Value Ref Range Status  01/15/2016 11:15 AM 5.6  Final   Hgb A1c MFr Bld  Date/Time Value Ref Range Status  05/18/2019 09:26 PM 6.2 (H) 4.8 - 5.6 % Final    Comment:    (NOTE) Pre diabetes:          5.7%-6.4% Diabetes:              >6.4% Glycemic control for   <7.0% adults with diabetes   12/02/2014 12:54 PM 6.3 (H) 4.8 - 5.6 % Final    Comment:    (NOTE)         Pre-diabetes: 5.7 - 6.4         Diabetes: >6.4         Glycemic control for adults with diabetes: <7.0     CBG: Recent Labs  Lab 05/19/19 0632  GLUCAP 124*    Past Medical History  She,  has a past medical history of Anemia, Bronchogenic lung cancer, right ([place]) (11/29/2016), CAD  (coronary artery disease), Carotid artery occlusion, CHF (congestive heart failure) ([place]), Complication of anesthesia, COPD (chronic obstructive pulmonary disease) ([place]), GERD (gastroesophageal reflux disease), Headache(784.0), History of kidney stones, Hyperlipidemia, Hypertension, Leg pain, Peripheral vascular disease ([place]), Renal vascular disease (10/26/2014), Severe mitral regurgitation, Shortness of breath dyspnea, and Tobacco abuse.   Surgical History    Past Surgical History:  Procedure Laterality Date  . ABDOMINAL AORTOGRAM W/LOWER EXTREMITY Left 09/16/2018   Procedure: ABDOMINAL AORTOGRAM W/LOWER EXTREMITY;  Surgeon: Marty Heck, MD;  Location: [facility];  Service: Cardiovascular;  Laterality: Left;  . ABDOMINAL HYSTERECTOMY    . ANGIOPLASTY / STENTING ILIAC  2010   right external iliac by Dr. Irish Lack  . CARDIAC CATHETERIZATION  11/13/11   Left Heart Cath. with Coronary Angiogram  . CARDIAC CATHETERIZATION N/A 08/24/2014   Procedure: Left Heart Cath and Coronary Angiography;  Surgeon: Wellington Hampshire, MD;  Location: [facility];  Service: Cardiovascular;  Laterality: N/A;  . CARDIAC CATHETERIZATION N/A 10/26/2014   Procedure:  Right/Left Heart Cath and Coronary Angiography;  Surgeon: Peter M Martinique, MD; oLAD 70%, mLAD 70% ISR, D2 30%, OFC 30%, RCA 20%, EF nl, low R heart pressures after diuresis, severe MR  . CAROTID ENDARTERECTOMY  11/21/2007   left  . COLONOSCOPY W/ POLYPECTOMY    . CORONARY ANGIOPLASTY WITH STENT PLACEMENT  6/09   LAD 2.5x12 Promus  . CORONARY ARTERY BYPASS GRAFT N/A 12/05/2014   Procedure: CORONARY ARTERY BYPASS GRAFTING (CABG);  Surgeon: [name], MD;  Location: [facility];  Service: Open Heart Surgery;  Laterality: N/A;  Times 1 using left internal mammary artery to LAD  . FEMORAL-POPLITEAL BYPASS GRAFT  10/12   left Dr. Kellie Simmering  . LOWER EXTREMITY ANGIOGRAPHY N/A 02/04/2018   Procedure: LOWER EXTREMITY ANGIOGRAPHY;  Surgeon: Marty Heck, MD;  Location: [facility];  Service: Cardiovascular;  Laterality: N/A;  . MITRAL VALVE REPAIR  2016  . MITRAL VALVE REPLACEMENT N/A 12/05/2014   Procedure: MITRAL VALVE (MV) REPLACEMENT;  Surgeon: [name], MD;  Location: [facility];  Service: Open Heart Surgery;  Laterality: N/A;  Closure left atrial appendage  . PERIPHERAL VASCULAR BALLOON ANGIOPLASTY Bilateral 09/16/2018   Procedure: PERIPHERAL VASCULAR BALLOON ANGIOPLASTY;  Surgeon: Marty Heck, MD;  Location: [facility];  Service: Cardiovascular;  Laterality: Bilateral;  bilateral renal arteries, Left external iliac  . PERIPHERAL VASCULAR INTERVENTION Left 02/04/2018   Procedure: PERIPHERAL VASCULAR INTERVENTION;  Surgeon: Marty Heck, MD;  Location: [facility];  Service: Cardiovascular;  Laterality: Left;  external iliac  . RENAL ANGIOGRAPHY Bilateral 09/16/2018   Procedure: RENAL ANGIOGRAPHY;  Surgeon: Marty Heck, MD;  Location: [facility];  Service: Cardiovascular;  Laterality: Bilateral;  . RENAL ARTERY STENT Bilateral   . TEE WITHOUT CARDIOVERSION N/A 10/24/2014   Procedure: TRANSESOPHAGEAL ECHOCARDIOGRAM (TEE);  Surgeon: Thayer Headings, MD;  Location: [facility];  Service: Cardiovascular;  Laterality: N/A;  . TEE WITHOUT CARDIOVERSION N/A 12/05/2014   Procedure: TRANSESOPHAGEAL ECHOCARDIOGRAM (TEE);  Surgeon: [name], MD;  Location: [facility];  Service: Open Heart Surgery;  Laterality: N/A;  . VIDEO ASSISTED THORACOSCOPY (VATS)/WEDGE RESECTION Right 11/29/2016   Procedure: VIDEO ASSISTED THORACOSCOPY (VATS)/ RUL WEDGE RESECTION OF [place]/ NODE SAMPLING;  Surgeon: [name], MD;  Location: [facility];  Service: Thoracic;  Laterality: Right;  Marland Kitchen VIDEO BRONCHOSCOPY N/A 11/29/2016   Procedure: VIDEO BRONCHOSCOPY;  Surgeon: [name], MD;  Location: [facility];  Service: Thoracic;  Laterality: N/A;     Social History   reports that she has been smoking  cigarettes. She has a 17.50 pack-year smoking history. She has never used smokeless tobacco. She reports current alcohol use. She reports that she does not use drugs.   Family History   Her family history includes ALS in her brother; Cancer in her mother; Heart disease in her father; Hypertension in her brother, father, and mother; Kidney disease in her father; Prostate cancer in her father; Stroke in her paternal aunt. There is no history of Heart attack, Colon cancer, Stomach cancer, Rectal cancer, Esophageal cancer, or Liver cancer.   Allergies Allergies  Allergen Reactions  . Lisinopril Swelling    Angioedema 06/10/11  . Chantix [Varenicline] Other (See Comments)    Caused insomnia  . Penicillins Hives    Did it involve swelling of the face/tongue/throat, SOB, or low BP? No Did it involve sudden or severe rash/hives, skin peeling, or any reaction on the inside of your  mouth or nose? No Did you need to seek medical attention at a hospital or doctor's office? No When did it last happen?50 Years If all above answers are "NO", may proceed with cephalosporin use.       Home Medications  Prior to Admission medications   Medication Sig Start Date End Date Taking? Authorizing Provider  cyclobenzaprine (FLEXERIL) 10 MG tablet Take 1 tablet by mouth three times daily as needed for muscle spasm 04/30/19   Guadalupe Dawn, MD  traMADol (ULTRAM) 50 MG tablet TAKE 1 TABLET BY MOUTH EVERY 6 HOURS AS NEEDED FOR PAIN 04/30/19   Guadalupe Dawn, MD  albuterol (PROVENTIL HFA;VENTOLIN HFA) 108 (90 Base) MCG/ACT inhaler Inhale 2 puffs into the lungs every 6 (six) hours as needed for wheezing or shortness of breath. 02/20/16   Archie Patten, MD  aspirin EC 81 MG tablet Take 1 tablet (81 mg total) by mouth daily. 07/24/16   Archie Patten, MD  atorvastatin (LIPITOR) 80 MG tablet Take 1 tablet (80 mg total) by mouth daily. 05/01/18 04/01/19  Martinique, Peter M, MD  clopidogrel (PLAVIX) 75 MG tablet Take 1  tablet by mouth once daily 02/26/19   Marty Heck, MD  ezetimibe (ZETIA) 10 MG tablet Take 1 tablet (10 mg total) by mouth daily. Patient taking differently: Take 10 mg by mouth daily at 3 pm.  03/04/18 04/01/19  Martinique, Peter M, MD  fluticasone (FLOVENT HFA) 44 MCG/ACT inhaler Inhale 1 puff into the lungs daily. 04/12/19   Guadalupe Dawn, MD  furosemide (LASIX) 40 MG tablet Take 0.5 tablets (20 mg total) by mouth daily. Patient taking differently: Take 40 mg by mouth every evening.  06/29/18   Duke, Tami Lin, PA  gabapentin (NEURONTIN) 100 MG capsule TAKE 2 CAPSULES BY MOUTH THREE TIMES DAILY 02/28/19   Guadalupe Dawn, MD  isosorbide-hydrALAZINE (BIDIL) 20-37.5 MG tablet Take 1 tablet by mouth 3 (three) times daily. 03/10/18   Guadalupe Dawn, MD  metoprolol succinate (TOPROL-XL) 25 MG 24 hr tablet Take 1 tablet (25 mg total) by mouth daily. 06/24/18   Martinique, Peter M, MD  nitroGLYCERIN (NITROSTAT) 0.4 MG SL tablet Place 1 tablet (0.4 mg total) under the tongue every 5 (five) minutes x 3 doses as needed for chest pain. 01/29/19   Martinique, Peter M, MD  potassium chloride SA (KLOR-CON) 20 MEQ tablet Take 2 tablets ( 40 meq ) 4 times a day. 05/07/19   Martinique, Peter M, MD  traZODone (DESYREL) 100 MG tablet TAKE 1 TABLET BY MOUTH AT BEDTIME 02/23/19   Guadalupe Dawn, MD     Critical care time: 35 minutes    Mallie Darting, MS4

## 2019-05-21 NOTE — TOC Initial Note (Addendum)
Transition of Care [facility]) - Initial/Assessment Note    Patient Details  Name: Leah [name] MRN: [id number] Date of Birth: [birth year]  Transition of Care [facility]) CM/SW Contact:    Bethena Roys, RN Phone Number: 05/21/2019, 3:03 PM  Clinical Narrative: Patient presented for altered mental status. Prior to arrival patient was from home with Daughter [name] and [name]. Case Manager was able to discuss with family if they can provide 24 hours supervision and they can- [name] states someone is always in the home. Case Manager offered choice for [facility] RN, PT at this time- family is agreeable to have services with [facility]-Referral made to Liaison and start of care to begin within 24-48 hours post transition home. Family will provide transportation to appointments and she uses the neighborhood [place]. Patient's daughter declined a 3n1 stating patient's room is to small and she will only need a Conservation officer, nature for DME- pt will need orders for equipment. Case Manager will continue to follow for oxygen and additional transition of care needs.    Expected Discharge Plan: [place] Barriers to Discharge: Continued Medical Work up   Patient Goals and CMS Choice   CMS Medicare.gov Compare Post Acute Care list provided to:: Patient Represenative (must comment)(Daughter [name].) Choice offered to / list presented to : Adult Children  Expected Discharge Plan and Services Expected Discharge Plan: [place] In-house Referral: NA Discharge Planning Services: CM Consult Post Acute Care Choice: [place] arrangements for the past 2 months: Single Family Home                           HH Arranged: RN, Disease Management, PT [place] Agency: [place] ([facility]) Date [place]: 05/21/19 Time [place]: 1502 Representative spoke with at [place]: [place]  Arrangements/Services Living arrangements for the past 2 months: [place] with:: Adult Children Patient language and need for interpreter reviewed:: Yes Do you feel safe going back to the place where you live?: Yes      Need for Family Participation in Patient Care: Yes (Comment) Care giver support system in place?: Yes (comment)   Criminal Activity/Legal Involvement Pertinent to Current Situation/Hospitalization: No - Comment as needed  Activities of Daily Living      Permission Sought/Granted Permission sought to share information with : Family Supports, Chartered certified accountant granted to share information with : Yes, Verbal Permission Granted     Permission granted to share info w AGENCY: [place] granted to share info w Contact Information: [name] Daughter [phone] (c)  Emotional Assessment Appearance:: Appears stated age Attitude/Demeanor/Rapport: Unable to Assess Affect (typically observed): Unable to Assess Orientation: : Oriented to Self Alcohol / Substance Use: Not Applicable Psych Involvement: No (comment)  Admission diagnosis:  Cardiogenic shock ([place]) [R57.0] NSTEMI (non-ST elevated myocardial infarction) ([place]) [I21.4] Elevated brain natriuretic peptide (BNP) level [R79.89] AKI (acute kidney injury) ([place]) [N17.9] Altered mental status, unspecified altered mental status type [R41.82] AMS (altered mental status) [R41.82] Patient Active Problem List   Diagnosis Date Noted  . Protein-calorie malnutrition, severe 05/19/2019  . AMS (altered mental status) 05/18/2019  . Cardiogenic shock ([place]) 05/18/2019  . AKI (acute kidney injury) ([place])   . Shortness of breath 04/15/2019  . Thoracic aortic aneurysm without rupture ([place]) 09/03/2018  . Viral upper respiratory illness 03/16/2018  . Hypokalemia 02/12/2018  .  Claudication ([place]) 11/21/2017  . Seasonal allergies 07/28/2017  . Bilateral sciatica 07/02/2017  .  Bilateral hearing loss due to cerumen impaction 07/02/2017  . CHF (congestive heart failure) ([place]) 06/05/2017  . Bronchogenic lung cancer, right ([place]) 11/29/2016  . Dysphagia 08/26/2016  . Health care maintenance 04/17/2016  . Dizziness 04/17/2016  . Decreased visual acuity 01/17/2016  . Depressed mood 07/27/2015  . S/P MVR (mitral valve replacement) 12/05/2014  . Moderate aortic regurgitation 10/26/2014  . Renal vascular disease 10/26/2014  . Acute on chronic diastolic congestive heart failure ([place]) 10/21/2014  . Tobacco use 10/21/2014  . Diastolic dysfunction, grade 2 by echo June 2016 10/21/2014  . Carpal tunnel syndrome 10/17/2014  . Severe mitral regurgitation 08/25/2014  . Unstable angina ([place]) 08/22/2014  . Low back pain 07/05/2012  . PVC (premature ventricular contraction) 10/02/2011  . History of angioedema with ACE 2013 06/14/2011  . PVD- s/p multiple proceedures 06/04/2011  . Hyperlipidemia 09/28/2008  . Essential hypertension, benign 09/28/2008  . CAD S/P LAD DES 2009 with 70% ISR 08/24/14 09/28/2008   PCP:  Guadalupe Dawn, MD Pharmacy:   [facility] [place] [address] Phone: [phone] Fax: [phone]  [place], [place] - [address] [place] [address] Phone: [phone] Fax: [phone]  [place], [place] - [address] [place] [address] Phone: [phone] Fax: [phone]     Social Determinants of Health (SDOH) Interventions    Readmission Risk Interventions Readmission Risk Prevention Plan 05/21/2019  Transportation Screening Complete  PCP or Specialist Appt within 3-5 Days Complete  HRI or [place] Complete  Social Work Consult for [place] Planning/Counseling Complete  Palliative Care Screening Not Applicable  Medication Review Human resources officer) Complete  Some recent data might be hidden

## 2019-05-22 LAB — BASIC METABOLIC PANEL
Anion gap: 12 (ref 5–15)
BUN: 37 mg/dL — ABNORMAL HIGH (ref 8–23)
CO2: 24 mmol/L (ref 22–32)
Calcium: 8.4 mg/dL — ABNORMAL LOW (ref 8.9–10.3)
Chloride: 93 mmol/L — ABNORMAL LOW (ref 98–111)
Creatinine, Ser: 1.6 mg/dL — ABNORMAL HIGH (ref 0.44–1.00)
GFR calc Af Amer: 39 mL/min — ABNORMAL LOW (ref >60)
GFR calc non Af Amer: 33 mL/min — ABNORMAL LOW (ref >60)
Glucose, Bld: 135 mg/dL — ABNORMAL HIGH (ref 70–99)
Potassium: 3.6 mmol/L (ref 3.5–5.1)
Sodium: 129 mmol/L — ABNORMAL LOW (ref 135–145)

## 2019-05-22 LAB — CBC
HCT: 33.8 % — ABNORMAL LOW (ref 36.0–46.0)
Hemoglobin: 11.3 g/dL — ABNORMAL LOW (ref 12.0–15.0)
MCH: 25.4 pg — ABNORMAL LOW (ref 26.0–34.0)
MCHC: 33.4 g/dL (ref 30.0–36.0)
MCV: 76 fL — ABNORMAL LOW (ref 80.0–100.0)
Platelets: 52 10*3/uL — ABNORMAL LOW (ref 150–400)
RBC: 4.45 MIL/uL (ref 3.87–5.11)
RDW: 20.9 % — ABNORMAL HIGH (ref 11.5–15.5)
WBC: 12.2 10*3/uL — ABNORMAL HIGH (ref 4.0–10.5)
nRBC: 1.6 % — ABNORMAL HIGH (ref 0.0–0.2)

## 2019-05-22 LAB — COOXEMETRY PANEL
Carboxyhemoglobin: 1.1 % (ref 0.5–1.5)
Methemoglobin: 0.6 % (ref 0.0–1.5)
O2 Saturation: 71.8 %
Total hemoglobin: 11.5 g/dL — ABNORMAL LOW (ref 12.0–16.0)

## 2019-05-22 LAB — MAGNESIUM: Magnesium: 2.3 mg/dL (ref 1.7–2.4)

## 2019-05-22 MED ORDER — POTASSIUM CHLORIDE 20 MEQ/15ML (10%) PO SOLN
20.0000 meq | Freq: Once | ORAL | Status: AC
  Administered 2019-05-22: 17:00:00 20 meq via ORAL
  Filled 2019-05-22: qty 15

## 2019-05-22 MED ORDER — ISOSORB DINITRATE-HYDRALAZINE 20-37.5 MG PO TABS
2.0000 | ORAL_TABLET | Freq: Three times a day (TID) | ORAL | Status: DC
  Administered 2019-05-22 – 2019-05-29 (×22): 2 via ORAL
  Filled 2019-05-22 (×22): qty 2

## 2019-05-22 MED ORDER — MILRINONE LACTATE IN DEXTROSE 20-5 MG/100ML-% IV SOLN
0.2500 ug/kg/min | INTRAVENOUS | Status: DC
  Administered 2019-05-22 (×2): 0.25 ug/kg/min via INTRAVENOUS
  Administered 2019-05-25: 18:00:00 0.125 ug/kg/min via INTRAVENOUS
  Administered 2019-05-27 – 2019-06-02 (×8): 0.25 ug/kg/min via INTRAVENOUS
  Filled 2019-05-22 (×13): qty 100

## 2019-05-22 MED ORDER — SPIRONOLACTONE 12.5 MG HALF TABLET
12.5000 mg | ORAL_TABLET | Freq: Every day | ORAL | Status: DC
  Administered 2019-05-22 – 2019-05-29 (×8): 12.5 mg via ORAL
  Filled 2019-05-22 (×8): qty 1

## 2019-05-22 NOTE — Progress Notes (Signed)
FPTS Interim Progress Note  FPTS Continues to follow Leah [name] socially.  We will gladly receive her when she is able to return to floor status. Until then we appreciate the Heart Failure team and the care they are providing to our mutual patient.  Gladys Damme, MD 05/22/2019, 4:00 AM PGY-1, [place] Medicine Service pager [phone]

## 2019-05-22 NOTE — Progress Notes (Signed)
Physical Therapy Treatment Patient Details Name: Leah [name] MRN: [id number] DOB: Aug 28, 1950 Today's Date: 05/22/2019    History of Present Illness 69 y.o. 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 Pt brought to the ED for AMS, now with concern for cardiogenic shock.    PT Comments    Pt limited by fatigue during session, sleepy upon PT arrival and needing frequent rest breaks between limited therapeutic exercise. Pt only able to tolerate 5 repetitions of low level LE exercise before needing to take a break at this time. Pt is generally weak and at a high falls risk due to weakness and poor activity tolerance. Pt will benefit from continued acute PT services to reduce falls risk and caregiver burden, if PT POC aligns with pt's goals of care. PT updating recommendations to SNF due to poor activity tolerance and high falls risk.   Follow Up Recommendations  SNF;Supervision/Assistance - 24 hour(pending goals of care)     Equipment Recommendations  Rolling walker with 5" wheels    Recommendations for Other Services       Precautions / Restrictions Precautions Precautions: Fall Precaution Comments: tachy Restrictions Weight Bearing Restrictions: No    Mobility  Bed Mobility                  Transfers Overall transfer level: Needs assistance Equipment used: 1 person hand held assist Transfers: Sit to/from Stand Sit to Stand: Min assist         General transfer comment: pt performs partial stand to scoot back in recliner. sliding forward in chair upon PT arrival  Ambulation/Gait                 Stairs             Wheelchair Mobility    Modified Rankin (Stroke Patients Only)       Balance Overall balance assessment: Needs assistance Sitting-balance support: Single  extremity supported;Feet supported Sitting balance-[name]: Fair Sitting balance - Comments: close supervision with UE support anf at least one foot supported                                    Cognition Arousal/Alertness: Awake/alert Behavior During Therapy: WFL for tasks assessed/performed Overall Cognitive Status: Impaired/Different from baseline Area of Impairment: Memory;Awareness;Safety/judgement;Problem solving                     Memory: Decreased recall of precautions;Decreased short-term memory   Safety/Judgement: Decreased awareness of deficits   Problem Solving: Slow processing;Requires verbal cues        Exercises General Exercises - Lower Extremity Ankle Circles/Pumps: AROM;Both;10 reps Quad Sets: AROM;Both;5 reps Gluteal Sets: AROM;Both;5 reps Long Arc Quad: AROM;Both;5 reps Heel Slides: AROM;Both;5 reps Hip Flexion/Marching: AROM;Both;5 reps    General Comments General comments (skin integrity, edema, etc.): Pt tachy in 110s-120s with seated exercise      Pertinent Vitals/Pain Pain Assessment: No/denies pain    Home Living                      Prior Function            PT Goals (current goals can now be found in the care plan  section) Acute Rehab PT Goals Patient Stated Goal: To return to independent PLOF Progress towards PT goals: Not progressing toward goals - comment(limited by fatigue)    Frequency    Min 3X/week      PT Plan Discharge plan needs to be updated    Co-evaluation              AM-PAC PT "6 Clicks" Mobility   Outcome Measure  Help needed turning from your back to your side while in a flat bed without using bedrails?: A Little Help needed moving from lying on your back to sitting on the side of a flat bed without using bedrails?: A Lot Help needed moving to and from a bed to a chair (including a wheelchair)?: A Little Help needed standing up from a chair using your arms (e.g.,  wheelchair or bedside chair)?: A Lot Help needed to walk in hospital room?: A Lot Help needed climbing 3-5 steps with a railing? : Total 6 Click Score: 13    End of Session Equipment Utilized During Treatment: Oxygen Activity Tolerance: Patient limited by fatigue Patient left: in chair;with call bell/phone within reach Nurse Communication: Mobility status PT Visit Diagnosis: Unsteadiness on feet (R26.81)     Time: 1601-0932 PT Time Calculation (min) (ACUTE ONLY): 14 min  Charges:  $Therapeutic Exercise: 8-22 mins                     Zenaida Niece, PT, DPT Acute Rehabilitation Pager: [phone]    Zenaida Niece 05/22/2019, 9:49 AM

## 2019-05-22 NOTE — Progress Notes (Signed)
  Speech Language Pathology Treatment: Dysphagia  Patient Details Name: Leah [name] MRN: [id number] DOB: 1950-04-07 Today's Date: 05/22/2019 Time: 3614-4315 SLP Time Calculation (min) (ACUTE ONLY): 11 min  Assessment / Plan / Recommendation Clinical Impression  New orders for BSE and FEES were received and acknowledged.  Spoke with RN who stated that pt had not had any significant changes or difficulty tolerating her current diet of Dysphagia 3 (soft) solids and thin liquids.  She additionally stated that a [facility] meeting was planned for later this afternoon.  Since a BSE was completed during this admission on 05/19/19 and the pt is already on the ST caseload, a treatment session targeting diagnostic treatment was performed in place of a repeat BSE.  Pt was encountered asleep in a reclining chair and she roused to minimal verbal stimulation.  She was cooperative, but she remained lethargic throughout this tx session.  She consumed trials of thin liquid via straw sip, soft solids, and regular solids.  Mastication and AP transport were moderately prolonged with regular solid trials and moderate residue was observed on the pt's hard palate and in her R buccal cavity despite a cued liquid wash.  Pt was not sensate to the residue, but she was able to follow verbal and tactile cues to complete a lingual sweep in order to clear residue.  Bolus formation and AP transport were mildly prolonged with soft solid trials, but only trace oral residue was observed following swallow initiation.  No clinical s/sx of aspiration were observed with any liquid or solid trials.  Do not believe that a FEES is warranted at this time to further evaluate swallow function given the pt's current tolerance of her diet.  Recommend continuation of Dysphagia 3 (soft) solids and thin liquids with medications administered crushed in puree and full supervision to cue for compensatory strategies (listed below).  SLP will continue to f/u per  POC.     HPI HPI: Pt is a 69 y.o. female who presented with altered mental status. PMH is significant for CAD, HTN, HLD, PVD, HFpEF, CKD, MV replacement, Hx bronchogenic lung cancer, and tobacco use. Chest x-ray 2/24: Mild congestive heart failure, slightly worsened.       SLP Plan  Continue with current plan of care       Recommendations  Diet recommendations: Dysphagia 3 (mechanical soft);Thin liquid Liquids provided via: Cup;Straw Medication Administration: Crushed with puree Supervision: Intermittent supervision to cue for compensatory strategies Compensations: Slow rate;Small sips/bites Postural Changes and/or Swallow Maneuvers: Seated upright 90 degrees                Oral Care Recommendations: Oral care BID Follow up Recommendations: None SLP Visit Diagnosis: Dysphagia, unspecified (R13.10) Plan: Continue with current plan of care                     Colin Mulders M.S., [place] Acute Rehabilitation Services Office: [phone]  [place] 05/22/2019, 10:11 AM

## 2019-05-22 NOTE — Discharge Summary (Addendum)
[facility] Discharge Summary  Patient name: Leah [name] [facility] Medical record number: [id number] Date of birth: [birth year] Age: 69 y.o. Gender: female Date of Admission: 05/18/2019  Date of Discharge: 06/03/2019 Admitting Physician: Kinnie Feil, MD  Primary Care Provider: Guadalupe Dawn, MD Consultants: cardiology, CCM  Indication for Hospitalization: acute cardiomyopathy leading to severe biventricular dysfunction and cardiogenic shock  Discharge Diagnoses/Problem List:  Cardiogenic shock Cardiomyopathy CAD Moderate to severe AI Lung cancer s/p resection Severe PAD AKI AMS (resolved) Hypokalemia/hyponatremia PVCs Microcytic anemia  Disposition: SNF  Discharge Condition: medically stable for discharge  Discharge Exam:   General: Alert, pleasant, cooperative, no acute distress Cardio: Normal S1 and S2, systolic murmur best heard in LSB, No murmurs or rubs.   Pulm: CTAB,, no crackles, wheezing, or diminished breath sounds. Normal respiratory effort Abdomen: Bowel sounds normal. Abdomen soft and non-tender.  Extremities: No peripheral edema. Warm/ well perfused.  Strong radial pulse. Neuro: Cranial nerves grossly intact  Brief Hospital Course:  Leah [name] is a 69 y.o. female who presented with altered mental status. PMH is significant for CAD, HTN, HLD, PVD, HFpEF, CKD, MV replacement, Hx bronchogenic lung cancer, and tobacco use.  Cardiogenic shock with severe biventricular dysfunction  The patient presented to the ED 05/18/19 for AMS. Her son contacted the ED after finding her still sitting in her chair from the day prior. At time of admission was on 4L of O2 [place] with cold extremities. In the ED B/P 133/95, pulse 116, temp 97.6, RR 20, 100% O2. Labs showed potassium 3.7, creatinine 2.33 (1.1 in 2/20, 1.8 on 04/28/19), glucose 114, anion gap 19. WBC 9.4, Hgb 13.6. platelets 90. HS troponin 407. CXR with cardiac enlargement, no HF, no  acute infiltrate.Given IVF bolus. EKG showed sinus tach with LVH and ST depression in V6.Lactate 6.7. Echo: LVEF 10% with severe RV dysfunction. Moderate to severe AI. CT head non-con was negative for any acute abnormality. U/S bilateral of lower extremities were negative for DVT. Chest CT showed stable aortic anuersym. BNP also signifcantly elevated above baseline at >4,500. Ethanol level was <10. Ammonia level normal. UDS negative. TSH normal. COVID-19 negative. Cardiology consulted and believed the patient to be in cardiogenic shock due to suspected acute viral myocarditis. She was admitted to the ICU. A Swan-Ganz catheter was placed. She was started on a milrinone and bicarbonate drip. 2/25 Bicarb gtt was discontinued with a lactic acid of 1.2 and was started on Bidil three times daily. 2/27 Spironolactone started. 3/6 Spironolactone, digoxin and losartan were held. She was seen daily by the heart failure team who were a large part of patients care. They recommended keeping pt on milrinone on discharge to SNF. Pt had a PICC line inserted on 3/10 for use of Milrinone. Multiple collaterals in right neck were found and suspect proximal occlusion of right IJ.  Small amount of thrombus in right IJ.   This was discussed with HF Attending who recommended that pt did not require further investigation for this.   Patient's discharge medications per cardiology were:  Milrinone 0.25 mcg/kg/min, Amiodarone 200 mg once daily , ASA 81 mg qd, Plavix 75 mg qd, Lasix 40 mg qd, Bidil 20-37.5 tid.  Goals of care 2/26 Palliative consulted. 2/27 Family meeting and patient remained full code.  3/2 DNR placed but patient will still receive full aggressive care. [facility] palliative team will follow up with patient at SNF.  AKI on CKD Patient's baseline Cr is 1-1.3. Cr on admission was  2.33 most likely from cardiopulmonary syndrome and intravascular depletion. Creatinine did worsen during hospital course but then improved to 1.71  on discharge  AMS, Leukocytosis Pt developed AMS and was febrile with WBC 19.9. UTI was presumed and pt was started on broad spectrum antibiotics. Started on Vancomycin (3/5-3/8) and Cefepime (3/5-3/9). Her WBC improved to 12.3 on discharge and patient remained afebrile. Blood and urine cultures showed no growth at 5 days.  Frequent PVCs/ Hypokalemia/ Hypomagnesemia Electrolytes were monitored during hospital course and repleted accordingly.  Patient continued on amiodarone 200 mg twice daily. K 4.1 on discharge, Mg is 1.9 on discharge.     Iron deficiency anemia Admission hemoglobin was 14.6. Throughout hospital stay hemoglobin varied between 9 and 11.  Patient continued to have Feraheme 510 mg weekly. Hb on discharge is 9.4. Patient will have outpatient colonoscopy .  She was discharged to a SNF which permits use of milrinone.   Issues for Follow Up:  1. Echo  2. Patient was seen by palliative care in the hospital.  They will continue to follow patient as outpatient.  Recommend that she continue to follow up with Palliative Care at [facility]. 3. Cardiology will follow up patient on 29rd March  4. Outpatient colonoscopy 5. Follow up K on discharge and managed accordingly  Significant Procedures:  PICC line inserted on 3/10  Significant Labs and Imaging:  Recent Labs  Lab 06/01/19 0315 06/02/19 0448 06/03/19 0834  WBC 15.3* 14.1* 12.3*  HGB 9.9* 9.5* 9.4*  HCT 27.9* 27.4* 27.7*  PLT 179 171 174   Recent Labs  Lab 05/30/19 0718 05/30/19 0718 05/31/19 0610 05/31/19 0610 06/01/19 0315 06/01/19 0315 06/02/19 0448 06/03/19 0500 06/03/19 0834  NA 126*  --  124*  --  127*  --  130*  --  130*  K 3.2*   < > 4.0   < > 3.4*   < > 3.6  --  4.1  CL 91*  --  93*  --  97*  --  97*  --  100  CO2 23  --  20*  --  21*  --  21*  --  21*  GLUCOSE 89  --  179*  --  101*  --  90  --  121*  BUN 59*  --  54*  --  50*  --  44*  --  42*  CREATININE 2.78*  --  2.56*  --  2.21*  --  1.86*  --  1.71*   CALCIUM 8.1*  --  8.1*  --  8.2*  --  8.5*  --  8.6*  MG 2.2   < > 2.2  --  2.0  --  2.0 1.9 1.9  ALKPHOS  --   --   --   --  92  --   --   --   --   AST  --   --   --   --  57*  --   --   --   --   ALT  --   --   --   --  62*  --   --   --   --   ALBUMIN  --   --   --   --  1.9*  --   --   --   --    < > = values in this interval not displayed.     Results/Tests Pending at Time of Discharge:  None   Discharge Medications:  Allergies as of 06/03/2019      Reactions   Lisinopril Swelling   Angioedema 06/10/11   Chantix [varenicline] Other (See Comments)   Caused insomnia   Penicillins Hives   Did it involve swelling of the face/tongue/throat, SOB, or low BP? No Did it involve sudden or severe rash/hives, skin peeling, or any reaction on the inside of your mouth or nose? No Did you need to seek medical attention at a hospital or doctor's office? No When did it last happen?50 Years If all above answers are "NO", may proceed with cephalosporin use.      Medication List    STOP taking these medications   atorvastatin 80 MG tablet Commonly known as: LIPITOR   ezetimibe 10 MG tablet Commonly known as: ZETIA   metoprolol succinate 25 MG 24 hr tablet Commonly known as: TOPROL-XL   nitroGLYCERIN 0.4 MG SL tablet Commonly known as: NITROSTAT   traMADol 50 MG tablet Commonly known as: ULTRAM   traZODone 100 MG tablet Commonly known as: DESYREL     TAKE these medications   albuterol 108 (90 Base) MCG/ACT inhaler Commonly known as: VENTOLIN HFA Inhale 2 puffs into the lungs every 6 (six) hours as needed for wheezing or shortness of breath.   amiodarone 200 MG tablet Commonly known as: PACERONE Take 1 tablet (200 mg total) by mouth 2 (two) times daily.   aspirin EC 81 MG tablet Take 1 tablet (81 mg total) by mouth daily.   BiDil 20-37.5 MG tablet Generic drug: isosorbide-hydrALAZINE Take 1 tablet by mouth 3 (three) times daily.   clopidogrel 75 MG  tablet Commonly known as: PLAVIX Take 1 tablet (75 mg total) by mouth daily.   cyclobenzaprine 10 MG tablet Commonly known as: FLEXERIL Take 1 tablet (10 mg total) by mouth 3 (three) times daily as needed for muscle spasms.   feeding supplement (ENSURE ENLIVE) Liqd Take 237 mLs by mouth 3 (three) times daily between meals.   Flovent HFA 44 MCG/ACT inhaler Generic drug: fluticasone Inhale 1 puff into the lungs daily.   furosemide 40 MG tablet Commonly known as: LASIX Take 1 tablet (40 mg total) by mouth daily. What changed: how much to take   gabapentin 100 MG capsule Commonly known as: NEURONTIN Take 2 capsules (200 mg total) by mouth at bedtime. What changed: when to take this   Gerhardt's butt cream Crea Apply 1 application topically daily. Apply to affected areas once daly   milrinone 20 MG/100 ML Soln infusion Commonly known as: PRIMACOR Inject 0.0159 mg/min into the vein continuous.   multivitamin with minerals Tabs tablet Take 1 tablet by mouth daily.   polyethylene glycol 17 g packet Commonly known as: MIRALAX / GLYCOLAX Take 17 g by mouth daily as needed for mild constipation.   potassium chloride SA 20 MEQ tablet Commonly known as: KLOR-CON Take 1 tablet (20 mEq total) by mouth 2 (two) times daily. What changed:   how much to take  when to take this   senna 8.6 MG Tabs tablet Commonly known as: SENOKOT Take 1 tablet (8.6 mg total) by mouth daily.            Durable Medical Equipment  (From admission, onward)         Start     Ordered   05/25/19 0721  For home use only DME Walker rolling  Once    Question Answer Comment  Walker: With 5 Inch Wheels   Patient needs a walker to treat  with the following condition Weakness      05/25/19 0720   05/21/19 0917  Heart failure home health orders  (Heart failure home health orders / Face to face)  Once    Comments: Heart Failure Follow-up Care:  Verify follow-up appointments per Patient Discharge  Instructions. Confirm transportation arranged. Reconcile home medications with discharge medication list. Remove discontinued medications from use. Assist patient/caregiver to manage medications using pill box. Reinforce low sodium food selection Assessments: Vital signs and oxygen saturation at each visit. Assess home environment for safety concerns, caregiver support and availability of low-sodium foods. Consult Education officer, museum, PT/OT, Dietitian, and CNA based on assessments. Perform comprehensive cardiopulmonary assessment. Notify MD for any change in condition or weight gain of 3 pounds in one day or 5 pounds in one week with symptoms. Daily Weights and Symptom Monitoring: Ensure patient has access to scales. Teach patient/caregiver to weigh daily before breakfast and after voiding using same scale and record.    Teach patient/caregiver to track weight and symptoms and when to notify Provider. Activity: Develop individualized activity plan with patient/caregiver.    'HHRN/HHPT/ HHOT  Question Answer Comment  Heart Failure Follow-up Care [facility] ([facility] at [phone]   Obtain the following labs Other see comments   Lab frequency Other see comments   Fax lab results to [facility] Clinic at [phone]   Diet Low Sodium Heart Healthy   Fluid restrictions: 2000 mL Fluid      05/21/19 0917          Discharge Instructions: Please refer to Patient Instructions section of EMR for full details.  Patient was counseled important signs and symptoms that should prompt return to medical care, changes in medications, dietary instructions, activity restrictions, and follow up appointments.   Follow-Up Appointments:  Contact information for follow-up providers    [facility] Follow up.   Specialty: Home Health Services Why: Registered Nurse and Physical Therapy       [facility] Follow up on 06/21/2019.    Specialty: Cardiology Why: 8:30 AM Advanced [facility] Clinic. Parking Garage Code [employer] information: [address] [id number] [place] [phone]           Contact information for after-discharge care    Destination    [facility] Preferred SNF .   Service: Skilled Nursing Contact information: [place]                  [name], MD 06/03/2019, 12:31 PM PGY-1, [place]

## 2019-05-22 NOTE — Progress Notes (Signed)
Advanced Heart Failure Rounding Note  PCP-Cardiologist: Peter Martinique, MD   Subjective:    Sitting up in chair. Feels weak. Off NE milrinone down to 0.375. Co-ox 71%  Creatinine improving slowly 1.7->1.6. Weight up 4 pounds  PVCs improved with IV amio   Objective:   Weight Range: 63.4 kg Body mass index is 18.44 kg/m.   Vital Signs:   Temp:  [97.4 F (36.3 C)-98.2 F (36.8 C)] 98.1 F (36.7 C) (02/27 0300) Resp:  [8-26] 18 (02/27 0700) BP: (102-143)/(46-95) 138/72 (02/27 0700) SpO2:  [98 %-100 %] 98 % (02/27 0700) Weight:  [63.4 kg] 63.4 kg (02/27 0500) Last BM Date: (UTA)  Weight change: Filed Weights   05/20/19 0500 05/21/19 0800 05/22/19 0500  Weight: 64.8 kg 61.5 kg 63.4 kg    Intake/Output:   Intake/Output Summary (Last 24 hours) at 05/22/2019 0825 Last data filed at 05/22/2019 0700 Gross per 24 hour  Intake 1011.54 ml  Output 520 ml  Net 491.54 ml      Physical Exam   General: Elderly frail Sitting in the chair.  No resp difficulty HEENT: normal Neck: supple. no JVD. Carotids 2+ bilat; no bruits. No lymphadenopathy or thryomegaly appreciated. Cor: PMI nondisplaced. Regular rate & rhythm. No rubs, gallops or murmurs. Lungs: clear Abdomen: soft, nontender, nondistended. No hepatosplenomegaly. No bruits or masses. Good bowel sounds. Extremities: no cyanosis, clubbing, rash, edema cachetic  Neuro: alert & orientedx3, cranial nerves grossly intact. moves all 4 extremities w/o difficulty. Affect pleasant   Telemetry   Sinus Tach 100s frequent PVCs.    EKG    No new EKG to review   Labs    CBC Recent Labs    05/21/19 0624 05/21/19 0624 05/21/19 1206 05/22/19 0444  WBC 11.6*  --   --  12.2*  HGB 12.0  --   --  11.3*  HCT 36.2  --   --  33.8*  MCV 76.9*  --   --  76.0*  PLT 57*   < > 57* 52*   < > = values in this interval not displayed.   Basic Metabolic Panel Recent Labs    05/20/19 1407 05/21/19 0624 05/21/19 1206  05/22/19 0444  NA  --  136  --  129*  K  --  4.2  --  3.6  CL  --  97*  --  93*  CO2  --  27  --  24  GLUCOSE  --  103*  --  135*  BUN  --  36*  --  37*  CREATININE  --  1.71*  --  1.60*  CALCIUM  --  8.8*  --  8.4*  MG   < >  --  1.8 2.3   < > = values in this interval not displayed.   Liver Function Tests No results for input(s): AST, ALT, ALKPHOS, BILITOT, PROT, ALBUMIN in the last 72 hours. No results for input(s): LIPASE, AMYLASE in the last 72 hours. Cardiac Enzymes No results for input(s): CKTOTAL, CKMB, CKMBINDEX, TROPONINI in the last 72 hours.  BNP: BNP (last 3 results) Recent Labs    05/18/19 1707 05/21/19 1206  BNP >4,500.0* 1,490.6*    ProBNP (last 3 results) No results for input(s): PROBNP in the last 8760 hours.   D-Dimer Recent Labs    05/21/19 1206  DDIMER 13.76*   Hemoglobin A1C No results for input(s): HGBA1C in the last 72 hours. Fasting Lipid Panel No results for input(s): CHOL, HDL,  LDLCALC, TRIG, CHOLHDL, LDLDIRECT in the last 72 hours. Thyroid Function Tests No results for input(s): TSH, T4TOTAL, T3FREE, THYROIDAB in the last 72 hours.  Invalid input(s): FREET3  Other results:   Imaging    No results found.   Medications:     Scheduled Medications: . aspirin  81 mg Oral Daily  . atorvastatin  80 mg Oral Daily  . Chlorhexidine Gluconate Cloth  6 each Topical Daily  . clopidogrel  75 mg Oral Daily  . enoxaparin (LOVENOX) injection  30 mg Subcutaneous Q24H  . ezetimibe  10 mg Oral Daily  . feeding supplement (ENSURE ENLIVE)  237 mL Oral TID BM  . fluticasone  1 puff Inhalation Daily  . gabapentin  200 mg Oral TID  . isosorbide-hydrALAZINE  0.5 tablet Oral Once  . isosorbide-hydrALAZINE  1 tablet Oral TID  . multivitamin with minerals  1 tablet Oral Daily  . sodium chloride flush  10-40 mL Intracatheter Q12H  . sodium chloride flush  10-40 mL Intracatheter Q12H  . sodium chloride flush  3 mL Intravenous Once  . sodium  chloride flush  3 mL Intravenous Q12H    Infusions: . sodium chloride Stopped (05/19/19 1732)  . amiodarone 30 mg/hr (05/22/19 0700)  . magnesium sulfate bolus IVPB Stopped (05/19/19 1903)  . milrinone 0.375 mcg/kg/min (05/22/19 0700)  . norepinephrine (LEVOPHED) Adult infusion Stopped (05/18/19 2352)    PRN Medications: sodium chloride, acetaminophen, albuterol, ALPRAZolam, cyclobenzaprine, ondansetron (ZOFRAN) IV, polyethylene glycol, sodium chloride flush, sodium chloride flush, sodium chloride flush, zolpidem    Assessment/Plan   1. Cardiogenic shock with sever biventricular dysfunction - Previous EF 40% in 10/18 - Echo in ER EF 10% with severe RV dysfunction. Moderate to severe AI - Suspect acute viral myocarditis (recent viral infection) - initial lactate > 6.7 - initial swan #s w/ low output and elevated filling pressures: CO/CI 1.49/0.84, RA 16, PCWP 26, PAPi 0.74. Co-ox 39% - Suspect PVCs playing a role. Add amio  - CVP 5. Volume status stable.  - on Milrinone 0.375 mcg. Co-ox 71% . Cut back milrinone to 0.22mcg.  - Increase Bidil to 2 tablet tid  - add spiro.  - consider low-dose digoxin and losartan if creatinine continues to improve - not candidate for mechanical support with PAD and severe AI - Palliative Care discussions ongoing. I think she wil weather the acute storm better than I thought she might but now will have HF on top of her other chronic issues and not sure how good we can get her QOL. I feel we should continue to be aggressive for now but I feel setting limits with DNR/DNI is probably appropriate as heroic measure would not likely lead to a good outcome in someone this ill - Consider cMRI and cath early next week if creatinine gets to 1.5 or better  2. CAD - s/p CABG with LIMA to LAD and MVR (bioprosthetic) - Trop 407 -> 387->492.  - doubt ACS but cannot exclude ischemia as contributing factor to CM - No chest pain.  - continue asa, Plavix + statin    3. Moderate to severe AI - consider TEE - BCx NGTD  4. Lung cancer s/p resection - stable  5. Severe PAD - s/p left fem-pop and L CEA - bilateral RAS s/p stentig  6. AKI - due to shock - SCr trend 1.9>>2.17>>1.86>>1.97> 1.7 -> 1.6 - Continue milrinone.   7. AMS - due to shock. Now resolved  8. Hypokalemia/hyponatremia:  - k 3.6  today. - will supp to keep > 4.0 given frequent ventricular ectopy on milrinone - add spiro   9. PVCs - Frequent PVCs.  - continue amio. Keep K> 4. Mg > 2.0 - no b-blocker with shock   PT/OT appreciated with recommendations for HHPT/OT.   Likely can go to [facility] soon. Perhaps later today \  Length of Stay: [place], MD  05/22/2019, 8:25 AM  Advanced Heart Failure Team Pager [phone] (M-F; 7a - 4p)  Please contact [facility] for night-coverage after hours (4p -7a ) and weekends on [email or web address]  Agree with above.    She remains tenuous on milrinone 0.5. Co-ox 57%. Having very frequent PVCs. CVP 5. Very weak. Up to chair. Creatinine improved slightly to 1.7  Elderly weak/frail HEENT: normal Neck: supple. no JVD. Carotids 2+ bilat; no bruits. No lymphadenopathy or thryomegaly appreciated. Cor: PMI nondisplaced. Irregular rate & rhythm. No rubs, gallops or murmurs. Lungs: clear Abdomen: soft, nontender, nondistended. No hepatosplenomegaly. No bruits or masses. Good bowel sounds. Extremities: no cyanosis, clubbing, rash, edema cachetic  Neuro: alert & orientedx3, cranial nerves grossly intact. moves all 4 extremities w/o difficulty. Affect pleasant  Shock improved with milrinone but still very tenuous. By history and troponins, I suspect likely viral myocarditis. With high burden of PVCs, I worry about possible giant cell myocarditis but has not had VT or atrial arrhythmias. Will wean milrinone to 0.375. Increase Bidil. Supp K and mag. PT/OT input appreciated.   Ideally will need coronary angio and cMRI +/- TEE to reassess AI  but will need to see what her clinical course does first. May be able to go to [facility] soon.   Glori Bickers, MD  8:25 AM

## 2019-05-22 NOTE — Progress Notes (Signed)
Palliative:  HPI: 69 y.o. female  with past medical history of systolic CHF (EF 97% 3532), CAD s/p stenting 2016, mitral valve replacement 2016, HTN, HLD, COPD, tobacco use, lung adenocarcinoma stage 1 s/p resection 2018, PVD/PAD admitted on 05/18/2019 with cardiogenic shock and EF decreased to 10%, severe RV dysfunction, severe aortic insufficiency as well with potential acute viral myocarditis or even giant cell myocarditis per heart failure team. Required ICU admission with milrinone and bicarb infusion.    I met today with Ms. Howald's children Gertie Baron, [name], and [place]. We discussed her decline in heart function down to EF 10%. Discussed that only time will tell how much she will improve and she has shown some improvement since hospitalized but continues to be extremely frail and tenuous and critically ill. Unfortunately she could decline further at any time although we are trying to support her medically to prevent this from happening the best that we can. They want to be hopeful and work towards being able to get her medically optimized and eventually back home. She lives with all the children except for Levada Dy so she has excellent support.   We did discuss that while being helpful it is also good to plan in case she does worsen. We discussed resuscitation and while 2 of her children report that she has made comments to her that she would wish to be resuscitated but would not want to be kept alive on machines. I did explain that she would not be expected to survive a resuscitation attempt and I worry this would cause her more pain and suffering at the end of her life if it comes to this. I encouraged family to continue discussions and consider what would be best for her.   Her children are appropriately tearful and ask good questions. They do understand the gravity of her situation. I took them back to visit with Ms. Hallam who perked up for her children and was speaking with them  very well today. I encouraged them to spend some time with her today. She has been unable to retain the details of her situation and unable to have goals of care conversation with her due to her lethargy.   All questions/concerns addressed. Emotional support provided. Ericka Pontiff, RN and Dr. Haroldine Laws.   Exam: Lethargic but arouses to voice. Difficulty maintaining alertness. Sitting up in recliner. Thin, frail. Breathing regular, unlabored. Abd flat. Generalized weakness.   Plan: - Family encouraged to continue to consider code status. She remains full code at this time and no changes to status or goals of care at this time.   22 min  Vinie Sill, NP Palliative Medicine Team Pager [phone] (Please see [email or web address] for schedule) Team Phone [phone]    Greater than 50%  of this time was spent counseling and coordinating care related to the above assessment and plan

## 2019-05-23 LAB — BASIC METABOLIC PANEL
Anion gap: 12 (ref 5–15)
BUN: 39 mg/dL — ABNORMAL HIGH (ref 8–23)
CO2: 24 mmol/L (ref 22–32)
Calcium: 8.2 mg/dL — ABNORMAL LOW (ref 8.9–10.3)
Chloride: 92 mmol/L — ABNORMAL LOW (ref 98–111)
Creatinine, Ser: 1.64 mg/dL — ABNORMAL HIGH (ref 0.44–1.00)
GFR calc Af Amer: 37 mL/min — ABNORMAL LOW (ref >60)
GFR calc non Af Amer: 32 mL/min — ABNORMAL LOW (ref >60)
Glucose, Bld: 105 mg/dL — ABNORMAL HIGH (ref 70–99)
Potassium: 4.3 mmol/L (ref 3.5–5.1)
Sodium: 128 mmol/L — ABNORMAL LOW (ref 135–145)

## 2019-05-23 LAB — CULTURE, BLOOD (ROUTINE X 2)
Culture: NO GROWTH
Culture: NO GROWTH

## 2019-05-23 LAB — CBC
HCT: 32.7 % — ABNORMAL LOW (ref 36.0–46.0)
Hemoglobin: 11.1 g/dL — ABNORMAL LOW (ref 12.0–15.0)
MCH: 25.1 pg — ABNORMAL LOW (ref 26.0–34.0)
MCHC: 33.9 g/dL (ref 30.0–36.0)
MCV: 74 fL — ABNORMAL LOW (ref 80.0–100.0)
Platelets: 54 10*3/uL — ABNORMAL LOW (ref 150–400)
RBC: 4.42 MIL/uL (ref 3.87–5.11)
RDW: 20.5 % — ABNORMAL HIGH (ref 11.5–15.5)
WBC: 10.9 10*3/uL — ABNORMAL HIGH (ref 4.0–10.5)
nRBC: 1.6 % — ABNORMAL HIGH (ref 0.0–0.2)

## 2019-05-23 LAB — COOXEMETRY PANEL
Carboxyhemoglobin: 1.4 % (ref 0.5–1.5)
Methemoglobin: 1.2 % (ref 0.0–1.5)
O2 Saturation: 62.6 %
Total hemoglobin: 11.3 g/dL — ABNORMAL LOW (ref 12.0–16.0)

## 2019-05-23 LAB — IRON AND TIBC
Iron: 20 ug/dL — ABNORMAL LOW (ref 28–170)
Saturation Ratios: 8 % — ABNORMAL LOW (ref 10.4–31.8)
TIBC: 266 ug/dL (ref 250–450)
UIBC: 246 ug/dL

## 2019-05-23 LAB — MAGNESIUM: Magnesium: 2.1 mg/dL (ref 1.7–2.4)

## 2019-05-23 NOTE — Progress Notes (Signed)
FPTS Interim Progress Note  FPTS Continues to follow Leah [name] socially. We will gladly receive her when she is able to return to floor status. Until then we appreciate the Heart Failure team and the care they are providing to our mutual patient.  Guadalupe Dawn MD PGY-3 Family Medicine Resident

## 2019-05-23 NOTE — Plan of Care (Signed)

## 2019-05-23 NOTE — Progress Notes (Addendum)
Advanced Heart Failure Rounding Note  PCP-Cardiologist: Peter Martinique, MD   Subjective:    Sitting up in chair. Denies CP or SOB. Continues to feel weak. Appetite not great.   Creatinine improving slowly 1.7->1.6. Weight up 4 pounds. CVP 5  On milrinone 0.25 Co-ox 63%   PVCs improved with IV amio   Objective:   Weight Range: 63 kg Body mass index is 18.32 kg/m.   Vital Signs:   Temp:  [97.3 F (36.3 C)-98.8 F (37.1 C)] 97.5 F (36.4 C) (02/28 0740) Pulse Rate:  [103-114] 110 (02/28 0900) Resp:  [12-25] 16 (02/28 0900) BP: (104-139)/(45-91) 120/58 (02/28 0900) SpO2:  [93 %-100 %] 99 % (02/28 0900) Weight:  [63 kg] 63 kg (02/28 0500) Last BM Date: (UTA)  Weight change: Filed Weights   05/21/19 0800 05/22/19 0500 05/23/19 0500  Weight: 61.5 kg 63.4 kg 63 kg    Intake/Output:   Intake/Output Summary (Last 24 hours) at 05/23/2019 0931 Last data filed at 05/23/2019 0800 Gross per 24 hour  Intake 729.27 ml  Output 460 ml  Net 269.27 ml      Physical Exam   General: Elderly frail Sitting in the chair.  No resp difficulty HEENT: normal Neck: supple. no JVD. Carotids 2+ bilat; no bruits. No lymphadenopathy or thryomegaly appreciated. Cor: PMI nondisplaced. Irregular  No rubs, gallops or murmurs. Lungs: clear Abdomen: soft, nontender, nondistended. No hepatosplenomegaly. No bruits or masses. Good bowel sounds. Extremities: no cyanosis, clubbing, rash, edema Neuro: alert & orientedx3, cranial nerves grossly intact. moves all 4 extremities w/o difficulty. Affect pleasant  Telemetry   MAT 100-110 frequent PVCs. Personally reviewed  Labs    CBC Recent Labs    05/22/19 0444 05/23/19 0429  WBC 12.2* 10.9*  HGB 11.3* 11.1*  HCT 33.8* 32.7*  MCV 76.0* 74.0*  PLT 52* 54*   Basic Metabolic Panel Recent Labs    05/22/19 0444 05/23/19 0429  NA 129* 128*  K 3.6 4.3  CL 93* 92*  CO2 24 24  GLUCOSE 135* 105*  BUN 37* 39*  CREATININE 1.60* 1.64*    CALCIUM 8.4* 8.2*  MG 2.3 2.1   Liver Function Tests No results for input(s): AST, ALT, ALKPHOS, BILITOT, PROT, ALBUMIN in the last 72 hours. No results for input(s): LIPASE, AMYLASE in the last 72 hours. Cardiac Enzymes No results for input(s): CKTOTAL, CKMB, CKMBINDEX, TROPONINI in the last 72 hours.  BNP: BNP (last 3 results) Recent Labs    05/18/19 1707 05/21/19 1206  BNP >4,500.0* 1,490.6*    ProBNP (last 3 results) No results for input(s): PROBNP in the last 8760 hours.   D-Dimer Recent Labs    05/21/19 1206  DDIMER 13.76*   Hemoglobin A1C No results for input(s): HGBA1C in the last 72 hours. Fasting Lipid Panel No results for input(s): CHOL, HDL, LDLCALC, TRIG, CHOLHDL, LDLDIRECT in the last 72 hours. Thyroid Function Tests No results for input(s): TSH, T4TOTAL, T3FREE, THYROIDAB in the last 72 hours.  Invalid input(s): FREET3  Other results:   Imaging    No results found.   Medications:     Scheduled Medications: . aspirin  81 mg Oral Daily  . atorvastatin  80 mg Oral Daily  . Chlorhexidine Gluconate Cloth  6 each Topical Daily  . clopidogrel  75 mg Oral Daily  . enoxaparin (LOVENOX) injection  30 mg Subcutaneous Q24H  . ezetimibe  10 mg Oral Daily  . feeding supplement (ENSURE ENLIVE)  237 mL Oral TID  BM  . fluticasone  1 puff Inhalation Daily  . gabapentin  200 mg Oral TID  . isosorbide-hydrALAZINE  0.5 tablet Oral Once  . isosorbide-hydrALAZINE  2 tablet Oral TID  . multivitamin with minerals  1 tablet Oral Daily  . spironolactone  12.5 mg Oral Daily    Infusions: . amiodarone 30 mg/hr (05/23/19 0903)  . magnesium sulfate bolus IVPB Stopped (05/19/19 1903)  . milrinone Stopped (05/23/19 0758)    PRN Medications: acetaminophen, albuterol, ALPRAZolam, cyclobenzaprine, ondansetron (ZOFRAN) IV, polyethylene glycol, zolpidem    Assessment/Plan   1. Cardiogenic shock with sever biventricular dysfunction - Previous EF 40% in  10/18 - Echo in ER EF 10% with severe RV dysfunction. Moderate to severe AI - Suspect acute viral myocarditis (recent viral infection) - initial lactate > 6.7 - initial swan #s w/ low output and elevated filling pressures: CO/CI 1.49/0.84, RA 16, PCWP 26, PAPi 0.74. Co-ox 39% - Suspect PVCs playing a role. Now on amio - CVP 5. Volume status stable off diuretics - on Milrinone 0.25 mcg. Co-ox 63% . Cut back milrinone to 0.155mcg.  - Continue Bidil 2 tablet tid  - Continue spiro.  - consider low-dose digoxin and losartan if creatinine continues to improve. Will hold of for now.  - not candidate for mechanical support with PAD and severe AI - Palliative Care discussions ongoing. I think she wil weather the acute storm better than I thought she might but now will have HF on top of her other chronic issues and not sure how good we can get her QOL. I feel we should continue to be aggressive for now but I feel setting limits with DNR/DNI is probably appropriate as heroic measure would not likely lead to a good outcome in someone this ill. I discussed this with erh again today.  - Consider cMRI and cath early this week if creatinine gets to 1.5 or better  2. CAD - s/p CABG with LIMA to LAD and MVR (bioprosthetic) - Trop 407 -> 387->492.  - doubt ACS but cannot exclude ischemia as contributing factor to CM - No chest pain.  - continue asa, Plavix + statin   3. Moderate to severe AI - consider TEE - BCx NGTD  4. Lung cancer s/p resection - stable  5. Severe PAD - s/p left fem-pop and L CEA - bilateral RAS s/p stenting - stable  6. AKI - due to shock - SCr trend 1.9>>2.17>>1.86>>1.97> 1.7 -> 1.6 -> 1.64 - Continue milrinone.   7. AMS - due to shock. Now resolved  8. Hypokalemia/hyponatremia:  - k 4.3  today. - Continue spiro  9. PVCs - Frequent PVCs.  - continue amio. Keep K> 4. Mg > 2.0 - no b-blocker with shock   10. Microcytic anemia - check iron stores. Will likely  need feraheme  PT/OT appreciated with recommendations for HHPT/OT.   Transfer to [facility] today. FPTS to assume care in am  Length of Stay: Walls, MD  05/23/2019, 9:31 AM  Advanced Heart Failure Team Pager [phone] (M-F; 7a - 4p)  Please contact [facility] for night-coverage after hours (4p -7a ) and weekends on [email or web address]

## 2019-05-24 ENCOUNTER — Inpatient Hospital Stay (HOSPITAL_COMMUNITY): Payer: Medicare Other

## 2019-05-24 DIAGNOSIS — Z515 Encounter for palliative care: Secondary | ICD-10-CM

## 2019-05-24 DIAGNOSIS — I5043 Acute on chronic combined systolic (congestive) and diastolic (congestive) heart failure: Secondary | ICD-10-CM

## 2019-05-24 DIAGNOSIS — Z7189 Other specified counseling: Secondary | ICD-10-CM

## 2019-05-24 DIAGNOSIS — R570 Cardiogenic shock: Secondary | ICD-10-CM

## 2019-05-24 LAB — COOXEMETRY PANEL
Carboxyhemoglobin: 1.1 % (ref 0.5–1.5)
Methemoglobin: 0.6 % (ref 0.0–1.5)
O2 Saturation: 52.5 %
Total hemoglobin: 10.6 g/dL — ABNORMAL LOW (ref 12.0–16.0)

## 2019-05-24 LAB — BASIC METABOLIC PANEL
Anion gap: 13 (ref 5–15)
BUN: 37 mg/dL — ABNORMAL HIGH (ref 8–23)
CO2: 24 mmol/L (ref 22–32)
Calcium: 7.9 mg/dL — ABNORMAL LOW (ref 8.9–10.3)
Chloride: 90 mmol/L — ABNORMAL LOW (ref 98–111)
Creatinine, Ser: 1.66 mg/dL — ABNORMAL HIGH (ref 0.44–1.00)
GFR calc Af Amer: 37 mL/min — ABNORMAL LOW (ref >60)
GFR calc non Af Amer: 32 mL/min — ABNORMAL LOW (ref >60)
Glucose, Bld: 173 mg/dL — ABNORMAL HIGH (ref 70–99)
Potassium: 3.7 mmol/L (ref 3.5–5.1)
Sodium: 127 mmol/L — ABNORMAL LOW (ref 135–145)

## 2019-05-24 LAB — CBC
HCT: 30.3 % — ABNORMAL LOW (ref 36.0–46.0)
Hemoglobin: 10.5 g/dL — ABNORMAL LOW (ref 12.0–15.0)
MCH: 25.3 pg — ABNORMAL LOW (ref 26.0–34.0)
MCHC: 34.7 g/dL (ref 30.0–36.0)
MCV: 73 fL — ABNORMAL LOW (ref 80.0–100.0)
Platelets: 56 10*3/uL — ABNORMAL LOW (ref 150–400)
RBC: 4.15 MIL/uL (ref 3.87–5.11)
RDW: 20 % — ABNORMAL HIGH (ref 11.5–15.5)
WBC: 10.4 10*3/uL (ref 4.0–10.5)
nRBC: 2.2 % — ABNORMAL HIGH (ref 0.0–0.2)

## 2019-05-24 LAB — FERRITIN: Ferritin: 120 ng/mL (ref 11–307)

## 2019-05-24 LAB — MAGNESIUM: Magnesium: 1.7 mg/dL (ref 1.7–2.4)

## 2019-05-24 MED ORDER — FERROUS SULFATE 325 (65 FE) MG PO TABS: 325.0000 mg | ORAL_TABLET | ORAL | Status: DC

## 2019-05-24 MED ORDER — GADOBUTROL 1 MMOL/ML IV SOLN
6.0000 mL | Freq: Once | INTRAVENOUS | Status: AC | PRN
  Administered 2019-05-24: 12:00:00 6 mL via INTRAVENOUS

## 2019-05-24 MED ORDER — FUROSEMIDE 10 MG/ML IJ SOLN
80.0000 mg | Freq: Once | INTRAMUSCULAR | Status: AC
  Administered 2019-05-24: 80 mg via INTRAVENOUS
  Filled 2019-05-24: qty 8

## 2019-05-24 MED ORDER — POTASSIUM CHLORIDE 20 MEQ/15ML (10%) PO SOLN
40.0000 meq | Freq: Once | ORAL | Status: AC
  Administered 2019-05-24: 13:00:00 40 meq via ORAL
  Filled 2019-05-24: qty 30

## 2019-05-24 MED ORDER — SODIUM CHLORIDE 0.9 % IV SOLN
510.0000 mg | INTRAVENOUS | Status: AC
  Administered 2019-05-24 – 2019-05-31 (×2): 510 mg via INTRAVENOUS
  Filled 2019-05-24 (×2): qty 17

## 2019-05-24 MED ORDER — MAGNESIUM SULFATE 2 GM/50ML IV SOLN
2.0000 g | Freq: Once | INTRAVENOUS | Status: AC
  Administered 2019-05-24: 2 g via INTRAVENOUS
  Filled 2019-05-24: qty 50

## 2019-05-24 NOTE — Progress Notes (Signed)
Spoke with patient's son Amorette Charrette.  He notes that patient lives with him and his sister.  He and his sister, have discussed her dispo plans and would like for her to consider SNF.  Will place orders for this.  [place] Constable, D.O.  PGY-2 Family Medicine  05/24/2019 2:30 PM

## 2019-05-24 NOTE — Progress Notes (Addendum)
Advanced Heart Failure Rounding Note  PCP-Cardiologist: Peter Martinique, MD   Subjective:   Yesterday milrinone was cut back to 0.125 mcg. CO-OX 52.5%.   Off diuretics since 2/25.  CVP back up to 12.   Remains on amio drip for MAT and frequent PVCs. Feels weak.   Creatinine 1.6    Denies SOB. Denies chest pain. Poor appetite.    Objective:   Weight Range: 66.5 kg Body mass index is 19.34 kg/m.   Vital Signs:   Temp:  [97.6 F (36.4 C)-98.5 F (36.9 C)] 98 F (36.7 C) (03/01 0759) Pulse Rate:  [95-111] 104 (03/01 0759) Resp:  [15-29] 15 (03/01 0759) BP: (119-139)/(55-82) 132/74 (03/01 0759) SpO2:  [88 %-99 %] 97 % (03/01 0837) Weight:  [66.5 kg] 66.5 kg (03/01 0600) Last BM Date: 05/17/19  Weight change: Filed Weights   05/22/19 0500 05/23/19 0500 05/24/19 0600  Weight: 63.4 kg 63 kg 66.5 kg    Intake/Output:   Intake/Output Summary (Last 24 hours) at 05/24/2019 0945 Last data filed at 05/24/2019 0400 Gross per 24 hour  Intake 790.64 ml  Output 95 ml  Net 695.64 ml      Physical Exam  CVP 12 personally checked.  General:  Frail elderly woman. Tall thin No resp difficulty HEENT: normal Neck: supple. JVP 11-12 . Carotids 2+ bilat; no bruits. No lymphadenopathy or thryomegaly appreciated. Cor: PMI nondisplaced. Irregular rate & rhythm. No rubs, or murmurs. +S3  Lungs: clear on 3 liters  Abdomen: soft, nontender, nondistended. No hepatosplenomegaly. No bruits or masses. Good bowel sounds. Extremities: no cyanosis, clubbing, rash, edema Neuro: alert & orientedx3, cranial nerves grossly intact. moves all 4 extremities w/o difficulty. Affect pleasant    Telemetry   MAT 100s with PVCs personally checked.   Labs    CBC Recent Labs    05/23/19 0429 05/24/19 0546  WBC 10.9* 10.4  HGB 11.1* 10.5*  HCT 32.7* 30.3*  MCV 74.0* 73.0*  PLT 54* 56*   Basic Metabolic Panel Recent Labs    05/23/19 0429 05/24/19 0546  NA 128* 127*  K 4.3 3.7  CL 92*  90*  CO2 24 24  GLUCOSE 105* 173*  BUN 39* 37*  CREATININE 1.64* 1.66*  CALCIUM 8.2* 7.9*  MG 2.1 1.7   Liver Function Tests No results for input(s): AST, ALT, ALKPHOS, BILITOT, PROT, ALBUMIN in the last 72 hours. No results for input(s): LIPASE, AMYLASE in the last 72 hours. Cardiac Enzymes No results for input(s): CKTOTAL, CKMB, CKMBINDEX, TROPONINI in the last 72 hours.  BNP: BNP (last 3 results) Recent Labs    05/18/19 1707 05/21/19 1206  BNP >4,500.0* 1,490.6*    ProBNP (last 3 results) No results for input(s): PROBNP in the last 8760 hours.   D-Dimer Recent Labs    05/21/19 1206  DDIMER 13.76*   Hemoglobin A1C No results for input(s): HGBA1C in the last 72 hours. Fasting Lipid Panel No results for input(s): CHOL, HDL, LDLCALC, TRIG, CHOLHDL, LDLDIRECT in the last 72 hours. Thyroid Function Tests No results for input(s): TSH, T4TOTAL, T3FREE, THYROIDAB in the last 72 hours.  Invalid input(s): FREET3  Other results:   Imaging    No results found.   Medications:     Scheduled Medications: . aspirin  81 mg Oral Daily  . atorvastatin  80 mg Oral Daily  . Chlorhexidine Gluconate Cloth  6 each Topical Daily  . clopidogrel  75 mg Oral Daily  . enoxaparin (LOVENOX) injection  30  mg Subcutaneous Q24H  . ezetimibe  10 mg Oral Daily  . feeding supplement (ENSURE ENLIVE)  237 mL Oral TID BM  . ferrous sulfate  325 mg Oral QODAY  . fluticasone  1 puff Inhalation Daily  . gabapentin  200 mg Oral TID  . isosorbide-hydrALAZINE  0.5 tablet Oral Once  . isosorbide-hydrALAZINE  2 tablet Oral TID  . multivitamin with minerals  1 tablet Oral Daily  . potassium chloride  40 mEq Oral Once  . spironolactone  12.5 mg Oral Daily    Infusions: . amiodarone 30 mg/hr (05/24/19 0827)  . magnesium sulfate bolus IVPB    . milrinone 0.125 mcg/kg/min (05/24/19 0400)    PRN Medications: acetaminophen, albuterol, ALPRAZolam, cyclobenzaprine, ondansetron (ZOFRAN) IV,  polyethylene glycol, zolpidem    Assessment/Plan   1. Cardiogenic shock with sever biventricular dysfunction - Previous EF 40% in 10/18 - Echo in ER EF 10% with severe RV dysfunction. Moderate to severe AI - Suspect acute viral myocarditis (recent viral infection) - initial lactate > 6.7 - initial swan #s w/ low output and elevated filling pressures: CO/CI 1.49/0.84, RA 16, PCWP 26, PAPi 0.74. Co-ox 39% - Suspect PVCs playing a role. Now on amio - CO-OX 52.5%. on milrinone 0.125 mcg. Continue for now.  - CVP trending up to 12. Give 80 mg IV lasix today.   - Continue Bidil 2 tablet tid  - Continue spiro.  - consider low-dose digoxin - Hold off on losartan with elevated creatinine.  - not candidate for mechanical support with PAD and severe AI - Palliative Care discussions ongoing. I think she wil weather the acute storm better than I thought she might but now will have HF on top of her other chronic issues and not sure how good we can get her QOL. I feel we should continue to be aggressive for now but I feel setting limits with DNR/DNI is probably appropriate as heroic measure would not likely lead to a good outcome in someone this ill. I discussed this with he again today.  - cMRI today.   2. CAD - s/p CABG with LIMA to LAD and MVR (bioprosthetic) - Trop 407 -> 387->492.  - doubt ACS but cannot exclude ischemia as contributing factor to CM - No chest pain.  - continue asa, Plavix + statin   3. Moderate to severe AI - consider TEE - BCx NGTD  4. Lung cancer s/p resection - stable  5. Severe PAD - s/p left fem-pop and L CEA - bilateral RAS s/p stenting - stable  6. AKI - due to shock - Creatinine peaked at 2. Todays creatinine 1.7.  - Continue milrinone.   7. AMS - due to shock. Now resolved  8. Hypokalemia/hyponatremia:  - K 3.7  -Sodium down to 127 today. If continue to fall may need tolvaptan.  - Restrict free water.  - Continue spiro  9. PVCs - Frequent  PVCs.  - continue amio. Keep K> 4. Mg > 2.0 - no b-blocker with shock   10. Microcytic anemia - Iron stores low. Give feraheme.   Length of Stay: 6  Amy Clegg, NP  05/24/2019, 9:45 AM  Advanced Heart Failure Team Pager [phone] (M-F; [place])  Please contact [facility] for night-coverage after hours (4p -7a ) and weekends on [email or web address]   Patient seen and examined with the above-signed Advanced Practice Provider and/or Housestaff. I personally reviewed laboratory data, imaging studies and relevant notes. I independently examined the patient and formulated  the important aspects of the plan. I have edited the note to reflect any of my changes or salient points. I have personally discussed the plan with the patient and/or family.  She remains frail and weak. Milrinone weaned down to 0.125 yesterday. Co-ox has dropped to 52%. CVP up. Will not wean milrinone farther today. Agree with IV lasix. Continue Bidil and spiro.   Will plan cMRI today to evaluate etiology of CM further.   At baseline very frail and tenuous. D/w FPTS who will assume primary. Appreciate their support with non-cardiac issues and disposition.   Glori Bickers, MD  10:43 AM

## 2019-05-24 NOTE — Progress Notes (Signed)
  Speech Language Pathology Treatment: Dysphagia  Patient Details Name: Leah [name] MRN: [id number] DOB: [birth year] Today's Date: 05/24/2019 Time: 0600-4599 SLP Time Calculation (min) (ACUTE ONLY): 15 min  Assessment / Plan / Recommendation Clinical Impression  Pt encountered in bed, HOB raised so patient sitting upright. Pt on tele, receiving supplemental oxygen via [place].  Patient edentulous, she reports she has dentures, but they are at home and not available here in the hospital. Pt seen with thin liquids (water) via cup and straw sips, patient with good oral acceptance, swallow initiation appeared timely, no overt s/sx aspiration observed. Pt seen with chopped peaches: adequate acceptance, mildly prolonged mastication 2/2 edentulous status, but able to form a cohesive bolus given additional time. Adequate oral clearance achieved following the swallow. No overt s/sx aspiration. ST offered regular solids (graham crackers), but patient declined, stating "it's too tough to chew." ST provided edu to pt re: importance of oral care, sitting upright for PO intake and taking small bites and sips. Pt verbalized understanding. Recommend continuation of dysphagia 3 solids (2/2 dentition) and thin liquids. Pt verbalized understanding and agreement. ST to sign off, please re-consult should patient demonstrate s/sx dysphagia or new issues arise.  D/w RN: RN reports patient is tolerating D3 solids/thin liquids well. RN verbalized understanding re: ST signing off for now.    HPI HPI: Pt is a 69 y.o. female who presented with altered mental status. PMH is significant for CAD, HTN, HLD, PVD, HFpEF, CKD, MV replacement, Hx bronchogenic lung cancer, and tobacco use. Chest x-ray 2/24: Mild congestive heart failure, slightly worsened.       SLP Plan  All goals met;Discharge SLP treatment due to (comment)       Recommendations  Diet recommendations: Dysphagia 3 (mechanical soft);Thin liquid Medication  Administration: Crushed with puree Supervision: Intermittent supervision to cue for compensatory strategies Compensations: Slow rate;Small sips/bites Postural Changes and/or Swallow Maneuvers: Seated upright 90 degrees                SLP Visit Diagnosis: Dysphagia, unspecified (R13.10) Plan: All goals met;Discharge SLP treatment due to (comment)       Theresa 05/24/2019, 4:06 PM  Marina Goodell, M.Ed., [place] [facility] Acute Rehabilitation [phone]: Acute Rehab office [phone] - pager

## 2019-05-24 NOTE — Progress Notes (Signed)
Family Medicine Teaching Service Daily Progress Note Intern Pager: [phone]  Patient name: Leah [name] [facility] Medical record number: [id number] Date of birth: Dec 22, 1950 Age: 69 y.o. Gender: female  Primary Care Provider: Guadalupe Dawn, MD Consultants: Heart Failure Code Status: Full   Pt Overview and Major Events to Date:  2/23 admitted in cardiogenic shock to ICU 2/28 transferred to floor   Assessment and Plan: Leah [name] a 69 y.o.femalewho presented with altered mental status, found to be in cardiogenic shock 2/2 viral cardiomyopathy. PMH is significant for CAD, HTN, HLD, PVD, HFpEF (now HFrEF EF 10%), CKD, MV replacement, Hx bronchogenic lung cancer, and tobacco use.  Cardiogenic Shock with biventricular dysfunction, secondary to acute viral myocarditis Has been monitored in ICU and treated by Dr. Haroldine [name].  Transferred to floor on 2/28.  Currently on milrinone gtt 0.125 mcg/kg/min.  For this admission with EF decreased from 40 to 10% with severe RV dysfunction and moderate to severe AI.  Currently on 3 L per [place], no home O2.  Weight up 3.5 kg from yesterday. - f/u HF, appreciate recs - cont bidil TID - cont spiro 12.5 mg QD - cont milrinone per cards - planning for cardiac MRI today per Dr. Haroldine [name] - possible cath during week pending Cr if 1.5 or better  CAD S/P CABG with LIMA to LAD and MVR (bioprosthetic).  Trop 629>528>413.  Home medications include Bildil, Lasix, Nitrostat, Metoprolol, ASA, Plavix.  Consider could be contributing to cardiomyopathy. - cont plan per above - cont ASA, Plavix - cont lipitor 80mg , zetia 10 mg - holding BB in setting of cardiogenic shock, on amio instead - holding lasix, now on spiro  Moderate to severe AI Blood cultures no growth at 5 days. - cont cards plan per above  Goals of care Dr. Haroldine [name] reports that he has had conversations with family in regards to palliative care and they continue to want full scope care.   Overall, patient will need aggressive rehab and nutrition assistance.  Lives with her daughter. - PT/OT: recommend SNF, will speak with family regarding plan - nutrition consult - s/p palliative discussions  Lung cancer s/p resection Currently on 3 liters per [place].  No home O2. - stable, cardiac plan per above  Severe PAD  S/p left femoropopliteal and L CEA - bilateral RAS s/p stenting - stable - cont statin and zetia per above  AKI On CKD: Improving Baseline appears to be 1.0-1.3.  2/2 cardiogenic shock.  Cr 2.33>>1.66.  Improving with treatment of cardiogenic shock. - cont milrinone per above  AMS Initial problem on presentation.  2/2 cardiogenic shock.  Now resolved.  Currently A&Ox3. - cont to monitor for changes in mental status  Hypervolemic hyponatremia 136>>127.  Likely 2/2 HF. - cont spironolactone - monitor fluid status  Frequent PVCs Improving with amiodarone.  Goal to keep K greater than 4 and mag greater than 2.  K 3.7, Mag 1.7. - replete K 40 mEq x1 - replete Mag sulfate 2g x1 - monitor BMP and Mag daily -Continue amiodarone -Avoid beta-blocker secondary to shock  Iron Deficiency Anemia  Iron 20, Sat 8.  Hgb 10.5, MCV 73.  On admission Hgb was 14.6.  Last colonoscopy 2018 by Dr. Havery Moros with tubular adenoma and hyperplastic polyp removed.  Also noted diverticulosis.  Due for repeat colonoscopy in March 2021.  Denies any hematochezia or melena. - start ferrous sulfate QOD - outpatient colonoscopy - transfusion threshold 8 given CAD   FEN/GI: Dysphagia 3 Diet PPx:  Lovenox  Disposition: pending cont cardiac workup, planning for SNF, but will confirm with family  Subjective:  Patient moved to floor yesterday by HF team.     Patient reports that she is doing well today.  Denies complaints this AM.  States that her breathing is doing well and she is eating well.  Denies hematochezia or melena.  Objective: Temp:  [97.6 F (36.4 C)-98.5 F (36.9 C)] 98  F (36.7 C) (03/01 0759) Pulse Rate:  [95-111] 104 (03/01 0759) Resp:  [15-29] 15 (03/01 0759) BP: (119-139)/(55-82) 132/74 (03/01 0759) SpO2:  [88 %-99 %] 97 % (03/01 0837) Weight:  [66.5 kg] 66.5 kg (03/01 0600)  Physical Exam:  General: 69 y.o. female in NAD, frail Cardio: tachycardic, irregular, systolic murmur at RUSB Lungs: no increased work of breathing on 3 L per , CTAB Skin: warm and dry Extremities: Trace BLE edema   Laboratory: Recent Labs  Lab 05/22/19 0444 05/23/19 0429 05/24/19 0546  WBC 12.2* 10.9* 10.4  HGB 11.3* 11.1* 10.5*  HCT 33.8* 32.7* 30.3*  PLT 52* 54* 56*   Recent Labs  Lab 05/19/19 0204 05/19/19 0331 05/22/19 0444 05/23/19 0429 05/24/19 0546  NA 139   < > 129* 128* 127*  K 2.8*   < > 3.6 4.3 3.7  CL 101   < > 93* 92* 90*  CO2 24   < > 24 24 24   BUN 35*   < > 37* 39* 37*  CREATININE 2.17*   < > 1.60* 1.64* 1.66*  CALCIUM 8.4*   < > 8.4* 8.2* 7.9*  PROT 5.2*  --   --   --   --   BILITOT 3.5*  --   --   --   --   ALKPHOS 75  --   --   --   --   ALT 24  --   --   --   --   AST 31  --   --   --   --   GLUCOSE 182*   < > 135* 105* 173*   < > = values in this interval not displayed.     Imaging/Diagnostic Tests: No results found.  Leah [name], Leah Raisin, DO 05/24/2019, 9:12 AM PGY-2, [place] Intern pager: [phone], text pages welcome

## 2019-05-24 NOTE — Plan of Care (Signed)
  Problem: Education: Goal: Knowledge of General Education information will improve Description: Including pain rating scale, medication(s)/side effects and non-pharmacologic comfort measures Outcome: Progressing   Problem: Clinical Measurements: Goal: Will remain free from infection Outcome: Progressing Goal: Respiratory complications will improve Outcome: Progressing Goal: Cardiovascular complication will be avoided Outcome: Progressing   Problem: Coping: Goal: Level of anxiety will decrease Outcome: Progressing   Problem: Safety: Goal: Ability to remain free from injury will improve Outcome: Progressing

## 2019-05-24 NOTE — Progress Notes (Signed)
Occupational Therapy Treatment Patient Details Name: Leah [name] MRN: [id number] DOB: [birth year] Today's Date: 05/24/2019    History of present illness 69 y.o. 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 Pt brought to the ED for AMS, now with concern for cardiogenic shock.   OT comments  Pt progressing to OOB ADL to [facility]. Pt minA for transfers and modA for toilet hygiene/pericare due to instability on standing. Pt would benefit frm continued OT skilled services in SNF setting. Pt has good rehab potential. OT following acutely.    Follow Up Recommendations  SNF;Supervision/Assistance - 24 hour    Equipment Recommendations  None recommended by OT    Recommendations for Other Services      Precautions / Restrictions Precautions Precautions: Fall Restrictions Weight Bearing Restrictions: No       Mobility Bed Mobility Overal bed mobility: Needs Assistance Bed Mobility: Supine to Sit     Supine to sit: Min assist        Transfers Overall transfer level: Needs assistance Equipment used: 1 person hand held assist;Rolling walker (2 wheeled) Transfers: Sit to/from Stand Sit to Stand: Min assist         General transfer comment: Pt sit to stand with RW and assist for power up    Balance Overall balance assessment: Needs assistance Sitting-balance support: Single extremity supported;Feet supported Sitting balance-[name]: Fair       Standing balance-[name]: Poor                             ADL either performed or assessed with clinical judgement   ADL Overall ADL's : Needs assistance/impaired                         Toilet Transfer: Minimal assistance;Stand-pivot;BSC;RW   Toileting- Clothing Manipulation and Hygiene: Moderate assistance;Cueing for  safety;Sitting/lateral lean;Sit to/from stand Toileting - Clothing Manipulation Details (indicate cue type and reason): pt unable to let go of RW for LB ADL     Functional mobility during ADLs: Minimal assistance;Cueing for safety;Rolling walker General ADL Comments: Pt's focus for session for ADL OOB to commode for peri care/toilet hygiene.      Vision Baseline Vision/History: No visual deficits Vision Assessment?: No apparent visual deficits   Perception     Praxis      Cognition Arousal/Alertness: Awake/alert Behavior During Therapy: WFL for tasks assessed/performed Overall Cognitive Status: Impaired/Different from baseline Area of Impairment: Safety/judgement;Awareness;Problem solving                     Memory: Decreased short-term memory   Safety/Judgement: Decreased awareness of deficits   Problem Solving: Slow processing;Requires verbal cues General Comments: Following commands today        Exercises     Shoulder Instructions       General Comments O2 >90% on 2L.    Pertinent Vitals/ Pain       Pain Assessment: Faces Pain Score: 2  Faces Pain Scale: Hurts a little bit Pain Location: back Pain Descriptors / Indicators: Aching Pain Intervention(s): Monitored during session  Home Living  Prior Functioning/Environment Level of Independence: Independent            Frequency  Min 2X/week        Progress Toward Goals  OT Goals(current goals can now be found in the care plan section)     Acute Rehab OT Goals Patient Stated Goal: To return to independent PLOF OT Goal Formulation: With patient Time For Goal Achievement: 06/03/19 Potential to Achieve Goals: Good  Plan      Co-evaluation                 AM-PAC OT "6 Clicks" Daily Activity     Outcome Measure   Help from another person eating meals?: None Help from another person taking care of personal grooming?:  None Help from another person toileting, which includes using toliet, bedpan, or urinal?: A Little Help from another person bathing (including washing, rinsing, drying)?: A Little Help from another person to put on and taking off regular upper body clothing?: A Little Help from another person to put on and taking off regular lower body clothing?: A Little 6 Click Score: 20    End of Session Equipment Utilized During Treatment: Rolling walker;Oxygen  OT Visit Diagnosis: Unsteadiness on feet (R26.81);Other abnormalities of gait and mobility (R26.89);Muscle weakness (generalized) (M62.81)   Activity Tolerance Patient tolerated treatment well   Patient Left in bed;with call bell/phone within reach;with bed alarm set   Nurse Communication Mobility status        Time: 9675-9163 OT Time Calculation (min): 32 min  Charges: OT General Charges $OT Visit: 1 Visit OT Treatments $Self Care/Home Management : 8-22 mins $Therapeutic Activity: 8-22 mins  Jefferey Pica, OTR/L Acute Rehabilitation Services Pager: [phone] Office: [phone]    Christyna Letendre C 05/24/2019, 4:43 PM

## 2019-05-25 DIAGNOSIS — I214 Non-ST elevation (NSTEMI) myocardial infarction: Secondary | ICD-10-CM

## 2019-05-25 DIAGNOSIS — Z515 Encounter for palliative care: Secondary | ICD-10-CM

## 2019-05-25 LAB — CBC
HCT: 30.2 % — ABNORMAL LOW (ref 36.0–46.0)
Hemoglobin: 10.7 g/dL — ABNORMAL LOW (ref 12.0–15.0)
MCH: 25.5 pg — ABNORMAL LOW (ref 26.0–34.0)
MCHC: 35.4 g/dL (ref 30.0–36.0)
MCV: 71.9 fL — ABNORMAL LOW (ref 80.0–100.0)
Platelets: 71 10*3/uL — ABNORMAL LOW (ref 150–400)
RBC: 4.2 MIL/uL (ref 3.87–5.11)
RDW: 19.8 % — ABNORMAL HIGH (ref 11.5–15.5)
WBC: 10.7 10*3/uL — ABNORMAL HIGH (ref 4.0–10.5)
nRBC: 2.6 % — ABNORMAL HIGH (ref 0.0–0.2)

## 2019-05-25 LAB — COOXEMETRY PANEL
Carboxyhemoglobin: 1.1 % (ref 0.5–1.5)
Methemoglobin: 1 % (ref 0.0–1.5)
O2 Saturation: 52.7 %
Total hemoglobin: 10.8 g/dL — ABNORMAL LOW (ref 12.0–16.0)

## 2019-05-25 LAB — BASIC METABOLIC PANEL
Anion gap: 14 (ref 5–15)
BUN: 41 mg/dL — ABNORMAL HIGH (ref 8–23)
CO2: 23 mmol/L (ref 22–32)
Calcium: 8.3 mg/dL — ABNORMAL LOW (ref 8.9–10.3)
Chloride: 88 mmol/L — ABNORMAL LOW (ref 98–111)
Creatinine, Ser: 1.86 mg/dL — ABNORMAL HIGH (ref 0.44–1.00)
GFR calc Af Amer: 32 mL/min — ABNORMAL LOW (ref >60)
GFR calc non Af Amer: 28 mL/min — ABNORMAL LOW (ref >60)
Glucose, Bld: 104 mg/dL — ABNORMAL HIGH (ref 70–99)
Potassium: 3.4 mmol/L — ABNORMAL LOW (ref 3.5–5.1)
Sodium: 125 mmol/L — ABNORMAL LOW (ref 135–145)

## 2019-05-25 LAB — MAGNESIUM: Magnesium: 2.1 mg/dL (ref 1.7–2.4)

## 2019-05-25 LAB — GLUCOSE, CAPILLARY
Glucose-Capillary: 111 mg/dL — ABNORMAL HIGH (ref 70–99)
Glucose-Capillary: 132 mg/dL — ABNORMAL HIGH (ref 70–99)

## 2019-05-25 MED ORDER — POTASSIUM CHLORIDE CRYS ER 20 MEQ PO TBCR
40.0000 meq | EXTENDED_RELEASE_TABLET | Freq: Once | ORAL | Status: AC
  Administered 2019-05-25: 12:00:00 40 meq via ORAL
  Filled 2019-05-25: qty 2

## 2019-05-25 MED ORDER — POTASSIUM CHLORIDE CRYS ER 20 MEQ PO TBCR
20.0000 meq | EXTENDED_RELEASE_TABLET | Freq: Once | ORAL | Status: AC
  Administered 2019-05-25: 15:00:00 20 meq via ORAL
  Filled 2019-05-25: qty 1

## 2019-05-25 NOTE — Plan of Care (Signed)

## 2019-05-25 NOTE — Progress Notes (Signed)
Physical Therapy Treatment Patient Details Name: Leah [name] MRN: [id number] DOB: 11/28/1950 Today's Date: 05/25/2019    History of Present Illness 69 y.o. 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 Pt brought to the ED for AMS, now with concern for cardiogenic shock.    PT Comments    Pt with increased activity tolerance and required less assist for transfers today. Pt amb 40'x2 with RW and minA. Pt progressing towards all goals. If family can provide 24/7 assist pt would be safe to d/c home with [facility] services, RW and 24/7 assist. Acute PT to cont to follow.    Follow Up Recommendations  SNF;Supervision/Assistance - 24 hour     Equipment Recommendations  Rolling walker with 5" wheels    Recommendations for Other Services       Precautions / Restrictions Precautions Precautions: Fall Restrictions Weight Bearing Restrictions: No    Mobility  Bed Mobility Overal bed mobility: Needs Assistance Bed Mobility: Supine to Sit     Supine to sit: Min assist     General bed mobility comments: HOB elevated, minA for trunk elevation, pt able to manage LEs off EOB  Transfers Overall transfer level: Needs assistance Equipment used: Rolling walker (2 wheeled) Transfers: Sit to/from Stand Sit to Stand: Mod assist         General transfer comment: minA from elevated surface, increased time, assist for powering up, pt 6'1"  Ambulation/Gait Ambulation/Gait assistance: +2 safety/equipment;Min assist Gait Distance (Feet): 40 Feet(x2) Assistive device: Rolling walker (2 wheeled) Gait Pattern/deviations: Decreased stride length;Step-through pattern;Trunk flexed Gait velocity: dec Gait velocity interpretation: <1.31 ft/sec, indicative of household ambulator General Gait Details: pt with no knee buckling this  date, slow but steady, minA for walker management during turns, required seated 2 min rest break s/p 40'   Stairs             Wheelchair Mobility    Modified Rankin (Stroke Patients Only)       Balance Overall balance assessment: Needs assistance Sitting-balance support: No upper extremity supported;Feet supported Sitting balance-[name]: Fair     Standing balance support: Bilateral upper extremity supported Standing balance-[name]: Poor Standing balance comment: requires use of RW for safe standing                            Cognition Arousal/Alertness: Awake/alert Behavior During Therapy: WFL for tasks assessed/performed Overall Cognitive Status: Within Functional Limits for tasks assessed                                 General Comments: pt following commands appropriately, sequencing well, much improved since 2/27      Exercises      General Comments General comments (skin integrity, edema, etc.): VSS, Pt SpO2 >96% on 3Lo2 via [place]      Pertinent Vitals/Pain Pain Assessment: No/denies pain    Home Living                      Prior Function            PT Goals (current goals can now be found in the care plan section) Progress towards PT goals: Progressing toward goals  Frequency    Min 3X/week      PT Plan Current plan remains appropriate    Co-evaluation              AM-PAC PT "6 Clicks" Mobility   Outcome Measure  Help needed turning from your back to your side while in a flat bed without using bedrails?: A Little Help needed moving from lying on your back to sitting on the side of a flat bed without using bedrails?: A Little Help needed moving to and from a bed to a chair (including a wheelchair)?: A Little Help needed standing up from a chair using your arms (e.g., wheelchair or bedside chair)?: A Little Help needed to walk in hospital room?: A Little Help needed climbing 3-5 steps  with a railing? : A Lot 6 Click Score: 17    End of Session Equipment Utilized During Treatment: Gait belt;Oxygen(3LO2 via [address] [place]) Activity Tolerance: Patient limited by fatigue Patient left: in chair;with call bell/phone within reach Nurse Communication: Mobility status PT Visit Diagnosis: Unsteadiness on feet (R26.81)     Time: 0981-1914 PT Time Calculation (min) (ACUTE ONLY): 28 min  Charges:  $Gait Training: 23-37 mins                     Kittie Plater, PT, DPT Acute Rehabilitation Services Pager #: [phone] Office #: [phone]    Berline Lopes 05/25/2019, 11:20 AM

## 2019-05-25 NOTE — Progress Notes (Addendum)
Advanced Heart Failure Rounding Note  PCP-Cardiologist: Peter Martinique, MD   Subjective:   CO-OX 53% on milrinone 0.125 mcg.   CMRI-Findings consistent with acute myocarditis. LVEF 11% RVEF 18% (severe biventricular dysfunction)   Denies SOB. Weak.   Objective:   Weight Range: 63.5 kg Body mass index is 18.47 kg/m.   Vital Signs:   Temp:  [97.4 F (36.3 C)-97.8 F (36.6 C)] 97.5 F (36.4 C) (03/02 0751) Pulse Rate:  [92-103] 96 (03/02 0751) Resp:  [15-21] 17 (03/02 0751) BP: (123-140)/(59-73) 123/67 (03/02 0302) SpO2:  [92 %-97 %] 97 % (03/02 0802) Weight:  [63.5 kg] 63.5 kg (03/02 0302) Last BM Date: 05/17/19  Weight change: Filed Weights   05/23/19 0500 05/24/19 0600 05/25/19 0302  Weight: 63 kg 66.5 kg 63.5 kg    Intake/Output:   Intake/Output Summary (Last 24 hours) at 05/25/2019 0845 Last data filed at 05/25/2019 0700 Gross per 24 hour  Intake 647.74 ml  Output 1625 ml  Net -977.26 ml      Physical Exam  CVP 8 General:  Thin. Frail and weak appearing   No resp difficulty HEENT: normal anicteric  Neck: supple. JVP 7-8 . Carotids 2+ bilat; no bruits. No lymphadenopathy or thryomegaly appreciated. Cor: PMI nondisplaced. Mildly irregular rate & rhythm. No rubs, or murmurs. +S3  Lungs: clear no wheeze Abdomen: soft, nontender, nondistended. No hepatosplenomegaly. No bruits or masses. Good bowel sounds. Extremities: no cyanosis, clubbing, rash, edema Neuro: alert & oriented x 3, cranial nerves grossly intact. moves all 4 extremities w/o difficulty. Affect pleasant   Telemetry   MAT 100s personally reviewed.   Labs    CBC Recent Labs    05/24/19 0546 05/25/19 0457  WBC 10.4 10.7*  HGB 10.5* 10.7*  HCT 30.3* 30.2*  MCV 73.0* 71.9*  PLT 56* 71*   Basic Metabolic Panel Recent Labs    05/24/19 0546 05/25/19 0457  NA 127* 125*  K 3.7 3.4*  CL 90* 88*  CO2 24 23  GLUCOSE 173* 104*  BUN 37* 41*  CREATININE 1.66* 1.86*  CALCIUM 7.9* 8.3*    MG 1.7 2.1   Liver Function Tests No results for input(s): AST, ALT, ALKPHOS, BILITOT, PROT, ALBUMIN in the last 72 hours. No results for input(s): LIPASE, AMYLASE in the last 72 hours. Cardiac Enzymes No results for input(s): CKTOTAL, CKMB, CKMBINDEX, TROPONINI in the last 72 hours.  BNP: BNP (last 3 results) Recent Labs    05/18/19 1707 05/21/19 1206  BNP >4,500.0* 1,490.6*    ProBNP (last 3 results) No results for input(s): PROBNP in the last 8760 hours.   D-Dimer No results for input(s): DDIMER in the last 72 hours. Hemoglobin A1C No results for input(s): HGBA1C in the last 72 hours. Fasting Lipid Panel No results for input(s): CHOL, HDL, LDLCALC, TRIG, CHOLHDL, LDLDIRECT in the last 72 hours. Thyroid Function Tests No results for input(s): TSH, T4TOTAL, T3FREE, THYROIDAB in the last 72 hours.  Invalid input(s): FREET3  Other results:   Imaging    MR CARDIAC MORPHOLOGY W WO CONTRAST  Result Date: 05/25/2019 CLINICAL DATA:  45F presents with cardiogenic shock EXAM: CARDIAC MRI TECHNIQUE: The patient was scanned on a 1.5 Tesla Siemens magnet. A dedicated cardiac coil was used. Functional imaging was done using Fiesta sequences. 2,3, and 4 chamber views were done to assess for RWMA's. Modified Simpson's rule using a short axis stack was used to calculate an ejection fraction on a dedicated work Conservation officer, nature.  The patient received 6 cc of Gadavist. After 10 minutes inversion recovery sequences were used to assess for infiltration and scar tissue. CONTRAST:  6 cc  of Gadavist FINDINGS: Left ventricle: -Severe dilatation -Severe systolic dysfunction -Elevated T2 values, particularly in the basal inferolateral wall (76ms in basal IL) -Elevated ECV (36% apical, 37% mid, 44% basal) -Mid anteroseptal midwall LGE LV EF:  11% (Normal 56-78%) Absolute volumes: LV EDV: 27mL (Normal 52-141 mL) LV ESV: 215mL (Normal 13-51 mL) LV SV: 49mL (Normal 33-97 mL) CO: 2.8L/min  (Normal 2.7-6.0 L/min) Indexed volumes: LV EDV: 140mL/sq-m (Normal 41-81 mL/sq-m) LV ESV: 132mL/sq-m (Normal 12-21 mL/sq-m) LV SV: 40mL/sq-m (Normal 26-56 mL/sq-m) CI: 1.5L/min/sq-m (Normal 1.8-3.8 L/min/sq-m) Right ventricle: -Mild dilatation -Severe systolic dysfunction RV EF: 18% (Normal 47-80%) Absolute volumes: RV EDV: 153mL (Normal 58-154 mL) RV ESV: 160mL (Normal 12-68 mL) RV SV: 45mL (Normal 35-98 mL) CO: 2.6L/min (Normal 2.7-6 L/min) Indexed volumes: RV EDV: 19mL/sq-m (Normal 48-87 mL/sq-m) RV ESV: 7mL/sq-m (Normal 11-28 mL/sq-m) RV SV: 15mL/sq-m (Normal 27-57 mL/sq-m) CI: 1.4L/min/sq-m (Normal 1.8-3.8 L/min/sq-m) Left atrium: Mild enlargement Right atrium: Moderate enlargement Mitral valve: S/p bioprosthetic MV replacement Aortic valve: Regurgitation not well visualized due to artifact from bioprosthetic mitral valve Tricuspid valve: Mild regurgitation Pericardium: Normal IMPRESSION: 1. Findings consistent with acute myocarditis, including elevated native T1/ECV, elevated T2, and mid anteroseptal midwall late gadolinium enhancement 2. Severe LV dilatation with severe systolic dysfunction. EF calculated at 11%, though there was artifact due to ectopy and difficulty with breath holds that affects quantification of volumes/EF 3.  Normal RV size with severe systolic dysfunction Electronically Signed   By: Oswaldo Milian MD   On: 05/25/2019 00:56     Medications:     Scheduled Medications: . aspirin  81 mg Oral Daily  . atorvastatin  80 mg Oral Daily  . Chlorhexidine Gluconate Cloth  6 each Topical Daily  . clopidogrel  75 mg Oral Daily  . enoxaparin (LOVENOX) injection  30 mg Subcutaneous Q24H  . ezetimibe  10 mg Oral Daily  . feeding supplement (ENSURE ENLIVE)  237 mL Oral TID BM  . fluticasone  1 puff Inhalation Daily  . gabapentin  200 mg Oral TID  . isosorbide-hydrALAZINE  2 tablet Oral TID  . multivitamin with minerals  1 tablet Oral Daily  . spironolactone  12.5 mg Oral Daily      Infusions: . amiodarone 30 mg/hr (05/25/19 0700)  . ferumoxytol Stopped (05/24/19 1314)  . milrinone 0.125 mcg/kg/min (05/25/19 0700)    PRN Medications: acetaminophen, albuterol, cyclobenzaprine, ondansetron (ZOFRAN) IV, polyethylene glycol    Assessment/Plan   1. Cardiogenic shock with sever biventricular dysfunction - Previous EF 40% in 10/18 - Echo in ER EF 10% with severe RV dysfunction. Moderate to severe AI - cMRI - Findings consistent with acute myocarditis. LVEF 11% RV EF 18%  - initial lactate > 6.7 - initial swan #s w/ low output and elevated filling pressures: CO/CI 1.49/0.84, RA 16, PCWP 26, PAPi 0.74. Co-ox 39% - Suspect PVCs playing a role. Now on amio - CO-OX 53% - CVP improving. Hold diuretics. Creatinine/BUN trending up.  - Continue Bidil 2 tablet tid  - Continue spiro.  - consider low-dose digoxin - Hold off on losartan with elevated creatinine.  - not candidate for mechanical support with PAD and severe AI - Palliative Care discussions ongoing. I think she wil weather the acute storm better than I thought she might but now will have HF on top of her other chronic issues  and not sure how good we can get her QOL. I feel we should continue to be aggressive for now but I feel setting limits with DNR/DNI is probably appropriate as heroic measure would not likely lead to a good outcome in someone this ill. I discussed this with he again today.  - cMRI - Findings consistent with acute myocarditis. LVEF 11% RV normal  2. CAD - s/p CABG with LIMA to LAD and MVR (bioprosthetic) - Trop 407 -> 387->492.  - doubt ACS but cannot exclude ischemia as contributing factor to CM - No chest pain.  - continue asa, Plavix + statin   3. Moderate to severe AI - consider TEE - BCx NGTD  4. Lung cancer s/p resection - stable  5. Severe PAD - s/p left fem-pop and L CEA - bilateral RAS s/p stenting - stable  6. AKI - due to shock - Creatinine peaked at 2. Todays  creatinine trending up 1.7>1.9 . Hold diuretics for now.  - Continue milrinone.   7. AMS - due to shock. Now resolved  8. Hypokalemia/hyponatremia:  - K 3.7  -Sodium down to 127 today. If continue to fall may need tolvaptan.  - Restrict free water.  - Continue spiro  9. PVCs - Frequent PVCs.  - continue amio. Keep K> 4. Mg > 2.0 - no b-blocker with shock   10. Microcytic anemia - Iron stores low. Give feraheme.   Length of Stay: 7  Amy Clegg, NP  05/25/2019, 8:45 AM  Advanced Heart Failure Team Pager [phone] (M-F; [place])  Please contact [facility] for night-coverage after hours (4p -7a ) and weekends on [email or web address]  Patient seen and examined with the above-signed Advanced Practice Provider and/or Housestaff. I personally reviewed laboratory data, imaging studies and relevant notes. I independently examined the patient and formulated the important aspects of the plan. I have edited the note to reflect any of my changes or salient points. I have personally discussed the plan with the patient and/or family.  cMRI results reviewed. She has very severe biventricular dysfunction in setting of acute (likely viral) myocarditis. Volume status improved. Tolerating Bidil. Co-ox remains low on low dose milrinone. Would not stop inotropes yet.   Creatinine rising slowly. Would hold off on diuretics. May need to increase milrinone if creatinine continues to rise. Rhythm has stabilized on amio.  Prognosis very tenuous. Continue PT/OT.  Glori Bickers, MD  9:28 AM

## 2019-05-25 NOTE — Progress Notes (Signed)
Palliative:  HPI: 69 y.o.femalewith past medical history of systolic CHF (EF 00% 8676), CAD s/p stenting 2016, mitral valve replacement 2016, HTN, HLD, COPD, tobacco use, lung adenocarcinoma stage 1 s/p resection 2018, PVD/PADadmitted on2/23/2021with cardiogenic shock and EF decreased to 10%, severe RV dysfunction, severe aortic insufficiency as well with potential acute viral myocarditis or even giant cell myocarditis per heart failure team.Required ICU admission with milrinone and bicarb infusion.Progressed to step down status.     I met today with Leah [name]. No family currently at bedside. She is much more alert and completely oriented today. She tells me about having MRI yesterday and how uncomfortable this was for her - she is glad this is over.   During our conversation Leah [name] explains to me her experiences when her parents died. Her father was on life support and family had to make the choice to remove life support. She talks about how difficult it was to see him and have to go through this. She tells me that her mother was diagnosed with cancer and she did not want to be resuscitated and once her family all came to see her she died peacefully. I asked Leah [name] after all she has experienced what she would want for herself in this scenario. She tells me that she would rather pass like her mother and would not want her children to see her or have to make a decision to take her off of life support.   We discussed that this wish would be considered a DNR and if her heart stops we would not do CPR (chest compressions), shock, or place her on a breathing machine but would rather keep her comfortable. She confirms that this is her wish and although she wants to live as long a possible "my grand-babies still need me" she does not want to suffer when her her time comes. She talks of how none of [place] have any control or when our time comes so there is no need to worry about this. We did  discuss that we would do everything to keep her from getting to this place. She gives me permission to share this conversation with her son, Leah [name]. She tells me that this is difficult for her children but they need to know.   I called and spoke with son, Leah [name]. I explained to him the conversation above and he understands and respects his mother's wishes. He tells me that this decision is easier to accept if it is her wishes. He will continue to speak with his mother about this.   All questions/concerns addressed. Emotional support provided.   Exam: Alert, oriented x 3. Thin, frail. HR regular. Breathing regular, unlabored. Abd flat. Generalized weakness.   Plan: - DNR placed - Continue full aggressive care  35 min  Vinie Sill, NP Palliative Medicine Team Pager [phone] (Please see [email or web address] for schedule) Team Phone [phone]    Greater than 50%  of this time was spent counseling and coordinating care related to the above assessment and plan

## 2019-05-25 NOTE — Progress Notes (Signed)
Family Medicine Teaching Service Daily Progress Note Intern Pager: [phone]  Patient name: [name] Medical record number: [id number] Date of birth: 07-27-1950 Age: 69 y.o. Gender: female  Primary Care Provider: Guadalupe Dawn, MD Consultants: Heart Failure Code Status: Full   Pt Overview and Major Events to Date:  2/23 admitted in cardiogenic shock to ICU 2/28 transferred to floor   Assessment and Plan: Leah [name] a 69 y.o.femalewho presented with altered mental status, found to be in cardiogenic shock 2/2 viral cardiomyopathy. PMH is significant for CAD, HTN, HLD, PVD, HFpEF (now HFrEF EF 10%), CKD, MV replacement, Hx bronchogenic lung cancer, and tobacco use.  Cardiogenic Shock with biventricular dysfunction, secondary to acute viral myocarditis Has been monitored in ICU and treated by Dr. Haroldine Laws.  Transferred to floor on 2/28.  Currently on milrinone gtt 0.125 mcg/kg/min.  For this admission with EF decreased from 40 to 10% with severe RV dysfunction and moderate to severe AI.  Currently on 3 L per [name], no home O2.  Weight down 3.0 kg from yesterday.  Cardiac MRI 3/1 findings consistent with acute myocarditis.  Severe left ventricular dilation and severe diastolic dysfunction with an EF calculated at 11%.  It also showed normal right ventricular size with severe diastolic dysfunction. - f/u HF, appreciate recs - cont bidil TID - cont spiro 12.5 mg QD - cont milrinone per cards - possible cath during week pending Cr if 1.5 or better  CAD S/P CABG with LIMA to LAD and MVR (bioprosthetic).  Trop 382>505>397.  Home medications include Bildil, Lasix, Nitrostat, Metoprolol, ASA, Plavix.  Consider could be contributing to cardiomyopathy. - cont plan per above - cont ASA, Plavix - cont lipitor 80mg , zetia 10 mg - holding BB in setting of cardiogenic shock, on amio instead - holding lasix, now on spiro  Moderate to severe AI Blood cultures no growth at 5  days. - cont cards plan per above  Goals of care Dr. Haroldine Laws reports that he has had conversations with family in regards to palliative care and they continue to want full scope care.  Overall, patient will need aggressive rehab and nutrition assistance.  Lives with her daughter. - PT/OT: recommend SNF, will speak with family regarding plan - nutrition consult - s/p palliative discussions  Lung cancer s/p resection Currently on 3 liters per [name].  No home O2. - stable, cardiac plan per above  Severe PAD  S/p left femoropopliteal and L CEA - bilateral RAS s/p stenting - stable - cont statin and zetia per above  AKI On CKD: Improving Baseline appears to be 1.0-1.3.  2/2 cardiogenic shock.  Cr 2.33>>1.66>1.86.  Improving with treatment of cardiogenic shock. - cont milrinone per above  AMS Initial problem on presentation.  2/2 cardiogenic shock.  Now resolved.  Currently A&Ox3. - cont to monitor for changes in mental status  Hypervolemic hyponatremia 136>>125.  Likely 2/2 HF. - cont spironolactone - monitor fluid status -1500 mL fluid restriction  Frequent PVCs Improving with amiodarone.  Goal to keep K greater than 4 and mag greater than 2.  K 3.7, Mag 1.7. - replete K 40 mEq x1 - replete Mag sulfate 2g x1 - monitor BMP and Mag daily -Continue amiodarone -Avoid beta-blocker secondary to shock  Iron Deficiency Anemia  Iron 20, Sat 8.  Hgb 10.7, MCV 1.9.  Ferritin 120.  On admission Hgb was 14.6.  Last colonoscopy 2018 by Dr. Havery Moros with tubular adenoma and hyperplastic polyp removed.  Also noted diverticulosis.  Due for repeat colonoscopy in March 2021.  Denies any hematochezia or melena. - start ferrous sulfate QOD - outpatient colonoscopy - transfusion threshold 8 given CAD   FEN/GI: Dysphagia 3 Diet PPx: Lovenox  Disposition: pending cont cardiac workup, planning for SNF, but will confirm with family  Subjective:  Patient reports she has been doing well over  the last 24 hours.  She complains of shortness of breath but says that this is at her baseline and if she is able to rest resolved.  Denies any new chest pain, abdominal pain, lower extremity edema.  Objective: Temp:  [97.4 F (36.3 C)-97.8 F (36.6 C)] 97.5 F (36.4 C) (03/02 0751) Pulse Rate:  [92-103] 96 (03/02 0751) Resp:  [15-21] 17 (03/02 0751) BP: (123-140)/(59-73) 123/67 (03/02 0302) SpO2:  [92 %-97 %] 97 % (03/02 0802) Weight:  [63.5 kg] 63.5 kg (03/02 0302)  Physical Exam:  General: 69 y.o. female frail, resting comfortably in bed Cardio: tachycardic, irregular, systolic murmur at RUSB Lungs: Mildly increased work of breathing patient reports it is getting better.  On 3 L per [place], CTAB Skin: warm and dry  Extremities: Trace BLE edema   Laboratory: Recent Labs  Lab 05/23/19 0429 05/24/19 0546 05/25/19 0457  WBC 10.9* 10.4 10.7*  HGB 11.1* 10.5* 10.7*  HCT 32.7* 30.3* 30.2*  PLT 54* 56* 71*   Recent Labs  Lab 05/19/19 0204 05/19/19 0331 05/23/19 0429 05/24/19 0546 05/25/19 0457  NA 139   < > 128* 127* 125*  K 2.8*   < > 4.3 3.7 3.4*  CL 101   < > 92* 90* 88*  CO2 24   < > 24 24 23   BUN 35*   < > 39* 37* 41*  CREATININE 2.17*   < > 1.64* 1.66* 1.86*  CALCIUM 8.4*   < > 8.2* 7.9* 8.3*  PROT 5.2*  --   --   --   --   BILITOT 3.5*  --   --   --   --   ALKPHOS 75  --   --   --   --   ALT 24  --   --   --   --   AST 31  --   --   --   --   GLUCOSE 182*   < > 105* 173* 104*   < > = values in this interval not displayed.     Imaging/Diagnostic Tests: MR CARDIAC MORPHOLOGY W WO CONTRAST  Result Date: 05/25/2019 CLINICAL DATA:  41F presents with cardiogenic shock EXAM: CARDIAC MRI TECHNIQUE: The patient was scanned on a 1.5 Tesla Siemens magnet. A dedicated cardiac coil was used. Functional imaging was done using Fiesta sequences. 2,3, and 4 chamber views were done to assess for RWMA's. Modified Simpson's rule using a short axis stack was used to calculate an  ejection fraction on a dedicated work Conservation officer, nature. The patient received 6 cc of Gadavist. After 10 minutes inversion recovery sequences were used to assess for infiltration and scar tissue. CONTRAST:  6 cc  of Gadavist FINDINGS: Left ventricle: -Severe dilatation -Severe systolic dysfunction -Elevated T2 values, particularly in the basal inferolateral wall (71ms in basal IL) -Elevated ECV (36% apical, 37% mid, 44% basal) -Mid anteroseptal midwall LGE LV EF:  11% (Normal 56-78%) Absolute volumes: LV EDV: 261mL (Normal 52-141 mL) LV ESV: 270mL (Normal 13-51 mL) LV SV: 77mL (Normal 33-97 mL) CO: 2.8L/min (Normal 2.7-6.0 L/min) Indexed volumes: LV EDV: 155mL/sq-m (Normal 41-81  mL/sq-m) LV ESV: 123mL/sq-m (Normal 12-21 mL/sq-m) LV SV: 62mL/sq-m (Normal 26-56 mL/sq-m) CI: 1.5L/min/sq-m (Normal 1.8-3.8 L/min/sq-m) Right ventricle: -Mild dilatation -Severe systolic dysfunction RV EF: 18% (Normal 47-80%) Absolute volumes: RV EDV: 122mL (Normal 58-154 mL) RV ESV: 145mL (Normal 12-68 mL) RV SV: 25mL (Normal 35-98 mL) CO: 2.6L/min (Normal 2.7-6 L/min) Indexed volumes: RV EDV: 39mL/sq-m (Normal 48-87 mL/sq-m) RV ESV: 37mL/sq-m (Normal 11-28 mL/sq-m) RV SV: 74mL/sq-m (Normal 27-57 mL/sq-m) CI: 1.4L/min/sq-m (Normal 1.8-3.8 L/min/sq-m) Left atrium: Mild enlargement Right atrium: Moderate enlargement Mitral valve: S/p bioprosthetic MV replacement Aortic valve: Regurgitation not well visualized due to artifact from bioprosthetic mitral valve Tricuspid valve: Mild regurgitation Pericardium: Normal IMPRESSION: 1. Findings consistent with acute myocarditis, including elevated native T1/ECV, elevated T2, and mid anteroseptal midwall late gadolinium enhancement 2. Severe LV dilatation with severe systolic dysfunction. EF calculated at 11%, though there was artifact due to ectopy and difficulty with breath holds that affects quantification of volumes/EF 3.  Normal RV size with severe systolic dysfunction Electronically  Signed   By: Oswaldo Milian MD   On: 05/25/2019 00:56    Gifford Shave, MD 05/25/2019, 9:47 AM PGY-1, [place] Intern pager: [phone], text pages welcome

## 2019-05-25 NOTE — Progress Notes (Signed)
Nutrition Follow-up  DOCUMENTATION CODES:   Severe malnutrition in context of chronic illness  INTERVENTION:  Continue Ensure TID Continue MVI with minerals Continue to encourage po intake of meals and supplements  Magic cup BID with meals, each supplement provides 290 kcal and 9 grams of protein   NUTRITION DIAGNOSIS:   Severe Malnutrition related to chronic illness as evidenced by severe fat depletion, severe muscle depletion.  Ongoing  GOAL:   Patient will meet greater than or equal to 90% of their needs  Progressing  MONITOR:   PO intake, Supplement acceptance, Labs, Weight trends  REASON FOR ASSESSMENT:   Consult Diet education  ASSESSMENT:  RD working remotely.  69 yo female admitted with hypothermia and profound cardiogenic shock with severe biventricular dysfuction, ECHO in ER with EF 10%, AKI, AMS. PMH includes CAD s/p CABG with LIMA to LAD and MVR, lung cancer s/p resection, severe PAD  Currently on milrinone, CO-OX 53%.  2/23 Swan placed  Due to patient being edentulous, diet downgraded to dysphagia III on 2/24, eating 10-50% of the last 8 documented meals from 2/25 - 2/28. She is consuming Ensure supplements TID. Will order Magic cup with lunch and dinner meals.   Patient evaluated by SLP on 3/1. Per notes, RN reported that patient tolerating D3 solids/thin liquids well. Education provided on importance of oral care, sitting upright for PO intake and taking small bites and sips.   3/2 MR cardiac morphology -acute myocarditis -severe LV dilation with severe systolic dysfunction - LVEF 11% RV EF 18%  Current wt 139.7 lbs  Medications reviewed and include: gabapentin, MVI Drips: amiodarone milrinone  Labs: BG 104, 173, Na 125 (L), K 3.4 (L), BUN 41 (H) trending up, Cr 1.86 trending up, WBC 10.7 (H)  Diet Order:   Diet Order            DIET DYS 3 Room service appropriate? Yes; Fluid consistency: Thin  Diet effective now               EDUCATION NEEDS:   Not appropriate for education at this time  Skin:  Skin Assessment: Reviewed RN Assessment  Last BM:  no documented BM  Height:   Ht Readings from Last 1 Encounters:  05/18/19 6\' 1"  (1.854 m)    Weight:   Wt Readings from Last 1 Encounters:  05/25/19 63.5 kg    Ideal Body Weight:     BMI:  Body mass index is 18.47 kg/m.  Estimated Nutritional Needs:   Kcal:  2992-4268 kcals  Protein:  100-115 g  Fluid:  >/= 2 L   Lajuan Lines, RD, LDN Clinical Nutrition After Hours/Weekend Pager # in [phone]

## 2019-05-26 LAB — COOXEMETRY PANEL
Carboxyhemoglobin: 1.5 % (ref 0.5–1.5)
Methemoglobin: 1.3 % (ref 0.0–1.5)
O2 Saturation: 56.4 %
Total hemoglobin: 10.4 g/dL — ABNORMAL LOW (ref 12.0–16.0)

## 2019-05-26 LAB — CBC
HCT: 29 % — ABNORMAL LOW (ref 36.0–46.0)
Hemoglobin: 10.2 g/dL — ABNORMAL LOW (ref 12.0–15.0)
MCH: 24.8 pg — ABNORMAL LOW (ref 26.0–34.0)
MCHC: 35.2 g/dL (ref 30.0–36.0)
MCV: 70.6 fL — ABNORMAL LOW (ref 80.0–100.0)
Platelets: 98 10*3/uL — ABNORMAL LOW (ref 150–400)
RBC: 4.11 MIL/uL (ref 3.87–5.11)
RDW: 19.7 % — ABNORMAL HIGH (ref 11.5–15.5)
WBC: 11.5 10*3/uL — ABNORMAL HIGH (ref 4.0–10.5)
nRBC: 2.2 % — ABNORMAL HIGH (ref 0.0–0.2)

## 2019-05-26 LAB — BASIC METABOLIC PANEL
Anion gap: 15 (ref 5–15)
BUN: 43 mg/dL — ABNORMAL HIGH (ref 8–23)
CO2: 22 mmol/L (ref 22–32)
Calcium: 8.1 mg/dL — ABNORMAL LOW (ref 8.9–10.3)
Chloride: 88 mmol/L — ABNORMAL LOW (ref 98–111)
Creatinine, Ser: 1.98 mg/dL — ABNORMAL HIGH (ref 0.44–1.00)
GFR calc Af Amer: 30 mL/min — ABNORMAL LOW (ref >60)
GFR calc non Af Amer: 26 mL/min — ABNORMAL LOW (ref >60)
Glucose, Bld: 190 mg/dL — ABNORMAL HIGH (ref 70–99)
Potassium: 3.7 mmol/L (ref 3.5–5.1)
Sodium: 125 mmol/L — ABNORMAL LOW (ref 135–145)

## 2019-05-26 LAB — MAGNESIUM: Magnesium: 2 mg/dL (ref 1.7–2.4)

## 2019-05-26 MED ORDER — FUROSEMIDE 10 MG/ML IJ SOLN
40.0000 mg | Freq: Once | INTRAMUSCULAR | Status: AC
  Administered 2019-05-26: 40 mg via INTRAVENOUS
  Filled 2019-05-26: qty 4

## 2019-05-26 MED ORDER — POTASSIUM CHLORIDE CRYS ER 20 MEQ PO TBCR
40.0000 meq | EXTENDED_RELEASE_TABLET | Freq: Once | ORAL | Status: AC
  Administered 2019-05-26: 40 meq via ORAL
  Filled 2019-05-26: qty 2

## 2019-05-26 MED ORDER — METOLAZONE 2.5 MG PO TABS
2.5000 mg | ORAL_TABLET | Freq: Once | ORAL | Status: AC
  Administered 2019-05-26: 2.5 mg via ORAL
  Filled 2019-05-26: qty 1

## 2019-05-26 MED ORDER — AMIODARONE HCL 200 MG PO TABS
200.0000 mg | ORAL_TABLET | Freq: Two times a day (BID) | ORAL | Status: DC
  Administered 2019-05-26 – 2019-06-03 (×17): 200 mg via ORAL
  Filled 2019-05-26 (×18): qty 1

## 2019-05-26 MED ORDER — POTASSIUM CHLORIDE CRYS ER 20 MEQ PO TBCR
40.0000 meq | EXTENDED_RELEASE_TABLET | Freq: Once | ORAL | Status: AC
  Administered 2019-05-26: 13:00:00 40 meq via ORAL
  Filled 2019-05-26: qty 2

## 2019-05-26 MED ORDER — FUROSEMIDE 10 MG/ML IJ SOLN
80.0000 mg | Freq: Two times a day (BID) | INTRAMUSCULAR | Status: DC
  Administered 2019-05-26 – 2019-05-27 (×2): 80 mg via INTRAVENOUS
  Filled 2019-05-26 (×2): qty 8

## 2019-05-26 NOTE — Progress Notes (Addendum)
Advanced Heart Failure Rounding Note  PCP-Cardiologist: Peter Martinique, MD   Subjective:   CO-OX 56% on milrinone 0.125 mcg+ amio drip.    CMRI-Findings consistent with acute myocarditis. LVEF 11% RVEF 18% (severe biventricular dysfunction)  SOB with exertion. Denies chest pain.   Objective:   Weight Range: 63.9 kg Body mass index is 18.59 kg/m.   Vital Signs:   Temp:  [97.5 F (36.4 C)-98.1 F (36.7 C)] 97.5 F (36.4 C) (03/03 0736) Pulse Rate:  [95-102] 102 (03/03 0738) Resp:  [15-25] 20 (03/03 0736) BP: (116-127)/(60-76) 117/71 (03/03 0738) SpO2:  [97 %-100 %] 97 % (03/03 0736) Weight:  [63.9 kg] 63.9 kg (03/03 0340) Last BM Date: 05/17/19  Weight change: Filed Weights   05/24/19 0600 05/25/19 0302 05/26/19 0340  Weight: 66.5 kg 63.5 kg 63.9 kg    Intake/Output:   Intake/Output Summary (Last 24 hours) at 05/26/2019 1017 Last data filed at 05/26/2019 0400 Gross per 24 hour  Intake 624.62 ml  Output --  Net 624.62 ml      Physical Exam  CVP 12-13  General:  Thin. Frail. No resp difficulty HEENT: normal Neck: supple. JVP 11-12. Carotids 2+ bilat; no bruits. No lymphadenopathy or thryomegaly appreciated. Cor: PMI nondisplaced. Regular rate & rhythm. No rubs, or murmurs. +S3  Abdomen: soft, nontender, nondistended. No hepatosplenomegaly. No bruits or masses. Good bowel sounds. Extremities: no cyanosis, clubbing, rash, R and LLE trace-1+ edema Neuro: alert & orientedx3, cranial nerves grossly intact. moves all 4 extremities w/o difficulty. Affect pleasant   Telemetry   Sinus Tach 100s  Labs    CBC Recent Labs    05/25/19 0457 05/26/19 0502  WBC 10.7* 11.5*  HGB 10.7* 10.2*  HCT 30.2* 29.0*  MCV 71.9* 70.6*  PLT 71* 98*   Basic Metabolic Panel Recent Labs    05/25/19 0457 05/26/19 0502  NA 125* 125*  K 3.4* 3.7  CL 88* 88*  CO2 23 22  GLUCOSE 104* 190*  BUN 41* 43*  CREATININE 1.86* 1.98*  CALCIUM 8.3* 8.1*  MG 2.1 2.0   Liver  Function Tests No results for input(s): AST, ALT, ALKPHOS, BILITOT, PROT, ALBUMIN in the last 72 hours. No results for input(s): LIPASE, AMYLASE in the last 72 hours. Cardiac Enzymes No results for input(s): CKTOTAL, CKMB, CKMBINDEX, TROPONINI in the last 72 hours.  BNP: BNP (last 3 results) Recent Labs    05/18/19 1707 05/21/19 1206  BNP >4,500.0* 1,490.6*    ProBNP (last 3 results) No results for input(s): PROBNP in the last 8760 hours.   D-Dimer No results for input(s): DDIMER in the last 72 hours. Hemoglobin A1C No results for input(s): HGBA1C in the last 72 hours. Fasting Lipid Panel No results for input(s): CHOL, HDL, LDLCALC, TRIG, CHOLHDL, LDLDIRECT in the last 72 hours. Thyroid Function Tests No results for input(s): TSH, T4TOTAL, T3FREE, THYROIDAB in the last 72 hours.  Invalid input(s): FREET3  Other results:   Imaging    No results found.   Medications:     Scheduled Medications: . aspirin  81 mg Oral Daily  . atorvastatin  80 mg Oral Daily  . Chlorhexidine Gluconate Cloth  6 each Topical Daily  . clopidogrel  75 mg Oral Daily  . enoxaparin (LOVENOX) injection  30 mg Subcutaneous Q24H  . ezetimibe  10 mg Oral Daily  . feeding supplement (ENSURE ENLIVE)  237 mL Oral TID BM  . fluticasone  1 puff Inhalation Daily  . gabapentin  200  mg Oral TID  . isosorbide-hydrALAZINE  2 tablet Oral TID  . multivitamin with minerals  1 tablet Oral Daily  . spironolactone  12.5 mg Oral Daily    Infusions: . amiodarone 30 mg/hr (05/26/19 0400)  . ferumoxytol Stopped (05/24/19 1314)  . milrinone 0.125 mcg/kg/min (05/26/19 0400)    PRN Medications: acetaminophen, albuterol, cyclobenzaprine, ondansetron (ZOFRAN) IV, polyethylene glycol    Assessment/Plan   1. Cardiogenic shock with sever biventricular dysfunction - Previous EF 40% in 10/18 - Echo in ER EF 10% with severe RV dysfunction. Moderate to severe AI - cMRI - Findings consistent with acute  myocarditis. LVEF 11% RV EF 18%  - initial lactate > 6.7 - initial swan #s w/ low output and elevated filling pressures: CO/CI 1.49/0.84, RA 16, PCWP 26, PAPi 0.74. Co-ox 39% - Suspect PVCs playing a role. Now on amio - CO-OX 56% on milrinone 0.125 mcg.   - CVP up to 12 today. Give 40 mg IV lasix x1.  - Continue Bidil 2 tablet tid  - Continue spiro.  - consider low-dose digoxin - Hold off on losartan with elevated creatinine.  - not candidate for mechanical support with PAD and severe AI - Palliative Care discussions ongoing. Per Dr Haroldine Laws --- I think she wil weather the acute storm better than I thought she might but now will have HF on top of her other chronic issues and not sure how good we can get her QOL. I feel we should continue to be aggressive for now but I feel setting limits with DNR/DNI is probably appropriate as heroic measure would not likely lead to a good outcome in someone this ill. - cMRI - Findings consistent with acute myocarditis. LVEF 11% RV reduced., Severe Biventricular Failure.    2. CAD - s/p CABG with LIMA to LAD and MVR (bioprosthetic) - Trop 407 -> 387->492.  - doubt ACS but cannot exclude ischemia as contributing factor to CM - No chest pain.  - continue asa, Plavix + statin   3. Moderate to severe AI - consider TEE - BCx NGTD  4. Lung cancer s/p resection - stable  5. Severe PAD - s/p left fem-pop and L CEA - bilateral RAS s/p stenting - stable  6. AKI - due to shock - Creatinine peaked at 2. Todays creatinine trending up 1.7>1.9>2 .  - Hold diuretics for now.  - Continue milrinone.   7. AMS - due to shock. Now resolved  8. Hypokalemia/hyponatremia:  - K 3.7  -Sodium down to 125 today.  - Restrict free water.  - Continue spiro  9. PVCs - PVCs suppressed. Stop IV amio. Switch to amio 200 mg twice a day.   -Keep K> 4. Mg > 2.0 - no b-blocker with shock   10. Microcytic anemia - Iron stores low. Give feraheme.   11. DNR/DNI     PT recommending SNF Medical team input appreciated. Discussed IV lasix and agree switching to po amio may help some with intake.   Length of Stay: [place], NP  05/26/2019, 10:17 AM  Advanced Heart Failure Team Pager [phone] (M-F; 7a - 4p)  Please contact [facility] for night-coverage after hours (4p -7a ) and weekends on [email or web address]  Patient seen and examined with the above-signed Advanced Practice Provider and/or Housestaff. I personally reviewed laboratory data, imaging studies and relevant notes. I independently examined the patient and formulated the important aspects of the plan. I have edited the note to reflect any of my  changes or salient points. I have personally discussed the plan with the patient and/or family.  She is worse today. Co-ox down. Volume overloaded. More SOB. 2+ edema.  Sodium 125  General:  Weak appearing.Frail + sob  HEENT: normal Neck: supple. JVP 10. Carotids 2+ bilat; no bruits. No lymphadenopathy or thryomegaly appreciated. Cor: PMI nondisplaced. Regular tachy + s3 Lungs: clear Abdomen: soft, nontender, nondistended. No hepatosplenomegaly. No bruits or masses. Good bowel sounds. Extremities: no cyanosis, clubbing, rash, 2+ edema Neuro: alert & orientedx3, cranial nerves grossly intact. moves all 4 extremities w/o difficulty. Affect pleasant  She is back in decompensated HF. With severe biventricular dysfunction due to acute viral myocarditis. Increase milrinone to 0.25. Restart IV lasix. Add metolazone. May need tolvaptan. Options extremely limited.   Glori Bickers, MD  12:05 PM

## 2019-05-26 NOTE — Plan of Care (Signed)

## 2019-05-26 NOTE — Progress Notes (Signed)
Physical Therapy Treatment Patient Details Name: Leah [name] MRN: [id number] DOB: 05/09/1950 Today's Date: 05/26/2019    History of Present Illness 69 y.o. 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 Pt brought to the ED for AMS, now with concern for cardiogenic shock.    PT Comments    Pt more fatigued and lethargic today, slow to respond. Pt with decreased ambulation tolerance today due to onset of light headedness. Pt BP 113/52. RN reports pt's medicines being changed as well as receiving Lasix. Pt con't to benefit from acute PT to increase activity tolerance and progress towards indep.    Follow Up Recommendations  SNF;Supervision/Assistance - 24 hour     Equipment Recommendations  Rolling walker with 5" wheels    Recommendations for Other Services       Precautions / Restrictions Precautions Precautions: Fall Restrictions Weight Bearing Restrictions: No    Mobility  Bed Mobility Overal bed mobility: Needs Assistance Bed Mobility: Supine to Sit     Supine to sit: Min assist     General bed mobility comments: HOB elevated, minA for LE management off EOB, increased time  Transfers Overall transfer level: Needs assistance Equipment used: Rolling walker (2 wheeled) Transfers: Sit to/from Stand Sit to Stand: Mod assist         General transfer comment: increased assist today to power up, easier with bed elevated than from chair as pt is 6'1"  Ambulation/Gait Ambulation/Gait assistance: +2 safety/equipment;Min assist Gait Distance (Feet): 40 Feet Assistive device: Rolling walker (2 wheeled) Gait Pattern/deviations: Decreased stride length;Step-through pattern;Trunk flexed Gait velocity: slow Gait velocity interpretation: <1.31 ft/sec, indicative of household ambulator General Gait  Details: verbal cues to hold head up and look foward, v/c's to increase base of support widlth, pt with increased trunk flex. SpO2 >95% on RA. attempted to amb again after seated rest break however pt with lightheadedness, attempted x 2. BP 113/52. RN reports MD to have switched her meds earlier this morning    Stairs             Wheelchair Mobility    Modified Rankin (Stroke Patients Only)       Balance Overall balance assessment: Needs assistance Sitting-balance support: No upper extremity supported;Feet supported Sitting balance-[name]: Fair Sitting balance - Comments: close supervision with UE support anf at least one foot supported   Standing balance support: Bilateral upper extremity supported Standing balance-[name]: Poor Standing balance comment: requires use of RW for safe standing                            Cognition Arousal/Alertness: Awake/alert Behavior During Therapy: Flat affect Overall Cognitive Status: Impaired/Different from baseline Area of Impairment: Problem solving                             Problem Solving: Slow processing        Exercises      General Comments General comments (skin integrity, edema, etc.): VSS, PTs SPo3 >95% on RA      Pertinent Vitals/Pain Pain Assessment: Faces Faces Pain Scale: Hurts little more Pain Location: bilat knees Pain Descriptors / Indicators: Aching Pain Intervention(s): Monitored during session    Home Living  Prior Function            PT Goals (current goals can now be found in the care plan section) Progress towards PT goals: Not progressing toward goals - comment(limited by lighteheadedness today)    Frequency    Min 3X/week      PT Plan Current plan remains appropriate    Co-evaluation              AM-PAC PT "6 Clicks" Mobility   Outcome Measure  Help needed turning from your back to your side while in a flat bed  without using bedrails?: A Little Help needed moving from lying on your back to sitting on the side of a flat bed without using bedrails?: A Little Help needed moving to and from a bed to a chair (including a wheelchair)?: A Little Help needed standing up from a chair using your arms (e.g., wheelchair or bedside chair)?: A Little Help needed to walk in hospital room?: A Little Help needed climbing 3-5 steps with a railing? : A Lot 6 Click Score: 17    End of Session Equipment Utilized During Treatment: Gait belt Activity Tolerance: Patient limited by fatigue Patient left: in chair;with call bell/phone within reach Nurse Communication: Mobility status PT Visit Diagnosis: Unsteadiness on feet (R26.81)     Time: 5051-8335 PT Time Calculation (min) (ACUTE ONLY): 33 min  Charges:  $Gait Training: 8-22 mins $Therapeutic Activity: 8-22 mins                     Kittie Plater, PT, DPT Acute Rehabilitation Services Pager #: [phone] Office #: [phone]    Leah [name] 05/26/2019, 1:30 PM

## 2019-05-26 NOTE — Progress Notes (Signed)
Family Medicine Teaching Service Daily Progress Note Intern Pager: [phone]  Patient name: Leah [name] [facility] Medical record number: [id number] Date of birth: 05/04/1950 Age: 69 y.o. Gender: female  Primary Care Provider: Guadalupe Dawn, MD Consultants: Heart Failure Code Status: Full   Pt Overview and Major Events to Date:  2/23 admitted in cardiogenic shock to ICU 2/28 transferred to floor   Assessment and Plan: Leah [name] a 69 y.o.femalewho presented with altered mental status, found to be in cardiogenic shock 2/2 viral cardiomyopathy. PMH is significant for CAD, HTN, HLD, PVD, HFpEF (now HFrEF EF 10%), CKD, MV replacement, Hx bronchogenic lung cancer, and tobacco use.  Cardiogenic Shock with biventricular dysfunction, secondary to acute viral myocarditis Has been monitored in ICU and treated by Dr. Haroldine Laws.  Transferred to floor on 2/28.  Currently on milrinone gtt 0.125 mcg/kg/min.  For this admission with EF decreased from 40 to 10% with severe RV dysfunction and moderate to severe AI.  Currently on 3 L per [place], no home O2.  Weight down 3.0 kg from yesterday.  Cardiac MRI 3/1 findings consistent with acute myocarditis.  Severe left ventricular dilation and severe diastolic dysfunction with an EF calculated at 11%.  It also showed normal right ventricular size with RVEF of 18%.  Patient has worsening edema of the lower extremities - f/u HF, appreciate recs - cont bidil 2 tablets TID  - cont spiro 12.5 mg QD - cont milrinone per cards -Consider low-dose digoxin  - possible cath during week pending Cr if 1.5 or better -Per heart failure patient is going to get 1 dose of Lasix to help with excess fluids -Elevate legs when possible  -Consider topical medications or lidocaine patch to help with the lower extremity pain -patient has as needed Tylenol for pain  CAD S/P CABG with LIMA to LAD and MVR (bioprosthetic).  Trop 295>188>416.  Home medications include Bildil,  Lasix, Nitrostat, Metoprolol, ASA, Plavix.  Consider could be contributing to cardiomyopathy. - cont plan per above - cont ASA, Plavix - cont lipitor 80mg , zetia 10 mg - holding BB in setting of cardiogenic shock, on amio instead - holding lasix, now on spiro -Patient is going to receive 1 dose of Lasix today due to mild increase in shortness of breath as well as edema in lower extremities  Moderate to severe AI Blood cultures no growth at 5 days. - cont cards plan per above  Goals of care Dr. Haroldine Laws reports that he has had conversations with family in regards to palliative care and they continue to want full scope care.  Overall, patient will need aggressive rehab and nutrition assistance.  Lives with her daughter. - PT/OT: recommend SNF, will speak with family regarding plan - nutrition consult - s/p palliative discussions, patient is now DNR but continue with aggressive care  Lung cancer s/p resection Currently on 3 liters per [place].  No home O2. - stable, cardiac plan per above  Severe PAD  S/p left femoropopliteal and L CEA - bilateral RAS s/p stenting - stable - cont statin and zetia per above  AKI On CKD: Improving Baseline appears to be 1.0-1.3.  2/2 cardiogenic shock.  Cr 2.33>>1.66>1.86.  Improving with treatment of cardiogenic shock. - cont milrinone per above  AMS Initial problem on presentation.  2/2 cardiogenic shock.  Now resolved.  Currently A&Ox3. - cont to monitor for changes in mental status  Hypervolemic hyponatremia 136>>125.  Likely 2/2 HF. - cont spironolactone - monitor fluid status -1500 mL fluid  restriction -1 dose of Lasix today 3/3, if sodium continues to drop consider tolvaptan  Frequent PVCs Improving with amiodarone.  Goal to keep K greater than 4 and mag greater than 2.  K 3.7, Mag 1.7. - replete K 40 mEq x1 - replete Mag sulfate 2g x1 - monitor BMP and Mag daily -Transition from IV amiodarone to p.o. amiodarone -Avoid beta-blocker  secondary to shock  Iron Deficiency Anemia  Iron 20, Sat 8.  Hgb 10.7, MCV 1.9.  Ferritin 120.  On admission Hgb was 14.6.  Last colonoscopy 2018 by Dr. Havery Moros with tubular adenoma and hyperplastic polyp removed.  Also noted diverticulosis.  Due for repeat colonoscopy in March 2021.  Denies any hematochezia or melena. - start ferrous sulfate QOD - outpatient colonoscopy - transfusion threshold 8 given CAD   FEN/GI: Dysphagia 3 Diet PPx: Lovenox  Disposition: pending cont cardiac workup, planning for SNF, but will confirm with family  Subjective:  Patient is doing well this morning although she is complaining of bilateral lower extremity pain.  She reports that it is worse this morning and it has been in the past.  She feels it may be related to the swelling in her legs.  Denies any shortness of breath or chest pain.  Objective: Temp:  [97.5 F (36.4 C)-98.1 F (36.7 C)] 97.6 F (36.4 C) (03/02 2335) Pulse Rate:  [95-99] 95 (03/02 1648) Resp:  [15-25] 20 (03/03 0400) BP: (116-127)/(60-76) 123/60 (03/03 0340) SpO2:  [97 %-100 %] 98 % (03/02 1648) Weight:  [63.9 kg] 63.9 kg (03/03 0340)  Physical Exam:  General: No acute distress, sitting with legs on the side of her bed Cardio: tachycardic, irregular, systolic murmur at RUSB Lungs: Lungs are clear to auscultation, patient is on room air when I get into the room Skin: warm and dry  Extremities: +2 bilateral lower extremity edema.  Tender to palpation along length of bilateral lower extremities where edema is present.  Edema is to the knees.   Laboratory: Recent Labs  Lab 05/24/19 0546 05/25/19 0457 05/26/19 0502  WBC 10.4 10.7* 11.5*  HGB 10.5* 10.7* 10.2*  HCT 30.3* 30.2* 29.0*  PLT 56* 71* 98*   Recent Labs  Lab 05/23/19 0429 05/24/19 0546 05/25/19 0457  NA 128* 127* 125*  K 4.3 3.7 3.4*  CL 92* 90* 88*  CO2 24 24 23   BUN 39* 37* 41*  CREATININE 1.64* 1.66* 1.86*  CALCIUM 8.2* 7.9* 8.3*  GLUCOSE 105* 173*  104*     Imaging/Diagnostic Tests: No results found.  Gifford Shave, MD 05/26/2019, 5:35 AM PGY-1, [facility] Intern pager: [phone], text pages welcome

## 2019-05-26 NOTE — Plan of Care (Signed)
  Problem: Education: Goal: Knowledge of General Education information will improve Description: Including pain rating scale, medication(s)/side effects and non-pharmacologic comfort measures 05/26/2019 0751 by Shanon Ace, RN Outcome: Progressing 05/25/2019 1850 by Shanon Ace, RN Outcome: Progressing   Problem: Health Behavior/Discharge Planning: Goal: Ability to manage health-related needs will improve 05/26/2019 0751 by Shanon Ace, RN Outcome: Progressing 05/25/2019 1850 by Shanon Ace, RN Outcome: Progressing   Problem: Clinical Measurements: Goal: Ability to maintain clinical measurements within normal limits will improve 05/26/2019 0751 by Shanon Ace, RN Outcome: Progressing 05/25/2019 1850 by Shanon Ace, RN Outcome: Progressing Goal: Will remain free from infection 05/26/2019 0751 by Shanon Ace, RN Outcome: Progressing 05/25/2019 1850 by Shanon Ace, RN Outcome: Progressing Goal: Diagnostic test results will improve 05/26/2019 0751 by Shanon Ace, RN Outcome: Progressing 05/25/2019 1850 by Shanon Ace, RN Outcome: Progressing Goal: Respiratory complications will improve 05/26/2019 0751 by Shanon Ace, RN Outcome: Progressing 05/25/2019 1850 by Shanon Ace, RN Outcome: Progressing Goal: Cardiovascular complication will be avoided 05/26/2019 0751 by Shanon Ace, RN Outcome: Progressing 05/25/2019 1850 by Shanon Ace, RN Outcome: Progressing   Problem: Activity: Goal: Risk for activity intolerance will decrease 05/26/2019 0751 by Shanon Ace, RN Outcome: Progressing 05/25/2019 1850 by Shanon Ace, RN Outcome: Progressing   Problem: Nutrition: Goal: Adequate nutrition will be maintained 05/26/2019 0751 by Shanon Ace, RN Outcome: Progressing 05/25/2019 1850 by Shanon Ace, RN Outcome: Progressing   Problem: Coping: Goal: Level of anxiety will decrease 05/26/2019 0751 by Shanon Ace, RN Outcome: Progressing 05/25/2019 1850 by Shanon Ace,  RN Outcome: Progressing   Problem: Elimination: Goal: Will not experience complications related to bowel motility 05/26/2019 0751 by Shanon Ace, RN Outcome: Progressing 05/25/2019 1850 by Shanon Ace, RN Outcome: Progressing Goal: Will not experience complications related to urinary retention 05/26/2019 0751 by Shanon Ace, RN Outcome: Progressing 05/25/2019 1850 by Shanon Ace, RN Outcome: Progressing   Problem: Pain Managment: Goal: General experience of comfort will improve 05/26/2019 0751 by Shanon Ace, RN Outcome: Progressing 05/25/2019 1850 by Shanon Ace, RN Outcome: Progressing   Problem: Safety: Goal: Ability to remain free from injury will improve 05/26/2019 0751 by Shanon Ace, RN Outcome: Progressing 05/25/2019 1850 by Shanon Ace, RN Outcome: Progressing   Problem: Skin Integrity: Goal: Risk for impaired skin integrity will decrease 05/26/2019 0751 by Shanon Ace, RN Outcome: Progressing 05/25/2019 1850 by Shanon Ace, RN Outcome: Progressing

## 2019-05-27 DIAGNOSIS — Z952 Presence of prosthetic heart valve: Secondary | ICD-10-CM

## 2019-05-27 DIAGNOSIS — I10 Essential (primary) hypertension: Secondary | ICD-10-CM

## 2019-05-27 DIAGNOSIS — I5082 Biventricular heart failure: Secondary | ICD-10-CM

## 2019-05-27 LAB — COOXEMETRY PANEL
Carboxyhemoglobin: 1.6 % — ABNORMAL HIGH (ref 0.5–1.5)
Methemoglobin: 1.2 % (ref 0.0–1.5)
O2 Saturation: 55 %
Total hemoglobin: 11 g/dL — ABNORMAL LOW (ref 12.0–16.0)

## 2019-05-27 LAB — CBC
HCT: 29.9 % — ABNORMAL LOW (ref 36.0–46.0)
Hemoglobin: 10.8 g/dL — ABNORMAL LOW (ref 12.0–15.0)
MCH: 25.4 pg — ABNORMAL LOW (ref 26.0–34.0)
MCHC: 36.1 g/dL — ABNORMAL HIGH (ref 30.0–36.0)
MCV: 70.2 fL — ABNORMAL LOW (ref 80.0–100.0)
Platelets: 143 10*3/uL — ABNORMAL LOW (ref 150–400)
RBC: 4.26 MIL/uL (ref 3.87–5.11)
RDW: 19.8 % — ABNORMAL HIGH (ref 11.5–15.5)
WBC: 14.4 10*3/uL — ABNORMAL HIGH (ref 4.0–10.5)
nRBC: 1 % — ABNORMAL HIGH (ref 0.0–0.2)

## 2019-05-27 LAB — BASIC METABOLIC PANEL
Anion gap: 13 (ref 5–15)
BUN: 47 mg/dL — ABNORMAL HIGH (ref 8–23)
CO2: 23 mmol/L (ref 22–32)
Calcium: 8.3 mg/dL — ABNORMAL LOW (ref 8.9–10.3)
Chloride: 92 mmol/L — ABNORMAL LOW (ref 98–111)
Creatinine, Ser: 2.33 mg/dL — ABNORMAL HIGH (ref 0.44–1.00)
GFR calc Af Amer: 24 mL/min — ABNORMAL LOW (ref >60)
GFR calc non Af Amer: 21 mL/min — ABNORMAL LOW (ref >60)
Glucose, Bld: 92 mg/dL (ref 70–99)
Potassium: 3.6 mmol/L (ref 3.5–5.1)
Sodium: 128 mmol/L — ABNORMAL LOW (ref 135–145)

## 2019-05-27 LAB — LACTIC ACID, PLASMA
Lactic Acid, Venous: 2 mmol/L (ref 0.5–1.9)
Lactic Acid, Venous: 1.2 mmol/L (ref 0.5–1.9)

## 2019-05-27 LAB — MAGNESIUM: Magnesium: 1.9 mg/dL (ref 1.7–2.4)

## 2019-05-27 MED ORDER — POTASSIUM CHLORIDE CRYS ER 20 MEQ PO TBCR
40.0000 meq | EXTENDED_RELEASE_TABLET | Freq: Once | ORAL | Status: AC
  Administered 2019-05-27: 13:00:00 40 meq via ORAL
  Filled 2019-05-27: qty 2

## 2019-05-27 MED ORDER — MAGNESIUM SULFATE 2 GM/50ML IV SOLN
2.0000 g | Freq: Once | INTRAVENOUS | Status: AC
  Administered 2019-05-27: 2 g via INTRAVENOUS
  Filled 2019-05-27: qty 50

## 2019-05-27 MED ORDER — SENNA 8.6 MG PO TABS
1.0000 | ORAL_TABLET | Freq: Every day | ORAL | Status: DC
  Administered 2019-05-27: 8.6 mg via ORAL
  Filled 2019-05-27: qty 1

## 2019-05-27 MED ORDER — MAGNESIUM SULFATE 2 GM/50ML IV SOLN
2.0000 g | Freq: Once | INTRAVENOUS | Status: DC
  Filled 2019-05-27: qty 50

## 2019-05-27 MED ORDER — SORBITOL 70 % SOLN: 30.0000 mL | Freq: Every day | Status: DC | PRN

## 2019-05-27 NOTE — Progress Notes (Signed)
Occupational Therapy Treatment Patient Details Name: Leah [name] MRN: [id number] DOB: [birth year] Today's Date: 05/27/2019    History of present illness 69 y.o. 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 Pt brought to the ED for AMS, now with concern for cardiogenic shock.   OT comments  Patient supine in bed and agreeable to OT. Patient requires min assist for bed mobility, mod assist for transfers using RW and setup assist for grooming.  She reports dizziness with positional changes, with mild decrease in BP once sitting but stabilized with prolonged sitting and minimal exercises/grooming. HR ranged from 110s-130s.  Remains limited by weakness, balance and tolerance. Will follow acutely.    Follow Up Recommendations  SNF;Supervision/Assistance - 24 hour    Equipment Recommendations  None recommended by OT    Recommendations for Other Services      Precautions / Restrictions Precautions Precautions: Fall Precaution Comments: tachy Restrictions Weight Bearing Restrictions: No       Mobility Bed Mobility Overal bed mobility: Needs Assistance Bed Mobility: Supine to Sit     Supine to sit: Min assist     General bed mobility comments: min assist given increased time for trunk support  Transfers Overall transfer level: Needs assistance Equipment used: Rolling walker (2 wheeled) Transfers: Sit to/from [facility] Sit to Stand: Mod assist Stand pivot transfers: Min assist       General transfer comment: mod assist to power up and steady from elevated EOB, cueing for hand placement and technique; pivoting to recliner with min assist to steady     Balance Overall balance assessment: Needs assistance Sitting-balance support: No upper extremity supported;Feet supported Sitting  balance-[name]: Fair Sitting balance - Comments: min guard to close supervision for safety    Standing balance support: Bilateral upper extremity supported;During functional activity Standing balance-[name]: Poor Standing balance comment: relaint on BUE and external support                           ADL either performed or assessed with clinical judgement   ADL Overall ADL's : Needs assistance/impaired     Grooming: Set up;Sitting;Wash/dry face;Oral care                   Toilet Transfer: Moderate assistance;Stand-pivot;RW Toilet Transfer Details (indicate cue type and reason): simulated to recliner          Functional mobility during ADLs: Moderate assistance;Minimal assistance;Cueing for safety;Rolling walker       Vision       Perception     Praxis      Cognition Arousal/Alertness: Awake/alert Behavior During Therapy: Flat affect Overall Cognitive Status: Impaired/Different from baseline Area of Impairment: Problem solving                             Problem Solving: Slow processing;Requires verbal cues          Exercises     Shoulder Instructions       General Comments SpO2 on RA, HR ranged from 110s-130 max; dizziness upon sitting EOB, mild decrease in BP initally and BP stable after increased time sitting      Pertinent Vitals/ Pain       Pain Assessment:  No/denies pain Faces Pain Scale: No hurt  Home Living                                          Prior Functioning/Environment              Frequency  Min 2X/week        Progress Toward Goals  OT Goals(current goals can now be found in the care plan section)  Progress towards OT goals: Progressing toward goals  Acute Rehab OT Goals Patient Stated Goal: To return to independent PLOF OT Goal Formulation: With patient  Plan Discharge plan remains appropriate;Frequency remains appropriate    Co-evaluation                  AM-PAC OT "6 Clicks" Daily Activity     Outcome Measure   Help from another person eating meals?: None Help from another person taking care of personal grooming?: A Little Help from another person toileting, which includes using toliet, bedpan, or urinal?: A Little Help from another person bathing (including washing, rinsing, drying)?: A Little Help from another person to put on and taking off regular upper body clothing?: A Little Help from another person to put on and taking off regular lower body clothing?: A Little 6 Click Score: 19    End of Session Equipment Utilized During Treatment: Rolling walker  OT Visit Diagnosis: Unsteadiness on feet (R26.81);Other abnormalities of gait and mobility (R26.89);Muscle weakness (generalized) (M62.81)   Activity Tolerance Patient tolerated treatment well   Patient Left in chair;with call bell/phone within reach   Nurse Communication Mobility status        Time: 5852-7782 OT Time Calculation (min): 26 min  Charges: OT General Charges $OT Visit: 1 Visit OT Treatments $Self Care/Home Management : 23-37 mins  [place] Pager [phone] Office [phone]     Delight Stare 05/27/2019, 2:30 PM

## 2019-05-27 NOTE — Progress Notes (Signed)
Family Medicine Teaching Service Daily Progress Note Intern Pager: [phone]  Patient name: Leah [name] [facility] Medical record number: [id number] Date of birth: 19-Aug-1950 Age: 69 y.o. Gender: female  Primary Care Provider: Guadalupe Dawn, MD Consultants: Heart Failure Code Status: Full   Pt Overview and Major Events to Date:  2/23 admitted in cardiogenic shock to ICU 2/28 transferred to floor   Assessment and Plan: [name] a 69 y.o.femalewho presented with altered mental status, found to be in cardiogenic shock 2/2 viral cardiomyopathy. PMH is significant for CAD, HTN, HLD, PVD, HFpEF (now HFrEF EF 10%), CKD, MV replacement, Hx bronchogenic lung cancer, and tobacco use.  Cardiogenic Shock with biventricular dysfunction, secondary to acute viral myocarditis Has been monitored in ICU and treated by Dr. Haroldine Laws.  Transferred to floor on 2/28.  Currently on milrinone gtt 0.125 mcg/kg/min.  For this admission with EF decreased from 40 to 10% with severe RV dysfunction and moderate to severe AI.  Currently on 3 L per [place], no home O2.  Weight down 3.0 kg from yesterday.  Cardiac MRI 3/1 findings consistent with acute myocarditis.  Severe left ventricular dilation and severe diastolic dysfunction with an EF calculated at 11%.  It also showed normal right ventricular size with RVEF of 18%.  Patient has worsening edema of the lower extremities - f/u HF, appreciate recs - cont bidil 2 tablets TID  - cont spiro 12.5 mg QD - cont milrinone per cards increased to 0.25 yesterday  -Consider low-dose digoxin but not at this time due to elevated creatinine - possible cath during week pending Cr if 1.5 or better -IV Lasix yesterday but holding at this time due to creatinine trending up -Elevate legs when possible  -Consider topical medications or lidocaine patch to help with the lower extremity pain -patient has as needed Tylenol for pain  CAD S/P CABG with LIMA to LAD and MVR  (bioprosthetic).  Trop 419>622>297.  Home medications include Bildil, Lasix, Nitrostat, Metoprolol, ASA, Plavix.  Consider could be contributing to cardiomyopathy. - cont plan per above - cont ASA, Plavix - cont lipitor 80mg , zetia 10 mg - holding BB in setting of cardiogenic shock, on amio instead - holding lasix, now on spiro -Patient is going to receive 1 dose of Lasix today due to mild increase in shortness of breath as well as edema in lower extremities  Moderate to severe AI Blood cultures no growth at 5 days. - cont cards plan per above  Goals of care Dr. Haroldine Laws reports that he has had conversations with family in regards to palliative care and they continue to want full scope care.  Overall, patient will need aggressive rehab and nutrition assistance.  Lives with her daughter. - PT/OT: recommend SNF, will speak with family regarding plan - nutrition consult - s/p palliative discussions, patient is now DNR but continue with aggressive care  Lung cancer s/p resection Currently on 3 liters per [place].  No home O2. - stable, cardiac plan per above  Severe PAD  S/p left femoropopliteal and L CEA - bilateral RAS s/p stenting - stable - cont statin and zetia per above  AKI On CKD: Improving Baseline appears to be 1.0-1.3.  2/2 cardiogenic shock.  Cr 2.33>>1.66>1.86>2.33.  Improving with treatment of cardiogenic shock. - cont milrinone per above  AMS Initial problem on presentation.  2/2 cardiogenic shock.  Now resolved.  Currently A&Ox3. - cont to monitor for changes in mental status  Hypervolemic hyponatremia 136>>125>128.  Likely 2/2 HF. -  cont spironolactone - monitor fluid status -1500 mL fluid restriction -If sodium continues to drop consider tolvaptan  Frequent PVCs Improving with amiodarone.  Goal to keep K greater than 4 and mag greater than 2.  K 3.6, Mag 1.7. - replete K 40 mEq x1 - replete Mag sulfate 2g x1 - monitor BMP and Mag daily -Continue p.o.  amiodarone 200 mg twice daily -Avoid beta-blocker secondary to shock  Iron Deficiency Anemia  Iron 20, Sat 8.  Hgb 10.7, MCV 1.9.  Ferritin 120.  On admission Hgb was 14.6.  Last colonoscopy 2018 by Dr. Havery Moros with tubular adenoma and hyperplastic polyp removed.  Also noted diverticulosis.  Due for repeat colonoscopy in March 2021.  Denies any hematochezia or melena. - start ferrous sulfate QOD - outpatient colonoscopy - transfusion threshold 8 given CAD   FEN/GI: Dysphagia 3 Diet PPx: Lovenox  Disposition:  planning for SNF when stable from cardiac standpoint  Subjective:  Patient reports that her bilateral lower extremity pain has gotten better today.  The swelling is also decreased.  She is also less short of breath compared to yesterday.  Patient has no complaints or concerns at this time Objective: Temp:  [97.3 F (36.3 C)-98 F (36.7 C)] 97.3 F (36.3 C) (03/04 0348) Pulse Rate:  [93-107] 106 (03/04 0348) Resp:  [16-20] 17 (03/03 2338) BP: (113-120)/(60-71) 120/60 (03/04 0348) SpO2:  [93 %-97 %] 94 % (03/04 0348) Weight:  [63.6 kg] 63.6 kg (03/04 0348)  Physical Exam:  General: No acute distress, sitting up in bed.  Says that she is thirsty Cardio: tachycardic in the 110s, irregular, systolic murmur at RUSB Lungs: Lungs are clear to auscultation, patient has normal work of breathing at this time Skin: warm and dry  Extremities: +1 bilateral lower extremity edema.  Edema is to mid shin level.   Laboratory: Recent Labs  Lab 05/25/19 0457 05/26/19 0502 05/27/19 0500  WBC 10.7* 11.5* 14.4*  HGB 10.7* 10.2* 10.8*  HCT 30.2* 29.0* 29.9*  PLT 71* 98* 143*   Recent Labs  Lab 05/24/19 0546 05/25/19 0457 05/26/19 0502  NA 127* 125* 125*  K 3.7 3.4* 3.7  CL 90* 88* 88*  CO2 24 23 22   BUN 37* 41* 43*  CREATININE 1.66* 1.86* 1.98*  CALCIUM 7.9* 8.3* 8.1*  GLUCOSE 173* 104* 190*     Imaging/Diagnostic Tests: No results found.  Gifford Shave,  MD 05/27/2019, 6:01 AM PGY-1, [employer] Intern pager: [phone], text pages welcome

## 2019-05-27 NOTE — Progress Notes (Signed)
CRITICAL VALUE ALERT  Critical Value:  Lactic Acid 2.0   Date & Time Notied:  05/27/2019 at 11:10  Provider Notified: Amy, NP with [facility]   Orders Received/Actions taken: Awaiting orders, (previous orders for 2nd draw at 1245).

## 2019-05-27 NOTE — Progress Notes (Signed)
Palliative:  HPI:69 y.o.femalewith past medical history of systolic CHF (EF 73% 0856), CAD s/p stenting 2016, mitral valve replacement 2016, HTN, HLD, COPD, tobacco use, lung adenocarcinoma stage 1 s/p resection 2018, PVD/PADadmitted on2/23/2021with cardiogenic shock and EF decreased to 10%, severe RV dysfunction, severe aortic insufficiency as well with likely acute viral myocarditis heart failure team.Required ICU admission with milrinone and bicarb infusion.Progressed to step down status.   I met today with Ms. [place]. She is sitting up in recliner. Still awake and alert and feeling overall well. However, her abdomen is very distended and uncomfortable - RN notes that she has not had BM in many days. Her other complaint is discomfort in her feet/ankles likely secondary to swelling. Other than these complaints she continues to feel better and would like to continue on with aggressive care. No changes in plan of care.   All questions/concerns addressed. Emotional support provided.   Exam: Alert, oriented. HR regular rate. Breathing regular, unlabored. Abd distended, full, non-tender. Generalized weakness. BLE edema in feet/ankles.   Plan: - DNR established.  - Continue full aggressive care.  - Constipation: Senokot 1 tablet daily. Sorbitol daily prn.   [place], NP Palliative Medicine Team Pager [phone] (Please see [email or web address] for schedule) Team Phone [phone]    Greater than 50%  of this time was spent counseling and coordinating care related to the above assessment and plan

## 2019-05-27 NOTE — TOC Initial Note (Addendum)
Transition of Care [facility]) - Initial/Assessment Note    Patient Details  Name: Leah [name] MRN: [id number] Date of Birth: 1950/07/07  Transition of Care [facility]) CM/SW Contact:    Alberteen Sam, [place] Phone Number: [phone] 05/27/2019, 2:16 PM  Clinical Narrative:                  CSW spoke with [name] as he reports he is point of contact for discharge planning. [name] states family has decided for patient to go to [facility] and requests referral be sent to them.   CSW has faxed referral to [facility] at this time, pending bed offer. Will need updated covid test prior to dc.   CSW confirmed with facility they do not accept patients with Milrinone, patient will not be able to dc to SNF if she will need milrinone at time of discharge.   CSW also reached out to [facility] and [facility], neither taking patients with Milrinone.   Expected Discharge Plan: Skilled Nursing Facility Barriers to Discharge: Continued Medical Work up   Patient Goals and CMS Choice   CMS Medicare.gov Compare Post Acute Care list provided to:: Patient Represenative (must comment)(son [name]) Choice offered to / list presented to : Adult Children  Expected Discharge Plan and Services Expected Discharge Plan: [place] In-house Referral: NA Discharge Planning Services: CM Consult Post Acute Care Choice: [place] Living arrangements for the past 2 months: Single Family Home                           HH Arranged: RN, Disease Management, PT [place] Agency: [place] ([facility]) Date HH Agency Contacted: 05/21/19 Time HH Agency Contacted: 1502 Representative spoke with at [place]: [place] Arrangements/Services Living arrangements for the past 2 months: [place] with:: Relatives Patient language and need for interpreter reviewed:: Yes Do you feel safe going back to the place where you live?: Yes      Need  for Family Participation in Patient Care: Yes (Comment) Care giver support system in place?: Yes (comment)   Criminal Activity/Legal Involvement Pertinent to Current Situation/Hospitalization: No - Comment as needed  Activities of Daily Living Home Assistive Devices/Equipment: None ADL Screening (condition at time of admission) Patient's cognitive ability adequate to safely complete daily activities?: No Is the patient deaf or have difficulty hearing?: No Does the patient have difficulty seeing, even when wearing glasses/contacts?: No Does the patient have difficulty concentrating, remembering, or making decisions?: Yes Patient able to express need for assistance with ADLs?: Yes Does the patient have difficulty dressing or bathing?: Yes Independently performs ADLs?: No Communication: Appropriate for developmental age Dressing (OT): Needs assistance Is this a change from baseline?: Pre-admission baseline Grooming: Needs assistance Is this a change from baseline?: Pre-admission baseline Feeding: Needs assistance Is this a change from baseline?: Pre-admission baseline Bathing: Needs assistance Is this a change from baseline?: Pre-admission baseline Toileting: Needs assistance Is this a change from baseline?: Pre-admission baseline In/Out Bed: Needs assistance Is this a change from baseline?: Pre-admission baseline Walks in Home: Needs assistance Is this a change from baseline?: Pre-admission baseline Does the patient have difficulty walking or climbing stairs?: Yes Weakness of Legs: Both Weakness of Arms/Hands: Both  Permission Sought/Granted Permission sought to share information with : Case Manager, Customer service manager, Family Supports Permission granted to share information with : Yes, Verbal Permission Granted  Share Information with  NAME: [name]  Permission granted to share info w AGENCY: SNFs  Permission granted to share info w Relationship: son  Permission  granted to share info w Contact Information: [phone]  Emotional Assessment Appearance:: Appears stated age Attitude/Demeanor/Rapport: Unable to Assess Affect (typically observed): Unable to Assess Orientation: : Oriented to Place, Oriented to  Time, Oriented to Situation, Oriented to Self Alcohol / Substance Use: Not Applicable Psych Involvement: No (comment)  Admission diagnosis:  Cardiogenic shock ([place]) [R57.0] NSTEMI (non-ST elevated myocardial infarction) ([place]) [I21.4] Elevated brain natriuretic peptide (BNP) level [R79.89] AKI (acute kidney injury) ([place]) [N17.9] Altered mental status, unspecified altered mental status type [R41.82] AMS (altered mental status) [R41.82] Patient Active Problem List   Diagnosis Date Noted  . NSTEMI (non-ST elevated myocardial infarction) ([place])   . Palliative care by specialist   . Protein-calorie malnutrition, severe 05/19/2019  . AMS (altered mental status) 05/18/2019  . Cardiogenic shock ([place]) 05/18/2019  . AKI (acute kidney injury) ([place])   . Shortness of breath 04/15/2019  . Thoracic aortic aneurysm without rupture ([place]) 09/03/2018  . Viral upper respiratory illness 03/16/2018  . Hypokalemia 02/12/2018  . Claudication ([place]) 11/21/2017  . Seasonal allergies 07/28/2017  . Bilateral sciatica 07/02/2017  . Bilateral hearing loss due to cerumen impaction 07/02/2017  . CHF (congestive heart failure) ([place]) 06/05/2017  . Bronchogenic lung cancer, right ([place]) 11/29/2016  . Dysphagia 08/26/2016  . Health care maintenance 04/17/2016  . Dizziness 04/17/2016  . Decreased visual acuity 01/17/2016  . Depressed mood 07/27/2015  . S/P MVR (mitral valve replacement) 12/05/2014  . Moderate aortic regurgitation 10/26/2014  . Renal vascular disease 10/26/2014  . Acute on chronic diastolic congestive heart failure ([place]) 10/21/2014  . Tobacco use 10/21/2014  . Diastolic dysfunction, grade 2 by echo June 2016 10/21/2014  . Carpal tunnel syndrome  10/17/2014  . Severe mitral regurgitation 08/25/2014  . Unstable angina ([place]) 08/22/2014  . Low back pain 07/05/2012  . PVC (premature ventricular contraction) 10/02/2011  . History of angioedema with ACE 2013 06/14/2011  . PVD- s/p multiple proceedures 06/04/2011  . Hyperlipidemia 09/28/2008  . Essential hypertension, benign 09/28/2008  . CAD S/P LAD DES 2009 with 70% ISR 08/24/14 09/28/2008   PCP:  Guadalupe Dawn, MD Pharmacy:   [facility] [address] [place] [address] Phone: [phone] Fax: [phone]  [place], [place] - [address] [place] [address] Phone: [phone] Fax: [phone]  [place], [place] - [address] [place] [address] Phone: [phone] Fax: [phone]     Social Determinants of Health (SDOH) Interventions    Readmission Risk Interventions Readmission Risk Prevention Plan 05/21/2019  Transportation Screening Complete  PCP or Specialist Appt within 3-5 Days Complete  HRI or [place] Complete  Social Work Consult for [place] Planning/Counseling Complete  Palliative Care Screening Not Applicable  Medication Review Press photographer) Complete  Some recent data might be hidden

## 2019-05-27 NOTE — Plan of Care (Signed)

## 2019-05-27 NOTE — Progress Notes (Addendum)
Advanced Heart Failure Rounding Note  PCP-Cardiologist: Peter Martinique, MD   Subjective:   CO-OX 55% on milrinone 0.25 mcg   CMRI-Findings consistent with acute myocarditis. LVEF 11% RVEF 18% (severe biventricular dysfunction)  Yesterday milrinone was increased and she was given IV lasix + metolazone.   Feeling better today. Denies SOB.   .   Objective:   Weight Range: 63.6 kg Body mass index is 18.5 kg/m.   Vital Signs:   Temp:  [97.2 F (36.2 C)-98 F (36.7 C)] 97.2 F (36.2 C) (03/04 1100) Pulse Rate:  [92-114] 92 (03/04 1100) Resp:  [15-20] 20 (03/04 1100) BP: (117-129)/(60-93) 129/62 (03/04 1100) SpO2:  [94 %-97 %] 97 % (03/04 1100) Weight:  [63.6 kg] 63.6 kg (03/04 0348) Last BM Date: 05/17/19  Weight change: Filed Weights   05/25/19 0302 05/26/19 0340 05/27/19 0348  Weight: 63.5 kg 63.9 kg 63.6 kg    Intake/Output:   Intake/Output Summary (Last 24 hours) at 05/27/2019 1337 Last data filed at 05/27/2019 0900 Gross per 24 hour  Intake 1013.58 ml  Output 1600 ml  Net -586.42 ml      Physical Exam  CVP 6  General:  Thin frail. No resp difficulty HEENT: normal Neck: supple. no JVD. Carotids 2+ bilat; no bruits. No lymphadenopathy or thryomegaly appreciated. Cor: PMI nondisplaced. Regular rate & rhythm. No rubs or murmurs. + S3  Lungs: clear Abdomen: soft, nontender, nondistended. No hepatosplenomegaly. No bruits or masses. Good bowel sounds. Extremities: no cyanosis, clubbing, rash, edema Neuro: alert & orientedx3, cranial nerves grossly intact. moves all 4 extremities w/o difficulty. Affect pleasant   Telemetry   Sinus Tach 100s   Labs    CBC Recent Labs    05/26/19 0502 05/27/19 0500  WBC 11.5* 14.4*  HGB 10.2* 10.8*  HCT 29.0* 29.9*  MCV 70.6* 70.2*  PLT 98* 277*   Basic Metabolic Panel Recent Labs    05/26/19 0502 05/27/19 0500  NA 125* 128*  K 3.7 3.6  CL 88* 92*  CO2 22 23  GLUCOSE 190* 92  BUN 43* 47*  CREATININE 1.98*  2.33*  CALCIUM 8.1* 8.3*  MG 2.0 1.9   Liver Function Tests No results for input(s): AST, ALT, ALKPHOS, BILITOT, PROT, ALBUMIN in the last 72 hours. No results for input(s): LIPASE, AMYLASE in the last 72 hours. Cardiac Enzymes No results for input(s): CKTOTAL, CKMB, CKMBINDEX, TROPONINI in the last 72 hours.  BNP: BNP (last 3 results) Recent Labs    05/18/19 1707 05/21/19 1206  BNP >4,500.0* 1,490.6*    ProBNP (last 3 results) No results for input(s): PROBNP in the last 8760 hours.   D-Dimer No results for input(s): DDIMER in the last 72 hours. Hemoglobin A1C No results for input(s): HGBA1C in the last 72 hours. Fasting Lipid Panel No results for input(s): CHOL, HDL, LDLCALC, TRIG, CHOLHDL, LDLDIRECT in the last 72 hours. Thyroid Function Tests No results for input(s): TSH, T4TOTAL, T3FREE, THYROIDAB in the last 72 hours.  Invalid input(s): FREET3  Other results:   Imaging    No results found.   Medications:     Scheduled Medications: . amiodarone  200 mg Oral BID  . aspirin  81 mg Oral Daily  . atorvastatin  80 mg Oral Daily  . Chlorhexidine Gluconate Cloth  6 each Topical Daily  . clopidogrel  75 mg Oral Daily  . enoxaparin (LOVENOX) injection  30 mg Subcutaneous Q24H  . ezetimibe  10 mg Oral Daily  . feeding  supplement (ENSURE ENLIVE)  237 mL Oral TID BM  . fluticasone  1 puff Inhalation Daily  . gabapentin  200 mg Oral TID  . isosorbide-hydrALAZINE  2 tablet Oral TID  . multivitamin with minerals  1 tablet Oral Daily  . senna  1 tablet Oral Daily  . spironolactone  12.5 mg Oral Daily    Infusions: . ferumoxytol Stopped (05/24/19 1314)  . magnesium sulfate bolus IVPB 2 g (05/27/19 1310)  . milrinone 0.25 mcg/kg/min (05/27/19 0900)    PRN Medications: acetaminophen, albuterol, cyclobenzaprine, ondansetron (ZOFRAN) IV, polyethylene glycol    Assessment/Plan   1. Cardiogenic shock with sever biventricular dysfunction - Previous EF 40% in  10/18 - Echo in ER EF 10% with severe RV dysfunction. Moderate to severe AI - cMRI - Findings consistent with acute myocarditis. LVEF 11% RV EF 18%  - initial lactate > 6.7 - initial swan #s w/ low output and elevated filling pressures: CO/CI 1.49/0.84, RA 16, PCWP 26, PAPi 0.74. Co-ox 39% - Suspect PVCs playing a role.  Continue po amio to suppress PVCs.  - CO-OX 55% on milrinone 0.25 mcg.   - CVP down to 6 and creatinine trending up. Hold diuretics.  - Continue Bidil 2 tablet tid  - Continue spiro.  - No dig with elevated creatinine.  - Hold off on losartan with elevated creatinine.  - not candidate for mechanical support with PAD and severe AI - Palliative Care discussions ongoing. Per Dr Haroldine Laws --- I think she wil weather the acute storm better than I thought she might but now will have HF on top of her other chronic issues and not sure how good we can get her QOL. I feel we should continue to be aggressive for now but I feel setting limits with DNR/DNI is probably appropriate as heroic measure would not likely lead to a good outcome in someone this ill. - cMRI - Findings consistent with acute myocarditis. LVEF 11% RV reduced., Severe Biventricular Failure.    2. CAD - s/p CABG with LIMA to LAD and MVR (bioprosthetic) - Trop 407 -> 387->492.  - doubt ACS but cannot exclude ischemia as contributing factor to CM - No chest pain.  - continue asa, Plavix + statin   3. Moderate to severe AI - consider TEE - BCx NGTD  4. Lung cancer s/p resection - stable  5. Severe PAD - s/p left fem-pop and L CEA - bilateral RAS s/p stenting - stable  6. AKI - due to shock - Creatinine peaked at 2. Todays creatinine trending up 1.7>1.9>2>2.33  - Hold diuretics for now.  - Continue milrinone.   7. AMS - due to shock. Now resolved  8. Hypokalemia/hyponatremia:  - K 3.6  -Sodium up to 128.  - Restrict free water.  - Continue spiro  9. PVCs - PVCs suppressed. Continue amio 200  mg twice a day.   -Keep K> 4. Mg > 2.0 - no b-blocker with shock   10. Microcytic anemia - Iron stores low. Give feraheme.   11. DNR/DNI    Length of Stay: [place], NP  05/27/2019, 1:37 PM  Advanced Heart Failure Team Pager [phone] (M-F; [place])  Please contact [facility] for night-coverage after hours (4p -7a ) and weekends on [email or web address]  Patient seen and examined with the above-signed Advanced Practice Provider and/or Housestaff. I personally reviewed laboratory data, imaging studies and relevant notes. I independently examined the patient and formulated the important aspects of the  plan. I have edited the note to reflect any of my changes or salient points. I have personally discussed the plan with the patient and/or family.  Milrinone increased yesterday and IV lasix increased due to recurrent shock. Has duiresed well. CVP down to 5. Feeling better. Less dyspnea. Nausea resoved but creatinine is up. Co-ox still marginal   On exam less dyspneic Frail cachetic CVP 5 Cor tachy regular + s3 Lungs clear Ab soft NT Ext warm no edema  She remains very tenuous and inotrope dependent. I suspect we will likely need to send her home on inotropes for a short period of time (which may preclude most SNFs) in hopes that her heart will eventually recover some from her viral myocarditis. Appreciate Palliative Care's ongoing discussions with her and her family.   Glori Bickers, MD  9:46 PM

## 2019-05-27 NOTE — NC FL2 (Signed)
[place] LEVEL OF CARE SCREENING TOOL     IDENTIFICATION  Patient Name: Leah [name] Birthdate: [birth year] Sex: female Admission Date (Current Location): 05/18/2019  [facility] and [place] Number:  Herbalist and Address:  [facility], [place] [address], [place], [place] [address]      Provider Number: [id number]  Attending Physician Name and Address:  Jolaine Artist, MD  Relative Name and Phone Number:  [name] (son) [phone]    Current Level of Care: Hospital Recommended Level of Care: [place] Prior Approval Number:    Date Approved/Denied:   PASRR Number: [id number]  Discharge Plan: SNF    Current Diagnoses: Patient Active Problem List   Diagnosis Date Noted  . NSTEMI (non-ST elevated myocardial infarction) ([place])   . Palliative care by specialist   . Protein-calorie malnutrition, severe 05/19/2019  . AMS (altered mental status) 05/18/2019  . Cardiogenic shock ([place]) 05/18/2019  . AKI (acute kidney injury) ([place])   . Shortness of breath 04/15/2019  . Thoracic aortic aneurysm without rupture ([place]) 09/03/2018  . Viral upper respiratory illness 03/16/2018  . Hypokalemia 02/12/2018  . Claudication ([place]) 11/21/2017  . Seasonal allergies 07/28/2017  . Bilateral sciatica 07/02/2017  . Bilateral hearing loss due to cerumen impaction 07/02/2017  . CHF (congestive heart failure) ([place]) 06/05/2017  . Bronchogenic lung cancer, right ([place]) 11/29/2016  . Dysphagia 08/26/2016  . Health care maintenance 04/17/2016  . Dizziness 04/17/2016  . Decreased visual acuity 01/17/2016  . Depressed mood 07/27/2015  . S/P MVR (mitral valve replacement) 12/05/2014  . Moderate aortic regurgitation 10/26/2014  . Renal vascular disease 10/26/2014  . Acute on chronic diastolic congestive heart failure ([place]) 10/21/2014  . Tobacco use 10/21/2014  . Diastolic dysfunction, grade 2 by echo June 2016 10/21/2014  . Carpal  tunnel syndrome 10/17/2014  . Severe mitral regurgitation 08/25/2014  . Unstable angina ([place]) 08/22/2014  . Low back pain 07/05/2012  . PVC (premature ventricular contraction) 10/02/2011  . History of angioedema with ACE 2013 06/14/2011  . PVD- s/p multiple proceedures 06/04/2011  . Hyperlipidemia 09/28/2008  . Essential hypertension, benign 09/28/2008  . CAD S/P LAD DES 2009 with 70% ISR 08/24/14 09/28/2008    Orientation RESPIRATION BLADDER Height & Weight     Self, Time, Situation, Place  Normal Continent, External catheter Weight: 140 lb 3.4 oz (63.6 kg) Height:  6\' 1"  (185.4 cm)  BEHAVIORAL SYMPTOMS/MOOD NEUROLOGICAL BOWEL NUTRITION STATUS      Continent Diet(see discharge summary)  AMBULATORY STATUS COMMUNICATION OF NEEDS Skin   Limited Assist Verbally Normal                       Personal Care Assistance Level of Assistance  Bathing, Feeding, Dressing, Total care Bathing Assistance: Limited assistance Feeding assistance: Independent   Total Care Assistance: Limited assistance   Functional Limitations Info  Sight, Hearing, Speech Sight Info: Adequate Hearing Info: Adequate Speech Info: Adequate    SPECIAL CARE FACTORS FREQUENCY  PT (By licensed PT), OT (By licensed OT)     PT Frequency: min 5x weekly OT Frequency: min 5x weekly            Contractures Contractures Info: Not present    Additional Factors Info  Code Status, Allergies Code Status Info: DNR Allergies Info: Lisinopril, Chantix (varenicline), Penicillins           Current Medications (05/27/2019):  This is the current hospital active medication list  Current Facility-Administered Medications  Medication Dose Route Frequency Provider Last Rate Last Admin  . acetaminophen (TYLENOL) tablet 650 mg  650 mg Oral Q4H PRN Bensimhon, Shaune Pascal, MD   650 mg at 05/27/19 0929  . albuterol (VENTOLIN HFA) 108 (90 Base) MCG/ACT inhaler 2 puff  2 puff Inhalation Q6H PRN Bensimhon, Shaune Pascal, MD   2 puff at  05/20/19 [phone]  . amiodarone (PACERONE) tablet 200 mg  200 mg Oral BID Bensimhon, Shaune Pascal, MD   200 mg at 05/27/19 0929  . aspirin chewable tablet 81 mg  81 mg Oral Daily Bensimhon, Shaune Pascal, MD   81 mg at 05/27/19 0930  . atorvastatin (LIPITOR) tablet 80 mg  80 mg Oral Daily Bensimhon, Shaune Pascal, MD   80 mg at 05/27/19 0930  . Chlorhexidine Gluconate Cloth 2 % PADS 6 each  6 each Topical Daily Bensimhon, Shaune Pascal, MD   6 each at 05/27/19 1021  . clopidogrel (PLAVIX) tablet 75 mg  75 mg Oral Daily Bensimhon, Shaune Pascal, MD   75 mg at 05/27/19 0930  . cyclobenzaprine (FLEXERIL) tablet 10 mg  10 mg Oral TID PRN Bensimhon, Shaune Pascal, MD   10 mg at 05/27/19 0930  . enoxaparin (LOVENOX) injection 30 mg  30 mg Subcutaneous Q24H Bensimhon, Shaune Pascal, MD   30 mg at 05/26/19 2149  . ezetimibe (ZETIA) tablet 10 mg  10 mg Oral Daily Bensimhon, Shaune Pascal, MD   10 mg at 05/27/19 1059  . feeding supplement (ENSURE ENLIVE) (ENSURE ENLIVE) liquid 237 mL  237 mL Oral TID BM Bensimhon, Shaune Pascal, MD   237 mL at 05/27/19 1355  . ferumoxytol (FERAHEME) 510 mg in sodium chloride 0.9 % 100 mL IVPB  510 mg Intravenous Weekly Clegg, Amy D, NP   Stopped at 05/24/19 1314  . fluticasone (FLOVENT HFA) 44 MCG/ACT inhaler 1 puff  1 puff Inhalation Daily Bensimhon, Shaune Pascal, MD   1 puff at 05/27/19 [phone]  . gabapentin (NEURONTIN) capsule 200 mg  200 mg Oral TID Bensimhon, Shaune Pascal, MD   200 mg at 05/27/19 1058  . isosorbide-hydrALAZINE (BIDIL) 20-37.5 MG per tablet 2 tablet  2 tablet Oral TID Bensimhon, Shaune Pascal, MD   2 tablet at 05/27/19 1059  . milrinone (PRIMACOR) 20 MG/100 ML (0.2 mg/mL) infusion  0.25 mcg/kg/min Intravenous Continuous Clegg, Amy D, NP 4.76 mL/hr at 05/27/19 0900 0.25 mcg/kg/min at 05/27/19 0900  . multivitamin with minerals tablet 1 tablet  1 tablet Oral Daily Bensimhon, Shaune Pascal, MD   1 tablet at 05/27/19 1058  . ondansetron (ZOFRAN) injection 4 mg  4 mg Intravenous Q6H PRN Bensimhon, Shaune Pascal, MD      .  polyethylene glycol (MIRALAX / GLYCOLAX) packet 17 g  17 g Oral Daily PRN Bensimhon, Shaune Pascal, MD   17 g at 05/26/19 1110  . senna (SENOKOT) tablet 8.6 mg  1 tablet Oral Daily Vinie Sill C, NP   8.6 mg at 05/27/19 1355  . spironolactone (ALDACTONE) tablet 12.5 mg  12.5 mg Oral Daily Bensimhon, Shaune Pascal, MD   12.5 mg at 05/27/19 1058     Discharge Medications: Please see discharge summary for a list of discharge medications.  Relevant Imaging Results:  Relevant Lab Results:   Additional Information SSN: [id number]  Alberteen Sam, LCSW

## 2019-05-28 LAB — COOXEMETRY PANEL
Carboxyhemoglobin: 1.2 % (ref 0.5–1.5)
Methemoglobin: 0.7 % (ref 0.0–1.5)
O2 Saturation: 59.6 %
Total hemoglobin: 11.4 g/dL — ABNORMAL LOW (ref 12.0–16.0)

## 2019-05-28 LAB — CBC
HCT: 29.8 % — ABNORMAL LOW (ref 36.0–46.0)
Hemoglobin: 10.7 g/dL — ABNORMAL LOW (ref 12.0–15.0)
MCH: 25.2 pg — ABNORMAL LOW (ref 26.0–34.0)
MCHC: 35.9 g/dL (ref 30.0–36.0)
MCV: 70.1 fL — ABNORMAL LOW (ref 80.0–100.0)
Platelets: 174 10*3/uL (ref 150–400)
RBC: 4.25 MIL/uL (ref 3.87–5.11)
RDW: 19.6 % — ABNORMAL HIGH (ref 11.5–15.5)
WBC: 17.6 10*3/uL — ABNORMAL HIGH (ref 4.0–10.5)
nRBC: 0.2 % (ref 0.0–0.2)

## 2019-05-28 LAB — BASIC METABOLIC PANEL
Anion gap: 14 (ref 5–15)
BUN: 52 mg/dL — ABNORMAL HIGH (ref 8–23)
CO2: 23 mmol/L (ref 22–32)
Calcium: 8.1 mg/dL — ABNORMAL LOW (ref 8.9–10.3)
Chloride: 88 mmol/L — ABNORMAL LOW (ref 98–111)
Creatinine, Ser: 2.56 mg/dL — ABNORMAL HIGH (ref 0.44–1.00)
GFR calc Af Amer: 22 mL/min — ABNORMAL LOW (ref >60)
GFR calc non Af Amer: 19 mL/min — ABNORMAL LOW (ref >60)
Glucose, Bld: 137 mg/dL — ABNORMAL HIGH (ref 70–99)
Potassium: 3.2 mmol/L — ABNORMAL LOW (ref 3.5–5.1)
Sodium: 125 mmol/L — ABNORMAL LOW (ref 135–145)

## 2019-05-28 LAB — URINALYSIS, ROUTINE W REFLEX MICROSCOPIC
Bilirubin Urine: NEGATIVE
Glucose, UA: NEGATIVE mg/dL
Hgb urine dipstick: NEGATIVE
Ketones, ur: NEGATIVE mg/dL
Nitrite: NEGATIVE
Protein, ur: NEGATIVE mg/dL
Specific Gravity, Urine: 1.008 (ref 1.005–1.030)
pH: 6 (ref 5.0–8.0)

## 2019-05-28 LAB — MAGNESIUM: Magnesium: 2.3 mg/dL (ref 1.7–2.4)

## 2019-05-28 LAB — LACTIC ACID, PLASMA
Lactic Acid, Venous: 1.2 mmol/L (ref 0.5–1.9)
Lactic Acid, Venous: 1.3 mmol/L (ref 0.5–1.9)

## 2019-05-28 LAB — PROCALCITONIN: Procalcitonin: 1.02 ng/mL

## 2019-05-28 MED ORDER — SENNA 8.6 MG PO TABS
2.0000 | ORAL_TABLET | Freq: Every day | ORAL | Status: DC
  Administered 2019-05-28 – 2019-05-29 (×2): 17.2 mg via ORAL
  Filled 2019-05-28 (×2): qty 2

## 2019-05-28 MED ORDER — VANCOMYCIN HCL IN DEXTROSE 1-5 GM/200ML-% IV SOLN
1000.0000 mg | INTRAVENOUS | Status: DC
  Administered 2019-05-28 – 2019-05-30 (×2): 1000 mg via INTRAVENOUS
  Filled 2019-05-28 (×2): qty 200

## 2019-05-28 MED ORDER — CARBAMIDE PEROXIDE 6.5 % OT SOLN
5.0000 [drp] | Freq: Every morning | OTIC | Status: AC
  Administered 2019-05-29 – 2019-05-31 (×3): 5 [drp] via OTIC
  Filled 2019-05-28: qty 15

## 2019-05-28 MED ORDER — SODIUM CHLORIDE 0.9 % IV SOLN
2.0000 g | INTRAVENOUS | Status: DC
  Administered 2019-05-28 – 2019-05-31 (×4): 2 g via INTRAVENOUS
  Filled 2019-05-28 (×5): qty 2

## 2019-05-28 MED ORDER — VANCOMYCIN HCL IN DEXTROSE 1-5 GM/200ML-% IV SOLN
1000.0000 mg | Freq: Once | INTRAVENOUS | Status: DC
  Filled 2019-05-28: qty 200

## 2019-05-28 MED ORDER — POTASSIUM CHLORIDE CRYS ER 20 MEQ PO TBCR
40.0000 meq | EXTENDED_RELEASE_TABLET | Freq: Once | ORAL | Status: AC
  Administered 2019-05-28: 40 meq via ORAL

## 2019-05-28 NOTE — Plan of Care (Signed)

## 2019-05-28 NOTE — Progress Notes (Signed)
Went to reevaluate patient giving concerns for lethargy.  Patient is more lethargic than previous evaluations.  She is sitting up in chair with lunch in front of her.  She is alert and oriented to person place and time.  Her complaint to me is that her left ear feels muffled.  Exam with otoscope showed left ear canal obstructed with cerumen.  Attempted to remove cerumen with ear irrigation using warm saline and peroxide.  Had success with clearing significant amount of cerumen but there was still some remaining.  Patient did not want any more irrigation of her ear so we will attempt Debrox drops.  Regarding concern for sepsis, patient was afebrile.  Urine and blood cultures were collected.  Cefepime was started and vancomycin was about to be started.

## 2019-05-28 NOTE — Progress Notes (Signed)
Family Medicine Teaching Service Daily Progress Note Intern Pager: [phone]  Patient name: Leah [name] Medical record number: [id number] Date of birth: 02/24/1951 Age: 69 y.o. Gender: female  Primary Care Provider: Guadalupe Dawn, MD Consultants: Heart Failure Code Status: Full   Pt Overview and Major Events to Date:  2/23 admitted in cardiogenic shock to ICU 2/28 transferred to floor   Assessment and Plan: Leah [name] a 69 y.o.femalewho presented with altered mental status, found to be in cardiogenic shock 2/2 viral cardiomyopathy. PMH is significant for CAD, HTN, HLD, PVD, HFpEF (now HFrEF EF 10%), CKD, MV replacement, Hx bronchogenic lung cancer, and tobacco use.  Cardiogenic Shock with biventricular dysfunction, secondary to acute viral myocarditis Has been monitored in ICU and treated by Dr. Haroldine [name].  Transferred to floor on 2/28.  Currently on milrinone gtt 0.0.25 mcg/kg/min.  For this admission with EF decreased from 40 to 10% with severe RV dysfunction and moderate to severe AI.   Weight up 1 kg  from yesterday.  Cardiac MRI 3/1 findings consistent with acute myocarditis.  Severe left ventricular dilation and severe diastolic dysfunction with an EF calculated at 11%.  It also showed normal right ventricular size with RVEF of 18%.  Patient has worsening edema of the lower extremities - f/u HF, appreciate recs - cont bidil 2 tablets TID  - cont spiro 12.5 mg QD - cont milrinone per cards increased to 0.25 yesterday  -Consider low-dose digoxin but not at this time due to elevated creatinine - possible cath during week pending Cr if 1.5 or better -IV Lasix yesterday but holding at this time due to creatinine trending up -Elevate legs when possible  -Consider topical medications or lidocaine patch to help with the lower extremity pain -patient has as needed Tylenol for pain  Leukocytosis On evaluation this morning patient had been sleeping but awoke easily.   Per chart, when she was evaluated by other providers she was lethargic compared to baseline.  CBC this morning showed an elevated WBC of 17.6 <14.4<11.5.  Lactic acid stable at 1.2.  Procalcitonin 1.02 -Blood cultures and urine culture ordered -Urinalysis ordered -Cefepime per pharmacy -Vancomycin per pharmacy  CAD S/P CABG with LIMA to LAD and MVR (bioprosthetic).  Trop 676>720>947.  Home medications include Bildil, Lasix, Nitrostat, Metoprolol, ASA, Plavix.  Consider could be contributing to cardiomyopathy. - cont plan per above - cont ASA, Plavix - cont lipitor 80mg , zetia 10 mg - holding BB in setting of cardiogenic shock, on amio instead - holding lasix, now on spiro -Patient is going to receive 1 dose of Lasix today due to mild increase in shortness of breath as well as edema in lower extremities  Moderate to severe AI Blood cultures no growth at 5 days. - cont cards plan per above  Goals of care Dr. Haroldine [name] reports that he has had conversations with family in regards to palliative care and they continue to want full scope care.  Overall, patient will need aggressive rehab and nutrition assistance.  Lives with her daughter. - PT/OT: recommend SNF, will speak with family regarding plan - nutrition consult - s/p palliative discussions, patient is now DNR but continue with aggressive care  Lung cancer s/p resection Currently on 3 liters per .  No home O2. - stable, cardiac plan per above  Severe PAD  S/p left femoropopliteal and L CEA - bilateral RAS s/p stenting - stable - cont statin and zetia per above  AKI On CKD: Improving Baseline appears to  be 1.0-1.3.  2/2 cardiogenic shock.  Cr 2.33>>1.66>1.86>2.33.  Improving with treatment of cardiogenic shock. - cont milrinone per above  AMS Initial problem on presentation.  2/2 cardiogenic shock.  Patient was mildly lethargic this morning. -We will reevaluate this afternoon - cont to monitor for changes in mental  status  Hypervolemic hyponatremia 136>>125>128.  Likely 2/2 HF. - cont spironolactone - monitor fluid status -1500 mL fluid restriction -If sodium continues to drop consider tolvaptan  Frequent PVCs Improving with amiodarone.  Goal to keep K greater than 4 and mag greater than 2.  K 3.6, Mag 1.7. - replete K 40 mEq x1 - replete Mag sulfate 2g x1 - monitor BMP and Mag daily -Continue p.o. amiodarone 200 mg twice daily -Avoid beta-blocker secondary to shock  Iron Deficiency Anemia  Iron 20, Sat 8.  Hgb 10.7, MCV 1.9.  Ferritin 120.  On admission Hgb was 14.6.  Last colonoscopy 2018 by Dr. Havery [name] with tubular adenoma and hyperplastic polyp removed.  Also noted diverticulosis.  Due for repeat colonoscopy in March 2021.  Denies any hematochezia or melena. - start ferrous sulfate QOD - outpatient colonoscopy - transfusion threshold 8 given CAD   FEN/GI: Dysphagia 3 Diet PPx: Lovenox  Disposition:  planning for SNF when stable from cardiac standpoint  Subjective:  Patient was sleeping when I entered the room.  She easily awoke.  She reported that she felt the pain in her legs had decreased.  Patient denies any shortness of breath, chest pain, fever.  Objective: Temp:  [97.2 F (36.2 C)-97.8 F (36.6 C)] 97.7 F (36.5 C) (03/05 0755) Pulse Rate:  [94-109] 102 (03/05 0350) Resp:  [13-18] 13 (03/05 0755) BP: (100-120)/(48-70) 114/62 (03/05 0755) SpO2:  [96 %-97 %] 96 % (03/05 0755) Weight:  [64.6 kg] 64.6 kg (03/05 0350)  Physical Exam:  General: Patient was sleeping when I enter the room.  Awoke easily. Cardio: tachycardic in the 110s, irregular, frequent PVCs, systolic murmur at RUSB Lungs: Clear to auscultation bilaterally, no crackles or wheezes noted.  Normal work of breathing Skin: warm and dry  Extremities: +1 bilateral lower extremity edema.  Edema is to mid shin level. Neuro: Easily aroused.  Moves all 4 extremities   Laboratory: Recent Labs  Lab  05/26/19 0502 05/27/19 0500 05/28/19 0719  WBC 11.5* 14.4* 17.6*  HGB 10.2* 10.8* 10.7*  HCT 29.0* 29.9* 29.8*  PLT 98* 143* 174   Recent Labs  Lab 05/26/19 0502 05/27/19 0500 05/28/19 0719  NA 125* 128* 125*  K 3.7 3.6 3.2*  CL 88* 92* 88*  CO2 22 23 23   BUN 43* 47* 52*  CREATININE 1.98* 2.33* 2.56*  CALCIUM 8.1* 8.3* 8.1*  GLUCOSE 190* 92 137*     Imaging/Diagnostic Tests: No results found.  Gifford Shave, MD 05/28/2019, 1:28 PM PGY-1, New Site Intern pager: [phone], text pages welcome

## 2019-05-28 NOTE — Progress Notes (Signed)
Occupational Therapy Treatment Patient Details Name: Leah [name] MRN: [id number] DOB: 13-May-1950 Today's Date: 05/28/2019    History of present illness 69 y.o. 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 Pt brought to the ED for AMS, now with concern for cardiogenic shock.   OT comments  Pt. Seen with PT for skilled treatment session.  Pt. Able to complete bed mobility with min guard a.  Ambulation to recliner min a.  Limited session secondary to pt. Reported SOB and aches in BLEs.  Cues for breathing strategies throughout session.   Initial 02 reading around 77% after transfer to chair but equipment was not working properly.  With new equipment in place pt. 02 rebounded to around 92%  Pt. Reported feeling better once seated.  Rn present for this portion of session and made aware of pts. o2 sats.     Follow Up Recommendations  SNF;Supervision/Assistance - 24 hour    Equipment Recommendations  None recommended by OT    Recommendations for Other Services      Precautions / Restrictions Precautions Precautions: Fall Precaution Comments: tachy Restrictions Weight Bearing Restrictions: No       Mobility Bed Mobility Overal bed mobility: Needs Assistance Bed Mobility: Supine to Sit     Supine to sit: Min guard;HOB elevated     General bed mobility comments: cues for hand placement on bed rails while pulling into sitting, no physical assistance required  Transfers Overall transfer level: Needs assistance Equipment used: Rolling walker (2 wheeled) Transfers: Sit to/from [facility] Sit to Stand: Min assist Stand pivot transfers: Min assist            Balance                                           ADL either performed or assessed with clinical judgement    ADL Overall ADL's : Needs assistance/impaired                     Lower Body Dressing: Minimal assistance;Sitting/lateral leans Lower Body Dressing Details (indicate cue type and reason): sitting on bed, reached forward to reach BLES for adjusting and donning socks-some assistance required for compression socks Toilet Transfer: Moderate assistance;Stand-pivot;RW Toilet Transfer Details (indicate cue type and reason): simulated to recliner          Functional mobility during ADLs: Moderate assistance;Minimal assistance;Cueing for safety;Rolling walker General ADL Comments: decreased activity tolerance c/o SOB "i dont think im going to be able to walk with yall today" and requested to sit down     Vision       Perception     Praxis      Cognition Arousal/Alertness: Lethargic Behavior During Therapy: Flat affect Overall Cognitive Status: Impaired/Different from baseline                   Orientation Level: (knew the month but got the year wrong, states 2001.  but was able to read the clock and tell time for when her show would start)           Problem Solving: Slow processing;Requires verbal cues General Comments: pt following commands appropriately, sequencing well, but slow with  responses        Exercises     Shoulder Instructions       General Comments      Pertinent Vitals/ Pain       Pain Assessment: 0-10 Pain Score: 5  Pain Location: bilat legs Pain Descriptors / Indicators: Aching Pain Intervention(s): Limited activity within patient's tolerance;Monitored during session;Repositioned  Home Living                                          Prior Functioning/Environment              Frequency  Min 2X/week        Progress Toward Goals  OT Goals(current goals can now be found in the care plan section)  Progress towards OT goals: Progressing toward goals     Plan Discharge plan remains appropriate;Frequency  remains appropriate    Co-evaluation      Reason for Co-Treatment: To address functional/ADL transfers;Complexity of the patient's impairments (multi-system involvement)   OT goals addressed during session: ADL's and self-care      AM-PAC OT "6 Clicks" Daily Activity     Outcome Measure   Help from another person eating meals?: None Help from another person taking care of personal grooming?: A Little Help from another person toileting, which includes using toliet, bedpan, or urinal?: A Little Help from another person bathing (including washing, rinsing, drying)?: A Little Help from another person to put on and taking off regular upper body clothing?: A Little Help from another person to put on and taking off regular lower body clothing?: A Little 6 Click Score: 19    End of Session Equipment Utilized During Treatment: Rolling walker;Gait belt      Activity Tolerance Patient limited by fatigue   Patient Left in chair;with call bell/phone within reach   Nurse Communication Other (comment)(reivewed with RN pt. c/o SOB and if o2 was used previously or needed)        Time: 1287-8676 OT Time Calculation (min): 25 min  Charges: OT General Charges $OT Visit: 1 Visit OT Treatments $Self Care/Home Management : 8-22 mins  Sonia Baller, COTA/L Acute Rehabilitation [phone]   Janice Coffin 05/28/2019, 1:23 PM

## 2019-05-28 NOTE — Progress Notes (Signed)
Physical Therapy Treatment Patient Details Name: Leah [name] MRN: [id number] DOB: 1951/01/27 Today's Date: 05/28/2019    History of Present Illness 69 y.o. 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 Pt brought to the ED for AMS, now with concern for cardiogenic shock.    PT Comments    Patient not progressing with mobility today secondary to dizziness and SOB. Requires Mod A to stand from elevated bed height with RW and able to take a few steps in room but became anxious and short of breath resulting in need to sit. "I don't think I can walk today." Sp02 not a great reading ranging from 70% (poor wave) and jumping up to 90% after pursed lip breathing. Instructed pt in there ex to perform today and through the weekend. Will continue to follow.   Follow Up Recommendations  SNF;Supervision/Assistance - 24 hour     Equipment Recommendations  Rolling walker with 5" wheels    Recommendations for Other Services       Precautions / Restrictions Precautions Precautions: Fall Precaution Comments: watch HR and 02 Restrictions Weight Bearing Restrictions: No    Mobility  Bed Mobility Overal bed mobility: Needs Assistance Bed Mobility: Supine to Sit     Supine to sit: Min guard;HOB elevated     General bed mobility comments: cues for hand placement on bed rails while pulling into sitting, no physical assistance required  Transfers Overall transfer level: Needs assistance Equipment used: Rolling walker (2 wheeled) Transfers: Sit to/from Stand Sit to Stand: From elevated surface;Mod assist Stand pivot transfers: Min assist       General transfer comment: Mod A to power to standing with use of RW, cues for hand placement/technique. Transferred to chair post ambulation  Ambulation/Gait Ambulation/Gait  assistance: +2 safety/equipment;Min assist Gait Distance (Feet): 6 Feet Assistive device: Rolling walker (2 wheeled) Gait Pattern/deviations: Decreased stride length;Step-through pattern;Trunk flexed Gait velocity: decreased   General Gait Details: Slow, unsteady gait with flexed trunk; cues for upright posture. Pt became anxious and reporting SOB after taking a few steps; sat down in chair and performed pursed lip breathing.   Stairs             Wheelchair Mobility    Modified Rankin (Stroke Patients Only)       Balance Overall balance assessment: Needs assistance Sitting-balance support: Feet supported;No upper extremity supported Sitting balance-[name]: Fair Sitting balance - Comments: min guard to close supervision for safety    Standing balance support: During functional activity;Bilateral upper extremity supported Standing balance-[name]: Poor Standing balance comment: relaint on BUE and external support                            Cognition Arousal/Alertness: Lethargic Behavior During Therapy: Flat affect Overall Cognitive Status: Impaired/Different from baseline Area of Impairment: Problem solving                 Orientation Level: (knew the month but got the year wrong, states 2001.  but was able to read the clock and tell time for when her show would start)           Problem Solving: Slow processing;Requires verbal cues General Comments: pt following commands appropriately, sequencing well, but slow with responses, knew it was March  but stated "2001"      Exercises      General Comments General comments (skin integrity, edema, etc.): Sp02 on RA, HR ranged from 110-130s; dizziness upon sitting EOB. Difficulty getting SP02 reading, read ~70% with poor wave form and quickly jumped up to 90s with PLB. RN aware.      Pertinent Vitals/Pain Pain Assessment: 0-10 Pain Score: 5  Pain Location: bilat legs Pain Descriptors /  Indicators: Aching Pain Intervention(s): Repositioned;Monitored during session;Limited activity within patient's tolerance    Home Living                      Prior Function            PT Goals (current goals can now be found in the care plan section) Progress towards PT goals: Not progressing toward goals - comment(limited by SOB today)    Frequency    Min 3X/week      PT Plan Current plan remains appropriate    Co-evaluation PT/OT/SLP Co-Evaluation/Treatment: Yes Reason for Co-Treatment: Complexity of the patient's impairments (multi-system involvement);To address functional/ADL transfers PT goals addressed during session: Mobility/safety with mobility;Balance OT goals addressed during session: ADL's and self-care      AM-PAC PT "6 Clicks" Mobility   Outcome Measure  Help needed turning from your back to your side while in a flat bed without using bedrails?: A Little Help needed moving from lying on your back to sitting on the side of a flat bed without using bedrails?: A Little Help needed moving to and from a bed to a chair (including a wheelchair)?: A Little Help needed standing up from a chair using your arms (e.g., wheelchair or bedside chair)?: A Little Help needed to walk in hospital room?: A Little Help needed climbing 3-5 steps with a railing? : A Lot 6 Click Score: 17    End of Session Equipment Utilized During Treatment: Gait belt Activity Tolerance: Treatment limited secondary to medical complications (Comment)(SOB) Patient left: in chair;with call bell/phone within reach Nurse Communication: Mobility status PT Visit Diagnosis: Unsteadiness on feet (R26.81)     Time: 5686-1683 PT Time Calculation (min) (ACUTE ONLY): 24 min  Charges:  $Therapeutic Activity: 8-22 mins                     [name], PT, DPT Acute Rehabilitation Services Pager [phone] Office [phone]       Marguarite Arbour A [place] 05/28/2019, 4:02 PM

## 2019-05-28 NOTE — Progress Notes (Signed)
CVP 5, patient breakfast set up for her, encouraged eating

## 2019-05-28 NOTE — Progress Notes (Signed)
Pharmacy Antibiotic Note  Leah [name] is a 69 y.o. female admitted on 05/18/2019 with heart failure, now with increased wbc wnl >17, remains afebrile, but overall not feeling well and increased lactic acid 2, tachycardia 115.  Will pan culture and begin empiric abx. Cr elevated  2.5  Pharmacy has been consulted for vancomycin and cefepime  dosing.  Plan: Cefepime 2gm IV q24h Vancomycin 1gm q48h  Monitor renal function check level at ss  Height: 6\' 1"  (185.4 cm) Weight: 142 lb 6.7 oz (64.6 kg) IBW/kg (Calculated) : 75.4  Temp (24hrs), Avg:97.5 F (36.4 C), Min:97.2 F (36.2 C), Max:97.8 F (36.6 C)  Recent Labs  Lab 05/24/19 0546 05/25/19 0457 05/26/19 0502 05/27/19 0500 05/27/19 1012 05/27/19 1345 05/28/19 0719  WBC 10.4 10.7* 11.5* 14.4*  --   --  17.6*  CREATININE 1.66* 1.86* 1.98* 2.33*  --   --  2.56*  LATICACIDVEN  --   --   --   --  2.0* 1.2  --     Estimated Creatinine Clearance: 22 mL/min (A) (by C-G formula based on SCr of 2.56 mg/dL (H)).    Allergies  Allergen Reactions  . Lisinopril Swelling    Angioedema 06/10/11  . Chantix [Varenicline] Other (See Comments)    Caused insomnia  . Penicillins Hives    Did it involve swelling of the face/tongue/throat, SOB, or low BP? No Did it involve sudden or severe rash/hives, skin peeling, or any reaction on the inside of your mouth or nose? No Did you need to seek medical attention at a hospital or doctor's office? No When did it last happen?50 Years If all above answers are "NO", may proceed with cephalosporin use.      Antimicrobials this admission:  Dose adjustments this admission:   Microbiology results:   Bonnita Nasuti Pharm.D. CPP, BCPS Clinical Pharmacist [phone] 05/28/2019 11:04 AM

## 2019-05-28 NOTE — Progress Notes (Signed)
Palliative:  HPI:68 y.o.femalewith past medical history of systolic CHF (EF 61% 4709), CAD s/p stenting 2016, mitral valve replacement 2016, HTN, HLD, COPD, tobacco use, lung adenocarcinoma stage 1 s/p resection 2018, PVD/PADadmitted on2/23/2021with cardiogenic shock and EF decreased to 10%, severe RV dysfunction, severe aortic insufficiency as well with likely acute viral myocarditis heart failure team.Required ICU admission with milrinone and bicarb infusion.Progressed to step down status.   I met today with Leah [name]. She does seem more sleepy today and not as talkative as she has been previous days during my visits. Noted worsening renal function and concern this could be leading to more lethargy today. I explained to her that I am worried about her kidneys and this is a bad problem especially with her severe heart dysfunction. She does not feel like she would want to do dialysis and is sure she would not want this for a long time. I also explained that I do not feel that she would be a good candidate for dialysis given her severe heart failure and the stress dialysis would be on her heart. She understands. We discussed that if her kidneys continue to worsen this is very bad and we should bring in her children to have another conversation and plan. She agrees.   All questions/concerns addressed. Emotional support provided. Discussed plan with Leah Hurry, RN.   Exam: Alert but lethargic. Oriented. HR regular. Breathing regular, unlabored. Abd still distended. Generalized weakness.   Plan: - Renal function worsening. She is more lethargic today.  - I will have my colleague Leah Jenny, PA shadow for need to meet with family again.  - Increased Senokot and will receive another dose of sorbitol this afternoon if continues with constipation today.   25 min  Leah Sill, NP Palliative Medicine Team Pager [phone] (Please see [email or web address] for schedule) Team Phone [phone]     Greater than 50%  of this time was spent counseling and coordinating care related to the above assessment and plan

## 2019-05-28 NOTE — Progress Notes (Signed)
Advanced Heart Failure Rounding Note  PCP-Cardiologist: Peter Martinique, MD   Subjective:    CO-OX 60% on milrinone 0.25 mcg   CMRI-Findings consistent with acute myocarditis. LVEF 11% RVEF 18% (severe biventricular dysfunction)  Poor UOP despite IV Lasix, only 800 cc charted yesterday. Scr trending up, 1.98>>2.33>>2.56 Na 125   WBC rising, 14.4>>17.6   In bed, appears lethargic. Feels "tired". Denies dyspnea. No supp O2 requirements. Denies pain    Objective:   Weight Range: 64.6 kg Body mass index is 18.79 kg/m.   Vital Signs:   Temp:  [97.2 F (36.2 C)-97.8 F (36.6 C)] 97.7 F (36.5 C) (03/05 0755) Pulse Rate:  [92-109] 102 (03/05 0350) Resp:  [13-20] 13 (03/05 0755) BP: (100-129)/(48-70) 114/62 (03/05 0755) SpO2:  [96 %-97 %] 96 % (03/05 0755) Weight:  [64.6 kg] 64.6 kg (03/05 0350) Last BM Date: (pt unable to confirm)  Weight change: Filed Weights   05/26/19 0340 05/27/19 0348 05/28/19 0350  Weight: 63.9 kg 63.6 kg 64.6 kg    Intake/Output:   Intake/Output Summary (Last 24 hours) at 05/28/2019 0954 Last data filed at 05/27/2019 1800 Gross per 24 hour  Intake 77.25 ml  Output 800 ml  Net -722.75 ml      Physical Exam  CVP 5 General:  Thin cachetic AAF. Looks much older than actual age No resp difficulty lethargic HEENT: normal anicteric  Neck: supple. no JVD. Carotids 2+ bilat; no bruits. No lymphadenopathy or thryomegaly appreciated. Cor: PMI nondisplaced. Regular rhythm tachy w/ frequent PVCs. No rubs or murmurs. + S3  Lungs: clear no wheeze Abdomen: soft, nontender, nondistended. No hepatosplenomegaly. No bruits or masses. Good bowel sounds. Extremities: cachetic  no cyanosis, clubbing, rash, trace- 1+ bilateral ankle edema Neuro: alert & orientedx3, but lethargic. Non-focal moves all 4    Telemetry   Sinus Tach 100s w/ frequent PVCs Personally reviewed   Labs    CBC Recent Labs    05/27/19 0500 05/28/19 0719  WBC 14.4* 17.6*  HGB  10.8* 10.7*  HCT 29.9* 29.8*  MCV 70.2* 70.1*  PLT 143* 542   Basic Metabolic Panel Recent Labs    05/27/19 0500 05/28/19 0719  NA 128* 125*  K 3.6 3.2*  CL 92* 88*  CO2 23 23  GLUCOSE 92 137*  BUN 47* 52*  CREATININE 2.33* 2.56*  CALCIUM 8.3* 8.1*  MG 1.9 2.3   Liver Function Tests No results for input(s): AST, ALT, ALKPHOS, BILITOT, PROT, ALBUMIN in the last 72 hours. No results for input(s): LIPASE, AMYLASE in the last 72 hours. Cardiac Enzymes No results for input(s): CKTOTAL, CKMB, CKMBINDEX, TROPONINI in the last 72 hours.  BNP: BNP (last 3 results) Recent Labs    05/18/19 1707 05/21/19 1206  BNP >4,500.0* 1,490.6*    ProBNP (last 3 results) No results for input(s): PROBNP in the last 8760 hours.   D-Dimer No results for input(s): DDIMER in the last 72 hours. Hemoglobin A1C No results for input(s): HGBA1C in the last 72 hours. Fasting Lipid Panel No results for input(s): CHOL, HDL, LDLCALC, TRIG, CHOLHDL, LDLDIRECT in the last 72 hours. Thyroid Function Tests No results for input(s): TSH, T4TOTAL, T3FREE, THYROIDAB in the last 72 hours.  Invalid input(s): FREET3  Other results:   Imaging    No results found.   Medications:     Scheduled Medications: . amiodarone  200 mg Oral BID  . aspirin  81 mg Oral Daily  . atorvastatin  80 mg Oral Daily  .  Chlorhexidine Gluconate Cloth  6 each Topical Daily  . clopidogrel  75 mg Oral Daily  . enoxaparin (LOVENOX) injection  30 mg Subcutaneous Q24H  . ezetimibe  10 mg Oral Daily  . feeding supplement (ENSURE ENLIVE)  237 mL Oral TID BM  . fluticasone  1 puff Inhalation Daily  . gabapentin  200 mg Oral TID  . isosorbide-hydrALAZINE  2 tablet Oral TID  . multivitamin with minerals  1 tablet Oral Daily  . senna  2 tablet Oral Daily  . spironolactone  12.5 mg Oral Daily    Infusions: . ferumoxytol Stopped (05/24/19 1314)  . milrinone 0.25 mcg/kg/min (05/27/19 2216)    PRN  Medications: acetaminophen, albuterol, cyclobenzaprine, ondansetron (ZOFRAN) IV, polyethylene glycol, sorbitol    Assessment/Plan   1. Cardiogenic shock with sever biventricular dysfunction - Previous EF 40% in 10/18 - Echo in ER EF 10% with severe RV dysfunction. Moderate to severe AI - cMRI - Findings consistent with acute myocarditis. LVEF 11% RV EF 18%  - initial lactate > 6.7 - initial swan #s w/ low output and elevated filling pressures: CO/CI 1.49/0.84, RA 16, PCWP 26, PAPi 0.74. Co-ox 39% - Suspect PVCs playing a role.  Continue po amio to suppress PVCs.  - cMRI - Findings consistent with acute myocarditis. LVEF 11% RV reduced., Severe Biventricular Failure. - CO-OX 60% on milrinone 0.25 mcg.   - CVP down to 5 and creatinine trending up, 2.56 today. Hold diuretics.  - Continue Bidil 2 tablet tid  - Continue spiro.  - No dig with elevated creatinine.  - Hold off on losartan with elevated creatinine.  - not candidate for mechanical support with PAD and severe AI - Palliative Care discussions ongoing. Per Dr Haroldine Laws --- I think she wil weather the acute storm better than I thought she might but now will have HF on top of her other chronic issues and not sure how good we can get her QOL. I feel we should continue to be aggressive for now but I feel setting limits with DNR/DNI is probably appropriate as heroic measure would not likely lead to a good outcome in someone this ill.     2. CAD - s/p CABG with LIMA to LAD and MVR (bioprosthetic) - Trop 407 -> 387->492.  - doubt ACS but cannot exclude ischemia as contributing factor to CM - No chest pain.  - continue asa, Plavix + statin   3. Moderate to severe AI - consider TEE - BCx NGTD  4. Lung cancer s/p resection - stable  5. Severe PAD - s/p left fem-pop and L CEA - bilateral RAS s/p stenting - stable  6. AKI - due to shock - Creatinine peaked at 2, improved w/ milrinone but trending back up,  1.7>1.9>2>2.33>2.56 - CVP 5. Hold diuretics for now.  - Continue milrinone.   7. AMS - due to shock. A bit more lethargic today and WBC rising - ? If developing sepsis. BP stable. Repeat Lactic acid, PCT and blood cultures, urine cultures  8. Hypokalemia/hyponatremia:  - K 3.2 - Sodium up to 125.  - Restrict free water.  - Continue spiro  9. PVCs - PVCs suppressed. Continue amio 200 mg twice a day.   -Keep K> 4. Mg > 2.0 - no b-blocker with shock   10. Microcytic anemia - Iron stores low. Give feraheme.   11. DNR/DNI    Length of Stay: [address] PA-C  05/28/2019, 9:54 AM  Advanced Heart Failure Team  Pager [phone] (M-F; [place])  Please contact [facility] for night-coverage after hours (4p -7a ) and weekends on [email or web address]  Patient seen and examined with the above-signed Advanced Practice Provider and/or Housestaff. I personally reviewed laboratory data, imaging studies and relevant notes. I independently examined the patient and formulated the important aspects of the plan. I have edited the note to reflect any of my changes or salient points. I have personally discussed the plan with the patient and/or family.  Remains on milrinone. Co-ox improved but looks much worse today. AF but rising WBCs and AKI. More lethargic. CVP 5  Agree with suspicion for developing sepsis. Get cx, lactate and PCT. Start empiric abx until cx data back. Stop diuretics.   Very tenuous. Prognosis guarded.   Glori Bickers, MD  10:17 AM

## 2019-05-29 ENCOUNTER — Inpatient Hospital Stay (HOSPITAL_COMMUNITY): Payer: Medicare Other

## 2019-05-29 DIAGNOSIS — I5043 Acute on chronic combined systolic (congestive) and diastolic (congestive) heart failure: Secondary | ICD-10-CM

## 2019-05-29 DIAGNOSIS — Z515 Encounter for palliative care: Secondary | ICD-10-CM

## 2019-05-29 DIAGNOSIS — N1831 Chronic kidney disease, stage 3a: Secondary | ICD-10-CM

## 2019-05-29 LAB — BLOOD GAS, VENOUS
Acid-Base Excess: 2.2 mmol/L — ABNORMAL HIGH (ref 0.0–2.0)
Bicarbonate: 26 mmol/L (ref 20.0–28.0)
FIO2: 21
O2 Saturation: 63 %
Patient temperature: 36.2
pCO2, Ven: 38.7 mmHg — ABNORMAL LOW (ref 44.0–60.0)
pH, Ven: 7.443 — ABNORMAL HIGH (ref 7.250–7.430)

## 2019-05-29 LAB — BASIC METABOLIC PANEL
Anion gap: 13 (ref 5–15)
BUN: 58 mg/dL — ABNORMAL HIGH (ref 8–23)
CO2: 23 mmol/L (ref 22–32)
Calcium: 7.9 mg/dL — ABNORMAL LOW (ref 8.9–10.3)
Chloride: 88 mmol/L — ABNORMAL LOW (ref 98–111)
Creatinine, Ser: 2.94 mg/dL — ABNORMAL HIGH (ref 0.44–1.00)
GFR calc Af Amer: 18 mL/min — ABNORMAL LOW (ref >60)
GFR calc non Af Amer: 16 mL/min — ABNORMAL LOW (ref >60)
Glucose, Bld: 128 mg/dL — ABNORMAL HIGH (ref 70–99)
Potassium: 3.1 mmol/L — ABNORMAL LOW (ref 3.5–5.1)
Sodium: 124 mmol/L — ABNORMAL LOW (ref 135–145)

## 2019-05-29 LAB — CBC
HCT: 28.7 % — ABNORMAL LOW (ref 36.0–46.0)
Hemoglobin: 10.3 g/dL — ABNORMAL LOW (ref 12.0–15.0)
MCH: 24.9 pg — ABNORMAL LOW (ref 26.0–34.0)
MCHC: 35.9 g/dL (ref 30.0–36.0)
MCV: 69.5 fL — ABNORMAL LOW (ref 80.0–100.0)
Platelets: 175 10*3/uL (ref 150–400)
RBC: 4.13 MIL/uL (ref 3.87–5.11)
RDW: 19.9 % — ABNORMAL HIGH (ref 11.5–15.5)
WBC: 19.9 10*3/uL — ABNORMAL HIGH (ref 4.0–10.5)
nRBC: 0.2 % (ref 0.0–0.2)

## 2019-05-29 LAB — AMMONIA: Ammonia: 20 umol/L (ref 9–35)

## 2019-05-29 LAB — COOXEMETRY PANEL
Carboxyhemoglobin: 1.1 % (ref 0.5–1.5)
Methemoglobin: 0.5 % (ref 0.0–1.5)
O2 Saturation: 60.9 %
Total hemoglobin: 10.5 g/dL — ABNORMAL LOW (ref 12.0–16.0)

## 2019-05-29 LAB — LACTIC ACID, PLASMA
Lactic Acid, Venous: 0.9 mmol/L (ref 0.5–1.9)
Lactic Acid, Venous: 1.3 mmol/L (ref 0.5–1.9)

## 2019-05-29 LAB — PROCALCITONIN: Procalcitonin: 1.19 ng/mL

## 2019-05-29 LAB — MAGNESIUM: Magnesium: 2.2 mg/dL (ref 1.7–2.4)

## 2019-05-29 MED ORDER — ISOSORB DINITRATE-HYDRALAZINE 20-37.5 MG PO TABS
1.0000 | ORAL_TABLET | Freq: Three times a day (TID) | ORAL | Status: DC
  Administered 2019-05-29 – 2019-06-03 (×15): 1 via ORAL
  Filled 2019-05-29 (×15): qty 1

## 2019-05-29 MED ORDER — SENNA 8.6 MG PO TABS
1.0000 | ORAL_TABLET | Freq: Every day | ORAL | Status: DC
  Administered 2019-05-30 – 2019-06-03 (×4): 8.6 mg via ORAL
  Filled 2019-05-29 (×4): qty 1

## 2019-05-29 MED ORDER — POTASSIUM CHLORIDE CRYS ER 20 MEQ PO TBCR
40.0000 meq | EXTENDED_RELEASE_TABLET | Freq: Once | ORAL | Status: AC
  Administered 2019-05-29: 40 meq via ORAL
  Filled 2019-05-29: qty 2

## 2019-05-29 NOTE — Progress Notes (Signed)
Family Medicine Teaching Service Daily Progress Note Intern Pager: [phone]  Patient name: Leah [name] [facility] Medical record number: [id number] Date of birth: January 20, 1951 Age: 69 y.o. Gender: female  Primary Care Provider: Guadalupe Dawn, MD Consultants: Heart Failure Code Status: Full   Pt Overview and Major Events to Date:  2/23 admitted in cardiogenic shock to ICU 2/28 transferred to floor   Assessment and Plan: Leah [name] a 69 y.o.femalewho presented with altered mental status, found to be in cardiogenic shock 2/2 viral cardiomyopathy. PMH is significant for CAD, HTN, HLD, PVD, HFpEF (now HFrEF EF 10%), CKD, MV replacement, Hx bronchogenic lung cancer, and tobacco use.  Cardiogenic Shock with biventricular dysfunction, secondary to acute viral myocarditis Has been monitored in ICU and treated by Dr. Haroldine [name].  Transferred to floor on 2/28.  Currently on milrinone gtt 0.0.25 mcg/kg/min.  Patient is lethargic this morning.  Lower extremity edema mildly increased from yesterday.  Weight is stable from yesterday. - f/u HF, appreciate recs - cont bidil 2 tablets TID  - cont spiro 12.5 mg QD - cont milrinone at 0.25 -Holding losartan given elevated creatinine -Holding digoxin given elevated creatinine -IV Lasix yesterday but holding at this time due to creatinine trending up -Elevate legs when possible  -patient has as needed Tylenol for pain  AMS  Leukocytosis On evaluation this morning patient had been sleeping but awoke easily.  Per chart, when she was evaluated by other providers she was lethargic compared to baseline.  CBC this morning showed an elevated WBC of 19.2<17.6 <14.4<11.5.  Lactic acid stable at 1.3<1.2.  Procalcitonin 1.19<1.02.  Urinalysis positive for large leukocytes as well as many bacteria.  Consistent with UTI. -Blood cultures and urine culture ordered -Cefepime per pharmacy -Vancomycin per pharmacy -Chest x-ray UA -TBG -Repeat lactic  acid -Ammonia level  CAD S/P CABG with LIMA to LAD and MVR (bioprosthetic).  Trop 761>607>371.  Home medications include Bildil, Lasix, Nitrostat, Metoprolol, ASA, Plavix.  Consider could be contributing to cardiomyopathy. - cont plan per above - cont ASA, Plavix - cont lipitor 80mg , zetia 10 mg - holding BB in setting of cardiogenic shock, on amio instead - holding lasix, now on spiro -Patient is going to receive 1 dose of Lasix today due to mild increase in shortness of breath as well as edema in lower extremities  Moderate to severe AI Blood cultures no growth at 5 days. - cont cards plan per above  Goals of care Dr. Haroldine [name] reports that he has had conversations with family in regards to palliative care and they continue to want full scope care.  Overall, patient will need aggressive rehab and nutrition assistance.  Lives with her daughter. - PT/OT: recommend SNF, will speak with family regarding plan - nutrition consult - s/p palliative discussions, patient is now DNR but continue with aggressive care  Lung cancer s/p resection Currently on 3 liters per [place].  No home O2. - stable, cardiac plan per above  Severe PAD  S/p left femoropopliteal and L CEA - bilateral RAS s/p stenting - stable - cont statin and zetia per above  AKI On CKD: Worsening Baseline appears to be 1.0-1.3.  2/2 cardiogenic shock.  Cr 2.33>>2.94.  Worsening after Lasix dose 2 days ago. - cont milrinone per above -Avoid nephrotoxic agents  AMS Initial problem on presentation.  2/2 cardiogenic shock.  Patient was mildly lethargic on second evaluation yesterday.  This morning patient is difficult to arouse.  She would fall asleep while speaking or after  asked questions.  She was oriented to person and place but not time.  This is altered in comparison to yesterday.  Of note, patient had fall overnight while trying to go to the bathroom.  Patient denied any head injury or pain anywhere.  On evaluation after  the fall patient was alert and oriented to person, place, year. - cont to monitor for changes in mental status  Hypervolemic hyponatremia 136>>125>128.  Likely 2/2 HF. - cont spironolactone - monitor fluid status -1500 mL fluid restriction -If sodium continues to drop consider tolvaptan  Frequent PVCs Improving with amiodarone.  Goal to keep K greater than 4 and mag greater than 2.  K 3.6, Mag 1.7. - replete K 40 mEq x1 - replete Mag sulfate 2g x1 - monitor BMP and Mag daily -Continue p.o. amiodarone 200 mg twice daily -Avoid beta-blocker secondary to shock  Iron Deficiency Anemia  Iron 20, Sat 8.  Hgb 10.7, MCV 1.9.  Ferritin 120.  On admission Hgb was 14.6.  Last colonoscopy 2018 by Dr. Havery Moros with tubular adenoma and hyperplastic polyp removed.  Also noted diverticulosis.  Due for repeat colonoscopy in March 2021.  Denies any hematochezia or melena. - start ferrous sulfate QOD - outpatient colonoscopy - transfusion threshold 8 given CAD   FEN/GI: Dysphagia 3 Diet PPx: Lovenox  Disposition:  planning for SNF when stable from cardiac standpoint  Subjective:  Patient had a fall last night around 4 AM.  She was trying to get up to use the restroom.  She had a bowel movement while on the floor.  She was evaluated by primary team and was found to have no injuries and reported no pain.  She reportedly did not hit her head.  Patient is lethargic this morning.  She intermittently falls asleep during evaluation.  When asked if she knows where she is she says the hospital.  When asked the the month and year she stated April and fell asleep.  I woke her up and asked her what the year was and she said 2020.  Objective: Temp:  [97.3 F (36.3 C)-98.9 F (37.2 C)] 97.3 F (36.3 C) (03/06 0419) Pulse Rate:  [60-103] 101 (03/06 0419) Resp:  [9-22] 22 (03/06 0419) BP: (97-121)/(46-63) 118/56 (03/06 0419) SpO2:  [96 %-98 %] 98 % (03/06 0419) Weight:  [64.5 kg] 64.5 kg (03/06  0349)  Physical Exam:  General: Patient is sitting in bed wrapped up when I enter the room.  Difficult to arouse and intermittently falls asleep Cardio: Regular rate in the 90s, irregular, frequent PVCs, systolic murmur at RUSB Lungs: Clear to auscultation bilaterally, no crackles or wheezes noted.  Normal work of breathing Skin: warm and dry  Extremities: +2 bilateral lower extremity edema.  Edema is to mid shin level. Neuro: Difficult to arouse.  Moves all extremities   Laboratory: Recent Labs  Lab 05/27/19 0500 05/28/19 0719 05/29/19 0413  WBC 14.4* 17.6* 19.9*  HGB 10.8* 10.7* 10.3*  HCT 29.9* 29.8* 28.7*  PLT 143* 174 175   Recent Labs  Lab 05/27/19 0500 05/28/19 0719 05/29/19 0413  NA 128* 125* 124*  K 3.6 3.2* 3.1*  CL 92* 88* 88*  CO2 23 23 23   BUN 47* 52* 58*  CREATININE 2.33* 2.56* 2.94*  CALCIUM 8.3* 8.1* 7.9*  GLUCOSE 92 137* 128*     Imaging/Diagnostic Tests: No results found.  Gifford Shave, MD 05/29/2019, 6:23 AM PGY-1, [place] Intern pager: [phone], text pages welcome

## 2019-05-29 NOTE — Progress Notes (Signed)
RN was exiting another pts room when RN noticed this pt to be off the tele monitor. RN went to pts room and found pt lying in the floor. Pt was assessed and noted to have bowel movement while in the floor. Multiple staff members assisted in getting pt cleaned up and back to bed. Pt VS were taken and stable. Pt had no complaints. Md notified and came to bedside to assess pt.  Pt resting comfortably with bed alarm on. Will continue to monitor.

## 2019-05-29 NOTE — TOC Progression Note (Signed)
Transition of Care [facility]) - Progression Note    Patient Details  Name: Leah [name] MRN: [id number] Date of Birth: [birth year]  Transition of Care [facility] CM/SW [place], [place] Phone Number: 05/29/2019, 9:56 AM  Clinical Narrative:     CSW called and spoke with Janie at [facility]. She confirmed that she is able to take the patient on the millrione drip. She stated that discharge would have to be coordinated and they couldn't accommodate her this weekend. Janie asked the CSW call back on Monday to coordinate discharge with milrinone drip.   CSW will continue to follow and call Janie on Monday to coordinate discharge.   Expected Discharge Plan: [place] Barriers to Discharge: Continued Medical Work up  Expected Discharge Plan and Services Expected Discharge Plan: [place] In-house Referral: NA Discharge Planning Services: CM Consult Post Acute Care Choice: [place] Living arrangements for the past 2 months: Single Family Home                           HH Arranged: RN, Disease Management, PT [place] Agency: [place] ([facility]) Date HH Agency Contacted: 05/21/19 Time [place]: 1502 Representative spoke with at [place]: [place] ([place]) Interventions    Readmission Risk Interventions Readmission Risk Prevention Plan 05/21/2019  Transportation Screening Complete  PCP or Specialist Appt within 3-5 Days Complete  HRI or [place] Complete  Social Work Consult for [place] Planning/Counseling Complete  Palliative Care Screening Not Applicable  Medication Review Press photographer) Complete  Some recent data might be hidden

## 2019-05-29 NOTE — Progress Notes (Addendum)
FPTS Interim Progress Note  S: Received page that patient had fallen out of bed and was found down on her right side and had had a bowel movement while on the floor. Nursing staff helped patient back to bed and she was able to move safely back to bed without difficulty. I reported to the bedside, patient reports that she had to go to the restroom and tried to get out of bed.  She denies hitting her head or any injuries or pain. Leah [name] does report that her stomach was hurting while pointing to her left side which is why she needed to go to the restroom.  I inquired multiple times if the patient was in pain to which she responded no.  Patient alert and oriented to self, location and year.  Patient responds to questions appropriately.  O: BP (!) 118/56   Pulse (!) 101   Temp (!) 97.3 F (36.3 C) (Oral)   Resp (!) 22   Ht 6\' 1"  (1.854 m)   Wt 64.5 kg   SpO2 98%   BMI 18.76 kg/m   General: Cachectic appearing female lying in bed supine in no acute distress Abdomen: Soft, abdomen is large appearing as if patient has ascites, bowel sounds heard throughout, mild tenderness on left upper and lower quadrant   A/P: Fall out of bed Patient reports attempting to get out of bed independently in order to use the restroom.  Patient found by nursing staff on her right lateral side.  Patient denies any injury to her head and has no pain upon my interview.  Patient states that she had Tylenol and feels well.  I encourage patient to remain in bed or call for assistance if she needs to get out of bed or use the restroom in the future.  Patient verbalized understanding. -Patient with no head injury so will not order imaging at this time   Stark Klein, MD 05/29/2019, 4:24 AM PGY-1 [place] Medicine Service pager [phone]

## 2019-05-29 NOTE — Progress Notes (Signed)
Advanced Heart Failure Rounding Note  PCP-Cardiologist: Peter Martinique, MD   Subjective:    CMRI-Findings consistent with acute myocarditis. LVEF 11% RVEF 18% (severe biventricular dysfunction)  Found to have a UTI.  Now on cefepime and vancomycin.  Urine culture not available.  Procalcitonin 1.19.  Lactic acid 0.9.  Had a fall last night while trying to go to the bathroom.  Remains on milrinone 0.25.  Coox 61%. CVP 2-3   Remains weak. Poor appetite. More alert today. Denies CP or SOB.   Creatinine continues to climb.  2.94 today.  Objective:   Weight Range: 64.5 kg Body mass index is 18.76 kg/m.   Vital Signs:   Temp:  [97.2 F (36.2 C)-98.9 F (37.2 C)] 97.2 F (36.2 C) (03/06 1315) Pulse Rate:  [60-103] 97 (03/06 1000) Resp:  [9-22] 19 (03/06 1000) BP: (97-121)/(46-63) 120/61 (03/06 1000) SpO2:  [96 %-98 %] 96 % (03/06 1000) Weight:  [64.5 kg] 64.5 kg (03/06 0349) Last BM Date: (pt unable to confirm)  Weight change: Filed Weights   05/27/19 0348 05/28/19 0350 05/29/19 0349  Weight: 63.6 kg 64.6 kg 64.5 kg    Intake/Output:   Intake/Output Summary (Last 24 hours) at 05/29/2019 1504 Last data filed at 05/29/2019 1100 Gross per 24 hour  Intake 406.05 ml  Output --  Net 406.05 ml      Physical Exam   General:  Thin cachetic AAF. Looks much older than actual age No resp difficulty  HEENT: normal MMs dry Neck: supple. no JVD. Carotids 2+ bilat; no bruits. No lymphadenopathy or thryomegaly appreciated. Cor: PMI laterally displaced. Regular rate & rhythm. +s3 Lungs: clear Abdomen: soft, nontender, nondistended. No hepatosplenomegaly. No bruits or masses. Good bowel sounds. Extremities: no cyanosis, clubbing, rash, edema cachetic Neuro: alert & orientedx3, cranial nerves grossly intact. moves all 4 extremities w/o difficulty. Affect pleasant  Telemetry   Sinus Tach 90s w/ frequent PVCs Personally reviewed   Labs    CBC Recent Labs    05/28/19 0719  05/29/19 0413  WBC 17.6* 19.9*  HGB 10.7* 10.3*  HCT 29.8* 28.7*  MCV 70.1* 69.5*  PLT 174 188   Basic Metabolic Panel Recent Labs    05/28/19 0719 05/29/19 0413  NA 125* 124*  K 3.2* 3.1*  CL 88* 88*  CO2 23 23  GLUCOSE 137* 128*  BUN 52* 58*  CREATININE 2.56* 2.94*  CALCIUM 8.1* 7.9*  MG 2.3 2.2   Liver Function Tests No results for input(s): AST, ALT, ALKPHOS, BILITOT, PROT, ALBUMIN in the last 72 hours. No results for input(s): LIPASE, AMYLASE in the last 72 hours. Cardiac Enzymes No results for input(s): CKTOTAL, CKMB, CKMBINDEX, TROPONINI in the last 72 hours.  BNP: BNP (last 3 results) Recent Labs    05/18/19 1707 05/21/19 1206  BNP >4,500.0* 1,490.6*    ProBNP (last 3 results) No results for input(s): PROBNP in the last 8760 hours.   D-Dimer No results for input(s): DDIMER in the last 72 hours. Hemoglobin A1C No results for input(s): HGBA1C in the last 72 hours. Fasting Lipid Panel No results for input(s): CHOL, HDL, LDLCALC, TRIG, CHOLHDL, LDLDIRECT in the last 72 hours. Thyroid Function Tests No results for input(s): TSH, T4TOTAL, T3FREE, THYROIDAB in the last 72 hours.  Invalid input(s): FREET3  Other results:   Imaging    DG CHEST PORT 1 VIEW  Result Date: 05/29/2019 CLINICAL DATA:  Shortness of breath, sepsis. EXAM: PORTABLE CHEST 1 VIEW COMPARISON:  Chest radiograph 05/19/2019  FINDINGS: Interval removal of a Swan-Ganz catheter. There has been placement of a left upper extremity PICC with tip projecting at the cavoatrial junction. Stable cardiomediastinal contours with enlarged heart size status post median sternotomy and valvular replacement. Chronic bilateral coarse interstitial markings consistent with scarring. Surgical changes are noted in the right upper lung. No new focal pulmonary opacity. No pneumothorax or large pleural effusion. No acute finding in the visualized skeleton. IMPRESSION: 1.  No acute cardiopulmonary finding. 2. Left  upper extremity PICC tip projects at the cavoatrial junction. Electronically Signed   By: Audie Pinto M.D.   On: 05/29/2019 13:57     Medications:     Scheduled Medications: . amiodarone  200 mg Oral BID  . aspirin  81 mg Oral Daily  . atorvastatin  80 mg Oral Daily  . carbamide peroxide  5 drop Left EAR q morning - 10a  . Chlorhexidine Gluconate Cloth  6 each Topical Daily  . clopidogrel  75 mg Oral Daily  . enoxaparin (LOVENOX) injection  30 mg Subcutaneous Q24H  . ezetimibe  10 mg Oral Daily  . feeding supplement (ENSURE ENLIVE)  237 mL Oral TID BM  . fluticasone  1 puff Inhalation Daily  . gabapentin  200 mg Oral TID  . isosorbide-hydrALAZINE  2 tablet Oral TID  . multivitamin with minerals  1 tablet Oral Daily  . [START ON 05/30/2019] senna  1 tablet Oral Daily  . spironolactone  12.5 mg Oral Daily    Infusions: . ceFEPime (MAXIPIME) IV 2 g (05/29/19 1147)  . ferumoxytol Stopped (05/24/19 1314)  . milrinone 0.25 mcg/kg/min (05/29/19 1100)  . vancomycin 1,000 mg (05/28/19 1431)    PRN Medications: acetaminophen, albuterol, cyclobenzaprine, ondansetron (ZOFRAN) IV, polyethylene glycol, sorbitol    Assessment/Plan   1. Cardiogenic shock with sever biventricular dysfunction - Previous EF 40% in 10/18 - Echo in ER EF 10% with severe RV dysfunction. Moderate to severe AI - cMRI - Findings consistent with acute myocarditis. LVEF 11% RV EF 18%  - initial lactate > 6.7 - initial swan #s w/ low output and elevated filling pressures: CO/CI 1.49/0.84, RA 16, PCWP 26, PAPi 0.74. Co-ox 39% - Suspect PVCs playing a role.  Continue po amio to suppress PVCs.  - cMRI - Findings consistent with acute myocarditis. LVEF 11% RV reduced., Severe Biventricular Failure. - CO-OX 61% on milrinone 0.25 mcg.  Will cotninue - CVP 2-3 range. Holding diuretics. Increase po intake. May need gentle IVFs  - Cut bidil to 1 tab tid to let BP rise to help with AKI - hold spiro with aki and  volume depletion   - No dig with elevated creatinine.  - Hold off on losartan with elevated creatinine.  - not candidate for mechanical support with PAD and severe AI - Palliative Care discussions ongoing. Per Dr Haroldine Laws --- I think she wil weather the acute storm better than I thought she might but now will have HF on top of her other chronic issues and not sure how good we can get her QOL. I feel we should continue to be aggressive for now but I feel setting limits with DNR/DNI is probably appropriate as heroic measure would not likely lead to a good outcome in someone this ill.     2. CAD - s/p CABG with LIMA to LAD and MVR (bioprosthetic) - Trop 407 -> 387->492.  - No CP. Not ACS - continue asa, Plavix + statin   3. Moderate to severe AI -  consider TEE - BCx NGTD  4. Lung cancer s/p resection - stable  5. Severe PAD - s/p left fem-pop and L CEA - bilateral RAS s/p stenting - stable   6. AKI - recurrent. Suspect ATN from low BP with infection and HF - Creatinine up to 2.94. hold diuretics and spiro - Continue milrinone.  - not candidate for HD  7. AMS - due to shock. More alert otday  8. Hypokalemia/hyponatremia:  - K 3.1 - Sodium up to 124.  - Restrict free water.  - supp K  9. PVCs - PVCs suppressed. Continue amio 200 mg twice a day.   -Keep K> 4. Mg > 2.0 - no b-blocker with shock   10. Urosepsis - now on vanc/meropenem - WBC 19k  11. DNR/DNI   Remains very tenuous. Continue milrinone. Hold diuretics.Treat infection. Hopefully renal function improves soon.    Length of Stay: 51  Glori Bickers, MD  05/29/2019, 3:04 PM  Advanced Heart Failure Team Pager [phone] (M-F; 7a - 4p)  Please contact [facility] for night-coverage after hours (4p -7a ) and weekends on [email or web address]  Patient seen and examined with the above-signed Advanced Practice Provider and/or Housestaff. I personally reviewed laboratory data, imaging studies and relevant notes.  I independently examined the patient and formulated the important aspects of the plan. I have edited the note to reflect any of my changes or salient points. I have personally discussed the plan with the patient and/or family.  Remains on milrinone. Co-ox improved but looks much worse today. AF but rising WBCs and AKI. More lethargic. CVP 5  Agree with suspicion for developing sepsis. Get cx, lactate and PCT. Start empiric abx until cx data back. Stop diuretics.   Very tenuous. Prognosis guarded.   Glori Bickers, MD  3:04 PM

## 2019-05-29 NOTE — Plan of Care (Signed)

## 2019-05-29 NOTE — Progress Notes (Signed)
Daily Progress Note   Patient Name: Leah [name]       Date: 05/29/2019 DOB: [birth year]  Age: 69 y.o. MRN#: [id number] Attending Physician: Leah Artist, MD Primary Care Physician: Leah Dawn, MD Admit Date: 05/18/2019  Reason for Consultation/Follow-up: Establishing goals of care and Psychosocial/spiritual support  Subjective: Visited with patient at bedside.  She is somewhat lethargic but awake and alert.  She asks for coca-cola.  No complaints.  Spoke with Leah [name] on the phone.  Explained that she is comfortable, but that I am concerned about the trend of her kidney function.  We briefly discussed that the combination of heart failure and kidney failure is a difficult one to manage.  I shared Leah [name]'s conversation with Leah [name] that she does not want hemodialysis if her kidneys continue to decline.  Leah [name] understood that as well.  Assessment: Patient with acute myocarditis and severe biventricular dysfunction.  UTI 3/5.  Kidney function trending poorly.  Hyponatremic.  PO intake poor.  Hopefully if infection is corrected some of this can turn around.  Patient Profile/HPI 69 y.o.femalewith past medical history of systolic CHF (EF 85% 9292), CAD s/p stenting 2016, mitral valve replacement 2016, HTN, HLD, COPD, tobacco use, lung adenocarcinoma stage 1 s/p resection 2018, PVD/PADadmitted on2/23/2021with cardiogenic shock and EF decreased to 10%, severe RV dysfunction, severe aortic insufficiency as well withlikelyacute viral myocarditis heart failure team.Required ICU admission with milrinone and bicarb infusion.Progressed to step down status.    Length of Stay: 11  Current Medications: Scheduled Meds:  . amiodarone  200 mg Oral BID  . aspirin  81 mg  Oral Daily  . atorvastatin  80 mg Oral Daily  . carbamide peroxide  5 drop Left EAR q morning - 10a  . Chlorhexidine Gluconate Cloth  6 each Topical Daily  . clopidogrel  75 mg Oral Daily  . enoxaparin (LOVENOX) injection  30 mg Subcutaneous Q24H  . ezetimibe  10 mg Oral Daily  . feeding supplement (ENSURE ENLIVE)  237 mL Oral TID BM  . fluticasone  1 puff Inhalation Daily  . gabapentin  200 mg Oral TID  . isosorbide-hydrALAZINE  2 tablet Oral TID  . multivitamin with minerals  1 tablet Oral Daily  . senna  2 tablet Oral Daily  . spironolactone  12.5 mg Oral Daily  Continuous Infusions: . ceFEPime (MAXIPIME) IV 2 g (05/28/19 1345)  . ferumoxytol Stopped (05/24/19 1314)  . milrinone 0.25 mcg/kg/min (05/29/19 0300)  . vancomycin 1,000 mg (05/28/19 1431)    PRN Meds: acetaminophen, albuterol, cyclobenzaprine, ondansetron (ZOFRAN) IV, polyethylene glycol, sorbitol  Physical Exam        Very pleasant elderly female, awake and alert, but somewhat lethargic CV irregular with murmur CV no distress, no frank w/c/r Abdomen soft, nt, nd  Vital Signs: BP (!) 108/57 (BP Location: Right Arm)   Pulse (!) 102   Temp (!) 97.2 F (36.2 C) (Oral)   Resp 20   Ht 6\' 1"  (1.854 m)   Wt 64.5 kg   SpO2 96%   BMI 18.76 kg/m  SpO2: SpO2: 96 % O2 Device: O2 Device: Room Air O2 Flow Rate: O2 Flow Rate (L/min): 3 L/min  Intake/output summary:   Intake/Output Summary (Last 24 hours) at 05/29/2019 1036 Last data filed at 05/29/2019 0300 Gross per 24 hour  Intake 768.02 ml  Output 900 ml  Net -131.98 ml   LBM: Last BM Date: (pt unable to confirm) Baseline Weight: Weight: 67.4 kg Most recent weight: Weight: 64.5 kg        Palliative Assessment/Data:  30%    Flowsheet Rows     Most Recent Value  Intake Tab  Referral Department  Cardiology  Unit at Time of Referral  ICU  Palliative Care Primary Diagnosis  Cardiac  Date Notified  05/20/19  Palliative Care Type  New Palliative care    Reason for referral  Clarify Goals of Care  Date of Admission  05/18/19  Date first seen by Palliative Care  05/21/19  # of days Palliative referral response time  1 Day(s)  # of days IP prior to Palliative referral  2  Clinical Assessment  Psychosocial & Spiritual Assessment  Palliative Care Outcomes      Patient Active Problem List   Diagnosis Date Noted  . NSTEMI (non-ST elevated myocardial infarction) ([place])   . Palliative care by specialist   . Protein-calorie malnutrition, severe 05/19/2019  . AMS (altered mental status) 05/18/2019  . Cardiogenic shock ([place]) 05/18/2019  . AKI (acute kidney injury) ([place])   . Shortness of breath 04/15/2019  . Thoracic aortic aneurysm without rupture ([place]) 09/03/2018  . Viral upper respiratory illness 03/16/2018  . Hypokalemia 02/12/2018  . Claudication ([place]) 11/21/2017  . Seasonal allergies 07/28/2017  . Bilateral sciatica 07/02/2017  . Bilateral hearing loss due to cerumen impaction 07/02/2017  . CHF (congestive heart failure) ([place]) 06/05/2017  . Bronchogenic lung cancer, right ([place]) 11/29/2016  . Dysphagia 08/26/2016  . Health care maintenance 04/17/2016  . Dizziness 04/17/2016  . Decreased visual acuity 01/17/2016  . Depressed mood 07/27/2015  . S/P MVR (mitral valve replacement) 12/05/2014  . Moderate aortic regurgitation 10/26/2014  . Renal vascular disease 10/26/2014  . Acute on chronic diastolic congestive heart failure ([place]) 10/21/2014  . Tobacco use 10/21/2014  . Diastolic dysfunction, grade 2 by echo June 2016 10/21/2014  . Carpal tunnel syndrome 10/17/2014  . Severe mitral regurgitation 08/25/2014  . Unstable angina ([place]) 08/22/2014  . Low back pain 07/05/2012  . PVC (premature ventricular contraction) 10/02/2011  . History of angioedema with ACE 2013 06/14/2011  . PVD- s/p multiple proceedures 06/04/2011  . Hyperlipidemia 09/28/2008  . Essential hypertension, benign 09/28/2008  . CAD S/P LAD DES 2009 with 70% ISR  08/24/14 09/28/2008    Palliative Care Plan    Recommendations/Plan:  PMT  will continue to follow daily.  I will ask my colleague Leah Lea, NP to check in tomorrow.  Hopeful for improvement with antibiotics to treat infection.  Goals of Care and Additional Recommendations:  Limitations on Scope of Treatment: No Hemodialysis  Code Status:  DNR  Prognosis:  Concerning.  Given current downward trend she likely has only weeks.  I'm hopeful some of this is reversible.  Discharge Planning:  To Be Determined  Care plan was discussed with Son Leah [name] - Primary family contact.  Thank you for allowing the Palliative Medicine Team to assist in the care of this patient.  Total time spent:  25 min.     Greater than 50%  of this time was spent counseling and coordinating care related to the above assessment and plan.  Florentina Jenny, PA-C Palliative Medicine  Please contact Palliative MedicineTeam phone at [phone] for questions and concerns between 7 am - 7 pm.   Please see AMION for individual provider pager numbers.

## 2019-05-29 NOTE — Progress Notes (Signed)
Rn attempted to notify pts son X 2 and pts daughter X 1 of pts fall. No answer with each attempt.

## 2019-05-30 DIAGNOSIS — R64 Cachexia: Secondary | ICD-10-CM

## 2019-05-30 LAB — PROCALCITONIN: Procalcitonin: 1.02 ng/mL

## 2019-05-30 LAB — BASIC METABOLIC PANEL
Anion gap: 12 (ref 5–15)
BUN: 59 mg/dL — ABNORMAL HIGH (ref 8–23)
CO2: 23 mmol/L (ref 22–32)
Calcium: 8.1 mg/dL — ABNORMAL LOW (ref 8.9–10.3)
Chloride: 91 mmol/L — ABNORMAL LOW (ref 98–111)
Creatinine, Ser: 2.78 mg/dL — ABNORMAL HIGH (ref 0.44–1.00)
GFR calc Af Amer: 20 mL/min — ABNORMAL LOW (ref >60)
GFR calc non Af Amer: 17 mL/min — ABNORMAL LOW (ref >60)
Glucose, Bld: 89 mg/dL (ref 70–99)
Potassium: 3.2 mmol/L — ABNORMAL LOW (ref 3.5–5.1)
Sodium: 126 mmol/L — ABNORMAL LOW (ref 135–145)

## 2019-05-30 LAB — CBC
HCT: 27.7 % — ABNORMAL LOW (ref 36.0–46.0)
Hemoglobin: 10 g/dL — ABNORMAL LOW (ref 12.0–15.0)
MCH: 24.9 pg — ABNORMAL LOW (ref 26.0–34.0)
MCHC: 36.1 g/dL — ABNORMAL HIGH (ref 30.0–36.0)
MCV: 68.9 fL — ABNORMAL LOW (ref 80.0–100.0)
Platelets: 177 10*3/uL (ref 150–400)
RBC: 4.02 MIL/uL (ref 3.87–5.11)
RDW: 20.1 % — ABNORMAL HIGH (ref 11.5–15.5)
WBC: 17.4 10*3/uL — ABNORMAL HIGH (ref 4.0–10.5)
nRBC: 0 % (ref 0.0–0.2)

## 2019-05-30 LAB — COOXEMETRY PANEL
Carboxyhemoglobin: 1.4 % (ref 0.5–1.5)
Carboxyhemoglobin: 1.2 % (ref 0.5–1.5)
Methemoglobin: 0.6 % (ref 0.0–1.5)
Methemoglobin: 0.4 % (ref 0.0–1.5)
O2 Saturation: 98.6 %
O2 Saturation: 67.4 %
Total hemoglobin: 9.9 g/dL — ABNORMAL LOW (ref 12.0–16.0)
Total hemoglobin: 11.2 g/dL — ABNORMAL LOW (ref 12.0–16.0)

## 2019-05-30 LAB — MAGNESIUM: Magnesium: 2.2 mg/dL (ref 1.7–2.4)

## 2019-05-30 MED ORDER — GERHARDT'S BUTT CREAM
TOPICAL_CREAM | Freq: Every day | CUTANEOUS | Status: DC | PRN
  Administered 2019-05-30: 1 via TOPICAL
  Filled 2019-05-30 (×2): qty 1

## 2019-05-30 MED ORDER — POTASSIUM CHLORIDE CRYS ER 20 MEQ PO TBCR
40.0000 meq | EXTENDED_RELEASE_TABLET | Freq: Once | ORAL | Status: AC
  Administered 2019-05-30: 40 meq via ORAL
  Filled 2019-05-30: qty 2

## 2019-05-30 MED ORDER — POTASSIUM CHLORIDE CRYS ER 20 MEQ PO TBCR
40.0000 meq | EXTENDED_RELEASE_TABLET | Freq: Once | ORAL | Status: AC
  Administered 2019-05-30: 11:00:00 40 meq via ORAL
  Filled 2019-05-30: qty 2

## 2019-05-30 NOTE — Progress Notes (Addendum)
Family Medicine Teaching Service Daily Progress Note Intern Pager: [phone]  Patient name: Leah [name] [facility] Medical record number: [id number] Date of birth: November 14, 1950 Age: 69 y.o. Gender: female  Primary Care Provider: Guadalupe Dawn, MD Consultants: Heart Failure Code Status: Full   Pt Overview and Major Events to Date:  2/23 admitted in cardiogenic shock to ICU 2/28 transferred to floor  Assessment and Plan: Leah [name] a 69 y.o.femalewho presented with altered mental status, found to be in cardiogenic shock 2/2 viral cardiomyopathy. PMH is significant for CAD, HTN, HLD, PVD, HFpEF (now HFrEF EF 10%), CKD, MV replacement, Hx bronchogenic lung cancer, and tobacco use.  Cardiogenic Shock with biventricular dysfunction, secondary to acute viral myocarditis Currently on Milrinone gtt 0.25 mcg/kg/min. Mental status and alertness has improved significantly from 3/6 per reports. Heart failure team following appreciated recommendations. Down 2 lbs from 3/6. Fortunately with some mild cr improvement after stopping spiro. Continue fluid restriction, 1.5L. - heart failure following, appreciate their help - continue bidil 1 tab tid - continue milrinone, management per HF - hold losartan, dig, spiro - fluid restriction, 1.5L - elevate legs to help with the swelling - tylenol prn  AMS  Leukocytosis WBC improved to 17.4 from 19.9, afebrile. Treating for presumed UTI although there is no culture data yet. Large leuks and many bacteria on UA. Will continue to treat empirically and await blood culture and urine culture results. Patient was at her baseline from previous encounters I have had with her in clinic. Procal 1.2->1.02 - monitor blood and urine cultures - narrow abx as able - continue vanc per pharmacy  - continue cefepime per pharmacy  CAD S/P CABG with LIMA to LAD and MVR (bioprosthetic).  - continue dapt - continue lipitor 80mg , zetia 10mg  - continue amiodarone 200mg   bid - continue holding spiro, bb, lasix  Goals of care Mild improvement today. Palliative involved. DNR/DNI. Hopeful at recovery and has improved significantly from admission although things remain tenuous.  - palliative following appreciate recs - pt/ot/nutrition - dispo pending clinical course - dnr/dni  Lung cancer s/p resection Currently on Room air.  No home O2. - stable, cardiac plan per above  Severe PAD  S/p left femoropopliteal and L CEA - bilateral RAS s/p stenting - continue dapt - cont statin and zetia per above  AKI On CKD: Worsening Baseline appears to be 1.0-1.3.  2/2 cardiogenic shock. Cr improved slightly at 2.78 from 2.94.  Worsening after Lasix dose 2 days ago. - cont milrinone per above - Avoid nephrotoxic agents  Hypervolemic hyponatremia Na 126 from 124. Continue fluid restriction  Frequent PVCs/ Hypokalemia/ Hypomagnesemia Goal to keep K greater than 4 and mag greater than 2.  K 3.6, Mag 2.2 today. - daily bmp - replete K with kdur 40 meq x1 - replete Mag as needed - monitor BMP and Mag daily -Continue p.o. amiodarone 200 mg twice daily  Iron Deficiency Anemia  Iron 20, Sat 8.  Hgb 10., MCV 68.9.  Ferritin 120.  On admission Hgb was 14.6.  Last colonoscopy 2018 by Dr. Havery Moros with tubular adenoma and hyperplastic polyp removed.  Also noted diverticulosis.  Due for repeat colonoscopy in March 2021.  Denies any hematochezia or melena. - continue feraheme 510mg  weekly, next dose 3/8 - outpatient colonoscopy - transfusion threshold 8 given CAD  FEN/GI: Dysphagia 3 Diet PPx: Lovenox  Disposition:  planning for SNF when stable from cardiac standpoint  Subjective:  Doing well this morning. Enjoying frosted flakes during exam. Says  that she feels much better. Has been having frequent urination and bowel movements.  Objective: Temp:  [97.2 F (36.2 C)-98.1 F (36.7 C)] 97.9 F (36.6 C) (03/07 0400) Pulse Rate:  [92-103] 103 (03/07 0400) Resp:   [16-22] 19 (03/07 0400) BP: (97-125)/(54-71) 125/69 (03/07 0400) SpO2:  [94 %-98 %] 94 % (03/07 0844) Weight:  [63.7 kg] 63.7 kg (03/07 0500)  Physical Exam:  General: 69 y/o AA female, no acute distress, very pleasant Cardio: mild tachycardia, irregular rhythm, mild systolic murmur appreciated, 2+ pitting BLE  Lungs: lungs clear to auscultation bilaterally, no acute distress, comfortable Skin: warm and dry  Extremities: +2 bilateral lower extremity edema.  Edema is to mid shin level. Neuro: alert, oriented x3. No distress, no focal neuro deficit  Laboratory: Recent Labs  Lab 05/28/19 0719 05/29/19 0413 05/30/19 0500  WBC 17.6* 19.9* 17.4*  HGB 10.7* 10.3* 10.0*  HCT 29.8* 28.7* 27.7*  PLT 174 175 177   Recent Labs  Lab 05/28/19 0719 05/29/19 0413 05/30/19 0718  NA 125* 124* 126*  K 3.2* 3.1* 3.2*  CL 88* 88* 91*  CO2 23 23 23   BUN 52* 58* 59*  CREATININE 2.56* 2.94* 2.78*  CALCIUM 8.1* 7.9* 8.1*  GLUCOSE 137* 128* 89    Imaging/Diagnostic Tests: DG CHEST PORT 1 VIEW  Result Date: 05/29/2019 CLINICAL DATA:  Shortness of breath, sepsis. EXAM: PORTABLE CHEST 1 VIEW COMPARISON:  Chest radiograph 05/19/2019 FINDINGS: Interval removal of a Swan-Ganz catheter. There has been placement of a left upper extremity PICC with tip projecting at the cavoatrial junction. Stable cardiomediastinal contours with enlarged heart size status post median sternotomy and valvular replacement. Chronic bilateral coarse interstitial markings consistent with scarring. Surgical changes are noted in the right upper lung. No new focal pulmonary opacity. No pneumothorax or large pleural effusion. No acute finding in the visualized skeleton. IMPRESSION: 1.  No acute cardiopulmonary finding. 2. Left upper extremity PICC tip projects at the cavoatrial junction. Electronically Signed   By: Audie Pinto M.D.   On: 05/29/2019 13:57    Guadalupe Dawn, MD 05/30/2019, 8:47 AM PGY-3, [facility] Intern pager: [phone], text pages welcome

## 2019-05-30 NOTE — Progress Notes (Signed)
Advanced Heart Failure Rounding Note  PCP-Cardiologist: Peter Martinique, MD   Subjective:    CMRI-Findings consistent with acute myocarditis. LVEF 11% RVEF 18% (severe biventricular dysfunction)  Found to have a UTI.  Now on cefepime and vancomycin.  Urine culture not available.  Procalcitonin 1.19 Lactic acid 0.9 -> 1.3.  Remains on milrinone 0.25. Co-ox inaccurate CVP 2-3. Off diuretics Creatinine starting to improve.   Says she has had several good BMs overnight and this am. Feeling much better. More alert. Sitting in chair. Denies SOB.   Objective:   Weight Range: 63.7 kg Body mass index is 18.53 kg/m.   Vital Signs:   Temp:  [97.2 F (36.2 C)-98.1 F (36.7 C)] 97.9 F (36.6 C) (03/07 0400) Pulse Rate:  [92-103] 103 (03/07 0400) Resp:  [16-22] 19 (03/07 0400) BP: (97-125)/(54-71) 125/69 (03/07 0400) SpO2:  [96 %-98 %] 96 % (03/07 0400) Weight:  [63.7 kg] 63.7 kg (03/07 0500) Last BM Date: (pt unable to confirm)  Weight change: Filed Weights   05/28/19 0350 05/29/19 0349 05/30/19 0500  Weight: 64.6 kg 64.5 kg 63.7 kg    Intake/Output:   Intake/Output Summary (Last 24 hours) at 05/30/2019 0827 Last data filed at 05/30/2019 0745 Gross per 24 hour  Intake 936.58 ml  Output --  Net 936.58 ml      Physical Exam   General:  Thin cachetic AAF. Looks much older than actual age sitting in chair HEENT: normal Neck: supple. no JVD. Carotids 2+ bilat; no bruits. No lymphadenopathy or thryomegaly appreciated. Cor: PMI nondisplaced. Mildly iregular rate & rhythm. +s3 Lungs: clear Abdomen: soft, nontender, nondistended. No hepatosplenomegaly. No bruits or masses. Good bowel sounds. Extremities: no cyanosis, clubbing, rash, edema Neuro: alert & orientedx3, cranial nerves grossly intact. moves all 4 extremities w/o difficulty. Affect pleasant   Telemetry   Sinus 90-105s w/ frequent PVCs Personally reviewed   Labs    CBC Recent Labs    05/29/19 0413  05/30/19 0500  WBC 19.9* 17.4*  HGB 10.3* 10.0*  HCT 28.7* 27.7*  MCV 69.5* 68.9*  PLT 175 633   Basic Metabolic Panel Recent Labs    05/29/19 0413 05/30/19 0718  NA 124* 126*  K 3.1* 3.2*  CL 88* 91*  CO2 23 23  GLUCOSE 128* 89  BUN 58* 59*  CREATININE 2.94* 2.78*  CALCIUM 7.9* 8.1*  MG 2.2 2.2   Liver Function Tests No results for input(s): AST, ALT, ALKPHOS, BILITOT, PROT, ALBUMIN in the last 72 hours. No results for input(s): LIPASE, AMYLASE in the last 72 hours. Cardiac Enzymes No results for input(s): CKTOTAL, CKMB, CKMBINDEX, TROPONINI in the last 72 hours.  BNP: BNP (last 3 results) Recent Labs    05/18/19 1707 05/21/19 1206  BNP >4,500.0* 1,490.6*    ProBNP (last 3 results) No results for input(s): PROBNP in the last 8760 hours.   D-Dimer No results for input(s): DDIMER in the last 72 hours. Hemoglobin A1C No results for input(s): HGBA1C in the last 72 hours. Fasting Lipid Panel No results for input(s): CHOL, HDL, LDLCALC, TRIG, CHOLHDL, LDLDIRECT in the last 72 hours. Thyroid Function Tests No results for input(s): TSH, T4TOTAL, T3FREE, THYROIDAB in the last 72 hours.  Invalid input(s): FREET3  Other results:   Imaging    DG CHEST PORT 1 VIEW  Result Date: 05/29/2019 CLINICAL DATA:  Shortness of breath, sepsis. EXAM: PORTABLE CHEST 1 VIEW COMPARISON:  Chest radiograph 05/19/2019 FINDINGS: Interval removal of a Swan-Ganz catheter. There has  been placement of a left upper extremity PICC with tip projecting at the cavoatrial junction. Stable cardiomediastinal contours with enlarged heart size status post median sternotomy and valvular replacement. Chronic bilateral coarse interstitial markings consistent with scarring. Surgical changes are noted in the right upper lung. No new focal pulmonary opacity. No pneumothorax or large pleural effusion. No acute finding in the visualized skeleton. IMPRESSION: 1.  No acute cardiopulmonary finding. 2. Left upper  extremity PICC tip projects at the cavoatrial junction. Electronically Signed   By: Audie Pinto M.D.   On: 05/29/2019 13:57     Medications:     Scheduled Medications: . amiodarone  200 mg Oral BID  . aspirin  81 mg Oral Daily  . carbamide peroxide  5 drop Left EAR q morning - 10a  . Chlorhexidine Gluconate Cloth  6 each Topical Daily  . clopidogrel  75 mg Oral Daily  . enoxaparin (LOVENOX) injection  30 mg Subcutaneous Q24H  . feeding supplement (ENSURE ENLIVE)  237 mL Oral TID BM  . fluticasone  1 puff Inhalation Daily  . gabapentin  200 mg Oral TID  . isosorbide-hydrALAZINE  1 tablet Oral TID  . multivitamin with minerals  1 tablet Oral Daily  . potassium chloride  40 mEq Oral Once  . senna  1 tablet Oral Daily    Infusions: . ceFEPime (MAXIPIME) IV 200 mL/hr at 05/29/19 1500  . ferumoxytol Stopped (05/24/19 1314)  . milrinone 0.25 mcg/kg/min (05/29/19 1626)  . vancomycin 1,000 mg (05/28/19 1431)    PRN Medications: acetaminophen, albuterol, cyclobenzaprine, ondansetron (ZOFRAN) IV, polyethylene glycol, sorbitol    Assessment/Plan   1. Cardiogenic shock with sever biventricular dysfunction - Previous EF 40% in 10/18 - Echo in ER EF 10% with severe RV dysfunction. Moderate to severe AI - cMRI - Findings consistent with acute myocarditis. LVEF 11% RV EF 18%  - initial lactate > 6.7 - initial swan #s w/ low output and elevated filling pressures: CO/CI 1.49/0.84, RA 16, PCWP 26, PAPi 0.74. Co-ox 39% - Suspect PVCs playing a role.  Continue po amio to suppress PVCs.  - cMRI - Findings consistent with acute myocarditis. LVEF 11% RV reduced., Severe Biventricular Failure. - Remains on milrinone 0.25 mcg. Co-ox inaccurate. Will continue milrinone today. Recheck co-ox in am. - CVP 2-3 range. Continue to hold diuretics - Yesterday I decreased bidil to 1 tab tid to let BP rise to help with AKI - holding spiro with aki and volume depletion   - No dig with elevated  creatinine.  - Hold off on losartan with elevated creatinine.  - not candidate for mechanical support with PAD and severe AI - Palliative Care discussions ongoing.  2. CAD - s/p CABG with LIMA to LAD and MVR (bioprosthetic) - Trop 407 -> 387->492.  - No s/s ischemia - continue asa, Plavix + statin   3. Moderate to severe AI - consider TEE - BCx NGTD  4. Lung cancer s/p resection - stable  5. Severe PAD - s/p left fem-pop and L CEA - bilateral RAS s/p stenting - stable   6. AKI - recurrent. Suspect ATN from low BP with infection and HF - Creatinine improving 2.94 -> 2.78. Continue to hold diuretics - Continue milrinone.  - not candidate for HD  7. AMS - resolved. Likely due to UTI/shock  8. Hypokalemia/hyponatremia:  - K 3.2 - Sodium up slightly to 126 - supp K  9. PVCs - PVCs suppressed. Continue amio 200 mg twice a day.   -  Keep K> 4. Mg > 2.0 - no b-blocker with shock   10. Urosepsis - now on vanc/meropenem - WBC 19k -> 17k - apparently urine cx not sent  11. DNR/DNI     Length of Stay: [place], MD  05/30/2019, 8:27 AM  Advanced Heart Failure Team Pager [phone] (M-F; 7a - 4p)  Please contact [facility] for night-coverage after hours (4p -7a ) and weekends on [email or web address]

## 2019-05-31 DIAGNOSIS — L8990 Pressure ulcer of unspecified site, unspecified stage: Secondary | ICD-10-CM | POA: Insufficient documentation

## 2019-05-31 LAB — BASIC METABOLIC PANEL
Anion gap: 11 (ref 5–15)
BUN: 54 mg/dL — ABNORMAL HIGH (ref 8–23)
CO2: 20 mmol/L — ABNORMAL LOW (ref 22–32)
Calcium: 8.1 mg/dL — ABNORMAL LOW (ref 8.9–10.3)
Chloride: 93 mmol/L — ABNORMAL LOW (ref 98–111)
Creatinine, Ser: 2.56 mg/dL — ABNORMAL HIGH (ref 0.44–1.00)
GFR calc Af Amer: 22 mL/min — ABNORMAL LOW (ref >60)
GFR calc non Af Amer: 19 mL/min — ABNORMAL LOW (ref >60)
Glucose, Bld: 179 mg/dL — ABNORMAL HIGH (ref 70–99)
Potassium: 4 mmol/L (ref 3.5–5.1)
Sodium: 124 mmol/L — ABNORMAL LOW (ref 135–145)

## 2019-05-31 LAB — CBC
HCT: 31.4 % — ABNORMAL LOW (ref 36.0–46.0)
Hemoglobin: 10.9 g/dL — ABNORMAL LOW (ref 12.0–15.0)
MCH: 24.9 pg — ABNORMAL LOW (ref 26.0–34.0)
MCHC: 34.7 g/dL (ref 30.0–36.0)
MCV: 71.7 fL — ABNORMAL LOW (ref 80.0–100.0)
Platelets: 191 10*3/uL (ref 150–400)
RBC: 4.38 MIL/uL (ref 3.87–5.11)
RDW: 21.1 % — ABNORMAL HIGH (ref 11.5–15.5)
WBC: 16.2 10*3/uL — ABNORMAL HIGH (ref 4.0–10.5)
nRBC: 0 % (ref 0.0–0.2)

## 2019-05-31 LAB — COOXEMETRY PANEL
Carboxyhemoglobin: 1.5 % (ref 0.5–1.5)
Methemoglobin: 1.1 % (ref 0.0–1.5)
O2 Saturation: 61.1 %
Total hemoglobin: 10.8 g/dL — ABNORMAL LOW (ref 12.0–16.0)

## 2019-05-31 LAB — MAGNESIUM: Magnesium: 2.2 mg/dL (ref 1.7–2.4)

## 2019-05-31 MED ORDER — GABAPENTIN 100 MG PO CAPS
200.0000 mg | ORAL_CAPSULE | Freq: Every day | ORAL | Status: DC
  Administered 2019-06-01 – 2019-06-02 (×2): 200 mg via ORAL
  Filled 2019-05-31 (×2): qty 2

## 2019-05-31 NOTE — Progress Notes (Signed)
Nutrition Follow-up  DOCUMENTATION CODES:   Severe malnutrition in context of chronic illness  INTERVENTION:   Consider Cortrak if PO intake remains inadequate as pt is severely malnourished (if within [place]).    Continue Ensure TID  Continue MVI with minerals  Continue to encourage po intake of meals and supplements  Continue Magic cup BID with meals, each supplement provides 290 kcal and 9 grams of protein  NUTRITION DIAGNOSIS:   Severe Malnutrition related to chronic illness as evidenced by severe fat depletion, severe muscle depletion.  Ongoing  GOAL:   Patient will meet greater than or equal to 90% of their needs  Progressing  MONITOR:   PO intake, Supplement acceptance, Labs, Weight trends  REASON FOR ASSESSMENT:   Consult Diet education  ASSESSMENT:  RD working remotely.  69 yo female admitted with hypothermia and profound cardiogenic shock with severe biventricular dysfuction, ECHO in ER with EF 10%, AKI, AMS. PMH includes CAD s/p CABG with LIMA to LAD and MVR, lung cancer s/p resection, severe PAD   Pt slightly confused. Reports having a great appetite. Meal completions charted as 10-25% for her last eight meals. Drinking 1-2 Ensures daily. Encouraged PO intake and supplement compliance. May consider Cortrak as pt is malnourished and has not ate well since admit.   Admission weight: 67.4 kg  Current weight: 65 kg    Drips: milrinone Medications: MVI with minerals, senokot  Labs: Na 124 (L) Cr 2.56- trending up  Diet Order:   Diet Order            DIET DYS 3 Room service appropriate? Yes; Fluid consistency: Thin; Fluid restriction: 1500 mL Fluid  Diet effective now              EDUCATION NEEDS:   Not appropriate for education at this time  Skin:  Skin Assessment: Skin Integrity Issues: Skin Integrity Issues:: Other (Comment) Other: MASD- buttocks  Last BM:  2/22  Height:   Ht Readings from Last 1 Encounters:  05/18/19 6\' 1"  (1.854 m)     Weight:   Wt Readings from Last 1 Encounters:  05/31/19 65 kg    BMI:  Body mass index is 18.91 kg/m.  Estimated Nutritional Needs:   Kcal:  2040-2310 kcals  Protein:  100-115 g  Fluid:  >/= 2 L  Mariana Single RD, LDN Clinical Nutrition Pager listed in [place]

## 2019-05-31 NOTE — Progress Notes (Signed)
Physical Therapy Treatment Patient Details Name: Leah [name] MRN: [id number] DOB: 10-Jun-1950 Today's Date: 05/31/2019    History of Present Illness 69 y.o. 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 Pt brought to the ED for AMS, now with concern for cardiogenic shock.    PT Comments    Patient not progressing well with mobility due to fatigue, SOB and marked weakness in BLEs. Requires Min A for bed mobility and Mod A of 2 for standing. Able to take a few steps to get to chair with Min A for support with partial knee buckling. Tolerated marching in place with RW. Pt becomes SOB and anxious with mobility. VSS on RA. Encouraged there ex for strengthening while sitting in chair. Will follow.    Follow Up Recommendations  SNF;Supervision/Assistance - 24 hour     Equipment Recommendations  Rolling walker with 5" wheels    Recommendations for Other Services       Precautions / Restrictions Precautions Precautions: Fall Precaution Comments: watch HR and 02 Restrictions Weight Bearing Restrictions: No    Mobility  Bed Mobility Overal bed mobility: Needs Assistance Bed Mobility: Rolling;Sidelying to Sit Rolling: Min guard Sidelying to sit: Min assist;HOB elevated       General bed mobility comments: Cues for technique, use of rail and assist with trunk to get to EOB.  Transfers Overall transfer level: Needs assistance Equipment used: Rolling walker (2 wheeled) Transfers: Sit to/from Stand Sit to Stand: Mod assist;+2 physical assistance;From elevated surface         General transfer comment: Mod A to power to standing with use of RW, cues for hand placement/technique. Transferred to chair post ambulation  Ambulation/Gait Ambulation/Gait assistance: Min assist;+2 safety/equipment;+2 physical  assistance Gait Distance (Feet): 5 Feet Assistive device: Rolling walker (2 wheeled) Gait Pattern/deviations: Decreased stride length;Step-through pattern;Trunk flexed Gait velocity: decreased   General Gait Details: Able to take a few steps to get to chair with bil knee instability and partial buckling with RW; 2/4 DOE. VSS. Pt anxious.   Stairs             Wheelchair Mobility    Modified Rankin (Stroke Patients Only)       Balance Overall balance assessment: Needs assistance Sitting-balance support: Feet supported;No upper extremity supported Sitting balance-[name]: Fair Sitting balance - Comments: min guard to close supervision for safety    Standing balance support: During functional activity Standing balance-[name]: Poor Standing balance comment: reliant on BUE and external support. marching in place x10 with Min A due to knee instability.                            Cognition Arousal/Alertness: Awake/alert Behavior During Therapy: WFL for tasks assessed/performed Overall Cognitive Status: Impaired/Different from baseline Area of Impairment: Problem solving;Orientation;Memory;Safety/judgement                 Orientation Level: Disoriented to;Situation   Memory: Decreased short-term memory   Safety/Judgement: Decreased awareness of deficits   Problem Solving: Slow processing;Requires verbal cues        Exercises General Exercises - Lower Extremity Ankle Circles/Pumps: AROM;Both;10 reps Long Arc Quad: AROM;Both;10 reps;Seated    General Comments General comments (skin integrity, edema, etc.): VSS on RA.  Pertinent Vitals/Pain Pain Assessment: Faces Faces Pain Scale: Hurts a little bit Pain Location: bilat legs Pain Descriptors / Indicators: Aching Pain Intervention(s): Repositioned;Monitored during session;Limited activity within patient's tolerance    Home Living                      Prior Function             PT Goals (current goals can now be found in the care plan section) Progress towards PT goals: Not progressing toward goals - comment(anxiety, SOB, fatigue)    Frequency    Min 3X/week      PT Plan Current plan remains appropriate    Co-evaluation              AM-PAC PT "6 Clicks" Mobility   Outcome Measure  Help needed turning from your back to your side while in a flat bed without using bedrails?: A Little Help needed moving from lying on your back to sitting on the side of a flat bed without using bedrails?: A Little Help needed moving to and from a bed to a chair (including a wheelchair)?: A Lot Help needed standing up from a chair using your arms (e.g., wheelchair or bedside chair)?: A Lot Help needed to walk in hospital room?: A Little Help needed climbing 3-5 steps with a railing? : A Lot 6 Click Score: 15    End of Session Equipment Utilized During Treatment: Gait belt Activity Tolerance: Patient limited by fatigue(weakness, anxiety, SOB) Patient left: in chair;with call bell/phone within reach;with chair alarm set Nurse Communication: Mobility status PT Visit Diagnosis: Unsteadiness on feet (R26.81);Muscle weakness (generalized) (M62.81)     Time: 8527-7824 PT Time Calculation (min) (ACUTE ONLY): 18 min  Charges:  $Therapeutic Activity: 8-22 mins                     [name], PT, DPT Acute Rehabilitation Services Pager [phone] Office [phone]       [name] 05/31/2019, 2:15 PM

## 2019-05-31 NOTE — Progress Notes (Addendum)
Family Medicine Teaching Service Daily Progress Note Intern Pager: [phone]  Patient name: Leah [name] Medical record number: [id number] Date of birth: December 31, 1950 Age: 69 y.o. Gender: female  Primary Care Provider: Guadalupe Dawn, MD Consultants: Heart Failure Code Status: Full   Pt Overview and Major Events to Date:  2/23 admitted in cardiogenic shock to ICU 2/28 transferred to floor  Assessment and Plan: Leah [name] a 69 y.o.femalewho presented with altered mental status, found to be in cardiogenic shock 2/2 viral cardiomyopathy. PMH is significant for CAD, HTN, HLD, PVD, HFpEF (now HFrEF EF 10%), CKD, MV replacement, Hx bronchogenic lung cancer, and tobacco use.  Cardiogenic Shock with biventricular dysfunction, secondary to acute viral myocarditis Denies shortness of breath or chest pain.  ANO x 3 this morning. Mental status improving per notes on 3/6 Vital signs stable, saturating well on room air. In: Total 269ml, out: none documented Weight increased by 1.3kg in the last day  - Heart failure following, appreciate their help - continue bidil 1 tab tid - continue milrinone 0.47mcg/kg/min, management per HF, appreciate recommendations  - hold losartan, dig, spiro - fluid restriction, 1.5L - elevate legs to help with the swelling - tylenol prn  AMS  Leukocytosis WBC today 16.2 improved from 17.4>19.9, afebrile. Treating for presumed UTI although there is no culture data yet. Large leuks and many bacteria on UA. Procal 1.2->1.02. Blood culture no growth at 2 days.  Urine culture no growth at 2 days Vanc (3/5-3/8), Cefepime (3/5-) - monitor blood and urine cultures -Consider switching to p.o. antibiotics today as white count improving and afebrile -D/c Vancomycin today  -Continue cefepime per pharmacy  CAD S/P CABG with LIMA to LAD and MVR (bioprosthetic).  - continue dapt - continue lipitor 80mg , zetia 10mg  - continue amiodarone 200mg  bid - continue  holding spiro, bb, lasix  Goals of care Palliative involved. DNR/DNI. Hopeful at recovery and has improved significantly from admission although things remain tenuous.  - palliative following appreciate recs - pt/ot/nutrition - dispo pending clinical course - dnr/dni  Lung cancer s/p resection Currently on RA.  No home O2. - stable, cardiac plan per above  Severe PAD  S/p left femoropopliteal and L CEA - bilateral RAS s/p stenting - continue dapt - cont statin and zetia per above  AKI On CKD: Improving Cr 2.56 today. 2.78>2.94.   Baseline appears to be 1.0-1.3.  2/2 cardiogenic shock.Worsening after Lasix dose 2 days ago. - cont milrinone per above - Avoid nephrotoxic agents  Hypervolemic hyponatremia Na 124, [phone] -Continue fluid restriction  -Daily BMP  Frequent PVCs/ Hypokalemia/ Hypomagnesemia Goal to keep K greater than 4 and mag greater than 2.   K 4 today , K 3.6 on 3/8  Mag 2.2, 2.2 on 3/8 - daily bmp - replete Mag as needed - monitor BMP and Mag daily -Continue p.o. amiodarone 200 mg twice daily  Iron Deficiency Anemia Hgb 10.8 today, 11.2>9.9. on admission Hgb was 14.6. MCV 68.9.  Ferritin 120.  Iron 20, Sat 8.   Last colonoscopy 2018 by Dr. Havery Moros with tubular adenoma and hyperplastic polyp removed.  Also noted diverticulosis.  Due for repeat colonoscopy in March 2021.  Denies any hematochezia or melena. - continue feraheme 510mg  weekly, next dose 3/8 - outpatient colonoscopy - transfusion threshold 8 given CAD  FEN/GI: Dysphagia 3 Diet PPx: Lovenox  Post rounds addendum: reduced Gabapentin to 200mg  once at bedtime  Disposition:  planning for SNF when stable from cardiac standpoint  Subjective:  Doing well. Denies concerns this morning. Eating and drinking well. Has BM today and mobilized to the bathroom. Pleasantly confused.   Objective: Temp:  [97.5 F (36.4 C)-98.2 F (36.8 C)] 97.5 F (36.4 C) (03/08 0807) Pulse Rate:  [89-97] 89  (03/08 0807) Resp:  [13-23] 13 (03/08 0807) BP: (107-122)/(51-61) 115/51 (03/08 0807) SpO2:  [97 %-100 %] 100 % (03/08 0807) Weight:  [65 kg] 65 kg (03/08 0354)  Physical Exam:  General: Alert, pleasant 68 year old female, no acute distress Cardio: Normal S1 and S2, irregular rhythm, No murmurs or rubs.   Pulm: Clear to auscultation bilaterally, normal WOB  Abdomen: Bowel sounds normal. Abdomen soft and non-tender.  Extremities: No peripheral edema. Warm/ well perfused.  Neuro: Cranial nerves grossly intact, ANO x 3  Laboratory: Recent Labs  Lab 05/29/19 0413 05/30/19 0500 05/31/19 0610  WBC 19.9* 17.4* 16.2*  HGB 10.3* 10.0* 10.9*  HCT 28.7* 27.7* 31.4*  PLT 175 177 191   Recent Labs  Lab 05/29/19 0413 05/30/19 0718 05/31/19 0610  NA 124* 126* 124*  K 3.1* 3.2* 4.0  CL 88* 91* 93*  CO2 23 23 20*  BUN 58* 59* 54*  CREATININE 2.94* 2.78* 2.56*  CALCIUM 7.9* 8.1* 8.1*  GLUCOSE 128* 89 179*    Imaging/Diagnostic Tests: No results found.  [name], MD 05/31/2019, 9:03 AM PGY-3, [place] Intern pager: [phone], text pages welcome

## 2019-05-31 NOTE — Progress Notes (Addendum)
Advanced Heart Failure Rounding Note  PCP-Cardiologist: Peter Martinique, MD   Subjective:    CMRI-Findings consistent with acute myocarditis. LVEF 11% RVEF 18% (severe biventricular dysfunction)  Found to have a UTI.  Now on cefepime and vancomycin.  Urine culture not available.  Procalcitonin 1.19 Lactic acid 0.9 -> 1.3. AF. WBC trending down.   Remains on milrinone 0.25. Co-ox 61%. CVP 4-5. Off diuretics Creatinine starting to improve, SCr 2.94>>2.78>>2.56.   Feels tired today. Appetite is poor. Denies dyspnea. No CP     Objective:   Weight Range: 65 kg Body mass index is 18.91 kg/m.   Vital Signs:   Temp:  [97.5 F (36.4 C)-98.2 F (36.8 C)] 97.5 F (36.4 C) (03/08 0807) Pulse Rate:  [89-97] 89 (03/08 0807) Resp:  [13-23] 13 (03/08 0807) BP: (107-122)/(51-61) 115/51 (03/08 0807) SpO2:  [97 %-100 %] 100 % (03/08 0807) Weight:  [65 kg] 65 kg (03/08 0354) Last BM Date: (pt unable to confirm)  Weight change: Filed Weights   05/29/19 0349 05/30/19 0500 05/31/19 0354  Weight: 64.5 kg 63.7 kg 65 kg    Intake/Output:   Intake/Output Summary (Last 24 hours) at 05/31/2019 0924 Last data filed at 05/31/2019 0807 Gross per 24 hour  Intake 240 ml  Output --  Net 240 ml      Physical Exam   CVP 4-5 General:  Thin/cachetic AAF. Looks much older than actual age, sitting in bed HEENT: normal Neck: supple. no JVD. Carotids 2+ bilat; no bruits. No lymphadenopathy or thryomegaly appreciated. Cor: PMI nondisplaced. RRR. +s3 Lungs: clear Abdomen: soft, nontender, nondistended. No hepatosplenomegaly. No bruits or masses. Good bowel sounds. Extremities: no cyanosis, clubbing, rash, trace bilateral ankle edema Neuro: alert & orientedx3, cranial nerves grossly intact. moves all 4 extremities w/o difficulty. Affect pleasant   Telemetry   NSR 80s Personally reviewed   Labs    CBC Recent Labs    05/30/19 0500 05/31/19 0610  WBC 17.4* 16.2*  HGB 10.0* 10.9*  HCT  27.7* 31.4*  MCV 68.9* 71.7*  PLT 177 102   Basic Metabolic Panel Recent Labs    05/30/19 0718 05/31/19 0610  NA 126* 124*  K 3.2* 4.0  CL 91* 93*  CO2 23 20*  GLUCOSE 89 179*  BUN 59* 54*  CREATININE 2.78* 2.56*  CALCIUM 8.1* 8.1*  MG 2.2 2.2   Liver Function Tests No results for input(s): AST, ALT, ALKPHOS, BILITOT, PROT, ALBUMIN in the last 72 hours. No results for input(s): LIPASE, AMYLASE in the last 72 hours. Cardiac Enzymes No results for input(s): CKTOTAL, CKMB, CKMBINDEX, TROPONINI in the last 72 hours.  BNP: BNP (last 3 results) Recent Labs    05/18/19 1707 05/21/19 1206  BNP >4,500.0* 1,490.6*    ProBNP (last 3 results) No results for input(s): PROBNP in the last 8760 hours.   D-Dimer No results for input(s): DDIMER in the last 72 hours. Hemoglobin A1C No results for input(s): HGBA1C in the last 72 hours. Fasting Lipid Panel No results for input(s): CHOL, HDL, LDLCALC, TRIG, CHOLHDL, LDLDIRECT in the last 72 hours. Thyroid Function Tests No results for input(s): TSH, T4TOTAL, T3FREE, THYROIDAB in the last 72 hours.  Invalid input(s): FREET3  Other results:   Imaging    No results found.   Medications:     Scheduled Medications: . amiodarone  200 mg Oral BID  . aspirin  81 mg Oral Daily  . carbamide peroxide  5 drop Left EAR q morning - 10a  .  Chlorhexidine Gluconate Cloth  6 each Topical Daily  . clopidogrel  75 mg Oral Daily  . enoxaparin (LOVENOX) injection  30 mg Subcutaneous Q24H  . feeding supplement (ENSURE ENLIVE)  237 mL Oral TID BM  . fluticasone  1 puff Inhalation Daily  . gabapentin  200 mg Oral TID  . isosorbide-hydrALAZINE  1 tablet Oral TID  . multivitamin with minerals  1 tablet Oral Daily  . senna  1 tablet Oral Daily    Infusions: . ceFEPime (MAXIPIME) IV 2 g (05/30/19 1150)  . ferumoxytol Stopped (05/24/19 1314)  . milrinone 0.25 mcg/kg/min (05/31/19 0845)  . vancomycin 1,000 mg (05/30/19 1258)    PRN  Medications: acetaminophen, albuterol, cyclobenzaprine, Gerhardt's butt cream, ondansetron (ZOFRAN) IV, polyethylene glycol, sorbitol    Assessment/Plan   1. Cardiogenic shock with sever biventricular dysfunction - Previous EF 40% in 10/18 - Echo in ER EF 10% with severe RV dysfunction. Moderate to severe AI - cMRI - Findings consistent with acute myocarditis. LVEF 11% RV EF 18%  - initial lactate > 6.7 - initial swan #s w/ low output and elevated filling pressures: CO/CI 1.49/0.84, RA 16, PCWP 26, PAPi 0.74. Co-ox 39% - Suspect PVCs playing a role.  Continue po amio to suppress PVCs.  - cMRI - Findings consistent with acute myocarditis. LVEF 11% RV reduced., Severe Biventricular Failure. - Remains on milrinone 0.25 mcg. Co-ox 61%.  - CVP 4-5 range. Continue to hold diuretics - Continue Bidil 1 tablet tid  - holding spiro with aki and volume depletion   - No dig with elevated creatinine.  - Hold off on losartan with elevated creatinine.  - not candidate for mechanical support with PAD and severe AI - Palliative Care discussions ongoing.  2. CAD - s/p CABG with LIMA to LAD and MVR (bioprosthetic) - Trop 407 -> 387->492.  - No s/s ischemia - continue asa, Plavix + statin   3. Moderate to severe AI - consider TEE - BCx NGTD  4. Lung cancer s/p resection - stable  5. Severe PAD - s/p left fem-pop and L CEA - bilateral RAS s/p stenting - stable   6. AKI - recurrent. Suspect ATN from low BP with infection and HF - Creatinine improving 2.94 -> 2.78->2.56. Continue to hold diuretics - Continue milrinone.  - not candidate for HD  7. AMS - resolved. Likely due to UTI/shock  8. Hypokalemia/hyponatremia:  - K 4.0 - Sodium up slightly to 124. mentating ok. If continues to drop ? tolvaptan   9. PVCs - PVCs suppressed. Continue amio 200 mg twice a day.   -Keep K> 4. Mg > 2.0 - no b-blocker with shock   10. Urosepsis - now on vanc/meropenem - WBC 19k -> 17k-<16K -  apparently urine cx not sent - ? D/c vanc today  11. DNR/DNI     Length of Stay: [address] PA-C  05/31/2019, 9:24 AM  Advanced Heart Failure Team Pager [phone] (M-F; 7a - 4p)  Please contact [facility] for night-coverage after hours (4p -7a ) and weekends on [email or web address]  Patient seen and examined with the above-signed Advanced Practice Provider and/or Housestaff. I personally reviewed laboratory data, imaging studies and relevant notes. I independently examined the patient and formulated the important aspects of the plan. I have edited the note to reflect any of my changes or salient points. I have personally discussed the plan with the patient and/or family.  She looks better this afternoon. More alert but says she  is cold. No SOB. Remains on milrinone 0.25. CVP 3-5. Renal function improving.   On exam weak.  JVP flat Cor RRR Lugs clear Ab soft T Ext cachetic warm no edema  Improved today but remains inotrope dependent. I doubt we will be able to wean her off of milrinone in the immediate future but will reassess co-ox in the am and decide if we wil try to re-wean or not. SNF placement will be more difficult if milrinone on board.   Glori Bickers, MD  7:27 PM

## 2019-05-31 NOTE — Progress Notes (Addendum)
Pharmacy Antibiotic Note  Leah [name] is a 69 y.o. female admitted on 05/18/2019 with heart failure, now with had increased wbc, was febrile and overall not feeling well withincreased lactic acid 2, tachycardia 115.  Pan cultures were obtained and empiric abx were started.Pharmacy has been consulted for vancomycin and cefepime dosing.  Vital signs are now within normal limits with BP 114/44. WBC is decreasing 17.4>>16.2 and renal function is improving, Scr 2.94>>2.78>>2.56. Pct remains elevated at 1.02.  Plan: Continue Cefepime 2gm IV q24h Discontinue vancomycin Plan to treat for a total duration of 7 days Monitor renal function, WBC, temp, and clinical status  Height: 6\' 1"  (185.4 cm) Weight: 143 lb 4.8 oz (65 kg) IBW/kg (Calculated) : 75.4  Temp (24hrs), Avg:97.7 F (36.5 C), Min:97.3 F (36.3 C), Max:98.2 F (36.8 C)  Recent Labs  Lab 05/27/19 0500 05/27/19 1012 05/27/19 1345 05/28/19 0719 05/28/19 1129 05/28/19 1630 05/29/19 0413 05/29/19 1250 05/29/19 1522 05/30/19 0500 05/30/19 0718 05/31/19 0610  WBC 14.4*  --   --  17.6*  --   --  19.9*  --   --  17.4*  --  16.2*  CREATININE 2.33*  --   --  2.56*  --   --  2.94*  --   --   --  2.78* 2.56*  LATICACIDVEN  --    < > 1.2  --  1.2 1.3  --  0.9 1.3  --   --   --    < > = values in this interval not displayed.    Estimated Creatinine Clearance: 22.2 mL/min (A) (by C-G formula based on SCr of 2.56 mg/dL (H)).    Allergies  Allergen Reactions  . Lisinopril Swelling    Angioedema 06/10/11  . Chantix [Varenicline] Other (See Comments)    Caused insomnia  . Penicillins Hives    Did it involve swelling of the face/tongue/throat, SOB, or low BP? No Did it involve sudden or severe rash/hives, skin peeling, or any reaction on the inside of your mouth or nose? No Did you need to seek medical attention at a hospital or doctor's office? No When did it last happen?50 Years If all above answers are "NO", may proceed  with cephalosporin use.      Antimicrobials this admission: Vanc 3/5>>3/8 Cefepime 3/5>>  Microbiology results: BCx:3/5: no growth x3 days MRSA PCR 2/24: negative Bcx 2/23: ngF Ucx 2/23: suggest recollect  COVID/influ 2/23: negative  Sherren Kerns, PharmD PGY1 Acute Care Pharmacy Resident 05/31/2019 1:07 PM

## 2019-06-01 DIAGNOSIS — N170 Acute kidney failure with tubular necrosis: Secondary | ICD-10-CM

## 2019-06-01 DIAGNOSIS — N1831 Chronic kidney disease, stage 3a: Secondary | ICD-10-CM

## 2019-06-01 DIAGNOSIS — E43 Unspecified severe protein-calorie malnutrition: Secondary | ICD-10-CM

## 2019-06-01 LAB — COMPREHENSIVE METABOLIC PANEL
ALT: 62 U/L — ABNORMAL HIGH (ref 0–44)
AST: 57 U/L — ABNORMAL HIGH (ref 15–41)
Albumin: 1.9 g/dL — ABNORMAL LOW (ref 3.5–5.0)
Alkaline Phosphatase: 92 U/L (ref 38–126)
Anion gap: 9 (ref 5–15)
BUN: 50 mg/dL — ABNORMAL HIGH (ref 8–23)
CO2: 21 mmol/L — ABNORMAL LOW (ref 22–32)
Calcium: 8.2 mg/dL — ABNORMAL LOW (ref 8.9–10.3)
Chloride: 97 mmol/L — ABNORMAL LOW (ref 98–111)
Creatinine, Ser: 2.21 mg/dL — ABNORMAL HIGH (ref 0.44–1.00)
GFR calc Af Amer: 26 mL/min — ABNORMAL LOW (ref >60)
GFR calc non Af Amer: 22 mL/min — ABNORMAL LOW (ref >60)
Glucose, Bld: 101 mg/dL — ABNORMAL HIGH (ref 70–99)
Potassium: 3.4 mmol/L — ABNORMAL LOW (ref 3.5–5.1)
Sodium: 127 mmol/L — ABNORMAL LOW (ref 135–145)
Total Bilirubin: 1.4 mg/dL — ABNORMAL HIGH (ref 0.3–1.2)
Total Protein: 4.7 g/dL — ABNORMAL LOW (ref 6.5–8.1)

## 2019-06-01 LAB — COOXEMETRY PANEL
Carboxyhemoglobin: 1.7 % — ABNORMAL HIGH (ref 0.5–1.5)
Methemoglobin: 1.6 % — ABNORMAL HIGH (ref 0.0–1.5)
O2 Saturation: 55.7 %
Total hemoglobin: 10 g/dL — ABNORMAL LOW (ref 12.0–16.0)

## 2019-06-01 LAB — CBC
HCT: 27.9 % — ABNORMAL LOW (ref 36.0–46.0)
Hemoglobin: 9.9 g/dL — ABNORMAL LOW (ref 12.0–15.0)
MCH: 25.6 pg — ABNORMAL LOW (ref 26.0–34.0)
MCHC: 35.5 g/dL (ref 30.0–36.0)
MCV: 72.1 fL — ABNORMAL LOW (ref 80.0–100.0)
Platelets: 179 10*3/uL (ref 150–400)
RBC: 3.87 MIL/uL (ref 3.87–5.11)
RDW: 20.7 % — ABNORMAL HIGH (ref 11.5–15.5)
WBC: 15.3 10*3/uL — ABNORMAL HIGH (ref 4.0–10.5)
nRBC: 0 % (ref 0.0–0.2)

## 2019-06-01 LAB — SARS CORONAVIRUS 2 (TAT 6-24 HRS): SARS Coronavirus 2: NEGATIVE

## 2019-06-01 LAB — MAGNESIUM: Magnesium: 2 mg/dL (ref 1.7–2.4)

## 2019-06-01 MED ORDER — ENOXAPARIN SODIUM 30 MG/0.3ML ~~LOC~~ SOLN
30.0000 mg | SUBCUTANEOUS | Status: DC
  Administered 2019-06-02 – 2019-06-03 (×2): 30 mg via SUBCUTANEOUS
  Filled 2019-06-01 (×2): qty 0.3

## 2019-06-01 MED ORDER — FUROSEMIDE 40 MG PO TABS
40.0000 mg | ORAL_TABLET | Freq: Every day | ORAL | Status: DC
  Administered 2019-06-01 – 2019-06-03 (×3): 40 mg via ORAL
  Filled 2019-06-01 (×3): qty 1

## 2019-06-01 MED ORDER — POTASSIUM CHLORIDE CRYS ER 20 MEQ PO TBCR
40.0000 meq | EXTENDED_RELEASE_TABLET | Freq: Two times a day (BID) | ORAL | Status: AC
  Administered 2019-06-01 (×2): 40 meq via ORAL
  Filled 2019-06-01 (×2): qty 2

## 2019-06-01 MED ORDER — ENOXAPARIN SODIUM 30 MG/0.3ML ~~LOC~~ SOLN: 30.0000 mg | SUBCUTANEOUS | Status: DC

## 2019-06-01 NOTE — Progress Notes (Signed)
Family Medicine Teaching Service Daily Progress Note Intern Pager: [phone]  Patient name: Leah [name] [facility] Medical record number: [id number] Date of birth: 04-14-1950 Age: 69 y.o. Gender: female  Primary Care Provider: Guadalupe Dawn, MD Consultants: Heart Failure Code Status: Full   Pt Overview and Major Events to Date:  2/23 admitted in cardiogenic shock to ICU 2/28 transferred to floor  Assessment and Plan: Leah [name] a 69 y.o.femalewho presented with altered mental status, found to be in cardiogenic shock 2/2 viral cardiomyopathy. PMH is significant for CAD, HTN, HLD, PVD, HFpEF (now HFrEF EF 10%), CKD, MV replacement, Hx bronchogenic lung cancer, and tobacco use.  Cardiogenic Shock with biventricular dysfunction, secondary to acute viral myocarditis Alert today and ANO X 3. Denies chest pain or shortness of breath.  Vital signs stable, saturating well on room air. In: Total 1.8L, Out: 658ml. Weight dropped by 1.4kg in the last day  - Heart failure following, appreciate recs, per last note they do not feel they will be able to wean her off milrinone  -Continue bidil 1 tab tid -Continue milrinone 0.20mcg/kg/min, management per HF, appreciate recommendations  -Hold losartan, dig, spiro -Fluid restriction, 1.5L -Flevate legs to help with the swelling -Tylenol prn  AMS  Leukocytosis WBC 15.3. 16.2>17.4>19.9, afebrile. Treating for presumed UTI although there is no culture data yet. Large leuks and many bacteria on UA. Procal 1.2->1.02. Blood culture no growth at 3 days. Urine culture no growth at 2 days Vanc (3/5-3/8), Cefepime (3/5-) -F/u blood and urine cultures -Consider switching to p.o. antibiotics today as white count improving and afebrile -Continue cefepime per pharmacy  CAD S/P CABG with LIMA to LAD and MVR (bioprosthetic).  - continue dapt - continue lipitor 80mg , zetia 10mg  - continue amiodarone 200mg  bid - continue holding spiro, bb,  lasix  Goals of care Palliative involved. DNR/DNI. Hopeful at recovery and has improved significantly from admission although things remain tenuous.  - palliative following appreciate recs - pt/ot/nutrition - dispo pending clinical course - dnr/dni  Lung cancer s/p resection Currently on RA.  No home O2. - stable, cardiac plan per above  Severe PAD  S/p left femoropopliteal and L CEA - bilateral RAS s/p stenting - continue dapt - cont statin and zetia per above  AKI On CKD: Improving Cr 2.21, 2.56> 2.78>2.94.   Baseline appears to be 1.0-1.3.  2/2 cardiogenic shock.Worsening after Lasix dose 2 days ago. - cont milrinone per above - Avoid nephrotoxic agents  Hypervolemic hyponatremia Na 127 today, 124> [id number] -Continue fluid restriction  -Daily BMP  Frequent PVCs/ Hypokalemia/ Hypomagnesemia Goal to keep K greater than 4 and mag greater than 2.   K 3.4, K 4 on 3/9, repleted with Kdur 40mg  BID today Mag 2, 2.2 on 3/8 -Daily BMP and Mg daily -Replete Mag as needed -Continue p.o. amiodarone 200 mg twice daily  Iron Deficiency Anemia Denies any hematochezia or melena. Hgb 9.9 today. 10.8>11.2>9.9. Admission Hgb was 14.6. MCV 68.9.  Ferritin 120.  Iron 20, Sat 8.   - continue feraheme 510mg  weekly, next dose 3/8 - outpatient colonoscopy - transfusion threshold 8 given CAD  FEN/GI: Dysphagia 3 Diet PPx: Lovenox   Disposition:  planning for SNF when stable from cardiac standpoint  Subjective:  Feels well. No new concerns. Denies chest pain or shortness of breath.  Objective: Temp:  [97.3 F (36.3 C)-98.2 F (36.8 C)] 98.2 F (36.8 C) (03/09 0320) Pulse Rate:  [87-99] 99 (03/09 0748) Resp:  [13-20] 17 (03/09 0748) BP: (  106-117)/(44-63) 113/48 (03/09 0748) SpO2:  [98 %-100 %] 99 % (03/09 0748) Weight:  [63.6 kg] 63.6 kg (03/09 0440)  Physical Exam:   General: Alert sitting up in bed, well appearing 69 yr old AA female, pleasant  Cardio: Normal S1 and  S2, irregular rhythm, No murmurs or rubs.   Pulm: Clear to auscultation bilaterally. Normal respiratory effort Abdomen: Bowel sounds normal. Abdomen soft and non-tender.  Extremities: No peripheral edema. Warm/ well perfused.  Strong radial pulse Neuro: Cranial nerves grossly intact, ANO x 3  Laboratory: Recent Labs  Lab 05/30/19 0500 05/31/19 0610 06/01/19 0315  WBC 17.4* 16.2* 15.3*  HGB 10.0* 10.9* 9.9*  HCT 27.7* 31.4* 27.9*  PLT 177 191 179   Recent Labs  Lab 05/30/19 0718 05/31/19 0610 06/01/19 0315  NA 126* 124* 127*  K 3.2* 4.0 3.4*  CL 91* 93* 97*  CO2 23 20* 21*  BUN 59* 54* 50*  CREATININE 2.78* 2.56* 2.21*  CALCIUM 8.1* 8.1* 8.2*  PROT  --   --  4.7*  BILITOT  --   --  1.4*  ALKPHOS  --   --  92  ALT  --   --  62*  AST  --   --  57*  GLUCOSE 89 179* 101*    Imaging/Diagnostic Tests: No results found.  Leah Haw, MD 06/01/2019, 9:44 AM PGY-1, [place] Intern pager: [phone], text pages welcome

## 2019-06-01 NOTE — Progress Notes (Addendum)
Advanced Heart Failure Rounding Note  PCP-Cardiologist: Peter Martinique, MD   Subjective:    CMRI-Findings consistent with acute myocarditis. LVEF 11% RVEF 18% (severe biventricular dysfunction)  Remains of milrinone 0.25, co-ox lower today at 56%.   CVP 6 today. SCr improving, 2.78>>2.56>>2.21.   Remains on abx for UTI. WBC down trending.   Feels weak/tired today. No dyspnea.     Objective:   Weight Range: 63.6 kg Body mass index is 18.5 kg/m.   Vital Signs:   Temp:  [97.3 F (36.3 C)-98.2 F (36.8 C)] 98.2 F (36.8 C) (03/09 0320) Pulse Rate:  [87-99] 99 (03/09 0748) Resp:  [13-20] 17 (03/09 0748) BP: (106-117)/(44-63) 113/48 (03/09 0748) SpO2:  [98 %-100 %] 99 % (03/09 0748) Weight:  [63.6 kg] 63.6 kg (03/09 0440) Last BM Date: 05/31/19  Weight change: Filed Weights   05/30/19 0500 05/31/19 0354 06/01/19 0440  Weight: 63.7 kg 65 kg 63.6 kg    Intake/Output:   Intake/Output Summary (Last 24 hours) at 06/01/2019 0939 Last data filed at 06/01/2019 0430 Gross per 24 hour  Intake 1648.04 ml  Output 610 ml  Net 1038.04 ml      Physical Exam   CVP 6 General:  Thin/cachetic and fatigued appearing AAF. sitting in bed. No respiratory difficulty HEENT: normal anicteric Neck: supple. no JVD. Carotids 2+ bilat; no bruits. No lymphadenopathy or thryomegaly appreciated. Cor: PMI nondisplaced. RRR. +s3 Lungs: clear no wheeze Abdomen: soft, nontender, nondistended. No hepatosplenomegaly. No bruits or masses. Good bowel sounds. Extremities: no cyanosis, clubbing, rash, tr edema Neuro: alert & oriented x 3, cranial nerves grossly intact. moves all 4 extremities w/o difficulty. Affect pleasant   Telemetry   NSR 70s-80s Personally reviewed   Labs    CBC Recent Labs    05/31/19 0610 06/01/19 0315  WBC 16.2* 15.3*  HGB 10.9* 9.9*  HCT 31.4* 27.9*  MCV 71.7* 72.1*  PLT 191 700   Basic Metabolic Panel Recent Labs    05/31/19 0610 06/01/19 0315  NA  124* 127*  K 4.0 3.4*  CL 93* 97*  CO2 20* 21*  GLUCOSE 179* 101*  BUN 54* 50*  CREATININE 2.56* 2.21*  CALCIUM 8.1* 8.2*  MG 2.2 2.0   Liver Function Tests Recent Labs    06/01/19 0315  AST 57*  ALT 62*  ALKPHOS 92  BILITOT 1.4*  PROT 4.7*  ALBUMIN 1.9*   No results for input(s): LIPASE, AMYLASE in the last 72 hours. Cardiac Enzymes No results for input(s): CKTOTAL, CKMB, CKMBINDEX, TROPONINI in the last 72 hours.  BNP: BNP (last 3 results) Recent Labs    05/18/19 1707 05/21/19 1206  BNP >4,500.0* 1,490.6*    ProBNP (last 3 results) No results for input(s): PROBNP in the last 8760 hours.   D-Dimer No results for input(s): DDIMER in the last 72 hours. Hemoglobin A1C No results for input(s): HGBA1C in the last 72 hours. Fasting Lipid Panel No results for input(s): CHOL, HDL, LDLCALC, TRIG, CHOLHDL, LDLDIRECT in the last 72 hours. Thyroid Function Tests No results for input(s): TSH, T4TOTAL, T3FREE, THYROIDAB in the last 72 hours.  Invalid input(s): FREET3  Other results:   Imaging    No results found.   Medications:     Scheduled Medications: . amiodarone  200 mg Oral BID  . aspirin  81 mg Oral Daily  . carbamide peroxide  5 drop Left EAR q morning - 10a  . Chlorhexidine Gluconate Cloth  6 each Topical Daily  .  clopidogrel  75 mg Oral Daily  . enoxaparin (LOVENOX) injection  30 mg Subcutaneous Q24H  . feeding supplement (ENSURE ENLIVE)  237 mL Oral TID BM  . fluticasone  1 puff Inhalation Daily  . gabapentin  200 mg Oral QHS  . isosorbide-hydrALAZINE  1 tablet Oral TID  . multivitamin with minerals  1 tablet Oral Daily  . potassium chloride  40 mEq Oral BID  . senna  1 tablet Oral Daily    Infusions: . ceFEPime (MAXIPIME) IV Stopped (05/31/19 1516)  . milrinone 0.25 mcg/kg/min (06/01/19 0525)    PRN Medications: acetaminophen, albuterol, cyclobenzaprine, Gerhardt's butt cream, ondansetron (ZOFRAN) IV, polyethylene glycol,  sorbitol    Assessment/Plan   1. Cardiogenic shock with sever biventricular dysfunction - Previous EF 40% in 10/18 - Echo in ER EF 10% with severe RV dysfunction. Moderate to severe AI - cMRI - Findings consistent with acute myocarditis. LVEF 11% RV EF 18%  - initial lactate > 6.7 - initial swan #s w/ low output and elevated filling pressures: CO/CI 1.49/0.84, RA 16, PCWP 26, PAPi 0.74. Co-ox 39% - Suspect PVCs playing a role.  Continue po amio to suppress PVCs.  - cMRI - Findings consistent with acute myocarditis. LVEF 11% RV reduced., Severe Biventricular Failure. - Remains on milrinone 0.25 mcg. Co-ox lower today at 56%.  - CVP 6. Restart Lasix today, 40 mg qd. Follow BMP - Continue Bidil 1 tablet tid  - holding spiro with elevated SCr  - No dig with elevated creatinine.  - Hold off on losartan with elevated creatinine.  - not candidate for mechanical support with PAD and severe AI - Palliative Care discussions ongoing. - Plan transition to SNF w/ milrinone. Will change out PICC for single lumen tunneled catheter.   2. CAD - s/p CABG with LIMA to LAD and MVR (bioprosthetic) - Trop 407 -> 387->492.  - No s/s ischemia - continue asa, Plavix + statin   3. Moderate to severe AI - consider TEE - BCx NGTD  4. Lung cancer s/p resection - stable  5. Severe PAD - s/p left fem-pop and L CEA - bilateral RAS s/p stenting - stable   6. AKI - recurrent. Suspect ATN from low BP with infection and HF - Creatinine improving 2.94 -> 2.78->2.56->2.21.  - Adding back PO Lasix today. Follow BMP  - Continue milrinone.  - not candidate for HD  7. AMS - resolved. Likely due to UTI/shock  8. Hypokalemia/hyponatremia:  - K 3.4 today. Will supp  - Sodium improving, 124>>127. Mentating ok.   9. PVCs - PVCs suppressed. Continue amio 200 mg twice a day.   - Keep K> 4. Mg > 2.0 - no b-blocker with shock   10. Urosepsis - treated w/ vanc/meropenem>>>cefepime  - WBC 19k ->  17k->16K->15K - apparently urine cx not sent -abx per primary team   11. DNR/DNI   Dispo: Plan transition to SNF on Milrinone, possibly tomorrow. Will ask IR to assist w/ single lumen tunneled catheter today.     Length of Stay: [address] PA-C  06/01/2019, 9:39 AM  Advanced Heart Failure Team Pager [phone] (M-F; 7a - 4p)  Please contact [facility] for night-coverage after hours (4p -7a ) and weekends on [email or web address]  Patient seen and examined with the above-signed Advanced Practice Provider and/or Housestaff. I personally reviewed laboratory data, imaging studies and relevant notes. I independently examined the patient and formulated the important aspects of the plan. I have edited the note  to reflect any of my changes or salient points. I have personally discussed the plan with the patient and/or family.  Remains weak and tenuous. Co-ox marginal on milrinone 0.25. Appears inotrope dependent for now. Will ask IR to place tunneled PICC for home/SNF milrinone. Start lasix 40 daily. Continue Bidil. Possible to SNF tomorrow. Prognosis seems poor.   D/w Dr. Nori Riis at bedside.   Glori Bickers, MD  10:20 AM

## 2019-06-01 NOTE — Progress Notes (Signed)
Palliative:  HPI:68 y.o.femalewith past medical history of systolic CHF (EF 12% 2449), CAD s/p stenting 2016, mitral valve replacement 2016, HTN, HLD, COPD, tobacco use, lung adenocarcinoma stage 1 s/p resection 2018, PVD/PADadmitted on2/23/2021with cardiogenic shock and EF decreased to 10%, severe RV dysfunction, severe aortic insufficiency as well withlikelyacute viral myocarditis heart failure team.Required ICU admission with milrinone and bicarb infusion.Progressed to step down status.  I met today with Leah [name]. She is alert, oriented and sitting up in bed watching television. She has been able to have bowel movement and is feeling much better. We discussed that her kidney function is showing some improvement which is good. We discussed that the struggle moving forward is going to be keeping the balance of her medications with heart and renal dysfunction and that this balance can be thrown off very easily. She understands. She continues to be hopeful for improvement. Agrees to move towards rehab as early as tomorrow to get stronger with ultimate goal to return home where she lives with her 3 adult children and has good support.   All questions/concerns addressed. Emotional support provided.   Exam: Alert, oriented. No distress. Breathing regular, unlabored, Abd soft, less distended. BLE less edematous. Generalized weakness.   Plan: - Likely to SNF tomorrow. Recommend outpatient palliative to continue to follow. Please recommend outpatient palliative referral in d/c summary. I will notify nurse liaison to referral.   20 min  Vinie Sill, NP Palliative Medicine Team Pager [phone] (Please see [email or web address] for schedule) Team Phone [phone]    Greater than 50%  of this time was spent counseling and coordinating care related to the above assessment and plan

## 2019-06-01 NOTE — TOC Progression Note (Signed)
Transition of Care [facility]) - Progression Note    Patient Details  Name: ZAMERIA VOGL MRN: [id number] Date of Birth: 03-06-1951  Transition of Care [facility]) CM/SW [place], [place] Phone Number: 06/01/2019, 11:37 AM  Clinical Narrative:     CSW spoke with [facility] rep who requested family complete paperwork at 2pm today in order to secure bed for tomorrow's anticipated discharge.   CSW spoke with [name] who will go to [facility] at 2pm today for paperwork.   Carolynn Sayers also updated in order to assist in milrinone set up at discharge to [facility].   Expected Discharge Plan: [place] Barriers to Discharge: Continued Medical Work up  Expected Discharge Plan and Services Expected Discharge Plan: [place] In-house Referral: NA Discharge Planning Services: CM Consult Post Acute Care Choice: [facility] arrangements for the past 2 months: Single Family Home                           HH Arranged: RN, Disease Management, PT [place] Agency: [place] ([facility]) Date HH Agency Contacted: 05/21/19 Time [place]: 1502 Representative spoke with at [place]: [place] ([place]) Interventions    Readmission Risk Interventions Readmission Risk Prevention Plan 05/21/2019  Transportation Screening Complete  PCP or Specialist Appt within 3-5 Days Complete  HRI or [place] Complete  Social Work Consult for [place] Planning/Counseling Complete  Palliative Care Screening Not Applicable  Medication Review Press photographer) Complete  Some recent data might be hidden

## 2019-06-01 NOTE — Progress Notes (Signed)
   Patient Status: MC IP  Assessment and Plan: Patient in need of venous access.   Tunneled single lumen central catheter for home milrinone  _____________________________________________________________________  History of Present Illness: Leah [name] is a 69 y.o. female   Cardiogenic shock with severe biventricular dysfunction Acute myocarditis Pt has PICC/midline placed by IV team 05/20/19 MD wants tunneled cental catheter for home milrinone- long term  On Plavix daily - Hx CAD/CABG 2016 and Bilat Renal stents 2016  Allergies and medications reviewed.   Review of Systems: A 12 point ROS discussed and pertinent positives are indicated in the HPI above.  All other systems are negative.   Vital Signs: BP (!) 113/48 (BP Location: Right Arm)   Pulse 99   Temp 98.2 F (36.8 C) (Oral)   Resp 17   Ht 6\' 1"  (1.854 m)   Wt 140 lb 3.4 oz (63.6 kg) Comment: wearing 2 gowns  SpO2 99%   BMI 18.50 kg/m   Physical Exam Vitals reviewed.  Neurological:     Mental Status: She is alert and oriented to person, place, and time.  Psychiatric:        Behavior: Behavior normal.    Imaging reviewed.   Labs:  COAGS: Recent Labs    05/19/19 0204 05/21/19 1206  INR 1.8* 1.3*  APTT  --  40*    BMP: Recent Labs    05/29/19 0413 05/30/19 0718 05/31/19 0610 06/01/19 0315  NA 124* 126* 124* 127*  K 3.1* 3.2* 4.0 3.4*  CL 88* 91* 93* 97*  CO2 23 23 20* 21*  GLUCOSE 128* 89 179* 101*  BUN 58* 59* 54* 50*  CALCIUM 7.9* 8.1* 8.1* 8.2*  CREATININE 2.94* 2.78* 2.56* 2.21*  GFRNONAA 16* 17* 19* 22*  GFRAA 18* 20* 22* 26*    Scheduled for single lumen tunneled central catheter placement --- for long term home milrinone Wbc trending down-- now 15 afeb On Plavix Discussed with Dr Jake Seats to move ahead---plan for 3/10  Pt is aware of procedure benefits and risks  Including but not limited to Infection; bleeding; vessel damage  Electronically Signed: Lavonia Drafts, PA-C 06/01/2019, 11:02 AM   I spent a total of 15 minutes in face to face in clinical consultation, greater than 50% of which was counseling/coordinating care for venous access.

## 2019-06-01 NOTE — Plan of Care (Signed)
  Problem: Education: Goal: Knowledge of General Education information will improve Description: Including pain rating scale, medication(s)/side effects and non-pharmacologic comfort measures Outcome: Progressing   Problem: Health Behavior/Discharge Planning: Goal: Ability to manage health-related needs will improve Outcome: Progressing   Problem: Clinical Measurements: Goal: Ability to maintain clinical measurements within normal limits will improve Outcome: Progressing Goal: Will remain free from infection Outcome: Progressing Goal: Cardiovascular complication will be avoided Outcome: Progressing   Problem: Activity: Goal: Risk for activity intolerance will decrease Outcome: Progressing   Problem: Pain Managment: Goal: General experience of comfort will improve Outcome: Progressing

## 2019-06-01 NOTE — Progress Notes (Signed)
Occupational Therapy Treatment Patient Details Name: Leah [name] MRN: [id number] DOB: [birth year] Today's Date: 06/01/2019    History of present illness 69 y.o. 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 Pt brought to the ED for AMS, now with concern for cardiogenic shock.   OT comments  Pt progressing to OOB ADL and mobility. Pt performing own pericare for minA overall for stability in standing. Pt following commands and sequencing appropriately with no cues required. Pt continues to have short term memory deficits at this time. Pt ambulating 2' to [facility] and 6' to recliner today. Pt lightheaded in sitting on and off, BP 128/68 at EOB. Pt would benefit from continued OT skilled services for ADL and energy conservation. OT following acutely.    Follow Up Recommendations  SNF;Supervision/Assistance - 24 hour    Equipment Recommendations  Other (comment)(TBD)    Recommendations for Other Services      Precautions / Restrictions Precautions Precautions: Fall Precaution Comments: watch HR and 02 Restrictions Weight Bearing Restrictions: No       Mobility Bed Mobility Overal bed mobility: Needs Assistance Bed Mobility: Rolling;Sidelying to Sit Rolling: Min guard Sidelying to sit: Min assist;HOB elevated       General bed mobility comments: Cues for technique, use of rail and assist with trunk to get to EOB.  Transfers Overall transfer level: Needs assistance Equipment used: Rolling walker (2 wheeled) Transfers: Sit to/from Stand Sit to Stand: Mod assist         General transfer comment: Mod A for power up; transferred x1 to [facility] x1 to recliner    Balance Overall balance assessment: Needs assistance Sitting-balance support: Feet supported;No upper extremity supported Sitting balance-[name]:  Fair Sitting balance - Comments: min guard to close supervision for safety    Standing balance support: During functional activity Standing balance-[name]: Poor Standing balance comment: reliant on RW for support                           ADL either performed or assessed with clinical judgement   ADL Overall ADL's : Needs assistance/impaired     Grooming: Set up;Sitting;Wash/dry face;Oral care               Lower Body Dressing: Minimal assistance;Cueing for safety;Sitting/lateral leans;Sit to/from stand   Toilet Transfer: Minimal assistance;Ambulation;RW;BSC   Toileting- Clothing Manipulation and Hygiene: Minimal assistance;Cueing for safety;Sitting/lateral lean;Sit to/from stand       Functional mobility during ADLs: Minimal assistance;Cueing for safety;Rolling walker General ADL Comments: Pt ambulating 2' to [facility] and 6' to recliner today. pt lightheaded in sitting on and off, BP 128/68 at EOB     Vision   Vision Assessment?: No apparent visual deficits   Perception     Praxis      Cognition Arousal/Alertness: Awake/alert Behavior During Therapy: WFL for tasks assessed/performed Overall Cognitive Status: Impaired/Different from baseline Area of Impairment: Memory                     Memory: Decreased short-term memory         General Comments: Pt following commands and sequencing appropriately with no cues required. Pt continues to have short term memory deficits at this time.        Exercises  Shoulder Instructions       General Comments VSS on RA    Pertinent Vitals/ Pain       Pain Assessment: 0-10 Pain Score: 0-No pain Pain Intervention(s): Monitored during session  Home Living                                          Prior Functioning/Environment              Frequency  Min 2X/week        Progress Toward Goals  OT Goals(current goals can now be found in the care plan section)   Progress towards OT goals: Progressing toward goals  Acute Rehab OT Goals Patient Stated Goal: To return to independent PLOF OT Goal Formulation: With patient Time For Goal Achievement: 06/15/19 Potential to Achieve Goals: Good ADL Goals Pt Will Perform Grooming: with modified independence;standing Pt Will Transfer to Toilet: with modified independence;ambulating;regular height toilet Pt Will Perform Tub/Shower Transfer: with modified independence;ambulating;shower seat Additional ADL Goal #1: Pt will recall and apply 1-3 ECS strategies to implement into BADL for increased endurance and safety Additional ADL Goal #2: Pt will stand with minguardA x5 mins for ADL tasks at sink to increase activity tolerance in prep for higher level ADL  Plan Discharge plan remains appropriate    Co-evaluation                 AM-PAC OT "6 Clicks" Daily Activity     Outcome Measure   Help from another person eating meals?: None Help from another person taking care of personal grooming?: A Little Help from another person toileting, which includes using toliet, bedpan, or urinal?: A Little Help from another person bathing (including washing, rinsing, drying)?: A Little Help from another person to put on and taking off regular upper body clothing?: A Little Help from another person to put on and taking off regular lower body clothing?: A Little 6 Click Score: 19    End of Session Equipment Utilized During Treatment: Rolling walker;Gait belt  OT Visit Diagnosis: Unsteadiness on feet (R26.81);Muscle weakness (generalized) (M62.81)   Activity Tolerance Patient limited by fatigue   Patient Left in chair;with call bell/phone within reach   Nurse Communication Mobility status        Time: 9629-5284 OT Time Calculation (min): 20 min  Charges: OT General Charges $OT Visit: 1 Visit OT Treatments $Self Care/Home Management : 8-22 mins   Jefferey Pica, OTR/L Acute Rehabilitation Services Pager:  [phone] Office: [phone]    Judy Pollman C 06/01/2019, 3:10 PM

## 2019-06-02 ENCOUNTER — Inpatient Hospital Stay (HOSPITAL_COMMUNITY): Payer: Medicare Other

## 2019-06-02 HISTORY — PX: IR US GUIDE VASC ACCESS LEFT: IMG2389

## 2019-06-02 HISTORY — PX: IR FLUORO GUIDE CV LINE LEFT: IMG2282

## 2019-06-02 LAB — MAGNESIUM: Magnesium: 2 mg/dL (ref 1.7–2.4)

## 2019-06-02 LAB — CBC
HCT: 27.4 % — ABNORMAL LOW (ref 36.0–46.0)
Hemoglobin: 9.5 g/dL — ABNORMAL LOW (ref 12.0–15.0)
MCH: 25.3 pg — ABNORMAL LOW (ref 26.0–34.0)
MCHC: 34.7 g/dL (ref 30.0–36.0)
MCV: 72.9 fL — ABNORMAL LOW (ref 80.0–100.0)
Platelets: 171 10*3/uL (ref 150–400)
RBC: 3.76 MIL/uL — ABNORMAL LOW (ref 3.87–5.11)
RDW: 20.8 % — ABNORMAL HIGH (ref 11.5–15.5)
WBC: 14.1 10*3/uL — ABNORMAL HIGH (ref 4.0–10.5)
nRBC: 0 % (ref 0.0–0.2)

## 2019-06-02 LAB — BASIC METABOLIC PANEL
Anion gap: 12 (ref 5–15)
BUN: 44 mg/dL — ABNORMAL HIGH (ref 8–23)
CO2: 21 mmol/L — ABNORMAL LOW (ref 22–32)
Calcium: 8.5 mg/dL — ABNORMAL LOW (ref 8.9–10.3)
Chloride: 97 mmol/L — ABNORMAL LOW (ref 98–111)
Creatinine, Ser: 1.86 mg/dL — ABNORMAL HIGH (ref 0.44–1.00)
GFR calc Af Amer: 32 mL/min — ABNORMAL LOW (ref >60)
GFR calc non Af Amer: 28 mL/min — ABNORMAL LOW (ref >60)
Glucose, Bld: 90 mg/dL (ref 70–99)
Potassium: 3.6 mmol/L (ref 3.5–5.1)
Sodium: 130 mmol/L — ABNORMAL LOW (ref 135–145)

## 2019-06-02 LAB — CULTURE, BLOOD (ROUTINE X 2)
Culture: NO GROWTH
Culture: NO GROWTH
Special Requests: ADEQUATE
Special Requests: ADEQUATE

## 2019-06-02 LAB — COOXEMETRY PANEL
Carboxyhemoglobin: 1.8 % — ABNORMAL HIGH (ref 0.5–1.5)
Methemoglobin: 1.2 % (ref 0.0–1.5)
O2 Saturation: 59.6 %
Total hemoglobin: 9.9 g/dL — ABNORMAL LOW (ref 12.0–16.0)

## 2019-06-02 MED ORDER — POTASSIUM CHLORIDE CRYS ER 20 MEQ PO TBCR
20.0000 meq | EXTENDED_RELEASE_TABLET | Freq: Two times a day (BID) | ORAL | Status: DC
  Administered 2019-06-02: 15:00:00 40 meq via ORAL
  Administered 2019-06-02 – 2019-06-03 (×2): 20 meq via ORAL
  Filled 2019-06-02 (×3): qty 1

## 2019-06-02 MED ORDER — LIDOCAINE HCL (PF) 1 % IJ SOLN
INTRAMUSCULAR | Status: AC | PRN
  Administered 2019-06-02: 10 mL

## 2019-06-02 MED ORDER — MIDAZOLAM HCL 2 MG/2ML IJ SOLN
INTRAMUSCULAR | Status: AC
  Filled 2019-06-02: qty 2

## 2019-06-02 MED ORDER — LIDOCAINE HCL 1 % IJ SOLN
INTRAMUSCULAR | Status: AC
  Filled 2019-06-02: qty 20

## 2019-06-02 MED ORDER — MIDAZOLAM HCL 2 MG/2ML IJ SOLN
INTRAMUSCULAR | Status: AC | PRN
  Administered 2019-06-02 (×2): 0.5 mg via INTRAVENOUS

## 2019-06-02 MED ORDER — FENTANYL CITRATE (PF) 100 MCG/2ML IJ SOLN
INTRAMUSCULAR | Status: AC
  Filled 2019-06-02: qty 2

## 2019-06-02 NOTE — Progress Notes (Signed)
Spoke to Dr. Posey Pronto.  R/o possible thrombosis, Pt will not be discharged today.  Notified SW and Advance [place] RN of Pt of staying tonight for additional tests.  Pt maybe discharge to SNF on 06/03/2019 pending results of studies.

## 2019-06-02 NOTE — Procedures (Signed)
Interventional Radiology Procedure:   Indications: Cardiogenic shock and needs IV milrinone  Procedure: Tunneled central line placement  Findings: Multiple collaterals in right neck and suspect proximal occlusion of right IJ.  Small amount of thrombus in right IJ.    Patent Lt IJ.  Single lumen Powerline placed with tip at SVC/RA junction  Complications: None     EBL: less than 20 ml  Plan: Central line is ready for use.    Consider venous duplex of right IJ and right upper extremity to evaluate for additional thrombus.    [name] R. Anselm Pancoast, MD  Pager: [phone]

## 2019-06-02 NOTE — Plan of Care (Signed)

## 2019-06-02 NOTE — Progress Notes (Signed)
Physical Therapy Treatment Patient Details Name: Leah [name] MRN: [id number] DOB: 12-May-1950 Today's Date: 06/02/2019    History of Present Illness 69 y.o. 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 Pt admitted with cardiogenic shock due to acute myocarditis.    PT Comments    Pt demonstrating improved transfers today (min A).  She was able to ambulate 6' with RW and min A.  Required mod cues for safety and technique with transfers. Further treatment limited due to pt with anxiety and focused on that she just urinated in bed.      Follow Up Recommendations  SNF;Supervision/Assistance - 24 hour     Equipment Recommendations  Other (comment)(TBD next venue)    Recommendations for Other Services       Precautions / Restrictions Precautions Precautions: Fall Precaution Comments: watch HR and 02    Mobility  Bed Mobility Overal bed mobility: Needs Assistance Bed Mobility: Supine to Sit;Sit to Supine     Supine to sit: Min assist     General bed mobility comments: Cues for technique, use of rail and assist with trunk to get to EOB.  Transfers Overall transfer level: Needs assistance Equipment used: Rolling walker (2 wheeled) Transfers: Sit to/from Stand Sit to Stand: Min assist;From elevated surface         General transfer comment: cues for safe hand placement; performed x 3  Ambulation/Gait Ambulation/Gait assistance: Min assist Gait Distance (Feet): 6 Feet Assistive device: Rolling walker (2 wheeled) Gait Pattern/deviations: Decreased stride length;Step-through pattern;Trunk flexed Gait velocity: decreased   General Gait Details: Pt anxious and focused on fact that she urinated in bed; only able to walk to chair.  Cues for posture and RW.   Stairs             Wheelchair  Mobility    Modified Rankin (Stroke Patients Only)       Balance Overall balance assessment: Needs assistance Sitting-balance support: Feet supported;No upper extremity supported Sitting balance-Leah [name]: Fair Sitting balance - Comments: tends to lean forward due to fatigue   Standing balance support: Bilateral upper extremity supported;During functional activity Standing balance-Leah [name]: Fair Standing balance comment: reliant on RW for support during ADLs                            Cognition Arousal/Alertness: Awake/alert Behavior During Therapy: WFL for tasks assessed/performed Overall Cognitive Status: No family/caregiver present to determine baseline cognitive functioning                                 General Comments: Pt following commands but focused on the fact that she urinated in the bed and kept crying over this.  Assisted pt with cleaning and assured her it was ok.      Exercises      General Comments General comments (skin integrity, edema, etc.): On RA VSS      Pertinent Vitals/Pain Pain Assessment: No/denies pain    Home Living                      Prior Function            PT Goals (current goals can now be  found in the care plan section) Progress towards PT goals: Progressing toward goals    Frequency    Min 3X/week      PT Plan Current plan remains appropriate(urination in bed)    Co-evaluation              AM-PAC PT "6 Clicks" Mobility   Outcome Measure  Help needed turning from your back to your side while in a flat bed without using bedrails?: A Little Help needed moving from lying on your back to sitting on the side of a flat bed without using bedrails?: A Little Help needed moving to and from a bed to a chair (including a wheelchair)?: A Little Help needed standing up from a chair using your arms (e.g., wheelchair or bedside chair)?: A Little Help needed to walk in hospital room?: A  Little Help needed climbing 3-5 steps with a railing? : A Lot 6 Click Score: 17    End of Session Equipment Utilized During Treatment: Gait belt Activity Tolerance: Patient limited by fatigue(and anxiety) Patient left: in chair;with call bell/phone within reach;with chair alarm set Nurse Communication: Mobility status PT Visit Diagnosis: Unsteadiness on feet (R26.81);Muscle weakness (generalized) (M62.81)     Time: 3202-3343 PT Time Calculation (min) (ACUTE ONLY): 26 min  Charges:  $Gait Training: 8-22 mins $Therapeutic Activity: 8-22 mins                     Maggie Font, PT Acute Rehab Services Pager [phone] [facility] [phone] [facility] [phone]    Karlton Lemon 06/02/2019, 5:35 PM

## 2019-06-02 NOTE — Progress Notes (Addendum)
Advanced Heart Failure Rounding Note  PCP-Cardiologist: Peter Martinique, MD  AHF: Dr. Haroldine Laws   Subjective:    CMRI-Findings consistent with acute myocarditis. LVEF 11% RVEF 18% (severe biventricular dysfunction)  Inotrope dependent. On Milrinone 0.25. Co-ox 60%. Got  tunneled cath today for home milrinone.    Scr much improved, 2.56>>2.21>>1.86.  Na trending up, 124>>127>>130   Feels ok today. No dyspnea. Planning to go to SNF.     Objective:   Weight Range: 64.1 kg Body mass index is 18.64 kg/m.   Vital Signs:   Temp:  [97.2 F (36.2 C)-98 F (36.7 C)] 97.4 F (36.3 C) (03/10 1000) Pulse Rate:  [93-97] 94 (03/10 1355) Resp:  [10-28] 15 (03/10 1355) BP: (104-129)/(52-80) 122/63 (03/10 1355) SpO2:  [98 %-100 %] 100 % (03/10 1355) Weight:  [64.1 kg] 64.1 kg (03/10 0314) Last BM Date: 06/01/19  Weight change: Filed Weights   05/31/19 0354 06/01/19 0440 06/02/19 0314  Weight: 65 kg 63.6 kg 64.1 kg    Intake/Output:   Intake/Output Summary (Last 24 hours) at 06/02/2019 1423 Last data filed at 06/02/2019 0600 Gross per 24 hour  Intake 141.84 ml  Output 350 ml  Net -208.16 ml      Physical Exam   General:  Thin/cachetic AAF. Looks much older than actual age. No respiratory difficulty HEENT: normal anicteric Neck: supple. no JVD. Carotids 2+ bilat; no bruits. No lymphadenopathy or thryomegaly appreciated. Lt IJ tunneled cath  Cor: PMI nondisplaced. RRR. +s3 Lungs: clear Abdomen: soft, nontender, nondistended. No hepatosplenomegaly. No bruits or masses. Good bowel sounds. Extremities: no cyanosis, clubbing, rash, trace bilateral LEE Neuro: alert & oriented x 3, cranial nerves grossly intact. moves all 4 extremities w/o difficulty. Affect pleasant   Telemetry   NSR 70s-80s Personally reviewed   Labs    CBC Recent Labs    06/01/19 0315 06/02/19 0448  WBC 15.3* 14.1*  HGB 9.9* 9.5*  HCT 27.9* 27.4*  MCV 72.1* 72.9*  PLT 179 003   Basic  Metabolic Panel Recent Labs    06/01/19 0315 06/02/19 0448  NA 127* 130*  K 3.4* 3.6  CL 97* 97*  CO2 21* 21*  GLUCOSE 101* 90  BUN 50* 44*  CREATININE 2.21* 1.86*  CALCIUM 8.2* 8.5*  MG 2.0 2.0   Liver Function Tests Recent Labs    06/01/19 0315  AST 57*  ALT 62*  ALKPHOS 92  BILITOT 1.4*  PROT 4.7*  ALBUMIN 1.9*   No results for input(s): LIPASE, AMYLASE in the last 72 hours. Cardiac Enzymes No results for input(s): CKTOTAL, CKMB, CKMBINDEX, TROPONINI in the last 72 hours.  BNP: BNP (last 3 results) Recent Labs    05/18/19 1707 05/21/19 1206  BNP >4,500.0* 1,490.6*    ProBNP (last 3 results) No results for input(s): PROBNP in the last 8760 hours.   D-Dimer No results for input(s): DDIMER in the last 72 hours. Hemoglobin A1C No results for input(s): HGBA1C in the last 72 hours. Fasting Lipid Panel No results for input(s): CHOL, HDL, LDLCALC, TRIG, CHOLHDL, LDLDIRECT in the last 72 hours. Thyroid Function Tests No results for input(s): TSH, T4TOTAL, T3FREE, THYROIDAB in the last 72 hours.  Invalid input(s): FREET3  Other results:   Imaging    No results found.   Medications:     Scheduled Medications: . amiodarone  200 mg Oral BID  . aspirin  81 mg Oral Daily  . Chlorhexidine Gluconate Cloth  6 each Topical Daily  . clopidogrel  75 mg Oral Daily  . [START ON 06/03/2019] enoxaparin (LOVENOX) injection  30 mg Subcutaneous Q24H  . feeding supplement (ENSURE ENLIVE)  237 mL Oral TID BM  . fluticasone  1 puff Inhalation Daily  . furosemide  40 mg Oral Daily  . gabapentin  200 mg Oral QHS  . isosorbide-hydrALAZINE  1 tablet Oral TID  . lidocaine      . midazolam      . multivitamin with minerals  1 tablet Oral Daily  . potassium chloride  20 mEq Oral BID  . senna  1 tablet Oral Daily    Infusions: . milrinone 0.25 mcg/kg/min (06/02/19 0600)    PRN Medications: acetaminophen, albuterol, cyclobenzaprine, Gerhardt's butt cream,  ondansetron (ZOFRAN) IV, polyethylene glycol, sorbitol    Assessment/Plan   1. Cardiogenic shock with sever biventricular dysfunction - Previous EF 40% in 10/18 - Echo in ER EF 10% with severe RV dysfunction. Moderate to severe AI - cMRI - Findings consistent with acute myocarditis. LVEF 11% RV EF 18%  - initial lactate > 6.7 - initial swan #s w/ low output and elevated filling pressures: CO/CI 1.49/0.84, RA 16, PCWP 26, PAPi 0.74. Co-ox 39% - Suspect PVCs playing a role.  Improved w/ amiodarone. D/c on 200 mg qd - cMRI - Findings consistent with acute myocarditis. LVEF 11% RV reduced., Severe Biventricular Failure. - Remains on milrinone 0.25 mcg. Inotrope dependent. Co-ox  60% today.  - Plan to go to SNF w/ milrinone. Getting tunneled catheter today  - Volume stable. Continue PO Lasix 40 mg daily  - Continue Bidil 1 tablet tid  - ARB, spiro and digoxin on hold for elevated SCr. Scr is gradually improving. Can reasess at outpatient f/u and can add meds slowly if scr continues to improve/ BP stable. - not candidate for mechanical support with PAD and severe AI - Plan transition to SNF w/ milrinone today   2. CAD - s/p CABG with LIMA to LAD and MVR (bioprosthetic) - Trop 407 -> 387->492.  - No s/s ischemia - continue asa, Plavix + statin   3. Moderate to severe AI - BCx NGTD - not a candidate for surgery   4. Lung cancer s/p resection - stable  5. Severe PAD - s/p left fem-pop and L CEA - bilateral RAS s/p stenting - stable   6. AKI - recurrent. Suspect ATN from low BP with infection and HF - Creatinine improving 2.94 -> 2.78->2.56->2.21->1.86 - Continue PO Lasix, 40 mg daily   - Continue milrinone.  - not candidate for HD  7. AMS - resolved. Likely due to UTI/shock  8. Hypokalemia/hyponatremia:  - K 3.6 today.  - Sodium improving, 124>>127>>130. Mentating ok.   9. PVCs - PVCs suppressed. Continue amio 200 mg once daily at d/c - Keep K> 4. Mg > 2.0 - no  b-blocker with shock   10. Urosepsis - treated w/ vanc/meropenem>>>cefepime  - WBC 19k -> 17k->16K->15K->14K  - apparently urine cx not sent -abx per primary team   11. DNR/DNI   Dispo: Plan discharge to SNF on Milrinone.  Stable to go today from CHF standpoint.  Cardiac Meds for Discharge Milrinone 0.25 mcg/kg/min Amiodarone 200 mg once daily  ASA 81 mg qd Plavix 75 mg qd Lasix 40 mg qd Bidil 20-37.5 tid KCl 20 bid   Post hospital f/u arranged in [facility]. Appt info in AVS   Length of Stay: [address] [place]  06/02/2019, 2:23 PM  Advanced Heart Failure Team Pager  [phone] (M-F; 7a - 4p)  Please contact [facility] for night-coverage after hours (4p -7a ) and weekends on [email or web address]   Agree with above.  She remains on milrinone 0.25.  He is alert but remains weak.  co ox is 60%.  Volume status looks good.  She appears to be inotrope dependent due to severe biventricular failure at this time.    She underwent placement of a tunneled left IJ PICC line today for home inotropes.  The right IJ was felt to be occluded proximally this was a similar finding from her admission.  I suspect it is due to her prior right upper chest lung resection.  General:  Weak frail appearing. No resp difficulty HEENT: normal Neck: supple. no JVD. LIJ pICC Carotids 2+ bilat; no bruits. No lymphadenopathy or thryomegaly appreciated. Cor: PMI displaced. Regular rate & rhythm. +s3. Lungs: clear Abdomen: soft, nontender, nondistended. No hepatosplenomegaly. No bruits or masses. Good bowel sounds. Extremities: no cyanosis, clubbing, rash, edema Neuro: alert & orientedx3, cranial nerves grossly intact. moves all 4 extremities w/o difficulty. Affect pleasant   Agree with discharge to SNF on the above medications.  We will follow her in the heart failure clinic.  Hopefully she will have some ventricular recovery from her acute myocarditis.  But overall I worry her prognosis is poor.  I  discussed personally with Dr. Milta Deiters.  Greatly appreciate the support of the FPTS.  Glori Bickers, MD  9:46 PM

## 2019-06-02 NOTE — Progress Notes (Signed)
Hydrologist [facility])  Hospital Liaison: RN note         Notified by [facility] manager of patient/family request for [facility] Palliative services at [facility] after discharge.             [place] Palliative team will follow up with patient at [facility]  after discharge.         Please call with any hospice or palliative related questions.         Thank you for this referral.         Farrel Gordon, RN, CCM  [place] (listed on AMION under Hospice and [place] [facility] [place])    [phone]

## 2019-06-02 NOTE — Progress Notes (Signed)
Family Medicine Teaching Service Daily Progress Note Intern Pager: [phone]  Patient name: Leah [name] [facility] Medical record number: [id number] Date of birth: December 01, 1950 Age: 69 y.o. Gender: female  Primary Care Provider: Guadalupe Dawn, MD Consultants: Heart Failure Code Status: Full   Pt Overview and Major Events to Date:  2/23 admitted in cardiogenic shock to ICU 2/28 transferred to floor  Assessment and Plan: Leah [name] a 69 y.o.femalewho presented with altered mental status, found to be in cardiogenic shock 2/2 viral cardiomyopathy. PMH is significant for CAD, HTN, HLD, PVD, HFpEF (now HFrEF EF 10%), CKD, MV replacement, Hx bronchogenic lung cancer, and tobacco use.  Cardiogenic Shock with biventricular dysfunction, secondary to acute viral myocarditis Feels well this morning, endorses "heavy legs" which pt has felt for a long time. I explained that the HF team have restarted a diuretic which will help drain fluid from her legs.  Vital signs stable, saturating well on room air. In: Total 141cc, Out: 371ml. Weight increased by 0.5kg since yesterdya - Heart failure following, appreciate recs, per last note they do not feel they will be able to wean her off milrinone -Continue bidil 1 tab tid -Continue milrinone 0.57mcg/kg/min -Hold losartan, dig, spiro -Fluid restriction, 1.5L -Flevate legs to help with the swelling -Tylenol prn -PICC line placement today  -Plan for discharge today or tomorrow to SNF  AMS  Leukocytosis, resolving WBC 14.1, 15.3>16.2>17.4>19.9, afebrile. Treating for presumed UTI although there is no culture data yet. Large leuks and many bacteria on UA. Procal 1.2->1.02. Blood culture no growth at 3 days. Urine culture no growth at 2 days Vanc (3/5-3/8), Cefepime (3/5-3/8) -continue to monitor CBC while IP   CAD S/P CABG with LIMA to LAD and MVR (bioprosthetic).  - continue dapt - continue lipitor 80mg , zetia 10mg  - continue amiodarone 200mg   bid - continue holding spiro, bb, lasix  Goals of care Palliative involved. DNR/DNI. Hopeful at recovery and has improved significantly from admission although things remain tenuous.  - palliative following appreciate recs - pt/ot/nutrition - dispo pending clinical course - dnr/dni  Lung cancer s/p resection Currently on RA.  No home O2. - stable, cardiac plan per above  Severe PAD  S/p left femoropopliteal and L CEA - bilateral RAS s/p stenting - continue dapt - cont statin and zetia per above  AKI On CKD: Improving Cr 1.86 today, 2.21>2.56> 2.78>2.94.   Baseline appears to be 1.0-1.3.  2/2 cardiogenic shock.Worsening after Lasix dose 2 days ago. - cont milrinone per above - Avoid nephrotoxic agents  Hypervolemic hyponatremia Na 130, 127>124> [phone] -Continue fluid restriction  -Daily BMP  Frequent PVCs/ Hypokalemia/ Hypomagnesemia Goal to keep K greater than 4 and mag greater than 2.   K 3.6 today, 3.4 on 3/9 Mag 2 today, 2 on 3/9 -Daily BMP and Mg daily -Replete Mag as needed -Continue p.o. amiodarone 200 mg twice daily  Iron Deficiency Anemia Denies any hematochezia or melena. Hgb 9.5 today. 9.9>10.8>11.2>9.9. Admission Hgb was 14.6. MCV 68.9.  Ferritin 120.  Iron 20, Sat 8.   - continue feraheme 510mg  weekly, received last dose on 3/8 - outpatient colonoscopy - transfusion threshold 8 given CAD  FEN/GI: Dysphagia 3 Diet PPx: Lovenox  Disposition:  planning for SNF today   Subjective:  Feels well this morning, endorses "heavy legs" which pt has felt for a long time  Objective: Temp:  [97.2 F (36.2 C)-98 F (36.7 C)] 97.2 F (36.2 C) (03/10 0720) Pulse Rate:  [96-99] 96 (03/09 1911) Resp:  [  10-20] 13 (03/10 0720) BP: (104-124)/(44-80) 104/52 (03/10 0720) SpO2:  [98 %-99 %] 98 % (03/10 0720) Weight:  [64.1 kg] 64.1 kg (03/10 0314)  Physical Exam:   General: Alert and cooperative and appears to be in no acute distress Cardio: Normal S1 and  S2,  RRR, No murmurs or rubs.   Pulm: CTAB, normal WOB  Abdomen: Bowel sounds normal. Abdomen soft and non-tender.  Extremities: Bilateral pitting edema 1+. Warm/ well perfused.  Strong radial pulses.  Neuro: Cranial nerves grossly intact  Laboratory: Recent Labs  Lab 05/31/19 0610 06/01/19 0315 06/02/19 0448  WBC 16.2* 15.3* 14.1*  HGB 10.9* 9.9* 9.5*  HCT 31.4* 27.9* 27.4*  PLT 191 179 171   Recent Labs  Lab 05/31/19 0610 06/01/19 0315 06/02/19 0448  NA 124* 127* 130*  K 4.0 3.4* 3.6  CL 93* 97* 97*  CO2 20* 21* 21*  BUN 54* 50* 44*  CREATININE 2.56* 2.21* 1.86*  CALCIUM 8.1* 8.2* 8.5*  PROT  --  4.7*  --   BILITOT  --  1.4*  --   ALKPHOS  --  92  --   ALT  --  62*  --   AST  --  57*  --   GLUCOSE 179* 101* 90    Imaging/Diagnostic Tests: No results found.  [name], MD 06/02/2019, 7:31 AM PGY-1, [place] Intern pager: [phone], text pages welcome

## 2019-06-03 DIAGNOSIS — R0602 Shortness of breath: Secondary | ICD-10-CM | POA: Diagnosis not present

## 2019-06-03 DIAGNOSIS — E785 Hyperlipidemia, unspecified: Secondary | ICD-10-CM | POA: Diagnosis present

## 2019-06-03 DIAGNOSIS — Z8249 Family history of ischemic heart disease and other diseases of the circulatory system: Secondary | ICD-10-CM | POA: Diagnosis not present

## 2019-06-03 DIAGNOSIS — I5033 Acute on chronic diastolic (congestive) heart failure: Secondary | ICD-10-CM | POA: Diagnosis not present

## 2019-06-03 DIAGNOSIS — I5082 Biventricular heart failure: Secondary | ICD-10-CM | POA: Diagnosis present

## 2019-06-03 DIAGNOSIS — I959 Hypotension, unspecified: Secondary | ICD-10-CM | POA: Diagnosis not present

## 2019-06-03 DIAGNOSIS — R570 Cardiogenic shock: Secondary | ICD-10-CM | POA: Diagnosis not present

## 2019-06-03 DIAGNOSIS — R278 Other lack of coordination: Secondary | ICD-10-CM | POA: Diagnosis not present

## 2019-06-03 DIAGNOSIS — D649 Anemia, unspecified: Secondary | ICD-10-CM | POA: Diagnosis not present

## 2019-06-03 DIAGNOSIS — E871 Hypo-osmolality and hyponatremia: Secondary | ICD-10-CM | POA: Diagnosis present

## 2019-06-03 DIAGNOSIS — R41841 Cognitive communication deficit: Secondary | ICD-10-CM | POA: Diagnosis not present

## 2019-06-03 DIAGNOSIS — N183 Chronic kidney disease, stage 3 unspecified: Secondary | ICD-10-CM | POA: Diagnosis not present

## 2019-06-03 DIAGNOSIS — I080 Rheumatic disorders of both mitral and aortic valves: Secondary | ICD-10-CM | POA: Diagnosis present

## 2019-06-03 DIAGNOSIS — F411 Generalized anxiety disorder: Secondary | ICD-10-CM | POA: Diagnosis not present

## 2019-06-03 DIAGNOSIS — I5043 Acute on chronic combined systolic (congestive) and diastolic (congestive) heart failure: Secondary | ICD-10-CM | POA: Diagnosis present

## 2019-06-03 DIAGNOSIS — N179 Acute kidney failure, unspecified: Secondary | ICD-10-CM | POA: Diagnosis present

## 2019-06-03 DIAGNOSIS — I429 Cardiomyopathy, unspecified: Secondary | ICD-10-CM | POA: Diagnosis not present

## 2019-06-03 DIAGNOSIS — N1832 Chronic kidney disease, stage 3b: Secondary | ICD-10-CM | POA: Diagnosis present

## 2019-06-03 DIAGNOSIS — R609 Edema, unspecified: Secondary | ICD-10-CM | POA: Diagnosis not present

## 2019-06-03 DIAGNOSIS — N1831 Chronic kidney disease, stage 3a: Secondary | ICD-10-CM | POA: Diagnosis not present

## 2019-06-03 DIAGNOSIS — R1312 Dysphagia, oropharyngeal phase: Secondary | ICD-10-CM | POA: Diagnosis not present

## 2019-06-03 DIAGNOSIS — Z20828 Contact with and (suspected) exposure to other viral communicable diseases: Secondary | ICD-10-CM | POA: Diagnosis not present

## 2019-06-03 DIAGNOSIS — I110 Hypertensive heart disease with heart failure: Secondary | ICD-10-CM | POA: Diagnosis not present

## 2019-06-03 DIAGNOSIS — R531 Weakness: Secondary | ICD-10-CM | POA: Diagnosis not present

## 2019-06-03 DIAGNOSIS — E876 Hypokalemia: Secondary | ICD-10-CM | POA: Diagnosis present

## 2019-06-03 DIAGNOSIS — I472 Ventricular tachycardia: Secondary | ICD-10-CM | POA: Diagnosis present

## 2019-06-03 DIAGNOSIS — F17210 Nicotine dependence, cigarettes, uncomplicated: Secondary | ICD-10-CM | POA: Diagnosis present

## 2019-06-03 DIAGNOSIS — M6281 Muscle weakness (generalized): Secondary | ICD-10-CM | POA: Diagnosis not present

## 2019-06-03 DIAGNOSIS — Z681 Body mass index (BMI) 19 or less, adult: Secondary | ICD-10-CM | POA: Diagnosis not present

## 2019-06-03 DIAGNOSIS — D509 Iron deficiency anemia, unspecified: Secondary | ICD-10-CM | POA: Diagnosis not present

## 2019-06-03 DIAGNOSIS — Z7401 Bed confinement status: Secondary | ICD-10-CM | POA: Diagnosis not present

## 2019-06-03 DIAGNOSIS — R2689 Other abnormalities of gait and mobility: Secondary | ICD-10-CM | POA: Diagnosis not present

## 2019-06-03 DIAGNOSIS — I5084 End stage heart failure: Secondary | ICD-10-CM | POA: Diagnosis present

## 2019-06-03 DIAGNOSIS — I739 Peripheral vascular disease, unspecified: Secondary | ICD-10-CM | POA: Diagnosis present

## 2019-06-03 DIAGNOSIS — Z9861 Coronary angioplasty status: Secondary | ICD-10-CM | POA: Diagnosis not present

## 2019-06-03 DIAGNOSIS — Z515 Encounter for palliative care: Secondary | ICD-10-CM | POA: Diagnosis present

## 2019-06-03 DIAGNOSIS — I10 Essential (primary) hypertension: Secondary | ICD-10-CM | POA: Diagnosis not present

## 2019-06-03 DIAGNOSIS — I351 Nonrheumatic aortic (valve) insufficiency: Secondary | ICD-10-CM | POA: Diagnosis not present

## 2019-06-03 DIAGNOSIS — M2550 Pain in unspecified joint: Secondary | ICD-10-CM | POA: Diagnosis not present

## 2019-06-03 DIAGNOSIS — Z952 Presence of prosthetic heart valve: Secondary | ICD-10-CM | POA: Diagnosis not present

## 2019-06-03 DIAGNOSIS — I712 Thoracic aortic aneurysm, without rupture: Secondary | ICD-10-CM | POA: Diagnosis not present

## 2019-06-03 DIAGNOSIS — Z66 Do not resuscitate: Secondary | ICD-10-CM | POA: Diagnosis present

## 2019-06-03 DIAGNOSIS — Z85118 Personal history of other malignant neoplasm of bronchus and lung: Secondary | ICD-10-CM | POA: Diagnosis not present

## 2019-06-03 DIAGNOSIS — E43 Unspecified severe protein-calorie malnutrition: Secondary | ICD-10-CM | POA: Diagnosis not present

## 2019-06-03 DIAGNOSIS — I509 Heart failure, unspecified: Secondary | ICD-10-CM | POA: Diagnosis present

## 2019-06-03 DIAGNOSIS — I493 Ventricular premature depolarization: Secondary | ICD-10-CM | POA: Diagnosis present

## 2019-06-03 DIAGNOSIS — R2681 Unsteadiness on feet: Secondary | ICD-10-CM | POA: Diagnosis not present

## 2019-06-03 DIAGNOSIS — I5023 Acute on chronic systolic (congestive) heart failure: Secondary | ICD-10-CM | POA: Diagnosis not present

## 2019-06-03 DIAGNOSIS — Z20822 Contact with and (suspected) exposure to covid-19: Secondary | ICD-10-CM | POA: Diagnosis present

## 2019-06-03 DIAGNOSIS — I130 Hypertensive heart and chronic kidney disease with heart failure and stage 1 through stage 4 chronic kidney disease, or unspecified chronic kidney disease: Secondary | ICD-10-CM | POA: Diagnosis present

## 2019-06-03 DIAGNOSIS — I5031 Acute diastolic (congestive) heart failure: Secondary | ICD-10-CM | POA: Diagnosis not present

## 2019-06-03 DIAGNOSIS — Z8042 Family history of malignant neoplasm of prostate: Secondary | ICD-10-CM | POA: Diagnosis not present

## 2019-06-03 DIAGNOSIS — I2510 Atherosclerotic heart disease of native coronary artery without angina pectoris: Secondary | ICD-10-CM | POA: Diagnosis present

## 2019-06-03 DIAGNOSIS — Z7189 Other specified counseling: Secondary | ICD-10-CM | POA: Diagnosis not present

## 2019-06-03 DIAGNOSIS — R0600 Dyspnea, unspecified: Secondary | ICD-10-CM | POA: Diagnosis not present

## 2019-06-03 DIAGNOSIS — J449 Chronic obstructive pulmonary disease, unspecified: Secondary | ICD-10-CM | POA: Diagnosis present

## 2019-06-03 DIAGNOSIS — C3491 Malignant neoplasm of unspecified part of right bronchus or lung: Secondary | ICD-10-CM | POA: Diagnosis not present

## 2019-06-03 DIAGNOSIS — R7303 Prediabetes: Secondary | ICD-10-CM | POA: Diagnosis present

## 2019-06-03 LAB — BASIC METABOLIC PANEL
Anion gap: 9 (ref 5–15)
BUN: 42 mg/dL — ABNORMAL HIGH (ref 8–23)
CO2: 21 mmol/L — ABNORMAL LOW (ref 22–32)
Calcium: 8.6 mg/dL — ABNORMAL LOW (ref 8.9–10.3)
Chloride: 100 mmol/L (ref 98–111)
Creatinine, Ser: 1.71 mg/dL — ABNORMAL HIGH (ref 0.44–1.00)
GFR calc Af Amer: 36 mL/min — ABNORMAL LOW (ref >60)
GFR calc non Af Amer: 31 mL/min — ABNORMAL LOW (ref >60)
Glucose, Bld: 121 mg/dL — ABNORMAL HIGH (ref 70–99)
Potassium: 4.1 mmol/L (ref 3.5–5.1)
Sodium: 130 mmol/L — ABNORMAL LOW (ref 135–145)

## 2019-06-03 LAB — COOXEMETRY PANEL
Carboxyhemoglobin: 1 % (ref 0.5–1.5)
Carboxyhemoglobin: 1.7 % — ABNORMAL HIGH (ref 0.5–1.5)
Methemoglobin: 0.6 % (ref 0.0–1.5)
Methemoglobin: 1.3 % (ref 0.0–1.5)
O2 Saturation: 33.2 %
O2 Saturation: 59 %
Total hemoglobin: 9.2 g/dL — ABNORMAL LOW (ref 12.0–16.0)
Total hemoglobin: 9.6 g/dL — ABNORMAL LOW (ref 12.0–16.0)

## 2019-06-03 LAB — MAGNESIUM
Magnesium: 1.9 mg/dL (ref 1.7–2.4)
Magnesium: 1.9 mg/dL (ref 1.7–2.4)

## 2019-06-03 LAB — CULTURE, BLOOD (ROUTINE X 2)
Culture: NO GROWTH
Special Requests: ADEQUATE

## 2019-06-03 LAB — CBC
HCT: 27.7 % — ABNORMAL LOW (ref 36.0–46.0)
Hemoglobin: 9.4 g/dL — ABNORMAL LOW (ref 12.0–15.0)
MCH: 25.5 pg — ABNORMAL LOW (ref 26.0–34.0)
MCHC: 33.9 g/dL (ref 30.0–36.0)
MCV: 75.1 fL — ABNORMAL LOW (ref 80.0–100.0)
Platelets: 174 10*3/uL (ref 150–400)
RBC: 3.69 MIL/uL — ABNORMAL LOW (ref 3.87–5.11)
RDW: 21.4 % — ABNORMAL HIGH (ref 11.5–15.5)
WBC: 12.3 10*3/uL — ABNORMAL HIGH (ref 4.0–10.5)
nRBC: 0 % (ref 0.0–0.2)

## 2019-06-03 MED ORDER — CLOPIDOGREL BISULFATE 75 MG PO TABS: 75.0000 mg | ORAL_TABLET | Freq: Every day | ORAL | 6 refills | Status: AC

## 2019-06-03 MED ORDER — SENNA 8.6 MG PO TABS: 1.0000 | ORAL_TABLET | Freq: Every day | ORAL | 0 refills | Status: AC

## 2019-06-03 MED ORDER — MILRINONE LACTATE IN DEXTROSE 20-5 MG/100ML-% IV SOLN: 0.2500 ug/kg/min | INTRAVENOUS | Status: AC

## 2019-06-03 MED ORDER — ADULT MULTIVITAMIN W/MINERALS CH: 1.0000 | ORAL_TABLET | Freq: Every day | ORAL | Status: AC

## 2019-06-03 MED ORDER — ONDANSETRON HCL 4 MG/2ML IJ SOLN: 4.0000 mg | Freq: Four times a day (QID) | INTRAMUSCULAR | 0 refills | Status: DC | PRN

## 2019-06-03 MED ORDER — ENSURE ENLIVE PO LIQD: 237.0000 mL | Freq: Three times a day (TID) | ORAL | 12 refills | Status: AC

## 2019-06-03 MED ORDER — GABAPENTIN 100 MG PO CAPS: 200.0000 mg | ORAL_CAPSULE | Freq: Every day | ORAL | Status: AC

## 2019-06-03 MED ORDER — ASPIRIN EC 81 MG PO TBEC: 81.0000 mg | DELAYED_RELEASE_TABLET | Freq: Every day | ORAL | 1 refills | Status: AC

## 2019-06-03 MED ORDER — CYCLOBENZAPRINE HCL 10 MG PO TABS: 10.0000 mg | ORAL_TABLET | Freq: Three times a day (TID) | ORAL | 0 refills | Status: AC | PRN

## 2019-06-03 MED ORDER — BIDIL 20-37.5 MG PO TABS: 1.0000 | ORAL_TABLET | Freq: Three times a day (TID) | ORAL | 11 refills | Status: DC

## 2019-06-03 MED ORDER — POTASSIUM CHLORIDE CRYS ER 20 MEQ PO TBCR: 20.0000 meq | EXTENDED_RELEASE_TABLET | Freq: Two times a day (BID) | ORAL | Status: DC

## 2019-06-03 MED ORDER — FUROSEMIDE 40 MG PO TABS: 40.0000 mg | ORAL_TABLET | Freq: Every day | ORAL | Status: DC

## 2019-06-03 MED ORDER — GERHARDT'S BUTT CREAM: 1.0000 "application " | TOPICAL_CREAM | Freq: Every day | CUTANEOUS | Status: AC

## 2019-06-03 MED ORDER — POLYETHYLENE GLYCOL 3350 17 G PO PACK: 17.0000 g | PACK | Freq: Every day | ORAL | 0 refills | Status: AC | PRN

## 2019-06-03 MED ORDER — AMIODARONE HCL 200 MG PO TABS: 200.0000 mg | ORAL_TABLET | Freq: Two times a day (BID) | ORAL | Status: DC

## 2019-06-03 NOTE — Progress Notes (Signed)
Discharged to [facility] by [facility] ambulance. Discharge paper given to ambulance staff. Belongings taken home.

## 2019-06-03 NOTE — Care Management Important Message (Signed)
Important Message  Patient Details  Name: Leah [name] MRN: [id number] Date of Birth: [birth year]   Medicare Important Message Given:  Yes     Memory Argue 06/03/2019, 11:46 AM

## 2019-06-03 NOTE — Progress Notes (Signed)
With on and off nose bleeding in small amount. Ice pack applied.bleeding subsided.

## 2019-06-03 NOTE — Progress Notes (Signed)
Advanced Heart Failure Rounding Note  PCP-Cardiologist: Peter Martinique, MD  AHF: Dr. Haroldine Laws   Subjective:    Remains weak but says she feels a bitt better today.   Objective:   Weight Range: 63.5 kg Body mass index is 18.47 kg/m.   Vital Signs:   Temp:  [97.4 F (36.3 C)-97.9 F (36.6 C)] 97.5 F (36.4 C) (03/11 1056) Pulse Rate:  [94-101] 94 (03/11 1056) Resp:  [14-21] 19 (03/11 0700) BP: (109-119)/(56-62) 109/60 (03/11 1056) SpO2:  [98 %-100 %] 100 % (03/11 1056) Weight:  [63.5 kg] 63.5 kg (03/11 0451) Last BM Date: 06/02/19  Weight change: Filed Weights   06/01/19 0440 06/02/19 0314 06/03/19 0451  Weight: 63.6 kg 64.1 kg 63.5 kg    Intake/Output:   Intake/Output Summary (Last 24 hours) at 06/03/2019 1529 Last data filed at 06/03/2019 1400 Gross per 24 hour  Intake 1133.51 ml  Output -  Net 1133.51 ml      Physical Exam   General:  Thin/cachetic AAF. Looks much older than actual age. No respiratory difficulty HEENT: normal Neck: supple. no JVD. Carotids 2+ bilat; no bruits. No lymphadenopathy or thryomegaly appreciated. LIJ tunneled cath Cor: PMI nondisplaced. Regular rate & rhythm. No rubs, gallops or murmurs. Lungs: clear Abdomen: soft, nontender, nondistended. No hepatosplenomegaly. No bruits or masses. Good bowel sounds. Extremities: no cyanosis, clubbing, rash, edema Neuro: alert & orientedx3, cranial nerves grossly intact. moves all 4 extremities w/o difficulty. Affect pleasant   Telemetry   NSR 80-90s Personally reviewed   Labs    CBC Recent Labs    06/02/19 0448 06/03/19 0834  WBC 14.1* 12.3*  HGB 9.5* 9.4*  HCT 27.4* 27.7*  MCV 72.9* 75.1*  PLT 171 425   Basic Metabolic Panel Recent Labs    06/02/19 0448 06/02/19 0448 06/03/19 0500 06/03/19 0834  NA 130*  --   --  130*  K 3.6  --   --  4.1  CL 97*  --   --  100  CO2 21*  --   --  21*  GLUCOSE 90  --   --  121*  BUN 44*  --   --  42*  CREATININE 1.86*  --   --   1.71*  CALCIUM 8.5*  --   --  8.6*  MG 2.0   < > 1.9 1.9   < > = values in this interval not displayed.   Liver Function Tests Recent Labs    06/01/19 0315  AST 57*  ALT 62*  ALKPHOS 92  BILITOT 1.4*  PROT 4.7*  ALBUMIN 1.9*   No results for input(s): LIPASE, AMYLASE in the last 72 hours. Cardiac Enzymes No results for input(s): CKTOTAL, CKMB, CKMBINDEX, TROPONINI in the last 72 hours.  BNP: BNP (last 3 results) Recent Labs    05/18/19 1707 05/21/19 1206  BNP >4,500.0* 1,490.6*    ProBNP (last 3 results) No results for input(s): PROBNP in the last 8760 hours.   D-Dimer No results for input(s): DDIMER in the last 72 hours. Hemoglobin A1C No results for input(s): HGBA1C in the last 72 hours. Fasting Lipid Panel No results for input(s): CHOL, HDL, LDLCALC, TRIG, CHOLHDL, LDLDIRECT in the last 72 hours. Thyroid Function Tests No results for input(s): TSH, T4TOTAL, T3FREE, THYROIDAB in the last 72 hours.  Invalid input(s): FREET3  Other results:   Imaging    No results found.   Medications:     Scheduled Medications: . amiodarone  200 mg  Oral BID  . aspirin  81 mg Oral Daily  . Chlorhexidine Gluconate Cloth  6 each Topical Daily  . clopidogrel  75 mg Oral Daily  . enoxaparin (LOVENOX) injection  30 mg Subcutaneous Q24H  . feeding supplement (ENSURE ENLIVE)  237 mL Oral TID BM  . fluticasone  1 puff Inhalation Daily  . furosemide  40 mg Oral Daily  . gabapentin  200 mg Oral QHS  . isosorbide-hydrALAZINE  1 tablet Oral TID  . multivitamin with minerals  1 tablet Oral Daily  . senna  1 tablet Oral Daily    Infusions: . milrinone 0.25 mcg/kg/min (06/03/19 1400)    PRN Medications: acetaminophen, albuterol, cyclobenzaprine, Gerhardt's butt cream, ondansetron (ZOFRAN) IV, polyethylene glycol, sorbitol    Assessment/Plan   1. Cardiogenic shock with sever biventricular dysfunction - Previous EF 40% in 10/18 - Echo in ER EF 10% with severe RV  dysfunction. Moderate to severe AI - cMRI - Findings consistent with acute myocarditis. LVEF 11% RV EF 18%  - initial lactate > 6.7 - initial swan #s w/ low output and elevated filling pressures: CO/CI 1.49/0.84, RA 16, PCWP 26, PAPi 0.74. Co-ox 39% - Suspect PVCs playing a role.  Improved w/ amiodarone. D/c on 200 mg qd - cMRI - Findings consistent with acute myocarditis. LVEF 11% RV reduced., Severe Biventricular Failure. - Remains on milrinone 0.25 mcg. Inotrope dependent. Co-ox  59% today.  - Volume looks good . Continue PO Lasix 40 mg daily  - Continue Bidil 1 tablet tid  - Cr improvied to 1.7 - not candidate for mechanical support with PAD and severe AI - Plan transition to SNF w/ milrinone today   2. CAD - s/p CABG with LIMA to LAD and MVR (bioprosthetic) - Trop 407 -> 387->492.  - No s/s ischemia - continue asa, Plavix + statin   3. Moderate to severe AI - BCx NGTD - not a candidate for surgery   4. Lung cancer s/p resection - stable  5. Severe PAD - s/p left fem-pop and L CEA - bilateral RAS s/p stenting - stable   6. AKI - recurrent. Suspect ATN from low BP with infection and HF - Creatinine improving 2.94 -> 2.78->2.56->2.21->1.86 -> 1.71 - Continue PO Lasix, 40 mg daily   - Continue milrinone.  - not candidate for HD  7. AMS - resolved. Likely due to UTI/shock  8. Hypokalemia/hyponatremia:  - K 4.1 today.  - Sodium improving, 124>>127>>130. Mentating ok.   9. PVCs - PVCs suppressed. Continue amio 200 mg once daily at d/c - Keep K> 4. Mg > 2.0 - no b-blocker with shock   10. Urosepsis - treated w/ vanc/meropenem>>>cefepime  - WBC 19k -> 17k->16K->15K->14K -> 12k - abx per primary team   11. DNR/DNI   Dispo: Plan discharge to SNF on Milrinone today. Pull PICC  Cardiac Meds for Discharge Milrinone 0.25 mcg/kg/min Amiodarone 200 mg once daily  ASA 81 mg qd Plavix 75 mg qd Lasix 40 mg qd Bidil 20-37.5 tid KCl 20 bid   Post hospital f/u  arranged in [facility]. Appt info in AVS   Length of Stay: [place], MD  06/03/2019, 3:29 PM  Advanced Heart Failure Team Pager [phone] (M-F; 7a - 4p)  Please contact [facility] for night-coverage after hours (4p -7a ) and weekends on [email or web address]

## 2019-06-03 NOTE — Progress Notes (Signed)
Called [facility] [address] left message about  the late  transport by [facility].

## 2019-06-03 NOTE — Discharge Instructions (Addendum)
Ms. Lucente,  You were admitted with heart failure because of an infection in your heart which is called viral myocarditis.  You have been treated with cardiac medication to help your heart pump more efficiently.  The heart failure team saw you daily when you were in the hospital.  Have discharged you on all the medications that you need and you are on a special medication called milrinone heart failure which we will continue at the skilled nursing facility. You will follow up with cardiology on 29th March.  Please go to the ER if you develop chest pain, shortness of breath or fevers.   We wish you the all the best.  [facility]

## 2019-06-03 NOTE — Progress Notes (Signed)
Report given to staff in [facility].

## 2019-06-03 NOTE — Progress Notes (Signed)
Spoke with Laasya in [facility], that [facility] ambulance will transport pt. between 7pm and 8pm.

## 2019-06-03 NOTE — TOC Transition Note (Signed)
Transition of Care [facility]) - CM/SW Discharge Note   Patient Details  Name: Leah [name] MRN: [id number] Date of Birth: [birth year]  Transition [facility]) CM/SW Contact:  Trula Ore, [place] Phone Number: 06/03/2019, 12:46 PM   Clinical Narrative:     Patient will discharge to [facility]   Anticipated discharge date is 06/03/19.  Family notified [name] (son)  Transport by Corey Harold  Per MD patient ready for DC to [employer] RN, patients, patients family, and facility notified of DC. Discharge summary sent to facility. RN given number for report [phone].   DC packet on chart. Ambulance transport requested for patient. CSW signing off.  Final next level of care: Skilled Nursing Facility Barriers to Discharge: No Barriers Identified   Patient Goals and CMS Choice   CMS Medicare.gov Compare Post Acute Care list provided to:: Patient Represenative (must comment)([name]) Choice offered to / list presented to : Adult Children  Discharge Placement PASRR number recieved: 05/27/19            Patient chooses bed at: [facility] Patient to be transferred to facility by: Ramona Name of family member notified: [name] Patient and family notified of of transfer: 06/03/19  Discharge Plan and Services In-house Referral: NA Discharge Planning Services: CM Consult Post Acute Care Choice: [place]                    HH Arranged: RN, Disease Management, PT [facility] Agency: [place] ([place]) Date [facility] Agency Contacted: 05/21/19 [place]: 1502 Representative spoke with at [place]: [place] ([place]) Interventions     Readmission Risk Interventions Readmission Risk Prevention Plan 05/21/2019  Transportation Screening Complete  PCP or Specialist Appt within 3-5 Days Complete  HRI or [place] Complete  Social Work Consult for [place] Planning/Counseling  Complete  Palliative Care Screening Not Applicable  Medication Review Press photographer) Complete  Some recent data might be hidden

## 2019-06-07 ENCOUNTER — Other Ambulatory Visit: Payer: Self-pay

## 2019-06-07 ENCOUNTER — Encounter: Payer: Self-pay | Admitting: Internal Medicine

## 2019-06-07 ENCOUNTER — Non-Acute Institutional Stay: Payer: Medicare Other | Admitting: Internal Medicine

## 2019-06-07 VITALS — Ht 73.0 in | Wt 141.0 lb

## 2019-06-07 DIAGNOSIS — Z7189 Other specified counseling: Secondary | ICD-10-CM

## 2019-06-07 DIAGNOSIS — Z515 Encounter for palliative care: Secondary | ICD-10-CM

## 2019-06-07 NOTE — Progress Notes (Addendum)
March 15th, 2021 [facility] Palliative Care Consult Note Telephone: [phone]  Fax: [phone]  PATIENT NAME: Leah [name] DOB: 04/09/1950 MRN: [id number] [address] [place], [place] (date of admission 06/03/2019)  PRIMARY CARE PROVIDER:   Guadalupe Dawn, MD [place],  [place] [address]   REFERRING PROVIDER:  Dr. Seward [facility] 06/21/2019 OP colonoscopy  RESPONSIBLE PARTY: (son) [name] [phone]. (brother) [name] [phone]. (brother) [name] [phone].  ASSESSMENT / RECOMMENDATIONS:  1. Advance Care Planning: A. Directives: Addendum (entry 06/09/2019): Spoke to [name], who emphasized that patient is to have full resuscitative efforts in the event of a cardiopulmonary arrest. I called facility SW Lauren to update.  B. Goals of Care: Hoping to gain in strength, be ambulatory, and return home where she lives with her 3 adult children. 2. Cognitive / Functional status: Patient is A & O x 3. She is trying to keep a positive outlook does have times of feeling discouraged. She is pleasantly conversant. She had a stuttering voice, though this greatly improved during the course of conversation. She is working with Physical Therapy, gaining in strength. She can transfer with one person assist. Staff report she is continent of bowel and bladder. Recent weight is 141 lbs. At a height of 6'1" her BMI is 18.6kg/m2.  A & O x 3. She is on continuous oxygen at 2.5 LPM [place]. Patient reports episodes of orthopnea.  3. Family Supports: One son and 3 daughters. Closest to son. Just celebrated birth of a great granddaughter. I plan to call [name] with visit updates. 4. Follow up Palliative Care Visit: within the nxt month.   I spent 60 minutes providing this consultation from 3pm to 4pm. More than 50% of the time in this consultation was spent coordinating  communication.   HISTORY OF PRESENT ILLNESS:  Leah [name] is a 69 y.o. female with h/o  CAD, HTN, HLD, PVD, HFpEF, CKD, MV replacement, bronchogenic lung cancer, and tobacco use.. -2/23-3/01/2020 hospitalized: Acute cardiomyopathy (viral myocarditis) with severe biventricular dysfunction/cardiogenic shock. Discovered prox occlusion (thrombus) of R IJ. Discharged on Milrinone via PICC. Acute on chronic KD. Baseline CR 1-1.3. 2.33 ->1.71 at discharge  Palliative Care was asked to help address goals of care.   CODE STATUS: DNR  PPS: 30% HOSPICE ELIGIBILITY/DIAGNOSIS: TBD  PAST MEDICAL HISTORY:  Past Medical History:  Diagnosis Date  . Anemia   . Bronchogenic lung cancer, right ([place]) 11/29/2016  . CAD (coronary artery disease)    a. s/p LIMA-LAD in 11/2014  . Carotid artery occlusion   . CHF (congestive heart failure) ([place])   . Complication of anesthesia    slow to awaken x 1 maybe 2001  . COPD (chronic obstructive pulmonary disease) ([place])   . GERD (gastroesophageal reflux disease)    "sometimes" takes Copywriter, advertising or drinks gingerale   . Headache(784.0)   . History of kidney stones   . Hyperlipidemia   . Hypertension   . Leg pain   . Peripheral vascular disease ([place])   . Renal vascular disease 10/26/2014   bilateral stents placed   . Severe mitral regurgitation    a. s/p MVR in 11/2014 with a pericardial tissue valve  . Shortness of breath dyspnea   . Tobacco abuse     SOCIAL HX:  Social History   Tobacco Use  . Smoking status: Current Every Day Smoker  Packs/day: 0.50    Years: 35.00    Pack years: 17.50    Types: Cigarettes  . Smokeless tobacco: Never Used  Substance Use Topics  . Alcohol use: Yes    Alcohol/week: 0.0 standard drinks    Comment: occasion    ALLERGIES:  Allergies  Allergen Reactions  . Lisinopril Swelling    Angioedema 06/10/11  . Chantix [Varenicline] Other (See Comments)    Caused insomnia  . Penicillins Hives    Did it involve  swelling of the face/tongue/throat, SOB, or low BP? No Did it involve sudden or severe rash/hives, skin peeling, or any reaction on the inside of your mouth or nose? No Did you need to seek medical attention at a hospital or doctor's office? No When did it last happen?50 Years If all above answers are "NO", may proceed with cephalosporin use.       PERTINENT MEDICATIONS:  Outpatient Encounter Medications as of 06/07/2019  Medication Sig  . albuterol (PROVENTIL HFA;VENTOLIN HFA) 108 (90 Base) MCG/ACT inhaler Inhale 2 puffs into the lungs every 6 (six) hours as needed for wheezing or shortness of breath.  Marland Kitchen amiodarone (PACERONE) 200 MG tablet Take 1 tablet (200 mg total) by mouth 2 (two) times daily.  Marland Kitchen aspirin EC 81 MG tablet Take 1 tablet (81 mg total) by mouth daily.  . clopidogrel (PLAVIX) 75 MG tablet Take 1 tablet (75 mg total) by mouth daily.  . cyclobenzaprine (FLEXERIL) 10 MG tablet Take 1 tablet (10 mg total) by mouth 3 (three) times daily as needed for muscle spasms.  . feeding supplement, ENSURE ENLIVE, (ENSURE ENLIVE) LIQD Take 237 mLs by mouth 3 (three) times daily between meals.  . fluticasone (FLOVENT HFA) 44 MCG/ACT inhaler Inhale 1 puff into the lungs daily.  . furosemide (LASIX) 40 MG tablet Take 1 tablet (40 mg total) by mouth daily.  Marland Kitchen gabapentin (NEURONTIN) 100 MG capsule Take 2 capsules (200 mg total) by mouth at bedtime.  Marland Kitchen Hydrocortisone (GERHARDT'S BUTT CREAM) CREA Apply 1 application topically daily. Apply to affected areas once daly  . isosorbide-hydrALAZINE (BIDIL) 20-37.5 MG tablet Take 1 tablet by mouth 3 (three) times daily.  . milrinone (PRIMACOR) 20 MG/100 ML SOLN infusion Inject 0.0159 mg/min into the vein continuous.  . Multiple Vitamin (MULTIVITAMIN WITH MINERALS) TABS tablet Take 1 tablet by mouth daily.  . polyethylene glycol (MIRALAX / GLYCOLAX) 17 g packet Take 17 g by mouth daily as needed for mild constipation.  . senna (SENOKOT) 8.6 MG TABS  tablet Take 1 tablet (8.6 mg total) by mouth daily.   No facility-administered encounter medications on file as of 06/07/2019.    PHYSICAL EXAM:   BP 112/60, HR 96, RR 16 Stuttering speech that improved as she was relating a story to me  General: NAD, frail appearing, thin, laying recumbent 45 degrees in bed. Cardiovascular: regular rate and rhythm, ventricular gallop.  Pulmonary: clear ant fields Abdomen: soft, nontender, + bowel sounds Extremities: Bilateral LE edema slightly pitting to mid calf, no joint deformities Skin: no rashes Neurological: Weakness but otherwise non-focal. Noted her RLE slightly weaker as c/w L  Julianne Handler, NP

## 2019-06-09 ENCOUNTER — Other Ambulatory Visit: Payer: Self-pay | Admitting: *Deleted

## 2019-06-09 DIAGNOSIS — I739 Peripheral vascular disease, unspecified: Secondary | ICD-10-CM | POA: Diagnosis not present

## 2019-06-09 DIAGNOSIS — R570 Cardiogenic shock: Secondary | ICD-10-CM | POA: Diagnosis not present

## 2019-06-09 DIAGNOSIS — I509 Heart failure, unspecified: Secondary | ICD-10-CM | POA: Diagnosis not present

## 2019-06-09 DIAGNOSIS — I351 Nonrheumatic aortic (valve) insufficiency: Secondary | ICD-10-CM | POA: Diagnosis not present

## 2019-06-09 NOTE — Patient Outreach (Signed)
Screened for potential [facility] Med Care Management needs as a benefit of  [employer] Medicare.  Leah [name] is currently receiving skilled therapy at [facility].   Writer attended telephonic interdisciplinary team meeting to assess for disposition needs and transition plan for resident.   [facility] Palliative also following. Noted PCP is at [place]. This practice has a [facility] Embedded Chronic Care Management team.   Will continue to follow while member resides at [place]. Will communicate with [facility] ECM team.  Will plan outreach to patient/family when appropriate.    Marthenia Rolling, MSN-Ed, RN,BSN [place] Acute Care Coordinator [phone] [facility]) [phone]  (Toll free office)

## 2019-06-13 DIAGNOSIS — I5031 Acute diastolic (congestive) heart failure: Secondary | ICD-10-CM | POA: Diagnosis not present

## 2019-06-13 DIAGNOSIS — I2510 Atherosclerotic heart disease of native coronary artery without angina pectoris: Secondary | ICD-10-CM | POA: Diagnosis not present

## 2019-06-13 DIAGNOSIS — I712 Thoracic aortic aneurysm, without rupture: Secondary | ICD-10-CM | POA: Diagnosis not present

## 2019-06-13 DIAGNOSIS — R570 Cardiogenic shock: Secondary | ICD-10-CM | POA: Diagnosis not present

## 2019-06-13 DIAGNOSIS — N179 Acute kidney failure, unspecified: Secondary | ICD-10-CM | POA: Diagnosis not present

## 2019-06-13 DIAGNOSIS — I10 Essential (primary) hypertension: Secondary | ICD-10-CM | POA: Diagnosis not present

## 2019-06-13 DIAGNOSIS — E785 Hyperlipidemia, unspecified: Secondary | ICD-10-CM | POA: Diagnosis not present

## 2019-06-13 DIAGNOSIS — D649 Anemia, unspecified: Secondary | ICD-10-CM | POA: Diagnosis not present

## 2019-06-13 DIAGNOSIS — E876 Hypokalemia: Secondary | ICD-10-CM | POA: Diagnosis not present

## 2019-06-13 DIAGNOSIS — I739 Peripheral vascular disease, unspecified: Secondary | ICD-10-CM | POA: Diagnosis not present

## 2019-06-13 DIAGNOSIS — I429 Cardiomyopathy, unspecified: Secondary | ICD-10-CM | POA: Diagnosis not present

## 2019-06-13 DIAGNOSIS — I351 Nonrheumatic aortic (valve) insufficiency: Secondary | ICD-10-CM | POA: Diagnosis not present

## 2019-06-18 ENCOUNTER — Other Ambulatory Visit (HOSPITAL_COMMUNITY): Payer: Self-pay | Admitting: Internal Medicine

## 2019-06-21 ENCOUNTER — Inpatient Hospital Stay (HOSPITAL_COMMUNITY): Admit: 2019-06-21 | Payer: Medicare Other

## 2019-06-22 ENCOUNTER — Emergency Department (HOSPITAL_COMMUNITY): Payer: Medicare Other

## 2019-06-22 ENCOUNTER — Other Ambulatory Visit: Payer: Self-pay

## 2019-06-22 ENCOUNTER — Encounter (HOSPITAL_COMMUNITY): Payer: Self-pay | Admitting: Emergency Medicine

## 2019-06-22 ENCOUNTER — Inpatient Hospital Stay (HOSPITAL_COMMUNITY)
Admission: EM | Admit: 2019-06-22 | Discharge: 2019-06-25 | DRG: 291 | Disposition: A | Discharge status: Hospice | Payer: Medicare Other | Attending: Family Medicine | Admitting: Family Medicine

## 2019-06-22 DIAGNOSIS — I130 Hypertensive heart and chronic kidney disease with heart failure and stage 1 through stage 4 chronic kidney disease, or unspecified chronic kidney disease: Secondary | ICD-10-CM | POA: Diagnosis not present

## 2019-06-22 DIAGNOSIS — Z8042 Family history of malignant neoplasm of prostate: Secondary | ICD-10-CM

## 2019-06-22 DIAGNOSIS — N179 Acute kidney failure, unspecified: Secondary | ICD-10-CM | POA: Diagnosis present

## 2019-06-22 DIAGNOSIS — R570 Cardiogenic shock: Secondary | ICD-10-CM | POA: Diagnosis not present

## 2019-06-22 DIAGNOSIS — Z952 Presence of prosthetic heart valve: Secondary | ICD-10-CM

## 2019-06-22 DIAGNOSIS — F411 Generalized anxiety disorder: Secondary | ICD-10-CM

## 2019-06-22 DIAGNOSIS — Z951 Presence of aortocoronary bypass graft: Secondary | ICD-10-CM

## 2019-06-22 DIAGNOSIS — Z515 Encounter for palliative care: Secondary | ICD-10-CM | POA: Diagnosis not present

## 2019-06-22 DIAGNOSIS — Z20822 Contact with and (suspected) exposure to covid-19: Secondary | ICD-10-CM | POA: Diagnosis present

## 2019-06-22 DIAGNOSIS — N1832 Chronic kidney disease, stage 3b: Secondary | ICD-10-CM | POA: Diagnosis not present

## 2019-06-22 DIAGNOSIS — R0600 Dyspnea, unspecified: Secondary | ICD-10-CM

## 2019-06-22 DIAGNOSIS — E876 Hypokalemia: Secondary | ICD-10-CM | POA: Diagnosis present

## 2019-06-22 DIAGNOSIS — I2510 Atherosclerotic heart disease of native coronary artery without angina pectoris: Secondary | ICD-10-CM | POA: Diagnosis present

## 2019-06-22 DIAGNOSIS — R7303 Prediabetes: Secondary | ICD-10-CM | POA: Diagnosis present

## 2019-06-22 DIAGNOSIS — I472 Ventricular tachycardia: Secondary | ICD-10-CM | POA: Diagnosis present

## 2019-06-22 DIAGNOSIS — I5023 Acute on chronic systolic (congestive) heart failure: Secondary | ICD-10-CM | POA: Diagnosis not present

## 2019-06-22 DIAGNOSIS — Z7982 Long term (current) use of aspirin: Secondary | ICD-10-CM

## 2019-06-22 DIAGNOSIS — Z8249 Family history of ischemic heart disease and other diseases of the circulatory system: Secondary | ICD-10-CM

## 2019-06-22 DIAGNOSIS — I739 Peripheral vascular disease, unspecified: Secondary | ICD-10-CM | POA: Diagnosis present

## 2019-06-22 DIAGNOSIS — I5084 End stage heart failure: Secondary | ICD-10-CM | POA: Diagnosis present

## 2019-06-22 DIAGNOSIS — I110 Hypertensive heart disease with heart failure: Secondary | ICD-10-CM | POA: Diagnosis not present

## 2019-06-22 DIAGNOSIS — I5033 Acute on chronic diastolic (congestive) heart failure: Secondary | ICD-10-CM | POA: Diagnosis not present

## 2019-06-22 DIAGNOSIS — I5082 Biventricular heart failure: Secondary | ICD-10-CM | POA: Diagnosis present

## 2019-06-22 DIAGNOSIS — I080 Rheumatic disorders of both mitral and aortic valves: Secondary | ICD-10-CM | POA: Diagnosis present

## 2019-06-22 DIAGNOSIS — I509 Heart failure, unspecified: Secondary | ICD-10-CM

## 2019-06-22 DIAGNOSIS — I493 Ventricular premature depolarization: Secondary | ICD-10-CM | POA: Diagnosis present

## 2019-06-22 DIAGNOSIS — F17210 Nicotine dependence, cigarettes, uncomplicated: Secondary | ICD-10-CM | POA: Diagnosis present

## 2019-06-22 DIAGNOSIS — Z841 Family history of disorders of kidney and ureter: Secondary | ICD-10-CM

## 2019-06-22 DIAGNOSIS — I5043 Acute on chronic combined systolic (congestive) and diastolic (congestive) heart failure: Secondary | ICD-10-CM | POA: Diagnosis present

## 2019-06-22 DIAGNOSIS — Z85118 Personal history of other malignant neoplasm of bronchus and lung: Secondary | ICD-10-CM

## 2019-06-22 DIAGNOSIS — Z66 Do not resuscitate: Secondary | ICD-10-CM | POA: Diagnosis not present

## 2019-06-22 DIAGNOSIS — J449 Chronic obstructive pulmonary disease, unspecified: Secondary | ICD-10-CM | POA: Diagnosis present

## 2019-06-22 DIAGNOSIS — Z7902 Long term (current) use of antithrombotics/antiplatelets: Secondary | ICD-10-CM

## 2019-06-22 DIAGNOSIS — E785 Hyperlipidemia, unspecified: Secondary | ICD-10-CM | POA: Diagnosis present

## 2019-06-22 DIAGNOSIS — R0602 Shortness of breath: Secondary | ICD-10-CM | POA: Diagnosis not present

## 2019-06-22 DIAGNOSIS — Z681 Body mass index (BMI) 19 or less, adult: Secondary | ICD-10-CM

## 2019-06-22 DIAGNOSIS — Z9861 Coronary angioplasty status: Secondary | ICD-10-CM

## 2019-06-22 DIAGNOSIS — Z7901 Long term (current) use of anticoagulants: Secondary | ICD-10-CM

## 2019-06-22 DIAGNOSIS — N183 Chronic kidney disease, stage 3 unspecified: Secondary | ICD-10-CM

## 2019-06-22 DIAGNOSIS — E871 Hypo-osmolality and hyponatremia: Secondary | ICD-10-CM | POA: Diagnosis present

## 2019-06-22 DIAGNOSIS — Z79899 Other long term (current) drug therapy: Secondary | ICD-10-CM

## 2019-06-22 DIAGNOSIS — I351 Nonrheumatic aortic (valve) insufficiency: Secondary | ICD-10-CM | POA: Diagnosis present

## 2019-06-22 LAB — COMPREHENSIVE METABOLIC PANEL
ALT: 40 U/L (ref 0–44)
AST: 27 U/L (ref 15–41)
Albumin: 3 g/dL — ABNORMAL LOW (ref 3.5–5.0)
Alkaline Phosphatase: 118 U/L (ref 38–126)
Anion gap: 13 (ref 5–15)
BUN: 34 mg/dL — ABNORMAL HIGH (ref 8–23)
CO2: 18 mmol/L — ABNORMAL LOW (ref 22–32)
Calcium: 9.3 mg/dL (ref 8.9–10.3)
Chloride: 107 mmol/L (ref 98–111)
Creatinine, Ser: 2.04 mg/dL — ABNORMAL HIGH (ref 0.44–1.00)
GFR calc Af Amer: 29 mL/min — ABNORMAL LOW (ref >60)
GFR calc non Af Amer: 25 mL/min — ABNORMAL LOW (ref >60)
Glucose, Bld: 96 mg/dL (ref 70–99)
Potassium: 4 mmol/L (ref 3.5–5.1)
Sodium: 138 mmol/L (ref 135–145)
Total Bilirubin: 1.7 mg/dL — ABNORMAL HIGH (ref 0.3–1.2)
Total Protein: 5.9 g/dL — ABNORMAL LOW (ref 6.5–8.1)

## 2019-06-22 LAB — CBC WITH DIFFERENTIAL/PLATELET
Abs Immature Granulocytes: 0.1 10*3/uL — ABNORMAL HIGH (ref 0.00–0.07)
Basophils Absolute: 0 10*3/uL (ref 0.0–0.1)
Basophils Relative: 0 %
Eosinophils Absolute: 0 10*3/uL (ref 0.0–0.5)
Eosinophils Relative: 0 %
HCT: 33.3 % — ABNORMAL LOW (ref 36.0–46.0)
Hemoglobin: 10.4 g/dL — ABNORMAL LOW (ref 12.0–15.0)
Immature Granulocytes: 1 %
Lymphocytes Relative: 7 %
Lymphs Abs: 0.8 10*3/uL (ref 0.7–4.0)
MCH: 26.2 pg (ref 26.0–34.0)
MCHC: 31.2 g/dL (ref 30.0–36.0)
MCV: 83.9 fL (ref 80.0–100.0)
Monocytes Absolute: 0.6 10*3/uL (ref 0.1–1.0)
Monocytes Relative: 5 %
Neutro Abs: 10.2 10*3/uL — ABNORMAL HIGH (ref 1.7–7.7)
Neutrophils Relative %: 87 %
Platelets: 135 10*3/uL — ABNORMAL LOW (ref 150–400)
RBC: 3.97 MIL/uL (ref 3.87–5.11)
RDW: 26.5 % — ABNORMAL HIGH (ref 11.5–15.5)
WBC: 11.7 10*3/uL — ABNORMAL HIGH (ref 4.0–10.5)
nRBC: 0.9 % — ABNORMAL HIGH (ref 0.0–0.2)

## 2019-06-22 LAB — CBC
HCT: 33.5 % — ABNORMAL LOW (ref 36.0–46.0)
Hemoglobin: 10.7 g/dL — ABNORMAL LOW (ref 12.0–15.0)
MCH: 26.4 pg (ref 26.0–34.0)
MCHC: 31.9 g/dL (ref 30.0–36.0)
MCV: 82.7 fL (ref 80.0–100.0)
Platelets: 129 10*3/uL — ABNORMAL LOW (ref 150–400)
RBC: 4.05 MIL/uL (ref 3.87–5.11)
RDW: 26.4 % — ABNORMAL HIGH (ref 11.5–15.5)
WBC: 12.9 10*3/uL — ABNORMAL HIGH (ref 4.0–10.5)
nRBC: 0.9 % — ABNORMAL HIGH (ref 0.0–0.2)

## 2019-06-22 LAB — TROPONIN I (HIGH SENSITIVITY)
Troponin I (High Sensitivity): 132 ng/L (ref <18)
Troponin I (High Sensitivity): 140 ng/L (ref <18)

## 2019-06-22 LAB — SARS CORONAVIRUS 2 (TAT 6-24 HRS): SARS Coronavirus 2: NEGATIVE

## 2019-06-22 LAB — LIPASE, BLOOD: Lipase: 15 U/L (ref 11–51)

## 2019-06-22 LAB — BRAIN NATRIURETIC PEPTIDE: B Natriuretic Peptide: >4500 pg/mL — ABNORMAL HIGH (ref 0.0–100.0)

## 2019-06-22 LAB — CREATININE, SERUM
Creatinine, Ser: 1.88 mg/dL — ABNORMAL HIGH (ref 0.44–1.00)
GFR calc Af Amer: 32 mL/min — ABNORMAL LOW (ref >60)
GFR calc non Af Amer: 27 mL/min — ABNORMAL LOW (ref >60)

## 2019-06-22 MED ORDER — NICOTINE 14 MG/24HR TD PT24
14.0000 mg | MEDICATED_PATCH | Freq: Every day | TRANSDERMAL | Status: DC
  Administered 2019-06-22 – 2019-06-24 (×3): 14 mg via TRANSDERMAL
  Filled 2019-06-22 (×4): qty 1

## 2019-06-22 MED ORDER — CLOPIDOGREL BISULFATE 75 MG PO TABS
75.0000 mg | ORAL_TABLET | Freq: Every day | ORAL | Status: DC
  Administered 2019-06-22 – 2019-06-25 (×4): 75 mg via ORAL
  Filled 2019-06-22 (×4): qty 1

## 2019-06-22 MED ORDER — MILRINONE LACTATE IN DEXTROSE 20-5 MG/100ML-% IV SOLN
0.2500 ug/kg/min | INTRAVENOUS | Status: DC
  Administered 2019-06-22 – 2019-06-25 (×5): 0.25 ug/kg/min via INTRAVENOUS
  Filled 2019-06-22 (×6): qty 100

## 2019-06-22 MED ORDER — GABAPENTIN 100 MG PO CAPS
200.0000 mg | ORAL_CAPSULE | Freq: Every day | ORAL | Status: DC
  Administered 2019-06-22 – 2019-06-24 (×3): 200 mg via ORAL
  Filled 2019-06-22 (×3): qty 2

## 2019-06-22 MED ORDER — ISOSORB DINITRATE-HYDRALAZINE 20-37.5 MG PO TABS
1.0000 | ORAL_TABLET | Freq: Three times a day (TID) | ORAL | Status: DC
  Administered 2019-06-22 – 2019-06-25 (×10): 1 via ORAL
  Filled 2019-06-22 (×12): qty 1

## 2019-06-22 MED ORDER — GERHARDT'S BUTT CREAM
1.0000 "application " | TOPICAL_CREAM | Freq: Every day | CUTANEOUS | Status: DC
  Administered 2019-06-23 – 2019-06-25 (×3): 1 via TOPICAL
  Filled 2019-06-22 (×2): qty 1

## 2019-06-22 MED ORDER — ENSURE ENLIVE PO LIQD
237.0000 mL | Freq: Three times a day (TID) | ORAL | Status: DC
  Administered 2019-06-23 – 2019-06-24 (×6): 237 mL via ORAL

## 2019-06-22 MED ORDER — HEPARIN SODIUM (PORCINE) 5000 UNIT/ML IJ SOLN
5000.0000 [IU] | Freq: Three times a day (TID) | INTRAMUSCULAR | Status: DC
  Administered 2019-06-22 – 2019-06-25 (×9): 5000 [IU] via SUBCUTANEOUS
  Filled 2019-06-22 (×9): qty 1

## 2019-06-22 MED ORDER — ASPIRIN EC 81 MG PO TBEC
81.0000 mg | DELAYED_RELEASE_TABLET | Freq: Every day | ORAL | Status: DC
  Administered 2019-06-22 – 2019-06-25 (×4): 81 mg via ORAL
  Filled 2019-06-22 (×4): qty 1

## 2019-06-22 MED ORDER — POLYETHYLENE GLYCOL 3350 17 G PO PACK: 17.0000 g | PACK | Freq: Every day | ORAL | Status: DC | PRN

## 2019-06-22 MED ORDER — FLUTICASONE PROPIONATE HFA 44 MCG/ACT IN AERO: 1.0000 | INHALATION_SPRAY | Freq: Every day | RESPIRATORY_TRACT | Status: DC

## 2019-06-22 MED ORDER — AMIODARONE HCL 200 MG PO TABS
200.0000 mg | ORAL_TABLET | Freq: Two times a day (BID) | ORAL | Status: DC
  Administered 2019-06-22 – 2019-06-25 (×7): 200 mg via ORAL
  Filled 2019-06-22 (×7): qty 1

## 2019-06-22 MED ORDER — POTASSIUM CHLORIDE CRYS ER 20 MEQ PO TBCR
20.0000 meq | EXTENDED_RELEASE_TABLET | Freq: Two times a day (BID) | ORAL | Status: DC
  Administered 2019-06-22 (×2): 20 meq via ORAL
  Filled 2019-06-22 (×2): qty 1

## 2019-06-22 MED ORDER — SENNA 8.6 MG PO TABS
1.0000 | ORAL_TABLET | Freq: Every day | ORAL | Status: DC
  Administered 2019-06-22 – 2019-06-25 (×4): 8.6 mg via ORAL
  Filled 2019-06-22 (×4): qty 1

## 2019-06-22 MED ORDER — BUDESONIDE 0.25 MG/2ML IN SUSP
0.2500 mg | Freq: Two times a day (BID) | RESPIRATORY_TRACT | Status: DC
  Administered 2019-06-23 – 2019-06-25 (×5): 0.25 mg via RESPIRATORY_TRACT
  Filled 2019-06-22 (×6): qty 2

## 2019-06-22 MED ORDER — FUROSEMIDE 10 MG/ML IJ SOLN
80.0000 mg | Freq: Two times a day (BID) | INTRAMUSCULAR | Status: DC
  Administered 2019-06-22 – 2019-06-24 (×4): 80 mg via INTRAVENOUS
  Filled 2019-06-22 (×5): qty 8

## 2019-06-22 MED ORDER — ALBUTEROL SULFATE (2.5 MG/3ML) 0.083% IN NEBU: 3.0000 mL | INHALATION_SOLUTION | Freq: Four times a day (QID) | RESPIRATORY_TRACT | Status: DC | PRN

## 2019-06-22 NOTE — Progress Notes (Signed)
Pt had 25 beat run of vtach. Pt lying in bed resting. Asymptomatic. On call provider notified.

## 2019-06-22 NOTE — Consult Note (Addendum)
Advanced Heart Failure Team Consult Note   Primary Physician: Guadalupe Dawn, MD PCP-Cardiologist:  Peter Martinique, MD  Reason for Consultation: A/C Systolic Heart Failure   HPI:    Leah [name] is seen today for evaluation of A/C systolic heart failure at the request of Dr Melina Copa.   Leah [name] is a 69 year old with history of CAD (CABG 2016 s/p LIMA to LAD in 11/2014), MVR 11/2014 with pericardial tissue valve, HTN, HLD, COPD, tobaco use, lung adenocarcinoma stage 1 s/p resection, and PVD (s/p L CEA and R external iliac stenting.   Admitted in February of this year with AMS and prolonged hospitalization. Hospital course complicated by cardiogenic shock + urosepsis. ECHO completed showed severely reduced LVEF 10% with severe RV dysfunction. Placed on milribone and diuresed with IV lasix. Unable to wean milrinone so she was discharged to SNF on milrinone 0.25 mcg. Palliative Care consulted and she elected DNR and no dialysis.   Today she presented to [facility] via EMS. Milrinone tubing at SNF was torn. She is unsure wah happened. Says after tubing was torn she scooted out of bed o the floor and as trying to tell the staff. Presents with increased shortness of breath and lower extremity edema. CXR showed central line with tip over SVC, cardiomegaly, and no infiltrates. Pertinent admission labs included: BNP > 4500,  Creatinine 2.04, K 4, HS Trop 132> 140, WBC 11.7, and hgb 10.4.   Milrinone 0.25 mcg started in the ED.  CMRI 05/2019 -Findings consistent with acute myocarditis. LVEF 11% RVEF 18% (severe biventricular dysfunction)  Review of Systems: [y] = yes, [ ]  = no   . General: Weight gain [ ] ; Weight loss [ ] ; Anorexia [ ] ; Fatigue [ Y]; Fever [ ] ; Chills [ ] ; Weakness [Y ]  . Cardiac: Chest pain/pressure [ ] ; Resting SOB Blue.Reese ]; Exertional SOB [ Y]; Orthopnea [ ] ; Pedal Edema [Y ]; Palpitations [ ] ; Syncope [ ] ; Presyncope [ ] ; Paroxysmal nocturnal dyspnea[ ]   . Pulmonary: Cough [ ] ;  Wheezing[ ] ; Hemoptysis[ ] ; Sputum [ ] ; Snoring [ ]   . GI: Vomiting[ ] ; Dysphagia[ ] ; Melena[ ] ; Hematochezia [ ] ; Heartburn[ ] ; Abdominal pain [ ] ; Constipation [ ] ; Diarrhea [ ] ; BRBPR [ ]   . GU: Hematuria[ ] ; Dysuria [ ] ; Nocturia[ ]   . Vascular: Pain in legs with walking [ ] ; Pain in feet with lying flat [ ] ; Non-healing sores [ ] ; Stroke [ ] ; TIA [ ] ; Slurred speech [ ] ;  . Neuro: Headaches[ ] ; Vertigo[ ] ; Seizures[ ] ; Paresthesias[ ] ;Blurred vision [ ] ; Diplopia [ ] ; Vision changes [ ]   . Ortho/Skin: Arthritis [ ] ; Joint pain [ Y]; Muscle pain [ ] ; Joint swelling [ ] ; Back Pain [Y ]; Rash [ ]   . Psych: Depression[ ] ; Anxiety[ ]   . Heme: Bleeding problems [ ] ; Clotting disorders [ ] ; Anemia [ ]   . Endocrine: Diabetes [ ] ; Thyroid dysfunction[ ]   Home Medications Prior to Admission medications   Medication Sig Start Date End Date Taking? Authorizing Provider  albuterol (PROVENTIL HFA;VENTOLIN HFA) 108 (90 Base) MCG/ACT inhaler Inhale 2 puffs into the lungs every 6 (six) hours as needed for wheezing or shortness of breath. 02/20/16  Yes Archie Patten, MD  amiodarone (PACERONE) 200 MG tablet Take 1 tablet (200 mg total) by mouth 2 (two) times daily. 06/03/19  Yes [name], MD  aspirin EC 81 MG tablet Take 1 tablet (81 mg total) by mouth daily. 06/03/19  Yes [name], MD  clopidogrel (PLAVIX) 75 MG tablet Take 1 tablet (75 mg total) by mouth daily. 06/03/19  Yes [name], MD  cyclobenzaprine (FLEXERIL) 10 MG tablet Take 1 tablet (10 mg total) by mouth 3 (three) times daily as needed for muscle spasms. 06/03/19  Yes [name], MD  docusate sodium (COLACE) 100 MG capsule Take 100 mg by mouth daily.   Yes [provider]  fluticasone (FLOVENT HFA) 44 MCG/ACT inhaler Inhale 1 puff into the lungs daily. 04/12/19  Yes Guadalupe Dawn, MD  furosemide (LASIX) 40 MG tablet Take 1 tablet (40 mg total) by mouth daily. 06/03/19  Yes [name], MD  gabapentin (NEURONTIN) 100 MG  capsule Take 2 capsules (200 mg total) by mouth at bedtime. 06/03/19  Yes [name], MD  Hydrocortisone (GERHARDT'S BUTT CREAM) CREA Apply 1 application topically daily. Apply to affected areas once daly 06/03/19  Yes [name], MD  isosorbide-hydrALAZINE (BIDIL) 20-37.5 MG tablet Take 1 tablet by mouth 3 (three) times daily. 06/03/19  Yes [name], MD  milrinone (PRIMACOR) 20 MG/100 ML SOLN infusion Inject 0.0159 mg/min into the vein continuous. 06/03/19  Yes [name], MD  Multiple Vitamins-Minerals (CEROVITE SENIOR) TABS Take 1 tablet by mouth daily.   Yes [provider]  Nutritional Supplement LIQD Take 120 mLs by mouth 2 (two) times daily. MEDPASS   Yes [provider]  OXYGEN Inhale 2 L into the lungs continuous.   Yes [provider]  polyethylene glycol (MIRALAX / GLYCOLAX) 17 g packet Take 17 g by mouth daily as needed for mild constipation. 06/03/19  Yes [name], MD  potassium chloride SA (KLOR-CON) 20 MEQ tablet Take 20 mEq by mouth 2 (two) times daily.   Yes [provider]  senna (SENOKOT) 8.6 MG TABS tablet Take 1 tablet (8.6 mg total) by mouth daily. 06/03/19  Yes [name], MD  feeding supplement, ENSURE ENLIVE, (ENSURE ENLIVE) LIQD Take 237 mLs by mouth 3 (three) times daily between meals. 06/03/19   [name], MD  Multiple Vitamin (MULTIVITAMIN WITH MINERALS) TABS tablet Take 1 tablet by mouth daily. Patient not taking: Reported on 06/22/2019 06/03/19   [name], MD    Past Medical History: Past Medical History:  Diagnosis Date  . Anemia   . Bronchogenic lung cancer, right ([place]) 11/29/2016  . CAD (coronary artery disease)    a. s/p LIMA-LAD in 11/2014  . Carotid artery occlusion   . CHF (congestive heart failure) ([place])   . Complication of anesthesia    slow to awaken x 1 maybe 2001  . COPD (chronic obstructive pulmonary disease) ([place])   . GERD (gastroesophageal reflux disease)    "sometimes" takes Copywriter, advertising  or drinks gingerale   . Headache(784.0)   . History of kidney stones   . Hyperlipidemia   . Hypertension   . Leg pain   . Peripheral vascular disease ([place])   . Renal vascular disease 10/26/2014   bilateral stents placed   . Severe mitral regurgitation    a. s/p MVR in 11/2014 with a pericardial tissue valve  . Shortness of breath dyspnea   . Tobacco abuse     Past Surgical History: Past Surgical History:  Procedure Laterality Date  . ABDOMINAL AORTOGRAM W/LOWER EXTREMITY Left 09/16/2018   Procedure: ABDOMINAL AORTOGRAM W/LOWER EXTREMITY;  Surgeon: Marty Heck, MD;  Location: [place] [facility];  Service: Cardiovascular;  Laterality: Left;  . ABDOMINAL HYSTERECTOMY    . ANGIOPLASTY /  STENTING ILIAC  2010   right external iliac by Dr. Irish Lack  . CARDIAC CATHETERIZATION  11/13/11   Left Heart Cath. with Coronary Angiogram  . CARDIAC CATHETERIZATION N/A 08/24/2014   Procedure: Left Heart Cath and Coronary Angiography;  Surgeon: Wellington Hampshire, MD;  Location: [place] [facility];  Service: Cardiovascular;  Laterality: N/A;  . CARDIAC CATHETERIZATION N/A 10/26/2014   Procedure: Right/Left Heart Cath and Coronary Angiography;  Surgeon: Peter M Martinique, MD; oLAD 70%, mLAD 70% ISR, D2 30%, OFC 30%, RCA 20%, EF nl, low R heart pressures after diuresis, severe MR  . CAROTID ENDARTERECTOMY  11/21/2007   left  . COLONOSCOPY W/ POLYPECTOMY    . CORONARY ANGIOPLASTY WITH STENT PLACEMENT  6/09   LAD 2.5x12 Promus  . CORONARY ARTERY BYPASS GRAFT N/A 12/05/2014   Procedure: CORONARY ARTERY BYPASS GRAFTING (CABG);  Surgeon: [name], MD;  Location: [place];  Service: Open Heart Surgery;  Laterality: N/A;  Times 1 using left internal mammary artery to LAD  . FEMORAL-POPLITEAL BYPASS GRAFT  10/12   left Dr. Kellie Simmering  . IR FLUORO GUIDE CV LINE LEFT  06/02/2019  . IR US GUIDE VASC ACCESS LEFT  06/02/2019  . LOWER EXTREMITY ANGIOGRAPHY N/A 02/04/2018   Procedure: LOWER EXTREMITY ANGIOGRAPHY;   Surgeon: Marty Heck, MD;  Location: [place] [facility];  Service: Cardiovascular;  Laterality: N/A;  . MITRAL VALVE REPAIR  2016  . MITRAL VALVE REPLACEMENT N/A 12/05/2014   Procedure: MITRAL VALVE (MV) REPLACEMENT;  Surgeon: [name], MD;  Location: [place];  Service: Open Heart Surgery;  Laterality: N/A;  Closure left atrial appendage  . PERIPHERAL VASCULAR BALLOON ANGIOPLASTY Bilateral 09/16/2018   Procedure: PERIPHERAL VASCULAR BALLOON ANGIOPLASTY;  Surgeon: Marty Heck, MD;  Location: [place] [facility];  Service: Cardiovascular;  Laterality: Bilateral;  bilateral renal arteries, Left external iliac  . PERIPHERAL VASCULAR INTERVENTION Left 02/04/2018   Procedure: PERIPHERAL VASCULAR INTERVENTION;  Surgeon: Marty Heck, MD;  Location: [place] [facility];  Service: Cardiovascular;  Laterality: Left;  external iliac  . RENAL ANGIOGRAPHY Bilateral 09/16/2018   Procedure: RENAL ANGIOGRAPHY;  Surgeon: Marty Heck, MD;  Location: [place] [facility];  Service: Cardiovascular;  Laterality: Bilateral;  . RENAL ARTERY STENT Bilateral   . TEE WITHOUT CARDIOVERSION N/A 10/24/2014   Procedure: TRANSESOPHAGEAL ECHOCARDIOGRAM (TEE);  Surgeon: Thayer Headings, MD;  Location: [place];  Service: Cardiovascular;  Laterality: N/A;  . TEE WITHOUT CARDIOVERSION N/A 12/05/2014   Procedure: TRANSESOPHAGEAL ECHOCARDIOGRAM (TEE);  Surgeon: [name], MD;  Location: [place];  Service: Open Heart Surgery;  Laterality: N/A;  . VIDEO ASSISTED THORACOSCOPY (VATS)/WEDGE RESECTION Right 11/29/2016   Procedure: VIDEO ASSISTED THORACOSCOPY (VATS)/ RUL WEDGE RESECTION OF LESION/ NODE SAMPLING;  Surgeon: [name], MD;  Location: [place];  Service: Thoracic;  Laterality: Right;  Marland Kitchen VIDEO BRONCHOSCOPY N/A 11/29/2016   Procedure: VIDEO BRONCHOSCOPY;  Surgeon: [name], MD;  Location: [facility] OR;  Service: Thoracic;  Laterality: N/A;    Family History: Family History    Problem Relation Age of Onset  . Cancer Mother        BRAIN AND LUNG  . Hypertension Mother   . Hypertension Father   . Heart disease Father   . Prostate cancer Father   . Kidney disease Father   . Hypertension Brother   . ALS Brother   . Stroke Paternal Aunt        great aunt  .  Heart attack Neg Hx   . Colon cancer Neg Hx   . Stomach cancer Neg Hx   . Rectal cancer Neg Hx   . Esophageal cancer Neg Hx   . Liver cancer Neg Hx     Social History: Social History   Socioeconomic History  . Marital status: Divorced    Spouse name: Not on file  . Number of children: 4  . Years of education: Not on file  . Highest education level: Not on file  Occupational History  . Occupation: retired  Tobacco Use  . Smoking status: Current Every Day Smoker    Packs/day: 0.50    Years: 35.00    Pack years: 17.50    Types: Cigarettes  . Smokeless tobacco: Never Used  Substance and Sexual Activity  . Alcohol use: Yes    Alcohol/week: 0.0 standard drinks    Comment: occasion  . Drug use: No  . Sexual activity: Never  Other Topics Concern  . Not on file  Social History Narrative  . Not on file   Social Determinants of Health   Financial Resource Strain:   . Difficulty of Paying Living Expenses:   Food Insecurity:   . Worried About Charity fundraiser in the Last Year:   . Arboriculturist in the Last Year:   Transportation Needs:   . Film/video editor (Medical):   Marland Kitchen Lack of Transportation (Non-Medical):   Physical Activity:   . Days of Exercise per Week:   . Minutes of Exercise per Session:   Stress:   . Feeling of Stress :   Social Connections:   . Frequency of Communication with Friends and Family:   . Frequency of Social Gatherings with Friends and Family:   . Attends Religious Services:   . Active Member of Clubs or Organizations:   . Attends Archivist Meetings:   Marland Kitchen Marital Status:     Allergies:  Allergies  Allergen Reactions  . Lisinopril  Swelling    Angioedema 06/10/11  . Chantix [Varenicline] Other (See Comments)    Caused insomnia  . Penicillins Hives    Did it involve swelling of the face/tongue/throat, SOB, or low BP? No Did it involve sudden or severe rash/hives, skin peeling, or any reaction on the inside of your mouth or nose? No Did you need to seek medical attention at a hospital or doctor's office? No When did it last happen?50 Years If all above answers are "NO", may proceed with cephalosporin use.      Objective:    Vital Signs:   Temp:  [98.1 F (36.7 C)] 98.1 F (36.7 C) (03/30 0757) Pulse Rate:  [100-103] 100 (03/30 1300) Resp:  [11-21] 21 (03/30 1300) BP: (130-139)/(68-80) 139/68 (03/30 1300) SpO2:  [100 %] 100 % (03/30 1300)    Weight change: There were no vitals filed for this visit.  Intake/Output:  No intake or output data in the 24 hours ending 06/22/19 1329    Physical Exam    General:  Appears chronically ill.  No resp difficulty HEENT: normal Neck: supple. JVP to jaw. Carotids 2+ bilat; no bruits. No lymphadenopathy or thyromegaly appreciated. Cor: PMI nondisplaced. Regular rate & rhythm. No rubs, +  S3. Left upper chest tunneled PICC.  Lungs: clear Abdomen: soft, nontender, nondistended. No hepatosplenomegaly. No bruits or masses. Good bowel sounds. Extremities: no cyanosis, clubbing, rash, Extremities cool. R and LLE 2+ edema Neuro: alert & orientedx3, cranial nerves grossly intact. moves all  4 extremities w/o difficulty. Affect flat    Telemetry   SR/ST 90-100s with PVCs   EKG    Sinus Tach 104 bpm   Labs   Basic Metabolic Panel: Recent Labs  Lab 06/22/19 0828  NA 138  K 4.0  CL 107  CO2 18*  GLUCOSE 96  BUN 34*  CREATININE 2.04*  CALCIUM 9.3    Liver Function Tests: Recent Labs  Lab 06/22/19 0828  AST 27  ALT 40  ALKPHOS 118  BILITOT 1.7*  PROT 5.9*  ALBUMIN 3.0*   Recent Labs  Lab 06/22/19 0828  LIPASE 15   No results for input(s):  AMMONIA in the last 168 hours.  CBC: Recent Labs  Lab 06/22/19 0828  WBC 11.7*  NEUTROABS 10.2*  HGB 10.4*  HCT 33.3*  MCV 83.9  PLT 135*    Cardiac Enzymes: No results for input(s): CKTOTAL, CKMB, CKMBINDEX, TROPONINI in the last 168 hours.  BNP: BNP (last 3 results) Recent Labs    05/18/19 1707 05/21/19 1206 06/22/19 0828  BNP >4,500.0* 1,490.6* >4,500.0*    ProBNP (last 3 results) No results for input(s): PROBNP in the last 8760 hours.   CBG: No results for input(s): GLUCAP in the last 168 hours.  Coagulation Studies: No results for input(s): LABPROT, INR in the last 72 hours.   Imaging   DG Chest Port 1 View  Result Date: 06/22/2019 CLINICAL DATA:  Shortness of breath. EXAM: PORTABLE CHEST 1 VIEW COMPARISON:  05/29/2019.  06/05/2017. FINDINGS: Central line noted with tip over SVC. Prior cardiac valve repair. Cardiomegaly. Postsurgical changes right lung. Chronic interstitial changes noted. No focal infiltrate. No pleural effusion or pneumothorax. IMPRESSION: 1.  Central line noted with tip over SVC. 2.  Prior cardiac valve repair.  Cardiomegaly. 3. Postsurgical changes right lung. Chronic interstitial changes. No acute infiltrate noted. Electronically Signed   By: Marcello Moores  Register   On: 06/22/2019 08:46      Medications:     Current Medications:   Infusions: . milrinone 0.25 mcg/kg/min (06/22/19 0850)      Assessment/Plan   1. End Stage A/C Biventricular HF  ECHO 04/2019 Biventricular HF EF 15%.  On home milrinone 0.25 mcg.  - Continue milrinone 0.25 mcg.  -Volume overloaded. Start 80 mg IV lasix twice a day.  - No bb with shock. -Watch renal function closely.  - Need Palliative Care for goals of care. - Followed by [facility] for milrinone  2. CAD -s/p CABG with LIMA to LAD and MVR (bioprosthetic -No chest pain  -HS Trop 132>140   3. Moderate to Severe AI   4. Lung Cancer S/P Resection  5. PAD, severe s/p left fem-pop and L CEA - bilateral  RAS s/p stenting  6.  CKD Stage IIIb  -Creatinine 2.04.  -Follow renal function.  7. PVCs   Needs Palliative Care goals of care.   Length of Stay: 0  Darrick Grinder, NP  06/22/2019, 1:29 PM  Advanced Heart Failure Team Pager [phone] (M-F; 7a - 4p)  Please contact [facility] for night-coverage after hours (4p -7a ) and weekends on [email or web address]  Patient seen and examined with the above-signed Advanced Practice Provider and/or Housestaff. I personally reviewed laboratory data, imaging studies and relevant notes. I independently examined the patient and formulated the important aspects of the plan. I have edited the note to reflect any of my changes or salient points. I have personally discussed the plan with the patient and/or family.  Patient well known to  HF team with recent admission for cardiogenic shock likely due to viral myocarditis.   During last admit was very debilitated and weak. EF 15%. Unable to wean milrinone. I was hopeful that EF might improve some over time with improvement of viral CM.   Was discharged to [facility]. Presents back to day with worsening HF symptoms after milrinone pump disconnected for several hours.   On exam she is frail and weak CVP to ear Cor tachy regular + s3 + AI Ab soft NT Ext 2+ edema cool   She is truly end-stage. She wants to go home. Would bring her in for diuresis and discussion regarding transition to [place] vs residential Hospice.   Glori Bickers, MD  4:09 PM

## 2019-06-22 NOTE — ED Provider Notes (Signed)
[facility] EMERGENCY DEPARTMENT Provider Note   CSN: [id number] Arrival date & time: 06/22/19  0751     History Chief Complaint  Patient presents with  . Weakness  . Leg Swelling    Leah [name] is a 69 y.o. female.  She is coming by EMS from [facility] home with complaint of disruption in her milrinone infusion.  The tubing broke around 4 AM and she has not had any infusions since then.  Complaining of increased shortness of breath.  Also has peripheral edema swelling in her feet that cause her pain.  Complaining of some mild substernal chest pain unclear when that started.  Also states she has had about 20 minutes of abdominal pain. Recent admission for CHF and found to have biventircular failure with poor EF.   The history is provided by the patient.  Shortness of Breath Severity:  Moderate Onset quality:  Gradual Timing:  Constant Progression:  Unchanged Chronicity:  Recurrent Context comment:  Milrinone infusion stopped Relieved by:  None tried Worsened by:  Nothing Ineffective treatments:  None tried Associated symptoms: abdominal pain and chest pain   Associated symptoms: no cough, no diaphoresis, no fever, no headaches, no hemoptysis, no neck pain, no rash, no sore throat, no sputum production, no syncope, no vomiting and no wheezing        Past Medical History:  Diagnosis Date  . Anemia   . Bronchogenic lung cancer, right ([place]) 11/29/2016  . CAD (coronary artery disease)    a. s/p LIMA-LAD in 11/2014  . Carotid artery occlusion   . CHF (congestive heart failure) ([place])   . Complication of anesthesia    slow to awaken x 1 maybe 2001  . COPD (chronic obstructive pulmonary disease) ([place])   . GERD (gastroesophageal reflux disease)    "sometimes" takes Copywriter, advertising or drinks gingerale   . Headache(784.0)   . History of kidney stones   . Hyperlipidemia   . Hypertension   . Leg pain   . Peripheral vascular disease ([place])   . Renal  vascular disease 10/26/2014   bilateral stents placed   . Severe mitral regurgitation    a. s/p MVR in 11/2014 with a pericardial tissue valve  . Shortness of breath dyspnea   . Tobacco abuse     Patient Active Problem List   Diagnosis Date Noted  . Pressure injury of skin 05/31/2019  . Acute renal failure with acute tubular necrosis superimposed on stage 3a chronic kidney disease ([place])   . Acute on chronic combined systolic and diastolic heart failure ([place])   . Palliative care encounter   . NSTEMI (non-ST elevated myocardial infarction) ([place])   . Palliative care by specialist   . Protein-calorie malnutrition, severe 05/19/2019  . AMS (altered mental status) 05/18/2019  . Cardiogenic shock ([place]) 05/18/2019  . AKI (acute kidney injury) ([place])   . Shortness of breath 04/15/2019  . Thoracic aortic aneurysm without rupture ([place]) 09/03/2018  . Viral upper respiratory illness 03/16/2018  . Hypokalemia 02/12/2018  . Claudication ([place]) 11/21/2017  . Seasonal allergies 07/28/2017  . Bilateral sciatica 07/02/2017  . Bilateral hearing loss due to cerumen impaction 07/02/2017  . CHF (congestive heart failure) ([place]) 06/05/2017  . Bronchogenic lung cancer, right ([place]) 11/29/2016  . Dysphagia 08/26/2016  . Health care maintenance 04/17/2016  . Dizziness 04/17/2016  . Decreased visual acuity 01/17/2016  . Depressed mood 07/27/2015  . S/P MVR (mitral valve replacement) 12/05/2014  . Moderate aortic regurgitation 10/26/2014  .  Renal vascular disease 10/26/2014  . Acute on chronic diastolic congestive heart failure ([place]) 10/21/2014  . Tobacco use 10/21/2014  . Diastolic dysfunction, grade 2 by echo June 2016 10/21/2014  . Carpal tunnel syndrome 10/17/2014  . Severe mitral regurgitation 08/25/2014  . Unstable angina ([place]) 08/22/2014  . Low back pain 07/05/2012  . PVC (premature ventricular contraction) 10/02/2011  . History of angioedema with ACE 2013 06/14/2011  . PVD- s/p multiple  proceedures 06/04/2011  . Hyperlipidemia 09/28/2008  . Essential hypertension, benign 09/28/2008  . CAD S/P LAD DES 2009 with 70% ISR 08/24/14 09/28/2008    Past Surgical History:  Procedure Laterality Date  . ABDOMINAL AORTOGRAM W/LOWER EXTREMITY Left 09/16/2018   Procedure: ABDOMINAL AORTOGRAM W/LOWER EXTREMITY;  Surgeon: Marty Heck, MD;  Location: [facility];  Service: Cardiovascular;  Laterality: Left;  . ABDOMINAL HYSTERECTOMY    . ANGIOPLASTY / STENTING ILIAC  2010   right external iliac by Dr. Irish Lack  . CARDIAC CATHETERIZATION  11/13/11   Left Heart Cath. with Coronary Angiogram  . CARDIAC CATHETERIZATION N/A 08/24/2014   Procedure: Left Heart Cath and Coronary Angiography;  Surgeon: Wellington Hampshire, MD;  Location: [facility];  Service: Cardiovascular;  Laterality: N/A;  . CARDIAC CATHETERIZATION N/A 10/26/2014   Procedure: Right/Left Heart Cath and Coronary Angiography;  Surgeon: Peter M Martinique, MD; oLAD 70%, mLAD 70% ISR, D2 30%, OFC 30%, RCA 20%, EF nl, low R heart pressures after diuresis, severe MR  . CAROTID ENDARTERECTOMY  11/21/2007   left  . COLONOSCOPY W/ POLYPECTOMY    . CORONARY ANGIOPLASTY WITH STENT PLACEMENT  6/09   LAD 2.5x12 Promus  . CORONARY ARTERY BYPASS GRAFT N/A 12/05/2014   Procedure: CORONARY ARTERY BYPASS GRAFTING (CABG);  Surgeon: [name], MD;  Location: [facility];  Service: Open Heart Surgery;  Laterality: N/A;  Times 1 using left internal mammary artery to LAD  . FEMORAL-POPLITEAL BYPASS GRAFT  10/12   left Dr. Kellie Simmering  . IR FLUORO GUIDE CV LINE LEFT  06/02/2019  . IR US GUIDE VASC ACCESS LEFT  06/02/2019  . LOWER EXTREMITY ANGIOGRAPHY N/A 02/04/2018   Procedure: LOWER EXTREMITY ANGIOGRAPHY;  Surgeon: Marty Heck, MD;  Location: [facility];  Service: Cardiovascular;  Laterality: N/A;  . MITRAL VALVE REPAIR  2016  . MITRAL VALVE REPLACEMENT N/A 12/05/2014   Procedure: MITRAL VALVE (MV) REPLACEMENT;  Surgeon:  [name], MD;  Location: [facility];  Service: Open Heart Surgery;  Laterality: N/A;  Closure left atrial appendage  . PERIPHERAL VASCULAR BALLOON ANGIOPLASTY Bilateral 09/16/2018   Procedure: PERIPHERAL VASCULAR BALLOON ANGIOPLASTY;  Surgeon: Marty Heck, MD;  Location: [facility];  Service: Cardiovascular;  Laterality: Bilateral;  bilateral renal arteries, Left external iliac  . PERIPHERAL VASCULAR INTERVENTION Left 02/04/2018   Procedure: PERIPHERAL VASCULAR INTERVENTION;  Surgeon: Marty Heck, MD;  Location: [facility];  Service: Cardiovascular;  Laterality: Left;  external iliac  . RENAL ANGIOGRAPHY Bilateral 09/16/2018   Procedure: RENAL ANGIOGRAPHY;  Surgeon: Marty Heck, MD;  Location: [facility];  Service: Cardiovascular;  Laterality: Bilateral;  . RENAL ARTERY STENT Bilateral   . TEE WITHOUT CARDIOVERSION N/A 10/24/2014   Procedure: TRANSESOPHAGEAL ECHOCARDIOGRAM (TEE);  Surgeon: Thayer Headings, MD;  Location: [facility];  Service: Cardiovascular;  Laterality: N/A;  . TEE WITHOUT CARDIOVERSION N/A 12/05/2014   Procedure: TRANSESOPHAGEAL ECHOCARDIOGRAM (TEE);  Surgeon: [name], MD;  Location: [facility];  Service: Open Heart Surgery;  Laterality: N/A;  . VIDEO ASSISTED THORACOSCOPY (VATS)/WEDGE RESECTION Right 11/29/2016   Procedure: VIDEO ASSISTED THORACOSCOPY (VATS)/ RUL WEDGE RESECTION OF LESION/ NODE SAMPLING;  Surgeon: [name], MD;  Location: [facility];  Service: Thoracic;  Laterality: Right;  Marland Kitchen VIDEO BRONCHOSCOPY N/A 11/29/2016   Procedure: VIDEO BRONCHOSCOPY;  Surgeon: [name], MD;  Location: [facility];  Service: Thoracic;  Laterality: N/A;     OB History   No obstetric history on file.     Family History  Problem Relation Age of Onset  . Cancer Mother        BRAIN AND LUNG  . Hypertension Mother   . Hypertension Father   . Heart disease Father   . Prostate cancer Father   . Kidney disease Father   .  Hypertension Brother   . ALS Brother   . Stroke Paternal Aunt        great aunt  . Heart attack Neg Hx   . Colon cancer Neg Hx   . Stomach cancer Neg Hx   . Rectal cancer Neg Hx   . Esophageal cancer Neg Hx   . Liver cancer Neg Hx     Social History   Tobacco Use  . Smoking status: Current Every Day Smoker    Packs/day: 0.50    Years: 35.00    Pack years: 17.50    Types: Cigarettes  . Smokeless tobacco: Never Used  Substance Use Topics  . Alcohol use: Yes    Alcohol/week: 0.0 standard drinks    Comment: occasion  . Drug use: No    Home Medications Prior to Admission medications   Medication Sig Start Date End Date Taking? Authorizing Provider  albuterol (PROVENTIL HFA;VENTOLIN HFA) 108 (90 Base) MCG/ACT inhaler Inhale 2 puffs into the lungs every 6 (six) hours as needed for wheezing or shortness of breath. 02/20/16   Archie Patten, MD  amiodarone (PACERONE) 200 MG tablet Take 1 tablet (200 mg total) by mouth 2 (two) times daily. 06/03/19   [name], MD  aspirin EC 81 MG tablet Take 1 tablet (81 mg total) by mouth daily. 06/03/19   [name], MD  clopidogrel (PLAVIX) 75 MG tablet Take 1 tablet (75 mg total) by mouth daily. 06/03/19   [name], MD  cyclobenzaprine (FLEXERIL) 10 MG tablet Take 1 tablet (10 mg total) by mouth 3 (three) times daily as needed for muscle spasms. 06/03/19   [name], MD  feeding supplement, ENSURE ENLIVE, (ENSURE ENLIVE) LIQD Take 237 mLs by mouth 3 (three) times daily between meals. 06/03/19   [name], MD  fluticasone (FLOVENT HFA) 44 MCG/ACT inhaler Inhale 1 puff into the lungs daily. 04/12/19   Guadalupe Dawn, MD  furosemide (LASIX) 40 MG tablet Take 1 tablet (40 mg total) by mouth daily. 06/03/19   [name], MD  gabapentin (NEURONTIN) 100 MG capsule Take 2 capsules (200 mg total) by mouth at bedtime. 06/03/19   [name], MD  Hydrocortisone (GERHARDT'S BUTT CREAM) CREA Apply 1 application topically daily. Apply to  affected areas once daly 06/03/19   [name], MD  isosorbide-hydrALAZINE (BIDIL) 20-37.5 MG tablet Take 1 tablet by mouth 3 (three) times daily. 06/03/19   [name], MD  milrinone (PRIMACOR) 20 MG/100 ML SOLN infusion Inject 0.0159 mg/min into the vein continuous. 06/03/19   [name], MD  Multiple Vitamin (MULTIVITAMIN WITH MINERALS) TABS tablet Take 1 tablet by mouth daily. 06/03/19   [name],  MD  polyethylene glycol (MIRALAX / GLYCOLAX) 17 g packet Take 17 g by mouth daily as needed for mild constipation. 06/03/19   [name], MD  senna (SENOKOT) 8.6 MG TABS tablet Take 1 tablet (8.6 mg total) by mouth daily. 06/03/19   [name], MD    Allergies    Lisinopril, Chantix [varenicline], and Penicillins  Review of Systems   Review of Systems  Constitutional: Negative for diaphoresis and fever.  HENT: Negative for sore throat.   Eyes: Negative for visual disturbance.  Respiratory: Positive for shortness of breath. Negative for cough, hemoptysis, sputum production and wheezing.   Cardiovascular: Positive for chest pain and leg swelling. Negative for syncope.  Gastrointestinal: Positive for abdominal pain. Negative for nausea and vomiting.  Genitourinary: Negative for dysuria.  Musculoskeletal: Negative for neck pain.  Skin: Negative for rash.  Neurological: Negative for headaches.    Physical Exam Updated Vital Signs BP 130/76 (BP Location: Right Arm)   Pulse (!) 103   Temp 98.1 F (36.7 C) (Oral)   Resp 20   SpO2 100%   Physical Exam Vitals and nursing note reviewed.  Constitutional:      General: She is not in acute distress.    Appearance: She is well-developed.  HENT:     Head: Normocephalic and atraumatic.  Eyes:     Conjunctiva/sclera: Conjunctivae normal.  Cardiovascular:     Rate and Rhythm: Regular rhythm. Tachycardia present.     Heart sounds: No murmur.  Pulmonary:     Effort: Pulmonary effort is normal. No respiratory distress.     Breath  sounds: Normal breath sounds.  Abdominal:     Palpations: Abdomen is soft.     Tenderness: There is no abdominal tenderness. There is no guarding or rebound.  Musculoskeletal:     Cervical back: Neck supple.     Right lower leg: Edema present.     Left lower leg: Edema present.  Skin:    General: Skin is warm and dry.     Capillary Refill: Capillary refill takes less than 2 seconds.  Neurological:     General: No focal deficit present.     Mental Status: She is alert.     ED Results / Procedures / Treatments   Labs (all labs ordered are listed, but only abnormal results are displayed) Labs Reviewed  COMPREHENSIVE METABOLIC PANEL - Abnormal; Notable for the following components:      Result Value   CO2 18 (*)    BUN 34 (*)    Creatinine, Ser 2.04 (*)    Total Protein 5.9 (*)    Albumin 3.0 (*)    Total Bilirubin 1.7 (*)    GFR calc non Af Amer 25 (*)    GFR calc Af Amer 29 (*)    All other components within normal limits  BRAIN NATRIURETIC PEPTIDE - Abnormal; Notable for the following components:   B Natriuretic Peptide >4,500.0 (*)    All other components within normal limits  CBC WITH DIFFERENTIAL/PLATELET - Abnormal; Notable for the following components:   WBC 11.7 (*)    Hemoglobin 10.4 (*)    HCT 33.3 (*)    RDW 26.5 (*)    Platelets 135 (*)    nRBC 0.9 (*)    Neutro Abs 10.2 (*)    Abs Immature Granulocytes 0.10 (*)    All other components within normal limits  TROPONIN I (HIGH SENSITIVITY) - Abnormal; Notable for the following components:   Troponin I (  High Sensitivity) 132 (*)    All other components within normal limits  TROPONIN I (HIGH SENSITIVITY) - Abnormal; Notable for the following components:   Troponin I (High Sensitivity) 140 (*)    All other components within normal limits  SARS CORONAVIRUS 2 (TAT 6-24 HRS)  LIPASE, BLOOD  CBC  CREATININE, SERUM    EKG EKG Interpretation  Date/Time:  Tuesday June 22 2019 07:52:21 EDT Ventricular Rate:    104 PR Interval:    QRS Duration: 120 QT Interval:  355 QTC Calculation: 467 R Axis:   1 Text Interpretation: Sinus tachycardia Prominent P waves, nondiagnostic Nonspecific intraventricular conduction delay Probable anteroseptal infarct, recent ST elevation, consider inferior injury No significant change since prior 3/21 Confirmed by Aletta Edouard [phone]) on 06/22/2019 7:57:02 AM   Radiology DG Chest Port 1 View  Result Date: 06/22/2019 CLINICAL DATA:  Shortness of breath. EXAM: PORTABLE CHEST 1 VIEW COMPARISON:  05/29/2019.  06/05/2017. FINDINGS: Central line noted with tip over SVC. Prior cardiac valve repair. Cardiomegaly. Postsurgical changes right lung. Chronic interstitial changes noted. No focal infiltrate. No pleural effusion or pneumothorax. IMPRESSION: 1.  Central line noted with tip over SVC. 2.  Prior cardiac valve repair.  Cardiomegaly. 3. Postsurgical changes right lung. Chronic interstitial changes. No acute infiltrate noted. Electronically Signed   By: Marcello Moores  Register   On: 06/22/2019 08:46    Procedures Procedures (including critical care time)  Medications Ordered in ED Medications  milrinone (PRIMACOR) 20 MG/100 ML (0.2 mg/mL) infusion (0.25 mcg/kg/min  63.5 kg (Order-Specific) Intravenous New Bag/Given 06/22/19 0850)  furosemide (LASIX) injection 80 mg (80 mg Intravenous Given 06/22/19 1514)  albuterol (VENTOLIN HFA) 108 (90 Base) MCG/ACT inhaler 2 puff (has no administration in time range)  fluticasone (FLOVENT HFA) 44 MCG/ACT inhaler 1 puff (1 puff Inhalation Not Given 06/22/19 1626)  aspirin EC tablet 81 mg (has no administration in time range)  amiodarone (PACERONE) tablet 200 mg (has no administration in time range)  polyethylene glycol (MIRALAX / GLYCOLAX) packet 17 g (has no administration in time range)  senna (SENOKOT) tablet 8.6 mg (has no administration in time range)  clopidogrel (PLAVIX) tablet 75 mg (has no administration in time range)  gabapentin  (NEURONTIN) capsule 200 mg (has no administration in time range)  feeding supplement (ENSURE ENLIVE) (ENSURE ENLIVE) liquid 237 mL (has no administration in time range)  Gerhardt's butt cream 1 application (has no administration in time range)  isosorbide-hydrALAZINE (BIDIL) 20-37.5 MG per tablet 1 tablet (has no administration in time range)  potassium chloride SA (KLOR-CON) CR tablet 20 mEq (has no administration in time range)  heparin injection 5,000 Units (has no administration in time range)    ED Course  I have reviewed the triage vital signs and the nursing notes.  Pertinent labs & imaging results that were available during my care of the patient were reviewed by me and considered in my medical decision making (see chart for details).  Clinical Course as of Jun 21 1701  Tue Jun 22, 7670  7723 69 year old female with recent admission for cardiomyopathy and cardiogenic shock here now with increased shortness of breath in the setting of milrinone pump infusion problem.. Differential includes CHF, ACS, pneumonia, metabolic derangement, anemia   [MB]  0908 Reevaluation-patient resting comfortably.  She is on her chronic 3 L nasal cannula.  Sats good at 100%.  Milrinone infusing.   [MB]  [id number] Lab work showing a low but stable hemoglobin at 10.4.  Platelets are newly low  at 135   [MB]  0949 Creatinine slightly worse than discharge.  Bicarb slightly lower.  Troponin elevated at 132 down from prior admission   [MB]  1017 Patient's BNP markedly elevated at greater than 4500 which is increased from her prior discharge BNP.  Consulted cardiology for evaluation of patient and recommendations.   [MB]  3154 Patient was evaluated by heart failure team.  They are ordering some IV Lasix and recommend admission back to the hospital.  They are going to need to talk to the family about hospice as she is end-stage biventricular failure.   [MB]    Clinical Course User Index [MB] Hayden Rasmussen, MD     MDM Rules/Calculators/A&P                       Final Clinical Impression(s) / ED Diagnoses Final diagnoses:  Biventricular heart failure [facility])    Rx / DC Orders ED Discharge Orders    None       Hayden Rasmussen, MD 06/22/19 1704

## 2019-06-22 NOTE — ED Triage Notes (Signed)
Pt arrives to ED from [facility] with complaints of weakness, leg swelling, and shortness of breath. Patient states this has been going on for awhile but worsened early this morning when the tubing for her milrinone drip broke and she has not had it running since 4am.

## 2019-06-22 NOTE — ED Notes (Signed)
Dinner Tray Ordered @ [phone].

## 2019-06-22 NOTE — Plan of Care (Signed)
  Problem: Elimination: Goal: Will not experience complications related to urinary retention Outcome: Progressing   Problem: Pain Managment: Goal: General experience of comfort will improve Outcome: Progressing   Problem: Safety: Goal: Ability to remain free from injury will improve Outcome: Progressing   

## 2019-06-22 NOTE — H&P (Addendum)
[facility] Admission History and Physical Service Pager: [phone]  Patient name: Dovey Fatzinger [facility] Medical record number: [id number] Date of birth: 09/25/1950 Age: 69 y.o. Gender: female  Primary Care Provider: Guadalupe Dawn, MD Consultants: HF Code Status: DNR Preferred Emergency Contact: son Romelia Bromell [phone]  Chief Complaint: PICC torn  Assessment and Plan: KIRA HARTL is a 69 y.o. female presenting with SOB, concern milrinone PICC torn. PMH is significant for viral cardiomyopathy, CAD, HTN, HLD, PVD, HFrEF (15%), CKD, MV replacement, s/p resection for bronchogenic lung cancer, tobacco use  Severe Biventricular Heart failure  CAD Patient presents today with worsening HF symptoms after concern for PICC line for milrinone torn, milrinone off for ~4 hours. Patient with recent admission starting in February for cardiogenic shock, most likely due to viral cardiomyopathy. She is also s/p CABG w/ LIMA to LAD and MVR (bioprosthestic). By time of most recent discharge, patient's EF reached 15%, she became DNR status at that time and consulted with palliative medicine. The hope per HF team was that with several weeks of treatment with milrinone that she may recover some EF. However, patient is having worsening symptoms despite treatment. On exam, patient has JVP to jaw line, +2 LE edema, SOB, but is resting comfortably, no chest pain. She reports that her LE edema has actually improved since discharge. High-sensitivity troponins 132 on admission which is improved from last admission in the 400s. BNP >4500. WBCs mildly elevated to 11.7, but no evidence of infection otherwise, afebrile, CXR without infiltrate. Confirmed PICC placement with xray, intact, now infusing milrinone again per HF. Home medications include BiDil and amiodarone. HF team has seen patient and recommends admission for diuresis as well as clarification of GOC and consideration of  hospice. Will consult palliative team which patient is open to.  -Admit to cardiology unit obs, attending Dr. Owens Shark -Continuous cardiac monitoring -Follow-up cardiology recommendations  - Milrinone per HF  - IV lasix 80mg  BID  -No BB due to cardiogenic shock history -Consult palliative medicine, appreciate recommendations -Strict I/Os -Daily weights -Daily CBC, BMP -Up with assistance -PT/OT -Heart healthy diet with fluid restriction -Heparin for DVT ppx  -Continue BiDil, amiodarone  AKI on CKDStage IIIB Creatinine 2.04 on admission, on most recent discharge was 1.71. Repeat cr this afternoon improved to 1.88. Patient with end stage heart failure symptoms. Cardiology recommends 80mg  IV lasix BID. Will watch creatinine. -Reassess for lasix dosing daily -Daily BMP -Avoid other nephrotoxic medications  PAD, severe Patient s/p left fem-pop and L CEA, bilateral RAS s/p stenting. Home medications are clopidogrel, asa. -continue home medications  Electrolyte imbalances Patient has history of hyponatremia, hypokalemia.  Sodium and potassium within normal limits on admission. Home medication includes klor con 4mEq daily. - daily BMP - will replete as needed  FEN/GI: fluid restriction, heart healthy Prophylaxis: heparin  Disposition: admit for observation, consult palliative  History of Present Illness:  JENNA ROUTZAHN is a 69 y.o. female recently discharged from service with cardiogenic shock most likely due to viral myocarditis, presenting with worsening SOB due to concern for displacement of PICC line and disconnection of milrinone for several hours. Patient was cared for by HF team during last admission and made DNR and had palliative outpatient follow up. Her EF at last admission was 15%, and since shock was thought to be due to viral myocarditis, the hope was that with milrinone treatment patient may recover some EF. PICC line was confirmed to be in place and milrinone  restarted. However, per HF team and Dr. Haroldine Laws, patient is end stage. Plan to consult palliative medicine and discuss GOC and possible transition to home hospice vs residential hospice.    Review Of Systems: Per HPI with the following additions:   Review of Systems  Constitutional: Negative for chills, fever and weight loss.  HENT: Negative for congestion, sinus pain and sore throat.   Eyes: Negative for blurred vision and double vision.  Respiratory: Positive for shortness of breath. Negative for cough, sputum production and wheezing.   Cardiovascular: Positive for leg swelling. Negative for chest pain, palpitations and orthopnea.  Gastrointestinal: Positive for abdominal pain (epigastric). Negative for constipation, diarrhea, nausea and vomiting.  Genitourinary: Negative for dysuria.  Neurological: Negative for dizziness and headaches.    Patient Active Problem List   Diagnosis Date Noted  . Pressure injury of skin 05/31/2019  . Acute renal failure with acute tubular necrosis superimposed on stage 3a chronic kidney disease ([place])   . Acute on chronic combined systolic and diastolic heart failure ([place])   . Palliative care encounter   . NSTEMI (non-ST elevated myocardial infarction) ([place])   . Palliative care by specialist   . Protein-calorie malnutrition, severe 05/19/2019  . AMS (altered mental status) 05/18/2019  . Cardiogenic shock ([place]) 05/18/2019  . AKI (acute kidney injury) ([place])   . Shortness of breath 04/15/2019  . Thoracic aortic aneurysm without rupture ([place]) 09/03/2018  . Viral upper respiratory illness 03/16/2018  . Hypokalemia 02/12/2018  . Claudication ([place]) 11/21/2017  . Seasonal allergies 07/28/2017  . Bilateral sciatica 07/02/2017  . Bilateral hearing loss due to cerumen impaction 07/02/2017  . CHF (congestive heart failure) ([place]) 06/05/2017  . Bronchogenic lung cancer, right ([place]) 11/29/2016  . Dysphagia 08/26/2016  . Health care maintenance 04/17/2016  .  Dizziness 04/17/2016  . Decreased visual acuity 01/17/2016  . Depressed mood 07/27/2015  . S/P MVR (mitral valve replacement) 12/05/2014  . Moderate aortic regurgitation 10/26/2014  . Renal vascular disease 10/26/2014  . Acute on chronic diastolic congestive heart failure ([place]) 10/21/2014  . Tobacco use 10/21/2014  . Diastolic dysfunction, grade 2 by echo June 2016 10/21/2014  . Carpal tunnel syndrome 10/17/2014  . Severe mitral regurgitation 08/25/2014  . Unstable angina ([place]) 08/22/2014  . Low back pain 07/05/2012  . PVC (premature ventricular contraction) 10/02/2011  . History of angioedema with ACE 2013 06/14/2011  . PVD- s/p multiple proceedures 06/04/2011  . Hyperlipidemia 09/28/2008  . Essential hypertension, benign 09/28/2008  . CAD S/P LAD DES 2009 with 70% ISR 08/24/14 09/28/2008    Past Medical History: Past Medical History:  Diagnosis Date  . Anemia   . Bronchogenic lung cancer, right ([place]) 11/29/2016  . CAD (coronary artery disease)    a. s/p LIMA-LAD in 11/2014  . Carotid artery occlusion   . CHF (congestive heart failure) ([place])   . Complication of anesthesia    slow to awaken x 1 maybe 2001  . COPD (chronic obstructive pulmonary disease) ([place])   . GERD (gastroesophageal reflux disease)    "sometimes" takes Copywriter, advertising or drinks gingerale   . Headache(784.0)   . History of kidney stones   . Hyperlipidemia   . Hypertension   . Leg pain   . Peripheral vascular disease ([place])   . Renal vascular disease 10/26/2014   bilateral stents placed   . Severe mitral regurgitation    a. s/p MVR in 11/2014 with a pericardial tissue valve  . Shortness of breath dyspnea   .  Tobacco abuse     Past Surgical History: Past Surgical History:  Procedure Laterality Date  . ABDOMINAL AORTOGRAM W/LOWER EXTREMITY Left 09/16/2018   Procedure: ABDOMINAL AORTOGRAM W/LOWER EXTREMITY;  Surgeon: Marty Heck, MD;  Location: [place] [facility];  Service: Cardiovascular;  Laterality:  Left;  . ABDOMINAL HYSTERECTOMY    . ANGIOPLASTY / STENTING ILIAC  2010   right external iliac by Dr. Irish Lack  . CARDIAC CATHETERIZATION  11/13/11   Left Heart Cath. with Coronary Angiogram  . CARDIAC CATHETERIZATION N/A 08/24/2014   Procedure: Left Heart Cath and Coronary Angiography;  Surgeon: Wellington Hampshire, MD;  Location: [place] [facility];  Service: Cardiovascular;  Laterality: N/A;  . CARDIAC CATHETERIZATION N/A 10/26/2014   Procedure: Right/Left Heart Cath and Coronary Angiography;  Surgeon: Peter M Martinique, MD; oLAD 70%, mLAD 70% ISR, D2 30%, OFC 30%, RCA 20%, EF nl, low R heart pressures after diuresis, severe MR  . CAROTID ENDARTERECTOMY  11/21/2007   left  . COLONOSCOPY W/ POLYPECTOMY    . CORONARY ANGIOPLASTY WITH STENT PLACEMENT  6/09   LAD 2.5x12 Promus  . CORONARY ARTERY BYPASS GRAFT N/A 12/05/2014   Procedure: CORONARY ARTERY BYPASS GRAFTING (CABG);  Surgeon: [name], MD;  Location: [place];  Service: Open Heart Surgery;  Laterality: N/A;  Times 1 using left internal mammary artery to LAD  . FEMORAL-POPLITEAL BYPASS GRAFT  10/12   left Dr. Kellie Simmering  . IR FLUORO GUIDE CV LINE LEFT  06/02/2019  . IR US GUIDE VASC ACCESS LEFT  06/02/2019  . LOWER EXTREMITY ANGIOGRAPHY N/A 02/04/2018   Procedure: LOWER EXTREMITY ANGIOGRAPHY;  Surgeon: Marty Heck, MD;  Location: [place] [facility];  Service: Cardiovascular;  Laterality: N/A;  . MITRAL VALVE REPAIR  2016  . MITRAL VALVE REPLACEMENT N/A 12/05/2014   Procedure: MITRAL VALVE (MV) REPLACEMENT;  Surgeon: [name], MD;  Location: [place];  Service: Open Heart Surgery;  Laterality: N/A;  Closure left atrial appendage  . PERIPHERAL VASCULAR BALLOON ANGIOPLASTY Bilateral 09/16/2018   Procedure: PERIPHERAL VASCULAR BALLOON ANGIOPLASTY;  Surgeon: Marty Heck, MD;  Location: [place] [facility];  Service: Cardiovascular;  Laterality: Bilateral;  bilateral renal arteries, Left external iliac  . PERIPHERAL VASCULAR  INTERVENTION Left 02/04/2018   Procedure: PERIPHERAL VASCULAR INTERVENTION;  Surgeon: Marty Heck, MD;  Location: [place] [facility];  Service: Cardiovascular;  Laterality: Left;  external iliac  . RENAL ANGIOGRAPHY Bilateral 09/16/2018   Procedure: RENAL ANGIOGRAPHY;  Surgeon: Marty Heck, MD;  Location: [place] [facility];  Service: Cardiovascular;  Laterality: Bilateral;  . RENAL ARTERY STENT Bilateral   . TEE WITHOUT CARDIOVERSION N/A 10/24/2014   Procedure: TRANSESOPHAGEAL ECHOCARDIOGRAM (TEE);  Surgeon: Thayer Headings, MD;  Location: [facility];  Service: Cardiovascular;  Laterality: N/A;  . TEE WITHOUT CARDIOVERSION N/A 12/05/2014   Procedure: TRANSESOPHAGEAL ECHOCARDIOGRAM (TEE);  Surgeon: [name], MD;  Location: [place];  Service: Open Heart Surgery;  Laterality: N/A;  . VIDEO ASSISTED THORACOSCOPY (VATS)/WEDGE RESECTION Right 11/29/2016   Procedure: VIDEO ASSISTED THORACOSCOPY (VATS)/ RUL WEDGE RESECTION OF LESION/ NODE SAMPLING;  Surgeon: [name], MD;  Location: [place];  Service: Thoracic;  Laterality: Right;  Marland Kitchen VIDEO BRONCHOSCOPY N/A 11/29/2016   Procedure: VIDEO BRONCHOSCOPY;  Surgeon: [name], MD;  Location: [facility];  Service: Thoracic;  Laterality: N/A;    Social History: Social History   Tobacco Use  . Smoking status: Current Every Day Smoker  Packs/day: 0.50    Years: 35.00    Pack years: 17.50    Types: Cigarettes  . Smokeless tobacco: Never Used  Substance Use Topics  . Alcohol use: Yes    Alcohol/week: 0.0 standard drinks    Comment: occasion  . Drug use: No   Additional social history: patient DNR at last admission, to [facility] with milrinone drip Please also refer to relevant sections of EMR.  Family History: Family History  Problem Relation Age of Onset  . Cancer Mother        BRAIN AND LUNG  . Hypertension Mother   . Hypertension Father   . Heart disease Father   . Prostate cancer Father   . Kidney  disease Father   . Hypertension Brother   . ALS Brother   . Stroke Paternal Aunt        great aunt  . Heart attack Neg Hx   . Colon cancer Neg Hx   . Stomach cancer Neg Hx   . Rectal cancer Neg Hx   . Esophageal cancer Neg Hx   . Liver cancer Neg Hx    Allergies and Medications: Allergies  Allergen Reactions  . Lisinopril Swelling    Angioedema 06/10/11  . Chantix [Varenicline] Other (See Comments)    Caused insomnia  . Penicillins Hives    Did it involve swelling of the face/tongue/throat, SOB, or low BP? No Did it involve sudden or severe rash/hives, skin peeling, or any reaction on the inside of your mouth or nose? No Did you need to seek medical attention at a hospital or doctor's office? No When did it last happen?50 Years If all above answers are "NO", may proceed with cephalosporin use.     No current facility-administered medications on file prior to encounter.   Current Outpatient Medications on File Prior to Encounter  Medication Sig Dispense Refill  . albuterol (PROVENTIL HFA;VENTOLIN HFA) 108 (90 Base) MCG/ACT inhaler Inhale 2 puffs into the lungs every 6 (six) hours as needed for wheezing or shortness of breath. 1 Inhaler 0  . amiodarone (PACERONE) 200 MG tablet Take 1 tablet (200 mg total) by mouth 2 (two) times daily.    Marland Kitchen aspirin EC 81 MG tablet Take 1 tablet (81 mg total) by mouth daily. 90 tablet 1  . clopidogrel (PLAVIX) 75 MG tablet Take 1 tablet (75 mg total) by mouth daily. 30 tablet 6  . cyclobenzaprine (FLEXERIL) 10 MG tablet Take 1 tablet (10 mg total) by mouth 3 (three) times daily as needed for muscle spasms. 30 tablet 0  . docusate sodium (COLACE) 100 MG capsule Take 100 mg by mouth daily.    . fluticasone (FLOVENT HFA) 44 MCG/ACT inhaler Inhale 1 puff into the lungs daily. 2 Inhaler 0  . furosemide (LASIX) 40 MG tablet Take 1 tablet (40 mg total) by mouth daily. 30 tablet   . gabapentin (NEURONTIN) 100 MG capsule Take 2 capsules (200 mg total)  by mouth at bedtime.    Marland Kitchen Hydrocortisone (GERHARDT'S BUTT CREAM) CREA Apply 1 application topically daily. Apply to affected areas once daly    . isosorbide-hydrALAZINE (BIDIL) 20-37.5 MG tablet Take 1 tablet by mouth 3 (three) times daily. 90 tablet 11  . milrinone (PRIMACOR) 20 MG/100 ML SOLN infusion Inject 0.0159 mg/min into the vein continuous.    . Multiple Vitamins-Minerals (CEROVITE SENIOR) TABS Take 1 tablet by mouth daily.    . Nutritional Supplement LIQD Take 120 mLs by mouth 2 (two) times daily. MEDPASS    .  OXYGEN Inhale 2 L into the lungs continuous.    . polyethylene glycol (MIRALAX / GLYCOLAX) 17 g packet Take 17 g by mouth daily as needed for mild constipation. 14 each 0  . potassium chloride SA (KLOR-CON) 20 MEQ tablet Take 20 mEq by mouth 2 (two) times daily.    Marland Kitchen senna (SENOKOT) 8.6 MG TABS tablet Take 1 tablet (8.6 mg total) by mouth daily. 120 tablet 0  . feeding supplement, ENSURE ENLIVE, (ENSURE ENLIVE) LIQD Take 237 mLs by mouth 3 (three) times daily between meals. 237 mL 12  . Multiple Vitamin (MULTIVITAMIN WITH MINERALS) TABS tablet Take 1 tablet by mouth daily. (Patient not taking: Reported on 06/22/2019)      Objective: BP (!) 144/81   Pulse (!) 104   Temp 98.1 F (36.7 C) (Oral)   Resp 14   SpO2 100%  Exam: General: Chronically ill 68yo AA woman, AOx4, resting comfortably in bed, NAD Eyes: PERRLA ENTM: clear oropharynx  Neck: supple, JVP elevated Cardiovascular:  Regular rhythm, increased rate, no murmur/rub, S3 present Respiratory: CTAB, no increased WOB Gastrointestinal: soft, ND, patient reports diffuse, mild tenderness, no bruits or masses, no HSM, normal bowel sounds present MSK: full ROM Derm: no rashes or lesions Neuro: AOx3, no focal deficits Psych: Normal mood, full affect  Labs and Imaging: CBC BMET  Recent Labs  Lab 06/22/19 0828  WBC 11.7*  HGB 10.4*  HCT 33.3*  PLT 135*   Recent Labs  Lab 06/22/19 0828  NA 138  K 4.0  CL 107   CO2 18*  BUN 34*  CREATININE 2.04*  GLUCOSE 96  CALCIUM 9.3     EKG: EKG Interpretation  Date/Time:  Tuesday June 22 2019 07:52:21 EDT Ventricular Rate:  104 PR Interval:    QRS Duration: 120 QT Interval:  355 QTC Calculation: 467 R Axis:   1 Text Interpretation: Sinus tachycardia Prominent P waves, nondiagnostic Nonspecific intraventricular conduction delay Probable anteroseptal infarct, recent ST elevation, consider inferior injury No significant change since prior 3/21 Confirmed by Aletta Edouard [phone]) on 06/22/2019 7:57:02 AM  Gladys Damme, MD 06/22/2019, 4:12 PM PGY-1, [place] Intern pager: [phone], text pages welcome  RESIDENT ATTESTATION    I have seen and examined this patient.     I have discussed the findings and exam with the intern and agree with the above note, which I have edited appropriately in [place]. I helped develop the management plan that is described in the resident's note, and I agree with the content.   Doristine Mango, DO PGY-2 Family Medicine Resident

## 2019-06-23 DIAGNOSIS — Z85118 Personal history of other malignant neoplasm of bronchus and lung: Secondary | ICD-10-CM | POA: Diagnosis not present

## 2019-06-23 DIAGNOSIS — Z515 Encounter for palliative care: Secondary | ICD-10-CM | POA: Diagnosis present

## 2019-06-23 DIAGNOSIS — Z7189 Other specified counseling: Secondary | ICD-10-CM | POA: Diagnosis not present

## 2019-06-23 DIAGNOSIS — Z66 Do not resuscitate: Secondary | ICD-10-CM | POA: Diagnosis present

## 2019-06-23 DIAGNOSIS — I5033 Acute on chronic diastolic (congestive) heart failure: Secondary | ICD-10-CM | POA: Diagnosis not present

## 2019-06-23 DIAGNOSIS — I080 Rheumatic disorders of both mitral and aortic valves: Secondary | ICD-10-CM | POA: Diagnosis present

## 2019-06-23 DIAGNOSIS — I5084 End stage heart failure: Secondary | ICD-10-CM | POA: Diagnosis present

## 2019-06-23 DIAGNOSIS — I351 Nonrheumatic aortic (valve) insufficiency: Secondary | ICD-10-CM | POA: Diagnosis not present

## 2019-06-23 DIAGNOSIS — I130 Hypertensive heart and chronic kidney disease with heart failure and stage 1 through stage 4 chronic kidney disease, or unspecified chronic kidney disease: Secondary | ICD-10-CM | POA: Diagnosis present

## 2019-06-23 DIAGNOSIS — N183 Chronic kidney disease, stage 3 unspecified: Secondary | ICD-10-CM

## 2019-06-23 DIAGNOSIS — I493 Ventricular premature depolarization: Secondary | ICD-10-CM | POA: Diagnosis present

## 2019-06-23 DIAGNOSIS — R0600 Dyspnea, unspecified: Secondary | ICD-10-CM | POA: Diagnosis not present

## 2019-06-23 DIAGNOSIS — R0902 Hypoxemia: Secondary | ICD-10-CM | POA: Diagnosis not present

## 2019-06-23 DIAGNOSIS — E876 Hypokalemia: Secondary | ICD-10-CM | POA: Diagnosis present

## 2019-06-23 DIAGNOSIS — I509 Heart failure, unspecified: Secondary | ICD-10-CM | POA: Diagnosis not present

## 2019-06-23 DIAGNOSIS — Z7401 Bed confinement status: Secondary | ICD-10-CM | POA: Diagnosis not present

## 2019-06-23 DIAGNOSIS — Z681 Body mass index (BMI) 19 or less, adult: Secondary | ICD-10-CM | POA: Diagnosis not present

## 2019-06-23 DIAGNOSIS — I2510 Atherosclerotic heart disease of native coronary artery without angina pectoris: Secondary | ICD-10-CM | POA: Diagnosis present

## 2019-06-23 DIAGNOSIS — F29 Unspecified psychosis not due to a substance or known physiological condition: Secondary | ICD-10-CM | POA: Diagnosis not present

## 2019-06-23 DIAGNOSIS — Z20822 Contact with and (suspected) exposure to covid-19: Secondary | ICD-10-CM | POA: Diagnosis present

## 2019-06-23 DIAGNOSIS — E785 Hyperlipidemia, unspecified: Secondary | ICD-10-CM | POA: Diagnosis present

## 2019-06-23 DIAGNOSIS — Z9861 Coronary angioplasty status: Secondary | ICD-10-CM | POA: Diagnosis not present

## 2019-06-23 DIAGNOSIS — F411 Generalized anxiety disorder: Secondary | ICD-10-CM | POA: Diagnosis not present

## 2019-06-23 DIAGNOSIS — Z8042 Family history of malignant neoplasm of prostate: Secondary | ICD-10-CM | POA: Diagnosis not present

## 2019-06-23 DIAGNOSIS — N179 Acute kidney failure, unspecified: Secondary | ICD-10-CM | POA: Diagnosis present

## 2019-06-23 DIAGNOSIS — I739 Peripheral vascular disease, unspecified: Secondary | ICD-10-CM | POA: Diagnosis present

## 2019-06-23 DIAGNOSIS — Z8249 Family history of ischemic heart disease and other diseases of the circulatory system: Secondary | ICD-10-CM | POA: Diagnosis not present

## 2019-06-23 DIAGNOSIS — I472 Ventricular tachycardia: Secondary | ICD-10-CM | POA: Diagnosis present

## 2019-06-23 DIAGNOSIS — F17210 Nicotine dependence, cigarettes, uncomplicated: Secondary | ICD-10-CM | POA: Diagnosis present

## 2019-06-23 DIAGNOSIS — J449 Chronic obstructive pulmonary disease, unspecified: Secondary | ICD-10-CM | POA: Diagnosis present

## 2019-06-23 DIAGNOSIS — I5082 Biventricular heart failure: Secondary | ICD-10-CM | POA: Diagnosis present

## 2019-06-23 DIAGNOSIS — I5043 Acute on chronic combined systolic (congestive) and diastolic (congestive) heart failure: Secondary | ICD-10-CM | POA: Diagnosis present

## 2019-06-23 DIAGNOSIS — R7303 Prediabetes: Secondary | ICD-10-CM | POA: Diagnosis present

## 2019-06-23 DIAGNOSIS — R52 Pain, unspecified: Secondary | ICD-10-CM | POA: Diagnosis not present

## 2019-06-23 DIAGNOSIS — M2550 Pain in unspecified joint: Secondary | ICD-10-CM | POA: Diagnosis not present

## 2019-06-23 DIAGNOSIS — E871 Hypo-osmolality and hyponatremia: Secondary | ICD-10-CM | POA: Diagnosis present

## 2019-06-23 DIAGNOSIS — N1832 Chronic kidney disease, stage 3b: Secondary | ICD-10-CM | POA: Diagnosis present

## 2019-06-23 LAB — BASIC METABOLIC PANEL
Anion gap: 12 (ref 5–15)
Anion gap: 12 (ref 5–15)
BUN: 32 mg/dL — ABNORMAL HIGH (ref 8–23)
BUN: 33 mg/dL — ABNORMAL HIGH (ref 8–23)
CO2: 18 mmol/L — ABNORMAL LOW (ref 22–32)
CO2: 21 mmol/L — ABNORMAL LOW (ref 22–32)
Calcium: 8.4 mg/dL — ABNORMAL LOW (ref 8.9–10.3)
Calcium: 8.9 mg/dL (ref 8.9–10.3)
Chloride: 102 mmol/L (ref 98–111)
Chloride: 104 mmol/L (ref 98–111)
Creatinine, Ser: 1.88 mg/dL — ABNORMAL HIGH (ref 0.44–1.00)
Creatinine, Ser: 2.01 mg/dL — ABNORMAL HIGH (ref 0.44–1.00)
GFR calc Af Amer: 32 mL/min — ABNORMAL LOW (ref >60)
GFR calc Af Amer: 29 mL/min — ABNORMAL LOW (ref >60)
GFR calc non Af Amer: 27 mL/min — ABNORMAL LOW (ref >60)
GFR calc non Af Amer: 25 mL/min — ABNORMAL LOW (ref >60)
Glucose, Bld: 233 mg/dL — ABNORMAL HIGH (ref 70–99)
Glucose, Bld: 129 mg/dL — ABNORMAL HIGH (ref 70–99)
Potassium: 3.1 mmol/L — ABNORMAL LOW (ref 3.5–5.1)
Potassium: 3.6 mmol/L (ref 3.5–5.1)
Sodium: 132 mmol/L — ABNORMAL LOW (ref 135–145)
Sodium: 137 mmol/L (ref 135–145)

## 2019-06-23 LAB — MAGNESIUM: Magnesium: 1.9 mg/dL (ref 1.7–2.4)

## 2019-06-23 LAB — CBC
HCT: 30 % — ABNORMAL LOW (ref 36.0–46.0)
Hemoglobin: 9.6 g/dL — ABNORMAL LOW (ref 12.0–15.0)
MCH: 26.4 pg (ref 26.0–34.0)
MCHC: 32 g/dL (ref 30.0–36.0)
MCV: 82.6 fL (ref 80.0–100.0)
Platelets: 116 10*3/uL — ABNORMAL LOW (ref 150–400)
RBC: 3.63 MIL/uL — ABNORMAL LOW (ref 3.87–5.11)
RDW: 26.2 % — ABNORMAL HIGH (ref 11.5–15.5)
WBC: 13.1 10*3/uL — ABNORMAL HIGH (ref 4.0–10.5)
nRBC: 0.6 % — ABNORMAL HIGH (ref 0.0–0.2)

## 2019-06-23 LAB — COOXEMETRY PANEL
Carboxyhemoglobin: 1 % (ref 0.5–1.5)
Methemoglobin: 0.6 % (ref 0.0–1.5)
O2 Saturation: 36.7 %
Total hemoglobin: 10.1 g/dL — ABNORMAL LOW (ref 12.0–16.0)

## 2019-06-23 MED ORDER — POTASSIUM CHLORIDE CRYS ER 20 MEQ PO TBCR
40.0000 meq | EXTENDED_RELEASE_TABLET | Freq: Two times a day (BID) | ORAL | Status: DC
  Administered 2019-06-23 – 2019-06-25 (×5): 40 meq via ORAL
  Filled 2019-06-23 (×5): qty 2

## 2019-06-23 MED ORDER — MAGNESIUM SULFATE 2 GM/50ML IV SOLN
2.0000 g | Freq: Once | INTRAVENOUS | Status: AC
  Administered 2019-06-23: 2 g via INTRAVENOUS
  Filled 2019-06-23: qty 50

## 2019-06-23 NOTE — Evaluation (Signed)
Physical Therapy Evaluation Patient Details Name: Leah [name] MRN: [id number] DOB: 1951-01-09 Today's Date: 06/23/2019   History of Present Illness  pt is a 69 yo female admitted with worsening HR symptoms after concern for PICC line for milrinone torn and off ~4 hours, recent admission in Feb for cardiogencic shock with pts EF reaching 15%, PMH includes viral cardiomyopathy, CAD (s/p LIMA to LAD in 11/2014), HTN, HLD< PVD, HFrEF 15%, CKD, MV replacement, s/p resection for bronchogenic lung cancer  Clinical Impression  Pt is a 69 yo female admitted for above. Pt received in bed and agreeable to PT. Per chart review, pt admitted from nursing home. Pt reporting she lives at home with her son, is independent with ADLs and ambulates with RW. Pt oriented to self and place. Pt min guard for bed mobility and min A for transfers and ambulation. Pt initially requiring mod A and unable to fully get to standing from bed. On second attempt, with increased cuing for technique and bed elevated pt required min A to get fully upright. Pt performing standing marching with min guard A and BUE support from RW. Pt ambulated within room with RW requiring min A for steadying and Rw mgt. During gait, pt reporting that at home she ambulates without a RW because she does not have one at home. Pt performed seated LE therex for improved strengthening and encouraged to perform therex on her own strengthening and preventing deconditioning. Pt received on 2L O2 via [place] with pt reporting she is not typically on O2. Os sats high 90s on RA pre and post ambulation on RA. HR in the 80s at rest and up to 112 during activity. Pt presents with dec strength, balance and endurance and would benefit from further acute PT. Recommendation to return to STR following hospital discharge.     Follow Up Recommendations SNF    Equipment Recommendations  Rolling walker with 5" wheels    Recommendations for Other Services       Precautions /  Restrictions Precautions Precautions: Fall Precaution Comments: watch HR and 02 Restrictions Weight Bearing Restrictions: No      Mobility  Bed Mobility Overal bed mobility: Needs Assistance Bed Mobility: Supine to Sit     Supine to sit: Min guard;HOB elevated     General bed mobility comments: increased time and utilization of bed rails, no physical assist needed to get EOB, min guard for safety  Transfers Overall transfer level: Needs assistance Equipment used: Rolling walker (2 wheeled) Transfers: Sit to/from Stand Sit to Stand: Min assist;From elevated surface         General transfer comment: x2 trials to stand from bed, first attempt pt unable to rise only slightly lifting bottom off bed with mod-max physical assist and pt utilizing RW, on second attempt with bed elevated, cuing for pushing up from bed and min A pt able to rise to full standing  Ambulation/Gait Ambulation/Gait assistance: Min assist Gait Distance (Feet): 10 Feet Assistive device: Rolling walker (2 wheeled) Gait Pattern/deviations: Decreased stride length;Step-through pattern Gait velocity: decreased   General Gait Details: pt ambulated within room, min A to steady and Rw mgt, cuing for posture  Stairs            Wheelchair Mobility    Modified Rankin (Stroke Patients Only)       Balance Overall balance assessment: Needs assistance Sitting-balance support: Feet supported;No upper extremity supported Sitting balance-[name]: Good     Standing balance support: Bilateral upper extremity  supported;During functional activity Standing balance-[name]: Fair Standing balance comment: reliant on RW for ambulation                             Pertinent Vitals/Pain Pain Assessment: No/denies pain    Home Living Family/patient expects to be discharged to:: Skilled nursing facility                      Prior Function Level of Independence: Needs assistance          Comments: per chart review, pt admitted from nursing home, pt reporting she lives with her son, is ind with ADLs and does not use RW     Hand Dominance        Extremity/Trunk Assessment   Upper Extremity Assessment Upper Extremity Assessment: Generalized weakness    Lower Extremity Assessment Lower Extremity Assessment: Generalized weakness    Cervical / Trunk Assessment Cervical / Trunk Assessment: Normal  Communication   Communication: No difficulties  Cognition Arousal/Alertness: Awake/alert Behavior During Therapy: WFL for tasks assessed/performed Overall Cognitive Status: No family/caregiver present to determine baseline cognitive functioning Area of Impairment: Memory                     Memory: Decreased short-term memory         General Comments: pt following commands and oriented to self and place however reporting she lives at home, initially stated she walks with RW but during ambulation pt stating she typically ambulates without AD because she doesnt have one at home      General Comments General comments (skin integrity, edema, etc.): O2 sats 98% on 2L, pt mid to upper 90s pre and post ambulation    Exercises Total Joint Exercises Ankle Circles/Pumps: AROM;Both;10 reps Long Arc Quad: AROM;Both;10 reps Marching in Standing: AROM;Both;10 reps;Seated;Standing   Assessment/Plan    PT Assessment Patient needs continued PT services  PT Problem List Decreased strength;Decreased activity tolerance;Decreased balance;Decreased mobility;Decreased knowledge of use of DME;Decreased safety awareness;Decreased knowledge of precautions;Cardiopulmonary status limiting activity       PT Treatment Interventions DME instruction;Gait training;Stair training;Functional mobility training;Therapeutic activities;Therapeutic exercise;Balance training;Neuromuscular re-education;Patient/family education    PT Goals (Current goals can be found in the Care Plan  section)  Acute Rehab PT Goals Patient Stated Goal: get better and go home PT Goal Formulation: With patient Time For Goal Achievement: 07/07/19 Potential to Achieve Goals: Fair    Frequency Min 3X/week   Barriers to discharge        Co-evaluation               AM-PAC PT "6 Clicks" Mobility  Outcome Measure Help needed turning from your back to your side while in a flat bed without using bedrails?: A Little Help needed moving from lying on your back to sitting on the side of a flat bed without using bedrails?: A Little Help needed moving to and from a bed to a chair (including a wheelchair)?: A Little Help needed standing up from a chair using your arms (e.g., wheelchair or bedside chair)?: A Little Help needed to walk in hospital room?: A Little Help needed climbing 3-5 steps with a railing? : A Lot 6 Click Score: 17    End of Session Equipment Utilized During Treatment: Gait belt Activity Tolerance: Patient tolerated treatment well;Patient limited by fatigue Patient left: in chair;with call bell/phone within reach;with chair alarm set Nurse Communication: Mobility  status PT Visit Diagnosis: Unsteadiness on feet (R26.81);Muscle weakness (generalized) (M62.81)    Time: 2671-2458 PT Time Calculation (min) (ACUTE ONLY): 31 min   Charges:   PT Evaluation $PT Eval Moderate Complexity: 1 Mod PT Treatments $Therapeutic Exercise: 8-22 mins       Lilyannah Zuelke PT, DPT 1:38 PM,06/23/19   Calvina Liptak Drucilla Chalet 06/23/2019, 1:32 PM

## 2019-06-23 NOTE — Progress Notes (Signed)
Nutrition Brief Note  Chart reviewed. Per Advanced Heart Failure Team notes, pt likely approaching end of life soon; palliative care consult pending for goals of care. Pt will likely transition to home with hospice vs residential hospice.  No further nutrition interventions warranted at this time.  Please re-consult as needed.   Loistine Chance, RD, LDN, [place] Registered Dietitian II Certified Diabetes Care and Education Specialist Please refer to [facility] for RD and/or RD on-call/weekend/after hours pager

## 2019-06-23 NOTE — Progress Notes (Signed)
Family Medicine Teaching Service Daily Progress Note Intern Pager: [phone]  Patient name: Leah [name] [facility] Medical record number: [id number] Date of birth: 1951/03/20 Age: 69 y.o. Gender: female  Primary Care Provider: Guadalupe Dawn, MD Consultants: Heart Failure Code Status: DNR  Pt Overview and Major Events to Date:  06/22/19- admitted  Assessment and Plan: Severe Biventricular Heart failure  CAD ON nursing reported three episodes of 15-25 beats of VTach. Upon review of telemetry, however, these were episodes of narrow complex tachycardia with occasional PVCs, re-labeled to NSVT. Patient did not have fully documented UOP due to 2 episodes of urinary incontinence, but reported overall high urine output from both patient and RN. Today volume status is improved: R leg with only trace edema, L leg +1 pitting edema, improved from 2+ pitting edema yesterday. JVP not as impressive today, S3 gallop present with mild tachycardia to 106. Plan to follow up on HF recommendations and palliative recommendations as they come in today. Will check 1500 BMP to assess creatinine today. -Continuous cardiac monitoring -Follow-up cardiology recommendations             - Milrinone per HF             - IV lasix 80mg  BID             -No BB due to cardiogenic shock history -Consult palliative medicine, appreciate recommendations -Strict I/Os -Daily weights -Daily CBC, BMP -1500 BMP today -Up with assistance -PT/OT -Heart healthy diet with fluid restriction -Heparin for DVT ppx  -Continue BiDil, amiodarone  AKI on CKDStage IIIB Creatinine 2.04 on admission, on most recent discharge was 1.71. Creatinine stable from yesterday afternoon at 1.88. Patient with end stage heart failure symptoms, received lasix 80mg  IV x2 yesterday, stable for more lasix dosing today. Will watch for HF recommendations today. -Reassess for lasix dosing daily -1500 BMP today -Daily BMP -Avoid other nephrotoxic  medications  Prediabetes Patient without history of DM. Glucose this morning 233, yesterday glucose 96. A1c from February 2021 6.2%. Will add CBGs to monitor. -CBG qAC, qhs  PAD, severe Patient s/p left fem-pop and L CEA, bilateral RAS s/p stenting. Home medications are clopidogrel, asa. -continue home medications  Electrolyte imbalances Patient has history of hyponatremia, hypokalemia.  Sodium and potassium within normal limits on admission. Today patient is hypokalemic to 3.1, added klor con 40 mEq BID today. Will reassess daily. Home medication includes klor con 55mEq daily. - daily BMP - Klor con 76mEq BID today -1500 BMP today  COPD Continue home medications budesonide nebulizers, albuterol PRN  Chronic Neuropathic pain Continue home meds gabapentin 200 mg qhs  FEN/GI: regular PPx: heparin  Disposition: to cardiac telemetry pending HF treatment, palliative discussion  Subjective:  Patient sitting in bed, eating, bright and alert, overall reports feeling well, less fluid on her legs, no sob, no chest pain.  Objective: Temp:  [97.3 F (36.3 C)-98.1 F (36.7 C)] 97.3 F (36.3 C) (03/31 0417) Pulse Rate:  [100-111] 109 (03/31 0417) Resp:  [11-21] 18 (03/31 0417) BP: (118-150)/(53-83) 119/53 (03/31 0417) SpO2:  [97 %-100 %] 100 % (03/31 0417) Weight:  [63 kg-63.4 kg] 63 kg (03/31 0417) Physical Exam: General: ill-appearing, older, AA woman resting comfortably in bed, NAD Cardiovascular: elevated rate, regular rhythm, S3 present, no rub or murmur, JVP diminished from yesterday Respiratory: CTAB, no increased WOB, no wheezes/rales/rhonchi  Abdomen: soft, NT, ND, normal bowel sounds Extremities: warm, dry, RLE trace edema, LLE 1+ edema  Laboratory: Recent Labs  Lab 06/22/19  1194 06/22/19 1739 06/23/19 0456  WBC 11.7* 12.9* 13.1*  HGB 10.4* 10.7* 9.6*  HCT 33.3* 33.5* 30.0*  PLT 135* 129* 116*   Recent Labs  Lab 06/22/19 0828 06/22/19 1739 06/23/19 0456  NA  138  --  132*  K 4.0  --  3.1*  CL 107  --  102  CO2 18*  --  18*  BUN 34*  --  32*  CREATININE 2.04* 1.88* 1.88*  CALCIUM 9.3  --  8.4*  PROT 5.9*  --   --   BILITOT 1.7*  --   --   ALKPHOS 118  --   --   ALT 40  --   --   AST 27  --   --   GLUCOSE 96  --  233*   Imaging/Diagnostic Tests: DG Chest Port 1 View  Result Date: 06/22/2019 CLINICAL DATA:  Shortness of breath. EXAM: PORTABLE CHEST 1 VIEW COMPARISON:  05/29/2019.  06/05/2017. FINDINGS: Central line noted with tip over SVC. Prior cardiac valve repair. Cardiomegaly. Postsurgical changes right lung. Chronic interstitial changes noted. No focal infiltrate. No pleural effusion or pneumothorax. IMPRESSION: 1.  Central line noted with tip over SVC. 2.  Prior cardiac valve repair.  Cardiomegaly. 3. Postsurgical changes right lung. Chronic interstitial changes. No acute infiltrate noted. Electronically Signed   By: Marcello Moores  Register   On: 06/22/2019 08:46   Gladys Damme, MD 06/23/2019, 6:58 AM PGY-1, [place] Intern pager: [phone], text pages welcome

## 2019-06-23 NOTE — Progress Notes (Addendum)
Advanced Heart Failure Rounding Note  PCP-Cardiologist: Peter Martinique, MD   Subjective:   Remains on milrinone 0.25 mcg. Yesterday diuresed with IV lasix. I/Onot accurate.   Tearful. Denies SOB. Upset about urinary incontinence.   Objective:   Weight Range: 63 kg Body mass index is 18.08 kg/m.   Vital Signs:   Temp:  [97.3 F (36.3 C)-98.1 F (36.7 C)] 98 F (36.7 C) (03/31 0852) Pulse Rate:  [100-111] 106 (03/31 0852) Resp:  [13-21] 20 (03/31 0852) BP: (115-150)/(53-83) 115/55 (03/31 0852) SpO2:  [96 %-100 %] 100 % (03/31 0852) Weight:  [63 kg-63.4 kg] 63 kg (03/31 0417)    Weight change: Filed Weights   06/22/19 1959 06/23/19 0417  Weight: 63.4 kg 63 kg    Intake/Output:   Intake/Output Summary (Last 24 hours) at 06/23/2019 1054 Last data filed at 06/23/2019 0928 Gross per 24 hour  Intake 600 ml  Output 300 ml  Net 300 ml      Physical Exam    General:  Sitting in the chair. No resp difficulty. Tearful.  HEENT: Normal Neck: Supple. JVP 11-12  . Carotids 2+ bilat; no bruits. No lymphadenopathy or thyromegaly appreciated. Cor: PMI nondisplaced. Regular rate & rhythm. No rubs, gallops or murmurs.  Left upper chest tunneled catheter.  Lungs: Crackles in the bases.  Abdomen: Soft, nontender, nondistended. No hepatosplenomegaly. No bruits or masses. Good bowel sounds. Extremities: No cyanosis, clubbing, rash, R and LLE 1-2+ edema Neuro: Alert & orientedx3, cranial nerves grossly intact. moves all 4 extremities w/o difficulty. Affect flat    Telemetry   SR with NSVT   EKG   n/a  Labs    CBC Recent Labs    06/22/19 0828 06/22/19 0828 06/22/19 1739 06/23/19 0456  WBC 11.7*   < > 12.9* 13.1*  NEUTROABS 10.2*  --   --   --   HGB 10.4*   < > 10.7* 9.6*  HCT 33.3*   < > 33.5* 30.0*  MCV 83.9   < > 82.7 82.6  PLT 135*   < > 129* 116*   < > = values in this interval not displayed.   Basic Metabolic Panel Recent Labs    06/22/19 0828  06/22/19 0828 06/22/19 1739 06/23/19 0456  NA 138  --   --  132*  K 4.0  --   --  3.1*  CL 107  --   --  102  CO2 18*  --   --  18*  GLUCOSE 96  --   --  233*  BUN 34*  --   --  32*  CREATININE 2.04*   < > 1.88* 1.88*  CALCIUM 9.3  --   --  8.4*  MG  --   --   --  1.9   < > = values in this interval not displayed.   Liver Function Tests Recent Labs    06/22/19 0828  AST 27  ALT 40  ALKPHOS 118  BILITOT 1.7*  PROT 5.9*  ALBUMIN 3.0*   Recent Labs    06/22/19 0828  LIPASE 15   Cardiac Enzymes No results for input(s): CKTOTAL, CKMB, CKMBINDEX, TROPONINI in the last 72 hours.  BNP: BNP (last 3 results) Recent Labs    05/18/19 1707 05/21/19 1206 06/22/19 0828  BNP >4,500.0* 1,490.6* >4,500.0*    ProBNP (last 3 results) No results for input(s): PROBNP in the last 8760 hours.   D-Dimer No results for input(s): DDIMER in the last  72 hours. Hemoglobin A1C No results for input(s): HGBA1C in the last 72 hours. Fasting Lipid Panel No results for input(s): CHOL, HDL, LDLCALC, TRIG, CHOLHDL, LDLDIRECT in the last 72 hours. Thyroid Function Tests No results for input(s): TSH, T4TOTAL, T3FREE, THYROIDAB in the last 72 hours.  Invalid input(s): FREET3  Other results:   Imaging     No results found.   Medications:     Scheduled Medications: . amiodarone  200 mg Oral BID  . aspirin EC  81 mg Oral Daily  . budesonide (PULMICORT) nebulizer solution  0.25 mg Nebulization BID  . clopidogrel  75 mg Oral Daily  . feeding supplement (ENSURE ENLIVE)  237 mL Oral TID BM  . furosemide  80 mg Intravenous BID  . gabapentin  200 mg Oral QHS  . Gerhardt's butt cream  1 application Topical Daily  . heparin  5,000 Units Subcutaneous Q8H  . isosorbide-hydrALAZINE  1 tablet Oral TID  . nicotine  14 mg Transdermal Daily  . potassium chloride  40 mEq Oral BID  . senna  1 tablet Oral Daily     Infusions: . milrinone 0.25 mcg/kg/min (06/23/19 0433)     PRN  Medications:  albuterol, polyethylene glycol     Assessment/Plan   1. End Stage A/C Biventricular HF  ECHO 04/2019 Biventricular HF EF 15%.  On home milrinone 0.25 mcg.  - Continue milrinone 0.25 mcg. Check CO-OX. Set up CVP  -Volume status improving. Continue  80 mg IV lasix twice a day.  - No bb with shock. -Watch renal function closely.  - Need Palliative Care for goals of care. - Followed by [facility] for milrinone  2. CAD -s/p CABG with LIMA to LAD and MVR (bioprosthetic -No chest pain  -HS Trop 132>140   3. Moderate to Severe AI   4. Lung Cancer S/P Resection  5. PAD, severe s/p left fem-pop and L CEA - bilateral RAS s/p stenting  6.  CKD Stage IIIb  -Creatinine 2.04.  -Follow renal function.  7. PVCs   8.NSVT K and Mag low Supp K and give 2 grams Mag   Needs Palliative Care for [place].    Length of Stay: 0  Darrick Grinder, NP  06/23/2019, 10:54 AM  Advanced Heart Failure Team Pager [phone] (M-F; 7a - 4p)  Please contact [facility] for night-coverage after hours (4p -7a ) and weekends on [email or web address]  Patient seen and examined with the above-signed Advanced Practice Provider and/or Housestaff. I personally reviewed laboratory data, imaging studies and relevant notes. I independently examined the patient and formulated the important aspects of the plan. I have edited the note to reflect any of my changes or salient points. I have personally discussed the plan with the patient and/or family.  She is clearly end-stage. Co-ox 37% on milrinone 0.25. Remains weak and volume overloaded. Cold.   General:  Frail weak appearing. No resp difficulty HEENT: normal Neck: supple. JVP to jaw. Carotids 2+ bilat; no bruits. No lymphadenopathy or thryomegaly appreciated. Cor: PMI nondisplaced. RRR + s3 Lungs: clear Abdomen: soft, nontender, nondistended. No hepatosplenomegaly. No bruits or masses. Good bowel sounds. Extremities: no cyanosis, clubbing, rash, 1-2+ edema  +  cool Neuro: alert & orientedx3, cranial nerves grossly intact. moves all 4 extremities w/o difficulty. Affect pleasant  She is end-stage. Remains in shock despite milrinone support. Long discussion about lack of options. Palliative care team involved. WIll need home or residential hospice care.   Glori Bickers, MD  5:15 PM

## 2019-06-23 NOTE — Plan of Care (Signed)

## 2019-06-23 NOTE — Progress Notes (Signed)
PT had 20 beat run of vtach. Pt asymptomatic. Was asleep. On call provider notified.

## 2019-06-23 NOTE — Discharge Summary (Addendum)
[facility] Discharge Summary  Patient name: Leah [name] [facility] Medical record number: [id number] Date of birth: 1950/09/01 Age: 69 y.o. Gender: female Date of Admission: 06/22/2019  Date of Discharge: 06/25/2019 Admitting Physician: Richarda Osmond, DO  Primary Care Provider: Guadalupe Dawn, MD Consultants: HF  Indication for Hospitalization: Heart Failure exacerbation  Discharge Diagnoses/Problem List:  SevereBiventricularHeart failureCAD AKI on CKDStage IIIB Electrolyte imbalances Prediabetes PAD, severe COPD Chronic Neuropathic pain  Disposition: Home with hospice  Discharge Condition: Stable  Discharge Exam:   General: Appears unwell, no acute distress. Age appropriate. Cardiac: RRR, normal heart sounds, no murmurs Respiratory: CTAB, normal effort Extremities: BLE edema w/o cyanosis.   Brief Hospital Course:  Leah [name] is a 69 y.o. female presenting with SOB, concern milrinone PICC torn. PMH is significant for viral cardiomyopathy, CAD, HTN, HLD, PVD, HFrEF (15%), CKD, MV replacement, s/p resection for bronchogenic lung cancer, tobacco use.  Her hospital course is outlined below.  Patient presented with worsening of heart failure symptoms and concern for PICC line being torn, as she resides in SNF with chronic milrinone use.  Admission details can be found in H&P.  BNP on admission was elevated to >4500.  Advanced heart failure team was contacted on admission by ED given patient's ongoing relationship with them.  She was admitted, continued on her home medications.  Palliative care was consulted for goals of care and patient preferred home hospice. She was discharged home with home hospice in stable condition.   Issues for Follow Up:  1. Consider de-escalating medications such as plavix if patient decides full comfort care. 2. Reassess needs to increase new medications: xanax, oxycodone  Significant Procedures:  N/A  Significant Labs and Imaging:  Recent Labs  Lab 06/22/19 1739 06/23/19 0456 06/24/19 0529  WBC 12.9* 13.1* 13.2*  HGB 10.7* 9.6* 10.1*  HCT 33.5* 30.0* 30.9*  PLT 129* 116* 106*   Recent Labs  Lab 06/22/19 0828 06/22/19 1739 06/23/19 0456  NA 138  --  132*  K 4.0  --  3.1*  CL 107  --  102  CO2 18*  --  18*  GLUCOSE 96  --  233*  BUN 34*  --  32*  CREATININE 2.04* 1.88* 1.88*  CALCIUM 9.3  --  8.4*  MG  --   --  1.9  ALKPHOS 118  --   --   AST 27  --   --   ALT 40  --   --   ALBUMIN 3.0*  --   --     Results/Tests Pending at Time of Discharge: N/A  Discharge Medications:  Allergies as of 06/25/2019      Reactions   Lisinopril Swelling   Angioedema 06/10/11   Chantix [varenicline] Other (See Comments)   Caused insomnia   Penicillins Hives   Did it involve swelling of the face/tongue/throat, SOB, or low BP? No Did it involve sudden or severe rash/hives, skin peeling, or any reaction on the inside of your mouth or nose? No Did you need to seek medical attention at a hospital or doctor's office? No When did it last happen?50 Years If all above answers are "NO", may proceed with cephalosporin use.      Medication List    TAKE these medications   albuterol 108 (90 Base) MCG/ACT inhaler Commonly known as: VENTOLIN HFA Inhale 2 puffs into the lungs every 6 (six) hours as needed for wheezing or shortness of breath.   ALPRAZolam 0.25 MG  tablet Commonly known as: XANAX Take 1 tablet (0.25 mg total) by mouth 3 (three) times daily as needed for anxiety or sleep.   amiodarone 200 MG tablet Commonly known as: PACERONE Take 1 tablet (200 mg total) by mouth daily. What changed: when to take this   aspirin EC 81 MG tablet Take 1 tablet (81 mg total) by mouth daily.   BiDil 20-37.5 MG tablet Generic drug: isosorbide-hydrALAZINE Take 1 tablet by mouth 3 (three) times daily.   Cerovite Senior Tabs Take 1 tablet by mouth daily.   clopidogrel 75 MG  tablet Commonly known as: PLAVIX Take 1 tablet (75 mg total) by mouth daily.   cyclobenzaprine 10 MG tablet Commonly known as: FLEXERIL Take 1 tablet (10 mg total) by mouth 3 (three) times daily as needed for muscle spasms.   docusate sodium 100 MG capsule Commonly known as: COLACE Take 100 mg by mouth daily.   Nutritional Supplement Liqd Take 120 mLs by mouth 2 (two) times daily. MEDPASS   feeding supplement (ENSURE ENLIVE) Liqd Take 237 mLs by mouth 3 (three) times daily between meals.   Flovent HFA 44 MCG/ACT inhaler Generic drug: fluticasone Inhale 1 puff into the lungs daily.   furosemide 40 MG tablet Commonly known as: LASIX Take 1 tablet (40 mg total) by mouth daily.   gabapentin 100 MG capsule Commonly known as: NEURONTIN Take 2 capsules (200 mg total) by mouth at bedtime.   Gerhardt's butt cream Crea Apply 1 application topically daily. Apply to affected areas once daly   milrinone 20 MG/100 ML Soln infusion Commonly known as: PRIMACOR Inject 0.0159 mg/min into the vein continuous.   multivitamin with minerals Tabs tablet Take 1 tablet by mouth daily.   oxyCODONE 5 MG immediate release tablet Commonly known as: Oxy IR/ROXICODONE Take 1 tablet (5 mg total) by mouth every 4 (four) hours as needed for moderate pain (dyspnea/air hunger/tachypnea).   OXYGEN Inhale 2 L into the lungs continuous.   polyethylene glycol 17 g packet Commonly known as: MIRALAX / GLYCOLAX Take 17 g by mouth daily as needed for mild constipation.   potassium chloride SA 20 MEQ tablet Commonly known as: KLOR-CON Take 20 mEq by mouth 2 (two) times daily.   senna 8.6 MG Tabs tablet Commonly known as: SENOKOT Take 1 tablet (8.6 mg total) by mouth daily.            Durable Medical Equipment  (From admission, onward)         Start     Ordered   06/24/19 1232  Heart failure home health orders  (Heart failure home health orders / Face to face)  Once    Comments: Heart  Failure Follow-up Care:  Verify follow-up appointments per Patient Discharge Instructions. Confirm transportation arranged. Reconcile home medications with discharge medication list. Remove discontinued medications from use. Assist patient/caregiver to manage medications using pill box. Reinforce low sodium food selection Assessments: Vital signs and oxygen saturation at each visit. Assess home environment for safety concerns, caregiver support and availability of low-sodium foods. Consult Education officer, museum, PT/OT, Dietitian, and CNA based on assessments. Perform comprehensive cardiopulmonary assessment. Notify MD for any change in condition or weight gain of 3 pounds in one day or 5 pounds in one week with symptoms. Daily Weights and Symptom Monitoring: Ensure patient has access to scales. Teach patient/caregiver to weigh daily before breakfast and after voiding using same scale and record.    Teach patient/caregiver to track weight and symptoms and when to  notify Provider. Activity: Develop individualized activity plan with patient/caregiver.   HOme milrnone 0.25 mcg/kg/min  Home Paraenteral Inotropic Therapy : Data Collection Form  Patients name: Leah [name] [place]   Date: 06/24/19  Information below may not be completed by the supplier nor anyone in a Financial relationship with the supplier.  1. Results of invasive hemodynamic monitoring  Cardiac Index Before Inotrope infusion:         NYHA IV             On Inotrope infusion:          1.3               Drug and dose:   1.6   2. Cardiac medications immediately prior to inotrope infusion (List name, dose, and frequency)  Lasix   3. Dose this represent maximum tolerated doses of these medications? Yes.   4. Breathing status Prior to inotrope infusion: Dyspnea at rest  At time of discharge: Dyspnea on moderate mod exertion.   5. Initial home prescription Drug and Dose:   0.25 mcg /kg/min  for continuous infusion 24/hr day and  7 days/week  6. If continuous infusion is prescribed, have attempts to discontinue inotrope infusion in the hospital failed?   Yes.   7. If intermittent infusion is prescribed, have there been repeated hospitalizations for heart failure which Parenteral inotrope were required? Not applicable.   8. Is patient capable of going to the physician for outpatient evaluation? Yes.   9. Is routine electrocardiographic monitoring required in the Home?  No.   The above statements and any additional explanations included separately are true and accurate and there is documentation present in the patients medical record to support these statements.   Completed by Darrick Grinder, NP   In instances where this form was completed by an Advanced Practice Provider, please see EMR for physician Co-Signature.  [facility] to provide  Labs every other week to include BMET, Mg, and CBC with Diff. Additional as needed. Should be drawn via PERIPHERAL stick. NOT PICC line.   P9150 Milrinone 0.25  mcg/kg/min X 52 weeks A4221 Supplies for maintenance of drug infusion catheter A4222 Supplies for the external drug infusion per cassette or bag E0781 Ambulatory Infusion pump  Question Answer Comment  Heart Failure Follow-up Care [facility] ([facility]) [facility] at [phone]   Lab frequency Other see comments   Fax lab results to AHF Clinic at [phone]   Diet Low Sodium Heart Healthy   Fluid restrictions: 2000 mL Fluid      06/24/19 1236          Discharge Instructions: Please refer to Patient Instructions section of EMR for full details.  Patient was counseled important signs and symptoms that should prompt return to medical care, changes in medications, dietary instructions, activity restrictions, and follow up appointments.   Follow-Up Appointments:   Future Appointments  Date Time Provider [place]  07/08/2019 10:00 AM [facility] PA/NP [facility] None  08/02/2019  1:40 PM Martinique, Peter M, MD [facility]  [facility]   Autry-Lott, Bagley, DO 06/25/2019, 2:26 PM PGY-1, [place]

## 2019-06-23 NOTE — Consult Note (Signed)
Consultation Note Date: 06/23/2019   Patient Name: Leah [name]  DOB: Aug 23, 1950  MRN: [id number]  Age / Sex: 69 y.o., female  PCP: Guadalupe Dawn, MD Referring Physician: Martyn Malay, MD  Reason for Consultation: Establishing goals of care  HPI/Patient Profile: 69 y.o. female  with past medical history of end-stage a/c biventricular HF EF 15% on home milrinone, viral cardiomyopathy, CAD s/p CABG, severe PAD s/p left fem-pop , HTN, HLD, MV replacement, s/p resection for bronchogenic lung cancer, tobacco use admitted on 06/22/2019 with shortness of breath and issues with milrinone infusion/PICC line from SNF rehab. Heart failure team following. Patient receiving milrinone infusion and lasix IV BID. Recommending hospice services, as patient is end-stage heart failure. Palliative medicine consultation for goals of care/hospice discussion.   Clinical Assessment and Goals of Care:  I have reviewed medical records, discussed with care team, and met with patient at bedside. Marieta wakes to voice. She denies pain or shortness of breath. She is initially confused and surprised to hear she is at the hospital. She thought she was at [facility]. Easily reoriented to place, time, situation. No family at bedside.    Patient known to PMT from previous admission. Multiple notes reviewed.   I introduced Palliative Medicine as specialized medical care for people living with serious illness. It focuses on providing relief from the symptoms and stress of a serious illness. The goal is to improve quality of life for both the patient and the family.   Discussed events leading up to admission, course of hospitalization including diagnoses, interventions, plan of care. Discussed recommendations from heart failure team, including unfortunate end-stage heart failure and recommendation for hospice services. Patient shares thoughts  about 'hospice being where people go to die.' Frankly and compassionately shared concern with poor prognosis due to end-stage heart failure and importance of discussing options with her family.   Jazzma recalls her conversation with my colleague, Elmo Putt regarding wishes to 'go out' peacefully versus pursuing aggressive measure at EOL. I agreed with this decision, sharing that life support measures would only prolong the inevitable with progressive heart failure despite aggressive medical management.   Briefly discussed hospice options and focus on comfort/symptom management. Again reiterated importance of family conference for best steps moving forward. Patient gives permission for me to call son, [name] to arrange family meeting.    Answered questions. Reassured of ongoing support this admission.    **Spoke with son, [name] via telephone. Introduced role of palliative medicine. [name] is familiar with PMT from his mother's previous admission. Discussed diagnoses, interventions, plan of care. He understands the severity of her heart condition. Reviewed recommendations from HF team including hospice services. [name]'s three uncle's are on speaker phone. Uncle's would like to be present for [name] discussion tomorrow. Will approve 1x visit for [place] discussion with family tomorrow at [employer] . [employer] notified to allow: [facility], Mertha Baars, [name], and [name].   Son, [name] has PMT contact information. Updated Dr. Chauncey Reading and RN.     SUMMARY OF  RECOMMENDATIONS    Continue current plan of care and medical management.  Palliative GOC meeting with patient/family tomorrow, 4/1 at 11am. Further recommendations to follow.   Code Status/Advance Care Planning:  DNR  Symptom Management:   Per attending  Palliative Prophylaxis:   Aspiration, Delirium Protocol, Frequent Pain Assessment, Oral Care and Turn Reposition  Psycho-social/Spiritual:   Desire for further  Chaplaincy support: yes  Additional Recommendations: Caregiving  Support/Resources, Compassionate Wean Education and Education on Hospice  Prognosis:   Poor prognosis with end-stage biventricular heart failure  Discharge Planning: To Be Determined      Primary Diagnoses: Present on Admission: . PVD- s/p multiple proceedures . Acute on chronic diastolic congestive heart failure ([place]) . Moderate aortic regurgitation   I have reviewed the medical record, interviewed the patient and family, and examined the patient. The following aspects are pertinent.  Past Medical History:  Diagnosis Date  . Anemia   . Bronchogenic lung cancer, right ([place]) 11/29/2016  . CAD (coronary artery disease)    a. s/p LIMA-LAD in 11/2014  . Carotid artery occlusion   . CHF (congestive heart failure) ([place])   . Complication of anesthesia    slow to awaken x 1 maybe 2001  . COPD (chronic obstructive pulmonary disease) ([place])   . GERD (gastroesophageal reflux disease)    "sometimes" takes Copywriter, advertising or drinks gingerale   . Headache(784.0)   . History of kidney stones   . Hyperlipidemia   . Hypertension   . Leg pain   . Peripheral vascular disease ([place])   . Renal vascular disease 10/26/2014   bilateral stents placed   . Severe mitral regurgitation    a. s/p MVR in 11/2014 with a pericardial tissue valve  . Shortness of breath dyspnea   . Tobacco abuse    Social History   Socioeconomic History  . Marital status: Divorced    Spouse name: Not on file  . Number of children: 4  . Years of education: Not on file  . Highest education level: Not on file  Occupational History  . Occupation: retired  Tobacco Use  . Smoking status: Current Every Day Smoker    Packs/day: 0.50    Years: 35.00    Pack years: 17.50    Types: Cigarettes  . Smokeless tobacco: Never Used  Substance and Sexual Activity  . Alcohol use: Yes    Alcohol/week: 0.0 standard drinks    Comment: occasion  . Drug use: No  .  Sexual activity: Never  Other Topics Concern  . Not on file  Social History Narrative  . Not on file   Social Determinants of Health   Financial Resource Strain:   . Difficulty of Paying Living Expenses:   Food Insecurity:   . Worried About Charity fundraiser in the Last Year:   . Arboriculturist in the Last Year:   Transportation Needs:   . Film/video editor (Medical):   Marland Kitchen Lack of Transportation (Non-Medical):   Physical Activity:   . Days of Exercise per Week:   . Minutes of Exercise per Session:   Stress:   . Feeling of Stress :   Social Connections:   . Frequency of Communication with Friends and Family:   . Frequency of Social Gatherings with Friends and Family:   . Attends Religious Services:   . Active Member of Clubs or Organizations:   . Attends Archivist Meetings:   Marland Kitchen Marital Status:    Family  History  Problem Relation Age of Onset  . Cancer Mother        BRAIN AND LUNG  . Hypertension Mother   . Hypertension Father   . Heart disease Father   . Prostate cancer Father   . Kidney disease Father   . Hypertension Brother   . ALS Brother   . Stroke Paternal Aunt        great aunt  . Heart attack Neg Hx   . Colon cancer Neg Hx   . Stomach cancer Neg Hx   . Rectal cancer Neg Hx   . Esophageal cancer Neg Hx   . Liver cancer Neg Hx    Scheduled Meds: . amiodarone  200 mg Oral BID  . aspirin EC  81 mg Oral Daily  . budesonide (PULMICORT) nebulizer solution  0.25 mg Nebulization BID  . clopidogrel  75 mg Oral Daily  . feeding supplement (ENSURE ENLIVE)  237 mL Oral TID BM  . furosemide  80 mg Intravenous BID  . gabapentin  200 mg Oral QHS  . Gerhardt's butt cream  1 application Topical Daily  . heparin  5,000 Units Subcutaneous Q8H  . isosorbide-hydrALAZINE  1 tablet Oral TID  . nicotine  14 mg Transdermal Daily  . potassium chloride  40 mEq Oral BID  . senna  1 tablet Oral Daily   Continuous Infusions: . milrinone 0.25 mcg/kg/min  (06/23/19 0433)   PRN Meds:.albuterol, polyethylene glycol Medications Prior to Admission:  Prior to Admission medications   Medication Sig Start Date End Date Taking? Authorizing Provider  albuterol (PROVENTIL HFA;VENTOLIN HFA) 108 (90 Base) MCG/ACT inhaler Inhale 2 puffs into the lungs every 6 (six) hours as needed for wheezing or shortness of breath. 02/20/16  Yes Archie Patten, MD  amiodarone (PACERONE) 200 MG tablet Take 1 tablet (200 mg total) by mouth 2 (two) times daily. 06/03/19  Yes [name], MD  aspirin EC 81 MG tablet Take 1 tablet (81 mg total) by mouth daily. 06/03/19  Yes [name], MD  clopidogrel (PLAVIX) 75 MG tablet Take 1 tablet (75 mg total) by mouth daily. 06/03/19  Yes [name], MD  cyclobenzaprine (FLEXERIL) 10 MG tablet Take 1 tablet (10 mg total) by mouth 3 (three) times daily as needed for muscle spasms. 06/03/19  Yes [name], MD  docusate sodium (COLACE) 100 MG capsule Take 100 mg by mouth daily.   Yes [provider]  fluticasone (FLOVENT HFA) 44 MCG/ACT inhaler Inhale 1 puff into the lungs daily. 04/12/19  Yes Guadalupe Dawn, MD  furosemide (LASIX) 40 MG tablet Take 1 tablet (40 mg total) by mouth daily. 06/03/19  Yes [name], MD  gabapentin (NEURONTIN) 100 MG capsule Take 2 capsules (200 mg total) by mouth at bedtime. 06/03/19  Yes [name], MD  Hydrocortisone (GERHARDT'S BUTT CREAM) CREA Apply 1 application topically daily. Apply to affected areas once daly 06/03/19  Yes [name], MD  isosorbide-hydrALAZINE (BIDIL) 20-37.5 MG tablet Take 1 tablet by mouth 3 (three) times daily. 06/03/19  Yes [name], MD  milrinone (PRIMACOR) 20 MG/100 ML SOLN infusion Inject 0.0159 mg/min into the vein continuous. 06/03/19  Yes [name], MD  Multiple Vitamins-Minerals (CEROVITE SENIOR) TABS Take 1 tablet by mouth daily.   Yes [provider]  Nutritional Supplement LIQD Take 120 mLs by mouth 2 (two) times daily. MEDPASS    Yes [provider]  OXYGEN Inhale 2 L into the lungs continuous.   Yes [provider]  polyethylene glycol (MIRALAX / GLYCOLAX) 17 g packet Take 17 g by mouth daily as needed for mild constipation. 06/03/19  Yes [name], MD  potassium chloride SA (KLOR-CON) 20 MEQ tablet Take 20 mEq by mouth 2 (two) times daily.   Yes [provider]  senna (SENOKOT) 8.6 MG TABS tablet Take 1 tablet (8.6 mg total) by mouth daily. 06/03/19  Yes [name], MD  feeding supplement, ENSURE ENLIVE, (ENSURE ENLIVE) LIQD Take 237 mLs by mouth 3 (three) times daily between meals. 06/03/19   [name], MD  Multiple Vitamin (MULTIVITAMIN WITH MINERALS) TABS tablet Take 1 tablet by mouth daily. Patient not taking: Reported on 06/22/2019 06/03/19   [name], MD   Allergies  Allergen Reactions  . Lisinopril Swelling    Angioedema 06/10/11  . Chantix [Varenicline] Other (See Comments)    Caused insomnia  . Penicillins Hives    Did it involve swelling of the face/tongue/throat, SOB, or low BP? No Did it involve sudden or severe rash/hives, skin peeling, or any reaction on the inside of your mouth or nose? No Did you need to seek medical attention at a hospital or doctor's office? No When did it last happen?50 Years If all above answers are "NO", may proceed with cephalosporin use.     Review of Systems  Respiratory: Positive for shortness of breath.    Physical Exam Vitals and nursing note reviewed.  Constitutional:      Appearance: She is ill-appearing.  HENT:     Head: Normocephalic and atraumatic.  Cardiovascular:     Rate and Rhythm: Regular rhythm.     Heart sounds: Murmur present.  Pulmonary:     Effort: No tachypnea, accessory muscle usage or respiratory distress.  Skin:    General: Skin is warm and dry.  Neurological:     Mental Status: She is alert, oriented to person, place, and time and easily aroused.     Comments: Easily re-oriented. Drowsy    Psychiatric:        Attention and Perception: She is inattentive.        Speech: Speech is delayed.    Vital Signs: BP 138/79 (BP Location: Right Arm)   Pulse (!) 108   Temp (!) 97.4 F (36.3 C) (Oral)   Resp 20   Ht 6' 1.5" (1.867 m)   Wt 63 kg Comment: scale c  SpO2 100%   BMI 18.08 kg/m  Pain Scale: 0-10   Pain Score: 0-No pain   SpO2: SpO2: 100 % O2 Device:SpO2: 100 % O2 Flow Rate: .O2 Flow Rate (L/min): 2 L/min  IO: Intake/output summary:   Intake/Output Summary (Last 24 hours) at 06/23/2019 1447 Last data filed at 06/23/2019 1252 Gross per 24 hour  Intake 840 ml  Output 300 ml  Net 540 ml    LBM:   Baseline Weight: Weight: 63.4 kg(scale c) Most recent weight: Weight: 63 kg(scale c)     Palliative Assessment/Data: PPS 40%      Time In: 1415 Time Out: 1530 Time Total: 75 Greater than 50%  of this time was spent counseling and coordinating care related to the above assessment and plan.  Signed by:  Ihor Dow, DNP, FNP-C Palliative Medicine Team  Phone: [phone] Fax: [phone]   Please contact Palliative Medicine Team phone at [phone] for questions and concerns.  For individual provider: See Shea Evans

## 2019-06-23 NOTE — Evaluation (Signed)
Occupational Therapy Evaluation Patient Details Name: Leah [name] MRN: [id number] DOB: 1950/07/18 Today's Date: 06/23/2019    History of Present Illness Pt is a 69 yo female admitted with worsening HR symptoms after concern for PICC line for milrinone torn and off ~4 hours, recent admission in Feb for cardiogencic shock with pts EF reaching 15%, PMH includes viral cardiomyopathy, CAD (s/p LIMA to LAD in 11/2014), HTN, HLD< PVD, HFrEF 15%, CKD, MV replacement, s/p resection for bronchogenic lung cancer   Clinical Impression   Pt PTA: Pt admitted from SNF. Pt was living at home prior with son. Pt currently performing ADL with modA overall and minA for mobility with RW. Pt requiring modA for power-up from commode due to poor activity tolerance. Pt displaying decreased ability to care for self, decreased activity tolerance and decreased strength. Pt A/O, unaware of decreased strength as a main deficit. Pt's O2 94% on RA; no O2 on after toileting; pt reapplied while eating. Pt would benefit from continued OT skilled services for ADL, mobility and safety in SNF setting. OT following acutely.      Follow Up Recommendations  SNF;Supervision/Assistance - 24 hour    Equipment Recommendations  Other (comment)(deferred to next venue)    Recommendations for Other Services       Precautions / Restrictions Precautions Precautions: Fall Precaution Comments: watch HR and 02 Restrictions Weight Bearing Restrictions: No      Mobility Bed Mobility Overal bed mobility: Needs Assistance Bed Mobility: Sit to Supine     Supine to sit: Min guard;HOB elevated Sit to supine: Min assist   General bed mobility comments: MinA to assist with BLEs onto bed and scooting towards HOB  Transfers Overall transfer level: Needs assistance Equipment used: Rolling walker (2 wheeled) Transfers: Sit to/from Stand Sit to Stand: Min assist;Mod assist         General transfer comment: MinA for stand to  sit; modA from commode for sit to stand    Balance Overall balance assessment: Needs assistance Sitting-balance support: Feet supported;No upper extremity supported Sitting balance-[name]: Good     Standing balance support: Bilateral upper extremity supported;During functional activity Standing balance-[name]: Fair Standing balance comment: reliant on RW for ambulation                           ADL either performed or assessed with clinical judgement   ADL Overall ADL's : Needs assistance/impaired Eating/Feeding: Set up;Sitting   Grooming: Minimal assistance;Standing   Upper Body Bathing: Minimal assistance;Sitting   Lower Body Bathing: Moderate assistance;Sit to/from stand   Upper Body Dressing : Minimal assistance;Sitting   Lower Body Dressing: Moderate assistance;Sitting/lateral leans;Sit to/from stand   Toilet Transfer: Moderate assistance;Stand-pivot;Regular Toilet;Grab bars;RW Armed forces technical officer Details (indicate cue type and reason): Pt transferring on commode with RW present, but pt requiring modA overall for power-up as pt using grab bars, but requiring assist. Toileting- Clothing Manipulation and Hygiene: Min guard;Sitting/lateral lean;Sit to/from stand       Functional mobility during ADLs: Minimal assistance;Cueing for safety;Rolling walker General ADL Comments: Pt displaying decreased ability to care for self, decreased activity tolerance and decreased strength.     Vision Baseline Vision/History: No visual deficits Patient Visual Report: No change from baseline Vision Assessment?: No apparent visual deficits     Perception     Praxis      Pertinent Vitals/Pain Pain Assessment: 0-10 Pain Score: 3  Pain Location: bilat legs Pain Descriptors / Indicators:  Discomfort Pain Intervention(s): Monitored during session;Repositioned     Hand Dominance     Extremity/Trunk Assessment Upper Extremity Assessment Upper Extremity Assessment:  Generalized weakness   Lower Extremity Assessment Lower Extremity Assessment: Generalized weakness   Cervical / Trunk Assessment Cervical / Trunk Assessment: Normal   Communication Communication Communication: No difficulties   Cognition Arousal/Alertness: Awake/alert Behavior During Therapy: WFL for tasks assessed/performed Overall Cognitive Status: No family/caregiver present to determine baseline cognitive functioning Area of Impairment: Awareness;Memory;Safety/judgement                     Memory: Decreased short-term memory   Safety/Judgement: Decreased awareness of deficits Awareness: Intellectual   General Comments: Pt following commands and requiring assist to mobilize pt as she was unaware of her new weakness.   General Comments  O2 94% on RA; no O2 on after toileting; pt reapplied while eating.    Exercises Total Joint Exercises Ankle Circles/Pumps: AROM;Both;10 reps Long Arc Quad: AROM;Both;10 reps Marching in Standing: AROM;Both;10 reps;Seated;Standing   Shoulder Instructions      Home Living Family/patient expects to be discharged to:: Skilled nursing facility Living Arrangements: Children Available Help at Discharge: Family;Available 24 hours/day Type of Home: Apartment Home Access: Stairs to enter Entrance Stairs-Number of Steps: flight, no Theatre manager Stairs-Rails: Right Home Layout: One level     Bathroom Shower/Tub: Occupational psychologist: Standard     Home Equipment: None          Prior Functioning/Environment Level of Independence: Needs assistance  Gait / Transfers Assistance Needed: RW for mobility;  assist from son for transfers. ADL's / Homemaking Assistance Needed: Pt reports independence with ADL tasks   Comments: per chart review, pt admitted from nursing home, pt reporting she lives with her son, is ind with ADLs and does not use RW        OT Problem List: Decreased strength;Decreased knowledge of use  of DME or AE;Decreased knowledge of precautions;Decreased activity tolerance;Cardiopulmonary status limiting activity;Impaired balance (sitting and/or standing)      OT Treatment/Interventions: Self-care/ADL training;Therapeutic exercise;Patient/family education;Balance training;Energy conservation;Therapeutic activities;DME and/or AE instruction    OT Goals(Current goals can be found in the care plan section) Acute Rehab OT Goals Patient Stated Goal: get better and go home OT Goal Formulation: With patient Time For Goal Achievement: 07/07/19 Potential to Achieve Goals: Good ADL Goals Pt Will Perform Grooming: with supervision;standing Pt Will Transfer to Toilet: with supervision;ambulating;regular height toilet;grab bars Pt Will Perform Toileting - Clothing Manipulation and hygiene: with supervision;sitting/lateral leans;sit to/from stand Additional ADL Goal #1: Pt will integrate 1-2 energy conservation techniques in order to increase independence with ADL and mobility. Additional ADL Goal #2: Pt will stand with minguardA x5 mins for ADL tasks at sink to increase activity tolerance in prep for higher level ADL.  OT Frequency: Min 2X/week   Barriers to D/C:            Co-evaluation              AM-PAC OT "6 Clicks" Daily Activity     Outcome Measure Help from another person eating meals?: None Help from another person taking care of personal grooming?: A Little Help from another person toileting, which includes using toliet, bedpan, or urinal?: A Little Help from another person bathing (including washing, rinsing, drying)?: A Little Help from another person to put on and taking off regular upper body clothing?: A Little Help from another person to put on and taking  off regular lower body clothing?: A Little 6 Click Score: 19   End of Session Equipment Utilized During Treatment: Rolling walker;Gait belt Nurse Communication: Mobility status  Activity Tolerance: Patient  limited by fatigue Patient left: in bed;with call bell/phone within reach;with bed alarm set  OT Visit Diagnosis: Unsteadiness on feet (R26.81);Muscle weakness (generalized) (M62.81)                Time: 0475-3391 OT Time Calculation (min): 16 min Charges:  OT General Charges $OT Visit: 1 Visit OT Evaluation $OT Eval Moderate Complexity: 1 Mod  Jefferey Pica, OTR/L Acute Rehabilitation Services Pager: [phone] Office: [phone]   Bronte Sabado C 06/23/2019, 4:16 PM

## 2019-06-24 ENCOUNTER — Telehealth: Payer: Self-pay

## 2019-06-24 DIAGNOSIS — R0600 Dyspnea, unspecified: Secondary | ICD-10-CM

## 2019-06-24 DIAGNOSIS — I5084 End stage heart failure: Secondary | ICD-10-CM

## 2019-06-24 DIAGNOSIS — F411 Generalized anxiety disorder: Secondary | ICD-10-CM

## 2019-06-24 LAB — BASIC METABOLIC PANEL
Anion gap: 16 — ABNORMAL HIGH (ref 5–15)
BUN: 33 mg/dL — ABNORMAL HIGH (ref 8–23)
CO2: 19 mmol/L — ABNORMAL LOW (ref 22–32)
Calcium: 8.7 mg/dL — ABNORMAL LOW (ref 8.9–10.3)
Chloride: 102 mmol/L (ref 98–111)
Creatinine, Ser: 1.9 mg/dL — ABNORMAL HIGH (ref 0.44–1.00)
GFR calc Af Amer: 31 mL/min — ABNORMAL LOW (ref >60)
GFR calc non Af Amer: 27 mL/min — ABNORMAL LOW (ref >60)
Glucose, Bld: 102 mg/dL — ABNORMAL HIGH (ref 70–99)
Potassium: 3 mmol/L — ABNORMAL LOW (ref 3.5–5.1)
Sodium: 137 mmol/L (ref 135–145)

## 2019-06-24 LAB — CBC
HCT: 30.9 % — ABNORMAL LOW (ref 36.0–46.0)
Hemoglobin: 10.1 g/dL — ABNORMAL LOW (ref 12.0–15.0)
MCH: 26.3 pg (ref 26.0–34.0)
MCHC: 32.7 g/dL (ref 30.0–36.0)
MCV: 80.5 fL (ref 80.0–100.0)
Platelets: 106 10*3/uL — ABNORMAL LOW (ref 150–400)
RBC: 3.84 MIL/uL — ABNORMAL LOW (ref 3.87–5.11)
RDW: 25.5 % — ABNORMAL HIGH (ref 11.5–15.5)
WBC: 13.2 10*3/uL — ABNORMAL HIGH (ref 4.0–10.5)
nRBC: 0.3 % — ABNORMAL HIGH (ref 0.0–0.2)

## 2019-06-24 LAB — GLUCOSE, CAPILLARY
Glucose-Capillary: 126 mg/dL — ABNORMAL HIGH (ref 70–99)
Glucose-Capillary: 111 mg/dL — ABNORMAL HIGH (ref 70–99)
Glucose-Capillary: 118 mg/dL — ABNORMAL HIGH (ref 70–99)

## 2019-06-24 LAB — COOXEMETRY PANEL
Carboxyhemoglobin: 1.3 % (ref 0.5–1.5)
Methemoglobin: 0.5 % (ref 0.0–1.5)
O2 Saturation: 44.4 %
Total hemoglobin: 9.8 g/dL — ABNORMAL LOW (ref 12.0–16.0)

## 2019-06-24 MED ORDER — ALPRAZOLAM 0.25 MG PO TABS
0.2500 mg | ORAL_TABLET | Freq: Three times a day (TID) | ORAL | Status: DC | PRN
  Administered 2019-06-25 (×3): 0.25 mg via ORAL
  Filled 2019-06-24 (×3): qty 1

## 2019-06-24 MED ORDER — FUROSEMIDE 80 MG PO TABS
80.0000 mg | ORAL_TABLET | Freq: Every day | ORAL | Status: DC
  Administered 2019-06-25: 09:00:00 80 mg via ORAL
  Filled 2019-06-24: qty 1

## 2019-06-24 MED ORDER — OXYCODONE HCL 5 MG PO TABS
5.0000 mg | ORAL_TABLET | ORAL | Status: DC | PRN
  Administered 2019-06-24 – 2019-06-25 (×3): 5 mg via ORAL
  Filled 2019-06-24 (×3): qty 1

## 2019-06-24 NOTE — Plan of Care (Signed)

## 2019-06-24 NOTE — Progress Notes (Signed)
Family Medicine Teaching Service Daily Progress Note Intern Pager: [phone]  Patient name: Leah [name] [facility] Medical record number: [id number] Date of birth: 1950/08/18 Age: 69 y.o. Gender: female  Primary Care Provider: Guadalupe Dawn, MD Consultants: Heart Failure Code Status: DNR  Pt Overview and Major Events to Date:  06/22/19- admitted  Assessment and Plan: Leah [name] is a 69 y.o. female presenting with SOB, concern milrinone PICC torn. PMH is significant for viral cardiomyopathy, CAD, HTN, HLD, PVD, HFrEF (15%), CKD, MV replacement, s/p resection for bronchogenic lung cancer, tobacco use  Severe Biventricular Heart failure  CAD Today volume status is euvolemic. No LLE. UOP 3.9L  Plan to follow up on HF recommendations and palliative recommendations as they come in today. Cr 1.09 -Continuous cardiac monitoring -Follow-up cardiology recommendations             - Milrinone per HF             - IV lasix 80mg  BID             -No BB due to cardiogenic shock history -Consult palliative medicine, appreciate recommendations: home with hospice, start xanax and oxycodone PRN -Up with assistance -Regular diet -Heparin for DVT ppx  -Continue BiDil, amiodarone  AKI on CKDStage IIIB Creatinine 2.04 on admission, on most recent discharge was 1.09. Creatinine improving. Patient with end stage heart failure symptoms, received lasix 80mg  IV x2 yesterday. Will watch for HF recommendations today. -Reassess for lasix dosing daily -Avoid other nephrotoxic medications  Prediabetes Patient without history of DM. CBG 111. A1c from February 2021 6.2%.   PAD, severe Patient s/p left fem-pop and L CEA, bilateral RAS s/p stenting. Home medications are clopidogrel, asa. -continue home medications  Electrolyte imbalances Patient has history of hyponatremia, hypokalemia. Na 137, K 3.0 Will reassess daily. Home medication includes klor con 29mEq daily. - Klor con 9mEq BID  today  COPD Continue home medications budesonide nebulizers, albuterol PRN  Chronic Neuropathic pain Continue home meds gabapentin 200 mg qhs  FEN/GI: regular PPx: heparin  Disposition: Home with hospice pending TOC home needs  Subjective:  Did not like her breakfast today otherwise no concerns.   Objective: Temp:  [97.4 F (36.3 C)-98.2 F (36.8 C)] 98.2 F (36.8 C) (03/31 1916) Pulse Rate:  [105-115] 113 (04/01 0600) Resp:  [16-21] 16 (04/01 0700) BP: (98-148)/(55-114) 141/76 (04/01 0700) SpO2:  [92 %-100 %] 95 % (04/01 0600) Weight:  [63.9 kg] 63.9 kg (04/01 0222)  Physical Exam:  General: Appears unwell, no acute distress, and older than state age. Cardiac: tachycardic, regular rhythm, normal heart sounds, no murmurs Respiratory: CTAB, normal effort Extremities: No edema or cyanosis.  Laboratory: Recent Labs  Lab 06/22/19 1739 06/23/19 0456 06/24/19 0529  WBC 12.9* 13.1* 13.2*  HGB 10.7* 9.6* 10.1*  HCT 33.5* 30.0* 30.9*  PLT 129* 116* 106*   Recent Labs  Lab 06/22/19 0828 06/22/19 1739 06/23/19 0456 06/23/19 1428 06/24/19 0529  NA 138  --  132* 137 137  K 4.0  --  3.1* 3.6 3.0*  CL 107  --  102 104 102  CO2 18*  --  18* 21* 19*  BUN 34*  --  32* 33* 33*  CREATININE 2.04*   < > 1.88* 2.01* 1.90*  CALCIUM 9.3  --  8.4* 8.9 8.7*  PROT 5.9*  --   --   --   --   BILITOT 1.7*  --   --   --   --  ALKPHOS 118  --   --   --   --   ALT 40  --   --   --   --   AST 27  --   --   --   --   GLUCOSE 96  --  233* 129* 102*   < > = values in this interval not displayed.   Imaging/Diagnostic Tests:  PORTABLE CHEST 1 VIEW COMPARISON:  05/29/2019.  06/05/2017. IMPRESSION: 1.  Central line noted with tip over SVC. 2.  Prior cardiac valve repair.  Cardiomegaly. 3. Postsurgical changes right lung. Chronic interstitial changes. No acute infiltrate noted.   Gerlene Fee, DO  06/24/2019, 8:00 AM PGY-1, [place] Intern pager:  [phone], text pages welcome

## 2019-06-24 NOTE — TOC Initial Note (Signed)
Transition of Care [facility]) - Initial/Assessment Note    Patient Details  Name: Leah [name] MRN: [id number] Date of Birth: 08/25/1950  Transition of [facility]) CM/SW Contact:    Zenon Mayo, RN Phone Number: 06/24/2019, 1:32 PM  Clinical Narrative:                 NCM received referral from [facility] with Palliative, stating family wants [facility], she will be on milrinone.  NCM spoke with son to confirm.  He  States yes he wants [facility].  NCM made referral to Dorian Pod with [facility], she took referral, patient will be ready for dc tomorrow via ptar.  NCM also contacted Carolynn Sayers for the milrinone drip.  Patient will need hospital bed, bsc, walker and oxygten (2liters), Dorian Pod is working on getting this DME today so patient can be ready for dc tomorrow.   Expected Discharge Plan: Home w Hospice Care Barriers to Discharge: Continued Medical Work up   Patient Goals and CMS Choice Patient states their goals for this hospitalization and ongoing recovery are:: home with hospice CMS Medicare.gov Compare Post Acute Care list provided to:: Patient Represenative (must comment)([name]) Choice offered to / list presented to : Adult Children  Expected Discharge Plan and Services Expected Discharge Plan: Home w Hospice Care   Discharge Planning Services: CM Consult Post Acute Care Choice: Hospice Living arrangements for the past 2 months: Single Family Home                 DME Arranged: ([facility] will arrange DME)         HH Arranged: RN [place] Agency: ([place]) Date [place]: 06/24/19 Time [place]: [place] Representative spoke with at [place]: Dorian Pod  Prior Living Arrangements/Services Living arrangements for the past 2 months: [place]   Patient language and need for interpreter reviewed:: Yes Do you feel safe going back to the place where you live?: Yes      Need for Family Participation in Patient Care:  Yes (Comment) Care giver support system in place?: Yes (comment)   Criminal Activity/Legal Involvement Pertinent to Current Situation/Hospitalization: No - Comment as needed  Activities of Daily Living      Permission Sought/Granted                  Emotional Assessment       Orientation: : Oriented to Self, Oriented to Place, Oriented to  Time, Oriented to Situation Alcohol / Substance Use: Not Applicable Psych Involvement: No (comment)  Admission diagnosis:  CHF (congestive heart failure) (HCC) [I50.9] Biventricular heart failure ([place]) [I50.82] Patient Active Problem List   Diagnosis Date Noted  . Anxiety state   . CHF (congestive heart failure) ([place]) 06/23/2019  . CKD (chronic kidney disease), stage III 06/22/2019  . Palliative care patient 06/22/2019  . Pressure injury of skin 05/31/2019  . Acute renal failure with acute tubular necrosis superimposed on stage 3a chronic kidney disease ([place])   . Acute on chronic combined systolic and diastolic heart failure ([place])   . Goals of care, counseling/discussion   . NSTEMI (non-ST elevated myocardial infarction) ([place])   . Palliative care by specialist   . Protein-calorie malnutrition, severe 05/19/2019  . AMS (altered mental status) 05/18/2019  . Cardiogenic shock ([place]) 05/18/2019  . AKI (acute kidney injury) ([place])   . Dyspnea 04/15/2019  . Thoracic aortic aneurysm without rupture ([place]) 09/03/2018  . Viral upper respiratory illness 03/16/2018  .  Hypokalemia 02/12/2018  . Claudication ([place]) 11/21/2017  . Seasonal allergies 07/28/2017  . Bilateral sciatica 07/02/2017  . Bilateral hearing loss due to cerumen impaction 07/02/2017  . Biventricular heart failure ([place]) 06/05/2017  . Bronchogenic lung cancer, right ([place]) 11/29/2016  . Dysphagia 08/26/2016  . Health care maintenance 04/17/2016  . Dizziness 04/17/2016  . Decreased visual acuity 01/17/2016  . Depressed mood 07/27/2015  . S/P MVR (mitral valve replacement)  12/05/2014  . Moderate aortic regurgitation 10/26/2014  . Renal vascular disease 10/26/2014  . Acute on chronic diastolic congestive heart failure ([place]) 10/21/2014  . Tobacco use 10/21/2014  . Diastolic dysfunction, grade 2 by echo June 2016 10/21/2014  . Carpal tunnel syndrome 10/17/2014  . Severe mitral regurgitation 08/25/2014  . Unstable angina ([place]) 08/22/2014  . Low back pain 07/05/2012  . PVC (premature ventricular contraction) 10/02/2011  . History of angioedema with ACE 2013 06/14/2011  . PVD- s/p multiple proceedures 06/04/2011  . Hyperlipidemia 09/28/2008  . Essential hypertension, benign 09/28/2008  . CAD S/P LAD DES 2009 with 70% ISR 08/24/14 09/28/2008   PCP:  Guadalupe Dawn, MD Pharmacy:   [facility] [place] [address] Phone: [phone] Fax: [phone]  [place], [place] - [address] [place] [address] Phone: [phone] Fax: [phone]  [place], [place] - [address] [place] [address] Phone: [phone] Fax: [phone]     Social Determinants of Health (SDOH) Interventions    Readmission Risk Interventions Readmission Risk Prevention Plan 06/24/2019 05/21/2019  Transportation Screening Complete Complete  PCP or Specialist Appt within 3-5 Days Complete Complete  HRI or Home Care Consult Complete Complete  Social Work Consult for [place] Planning/Counseling Complete Complete  Palliative Care Screening Not Applicable Not Applicable  Medication Review Press photographer) Complete Complete  Some recent data might be hidden

## 2019-06-24 NOTE — Progress Notes (Signed)
Daily Progress Note   Patient Name: Leah [name]       Date: 06/24/2019 DOB: 04-24-1950  Age: 69 y.o. MRN#: [id number] Attending Physician: Lind Covert, MD Primary Care Physician: Guadalupe Dawn, MD Admit Date: 06/22/2019  Reason for Consultation/Follow-up: Establishing goals of care  Subjective: Patient awake, alert, oriented. Able to participate in discussion. Sitting up in chair. C/o of intermittent dyspnea at rest.   GOC:  Met with patient and multiple family members at bedside including [name]), three brothers, and daughters on [name].  Introduced role of palliative medicine. Patient/son remember previous conversations with palliative NP, Elmo Putt.  Discussed events from previous hospitalization, events leading up to this hospitalization, and diagnoses, interventions, plan of care. Frankly and compassionately explained that she is end-stage heart failure and she continues to decline despite aggressive medical management. Discussed poor prognosis and consensus from internal medicine and heart failure team that comfort approach and hospice services are recommended.   Discussed comfort focused care plan and hospice philosophy and options. Patient is tearful. She understands she is dying and shares that if she is dying, she wishes to go home and spend time with her grandchildren and family. She shares that this is in The Lord's hands. Patient and family understand diagnoses and poor prognosis. Family is very supportive and wish to take her home with support of hospice services.   Patient preferably would like to continue milrinone gtt for now. She does understand this may give her a little more time, but can also pass on milrinone. Also, that this can be  discontinued at any time at home. Family is hopeful that [facility] hospice will accept her with milrinone gtt.   Confirmed patient's decision for DNR code status. She shares that she does not wish to be 'beat up on.' Compassionately agreed that with underlying end-stage heart failure, recommendation is for DNR and to allow a natural death.   Discussed symptom management medications. Discussed DME requests.  Answered questions and concerns about care plan. PMT contact information given to family.    Length of Stay: 1  Current Medications: Scheduled Meds:  . amiodarone  200 mg Oral BID  . aspirin EC  81 mg Oral Daily  . budesonide (PULMICORT) nebulizer solution  0.25 mg Nebulization BID  . clopidogrel  75 mg Oral Daily  .  feeding supplement (ENSURE ENLIVE)  237 mL Oral TID BM  . furosemide  80 mg Intravenous BID  . gabapentin  200 mg Oral QHS  . Gerhardt's butt cream  1 application Topical Daily  . heparin  5,000 Units Subcutaneous Q8H  . isosorbide-hydrALAZINE  1 tablet Oral TID  . nicotine  14 mg Transdermal Daily  . potassium chloride  40 mEq Oral BID  . senna  1 tablet Oral Daily    Continuous Infusions: . milrinone 0.25 mcg/kg/min (06/24/19 0919)    PRN Meds: albuterol, polyethylene glycol  Physical Exam Vitals and nursing note reviewed.  Constitutional:      General: She is awake.     Appearance: She is ill-appearing.  HENT:     Head: Normocephalic and atraumatic.  Cardiovascular:     Rate and Rhythm: Normal rate.     Heart sounds: Murmur present.  Pulmonary:     Effort: No tachypnea, accessory muscle usage or respiratory distress.     Breath sounds: Normal breath sounds.     Comments: Dyspnea at rest Abdominal:     Tenderness: There is no abdominal tenderness.  Neurological:     Mental Status: She is alert and oriented to person, place, and time.  Psychiatric:        Mood and Affect: Mood normal.        Speech: Speech normal.        Behavior: Behavior  normal.        Cognition and Memory: Cognition normal.             Vital Signs: BP (!) 142/66 (BP Location: Right Arm)   Pulse (!) 110   Temp 97.7 F (36.5 C) (Oral)   Resp 20   Ht 6' 1.5" (1.867 m)   Wt 63.9 kg   SpO2 97%   BMI 18.32 kg/m  SpO2: SpO2: 97 % O2 Device: O2 Device: Room Air O2 Flow Rate: O2 Flow Rate (L/min): 2 L/min  Intake/output summary:   Intake/Output Summary (Last 24 hours) at 06/24/2019 1105 Last data filed at 06/24/2019 6962 Gross per 24 hour  Intake 1373.51 ml  Output 1650 ml  Net -276.49 ml   LBM: Last BM Date: 06/23/19 Baseline Weight: Weight: 63.4 kg(scale c) Most recent weight: Weight: 63.9 kg       Palliative Assessment/Data: PPS 40%      Patient Active Problem List   Diagnosis Date Noted  . CHF (congestive heart failure) ([place]) 06/23/2019  . CKD (chronic kidney disease), stage III 06/22/2019  . Palliative care patient 06/22/2019  . Pressure injury of skin 05/31/2019  . Acute renal failure with acute tubular necrosis superimposed on stage 3a chronic kidney disease ([place])   . Acute on chronic combined systolic and diastolic heart failure ([place])   . Goals of care, counseling/discussion   . NSTEMI (non-ST elevated myocardial infarction) ([place])   . Palliative care by specialist   . Protein-calorie malnutrition, severe 05/19/2019  . AMS (altered mental status) 05/18/2019  . Cardiogenic shock ([place]) 05/18/2019  . AKI (acute kidney injury) ([place])   . Shortness of breath 04/15/2019  . Thoracic aortic aneurysm without rupture ([place]) 09/03/2018  . Viral upper respiratory illness 03/16/2018  . Hypokalemia 02/12/2018  . Claudication ([place]) 11/21/2017  . Seasonal allergies 07/28/2017  . Bilateral sciatica 07/02/2017  . Bilateral hearing loss due to cerumen impaction 07/02/2017  . Biventricular heart failure ([place]) 06/05/2017  . Bronchogenic lung cancer, right ([place]) 11/29/2016  . Dysphagia 08/26/2016  .  Health care maintenance 04/17/2016  . Dizziness  04/17/2016  . Decreased visual acuity 01/17/2016  . Depressed mood 07/27/2015  . S/P MVR (mitral valve replacement) 12/05/2014  . Moderate aortic regurgitation 10/26/2014  . Renal vascular disease 10/26/2014  . Acute on chronic diastolic congestive heart failure ([place]) 10/21/2014  . Tobacco use 10/21/2014  . Diastolic dysfunction, grade 2 by echo June 2016 10/21/2014  . Carpal tunnel syndrome 10/17/2014  . Severe mitral regurgitation 08/25/2014  . Unstable angina ([place]) 08/22/2014  . Low back pain 07/05/2012  . PVC (premature ventricular contraction) 10/02/2011  . History of angioedema with ACE 2013 06/14/2011  . PVD- s/p multiple proceedures 06/04/2011  . Hyperlipidemia 09/28/2008  . Essential hypertension, benign 09/28/2008  . CAD S/P LAD DES 2009 with 70% ISR 08/24/14 09/28/2008    Palliative Care Assessment & Plan   Patient Profile: 69 y.o. female  with past medical history of end-stage a/c biventricular HF EF 15% on home milrinone, viral cardiomyopathy, CAD s/p CABG, severe PAD s/p left fem-pop , HTN, HLD, MV replacement, s/p resection for bronchogenic lung cancer, tobacco use admitted on 06/22/2019 with shortness of breath and issues with milrinone infusion/PICC line from SNF rehab. Heart failure team following. Patient receiving milrinone infusion and lasix IV BID. Recommending hospice services, as patient is end-stage heart failure. Palliative medicine consultation for goals of care/hospice discussion.   Assessment: End stage A/C biventricular heart failure EF 15% CAD s/p CABG CKD stage IIIb Severe PAD s/p left fem-pop Lung cancer s/p resection Chronic neuropathic pain  Recommendations/Plan:  GOC discussion with patient and multiple family members.  Patient and family understand diagnoses and poor prognosis. Patient wishes to discharge home with hospice services and spend her remainder of time with family. Family agreeable and very supportive.  Patient confirms DNR code  status. Durable DNR placed in chart.   TOC team notified. Home with hospice. Pt wishes to continue milrinone gtt. [facility] hospice agency may accept.  Symptom management  Oxycodone 3m PO q4h prn pain/dyspnea/air hunger/tachypnea  Xanax 0.229mPO TID prn anxiety/sleep   Continue lasix per HF team.  Continue milrinone gtt  Continue current plan of care inpatient. Hopefully home with hospice in the next 24-48 hours.    Code Status: DNR   Code Status Orders  (From admission, onward)         Start     Ordered   06/22/19 1549  Do not attempt resuscitation (DNR)  Continuous    Question Answer Comment  In the event of cardiac or respiratory ARREST Do not call a "code blue"   In the event of cardiac or respiratory ARREST Do not perform Intubation, CPR, defibrillation or ACLS   In the event of cardiac or respiratory ARREST Use medication by any route, position, wound care, and other measures to relive pain and suffering. May use oxygen, suction and manual treatment of airway obstruction as needed for comfort.      06/22/19 1548        Code Status History    Date Active Date Inactive Code Status Order ID Comments User Context   05/25/2019 1636 06/04/2019 0036 DNR [id number] ProudNP Inpatient   05/18/2019 2101 05/25/2019 1636 Full Code [id number] Moshernpatient   05/18/2019 1852 05/18/2019 2101 Full Code [id number] AlphaDO ED   09/16/2018 1044 09/16/2018 1542 Full Code [id number] HeckMD Inpatient   02/04/2018 1401 02/04/2018 2006 Full Code [id number] Abbott  Gwenyth Allegra, MD Inpatient   06/05/2017 2007 06/07/2017 1731 Full Code [id number]  Tommie Raymond, NP ED   11/29/2016 1707 12/03/2016 1558 Full Code [id number]  John Giovanni, PA-C Inpatient   12/05/2014 1426 12/08/2014 0922 Full Code [id number]  Sara Chu Inpatient   11/28/2014 1337 11/30/2014 1921 Full Code [id number]  Archie Patten, MD Inpatient   10/26/2014 1522  10/28/2014 1941 Full Code [id number]  [place], Peter M, MD Inpatient   10/21/2014 1501 10/26/2014 1522 Full Code [id number]  Erlene Quan, PA-C ED   08/24/2014 1414 08/25/2014 2102 Full Code [id number]  Wellington Hampshire, MD Inpatient   08/22/2014 1509 08/24/2014 1414 Full Code [id number]  Dorothy Spark, MD ED   Advance Care Planning Activity    Advance Directive Documentation     Most Recent Value  Type of Advance Directive  Out of facility DNR (pink MOST or yellow form), Living will  Pre-existing out of facility DNR order (yellow form or pink MOST form)  --  "MOST" Form in Place?  --       Prognosis:   Poor prognosis with end-stage biventricular heart failure  Discharge Planning:  Home with Hospice  Care plan was discussed with patient, multiple family members (children, brothers), RN, Updated FMTS, Dr Haroldine Laws, Amy Clegg  Thank you for allowing the Palliative Medicine Team to assist in the care of this patient.   Time In: 1100- Time Out: 1215 Total Time 75 Prolonged Time Billed yes      Greater than 50%  of this time was spent counseling and coordinating care related to the above assessment and plan.  Ihor Dow, DNP, FNP-C Palliative Medicine Team  Phone: [phone] Fax: [phone]  Please contact Palliative Medicine Team phone at [phone] for questions and concerns.

## 2019-06-24 NOTE — Telephone Encounter (Signed)
Happy to see her. Please squeeze her in and let me know the date and time so I can review chart JG

## 2019-06-24 NOTE — Plan of Care (Signed)
  Problem: Clinical Measurements: Goal: Respiratory complications will improve Outcome: Progressing   Problem: Activity: Goal: Risk for activity intolerance will decrease Outcome: Progressing   Problem: Nutrition: Goal: Adequate nutrition will be maintained Outcome: Progressing

## 2019-06-24 NOTE — Progress Notes (Addendum)
Advanced Heart Failure Rounding Note  PCP-Cardiologist: Peter Martinique, MD   Subjective:   Remains on milrinone 0.25 mcg. CO-OX 44%.    Feeling better today. Denies SOB. Wants to go home with her family.   Objective:   Weight Range: 63.9 kg Body mass index is 18.32 kg/m.   Vital Signs:   Temp:  [97.7 F (36.5 C)-98.8 F (37.1 C)] 97.7 F (36.5 C) (04/01 0939) Pulse Rate:  [105-118] 110 (04/01 0939) Resp:  [16-21] 20 (04/01 0939) BP: (98-148)/(62-114) 142/66 (04/01 0939) SpO2:  [92 %-98 %] 97 % (04/01 0939) Weight:  [63.9 kg] 63.9 kg (04/01 0222) Last BM Date: 06/23/19  Weight change: Filed Weights   06/22/19 1959 06/23/19 0417 06/24/19 0222  Weight: 63.4 kg 63 kg 63.9 kg    Intake/Output:   Intake/Output Summary (Last 24 hours) at 06/24/2019 1208 Last data filed at 06/24/2019 0624 Gross per 24 hour  Intake 1373.51 ml  Output 1650 ml  Net -276.49 ml      Physical Exam   CVP 3  General:  Sitting the chair. No resp difficulty HEENT: normal Neck: supple.JVP flat.  Carotids 2+ bilat; no bruits. No lymphadenopathy or thryomegaly appreciated. Cor: PMI nondisplaced. Tachy, Regular rate & rhythm. No rubs, or murmurs. +S3  Lungs: clear Abdomen: soft, nontender, nondistended. No hepatosplenomegaly. No bruits or masses. Good bowel sounds. Extremities: no cyanosis, clubbing, rash, R and LLE trace edema Neuro: alert & orientedx3, cranial nerves grossly intact. moves all 4 extremities w/o difficulty. Affect pleasant   Telemetry  ST 100s   EKG   n/a  Labs    CBC Recent Labs    06/22/19 0828 06/22/19 1739 06/23/19 0456 06/24/19 0529  WBC 11.7*   < > 13.1* 13.2*  NEUTROABS 10.2*  --   --   --   HGB 10.4*   < > 9.6* 10.1*  HCT 33.3*   < > 30.0* 30.9*  MCV 83.9   < > 82.6 80.5  PLT 135*   < > 116* 106*   < > = values in this interval not displayed.   Basic Metabolic Panel Recent Labs    06/23/19 0456 06/23/19 0456 06/23/19 1428 06/24/19 0529  NA 132*    < > 137 137  K 3.1*   < > 3.6 3.0*  CL 102   < > 104 102  CO2 18*   < > 21* 19*  GLUCOSE 233*   < > 129* 102*  BUN 32*   < > 33* 33*  CREATININE 1.88*   < > 2.01* 1.90*  CALCIUM 8.4*   < > 8.9 8.7*  MG 1.9  --   --   --    < > = values in this interval not displayed.   Liver Function Tests Recent Labs    06/22/19 0828  AST 27  ALT 40  ALKPHOS 118  BILITOT 1.7*  PROT 5.9*  ALBUMIN 3.0*   Recent Labs    06/22/19 0828  LIPASE 15   Cardiac Enzymes No results for input(s): CKTOTAL, CKMB, CKMBINDEX, TROPONINI in the last 72 hours.  BNP: BNP (last 3 results) Recent Labs    05/18/19 1707 05/21/19 1206 06/22/19 0828  BNP >4,500.0* 1,490.6* >4,500.0*    ProBNP (last 3 results) No results for input(s): PROBNP in the last 8760 hours.   D-Dimer No results for input(s): DDIMER in the last 72 hours. Hemoglobin A1C No results for input(s): HGBA1C in the last 72 hours. Fasting Lipid  Panel No results for input(s): CHOL, HDL, LDLCALC, TRIG, CHOLHDL, LDLDIRECT in the last 72 hours. Thyroid Function Tests No results for input(s): TSH, T4TOTAL, T3FREE, THYROIDAB in the last 72 hours.  Invalid input(s): FREET3  Other results:   Imaging    No results found.   Medications:     Scheduled Medications: . amiodarone  200 mg Oral BID  . aspirin EC  81 mg Oral Daily  . budesonide (PULMICORT) nebulizer solution  0.25 mg Nebulization BID  . clopidogrel  75 mg Oral Daily  . feeding supplement (ENSURE ENLIVE)  237 mL Oral TID BM  . furosemide  80 mg Intravenous BID  . gabapentin  200 mg Oral QHS  . Gerhardt's butt cream  1 application Topical Daily  . heparin  5,000 Units Subcutaneous Q8H  . isosorbide-hydrALAZINE  1 tablet Oral TID  . nicotine  14 mg Transdermal Daily  . potassium chloride  40 mEq Oral BID  . senna  1 tablet Oral Daily    Infusions: . milrinone 0.25 mcg/kg/min (06/24/19 0919)    PRN Medications: albuterol, ALPRAZolam, oxyCODONE, polyethylene  glycol     Assessment/Plan   1. End Stage A/C Biventricular HF  ECHO 04/2019 Biventricular HF EF 15%.  On home milrinone 0.25 mcg.  -CO-OX persistently low with shock physiology on milrinone 0.25 mcg.  - Volume status much improved. Stop IV lasix. Tomorrow start lasix 80 mg daily.  - Renal function stable.   - No bb with shock  -Watch renal function closely.   2.  CAD -s/p CABG with LIMA to LAD and MVR (bioprosthetic -No chest pain  -HS Trop 132>140   3. Moderate to Severe AI   4. Lung Cancer S/P Resection  5. PAD, severe s/p left fem-pop and L CEA - bilateral RAS s/p stenting  6.  CKD Stage IIIb  -Creatinine 1.9  -Follow renal function.  7. PVCs   8.NSVT K and Mag low Supp K and give 2 grams Mag   Palliative Care appreciated. Plans to d/c home with Hospice on milrinone for now.  - Milrinone no longer working with persistent shock physiology.   - I met with multiple family members at bedside. Answered all question regarding transition to Hospice. We will set up virtual HF f/u.   DME being delivered. [facility] will pick up for hOpsice. I have ordered home milrinone.   Length of Stay: [place], NP  06/24/2019, 12:08 PM  Advanced Heart Failure Team Pager [phone] (M-F; 7a - 4p)  Please contact [facility] for night-coverage after hours (4p -7a ) and weekends on [email or web address]  Patient seen and examined with the above-signed Advanced Practice Provider and/or Housestaff. I personally reviewed laboratory data, imaging studies and relevant notes. I independently examined the patient and formulated the important aspects of the plan. I have edited the note to reflect any of my changes or salient points. I have personally discussed the plan with the patient and/or family.  Remains weak but less dyspneic. Co-ox 44% despite milrinone support. No orthopnea or PND.   Family meeting today with Hospice team and have decided on home hospice with palliative milrinone .    General:  Elderly. Frail Weak appearing. No resp difficulty HEENT: normal Neck: supple. JVP 6-7. Carotids 2+ bilat; no bruits. No lymphadenopathy or thryomegaly appreciated. Cor: PMI nondisplaced. Regular rate & rhythm. +s3 Lungs: clear Abdomen: soft, nontender, nondistended. No hepatosplenomegaly. No bruits or masses. Good bowel sounds. Extremities: no cyanosis, clubbing, rash, trace  Edema. Cool  Neuro: alert & orientedx3, cranial nerves grossly intact. moves all 4 extremities w/o difficulty. Affect pleasant  She has end-stage HF with severe biventricular failure. 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 Remains in shock despite milrinone. Only real option is hospice. Appreciate Palliative care's help.   Of note, I see there is a note reaching out to Dr. Irven Shelling office and I suspect family has reached out for a second opinion as outpatient.   Glori Bickers, MD  4:22 PM

## 2019-06-24 NOTE — Progress Notes (Signed)
Patient has been on RA since the begining of my shift, O2 sats 97-100%.

## 2019-06-24 NOTE — Progress Notes (Signed)
Pharmacist Heart Failure Core Measure Documentation  Assessment: Leah [name] has an EF documented as <15% on 05/18/19 by ECHO.  Rationale: Heart failure patients with left ventricular systolic dysfunction (LVSD) and an EF < 40% should be prescribed an angiotensin converting enzyme inhibitor (ACEI) or angiotensin receptor blocker (ARB) at discharge unless a contraindication is documented in the medical record.  This patient is not currently on an ACEI or ARB for HF.  This note is being placed in the record in order to provide documentation that a contraindication to the use of these agents is present for this encounter.  ACE Inhibitor or Angiotensin Receptor Blocker is contraindicated (specify all that apply)  []   ACEI allergy AND ARB allergy []   Angioedema []   Moderate or severe aortic stenosis []   Hyperkalemia []   Hypotension []   Renal artery stenosis [x]   Worsening renal function, preexisting renal disease or dysfunction   Iran Rowe 06/24/2019 11:06 AM

## 2019-06-24 NOTE — Progress Notes (Signed)
   Vital Signs MEWS/VS Documentation       06/24/2019 1700 06/24/2019 1943 06/24/2019 2018 06/24/2019 2031   MEWS Score:  2  2  2   --   MEWS Score Color:  Yellow  Yellow  Yellow  --   Resp:  14  --  20  --   Pulse:  (!) 114  --  (!) 115  --   BP:  133/68  --  (!) 142/68  --   Temp:  --  --  98.2 F (36.8 C)  --   O2 Device:  --  --  --  Room [facility] 06/24/2019,9:03 PM

## 2019-06-25 LAB — BASIC METABOLIC PANEL
Anion gap: 13 (ref 5–15)
BUN: 30 mg/dL — ABNORMAL HIGH (ref 8–23)
CO2: 22 mmol/L (ref 22–32)
Calcium: 8.6 mg/dL — ABNORMAL LOW (ref 8.9–10.3)
Chloride: 101 mmol/L (ref 98–111)
Creatinine, Ser: 1.81 mg/dL — ABNORMAL HIGH (ref 0.44–1.00)
GFR calc Af Amer: 33 mL/min — ABNORMAL LOW (ref >60)
GFR calc non Af Amer: 29 mL/min — ABNORMAL LOW (ref >60)
Glucose, Bld: 128 mg/dL — ABNORMAL HIGH (ref 70–99)
Potassium: 3.1 mmol/L — ABNORMAL LOW (ref 3.5–5.1)
Sodium: 136 mmol/L (ref 135–145)

## 2019-06-25 LAB — COOXEMETRY PANEL
Carboxyhemoglobin: 1.8 % — ABNORMAL HIGH (ref 0.5–1.5)
Methemoglobin: 1.1 % (ref 0.0–1.5)
O2 Saturation: 50.9 %
Total hemoglobin: 10.9 g/dL — ABNORMAL LOW (ref 12.0–16.0)

## 2019-06-25 MED ORDER — ALPRAZOLAM 0.25 MG PO TABS: 0.2500 mg | ORAL_TABLET | Freq: Three times a day (TID) | ORAL | 0 refills | Status: DC | PRN

## 2019-06-25 MED ORDER — OXYCODONE HCL 5 MG PO TABS: 5.0000 mg | ORAL_TABLET | ORAL | 0 refills | Status: DC | PRN

## 2019-06-25 MED ORDER — AMIODARONE HCL 200 MG PO TABS: 200.0000 mg | ORAL_TABLET | Freq: Every day | ORAL | Status: DC

## 2019-06-25 MED ORDER — OXYCODONE HCL 5 MG PO TABS: 5.0000 mg | ORAL_TABLET | ORAL | 0 refills | Status: AC | PRN

## 2019-06-25 MED ORDER — BIDIL 20-37.5 MG PO TABS: 1.0000 | ORAL_TABLET | Freq: Three times a day (TID) | ORAL | 0 refills | Status: AC

## 2019-06-25 MED ORDER — POTASSIUM CHLORIDE CRYS ER 20 MEQ PO TBCR: 40.0000 meq | EXTENDED_RELEASE_TABLET | Freq: Two times a day (BID) | ORAL | 0 refills | Status: AC

## 2019-06-25 MED ORDER — FUROSEMIDE 80 MG PO TABS: 80.0000 mg | ORAL_TABLET | Freq: Every day | ORAL | 0 refills | Status: AC

## 2019-06-25 MED ORDER — POTASSIUM CHLORIDE CRYS ER 20 MEQ PO TBCR
40.0000 meq | EXTENDED_RELEASE_TABLET | Freq: Once | ORAL | Status: AC
  Administered 2019-06-25: 16:00:00 40 meq via ORAL
  Filled 2019-06-25: qty 2

## 2019-06-25 MED ORDER — AMIODARONE HCL 200 MG PO TABS: 200.0000 mg | ORAL_TABLET | Freq: Every day | ORAL | 0 refills | Status: AC

## 2019-06-25 MED ORDER — ALPRAZOLAM 0.25 MG PO TABS: 0.2500 mg | ORAL_TABLET | Freq: Three times a day (TID) | ORAL | 0 refills | Status: AC | PRN

## 2019-06-25 MED FILL — FUROSEMIDE 80 MG TAB: 80 | 30 days supply | Qty: 30 | Fill #0

## 2019-06-25 MED FILL — AMIODARONE HCL 200 MG TABS: 200 | 30 days supply | Qty: 30 | Fill #0

## 2019-06-25 MED FILL — BIDIL 20-37.5 MG TABS: 20-37.5 | 30 days supply | Qty: 90 | Fill #0

## 2019-06-25 MED FILL — ALPRAZolam 0.25 MG TABS: 0.25 | 10 days supply | Qty: 30 | Fill #0

## 2019-06-25 MED FILL — POTASSIUM CHLORIDE 20meqER: 20 | 30 days supply | Qty: 120 | Fill #0

## 2019-06-25 MED FILL — OXYCODONE HCL 5 MG TABS: 5 | 5 days supply | Qty: 30 | Fill #0

## 2019-06-25 NOTE — Progress Notes (Signed)
RN went to check on patient, patient says she was having an anxiety attack and requested to wear her O2. RN placed o@ on 2L for comfort. O2 sats 99% on RA. RN stayed in room to make sure patient was ok, pt states she is fine now.  Son Called to check on pt because she hasn't answered her phone. Updated son, found phone and gave to pt. She returned phone calls.

## 2019-06-25 NOTE — Progress Notes (Signed)
Daily Progress Note   Patient Name: Leah [name]       Date: 06/25/2019 DOB: 1950-10-17  Age: 69 y.o. MRN#: [id number] Attending Physician: Lind Covert, MD Primary Care Physician: Guadalupe Dawn, MD Admit Date: 06/22/2019  Reason for Consultation/Follow-up: Establishing goals of care  Subjective/GOC: Patient awake, alert, oriented. Alesi had a panic attack last night. She felt closed in and short of breath. She reports relief from prn xanax and oxygen. She also feels relief from dyspnea this AM following oxycodone dose.   Discussed plan for home with hospice. Patient is hopeful to discharge home today if everything is arranged with family and hospice liaison. Reassured patient that I would discussed with son.   Shortly after, spoke with [name]. Equipment was delivered this AM. He has been coordinating with Dorian Pod, hospice liaison. Family is ready for discharge home this afternoon but [name] is requesting to pick up medication scripts prior to her discharge. Reassured [name] that I would discuss with attending.   Answered all questions. [name] has PMT contact information and understands he can call with questions or concerns.   Discussed with attending and RN.   Length of Stay: 2  Current Medications: Scheduled Meds:  . amiodarone  200 mg Oral BID  . aspirin EC  81 mg Oral Daily  . budesonide (PULMICORT) nebulizer solution  0.25 mg Nebulization BID  . clopidogrel  75 mg Oral Daily  . feeding supplement (ENSURE ENLIVE)  237 mL Oral TID BM  . furosemide  80 mg Oral Daily  . gabapentin  200 mg Oral QHS  . Gerhardt's butt cream  1 application Topical Daily  . heparin  5,000 Units Subcutaneous Q8H  . isosorbide-hydrALAZINE  1 tablet Oral TID  . nicotine  14 mg  Transdermal Daily  . potassium chloride  40 mEq Oral BID  . potassium chloride  40 mEq Oral Once  . senna  1 tablet Oral Daily    Continuous Infusions: . milrinone 0.25 mcg/kg/min (06/25/19 0512)    PRN Meds: albuterol, ALPRAZolam, oxyCODONE, polyethylene glycol  Physical Exam Vitals and nursing note reviewed.  Constitutional:      General: She is awake.     Appearance: She is ill-appearing.  HENT:     Head: Normocephalic and atraumatic.  Cardiovascular:     Rate and  Rhythm: Normal rate.     Heart sounds: Murmur present.  Pulmonary:     Effort: No tachypnea, accessory muscle usage or respiratory distress.     Breath sounds: Normal breath sounds.     Comments: Dyspnea at rest Abdominal:     Tenderness: There is no abdominal tenderness.  Neurological:     Mental Status: She is alert and oriented to person, place, and time.  Psychiatric:        Mood and Affect: Mood normal.        Speech: Speech normal.        Behavior: Behavior normal.        Cognition and Memory: Cognition normal.             Vital Signs: BP (!) 141/75   Pulse (!) 114   Temp 98.1 F (36.7 C) (Oral)   Resp 19   Ht 6' 1.5" (1.867 m)   Wt 64.2 kg   SpO2 95%   BMI 18.42 kg/m  SpO2: SpO2: 95 % O2 Device: O2 Device: Room Air O2 Flow Rate: O2 Flow Rate (L/min): 2 L/min  Intake/output summary:   Intake/Output Summary (Last 24 hours) at 06/25/2019 1057 Last data filed at 06/25/2019 0957 Gross per 24 hour  Intake 1554.11 ml  Output 700 ml  Net 854.11 ml   LBM: Last BM Date: 06/24/19 Baseline Weight: Weight: 63.4 kg(scale c) Most recent weight: Weight: 64.2 kg       Palliative Assessment/Data: PPS 40%      Patient Active Problem List   Diagnosis Date Noted  . Anxiety state   . CHF (congestive heart failure) ([place]) 06/23/2019  . CKD (chronic kidney disease), stage III 06/22/2019  . Palliative care patient 06/22/2019  . Pressure injury of skin 05/31/2019  . Acute renal failure with acute  tubular necrosis superimposed on stage 3a chronic kidney disease ([place])   . Acute on chronic combined systolic and diastolic heart failure ([place])   . Goals of care, counseling/discussion   . NSTEMI (non-ST elevated myocardial infarction) ([place])   . Palliative care by specialist   . Protein-calorie malnutrition, severe 05/19/2019  . AMS (altered mental status) 05/18/2019  . Cardiogenic shock ([place]) 05/18/2019  . AKI (acute kidney injury) ([place])   . Dyspnea 04/15/2019  . Thoracic aortic aneurysm without rupture ([place]) 09/03/2018  . Viral upper respiratory illness 03/16/2018  . Hypokalemia 02/12/2018  . Claudication ([place]) 11/21/2017  . Seasonal allergies 07/28/2017  . Bilateral sciatica 07/02/2017  . Bilateral hearing loss due to cerumen impaction 07/02/2017  . Biventricular heart failure ([place]) 06/05/2017  . Bronchogenic lung cancer, right ([place]) 11/29/2016  . Dysphagia 08/26/2016  . Health care maintenance 04/17/2016  . Dizziness 04/17/2016  . Decreased visual acuity 01/17/2016  . Depressed mood 07/27/2015  . S/P MVR (mitral valve replacement) 12/05/2014  . Moderate aortic regurgitation 10/26/2014  . Renal vascular disease 10/26/2014  . Acute on chronic diastolic congestive heart failure ([place]) 10/21/2014  . Tobacco use 10/21/2014  . Diastolic dysfunction, grade 2 by echo June 2016 10/21/2014  . Carpal tunnel syndrome 10/17/2014  . Severe mitral regurgitation 08/25/2014  . Unstable angina ([place]) 08/22/2014  . Low back pain 07/05/2012  . PVC (premature ventricular contraction) 10/02/2011  . History of angioedema with ACE 2013 06/14/2011  . PVD- s/p multiple proceedures 06/04/2011  . Hyperlipidemia 09/28/2008  . Essential hypertension, benign 09/28/2008  . CAD S/P LAD DES 2009 with 70% ISR 08/24/14 09/28/2008    Palliative Care Assessment &  Plan   Patient Profile: 68 y.o. female  with past medical history of end-stage a/c biventricular HF EF 15% on home milrinone, viral cardiomyopathy,  CAD s/p CABG, severe PAD s/p left fem-pop , HTN, HLD, MV replacement, s/p resection for bronchogenic lung cancer, tobacco use admitted on 06/22/2019 with shortness of breath and issues with milrinone infusion/PICC line from SNF rehab. Heart failure team following. Patient receiving milrinone infusion and lasix IV BID. Recommending hospice services, as patient is end-stage heart failure. Palliative medicine consultation for goals of care/hospice discussion.   Assessment: End stage A/C biventricular heart failure EF 15% CAD s/p CABG CKD stage IIIb Severe PAD s/p left fem-pop Lung cancer s/p resection Chronic neuropathic pain  Recommendations/Plan:  GOC discussion with patient and multiple family members on 06/24/19.  Patient and family understand diagnoses and poor prognosis. Patient wishes to discharge home with hospice services and spend her remainder of time with family. Family agreeable and very supportive.  Patient confirms DNR code status. Durable DNR placed in chart.   TOC team notified. Home with [facility] hospice. Pt wishes to continue milrinone gtt.  Symptom management  Oxycodone 5mg  PO q4h prn pain/dyspnea/air hunger/tachypnea  Xanax 0.25mg  PO TID prn anxiety/sleep   Continue lasix per HF team.  Continue milrinone gtt   Son requesting medication scripts prior to patient's discharge home. Family is ready for discharge home today if approved by attending. Updated attending.    Code Status: DNR   Code Status Orders  (From admission, onward)         Start     Ordered   06/22/19 1549  Do not attempt resuscitation (DNR)  Continuous    Question Answer Comment  In the event of cardiac or respiratory ARREST Do not call a "code blue"   In the event of cardiac or respiratory ARREST Do not perform Intubation, CPR, defibrillation or ACLS   In the event of cardiac or respiratory ARREST Use medication by any route, position, wound care, and other measures to relive pain and  suffering. May use oxygen, suction and manual treatment of airway obstruction as needed for comfort.      06/22/19 1548        Code Status History    Date Active Date Inactive Code Status Order ID Comments User Context   05/25/2019 1636 06/04/2019 0036 DNR [id number]  Pershing Proud, NP Inpatient   05/18/2019 2101 05/25/2019 1636 Full Code [id number]  Barrett, Felisa Bonier Inpatient   05/18/2019 1852 05/18/2019 2101 Full Code [id number]  Nuala Alpha, DO ED   09/16/2018 1044 09/16/2018 1542 Full Code [id number]  Marty Heck, MD Inpatient   02/04/2018 1401 02/04/2018 2006 Full Code [id number]  Marty Heck, MD Inpatient   06/05/2017 2007 06/07/2017 1731 Full Code [id number]  Tommie Raymond, NP ED   11/29/2016 1707 12/03/2016 1558 Full Code [id number]  John Giovanni, PA-C Inpatient   12/05/2014 1426 12/08/2014 0922 Full Code [id number]  Coolidge Breeze, PA-C Inpatient   11/28/2014 1337 11/30/2014 1921 Full Code [id number]  Archie Patten, MD Inpatient   10/26/2014 1522 10/28/2014 1941 Full Code [id number]  Martinique, Peter M, MD Inpatient   10/21/2014 1501 10/26/2014 1522 Full Code [id number]  Erlene Quan, PA-C ED   08/24/2014 1414 08/25/2014 2102 Full Code [id number]  Wellington Hampshire, MD Inpatient   08/22/2014 1509 08/24/2014 1414 Full Code [id number]  Dorothy Spark, MD ED   Advance Care Planning Activity    Advance Directive Documentation  Most Recent Value  Type of Advance Directive  Out of facility DNR (pink MOST or yellow form), Living will  Pre-existing out of facility DNR order (yellow form or pink MOST form)  --  "MOST" Form in Place?  --       Prognosis:   Poor prognosis with end-stage biventricular heart failure  Discharge Planning:  Home with Hospice  Care plan was discussed with patient, RN, attending, [name])  Thank you for allowing the Palliative Medicine Team to assist in the care of this patient.   Time In: 1030- Time Out: 1100 Total Time 30 Prolonged Time  Billed no      Greater than 50%  of this time was spent counseling and coordinating care related to the above assessment and plan.  Ihor Dow, DNP, FNP-C Palliative Medicine Team  Phone: [phone] Fax: [phone]  Please contact Palliative Medicine Team phone at [phone] for questions and concerns.

## 2019-06-25 NOTE — Plan of Care (Signed)

## 2019-06-25 NOTE — TOC Transition Note (Addendum)
Transition of Care [facility]) - CM/SW Discharge Note   Patient Details  Name: Leah [name] MRN: [id number] Date of Birth: 06-22-1950  [facility]) CM/SW Contact:  Trula Ore, [place] Phone Number: 06/25/2019, 2:27 PM   Clinical Narrative:     Patient will DC to: Home with Home Hospice through [facility]   Anticipated DC date: 06/25/2019  Family notified: Leah [name]   Transport by: Leah [name]  ?  Per MD patient ready for DC to Home with Home Hospice .[facility] and [facility] have agreed to take patient, All DME has been delivered to the home, and teaching for home milrinone has been completed.Both agency's in agreement for patient to discharge home today. RN, patient, patient's family notified of DC. DC packet on chart. Ambulance transport requested for patient.  CSW signing off.  Final next level of care: Home w Hospice Care Barriers to Discharge: Continued Medical Work up   Patient Goals and CMS Choice Patient states their goals for this hospitalization and ongoing recovery are:: to go home with hospice CMS Medicare.gov Compare Post Acute Care list provided to:: Patient Represenative (must comment)(Leah [name]) Choice offered to / list presented to : Adult Children  Discharge Placement                Patient to be transferred to facility by: [place] Name of family member notified: Leah [name] Patient and family notified of of transfer: 06/25/19  Discharge Plan and Services   Discharge Planning Services: CM Consult Post Acute Care Choice: Hospice          DME Arranged: Lajean Manes will arrange DME)         HH Arranged: RN [place] Agency: [place] Date [place]: 06/24/19 Time [place]: [place] Representative spoke with at [place]: [place] ([place]) Interventions     Readmission Risk Interventions Readmission Risk Prevention Plan 06/24/2019 05/21/2019  Transportation Screening Complete Complete  PCP or  Specialist Appt within 3-5 Days Complete Complete  HRI or [place] Complete Complete  Social Work Consult for [place] Planning/Counseling Complete Complete  Palliative Care Screening Not Applicable Not Applicable  Medication Review Press photographer) Complete Complete  Some recent data might be hidden

## 2019-06-25 NOTE — Progress Notes (Signed)
Physical Therapy Treatment Patient Details Name: Leah [name] MRN: [id number] DOB: [birth year] Today's Date: 06/25/2019    History of Present Illness Pt is a 69 yo female admitted with worsening HR symptoms after concern for PICC line for milrinone torn and off ~4 hours, recent admission in Feb for cardiogencic shock with pts EF reaching 15%, PMH includes viral cardiomyopathy, CAD (s/p LIMA to LAD in 11/2014), HTN, HLD< PVD, HFrEF 15%, CKD, MV replacement, s/p resection for bronchogenic lung cancer    PT Comments    Pt admitted with above diagnosis. Pt was able to pivot to recliner with mod assist to power up and min assist to pivot with RW. Refused to walk due to fatigue. Exercises performed as well.   Pt currently with functional limitations due to balance and endurance deficits. Pt will benefit from skilled PT to increase their independence and safety with mobility to allow discharge to the venue listed below.     Follow Up Recommendations  SNF     Equipment Recommendations  Rolling walker with 5" wheels    Recommendations for Other Services       Precautions / Restrictions Precautions Precautions: Fall Precaution Comments: watch HR and 02 Restrictions Weight Bearing Restrictions: No    Mobility  Bed Mobility Overal bed mobility: Needs Assistance Bed Mobility: Supine to Sit Rolling: Min guard Sidelying to sit: Min assist;HOB elevated Supine to sit: HOB elevated;Min assist     General bed mobility comments: MinA to assist with BLEs onto bed and scooting towards HOB  Transfers Overall transfer level: Needs assistance Equipment used: Rolling walker (2 wheeled) Transfers: Sit to/from Stand Sit to Stand: Min assist;Mod assist Stand pivot transfers: Min assist       General transfer comment:  modA for sit to stand even with bed raised. With several attempts.  Min assist for transfer to chair with RW but pt stated she could not walk today.   Ambulation/Gait                  Stairs             Wheelchair Mobility    Modified Rankin (Stroke Patients Only)       Balance Overall balance assessment: Needs assistance Sitting-balance support: Feet supported;No upper extremity supported Sitting balance-[name]: Good Sitting balance - Comments: tends to lean forward due to fatigue   Standing balance support: Bilateral upper extremity supported;During functional activity Standing balance-[name]: Fair Standing balance comment: reliant on RW for ambulation                            Cognition Arousal/Alertness: Awake/alert Behavior During Therapy: WFL for tasks assessed/performed Overall Cognitive Status: No family/caregiver present to determine baseline cognitive functioning Area of Impairment: Awareness;Memory;Safety/judgement                 Orientation Level: Disoriented to;Situation Current Attention Level: Sustained Memory: Decreased short-term memory   Safety/Judgement: Decreased awareness of deficits Awareness: Intellectual Problem Solving: Slow processing;Requires verbal cues General Comments: Pt following commands and requiring assist to mobilize pt as she was unaware of her new weakness.      Exercises Total Joint Exercises Ankle Circles/Pumps: AROM;Both;10 reps Long Arc Quad: AROM;Both;10 reps Marching in Standing: AROM;Both;10 reps;Seated;Standing General Exercises - Lower Extremity Heel Slides: AROM;Both;5 reps    General Comments General comments (skin integrity, edema, etc.): O2 on 3LNC >90%      Pertinent Vitals/Pain Pain Assessment:  No/denies pain    Home Living                      Prior Function            PT Goals (current goals can now be found in the care plan section) Progress towards PT goals: Progressing toward goals    Frequency    Min 3X/week      PT Plan Current plan remains appropriate    Co-evaluation              AM-PAC PT "6  Clicks" Mobility   Outcome Measure  Help needed turning from your back to your side while in a flat bed without using bedrails?: A Little Help needed moving from lying on your back to sitting on the side of a flat bed without using bedrails?: A Little Help needed moving to and from a bed to a chair (including a wheelchair)?: A Little Help needed standing up from a chair using your arms (e.g., wheelchair or bedside chair)?: A Little Help needed to walk in hospital room?: A Little Help needed climbing 3-5 steps with a railing? : A Lot 6 Click Score: 17    End of Session Equipment Utilized During Treatment: Gait belt;Oxygen Activity Tolerance: Patient limited by fatigue Patient left: in chair;with call bell/phone within reach;with chair alarm set Nurse Communication: Mobility status PT Visit Diagnosis: Unsteadiness on feet (R26.81);Muscle weakness (generalized) (M62.81)     Time: 8832-5498 PT Time Calculation (min) (ACUTE ONLY): 18 min  Charges:  $Therapeutic Activity: 8-22 mins                     Jaimee Corum W,PT Acute Rehabilitation Services Pager:  [phone]  Office:  [place] 06/25/2019, 1:58 PM

## 2019-06-25 NOTE — TOC Progression Note (Signed)
Transition of Care [facility]) - Progression Note    Patient Details  Name: JANIE CAPP MRN: [id number] Date of Birth: 13-Apr-1950  Transition of Care [facility]) CM/SW [place], [place] Phone Number: 06/25/2019, 2:09 PM  Clinical Narrative:     CSW spoke with patients [name] to verify address. Let patients son know that patient will be discharging soon. Waiting on discharge/discharge orders  for patient.      Expected Discharge Plan: Home w Hospice Care Barriers to Discharge: Continued Medical Work up  Expected Discharge Plan and Services Expected Discharge Plan: [place]   Discharge Planning Services: CM Consult Post Acute Care Choice: Hospice Living arrangements for the past 2 months: Single Family Home Expected Discharge Date: 06/25/19               DME Arranged: Lajean Manes will arrange DME)         HH Arranged: RN [place] Agency: ([place]) Date [place]: 06/24/19 Time [place]: [place] Representative spoke with at [place]: [place] ([place]) Interventions    Readmission Risk Interventions Readmission Risk Prevention Plan 06/24/2019 05/21/2019  Transportation Screening Complete Complete  PCP or Specialist Appt within 3-5 Days Complete Complete  HRI or [place] Complete Complete  Social Work Consult for [place] Planning/Counseling Complete Complete  Palliative Care Screening Not Applicable Not Applicable  Medication Review Press photographer) Complete Complete  Some recent data might be hidden

## 2019-06-25 NOTE — Progress Notes (Signed)
Discharge teaching given to [place] patients son who was missing some medications in patients home supply. Md Notified and will call Transition pharmacy refills on Bidil and amiodarone.

## 2019-06-25 NOTE — Progress Notes (Addendum)
Advanced Heart Failure Rounding Note  PCP-Cardiologist: Peter Martinique, MD   Subjective:   Remains on milrinone 0.25 mcg. CO-OX 51%.   Being discharged to home hospice today. OOB sitting in chair. No complaints. Eating ice cream.   Objective:   Weight Range: 64.2 kg Body mass index is 18.42 kg/m.   Vital Signs:   Temp:  [97.7 F (36.5 C)-99.3 F (37.4 C)] 97.7 F (36.5 C) (04/02 1100) Pulse Rate:  [114-119] 114 (04/02 0423) Resp:  [14-22] 19 (04/02 0423) BP: (131-142)/(68-75) 141/75 (04/02 0400) SpO2:  [94 %-100 %] 95 % (04/02 0841) Weight:  [64.2 kg] 64.2 kg (04/02 0522) Last BM Date: 06/24/19  Weight change: Filed Weights   06/23/19 0417 06/24/19 0222 06/25/19 0522  Weight: 63 kg 63.9 kg 64.2 kg    Intake/Output:   Intake/Output Summary (Last 24 hours) at 06/25/2019 1451 Last data filed at 06/25/2019 0957 Gross per 24 hour  Intake 1554.11 ml  Output 700 ml  Net 854.11 ml      Physical Exam   PHYSICAL EXAM: General:  Chronically ill/ cachetic appearing AAF. No respiratory difficulty HEENT: normal Neck: supple. no JVD. Carotids 2+ bilat; no bruits. No lymphadenopathy or thyromegaly appreciated. Cor: PMI nondisplaced. Regular rhythm, tachy rate. No rubs, gallops or murmurs. Lungs: clear Abdomen: soft, nontender, nondistended. No hepatosplenomegaly. No bruits or masses. Good bowel sounds. Extremities: no cyanosis, clubbing, rash, 1+ bilateral LEE edema, LEs cool to touch  Neuro: alert & oriented x 3, cranial nerves grossly intact. moves all 4 extremities w/o difficulty. Affect pleasant.    Telemetry  ST 110s   EKG   n/a  Labs    CBC Recent Labs    06/23/19 0456 06/24/19 0529  WBC 13.1* 13.2*  HGB 9.6* 10.1*  HCT 30.0* 30.9*  MCV 82.6 80.5  PLT 116* 562*   Basic Metabolic Panel Recent Labs    06/23/19 0456 06/23/19 1428 06/24/19 0529 06/25/19 0407  NA 132*   < > 137 136  K 3.1*   < > 3.0* 3.1*  CL 102   < > 102 101  CO2 18*   < > 19*  22  GLUCOSE 233*   < > 102* 128*  BUN 32*   < > 33* 30*  CREATININE 1.88*   < > 1.90* 1.81*  CALCIUM 8.4*   < > 8.7* 8.6*  MG 1.9  --   --   --    < > = values in this interval not displayed.   Liver Function Tests No results for input(s): AST, ALT, ALKPHOS, BILITOT, PROT, ALBUMIN in the last 72 hours. No results for input(s): LIPASE, AMYLASE in the last 72 hours. Cardiac Enzymes No results for input(s): CKTOTAL, CKMB, CKMBINDEX, TROPONINI in the last 72 hours.  BNP: BNP (last 3 results) Recent Labs    05/18/19 1707 05/21/19 1206 06/22/19 0828  BNP >4,500.0* 1,490.6* >4,500.0*    ProBNP (last 3 results) No results for input(s): PROBNP in the last 8760 hours.   D-Dimer No results for input(s): DDIMER in the last 72 hours. Hemoglobin A1C No results for input(s): HGBA1C in the last 72 hours. Fasting Lipid Panel No results for input(s): CHOL, HDL, LDLCALC, TRIG, CHOLHDL, LDLDIRECT in the last 72 hours. Thyroid Function Tests No results for input(s): TSH, T4TOTAL, T3FREE, THYROIDAB in the last 72 hours.  Invalid input(s): FREET3  Other results:   Imaging    No results found.   Medications:     Scheduled Medications: Marland Kitchen [  START ON 07/15/2019] amiodarone  200 mg Oral Daily  . aspirin EC  81 mg Oral Daily  . budesonide (PULMICORT) nebulizer solution  0.25 mg Nebulization BID  . clopidogrel  75 mg Oral Daily  . feeding supplement (ENSURE ENLIVE)  237 mL Oral TID BM  . furosemide  80 mg Oral Daily  . gabapentin  200 mg Oral QHS  . Gerhardt's butt cream  1 application Topical Daily  . heparin  5,000 Units Subcutaneous Q8H  . isosorbide-hydrALAZINE  1 tablet Oral TID  . nicotine  14 mg Transdermal Daily  . potassium chloride  40 mEq Oral BID  . potassium chloride  40 mEq Oral Once  . senna  1 tablet Oral Daily    Infusions: . milrinone 0.25 mcg/kg/min (06/25/19 0512)    PRN Medications: albuterol, ALPRAZolam, oxyCODONE, polyethylene  glycol     Assessment/Plan   1. End Stage A/C Biventricular HF  - ECHO 04/2019 Biventricular HF EF 15%.  On home milrinone 0.25 mcg.  -CO-OX persistently low with shock physiology on milrinone 0.25 mcg.  - Volume status much improved. Continue PO lasix 80 mg daily.  - Renal function stable.   - No bb with shock   2.  CAD -s/p CABG with LIMA to LAD and MVR (bioprosthetic -No chest pain  -HS Trop 132>140   3. Moderate to Severe AI   4. Lung Cancer S/P Resection  5. PAD, severe s/p left fem-pop and L CEA - bilateral RAS s/p stenting  6.  CKD Stage IIIb  -Creatinine improved, 2.0>>1.9>>1.8  7. PVCs  - controlled on PO amiodarone   8. Hypokalemia - K 3.1 - supp KCl ordered  Palliative Care appreciated. Plans to d/c home with Hospice on milrinone. We will set up virtual HF f/u. DME being delivered. [facility] will pick up for Hospice. Home milrinone.   Cardiac Meds for Discharge Amiodarone 200 mg once daily ASA 81 mg Plavix 75 mg Lasix 80 mg daily Bidil 1 tablet tid KCl 40 mg bid   Length of Stay: 2  Lyda Jester, PA-C  06/25/2019, 2:51 PM  Advanced Heart Failure Team Pager [phone] (M-F; 7a - 4p)  Please contact  Cardiology for night-coverage after hours (4p -7a ) and weekends on [email or web address]   Patient seen and examined with the above-signed Advanced Practice Provider and/or Housestaff. I personally reviewed laboratory data, imaging studies and relevant notes. I independently examined the patient and formulated the important aspects of the plan. I have edited the note to reflect any of my changes or salient points. I have personally discussed the plan with the patient and/or family.  No change overnight. She is going home with hospice today and palliative milrinone. Co-ox still low at 50%. K supped prior to d/c.   We have reviewed meds with HF pharmd.   Weak appearing No JVD Cor RRR + s3 Lungs clear Ab soft Ext cool trace edema   I am happy to  see again as outpatient.   Glori Bickers, MD  5:00 PM

## 2019-06-26 ENCOUNTER — Inpatient Hospital Stay: Payer: Self-pay

## 2019-06-26 ENCOUNTER — Emergency Department (HOSPITAL_COMMUNITY): Payer: Medicare Other

## 2019-06-26 ENCOUNTER — Inpatient Hospital Stay (HOSPITAL_COMMUNITY)
Admission: EM | Admit: 2019-06-26 | Discharge: 2019-07-24 | DRG: 951 | Disposition: E | Payer: Medicare Other | Attending: Family Medicine | Admitting: Family Medicine

## 2019-06-26 DIAGNOSIS — J440 Chronic obstructive pulmonary disease with acute lower respiratory infection: Secondary | ICD-10-CM | POA: Diagnosis present

## 2019-06-26 DIAGNOSIS — K219 Gastro-esophageal reflux disease without esophagitis: Secondary | ICD-10-CM | POA: Diagnosis present

## 2019-06-26 DIAGNOSIS — I130 Hypertensive heart and chronic kidney disease with heart failure and stage 1 through stage 4 chronic kidney disease, or unspecified chronic kidney disease: Secondary | ICD-10-CM | POA: Diagnosis present

## 2019-06-26 DIAGNOSIS — F411 Generalized anxiety disorder: Secondary | ICD-10-CM | POA: Diagnosis present

## 2019-06-26 DIAGNOSIS — Z7902 Long term (current) use of antithrombotics/antiplatelets: Secondary | ICD-10-CM

## 2019-06-26 DIAGNOSIS — Z9981 Dependence on supplemental oxygen: Secondary | ICD-10-CM | POA: Diagnosis not present

## 2019-06-26 DIAGNOSIS — I252 Old myocardial infarction: Secondary | ICD-10-CM

## 2019-06-26 DIAGNOSIS — Z823 Family history of stroke: Secondary | ICD-10-CM

## 2019-06-26 DIAGNOSIS — I509 Heart failure, unspecified: Secondary | ICD-10-CM | POA: Diagnosis not present

## 2019-06-26 DIAGNOSIS — J9621 Acute and chronic respiratory failure with hypoxia: Secondary | ICD-10-CM | POA: Diagnosis present

## 2019-06-26 DIAGNOSIS — J189 Pneumonia, unspecified organism: Secondary | ICD-10-CM | POA: Diagnosis present

## 2019-06-26 DIAGNOSIS — R0602 Shortness of breath: Secondary | ICD-10-CM | POA: Diagnosis not present

## 2019-06-26 DIAGNOSIS — E785 Hyperlipidemia, unspecified: Secondary | ICD-10-CM | POA: Diagnosis present

## 2019-06-26 DIAGNOSIS — H9193 Unspecified hearing loss, bilateral: Secondary | ICD-10-CM | POA: Diagnosis present

## 2019-06-26 DIAGNOSIS — I493 Ventricular premature depolarization: Secondary | ICD-10-CM | POA: Diagnosis present

## 2019-06-26 DIAGNOSIS — Z85118 Personal history of other malignant neoplasm of bronchus and lung: Secondary | ICD-10-CM

## 2019-06-26 DIAGNOSIS — I712 Thoracic aortic aneurysm, without rupture: Secondary | ICD-10-CM | POA: Diagnosis present

## 2019-06-26 DIAGNOSIS — I739 Peripheral vascular disease, unspecified: Secondary | ICD-10-CM | POA: Diagnosis present

## 2019-06-26 DIAGNOSIS — R64 Cachexia: Secondary | ICD-10-CM | POA: Diagnosis present

## 2019-06-26 DIAGNOSIS — F17210 Nicotine dependence, cigarettes, uncomplicated: Secondary | ICD-10-CM | POA: Diagnosis present

## 2019-06-26 DIAGNOSIS — N179 Acute kidney failure, unspecified: Secondary | ICD-10-CM | POA: Diagnosis present

## 2019-06-26 DIAGNOSIS — R69 Illness, unspecified: Secondary | ICD-10-CM

## 2019-06-26 DIAGNOSIS — R579 Shock, unspecified: Secondary | ICD-10-CM | POA: Diagnosis present

## 2019-06-26 DIAGNOSIS — I2510 Atherosclerotic heart disease of native coronary artery without angina pectoris: Secondary | ICD-10-CM | POA: Diagnosis present

## 2019-06-26 DIAGNOSIS — J9620 Acute and chronic respiratory failure, unspecified whether with hypoxia or hypercapnia: Secondary | ICD-10-CM

## 2019-06-26 DIAGNOSIS — Z841 Family history of disorders of kidney and ureter: Secondary | ICD-10-CM

## 2019-06-26 DIAGNOSIS — Z515 Encounter for palliative care: Secondary | ICD-10-CM | POA: Diagnosis present

## 2019-06-26 DIAGNOSIS — Z20822 Contact with and (suspected) exposure to covid-19: Secondary | ICD-10-CM | POA: Diagnosis present

## 2019-06-26 DIAGNOSIS — I5042 Chronic combined systolic (congestive) and diastolic (congestive) heart failure: Secondary | ICD-10-CM | POA: Diagnosis present

## 2019-06-26 DIAGNOSIS — R58 Hemorrhage, not elsewhere classified: Secondary | ICD-10-CM | POA: Diagnosis not present

## 2019-06-26 DIAGNOSIS — I110 Hypertensive heart disease with heart failure: Secondary | ICD-10-CM | POA: Diagnosis not present

## 2019-06-26 DIAGNOSIS — I491 Atrial premature depolarization: Secondary | ICD-10-CM | POA: Diagnosis not present

## 2019-06-26 DIAGNOSIS — N1832 Chronic kidney disease, stage 3b: Secondary | ICD-10-CM | POA: Diagnosis present

## 2019-06-26 DIAGNOSIS — Z8249 Family history of ischemic heart disease and other diseases of the circulatory system: Secondary | ICD-10-CM

## 2019-06-26 DIAGNOSIS — R0902 Hypoxemia: Secondary | ICD-10-CM | POA: Diagnosis not present

## 2019-06-26 DIAGNOSIS — I5084 End stage heart failure: Secondary | ICD-10-CM | POA: Diagnosis present

## 2019-06-26 DIAGNOSIS — Z681 Body mass index (BMI) 19 or less, adult: Secondary | ICD-10-CM | POA: Diagnosis not present

## 2019-06-26 DIAGNOSIS — Z66 Do not resuscitate: Secondary | ICD-10-CM | POA: Diagnosis present

## 2019-06-26 DIAGNOSIS — R509 Fever, unspecified: Secondary | ICD-10-CM

## 2019-06-26 DIAGNOSIS — Z952 Presence of prosthetic heart valve: Secondary | ICD-10-CM

## 2019-06-26 DIAGNOSIS — I5082 Biventricular heart failure: Secondary | ICD-10-CM | POA: Diagnosis present

## 2019-06-26 DIAGNOSIS — R404 Transient alteration of awareness: Secondary | ICD-10-CM | POA: Diagnosis not present

## 2019-06-26 DIAGNOSIS — Z880 Allergy status to penicillin: Secondary | ICD-10-CM

## 2019-06-26 DIAGNOSIS — Z8042 Family history of malignant neoplasm of prostate: Secondary | ICD-10-CM

## 2019-06-26 DIAGNOSIS — Z888 Allergy status to other drugs, medicaments and biological substances status: Secondary | ICD-10-CM

## 2019-06-26 DIAGNOSIS — Z951 Presence of aortocoronary bypass graft: Secondary | ICD-10-CM | POA: Diagnosis not present

## 2019-06-26 DIAGNOSIS — Z7982 Long term (current) use of aspirin: Secondary | ICD-10-CM

## 2019-06-26 DIAGNOSIS — B3324 Viral cardiomyopathy: Secondary | ICD-10-CM | POA: Diagnosis present

## 2019-06-26 DIAGNOSIS — I499 Cardiac arrhythmia, unspecified: Secondary | ICD-10-CM | POA: Diagnosis not present

## 2019-06-26 LAB — PROTIME-INR
INR: 1.6 — ABNORMAL HIGH (ref 0.8–1.2)
Prothrombin Time: 19.2 seconds — ABNORMAL HIGH (ref 11.4–15.2)

## 2019-06-26 LAB — CBG MONITORING, ED
Glucose-Capillary: 92 mg/dL (ref 70–99)
Glucose-Capillary: 175 mg/dL — ABNORMAL HIGH (ref 70–99)
Glucose-Capillary: 58 mg/dL — ABNORMAL LOW (ref 70–99)
Glucose-Capillary: 145 mg/dL — ABNORMAL HIGH (ref 70–99)

## 2019-06-26 LAB — RESPIRATORY PANEL BY RT PCR (FLU A&B, COVID)
Influenza A by PCR: NEGATIVE
Influenza B by PCR: NEGATIVE
SARS Coronavirus 2 by RT PCR: NEGATIVE

## 2019-06-26 LAB — CBC WITH DIFFERENTIAL/PLATELET
Abs Immature Granulocytes: 0.39 10*3/uL — ABNORMAL HIGH (ref 0.00–0.07)
Basophils Absolute: 0.1 10*3/uL (ref 0.0–0.1)
Basophils Relative: 0 %
Eosinophils Absolute: 0 10*3/uL (ref 0.0–0.5)
Eosinophils Relative: 0 %
HCT: 37.2 % (ref 36.0–46.0)
Hemoglobin: 11.6 g/dL — ABNORMAL LOW (ref 12.0–15.0)
Immature Granulocytes: 2 %
Lymphocytes Relative: 6 %
Lymphs Abs: 1 10*3/uL (ref 0.7–4.0)
MCH: 26.2 pg (ref 26.0–34.0)
MCHC: 31.2 g/dL (ref 30.0–36.0)
MCV: 84.2 fL (ref 80.0–100.0)
Monocytes Absolute: 1.1 10*3/uL — ABNORMAL HIGH (ref 0.1–1.0)
Monocytes Relative: 7 %
Neutro Abs: 14 10*3/uL — ABNORMAL HIGH (ref 1.7–7.7)
Neutrophils Relative %: 85 %
Platelets: 131 10*3/uL — ABNORMAL LOW (ref 150–400)
RBC: 4.42 MIL/uL (ref 3.87–5.11)
RDW: 26.5 % — ABNORMAL HIGH (ref 11.5–15.5)
WBC: 16.6 10*3/uL — ABNORMAL HIGH (ref 4.0–10.5)
nRBC: 0.7 % — ABNORMAL HIGH (ref 0.0–0.2)

## 2019-06-26 LAB — BASIC METABOLIC PANEL
Anion gap: 25 — ABNORMAL HIGH (ref 5–15)
BUN: 44 mg/dL — ABNORMAL HIGH (ref 8–23)
CO2: 12 mmol/L — ABNORMAL LOW (ref 22–32)
Calcium: 9.6 mg/dL (ref 8.9–10.3)
Chloride: 101 mmol/L (ref 98–111)
Creatinine, Ser: 2.61 mg/dL — ABNORMAL HIGH (ref 0.44–1.00)
GFR calc Af Amer: 21 mL/min — ABNORMAL LOW (ref >60)
GFR calc non Af Amer: 18 mL/min — ABNORMAL LOW (ref >60)
Glucose, Bld: 65 mg/dL — ABNORMAL LOW (ref 70–99)
Potassium: 5.3 mmol/L — ABNORMAL HIGH (ref 3.5–5.1)
Sodium: 138 mmol/L (ref 135–145)

## 2019-06-26 LAB — TROPONIN I (HIGH SENSITIVITY)
Troponin I (High Sensitivity): 128 ng/L (ref <18)
Troponin I (High Sensitivity): 114 ng/L (ref <18)

## 2019-06-26 LAB — POCT I-STAT 7, (LYTES, BLD GAS, ICA,H+H)
Acid-base deficit: 12 mmol/L — ABNORMAL HIGH (ref 0.0–2.0)
Bicarbonate: 14.1 mmol/L — ABNORMAL LOW (ref 20.0–28.0)
Calcium, Ion: 1.19 mmol/L (ref 1.15–1.40)
HCT: 37 % (ref 36.0–46.0)
Hemoglobin: 12.6 g/dL (ref 12.0–15.0)
O2 Saturation: 100 %
Potassium: 4.7 mmol/L (ref 3.5–5.1)
Sodium: 135 mmol/L (ref 135–145)
TCO2: 15 mmol/L — ABNORMAL LOW (ref 22–32)
pCO2 arterial: 33.8 mmHg (ref 32.0–48.0)
pH, Arterial: 7.229 — ABNORMAL LOW (ref 7.350–7.450)
pO2, Arterial: 219 mmHg — ABNORMAL HIGH (ref 83.0–108.0)

## 2019-06-26 LAB — MRSA PCR SCREENING: MRSA by PCR: NEGATIVE

## 2019-06-26 LAB — LACTIC ACID, PLASMA
Lactic Acid, Venous: 10.3 mmol/L (ref 0.5–1.9)
Lactic Acid, Venous: >11 mmol/L (ref 0.5–1.9)

## 2019-06-26 LAB — BRAIN NATRIURETIC PEPTIDE: B Natriuretic Peptide: >4500 pg/mL — ABNORMAL HIGH (ref 0.0–100.0)

## 2019-06-26 LAB — MAGNESIUM: Magnesium: 2.2 mg/dL (ref 1.7–2.4)

## 2019-06-26 MED ORDER — POTASSIUM CHLORIDE CRYS ER 20 MEQ PO TBCR
40.0000 meq | EXTENDED_RELEASE_TABLET | Freq: Two times a day (BID) | ORAL | Status: DC
  Administered 2019-06-26: 40 meq via ORAL
  Filled 2019-06-26: qty 2

## 2019-06-26 MED ORDER — ADULT MULTIVITAMIN W/MINERALS CH: 1.0000 | ORAL_TABLET | Freq: Every day | ORAL | Status: DC

## 2019-06-26 MED ORDER — VANCOMYCIN HCL 1500 MG/300ML IV SOLN
1500.0000 mg | Freq: Once | INTRAVENOUS | Status: AC
  Administered 2019-06-26: 1500 mg via INTRAVENOUS
  Filled 2019-06-26 (×2): qty 300

## 2019-06-26 MED ORDER — ENOXAPARIN SODIUM 30 MG/0.3ML ~~LOC~~ SOLN
30.0000 mg | SUBCUTANEOUS | Status: DC
  Administered 2019-06-26: 30 mg via SUBCUTANEOUS
  Filled 2019-06-26: qty 0.3

## 2019-06-26 MED ORDER — DOCUSATE SODIUM 100 MG PO CAPS: 100.0000 mg | ORAL_CAPSULE | Freq: Every day | ORAL | Status: DC

## 2019-06-26 MED ORDER — VANCOMYCIN VARIABLE DOSE PER UNSTABLE RENAL FUNCTION (PHARMACIST DOSING): Status: DC

## 2019-06-26 MED ORDER — SODIUM CHLORIDE 0.9 % IV SOLN
2.0000 g | Freq: Once | INTRAVENOUS | Status: AC
  Administered 2019-06-26: 2 g via INTRAVENOUS
  Filled 2019-06-26: qty 2

## 2019-06-26 MED ORDER — ISOSORB DINITRATE-HYDRALAZINE 20-37.5 MG PO TABS
1.0000 | ORAL_TABLET | Freq: Three times a day (TID) | ORAL | Status: DC
  Administered 2019-06-26 (×2): 1 via ORAL
  Filled 2019-06-26 (×2): qty 1

## 2019-06-26 MED ORDER — MILRINONE LACTATE IN DEXTROSE 20-5 MG/100ML-% IV SOLN
0.2500 ug/kg/min | INTRAVENOUS | Status: DC
  Administered 2019-06-26: 0.25 ug/kg/min via INTRAVENOUS
  Filled 2019-06-26 (×2): qty 100

## 2019-06-26 MED ORDER — CLOPIDOGREL BISULFATE 75 MG PO TABS: 75.0000 mg | ORAL_TABLET | Freq: Every day | ORAL | Status: DC

## 2019-06-26 MED ORDER — FLUTICASONE PROPIONATE HFA 44 MCG/ACT IN AERO: 1.0000 | INHALATION_SPRAY | Freq: Every day | RESPIRATORY_TRACT | Status: DC

## 2019-06-26 MED ORDER — FUROSEMIDE 10 MG/ML IJ SOLN
40.0000 mg | Freq: Once | INTRAMUSCULAR | Status: AC
  Administered 2019-06-26: 40 mg via INTRAVENOUS
  Filled 2019-06-26: qty 4

## 2019-06-26 MED ORDER — ALBUTEROL SULFATE (2.5 MG/3ML) 0.083% IN NEBU: 2.5000 mg | INHALATION_SOLUTION | Freq: Four times a day (QID) | RESPIRATORY_TRACT | Status: DC | PRN

## 2019-06-26 MED ORDER — ALPRAZOLAM 0.25 MG PO TABS
0.2500 mg | ORAL_TABLET | Freq: Three times a day (TID) | ORAL | Status: DC | PRN
  Administered 2019-06-26: 0.25 mg via ORAL
  Filled 2019-06-26: qty 1

## 2019-06-26 MED ORDER — FUROSEMIDE 10 MG/ML IJ SOLN: 40.0000 mg | Freq: Two times a day (BID) | INTRAMUSCULAR | Status: DC

## 2019-06-26 MED ORDER — POLYETHYLENE GLYCOL 3350 17 G PO PACK: 17.0000 g | PACK | Freq: Every day | ORAL | Status: DC | PRN

## 2019-06-26 MED ORDER — SENNA 8.6 MG PO TABS: 1.0000 | ORAL_TABLET | Freq: Every day | ORAL | Status: DC

## 2019-06-26 MED ORDER — SODIUM CHLORIDE 0.9 % IV SOLN: 2.0000 g | Freq: Once | INTRAVENOUS | Status: DC

## 2019-06-26 MED ORDER — GABAPENTIN 100 MG PO CAPS
200.0000 mg | ORAL_CAPSULE | Freq: Every day | ORAL | Status: DC
  Administered 2019-06-26: 200 mg via ORAL
  Filled 2019-06-26: qty 2

## 2019-06-26 MED ORDER — AMIODARONE HCL 200 MG PO TABS: 200.0000 mg | ORAL_TABLET | Freq: Every day | ORAL | Status: DC

## 2019-06-26 MED ORDER — ENSURE ENLIVE PO LIQD
237.0000 mL | Freq: Three times a day (TID) | ORAL | Status: DC
  Administered 2019-06-26: 237 mL via ORAL

## 2019-06-26 MED ORDER — DEXTROSE 50 % IV SOLN
50.0000 mL | Freq: Once | INTRAVENOUS | Status: AC
  Administered 2019-06-26: 50 mL via INTRAVENOUS
  Filled 2019-06-26: qty 50

## 2019-06-26 MED ORDER — SODIUM CHLORIDE 0.9 % IV SOLN
2.0000 g | INTRAVENOUS | Status: DC
  Administered 2019-06-27: 2 g via INTRAVENOUS
  Filled 2019-06-26 (×2): qty 2

## 2019-06-26 MED ORDER — BUDESONIDE 0.25 MG/2ML IN SUSP
0.2500 mg | Freq: Two times a day (BID) | RESPIRATORY_TRACT | Status: DC
  Administered 2019-06-26 – 2019-06-27 (×2): 0.25 mg via RESPIRATORY_TRACT
  Filled 2019-06-26 (×2): qty 2

## 2019-06-26 MED ORDER — MILRINONE LACTATE IN DEXTROSE 20-5 MG/100ML-% IV SOLN: 0.1250 ug/kg/min | INTRAVENOUS | Status: DC

## 2019-06-26 MED ORDER — OXYCODONE HCL 5 MG PO TABS
5.0000 mg | ORAL_TABLET | ORAL | Status: DC | PRN
  Administered 2019-06-26: 5 mg via ORAL
  Filled 2019-06-26: qty 1

## 2019-06-26 MED ORDER — ALBUTEROL SULFATE (2.5 MG/3ML) 0.083% IN NEBU
2.5000 mg | INHALATION_SOLUTION | Freq: Four times a day (QID) | RESPIRATORY_TRACT | Status: DC
  Administered 2019-06-26: 2.5 mg via RESPIRATORY_TRACT
  Filled 2019-06-26: qty 3

## 2019-06-26 MED ORDER — ASPIRIN EC 81 MG PO TBEC: 81.0000 mg | DELAYED_RELEASE_TABLET | Freq: Every day | ORAL | Status: DC

## 2019-06-26 NOTE — ED Notes (Signed)
Paged admitting MD regarding pt CBG results

## 2019-06-26 NOTE — ED Notes (Addendum)
Per admitting, does not want pt on a temp foley but instead wants full vitals per floor protocol. Aware of 92 degree core temp. Keep on bair hugger at this time. Attempted to get further access on pt but unable to do so to administer vanco. PICC line orders in, will admin once further access is established.

## 2019-06-26 NOTE — ED Triage Notes (Signed)
Pt arrives via ems from home after accidentally ripping out her Milrinone drip. Pt discharged yesterday. Per ems, family stated her mental status has waxed and waned throughout the night. Pt on 3L [place] continuously. Pt cool to the touch on arrival.  125/59 HR 88 sinus with PVC's 94% 3L [place] RR 24

## 2019-06-26 NOTE — ED Provider Notes (Signed)
[facility] EMERGENCY DEPARTMENT Provider Note   CSN: [id number] Arrival date & time: 07/08/2019  2725     History Chief Complaint  Patient presents with  . milrinone drip out    Leah [name] is a 69 y.o. female.  HPI Patient was just discharged from the hospital yesterday on a milrinone drip.  As occurred at her prior hospitalization, her milrinone access line was pulled out accidentally.  It has been off for unspecified amount of time.  Patient estimated an hour.  She reports she feels very short of breath.  She is at 3 L home oxygen at baseline.  Shortness of breath has worsened since her infusion stopped.  She reports feeling extremely weak.  Patient is too weak to do any activities.    Past Medical History:  Diagnosis Date  . Anemia   . Bronchogenic lung cancer, right ([place]) 11/29/2016  . CAD (coronary artery disease)    a. s/p LIMA-LAD in 11/2014  . Carotid artery occlusion   . CHF (congestive heart failure) ([place])   . Complication of anesthesia    slow to awaken x 1 maybe 2001  . COPD (chronic obstructive pulmonary disease) ([place])   . GERD (gastroesophageal reflux disease)    "sometimes" takes Copywriter, advertising or drinks gingerale   . Headache(784.0)   . History of kidney stones   . Hyperlipidemia   . Hypertension   . Leg pain   . Peripheral vascular disease ([place])   . Renal vascular disease 10/26/2014   bilateral stents placed   . Severe mitral regurgitation    a. s/p MVR in 11/2014 with a pericardial tissue valve  . Shortness of breath dyspnea   . Tobacco abuse     Patient Active Problem List   Diagnosis Date Noted  . Anxiety state   . CHF (congestive heart failure) ([place]) 06/23/2019  . CKD (chronic kidney disease), stage III 06/22/2019  . Palliative care patient 06/22/2019  . Pressure injury of skin 05/31/2019  . Acute renal failure with acute tubular necrosis superimposed on stage 3a chronic kidney disease ([place])   . Acute on chronic combined  systolic and diastolic heart failure ([place])   . Terminal care   . NSTEMI (non-ST elevated myocardial infarction) ([place])   . Palliative care by specialist   . Protein-calorie malnutrition, severe 05/19/2019  . AMS (altered mental status) 05/18/2019  . Cardiogenic shock ([place]) 05/18/2019  . AKI (acute kidney injury) ([place])   . Dyspnea 04/15/2019  . Thoracic aortic aneurysm without rupture ([place]) 09/03/2018  . Viral upper respiratory illness 03/16/2018  . Hypokalemia 02/12/2018  . Claudication ([place]) 11/21/2017  . Seasonal allergies 07/28/2017  . Bilateral sciatica 07/02/2017  . Bilateral hearing loss due to cerumen impaction 07/02/2017  . Biventricular heart failure ([place]) 06/05/2017  . Bronchogenic lung cancer, right ([place]) 11/29/2016  . Dysphagia 08/26/2016  . Health care maintenance 04/17/2016  . Dizziness 04/17/2016  . Decreased visual acuity 01/17/2016  . Depressed mood 07/27/2015  . S/P MVR (mitral valve replacement) 12/05/2014  . Moderate aortic regurgitation 10/26/2014  . Renal vascular disease 10/26/2014  . Acute on chronic diastolic congestive heart failure ([place]) 10/21/2014  . Tobacco use 10/21/2014  . Diastolic dysfunction, grade 2 by echo June 2016 10/21/2014  . Carpal tunnel syndrome 10/17/2014  . Severe mitral regurgitation 08/25/2014  . Unstable angina ([place]) 08/22/2014  . Low back pain 07/05/2012  . PVC (premature ventricular contraction) 10/02/2011  . History of angioedema with ACE 2013 06/14/2011  .  PVD- s/p multiple proceedures 06/04/2011  . Hyperlipidemia 09/28/2008  . Essential hypertension, benign 09/28/2008  . CAD S/P LAD DES 2009 with 70% ISR 08/24/14 09/28/2008    Past Surgical History:  Procedure Laterality Date  . ABDOMINAL AORTOGRAM W/LOWER EXTREMITY Left 09/16/2018   Procedure: ABDOMINAL AORTOGRAM W/LOWER EXTREMITY;  Surgeon: Marty Heck, MD;  Location: [place] [facility];  Service: Cardiovascular;  Laterality: Left;  . ABDOMINAL HYSTERECTOMY    .  ANGIOPLASTY / STENTING ILIAC  2010   right external iliac by Dr. Irish Lack  . CARDIAC CATHETERIZATION  11/13/11   Left Heart Cath. with Coronary Angiogram  . CARDIAC CATHETERIZATION N/A 08/24/2014   Procedure: Left Heart Cath and Coronary Angiography;  Surgeon: Wellington Hampshire, MD;  Location: [place] [facility];  Service: Cardiovascular;  Laterality: N/A;  . CARDIAC CATHETERIZATION N/A 10/26/2014   Procedure: Right/Left Heart Cath and Coronary Angiography;  Surgeon: Peter M Martinique, MD; oLAD 70%, mLAD 70% ISR, D2 30%, OFC 30%, RCA 20%, EF nl, low R heart pressures after diuresis, severe MR  . CAROTID ENDARTERECTOMY  11/21/2007   left  . COLONOSCOPY W/ POLYPECTOMY    . CORONARY ANGIOPLASTY WITH STENT PLACEMENT  6/09   LAD 2.5x12 Promus  . CORONARY ARTERY BYPASS GRAFT N/A 12/05/2014   Procedure: CORONARY ARTERY BYPASS GRAFTING (CABG);  Surgeon: [name], MD;  Location: [place];  Service: Open Heart Surgery;  Laterality: N/A;  Times 1 using left internal mammary artery to LAD  . FEMORAL-POPLITEAL BYPASS GRAFT  10/12   left Dr. Kellie Simmering  . IR FLUORO GUIDE CV LINE LEFT  06/02/2019  . IR US GUIDE VASC ACCESS LEFT  06/02/2019  . LOWER EXTREMITY ANGIOGRAPHY N/A 02/04/2018   Procedure: LOWER EXTREMITY ANGIOGRAPHY;  Surgeon: Marty Heck, MD;  Location: [place] [facility];  Service: Cardiovascular;  Laterality: N/A;  . MITRAL VALVE REPAIR  2016  . MITRAL VALVE REPLACEMENT N/A 12/05/2014   Procedure: MITRAL VALVE (MV) REPLACEMENT;  Surgeon: [name], MD;  Location: [place];  Service: Open Heart Surgery;  Laterality: N/A;  Closure left atrial appendage  . PERIPHERAL VASCULAR BALLOON ANGIOPLASTY Bilateral 09/16/2018   Procedure: PERIPHERAL VASCULAR BALLOON ANGIOPLASTY;  Surgeon: Marty Heck, MD;  Location: [place] [facility];  Service: Cardiovascular;  Laterality: Bilateral;  bilateral renal arteries, Left external iliac  . PERIPHERAL VASCULAR INTERVENTION Left 02/04/2018   Procedure:  PERIPHERAL VASCULAR INTERVENTION;  Surgeon: Marty Heck, MD;  Location: [place] [facility];  Service: Cardiovascular;  Laterality: Left;  external iliac  . RENAL ANGIOGRAPHY Bilateral 09/16/2018   Procedure: RENAL ANGIOGRAPHY;  Surgeon: Marty Heck, MD;  Location: [place] [facility];  Service: Cardiovascular;  Laterality: Bilateral;  . RENAL ARTERY STENT Bilateral   . TEE WITHOUT CARDIOVERSION N/A 10/24/2014   Procedure: TRANSESOPHAGEAL ECHOCARDIOGRAM (TEE);  Surgeon: Thayer Headings, MD;  Location: [place];  Service: Cardiovascular;  Laterality: N/A;  . TEE WITHOUT CARDIOVERSION N/A 12/05/2014   Procedure: TRANSESOPHAGEAL ECHOCARDIOGRAM (TEE);  Surgeon: [name], MD;  Location: [place];  Service: Open Heart Surgery;  Laterality: N/A;  . VIDEO ASSISTED THORACOSCOPY (VATS)/WEDGE RESECTION Right 11/29/2016   Procedure: VIDEO ASSISTED THORACOSCOPY (VATS)/ RUL WEDGE RESECTION OF LESION/ NODE SAMPLING;  Surgeon: [name], MD;  Location: [place];  Service: Thoracic;  Laterality: Right;  Marland Kitchen VIDEO BRONCHOSCOPY N/A 11/29/2016   Procedure: VIDEO BRONCHOSCOPY;  Surgeon: [name], MD;  Location: [place];  Service: Thoracic;  Laterality: N/A;  OB History   No obstetric history on file.     Family History  Problem Relation Age of Onset  . Cancer Mother        BRAIN AND LUNG  . Hypertension Mother   . Hypertension Father   . Heart disease Father   . Prostate cancer Father   . Kidney disease Father   . Hypertension Brother   . ALS Brother   . Stroke Paternal Aunt        great aunt  . Heart attack Neg Hx   . Colon cancer Neg Hx   . Stomach cancer Neg Hx   . Rectal cancer Neg Hx   . Esophageal cancer Neg Hx   . Liver cancer Neg Hx     Social History   Tobacco Use  . Smoking status: Current Every Day Smoker    Packs/day: 0.50    Years: 35.00    Pack years: 17.50    Types: Cigarettes  . Smokeless tobacco: Never Used  Substance Use Topics  .  Alcohol use: Yes    Alcohol/week: 0.0 standard drinks    Comment: occasion  . Drug use: No    Home Medications Prior to Admission medications   Medication Sig Start Date End Date Taking? Authorizing Provider  albuterol (PROVENTIL HFA;VENTOLIN HFA) 108 (90 Base) MCG/ACT inhaler Inhale 2 puffs into the lungs every 6 (six) hours as needed for wheezing or shortness of breath. 02/20/16   Archie Patten, MD  ALPRAZolam Duanne Moron) 0.25 MG tablet Take 1 tablet (0.25 mg total) by mouth 3 (three) times daily as needed for anxiety or sleep. 06/25/19   Lind Covert, MD  amiodarone (PACERONE) 200 MG tablet Take 1 tablet (200 mg total) by mouth daily. 06/25/19   Autry-Lott, Naaman Plummer, DO  aspirin EC 81 MG tablet Take 1 tablet (81 mg total) by mouth daily. 06/03/19   [name], MD  clopidogrel (PLAVIX) 75 MG tablet Take 1 tablet (75 mg total) by mouth daily. 06/03/19   [name], MD  cyclobenzaprine (FLEXERIL) 10 MG tablet Take 1 tablet (10 mg total) by mouth 3 (three) times daily as needed for muscle spasms. 06/03/19   [name], MD  docusate sodium (COLACE) 100 MG capsule Take 100 mg by mouth daily.    [provider]  feeding supplement, ENSURE ENLIVE, (ENSURE ENLIVE) LIQD Take 237 mLs by mouth 3 (three) times daily between meals. 06/03/19   [name], MD  fluticasone (FLOVENT HFA) 44 MCG/ACT inhaler Inhale 1 puff into the lungs daily. 04/12/19   Guadalupe Dawn, MD  furosemide (LASIX) 80 MG tablet Take 1 tablet (80 mg total) by mouth daily. 07/01/2019   Autry-Lott, Naaman Plummer, DO  gabapentin (NEURONTIN) 100 MG capsule Take 2 capsules (200 mg total) by mouth at bedtime. 06/03/19   [name], MD  Hydrocortisone (GERHARDT'S BUTT CREAM) CREA Apply 1 application topically daily. Apply to affected areas once daly 06/03/19   [name], MD  isosorbide-hydrALAZINE (BIDIL) 20-37.5 MG tablet Take 1 tablet by mouth 3 (three) times daily. 06/25/19   Autry-Lott, Naaman Plummer, DO  milrinone (PRIMACOR) 20  MG/100 ML SOLN infusion Inject 0.0159 mg/min into the vein continuous. 06/03/19   [name], MD  Multiple Vitamin (MULTIVITAMIN WITH MINERALS) TABS tablet Take 1 tablet by mouth daily. Patient not taking: Reported on 06/22/2019 06/03/19   [name], MD  Multiple Vitamins-Minerals (CEROVITE SENIOR) TABS Take 1 tablet by mouth daily.    [provider]  Nutritional Supplement LIQD Take  120 mLs by mouth 2 (two) times daily. MEDPASS    [provider]  oxyCODONE (OXY IR/ROXICODONE) 5 MG immediate release tablet Take 1 tablet (5 mg total) by mouth every 4 (four) hours as needed for moderate pain (dyspnea/air hunger/tachypnea). 06/25/19   Lind Covert, MD  OXYGEN Inhale 2 L into the lungs continuous.    [provider]  polyethylene glycol (MIRALAX / GLYCOLAX) 17 g packet Take 17 g by mouth daily as needed for mild constipation. 06/03/19   [name], MD  potassium chloride SA (KLOR-CON) 20 MEQ tablet Take 2 tablets (40 mEq total) by mouth 2 (two) times daily. 06/25/19   Autry-Lott, Naaman Plummer, DO  senna (SENOKOT) 8.6 MG TABS tablet Take 1 tablet (8.6 mg total) by mouth daily. 06/03/19   [name], MD    Allergies    Lisinopril, Chantix [varenicline], and Penicillins  Review of Systems   Review of Systems Level 5 caveat cannot obtain review of systems due to patient condition. Physical Exam Updated Vital Signs BP 132/74   Pulse 89   Temp (!) 95 F (35 C) (Rectal)   Resp (!) 21   Physical Exam Constitutional:      Comments: Patient is very thin and deconditioned.  As I come in the room she is slumped over in the stretcher.  Mild to moderate increased work of breathing.  Blood pressure is normotensive heart rate on the monitor is sinus rhythm in the 80s narrow complex.  Oxygen saturation is not reading.  HENT:     Head: Normocephalic and atraumatic.     Mouth/Throat:     Mouth: Mucous membranes are moist.     Pharynx: Oropharynx is clear.   Cardiovascular:     Rate and Rhythm: Normal rate and regular rhythm.  Pulmonary:     Comments: Mild to moderate increased work of breathing.  Lungs are grossly clear.  Good airflow to the bases.  No significant crackle. Abdominal:     General: There is no distension.     Palpations: Abdomen is soft.     Tenderness: There is no abdominal tenderness.  Musculoskeletal:     Comments: 1-2+ edema bilateral lower extremities.  Calves nontender.  Skin:    General: Skin is warm and dry.     Coloration: Skin is pale.  Neurological:     Comments: Patient is moderately somnolent.  She is awake and answers my questions but is very listless.  Speech does seem situationally appropriate but she is difficult to understand.  No apparent focal motor deficit but patient is generally very weak.     ED Results / Procedures / Treatments   Labs (all labs ordered are listed, but only abnormal results are displayed) Labs Reviewed  BASIC METABOLIC PANEL  LACTIC ACID, PLASMA  LACTIC ACID, PLASMA  BRAIN NATRIURETIC PEPTIDE  CBC WITH DIFFERENTIAL/PLATELET  PROTIME-INR  MAGNESIUM  COOXEMETRY PANEL  BLOOD GAS, ARTERIAL  CBG MONITORING, ED  TROPONIN I (HIGH SENSITIVITY)    EKG EKG Interpretation  Date/Time:  Saturday June 26 2019 09:35:15 EDT Ventricular Rate:  90 PR Interval:    QRS Duration: 102 QT Interval:  383 QTC Calculation: 469 R Axis:   -68 Text Interpretation: Sinus rhythm no acute changes from previous Confirmed by Charlesetta Shanks [phone]) on 07/03/2019 10:31:40 AM   Radiology DG Chest Port 1 View  Result Date: 07/13/2019 CLINICAL DATA:  Shortness of breath EXAM: PORTABLE CHEST 1 VIEW COMPARISON:  June 22, 2019 FINDINGS: There is airspace  opacity in the left mid lung and right lower lung regions. There is a degree of atelectatic change in the left base. Postoperative change noted in right upper lobe region, stable. There is cardiomegaly with pulmonary vascularity within normal limits.  Patient is status post median sternotomy with previous mitral valve replacement. No adenopathy evident. No bone lesions. IMPRESSION: Multifocal pneumonia with airspace opacity in the right lower lung region and left mid lung regions. Left base atelectasis noted. Postoperative change right upper lobe. Stable cardiomegaly. Status post mitral valve replacement. No adenopathy evident. Electronically Signed   By: Lowella Grip III M.D.   On: 07/01/2019 10:22    Procedures Procedures (including critical care time) CRITICAL CARE Performed by: Charlesetta Shanks   Total critical care time: 30 minutes  Critical care time was exclusive of separately billable procedures and treating other patients.  Critical care was necessary to treat or prevent imminent or life-threatening deterioration.  Critical care was time spent personally by me on the following activities: development of treatment plan with patient and/or surrogate as well as nursing, discussions with consultants, evaluation of patient's response to treatment, examination of patient, obtaining history from patient or surrogate, ordering and performing treatments and interventions, ordering and review of laboratory studies, ordering and review of radiographic studies, pulse oximetry and re-evaluation of patient's condition. Medications Ordered in ED Medications  milrinone (PRIMACOR) 20 MG/100 ML (0.2 mg/mL) infusion (0.25 mcg/kg/min  64.2 kg Intravenous New Bag/Given 07/09/2019 1026)    ED Course  I have reviewed the triage vital signs and the nursing notes.  Pertinent labs & imaging results that were available during my care of the patient were reviewed by me and considered in my medical decision making (see chart for details).  Clinical Course as of Jun 26 1150  Sat Jun 26, 2019  1125 Consult: Dr. Sallyanne Kuster consulted.   [MP]  1151 Consult: Reviewed with Dr. Higinio Plan for admission. Consult: Dr. Sallyanne Kuster has reviewed the chart and advised that  patient is appropriate for palliative care.  Very low utility in continuing the milrinone drip.  This was done predominantly on family preference.  From cardiology perspective patient is to continue with palliative care.   [MP]    Clinical Course User Index [MP] Charlesetta Shanks, MD   MDM Rules/Calculators/A&P                     Patient presents as outlined.  She has severe chronic CHF with EF less than 20%.  Milrinone access has accidentally been pulled out for a second episode within less than 24 hours of discharge from the hospital.  Today, patient's lactic acid is severely elevated.  Infiltrates present in the bases of the lungs possible HCAP versus CHF.  At this time have reinitiated the milrinone drip and administered HCAP antibiotics.  Patient is primarily with family practice teaching service.  Dr. Higinio Plan will review plan and goals with family for final disposition and management. Final Clinical Impression(s) / ED Diagnoses Final diagnoses:  Acute on chronic respiratory failure, unspecified whether with hypoxia or hypercapnia (HCC)  Chronic heart failure, unspecified heart failure type ([place])  Severe comorbid illness    Rx / DC Orders ED Discharge Orders    None       Charlesetta Shanks, MD 07/05/2019 1156

## 2019-06-26 NOTE — ED Notes (Signed)
RT at the bedside collecting blood gas

## 2019-06-26 NOTE — Progress Notes (Signed)
Patients baseline on [place]-hugger with hospice care.

## 2019-06-26 NOTE — H&P (Addendum)
[facility] Admission History and Physical Service Pager: [phone]  Patient name: [name] Medical record number: [id number] Date of birth: Sep 15, 1950 Age: 69 y.o. Gender: female  Primary Care Provider: Guadalupe Dawn, MD Consultants: Palliative, Cardiology (curbside) Code Status: DNR Preferred Emergency Contact: [name] [phone]  Chief Complaint: SOB   Assessment and Plan: [name] is a 69 y.o. female presenting with worsening shortness of breath after PICC line infusing milrinone accidentally pulled out. PMH is significant for viral cardiomyopathy, CAD, HTN, HLD, PVD, HFrEF (15%), CKD, MV replacement, status post resection for bronchogenic lung cancer, tobacco use.  Shortness of breath likely secondary to end-stage biventricular heart failure on milrinone drip with EF less than 20% Patient presents with worsening shortness of breath after 1 hour of milrinone infusion stopped due to accidental pulling out of PICC line, 2nd admit for this, just discharged yesterday evening. She is known end-stage, persistently in shock despite palliative milrinone with poor prognosis and planned for home hospice, however had yet to establish care due to quick return. Discussed with Dr. Haroldine Laws and Dr. Sallyanne Kuster, cardiology/HF, who both recommended no further interventions. Palliative care also consulted and will assess patient in a.m. Labs significant for creatinine 2.61, anion gap 25, BNP >4500, troponin 128>114, lactic acid 10.3>11.0.  pH 7.229, HCO3 14.1, PCO2 33.8, PO2 219 on 100% nonrebreather. WBC 16.3 with neutrophilic shift 84.5. Chest xray shows multifocal pneumonia with airspace in the right lower region and left mid lung with left base atelectases.  On exam patient is lethargic but easy to arouse. Hypothermic, rectal temp 95-->92.5, with Bair hugger rectal temp 92.7 and hemodynamically stable.  Tripod sitting.  Elevated JVD and  significant bilateral lower extremity edema Lt>Rt.  Lung exam noted to have fine crackles throughout lung fields and diminished air entry at the bases. Worsening shortness of breath likely secondary to interruption in Milrinone palliative therapy, she has already reaccumulated significant peripheral and pulmonary edema since seen yesterday on discharge. Likely also dyspneic due to respiratory compensation from her significant metabolic lactic acidosis with tissue hypoxia. Will give Lasix 40 mg IV x1 and monitor output.  Also considered PNA and given leukocytosis with neutrophilic shift, chest xray showing multifocal pneumonia and family report of feeling warm last night will continue IV antibiotics. Will admit for hopeful stabilization per family's wishes and continued palliative conversations with the  understanding that this is not a sustainable long-term plan.  -Admit to stepdown, Attending Dr. Erin Hearing -Discussed with cardiology/HF, no further recommendations -Palliative Consult, will see tomorrow am -[place] I&O's -Daily weight -Cefepime 2 gm (04/03-) -Vancomycin 1.5 gm (04/03-) -Lasix 40 mg IV BID  -Continue home medications:   Amiodarone 200 mg daily  ASA 81 mg daily  Bidil 20-37.5 mg TID  KCL CR 40 mEq BID -Continue Milrinone infusion -Xanax 0.25mg  TID PRN anxiety  -Oxycodone 5mg  q 4 PRN for air hunger, pain, can give morphine IV if needed as well   -Foley catheter for temperature monitoring -Bair Hugger for Hypothermia   AKI on CKD III:  Creatinine 2.61.  Baseline from (02/21) 1.6-2.0.  Family reports little po intake since discharge from hospital yesterday evening. Likely due to hypervolemic state. -Continue to monitor -Lasix 40 mg IV x1  PAD Stable  -Continue home medication Clopidogrel 75 mg daily,Gabapentin 200 mg daily   Frequent PVCs: Well-controlled with amiodarone, will continue 200 mg daily.  FEN/GI:  -NPO -Advance diet when more alert Prophylaxis:   -Lovenox  Disposition: Stepdown, Attending Dr.  Chambliss  History of Present Illness:  [name] is a 69 y.o. female presenting with shortness of breath.  History obtained by Son and Daughter.  [name] was discharged from hospital 04/02 for end stage biventricular failure on Milrinone drip.  She arrived home between 8-9 pm and was in her usual state of health until about 10 pm when she started to become confused.  Her son reports she had a panic attack prior to this and he gave her some xanax as prescribed.  She started to settle and rested a little but around 4 am she pulled out her central line that was infusing Milrinone.  She became significantly short of breath about an hour after the Milrinone drip was pulled out.  Hospice was supposed to see and admit today but son called when mom couldn't swallow and wasn't herself.  They recommended calling EMS. Denies any fevers, cough, chest pain but daughter reports that she did feel a little warmer last night.  No nausea or vomiting.  Son reports no urine output since discharge.  She has had some increase in lower extremity edema since discharge.  In the ED she was hypothermic, rectal Temp 95 and hypoxic O2 sats 73-98% on 3L [place]. Labs significant for K 5.3 repeat 4.7, Glucose 65-->58, D50 x1 amp, Lactic Acid 10.3, AGA 25. BNP >4500, Troponin 128.  Chest xray shows multifocal pneumonia with airspace opacity in the RLL and LML with Left base atelectasis. Stable cardiomegaly s/p mitral valve replacement.  She was give IV Cefepime 2g and Vancomycin 1.5g and Milrinone was restarted at home dose 4.19mL/hr.   Review Of Systems: Per HPI with the following additions:   Review of Systems  Constitutional: Negative for chills, diaphoresis, fever and weight loss.  Respiratory: Positive for shortness of breath. Negative for cough, hemoptysis and sputum production.   Cardiovascular: Positive for orthopnea and leg swelling. Negative for chest pain and  palpitations.  Gastrointestinal: Negative for abdominal pain, constipation, diarrhea and nausea.  Genitourinary: Negative for dysuria.  Neurological: Positive for weakness.    Patient Active Problem List   Diagnosis Date Noted  . Anxiety state   . CHF (congestive heart failure) ([place]) 06/23/2019  . CKD (chronic kidney disease), stage III 06/22/2019  . Palliative care patient 06/22/2019  . Pressure injury of skin 05/31/2019  . Acute renal failure with acute tubular necrosis superimposed on stage 3a chronic kidney disease ([place])   . Acute on chronic combined systolic and diastolic heart failure ([place])   . Terminal care   . NSTEMI (non-ST elevated myocardial infarction) ([place])   . Palliative care by specialist   . Protein-calorie malnutrition, severe 05/19/2019  . AMS (altered mental status) 05/18/2019  . Cardiogenic shock ([place]) 05/18/2019  . AKI (acute kidney injury) ([place])   . Dyspnea 04/15/2019  . Thoracic aortic aneurysm without rupture ([place]) 09/03/2018  . Viral upper respiratory illness 03/16/2018  . Hypokalemia 02/12/2018  . Claudication ([place]) 11/21/2017  . Seasonal allergies 07/28/2017  . Bilateral sciatica 07/02/2017  . Bilateral hearing loss due to cerumen impaction 07/02/2017  . Biventricular heart failure ([place]) 06/05/2017  . Bronchogenic lung cancer, right ([place]) 11/29/2016  . Dysphagia 08/26/2016  . Health care maintenance 04/17/2016  . Dizziness 04/17/2016  . Decreased visual acuity 01/17/2016  . Depressed mood 07/27/2015  . S/P MVR (mitral valve replacement) 12/05/2014  . Moderate aortic regurgitation 10/26/2014  . Renal vascular disease 10/26/2014  . Acute on chronic diastolic congestive heart failure ([place]) 10/21/2014  .  Tobacco use 10/21/2014  . Diastolic dysfunction, grade 2 by echo June 2016 10/21/2014  . Carpal tunnel syndrome 10/17/2014  . Severe mitral regurgitation 08/25/2014  . Unstable angina ([place]) 08/22/2014  . Low back pain 07/05/2012  . PVC (premature  ventricular contraction) 10/02/2011  . History of angioedema with ACE 2013 06/14/2011  . PVD- s/p multiple proceedures 06/04/2011  . Hyperlipidemia 09/28/2008  . Essential hypertension, benign 09/28/2008  . CAD S/P LAD DES 2009 with 70% ISR 08/24/14 09/28/2008    Past Medical History: Past Medical History:  Diagnosis Date  . Anemia   . Bronchogenic lung cancer, right ([place]) 11/29/2016  . CAD (coronary artery disease)    a. s/p LIMA-LAD in 11/2014  . Carotid artery occlusion   . CHF (congestive heart failure) ([place])   . Complication of anesthesia    slow to awaken x 1 maybe 2001  . COPD (chronic obstructive pulmonary disease) ([place])   . GERD (gastroesophageal reflux disease)    "sometimes" takes Copywriter, advertising or drinks gingerale   . Headache(784.0)   . History of kidney stones   . Hyperlipidemia   . Hypertension   . Leg pain   . Peripheral vascular disease ([place])   . Renal vascular disease 10/26/2014   bilateral stents placed   . Severe mitral regurgitation    a. s/p MVR in 11/2014 with a pericardial tissue valve  . Shortness of breath dyspnea   . Tobacco abuse     Past Surgical History: Past Surgical History:  Procedure Laterality Date  . ABDOMINAL AORTOGRAM W/LOWER EXTREMITY Left 09/16/2018   Procedure: ABDOMINAL AORTOGRAM W/LOWER EXTREMITY;  Surgeon: Marty Heck, MD;  Location: [place] [facility];  Service: Cardiovascular;  Laterality: Left;  . ABDOMINAL HYSTERECTOMY    . ANGIOPLASTY / STENTING ILIAC  2010   right external iliac by Dr. Irish Lack  . CARDIAC CATHETERIZATION  11/13/11   Left Heart Cath. with Coronary Angiogram  . CARDIAC CATHETERIZATION N/A 08/24/2014   Procedure: Left Heart Cath and Coronary Angiography;  Surgeon: Wellington Hampshire, MD;  Location: [place] [facility];  Service: Cardiovascular;  Laterality: N/A;  . CARDIAC CATHETERIZATION N/A 10/26/2014   Procedure: Right/Left Heart Cath and Coronary Angiography;  Surgeon: Peter M Martinique, MD; oLAD 70%, mLAD 70%  ISR, D2 30%, OFC 30%, RCA 20%, EF nl, low R heart pressures after diuresis, severe MR  . CAROTID ENDARTERECTOMY  11/21/2007   left  . COLONOSCOPY W/ POLYPECTOMY    . CORONARY ANGIOPLASTY WITH STENT PLACEMENT  6/09   LAD 2.5x12 [place]  . CORONARY ARTERY BYPASS GRAFT N/A 12/05/2014   Procedure: CORONARY ARTERY BYPASS GRAFTING (CABG);  Surgeon: [name], MD;  Location: [place] [facility];  Service: Open Heart Surgery;  Laterality: N/A;  Times 1 using left internal mammary artery to LAD  . FEMORAL-POPLITEAL BYPASS GRAFT  10/12   left Dr. Kellie Simmering  . IR FLUORO GUIDE CV LINE LEFT  06/02/2019  . IR US GUIDE VASC ACCESS LEFT  06/02/2019  . LOWER EXTREMITY ANGIOGRAPHY N/A 02/04/2018   Procedure: LOWER EXTREMITY ANGIOGRAPHY;  Surgeon: Marty Heck, MD;  Location: [place] [facility];  Service: Cardiovascular;  Laterality: N/A;  . MITRAL VALVE REPAIR  2016  . MITRAL VALVE REPLACEMENT N/A 12/05/2014   Procedure: MITRAL VALVE (MV) REPLACEMENT;  Surgeon: [name], MD;  Location: [place];  Service: Open Heart Surgery;  Laterality: N/A;  Closure left atrial appendage  . PERIPHERAL VASCULAR BALLOON ANGIOPLASTY Bilateral 09/16/2018   Procedure:  PERIPHERAL VASCULAR BALLOON ANGIOPLASTY;  Surgeon: Marty Heck, MD;  Location: [place] [facility];  Service: Cardiovascular;  Laterality: Bilateral;  bilateral renal arteries, Left external iliac  . PERIPHERAL VASCULAR INTERVENTION Left 02/04/2018   Procedure: PERIPHERAL VASCULAR INTERVENTION;  Surgeon: Marty Heck, MD;  Location: [facility] [facility];  Service: Cardiovascular;  Laterality: Left;  external iliac  . RENAL ANGIOGRAPHY Bilateral 09/16/2018   Procedure: RENAL ANGIOGRAPHY;  Surgeon: Marty Heck, MD;  Location: [facility] [place] [facility];  Service: Cardiovascular;  Laterality: Bilateral;  . RENAL ARTERY STENT Bilateral   . TEE WITHOUT CARDIOVERSION N/A 10/24/2014   Procedure: TRANSESOPHAGEAL ECHOCARDIOGRAM (TEE);  Surgeon: Thayer Headings,  MD;  Location: [facility];  Service: Cardiovascular;  Laterality: N/A;  . TEE WITHOUT CARDIOVERSION N/A 12/05/2014   Procedure: TRANSESOPHAGEAL ECHOCARDIOGRAM (TEE);  Surgeon: [name], MD;  Location: [facility];  Service: Open Heart Surgery;  Laterality: N/A;  . VIDEO ASSISTED THORACOSCOPY (VATS)/WEDGE RESECTION Right 11/29/2016   Procedure: VIDEO ASSISTED THORACOSCOPY (VATS)/ RUL WEDGE RESECTION OF LESION/ NODE SAMPLING;  Surgeon: [name], MD;  Location: [place];  Service: Thoracic;  Laterality: Right;  Marland Kitchen VIDEO BRONCHOSCOPY N/A 11/29/2016   Procedure: VIDEO BRONCHOSCOPY;  Surgeon: [name], MD;  [facility];  Service: Thoracic;  Laterality: N/A;    Social History: Social History   Tobacco Use  . Smoking status: Current Every Day Smoker    Packs/day: 0.50    Years: 35.00    Pack years: 17.50    Types: Cigarettes  . Smokeless tobacco: Never Used  Substance Use Topics  . Alcohol use: Yes    Alcohol/week: 0.0 standard drinks    Comment: occasion  . Drug use: No   Additional social history: Lives with daughter  Please also refer to relevant sections of EMR.  Family History: Family History  Problem Relation Age of Onset  . Cancer Mother        BRAIN AND LUNG  . Hypertension Mother   . Hypertension Father   . Heart disease Father   . Prostate cancer Father   . Kidney disease Father   . Hypertension Brother   . ALS Brother   . Stroke Paternal Aunt        great aunt  . Heart attack Neg Hx   . Colon cancer Neg Hx   . Stomach cancer Neg Hx   . Rectal cancer Neg Hx   . Esophageal cancer Neg Hx   . Liver cancer Neg Hx      Allergies and Medications: Allergies  Allergen Reactions  . Lisinopril Swelling    Angioedema 06/10/11  . Chantix [Varenicline] Other (See Comments)    Caused insomnia  . Penicillins Hives    Did it involve swelling of the face/tongue/throat, SOB, or low BP? No Did it involve sudden or severe rash/hives, skin peeling, or any  reaction on the inside of your mouth or nose? No Did you need to seek medical attention at a hospital or doctor's office? No When did it last happen?50 Years If all above answers are "NO", may proceed with cephalosporin use.     No current facility-administered medications on file prior to encounter.   Current Outpatient Medications on File Prior to Encounter  Medication Sig Dispense Refill  . albuterol (PROVENTIL HFA;VENTOLIN HFA) 108 (90 Base) MCG/ACT inhaler Inhale 2 puffs into the lungs every 6 (six) hours as needed for wheezing or shortness of breath. 1 Inhaler 0  .  ALPRAZolam (XANAX) 0.25 MG tablet Take 1 tablet (0.25 mg total) by mouth 3 (three) times daily as needed for anxiety or sleep. 30 tablet 0  . amiodarone (PACERONE) 200 MG tablet Take 1 tablet (200 mg total) by mouth daily. 30 tablet 0  . aspirin EC 81 MG tablet Take 1 tablet (81 mg total) by mouth daily. 90 tablet 1  . clopidogrel (PLAVIX) 75 MG tablet Take 1 tablet (75 mg total) by mouth daily. 30 tablet 6  . cyclobenzaprine (FLEXERIL) 10 MG tablet Take 1 tablet (10 mg total) by mouth 3 (three) times daily as needed for muscle spasms. 30 tablet 0  . docusate sodium (COLACE) 100 MG capsule Take 100 mg by mouth daily.    . feeding supplement, ENSURE ENLIVE, (ENSURE ENLIVE) LIQD Take 237 mLs by mouth 3 (three) times daily between meals. 237 mL 12  . fluticasone (FLOVENT HFA) 44 MCG/ACT inhaler Inhale 1 puff into the lungs daily. 2 Inhaler 0  . furosemide (LASIX) 80 MG tablet Take 1 tablet (80 mg total) by mouth daily. 30 tablet 0  . gabapentin (NEURONTIN) 100 MG capsule Take 2 capsules (200 mg total) by mouth at bedtime.    Marland Kitchen Hydrocortisone (GERHARDT'S BUTT CREAM) CREA Apply 1 application topically daily. Apply to affected areas once daly    . isosorbide-hydrALAZINE (BIDIL) 20-37.5 MG tablet Take 1 tablet by mouth 3 (three) times daily. 90 tablet 0  . milrinone (PRIMACOR) 20 MG/100 ML SOLN infusion Inject 0.0159 mg/min  into the vein continuous.    . Multiple Vitamin (MULTIVITAMIN WITH MINERALS) TABS tablet Take 1 tablet by mouth daily. (Patient not taking: Reported on 06/22/2019)    . Multiple Vitamins-Minerals (CEROVITE SENIOR) TABS Take 1 tablet by mouth daily.    . Nutritional Supplement LIQD Take 120 mLs by mouth 2 (two) times daily. MEDPASS    . oxyCODONE (OXY IR/ROXICODONE) 5 MG immediate release tablet Take 1 tablet (5 mg total) by mouth every 4 (four) hours as needed for moderate pain (dyspnea/air hunger/tachypnea). 30 tablet 0  . OXYGEN Inhale 2 L into the lungs continuous.    . polyethylene glycol (MIRALAX / GLYCOLAX) 17 g packet Take 17 g by mouth daily as needed for mild constipation. 14 each 0  . potassium chloride SA (KLOR-CON) 20 MEQ tablet Take 2 tablets (40 mEq total) by mouth 2 (two) times daily. 120 tablet 0  . senna (SENOKOT) 8.6 MG TABS tablet Take 1 tablet (8.6 mg total) by mouth daily. 120 tablet 0    Objective: BP (!) 122/59   Pulse 83   Temp (!) 95 F (35 C) (Rectal)   Resp (!) 22   SpO2 96%  Exam: General: 69 y.o female that looks older than stated age sitting on stretcher and lethargic but easy to arouse Eyes: PERRL  ENTM: mucus membranes moist   Neck: Elevated JVD Cardiovascular: RRR, on Milrinone drip, no murmur appreciated, distal pulses weak Respiratory: Shallow breaths, tripoding, fine crackles throughout lung fields and diminished at the bases, IWOB, delayed cap refill, on 3L  Gastrointestinal: soft, non tender, non distended. BS present Extremities: cold extremities, significant 2-3+ bilateral lower extremity edema  Derm: no rashes noted Neuro: Lethargic but easy to arouse, oriented to person, place. Speech understandable, can follow commands.   Labs and Imaging: CBC BMET  Recent Labs  Lab 07/22/2019 1003 06/30/2019 1003 06/29/2019 1042  WBC 16.6*  --   --   HGB 11.6*   < > 12.6  HCT 37.2   < >  37.0  PLT PENDING  --   --    < > = values in this interval not  displayed.   Recent Labs  Lab 07/19/2019 1003 07/06/2019 1003 07/02/2019 1042  NA 138   < > 135  K 5.3*   < > 4.7  CL 101  --   --   CO2 12*  --   --   BUN 44*  --   --   CREATININE 2.61*  --   --   GLUCOSE 65*  --   --   CALCIUM 9.6  --   --    < > = values in this interval not displayed.     EKG:  Vent. rate 90 BPM PR interval * ms QRS duration 102 ms QT/QTc 383/469 ms P-R-T axes 86 -68 82 no acute changes from previous Confirmed by Charlesetta Shanks [phone]) on 07/13/2019 10:31:40 AM DG Chest Port 1 View  Result Date: 07/20/2019 PORTABLE CHEST 1 VIEW COMPARISON:  June 22, 2019 IMPRESSION: Multifocal pneumonia with airspace opacity in the right lower lung region and left mid lung regions. Left base atelectasis noted. Postoperative change right upper lobe. Stable cardiomegaly. Status post mitral valve replacement. No adenopathy evident. Electronically Signed   By: Lowella Grip III M.D.   On: 07/02/2019 10:22     Carollee Leitz, MD 07/15/2019, 11:31 AM PGY-1, [place] Intern pager: [phone], text pages welcome  FPTS Upper-Level Resident Addendum   I have independently interviewed and examined the patient. I have discussed the above with the original author and agree with their documentation. My edits for correction/addition/clarification are in green. Please see also any attending notes.    Patriciaann Clan, DO  Family Medicine PGY-2

## 2019-06-26 NOTE — ED Notes (Signed)
Pt placed on bair hugger due to decrease in temp. Paged admitting MD for orders.

## 2019-06-26 NOTE — Progress Notes (Signed)
Patient has a newly placed IV in the left arm plans for PICC line placement tomorrow. PRN applied, Mitten apply due pulling lines and bair-hugger.

## 2019-06-26 NOTE — Plan of Care (Signed)
New readmission less than 24 hour for PICC Line replacement

## 2019-06-26 NOTE — Progress Notes (Signed)
Patient is confused pulling oxygen tubes and clothes off. PRNS to given to relax.  MD paged to talk to son.

## 2019-06-26 NOTE — Care Management (Signed)
Patient is active with [facility] liaison Glyn Ade RN [phone].

## 2019-06-26 NOTE — Progress Notes (Signed)
Pharmacy Antibiotic Note  Leah [name] is a 69 y.o. female admitted on 07/11/2019 with pneumonia.  Pharmacy has been consulted for vancomycin and cefepime dosing.  Noted pcn allergy, pt has tolerated cefepime in past.    Plan: Vancomycin 1500 mg IV x 1, then variable dosing based on renal function Add MRSA PCR Cefepime 2g IV every 24 hours Monitor renal function, Cx/PCR to narrow Vancomycin random level as needed     Temp (24hrs), Avg:95 F (35 C), Min:95 F (35 C), Max:95 F (35 C)  Recent Labs  Lab 06/22/19 0828 06/22/19 0828 06/22/19 1739 06/22/19 1739 06/23/19 0456 06/23/19 1428 06/24/19 0529 06/25/19 0407 06/25/2019 1003  WBC 11.7*  --  12.9*  --  13.1*  --  13.2*  --  16.6*  CREATININE 2.04*   < > 1.88*   < > 1.88* 2.01* 1.90* 1.81* 2.61*  LATICACIDVEN  --   --   --   --   --   --   --   --  10.3*   < > = values in this interval not displayed.    Estimated Creatinine Clearance: 21.5 mL/min (A) (by C-G formula based on SCr of 2.61 mg/dL (H)).    Allergies  Allergen Reactions  . Lisinopril Swelling    Angioedema 06/10/11  . Chantix [Varenicline] Other (See Comments)    Caused insomnia  . Penicillins Hives    Did it involve swelling of the face/tongue/throat, SOB, or low BP? No Did it involve sudden or severe rash/hives, skin peeling, or any reaction on the inside of your mouth or nose? No Did you need to seek medical attention at a hospital or doctor's office? No When did it last happen?50 Years If all above answers are "NO", may proceed with cephalosporin use.     Bertis Ruddy, PharmD Clinical Pharmacist ED Pharmacist Phone # [phone] 06/25/2019 11:36 AM

## 2019-06-27 ENCOUNTER — Inpatient Hospital Stay (HOSPITAL_COMMUNITY): Payer: Medicare Other

## 2019-06-27 DIAGNOSIS — J9620 Acute and chronic respiratory failure, unspecified whether with hypoxia or hypercapnia: Secondary | ICD-10-CM

## 2019-06-27 DIAGNOSIS — I509 Heart failure, unspecified: Secondary | ICD-10-CM

## 2019-06-27 DIAGNOSIS — Z515 Encounter for palliative care: Principal | ICD-10-CM

## 2019-06-27 LAB — CBC WITH DIFFERENTIAL/PLATELET
Abs Immature Granulocytes: 0.4 10*3/uL — ABNORMAL HIGH (ref 0.00–0.07)
Basophils Absolute: 0.1 10*3/uL (ref 0.0–0.1)
Basophils Relative: 0 %
Eosinophils Absolute: 0.2 10*3/uL (ref 0.0–0.5)
Eosinophils Relative: 1 %
HCT: 35.9 % — ABNORMAL LOW (ref 36.0–46.0)
Hemoglobin: 11.4 g/dL — ABNORMAL LOW (ref 12.0–15.0)
Immature Granulocytes: 2 %
Lymphocytes Relative: 6 %
Lymphs Abs: 1 10*3/uL (ref 0.7–4.0)
MCH: 26 pg (ref 26.0–34.0)
MCHC: 31.8 g/dL (ref 30.0–36.0)
MCV: 82 fL (ref 80.0–100.0)
Monocytes Absolute: 0.9 10*3/uL (ref 0.1–1.0)
Monocytes Relative: 5 %
Neutro Abs: 14.7 10*3/uL — ABNORMAL HIGH (ref 1.7–7.7)
Neutrophils Relative %: 86 %
Platelets: 108 10*3/uL — ABNORMAL LOW (ref 150–400)
RBC: 4.38 MIL/uL (ref 3.87–5.11)
RDW: 25.7 % — ABNORMAL HIGH (ref 11.5–15.5)
WBC: 17.2 10*3/uL — ABNORMAL HIGH (ref 4.0–10.5)
nRBC: 1.4 % — ABNORMAL HIGH (ref 0.0–0.2)

## 2019-06-27 LAB — BASIC METABOLIC PANEL
Anion gap: 18 — ABNORMAL HIGH (ref 5–15)
BUN: 60 mg/dL — ABNORMAL HIGH (ref 8–23)
CO2: 18 mmol/L — ABNORMAL LOW (ref 22–32)
Calcium: 9 mg/dL (ref 8.9–10.3)
Chloride: 104 mmol/L (ref 98–111)
Creatinine, Ser: 2.77 mg/dL — ABNORMAL HIGH (ref 0.44–1.00)
GFR calc Af Amer: 20 mL/min — ABNORMAL LOW (ref >60)
GFR calc non Af Amer: 17 mL/min — ABNORMAL LOW (ref >60)
Glucose, Bld: 86 mg/dL (ref 70–99)
Potassium: 4.8 mmol/L (ref 3.5–5.1)
Sodium: 140 mmol/L (ref 135–145)

## 2019-06-27 LAB — GLUCOSE, CAPILLARY: Glucose-Capillary: 82 mg/dL (ref 70–99)

## 2019-06-27 MED ORDER — LORAZEPAM 2 MG/ML IJ SOLN: 0.5000 mg | Freq: Four times a day (QID) | INTRAMUSCULAR | Status: DC | PRN

## 2019-06-27 MED ORDER — FUROSEMIDE 10 MG/ML IJ SOLN: 40.0000 mg | Freq: Two times a day (BID) | INTRAMUSCULAR | Status: DC | PRN

## 2019-06-27 MED ORDER — HALOPERIDOL LACTATE 5 MG/ML IJ SOLN: 2.0000 mg | Freq: Four times a day (QID) | INTRAMUSCULAR | Status: DC | PRN

## 2019-06-27 MED ORDER — ACETAMINOPHEN 650 MG RE SUPP
650.0000 mg | Freq: Four times a day (QID) | RECTAL | Status: DC | PRN
  Administered 2019-06-27: 650 mg via RECTAL
  Filled 2019-06-27: qty 1

## 2019-06-27 MED ORDER — HYDROMORPHONE HCL 1 MG/ML IJ SOLN: 0.5000 mg | Freq: Four times a day (QID) | INTRAMUSCULAR | Status: DC | PRN

## 2019-06-27 MED ORDER — GLYCOPYRROLATE 0.2 MG/ML IJ SOLN
0.2000 mg | INTRAMUSCULAR | Status: DC | PRN
  Filled 2019-06-27: qty 1

## 2019-06-28 ENCOUNTER — Other Ambulatory Visit (HOSPITAL_COMMUNITY): Payer: Self-pay | Admitting: Internal Medicine

## 2019-06-28 NOTE — Telephone Encounter (Signed)
Deceased

## 2019-07-04 ENCOUNTER — Other Ambulatory Visit: Payer: Self-pay | Admitting: Cardiology

## 2019-07-08 ENCOUNTER — Telehealth (HOSPITAL_COMMUNITY): Payer: Medicare Other

## 2019-07-24 NOTE — Progress Notes (Signed)
Spoke with RN re concerns of PICC placement since, per documentation, is continuing to pull out lines.  Documented that pt pulled out tunneled line placed by IR PTA.  Pt has adequate PIV access at this time for meds.  Please evaluate if appropriate to place  PICC line  by VAS Team rather than IR upon rounding this am.  Rn to also speak with MD.

## 2019-07-24 NOTE — Progress Notes (Signed)
Patient lethargic will not respond with sternal rub, also febrile 100.6 oral others v/s stable MD notified, MD came to assess the patient MD will call back for orders or the plan.will continue to monitor the patient.

## 2019-07-24 NOTE — Death Summary Note (Signed)
[facility] Death Summary  Patient name: Leah [name] [facility] Medical record number: [id number] Date of birth: 1951-01-16 Age: 69 y.o. Gender: female Date of Admission: Jul 20, 2019  Date of Death: Jul 21, 2019 Admitting Physician: Carollee Leitz, MD  Primary Care Provider: Guadalupe Dawn, MD Consultants: Palliative  Indication for Hospitalization: Dyspnea  Discharge Diagnoses/Problem List:  Viral cardiomyopathy CAD HTN HLD PVD HFrEF (15%)  CKD  MV replacement s/p resection for bronchogenic lung cancer  tobacco use  Disposition: Deceased  Brief Hospital Course:  Leah [name] was a 69 y.o. female who presented with worsening shortness of breath after PICC line infusing milrinone accidentally pulled out. PMH is significant for viral cardiomyopathy, CAD, HTN, HLD, PVD, HFrEF (15%), CKD, MV replacement, status post resection for bronchogenic lung cancer, tobacco use. Her hospital course is outlined below.  Patient presented with worsening shortness of breath after 1 hour of milrinone infusion stopped due to accidental pulling out of PICC line, 2nd admit for this, just discharged home with hospice the evening prior to this admission. See H&P for full admission details. Was found to have multifocal pneumonia and  was started on IV Vancomycin and cefepime. She was initially continued on all home medications including IV milrinone. The following morning she was not responsive to painful stimuli. Palliative was consulted and patient's children contacted and brought to bedside. Full comfort care was decided. The patient expired 2019-07-21 at 2005.     Significant Procedures: N/A  Significant Labs and Imaging:  PORTABLE CHEST 1 VIEW  COMPARISON:  June 22, 2019  IMPRESSION: Multifocal pneumonia with airspace opacity in the right lower lung region and left mid lung regions. Left base atelectasis noted. Postoperative change right upper lobe. Stable cardiomegaly. Status  post mitral valve replacement. No adenopathy evident.  Gerlene Fee, DO 06/29/2019, 4:46 PM PGY-1, [place]

## 2019-07-24 NOTE — Progress Notes (Signed)
Patient expired at 2005. No pulse. No respirations. Asystole on telemetry. Confirmed with 2nd RN. On call MD paged. Next of kin to be notified.

## 2019-07-24 NOTE — Progress Notes (Addendum)
Daily Progress Note   Patient Name: Leah [name]       Date: 07-21-19 DOB: 1950/12/16  Age: 69 y.o. MRN#: [id number] Attending Physician: Lind Covert, MD Primary Care Physician: Guadalupe Dawn, MD Admit Date: 07/21/2019  Reason for Consultation/Follow-up: Establishing goals of care  Subjective: Patient unresponsive. Agonal, shallow respirations. Does not appear to be in pain or distress. Appears comfortable.   GOC:  Multiple family members at bedside including son, [name] who has been primary contact. Ms. Hemler is known to me from recent admission. She was discharged home with family on hospice services and milrinone. Unfortunately ended up in the ER day after discharge due to dyspnea, confusion (pulled out PICC at home). Hospice was going to admit her the following morning.   Spoke with multiple family members at bedside. Frankly and compassionately shared that Ms. Airport appears to have transitioned and is nearing the end of her life, with end-stage heart failure. Recommended against invasive testing (CT) or additional aggressive medical management since this unfortunately will not change the outcome with terminal heart failure. Most importantly, family emphasizes their wish for her to be comfortable. They do not wish to see her suffer. They decline further invasive testing such as CT or replacement of PICC line.   Discussed comfort focused care plan and addition of IV comfort medications. She currently appears comfortable but explained that we would certainly want to manage pain, dyspnea, air hunger, anxiety, etc if symptoms worsened. Family understands and agrees.   Encouraged discontinuation of milrinone and antibiotics, as these interventions may be life-prolonging  (also explaining that she could pass on the milrinone). Family wishes to continue milrinone and antibiotics for at least 24 hours and consider discontinuation of them tomorrow.   Discussed poor prognosis and prepared family for 'anything to happen at any time.' They understand she has transitioned and is nearing the end of her life. Encouraged ongoing family visits.   Answered questions and concerns. Emotional/spiritual support provided.    Length of Stay: 1  Current Medications: Scheduled Meds:  . amiodarone  200 mg Oral Daily  . aspirin EC  81 mg Oral Daily  . budesonide (PULMICORT) nebulizer solution  0.25 mg Nebulization BID  . clopidogrel  75 mg Oral Daily  . docusate sodium  100 mg Oral Daily  . enoxaparin (  LOVENOX) injection  30 mg Subcutaneous Q24H  . feeding supplement (ENSURE ENLIVE)  237 mL Oral TID BM  . furosemide  40 mg Intravenous BID  . gabapentin  200 mg Oral QHS  . isosorbide-hydrALAZINE  1 tablet Oral TID  . multivitamin with minerals  1 tablet Oral Daily  . potassium chloride SA  40 mEq Oral BID  . vancomycin variable dose per unstable renal function (pharmacist dosing)   Does not apply See admin instructions    Continuous Infusions: . ceFEPime (MAXIPIME) IV    . milrinone 0.25 mcg/kg/min (07/09/2019 1026)    PRN Meds: albuterol, polyethylene glycol  Physical Exam Vitals and nursing note reviewed.  Constitutional:      Appearance: She is ill-appearing.     Comments: unresponsive  HENT:     Head: Normocephalic and atraumatic.  Cardiovascular:     Rate and Rhythm: Normal rate.     Heart sounds: Murmur present.  Pulmonary:     Effort: No tachypnea, accessory muscle usage or respiratory distress.     Breath sounds: Normal breath sounds.     Comments: Shallow, regular respirations.  Abdominal:     Tenderness: There is no abdominal tenderness.  Skin:    General: Skin is warm and dry.  Neurological:     Mental Status: She is unresponsive.     Comments:  Appears to be actively dying            Vital Signs: BP 125/61   Pulse (!) 115   Temp (!) 100.6 F (38.1 C) (Oral)   Resp 20   Wt 61.9 kg   SpO2 93%   BMI 17.76 kg/m  SpO2: SpO2: 93 % O2 Device: O2 Device: Nasal Cannula O2 Flow Rate: O2 Flow Rate (L/min): 4 L/min  Intake/output summary:   Intake/Output Summary (Last 24 hours) at 07/11/2019 1003 Last data filed at Jul 11, 2019 0900 Gross per 24 hour  Intake 100 ml  Output --  Net 100 ml   LBM:   Baseline Weight: Weight: 61.9 kg Most recent weight: Weight: 61.9 kg       Palliative Assessment/Data: PPS 10%      Patient Active Problem List   Diagnosis Date Noted  . Heart failure ([place]) 07/15/2019  . Anxiety state   . CHF (congestive heart failure) ([place]) 06/23/2019  . CKD (chronic kidney disease), stage III 06/22/2019  . Palliative care patient 06/22/2019  . Pressure injury of skin 05/31/2019  . Acute renal failure with acute tubular necrosis superimposed on stage 3a chronic kidney disease ([place])   . Acute on chronic combined systolic and diastolic heart failure ([place])   . Terminal care   . NSTEMI (non-ST elevated myocardial infarction) ([place])   . Palliative care by specialist   . Protein-calorie malnutrition, severe 05/19/2019  . AMS (altered mental status) 05/18/2019  . Cardiogenic shock ([place]) 05/18/2019  . AKI (acute kidney injury) ([place])   . Dyspnea 04/15/2019  . Thoracic aortic aneurysm without rupture ([place]) 09/03/2018  . Viral upper respiratory illness 03/16/2018  . Hypokalemia 02/12/2018  . Claudication ([place]) 11/21/2017  . Seasonal allergies 07/28/2017  . Bilateral sciatica 07/02/2017  . Bilateral hearing loss due to cerumen impaction 07/02/2017  . Biventricular heart failure ([place]) 06/05/2017  . Bronchogenic lung cancer, right ([place]) 11/29/2016  . Dysphagia 08/26/2016  . Health care maintenance 04/17/2016  . Dizziness 04/17/2016  . Decreased visual acuity 01/17/2016  . Depressed mood 07/27/2015  . S/P MVR  (mitral valve replacement) 12/05/2014  .  Moderate aortic regurgitation 10/26/2014  . Renal vascular disease 10/26/2014  . Acute on chronic diastolic congestive heart failure ([place]) 10/21/2014  . Tobacco use 10/21/2014  . Diastolic dysfunction, grade 2 by echo June 2016 10/21/2014  . Carpal tunnel syndrome 10/17/2014  . Severe mitral regurgitation 08/25/2014  . Unstable angina ([place]) 08/22/2014  . Low back pain 07/05/2012  . PVC (premature ventricular contraction) 10/02/2011  . History of angioedema with ACE 2013 06/14/2011  . PVD- s/p multiple proceedures 06/04/2011  . Hyperlipidemia 09/28/2008  . Essential hypertension, benign 09/28/2008  . CAD S/P LAD DES 2009 with 70% ISR 08/24/14 09/28/2008    Palliative Care Assessment & Plan   Patient Profile: 69 y.o. female  with past medical history of end-stage a/c biventricular HF EF 15% on home milrinone, viral cardiomyopathy, CAD s/p CABG, severe PAD s/p left fem-pop , HTN, HLD, MV replacement, s/p resection for bronchogenic lung cancer, tobacco use admitted on 06/22/2019 with shortness of breath and issues with milrinone infusion/PICC line from SNF rehab. Heart failure team following. Patient receiving milrinone infusion and lasix IV BID. Recommending hospice services, as patient is end-stage heart failure. Palliative medicine consultation for goals of care/hospice discussion. Patient discharged home with hospice on 4/2 evening but unfortunately back in the ED prior to hospice admission due to worsening shortness of breath and pulling out PICC line with milrinone infusing. On 4/4 AM, patient unresponsive to sternal rub, worse from yesterday. Known end-stage heart failure on milrinone gtt and started on broad-spectrum antibiotics for possible pneumonia. Palliative medicine re-consulted. Patient known to this NP.   Assessment: End stage A/C biventricular heart failure EF 15% CAD s/p CABG CKD stage IIIb Acute respiratory failure Possible multifocal  pneumonia Severe PAD s/p left fem-pop Lung cancer s/p resection Chronic neuropathic pain  Recommendations/Plan:  Comfort focused care plan. Family does not wish to pursue invasive testing such as CT or replace PICC line. Family understands poor prognosis and wish to focus on her comfort. They are prepared for 'anything to happen at anytime.'  Family requests to continue milrinone infusion and IV antibiotics for at least 24 hours. Encouraged discontinuation of these interventions, as they may be prolonging.   Symptom management medications  Dilaudid 0.5mg  IV q6h prn pain/dyspnea/air hunger/tachypnea  Ativan 0.5mg  IV q6h prn anxiety/agitation  Robinul 0.2mg  IV q4h prn secretions  Haldol 2mg  IV q6h prn agitation  Tylenol suppository prn fever  Liberalize dilaudid frequency if worsening dyspnea/air hunger  Unrestricted visitor access as patient is nearing the end of her life.   Comfort feeds if patient experiences a surge of energy. Aspiration precautions.  Spiritual care consult  Anticipate she may pass inpatient. Will further discuss residential hospice facility if appropriate.   Code Status: DNR   Code Status Orders  (From admission, onward)         Start     Ordered   06/22/19 1549  Do not attempt resuscitation (DNR)  Continuous    Question Answer Comment  In the event of cardiac or respiratory ARREST Do not call a "code blue"   In the event of cardiac or respiratory ARREST Do not perform Intubation, CPR, defibrillation or ACLS   In the event of cardiac or respiratory ARREST Use medication by any route, position, wound care, and other measures to relive pain and suffering. May use oxygen, suction and manual treatment of airway obstruction as needed for comfort.      06/22/19 1548        Code Status History  Date Active Date Inactive Code Status Order ID Comments User Context   05/25/2019 1636 06/04/2019 0036 DNR [id number]  Pershing Proud, NP Inpatient    05/18/2019 2101 05/25/2019 1636 Full Code [id number]  Reola Mosher Inpatient   05/18/2019 1852 05/18/2019 2101 Full Code [id number]  Nuala Alpha, DO ED   09/16/2018 1044 09/16/2018 1542 Full Code [id number]  Marty Heck, MD Inpatient   02/04/2018 1401 02/04/2018 2006 Full Code [id number]  Marty Heck, MD Inpatient   06/05/2017 2007 06/07/2017 1731 Full Code [id number]  Tommie Raymond, NP ED   11/29/2016 1707 12/03/2016 1558 Full Code [id number]  John Giovanni, PA-C Inpatient   12/05/2014 1426 12/08/2014 0922 Full Code [id number]  Coolidge Breeze, PA-C Inpatient   11/28/2014 1337 11/30/2014 1921 Full Code [id number]  Archie Patten, MD Inpatient   10/26/2014 1522 10/28/2014 1941 Full Code [id number]  Martin[place], Peter M, MD Inpatient   10/21/2014 1501 10/26/2014 1522 Full Code [id number]  Erlene Quan, PA-C ED   08/24/2014 1414 08/25/2014 2102 Full Code [id number]  Wellington Hampshire, MD Inpatient   08/22/2014 1509 08/24/2014 1414 Full Code [id number]  Dorothy Spark, MD ED   Advance Care Planning Activity    Advance Directive Documentation     Most Recent Value  Type of Advance Directive  Out of facility DNR (pink MOST or yellow form), Living will  Pre-existing out of facility DNR order (yellow form or pink MOST form)  --  "MOST" Form in Place?  --       Prognosis:   Poor prognosis with end-stage biventricular heart failure. Possibly hours-days  Discharge Planning:  To Be Determined  Care plan was discussed with RN, Dr. Tarry Kos, Dr. Lavonia Drafts, [name]), multiple family members at bedside  Thank you for allowing the Palliative Medicine Team to assist in the care of this patient.   Time In: 1200 Time Out: 1240 Total Time 40 Prolonged Time Billed no      Greater than 50%  of this time was spent counseling and coordinating care related to the above assessment and plan.  Ihor Dow, DNP, FNP-C Palliative Medicine Team  Phone: [phone] Fax: [phone]  Please contact  Palliative Medicine Team phone at [phone] for questions and concerns.

## 2019-07-24 NOTE — Progress Notes (Signed)
FPTS Interim Progress Note  S: Patient unresponsive this morning with minimal response to sternal rub (opens eyes only).  This appears to be worsening since yesterday per H&P as it appears she was lethargic but arousable on admission yesterday.  She is known to be at end-stage heart failure on milrinone drip and was discharged home approximately 24 hours ago with hospice prior to being re-admitted yesterday.  She did have a fever of 100.6 this morning but is currently on broad-spectrum antibiotics for possible pneumonia  Blood pressure normotensive.  Tachycardic to 115.  CBG 82.  EKG with sinus tachycardia and occasional PVCs which is around her baseline.  Patient currently on vancomycin and cefepime for possible pneumonia. Satting well on 4L O2 with no increased work of breathing.  Last sedating medication (oxycodone, Xanax) was given at 4 PM yesterday.  Patient is palliative. GCS of 4 (E2, M1, V1).   O: BP 125/61   Pulse (!) 115   Temp (!) 100.6 F (38.1 C) (Oral)   Resp 20   Wt 61.9 kg   SpO2 93%   BMI 17.76 kg/m   General: Cachectic female, appears older than stated age, unresponsive CV:  Tachycardic, irregular rhythm, 2+ radial and pedal pulses Lungs: Crackles in left lower lobe appreciated, no increased work of breathing on 4L O2  Abdomen: soft, non-tender, non-distended, normoactive bowel sounds Skin: warm, dry Extremities: warm and well perfused Neuro: unresponsive,  opens eyes to sternal rub only, slow pupil response bilaterally, unresponsive to pain  A/P: Acutely worsening.  Unresponsive this morning.  Unclear etiology, however given that patient is palliative care with poor prognosis, appears patient may be taking normal trajectory towards death.  Work-up thus far unremarkable with normal vitals.  CBG within normal limits.  EKG that appears to be at baseline.  Unlikely sedating medication contributing however she may be up slow metabolizer.  BMP pending. Considered ABG and brain  imaging however given unlikely to treat any potential pathology we have opted to hold off at this time.  Currently on broad-spectrum antibiotics for possible pneumonia that should treat any other underlying infection.  Discussed with family and they will be here shortly to be with patient.  Also discussed with palliative who will see patient around 30 AM and discuss further with family.   -Follow-up labs -Family to be with patient -Follow-up palliative discussion -Discontinue oxycodone, Xanax until further notice -Continue antibiotics   Danna Hefty, DO 2019/07/08, 9:28 AM PGY-2, [place] Medicine Service pager [phone]

## 2019-07-24 NOTE — Progress Notes (Addendum)
Family Medicine Teaching Service Daily Progress Note Intern Pager: [phone]  Patient name: Leah [name] [facility] Medical record number: [id number] Date of birth: 01-27-1951 Age: 69 y.o. Gender: female  Primary Care Provider: Guadalupe Dawn, MD Consultants: Palliative, Cards (curbside) Code Status: DNR  Pt Overview and Major Events to Date:  3/4 Admitted  Assessment and Plan: Leah [name] is a 69 y.o. female presenting with worsening shortness of breath after PICC line infusing milrinone accidentally pulled out. PMH is significant for viral cardiomyopathy, CAD, HTN, HLD, PVD, HFrEF (15%), CKD, MV replacement, status post resection for bronchogenic lung cancer, tobacco use.  Shortness of breath  end-stage biventricular heart failure on milrinone drip with EF less than 20% v. Multifocal PNA Is not responding to name or stimuli. Pupils equal yet slowly reactive. 93% sat on 4L O2. Tmax 100.6. S/p starting cefepime, vancomycin. -HF, no further recommendations at this time -Palliative Consult: Full comfort care, family will decided whether to stop antibiotic and milrinone tomorrow; palliative will place order for IV comfort medications -Cefepime 2 gm (04/03-) -Vancomycin 1.5 gm (04/03-) -Lasix 40 mg IV PRN  -Continue Milrinone infusion -Xanax 0.25mg  TID PRN anxiety  -Oxycodone 5mg  q 4 PRN for air hunger, pain, can give morphine IV if needed as well    AKI on CKD III:  Creatinine 2.61 on admission.  Baseline from (02/21) 1.6-2.0.  Family reports little po intake since discharge from hospital. Likely due to hypervolemic state.  PAD Stable   Frequent PVCs Well-controlled with amiodarone. Will not continue amiodarone at this time.  FEN/GI:  -NPO  Disposition: Home with hospice pending medical stability  Subjective:  Pt. Non responsive to painful stimuli (lab stick). Nurse could not arouse patient.   Objective: Temp:  [92.5 F (33.6 C)-98.2 F (36.8 C)] 98 F (36.7 C)  (04/04 0400) Pulse Rate:  [83-115] 115 (04/04 0500) Resp:  [13-29] 20 (04/04 0500) BP: (110-135)/(54-74) 125/61 (04/04 0500) SpO2:  [90 %-99 %] 90 % (04/04 0500) Weight:  [61.9 kg] 61.9 kg (04/04 0400)  Physical Exam:  General: Appears unwell. Appears older than state age.  HEENT: Pupils equal, slowly reactive. Cardiac: Irregular rate and rhythm, tachycardic Respiratory: Crackles at lung bases bilaterally, mildly increased effort Extremities: Mild LE edema Skin: Warm and dry Neuro: Non responsive. GCS 3  Laboratory: Recent Labs  Lab 06/23/19 0456 06/23/19 0456 06/24/19 0529 07/01/2019 1003 07/03/2019 1042  WBC 13.1*  --  13.2* 16.6*  --   HGB 9.6*   < > 10.1* 11.6* 12.6  HCT 30.0*   < > 30.9* 37.2 37.0  PLT 116*  --  106* 131*  --    < > = values in this interval not displayed.   Recent Labs  Lab 06/22/19 0828 06/22/19 1739 06/24/19 0529 06/24/19 0529 06/25/19 0407 06/25/2019 1003 07/16/2019 1042  NA 138   < > 137   < > 136 138 135  K 4.0   < > 3.0*   < > 3.1* 5.3* 4.7  CL 107   < > 102  --  101 101  --   CO2 18*   < > 19*  --  22 12*  --   BUN 34*   < > 33*  --  30* 44*  --   CREATININE 2.04*   < > 1.90*  --  1.81* 2.61*  --   CALCIUM 9.3   < > 8.7*  --  8.6* 9.6  --   PROT 5.9*  --   --   --   --   --   --  BILITOT 1.7*  --   --   --   --   --   --   ALKPHOS 118  --   --   --   --   --   --   ALT 40  --   --   --   --   --   --   AST 27  --   --   --   --   --   --   GLUCOSE 96   < > 102*  --  128* 65*  --    < > = values in this interval not displayed.    Imaging/Diagnostic Tests:  PORTABLE CHEST 1 VIEW COMPARISON:  June 22, 2019 IMPRESSION: Multifocal pneumonia with airspace opacity in the right lower lung region and left mid lung regions. Left base atelectasis noted. Postoperative change right upper lobe. Stable cardiomegaly. Status post mitral valve replacement. No adenopathy evident.   Gerlene Fee, DO 07/01/19, 7:26 AM PGY-1, [place] Intern pager: [phone], text pages welcome

## 2019-07-24 NOTE — Progress Notes (Signed)
Spoke with MD re PICC order and status changes.  MD states she will cancel PICC order.

## 2019-07-24 NOTE — Hospital Course (Addendum)
Leah [name] was a 69 y.o. female who presented with worsening shortness of breath after PICC line infusing milrinone accidentally pulled out. PMH is significant for viral cardiomyopathy, CAD, HTN, HLD, PVD, HFrEF (15%), CKD, MV replacement, status post resection for bronchogenic lung cancer, tobacco use. Her hospital course is outlined below.  Patient presented with worsening shortness of breath after 1 hour of milrinone infusion stopped due to accidental pulling out of PICC line, 2nd admit for this, just discharged home with hospice the evening prior to this admission. See H&P for full admission details. Was found to have multifocal pneumonia and  was started on IV Vancomycin and cefepime. She was initially continued on all home medications including IV milrinone. The following morning she was not responsive to painful stimuli. Palliative was consulted and patient's children contacted and brought to bedside. Full comfort care was decided. The patient expired 19-Jul-2019 at 2005.

## 2019-07-24 DEATH — deceased

## 2019-08-02 ENCOUNTER — Ambulatory Visit: Payer: Medicare Other | Admitting: Cardiology
